# Patient Record
Sex: Female | Born: 1935
Health system: Southern US, Community
[De-identification: ages and names within clinical notes are randomized; demographics above are authoritative.]

## PROBLEM LIST (undated history)

## (undated) DIAGNOSIS — H9190 Unspecified hearing loss, unspecified ear: Secondary | ICD-10-CM

## (undated) DIAGNOSIS — R000 Tachycardia, unspecified: Secondary | ICD-10-CM

## (undated) DIAGNOSIS — C50919 Malignant neoplasm of unspecified site of unspecified female breast: Secondary | ICD-10-CM

## (undated) DIAGNOSIS — E559 Vitamin D deficiency, unspecified: Secondary | ICD-10-CM

## (undated) DIAGNOSIS — Z9221 Personal history of antineoplastic chemotherapy: Secondary | ICD-10-CM

## (undated) DIAGNOSIS — F32A Depression, unspecified: Secondary | ICD-10-CM

## (undated) DIAGNOSIS — E039 Hypothyroidism, unspecified: Secondary | ICD-10-CM

## (undated) DIAGNOSIS — Z923 Personal history of irradiation: Secondary | ICD-10-CM

## (undated) DIAGNOSIS — Z87898 Personal history of other specified conditions: Secondary | ICD-10-CM

## (undated) DIAGNOSIS — F0390 Unspecified dementia without behavioral disturbance: Secondary | ICD-10-CM

## (undated) DIAGNOSIS — C8590 Non-Hodgkin lymphoma, unspecified, unspecified site: Secondary | ICD-10-CM

## (undated) DIAGNOSIS — E785 Hyperlipidemia, unspecified: Secondary | ICD-10-CM

## (undated) DIAGNOSIS — Z171 Estrogen receptor negative status [ER-]: Secondary | ICD-10-CM

## (undated) DIAGNOSIS — K5900 Constipation, unspecified: Secondary | ICD-10-CM

## (undated) DIAGNOSIS — M8580 Other specified disorders of bone density and structure, unspecified site: Secondary | ICD-10-CM

## (undated) DIAGNOSIS — M810 Age-related osteoporosis without current pathological fracture: Secondary | ICD-10-CM

## (undated) DIAGNOSIS — F329 Major depressive disorder, single episode, unspecified: Secondary | ICD-10-CM

## (undated) DIAGNOSIS — Z860100 Personal history of colon polyps, unspecified: Secondary | ICD-10-CM

## (undated) DIAGNOSIS — IMO0001 Reserved for inherently not codable concepts without codable children: Secondary | ICD-10-CM

## (undated) DIAGNOSIS — D649 Anemia, unspecified: Secondary | ICD-10-CM

## (undated) DIAGNOSIS — H269 Unspecified cataract: Secondary | ICD-10-CM

## (undated) DIAGNOSIS — M1711 Unilateral primary osteoarthritis, right knee: Secondary | ICD-10-CM

## (undated) DIAGNOSIS — R351 Nocturia: Secondary | ICD-10-CM

## (undated) DIAGNOSIS — C8591 Non-Hodgkin lymphoma, unspecified, lymph nodes of head, face, and neck: Secondary | ICD-10-CM

## (undated) DIAGNOSIS — D693 Immune thrombocytopenic purpura: Secondary | ICD-10-CM

## (undated) DIAGNOSIS — M2550 Pain in unspecified joint: Secondary | ICD-10-CM

## (undated) DIAGNOSIS — Z8659 Personal history of other mental and behavioral disorders: Secondary | ICD-10-CM

## (undated) DIAGNOSIS — M2540 Effusion, unspecified joint: Secondary | ICD-10-CM

## (undated) DIAGNOSIS — I10 Essential (primary) hypertension: Secondary | ICD-10-CM

## (undated) DIAGNOSIS — G629 Polyneuropathy, unspecified: Secondary | ICD-10-CM

## (undated) DIAGNOSIS — J449 Chronic obstructive pulmonary disease, unspecified: Secondary | ICD-10-CM

## (undated) DIAGNOSIS — F419 Anxiety disorder, unspecified: Secondary | ICD-10-CM

## (undated) DIAGNOSIS — K429 Umbilical hernia without obstruction or gangrene: Secondary | ICD-10-CM

## (undated) DIAGNOSIS — I700 Atherosclerosis of aorta: Secondary | ICD-10-CM

## (undated) DIAGNOSIS — K219 Gastro-esophageal reflux disease without esophagitis: Secondary | ICD-10-CM

## (undated) DIAGNOSIS — E079 Disorder of thyroid, unspecified: Secondary | ICD-10-CM

## (undated) HISTORY — PX: BREAST SURGERY: SHX581

## (undated) HISTORY — DX: Hyperlipidemia, unspecified: E78.5

## (undated) HISTORY — PX: TURBINATE REDUCTION: SHX6157

## (undated) HISTORY — DX: Hypothyroidism, unspecified: E03.9

## (undated) HISTORY — PX: OTHER SURGICAL HISTORY: SHX169

## (undated) HISTORY — PX: ADENOIDECTOMY: SUR15

## (undated) HISTORY — PX: FOOT SURGERY: SHX648

## (undated) HISTORY — PX: TONSILLECTOMY: SUR1361

## (undated) HISTORY — PX: UPPER GASTROINTESTINAL ENDOSCOPY: SHX188

## (undated) HISTORY — DX: Umbilical hernia without obstruction or gangrene: K42.9

## (undated) HISTORY — DX: Malignant neoplasm of unspecified site of unspecified female breast: C50.919

## (undated) HISTORY — DX: Unilateral primary osteoarthritis, right knee: M17.11

## (undated) HISTORY — PX: BUNIONECTOMY: SHX129

## (undated) HISTORY — DX: Immune thrombocytopenic purpura: D69.3

## (undated) HISTORY — DX: Anemia, unspecified: D64.9

## (undated) HISTORY — PX: TRANSURETHRAL RESECTION OF BLADDER TUMOR WITH GYRUS (TURBT-GYRUS): SHX6458

## (undated) HISTORY — DX: Estrogen receptor negative status (ER-): Z17.1

## (undated) HISTORY — PX: FRACTURE SURGERY: SHX138

## (undated) HISTORY — PX: COLONOSCOPY: SHX174

## (undated) HISTORY — DX: Non-Hodgkin lymphoma, unspecified, lymph nodes of head, face, and neck: C85.91

## (undated) HISTORY — DX: Polyneuropathy, unspecified: G62.9

## (undated) HISTORY — DX: Vitamin D deficiency, unspecified: E55.9

## (undated) HISTORY — DX: Unspecified dementia, unspecified severity, without behavioral disturbance, psychotic disturbance, mood disturbance, and anxiety: F03.90

## (undated) HISTORY — DX: Atherosclerosis of aorta: I70.0

## (undated) HISTORY — PX: KNEE SURGERY: SHX244

---

## 1992-05-04 HISTORY — PX: BREAST LUMPECTOMY: SHX2

## 2002-06-27 ENCOUNTER — Other Ambulatory Visit: Admission: RE | Admit: 2002-06-27 | Discharge: 2002-06-27 | Payer: Self-pay | Admitting: Obstetrics and Gynecology

## 2002-10-23 ENCOUNTER — Encounter: Payer: Self-pay | Admitting: Oncology

## 2002-10-23 ENCOUNTER — Encounter: Admission: RE | Admit: 2002-10-23 | Discharge: 2002-10-23 | Payer: Self-pay | Admitting: Oncology

## 2003-02-09 ENCOUNTER — Encounter: Payer: Self-pay | Admitting: Oncology

## 2003-02-09 ENCOUNTER — Encounter: Admission: RE | Admit: 2003-02-09 | Discharge: 2003-02-09 | Payer: Self-pay | Admitting: Oncology

## 2003-03-12 ENCOUNTER — Encounter: Admission: RE | Admit: 2003-03-12 | Discharge: 2003-03-12 | Payer: Self-pay | Admitting: Oncology

## 2003-05-18 ENCOUNTER — Ambulatory Visit (HOSPITAL_COMMUNITY): Admission: RE | Admit: 2003-05-18 | Discharge: 2003-05-18 | Payer: Self-pay | Admitting: Internal Medicine

## 2003-08-16 ENCOUNTER — Encounter: Admission: RE | Admit: 2003-08-16 | Discharge: 2003-08-16 | Payer: Self-pay | Admitting: Oncology

## 2003-10-09 ENCOUNTER — Other Ambulatory Visit: Admission: RE | Admit: 2003-10-09 | Discharge: 2003-10-09 | Payer: Self-pay | Admitting: Obstetrics and Gynecology

## 2004-03-13 ENCOUNTER — Encounter: Admission: RE | Admit: 2004-03-13 | Discharge: 2004-03-13 | Payer: Self-pay | Admitting: Oncology

## 2004-03-25 ENCOUNTER — Ambulatory Visit: Payer: Self-pay | Admitting: Oncology

## 2004-05-02 ENCOUNTER — Encounter: Admission: RE | Admit: 2004-05-02 | Discharge: 2004-05-02 | Payer: Self-pay | Admitting: Family Medicine

## 2004-07-01 ENCOUNTER — Ambulatory Visit: Payer: Self-pay | Admitting: Oncology

## 2004-10-06 ENCOUNTER — Ambulatory Visit: Payer: Self-pay | Admitting: Oncology

## 2004-10-21 ENCOUNTER — Ambulatory Visit (HOSPITAL_COMMUNITY): Admission: RE | Admit: 2004-10-21 | Discharge: 2004-10-21 | Payer: Self-pay | Admitting: Oncology

## 2005-02-03 ENCOUNTER — Encounter: Admission: RE | Admit: 2005-02-03 | Discharge: 2005-02-03 | Payer: Self-pay | Admitting: Family Medicine

## 2005-03-16 ENCOUNTER — Encounter: Admission: RE | Admit: 2005-03-16 | Discharge: 2005-03-16 | Payer: Self-pay | Admitting: Oncology

## 2005-04-14 ENCOUNTER — Ambulatory Visit: Payer: Self-pay | Admitting: Oncology

## 2005-10-08 ENCOUNTER — Ambulatory Visit: Payer: Self-pay | Admitting: Oncology

## 2005-10-12 LAB — CBC WITH DIFFERENTIAL/PLATELET
BASO%: 0.3 % (ref 0.0–2.0)
EOS%: 2.4 % (ref 0.0–7.0)
HCT: 37.7 % (ref 34.8–46.6)
LYMPH%: 26.9 % (ref 14.0–48.0)
MCH: 30.1 pg (ref 26.0–34.0)
MCHC: 33.7 g/dL (ref 32.0–36.0)
MCV: 89.3 fL (ref 81.0–101.0)
MONO%: 3.9 % (ref 0.0–13.0)
NEUT%: 66.5 % (ref 39.6–76.8)
Platelets: 74 10*3/uL — ABNORMAL LOW (ref 145–400)
RBC: 4.22 10*6/uL (ref 3.70–5.32)

## 2005-10-12 LAB — COMPREHENSIVE METABOLIC PANEL
ALT: 14 U/L (ref 0–40)
BUN: 12 mg/dL (ref 6–23)
CO2: 23 mEq/L (ref 19–32)
Calcium: 9.5 mg/dL (ref 8.4–10.5)
Chloride: 106 mEq/L (ref 96–112)
Creatinine, Ser: 0.75 mg/dL (ref 0.40–1.20)
Glucose, Bld: 101 mg/dL — ABNORMAL HIGH (ref 70–99)

## 2005-10-12 LAB — LACTATE DEHYDROGENASE: LDH: 154 U/L (ref 94–250)

## 2005-10-15 ENCOUNTER — Ambulatory Visit (HOSPITAL_COMMUNITY): Admission: RE | Admit: 2005-10-15 | Discharge: 2005-10-15 | Payer: Self-pay | Admitting: Oncology

## 2005-12-10 ENCOUNTER — Ambulatory Visit: Payer: Self-pay | Admitting: Oncology

## 2005-12-14 LAB — CBC WITH DIFFERENTIAL/PLATELET
BASO%: 0.2 % (ref 0.0–2.0)
EOS%: 1.9 % (ref 0.0–7.0)
Eosinophils Absolute: 0.1 10*3/uL (ref 0.0–0.5)
MCV: 89.8 fL (ref 81.0–101.0)
MONO%: 4.5 % (ref 0.0–13.0)
NEUT#: 5 10*3/uL (ref 1.5–6.5)
RBC: 4.24 10*6/uL (ref 3.70–5.32)
RDW: 14 % (ref 11.3–14.5)

## 2005-12-14 LAB — MORPHOLOGY: RBC Comments: NORMAL

## 2006-02-05 ENCOUNTER — Ambulatory Visit: Payer: Self-pay | Admitting: Oncology

## 2006-03-17 ENCOUNTER — Encounter: Admission: RE | Admit: 2006-03-17 | Discharge: 2006-03-17 | Payer: Self-pay | Admitting: Oncology

## 2006-04-05 ENCOUNTER — Encounter: Admission: RE | Admit: 2006-04-05 | Discharge: 2006-04-05 | Payer: Self-pay | Admitting: Family Medicine

## 2006-04-09 ENCOUNTER — Ambulatory Visit: Payer: Self-pay | Admitting: Oncology

## 2006-04-13 ENCOUNTER — Ambulatory Visit (HOSPITAL_COMMUNITY): Admission: RE | Admit: 2006-04-13 | Discharge: 2006-04-13 | Payer: Self-pay | Admitting: Oncology

## 2006-04-13 LAB — COMPREHENSIVE METABOLIC PANEL
AST: 19 U/L (ref 0–37)
Albumin: 3.9 g/dL (ref 3.5–5.2)
BUN: 10 mg/dL (ref 6–23)
Calcium: 9.4 mg/dL (ref 8.4–10.5)
Chloride: 104 mEq/L (ref 96–112)
Glucose, Bld: 100 mg/dL — ABNORMAL HIGH (ref 70–99)
Potassium: 3.7 mEq/L (ref 3.5–5.3)

## 2006-04-13 LAB — CBC WITH DIFFERENTIAL/PLATELET
Basophils Absolute: 0 10*3/uL (ref 0.0–0.1)
EOS%: 1.1 % (ref 0.0–7.0)
HCT: 38.6 % (ref 34.8–46.6)
HGB: 13.1 g/dL (ref 11.6–15.9)
LYMPH%: 13.9 % — ABNORMAL LOW (ref 14.0–48.0)
MCH: 30.3 pg (ref 26.0–34.0)
MCV: 89.2 fL (ref 81.0–101.0)
MONO%: 4.1 % (ref 0.0–13.0)
NEUT%: 80.7 % — ABNORMAL HIGH (ref 39.6–76.8)
Platelets: 139 10*3/uL — ABNORMAL LOW (ref 145–400)

## 2006-04-13 LAB — MORPHOLOGY: PLT EST: ADEQUATE

## 2006-07-15 ENCOUNTER — Ambulatory Visit: Payer: Self-pay | Admitting: Oncology

## 2006-07-20 LAB — CBC WITH DIFFERENTIAL/PLATELET
BASO%: 0.5 % (ref 0.0–2.0)
HCT: 36.4 % (ref 34.8–46.6)
MCHC: 34.5 g/dL (ref 32.0–36.0)
MONO#: 0.4 10*3/uL (ref 0.1–0.9)
NEUT#: 3.7 10*3/uL (ref 1.5–6.5)
NEUT%: 61 % (ref 39.6–76.8)
WBC: 6 10*3/uL (ref 3.9–10.0)
lymph#: 1.7 10*3/uL (ref 0.9–3.3)

## 2006-07-20 LAB — MORPHOLOGY

## 2006-09-08 ENCOUNTER — Encounter: Admission: RE | Admit: 2006-09-08 | Discharge: 2006-09-08 | Payer: Self-pay | Admitting: Oncology

## 2006-09-22 ENCOUNTER — Encounter: Admission: RE | Admit: 2006-09-22 | Discharge: 2006-09-22 | Payer: Self-pay | Admitting: Family Medicine

## 2006-10-04 ENCOUNTER — Encounter: Admission: RE | Admit: 2006-10-04 | Discharge: 2006-10-04 | Payer: Self-pay | Admitting: Family Medicine

## 2006-10-07 ENCOUNTER — Ambulatory Visit: Payer: Self-pay | Admitting: Oncology

## 2006-10-12 LAB — CBC WITH DIFFERENTIAL/PLATELET
Eosinophils Absolute: 0.1 10*3/uL (ref 0.0–0.5)
LYMPH%: 23.1 % (ref 14.0–48.0)
MONO#: 0.4 10*3/uL (ref 0.1–0.9)
NEUT#: 8 10*3/uL — ABNORMAL HIGH (ref 1.5–6.5)
Platelets: 267 10*3/uL (ref 145–400)
RBC: 4.34 10*6/uL (ref 3.70–5.32)
WBC: 11.2 10*3/uL — ABNORMAL HIGH (ref 3.9–10.0)
lymph#: 2.6 10*3/uL (ref 0.9–3.3)

## 2006-10-12 LAB — MORPHOLOGY
PLT EST: ADEQUATE
RBC Comments: NORMAL

## 2006-10-12 LAB — COMPREHENSIVE METABOLIC PANEL
ALT: 23 U/L (ref 0–35)
AST: 15 U/L (ref 0–37)
Albumin: 4.5 g/dL (ref 3.5–5.2)
Alkaline Phosphatase: 50 U/L (ref 39–117)
Glucose, Bld: 98 mg/dL (ref 70–99)
Potassium: 4.1 mEq/L (ref 3.5–5.3)
Sodium: 139 mEq/L (ref 135–145)
Total Bilirubin: 1.3 mg/dL — ABNORMAL HIGH (ref 0.3–1.2)
Total Protein: 7.1 g/dL (ref 6.0–8.3)

## 2006-10-13 ENCOUNTER — Ambulatory Visit (HOSPITAL_COMMUNITY): Admission: RE | Admit: 2006-10-13 | Discharge: 2006-10-13 | Payer: Self-pay | Admitting: Oncology

## 2006-10-25 ENCOUNTER — Encounter: Admission: RE | Admit: 2006-10-25 | Discharge: 2006-10-25 | Payer: Self-pay | Admitting: Family Medicine

## 2006-11-23 ENCOUNTER — Encounter: Admission: RE | Admit: 2006-11-23 | Discharge: 2006-11-23 | Payer: Self-pay | Admitting: Family Medicine

## 2006-12-01 ENCOUNTER — Encounter: Admission: RE | Admit: 2006-12-01 | Discharge: 2006-12-01 | Payer: Self-pay | Admitting: Family Medicine

## 2007-03-02 ENCOUNTER — Encounter: Admission: RE | Admit: 2007-03-02 | Discharge: 2007-03-02 | Payer: Self-pay | Admitting: Interventional Radiology

## 2007-04-07 ENCOUNTER — Ambulatory Visit: Payer: Self-pay | Admitting: Oncology

## 2007-04-11 LAB — COMPREHENSIVE METABOLIC PANEL
ALT: 14 U/L (ref 0–35)
Albumin: 4.2 g/dL (ref 3.5–5.2)
CO2: 27 mEq/L (ref 19–32)
Calcium: 10 mg/dL (ref 8.4–10.5)
Chloride: 105 mEq/L (ref 96–112)
Creatinine, Ser: 0.64 mg/dL (ref 0.40–1.20)
Potassium: 4.3 mEq/L (ref 3.5–5.3)
Total Protein: 6.8 g/dL (ref 6.0–8.3)

## 2007-04-11 LAB — LACTATE DEHYDROGENASE: LDH: 146 U/L (ref 94–250)

## 2007-04-11 LAB — CBC WITH DIFFERENTIAL/PLATELET
BASO%: 0.3 % (ref 0.0–2.0)
HCT: 36.6 % (ref 34.8–46.6)
LYMPH%: 27.9 % (ref 14.0–48.0)
MCH: 30.9 pg (ref 26.0–34.0)
MCHC: 34.7 g/dL (ref 32.0–36.0)
MCV: 88.9 fL (ref 81.0–101.0)
MONO%: 5.1 % (ref 0.0–13.0)
NEUT%: 63.9 % (ref 39.6–76.8)
Platelets: 207 10*3/uL (ref 145–400)
RBC: 4.11 10*6/uL (ref 3.70–5.32)

## 2007-04-11 LAB — MORPHOLOGY: PLT EST: ADEQUATE

## 2007-04-13 ENCOUNTER — Ambulatory Visit (HOSPITAL_COMMUNITY): Admission: RE | Admit: 2007-04-13 | Discharge: 2007-04-13 | Payer: Self-pay | Admitting: Oncology

## 2007-07-15 ENCOUNTER — Ambulatory Visit: Payer: Self-pay | Admitting: Oncology

## 2007-09-09 ENCOUNTER — Encounter: Admission: RE | Admit: 2007-09-09 | Discharge: 2007-09-09 | Payer: Self-pay | Admitting: Oncology

## 2007-10-19 ENCOUNTER — Ambulatory Visit: Payer: Self-pay | Admitting: Oncology

## 2007-10-21 LAB — CBC WITH DIFFERENTIAL/PLATELET
Basophils Absolute: 0 10*3/uL (ref 0.0–0.1)
EOS%: 2.3 % (ref 0.0–7.0)
Eosinophils Absolute: 0.2 10*3/uL (ref 0.0–0.5)
HCT: 36.8 % (ref 34.8–46.6)
HGB: 12.7 g/dL (ref 11.6–15.9)
MCH: 30.9 pg (ref 26.0–34.0)
MCV: 89.9 fL (ref 81.0–101.0)
MONO%: 5.3 % (ref 0.0–13.0)
NEUT#: 4.9 10*3/uL (ref 1.5–6.5)
NEUT%: 64.2 % (ref 39.6–76.8)
RDW: 13 % (ref 11.3–14.5)
lymph#: 2.1 10*3/uL (ref 0.9–3.3)

## 2007-10-21 LAB — COMPREHENSIVE METABOLIC PANEL
ALT: 16 U/L (ref 0–35)
AST: 13 U/L (ref 0–37)
Albumin: 4.1 g/dL (ref 3.5–5.2)
CO2: 24 mEq/L (ref 19–32)
Calcium: 9.9 mg/dL (ref 8.4–10.5)
Chloride: 108 mEq/L (ref 96–112)
Creatinine, Ser: 0.59 mg/dL (ref 0.40–1.20)
Potassium: 4.5 mEq/L (ref 3.5–5.3)

## 2007-10-21 LAB — MORPHOLOGY: PLT EST: ADEQUATE

## 2007-10-21 LAB — LACTATE DEHYDROGENASE: LDH: 144 U/L (ref 94–250)

## 2008-02-22 ENCOUNTER — Ambulatory Visit: Payer: Self-pay | Admitting: Oncology

## 2008-02-24 LAB — COMPREHENSIVE METABOLIC PANEL
AST: 14 U/L (ref 0–37)
Alkaline Phosphatase: 55 U/L (ref 39–117)
Creatinine, Ser: 0.71 mg/dL (ref 0.40–1.20)
Glucose, Bld: 115 mg/dL — ABNORMAL HIGH (ref 70–99)
Potassium: 4.2 mEq/L (ref 3.5–5.3)
Total Bilirubin: 0.8 mg/dL (ref 0.3–1.2)
Total Protein: 6.5 g/dL (ref 6.0–8.3)

## 2008-02-24 LAB — CBC WITH DIFFERENTIAL/PLATELET
Basophils Absolute: 0 10*3/uL (ref 0.0–0.1)
EOS%: 2.8 % (ref 0.0–7.0)
HCT: 36.4 % (ref 34.8–46.6)
HGB: 12.4 g/dL (ref 11.6–15.9)
MCH: 31.1 pg (ref 26.0–34.0)
MCHC: 34.2 g/dL (ref 32.0–36.0)
MCV: 90.9 fL (ref 81.0–101.0)
MONO#: 0.5 10*3/uL (ref 0.1–0.9)
NEUT#: 5.1 10*3/uL (ref 1.5–6.5)
WBC: 8.1 10*3/uL (ref 3.9–10.0)

## 2008-06-22 ENCOUNTER — Ambulatory Visit: Payer: Self-pay | Admitting: Oncology

## 2008-06-26 LAB — COMPREHENSIVE METABOLIC PANEL
ALT: 14 U/L (ref 0–35)
Alkaline Phosphatase: 53 U/L (ref 39–117)
Chloride: 107 mEq/L (ref 96–112)
Creatinine, Ser: 0.64 mg/dL (ref 0.40–1.20)
Glucose, Bld: 104 mg/dL — ABNORMAL HIGH (ref 70–99)
Potassium: 4.5 mEq/L (ref 3.5–5.3)
Total Bilirubin: 0.7 mg/dL (ref 0.3–1.2)

## 2008-06-26 LAB — CBC WITH DIFFERENTIAL/PLATELET
BASO%: 0.4 % (ref 0.0–2.0)
Basophils Absolute: 0 10*3/uL (ref 0.0–0.1)
EOS%: 2.2 % (ref 0.0–7.0)
Eosinophils Absolute: 0.2 10*3/uL (ref 0.0–0.5)
LYMPH%: 26.9 % (ref 14.0–49.7)
MONO#: 0.4 10*3/uL (ref 0.1–0.9)
MONO%: 4.3 % (ref 0.0–14.0)

## 2008-06-26 LAB — LACTATE DEHYDROGENASE: LDH: 153 U/L (ref 94–250)

## 2008-09-10 ENCOUNTER — Encounter: Admission: RE | Admit: 2008-09-10 | Discharge: 2008-09-10 | Payer: Self-pay | Admitting: Oncology

## 2009-01-01 ENCOUNTER — Ambulatory Visit: Payer: Self-pay | Admitting: Oncology

## 2009-01-03 LAB — CBC WITH DIFFERENTIAL/PLATELET
HCT: 37.6 % (ref 34.8–46.6)
HGB: 12.8 g/dL (ref 11.6–15.9)
MCH: 31 pg (ref 25.1–34.0)
MCV: 91.3 fL (ref 79.5–101.0)
MONO#: 0.3 10*3/uL (ref 0.1–0.9)
MONO%: 4.2 % (ref 0.0–14.0)
WBC: 6.6 10*3/uL (ref 3.9–10.3)
lymph#: 2 10*3/uL (ref 0.9–3.3)

## 2009-06-28 ENCOUNTER — Ambulatory Visit: Payer: Self-pay | Admitting: Oncology

## 2009-07-02 LAB — CBC WITH DIFFERENTIAL/PLATELET
EOS%: 2.6 % (ref 0.0–7.0)
Eosinophils Absolute: 0.2 10*3/uL (ref 0.0–0.5)
HCT: 37.9 % (ref 34.8–46.6)
LYMPH%: 29.3 % (ref 14.0–49.7)
MCHC: 33 g/dL (ref 31.5–36.0)
MONO%: 6.5 % (ref 0.0–14.0)
NEUT%: 61.1 % (ref 38.4–76.8)
Platelets: 218 10*3/uL (ref 145–400)
RDW: 13.3 % (ref 11.2–14.5)
WBC: 8.1 10*3/uL (ref 3.9–10.3)
nRBC: 0 % (ref 0–0)

## 2009-07-30 ENCOUNTER — Encounter: Admission: RE | Admit: 2009-07-30 | Discharge: 2009-07-30 | Payer: Self-pay | Admitting: Obstetrics and Gynecology

## 2009-09-12 ENCOUNTER — Encounter: Admission: RE | Admit: 2009-09-12 | Discharge: 2009-09-12 | Payer: Self-pay | Admitting: Oncology

## 2009-09-24 ENCOUNTER — Ambulatory Visit (HOSPITAL_COMMUNITY): Admission: RE | Admit: 2009-09-24 | Discharge: 2009-09-24 | Payer: Self-pay | Admitting: Obstetrics and Gynecology

## 2010-07-01 ENCOUNTER — Other Ambulatory Visit: Payer: Self-pay | Admitting: Oncology

## 2010-07-01 ENCOUNTER — Encounter (HOSPITAL_BASED_OUTPATIENT_CLINIC_OR_DEPARTMENT_OTHER): Payer: Medicare Other | Admitting: Oncology

## 2010-07-01 DIAGNOSIS — C8291 Follicular lymphoma, unspecified, lymph nodes of head, face, and neck: Secondary | ICD-10-CM

## 2010-07-01 DIAGNOSIS — Z853 Personal history of malignant neoplasm of breast: Secondary | ICD-10-CM

## 2010-07-01 DIAGNOSIS — D693 Immune thrombocytopenic purpura: Secondary | ICD-10-CM

## 2010-07-01 LAB — CBC WITH DIFFERENTIAL/PLATELET
Basophils Absolute: 0 10*3/uL (ref 0.0–0.1)
EOS%: 2.5 % (ref 0.0–7.0)
HCT: 36 % (ref 34.8–46.6)
HGB: 12.3 g/dL (ref 11.6–15.9)
LYMPH%: 23.9 % (ref 14.0–49.7)
MCHC: 34.2 g/dL (ref 31.5–36.0)
NEUT#: 5.4 10*3/uL (ref 1.5–6.5)
RBC: 4.07 10*6/uL (ref 3.70–5.45)
lymph#: 1.9 10*3/uL (ref 0.9–3.3)

## 2010-07-01 LAB — COMPREHENSIVE METABOLIC PANEL
Alkaline Phosphatase: 48 U/L (ref 39–117)
Calcium: 9.8 mg/dL (ref 8.4–10.5)
Creatinine, Ser: 0.59 mg/dL (ref 0.40–1.20)
Glucose, Bld: 102 mg/dL — ABNORMAL HIGH (ref 70–99)
Potassium: 3.8 mEq/L (ref 3.5–5.3)
Sodium: 139 mEq/L (ref 135–145)
Total Protein: 6.5 g/dL (ref 6.0–8.3)

## 2010-07-08 ENCOUNTER — Encounter (HOSPITAL_BASED_OUTPATIENT_CLINIC_OR_DEPARTMENT_OTHER): Payer: Medicare Other | Admitting: Oncology

## 2010-07-08 ENCOUNTER — Other Ambulatory Visit: Payer: Self-pay | Admitting: Oncology

## 2010-07-08 DIAGNOSIS — C8291 Follicular lymphoma, unspecified, lymph nodes of head, face, and neck: Secondary | ICD-10-CM

## 2010-07-08 DIAGNOSIS — Z853 Personal history of malignant neoplasm of breast: Secondary | ICD-10-CM

## 2010-07-08 DIAGNOSIS — Z1231 Encounter for screening mammogram for malignant neoplasm of breast: Secondary | ICD-10-CM

## 2010-07-08 DIAGNOSIS — D693 Immune thrombocytopenic purpura: Secondary | ICD-10-CM

## 2010-09-18 ENCOUNTER — Ambulatory Visit
Admission: RE | Admit: 2010-09-18 | Discharge: 2010-09-18 | Disposition: A | Discharge status: Home / Self Care | Payer: Medicare Other | Source: Ambulatory Visit | Attending: Oncology | Admitting: Oncology

## 2010-09-18 DIAGNOSIS — Z1231 Encounter for screening mammogram for malignant neoplasm of breast: Secondary | ICD-10-CM

## 2010-10-28 ENCOUNTER — Ambulatory Visit (HOSPITAL_COMMUNITY): Payer: Medicare Other | Attending: Obstetrics

## 2010-10-28 DIAGNOSIS — M810 Age-related osteoporosis without current pathological fracture: Secondary | ICD-10-CM | POA: Insufficient documentation

## 2011-04-13 ENCOUNTER — Encounter: Payer: Self-pay | Admitting: *Deleted

## 2011-04-13 ENCOUNTER — Emergency Department (HOSPITAL_COMMUNITY): Payer: Medicare Other

## 2011-04-13 ENCOUNTER — Emergency Department (HOSPITAL_COMMUNITY)
Admission: EM | Admit: 2011-04-13 | Discharge: 2011-04-13 | Disposition: A | Discharge status: Home / Self Care | Payer: Medicare Other | Attending: Emergency Medicine | Admitting: Emergency Medicine

## 2011-04-13 ENCOUNTER — Other Ambulatory Visit: Payer: Self-pay

## 2011-04-13 DIAGNOSIS — R0602 Shortness of breath: Secondary | ICD-10-CM | POA: Insufficient documentation

## 2011-04-13 DIAGNOSIS — Z7982 Long term (current) use of aspirin: Secondary | ICD-10-CM | POA: Insufficient documentation

## 2011-04-13 DIAGNOSIS — R079 Chest pain, unspecified: Secondary | ICD-10-CM | POA: Insufficient documentation

## 2011-04-13 DIAGNOSIS — M542 Cervicalgia: Secondary | ICD-10-CM | POA: Insufficient documentation

## 2011-04-13 DIAGNOSIS — Z79899 Other long term (current) drug therapy: Secondary | ICD-10-CM | POA: Insufficient documentation

## 2011-04-13 DIAGNOSIS — K209 Esophagitis, unspecified without bleeding: Secondary | ICD-10-CM | POA: Insufficient documentation

## 2011-04-13 DIAGNOSIS — Z853 Personal history of malignant neoplasm of breast: Secondary | ICD-10-CM | POA: Insufficient documentation

## 2011-04-13 HISTORY — DX: Disorder of thyroid, unspecified: E07.9

## 2011-04-13 HISTORY — DX: Non-Hodgkin lymphoma, unspecified, unspecified site: C85.90

## 2011-04-13 HISTORY — DX: Malignant neoplasm of unspecified site of unspecified female breast: C50.919

## 2011-04-13 LAB — CBC
HCT: 37 % (ref 36.0–46.0)
Hemoglobin: 12.3 g/dL (ref 12.0–15.0)
MCH: 30 pg (ref 26.0–34.0)
MCV: 90.2 fL (ref 78.0–100.0)
RBC: 4.1 MIL/uL (ref 3.87–5.11)
WBC: 7.9 10*3/uL (ref 4.0–10.5)

## 2011-04-13 LAB — COMPREHENSIVE METABOLIC PANEL
ALT: 15 U/L (ref 0–35)
BUN: 18 mg/dL (ref 6–23)
CO2: 25 mEq/L (ref 19–32)
Calcium: 10.4 mg/dL (ref 8.4–10.5)
Creatinine, Ser: 0.59 mg/dL (ref 0.50–1.10)
GFR calc Af Amer: >90 mL/min (ref >90)
GFR calc non Af Amer: 88 mL/min — ABNORMAL LOW (ref >90)
Glucose, Bld: 104 mg/dL — ABNORMAL HIGH (ref 70–99)
Sodium: 139 mEq/L (ref 135–145)

## 2011-04-13 LAB — APTT: aPTT: 25 seconds (ref 24–37)

## 2011-04-13 LAB — DIFFERENTIAL
Eosinophils Relative: 3 % (ref 0–5)
Lymphocytes Relative: 34 % (ref 12–46)
Lymphs Abs: 2.7 10*3/uL (ref 0.7–4.0)
Monocytes Absolute: 0.4 10*3/uL (ref 0.1–1.0)
Monocytes Relative: 6 % (ref 3–12)

## 2011-04-13 LAB — TROPONIN I: Troponin I: <0.3 ng/mL (ref <0.30)

## 2011-04-13 LAB — CK TOTAL AND CKMB (NOT AT ARMC): CK, MB: 3.6 ng/mL (ref 0.3–4.0)

## 2011-04-13 MED ORDER — IOHEXOL 350 MG/ML SOLN
100.0000 mL | Freq: Once | INTRAVENOUS | Status: AC | PRN
  Administered 2011-04-13: 100 mL via INTRAVENOUS

## 2011-04-13 MED ORDER — NITROGLYCERIN 2 % TD OINT
1.0000 [in_us] | TOPICAL_OINTMENT | Freq: Four times a day (QID) | TRANSDERMAL | Status: DC
  Filled 2011-04-13: qty 1

## 2011-04-13 MED ORDER — SODIUM CHLORIDE 0.9 % IV SOLN
20.0000 mL | INTRAVENOUS | Status: DC
  Administered 2011-04-13: 1000 mL via INTRAVENOUS

## 2011-04-13 MED ORDER — SUCRALFATE 1 G PO TABS: 1.0000 g | ORAL_TABLET | Freq: Four times a day (QID) | ORAL | Status: DC

## 2011-04-13 MED ORDER — ASPIRIN 81 MG PO CHEW
324.0000 mg | CHEWABLE_TABLET | Freq: Once | ORAL | Status: AC
  Administered 2011-04-13: 324 mg via ORAL
  Filled 2011-04-13: qty 4

## 2011-04-13 NOTE — ED Provider Notes (Signed)
History     CSN: [id number] Arrival date & time: 04/13/2011  7:59 PM   First MD Initiated Contact with Patient 04/13/11 2035      Chief Complaint  Patient presents with  . Chest Pain   HPI Patient presents to the emergency room with complaints of chest discomfort. Patient states she had surgery on her foot on Friday. She did notice after that she had some type of reaction to the medication she developed some facial swelling. That has resolved but she has noticed she has had a discomfort in her chest and neck. She feels as if there is a constriction  around her neck as if she is wearing a turtle neck. The patient does feel a little bit short of breath. She has not noticed any significant leg swelling. The discomfort does not radiate anywhere. It has been somewhat constant since yesterday. Patient does not have history of heart disease or pulmonary embolism. Past Medical History  Diagnosis Date  . Thyroid disease   . Cancer of breast   . Lymphoma     History reviewed. No pertinent past surgical history.  History reviewed. No pertinent family history.  History  Substance Use Topics  . Smoking status: Former Games developer  . Smokeless tobacco: Not on file  . Alcohol Use: Yes    OB History    Grav Para Term Preterm Abortions TAB SAB Ect Mult Living                  Review of Systems  All other systems reviewed and are negative.    Allergies  Codeine; Doxycycline; and Sulfa antibiotics  Home Medications   Current Outpatient Rx  Name Route Sig Dispense Refill  . ACETAMINOPHEN 500 MG PO TABS Oral Take 500 mg by mouth every 6 (six) hours as needed. For pain     . ASPIRIN 81 MG PO CHEW Oral Chew 81 mg by mouth daily.      Marland Kitchen CALCIUM CARBONATE-VITAMIN D 600-200 MG-UNIT PO TABS Oral Take 1 tablet by mouth daily.      Marland Kitchen VITAMIN D 1000 UNITS PO TABS Oral Take 1,000 Units by mouth daily.      Marland Kitchen CITALOPRAM HYDROBROMIDE 10 MG PO TABS Oral Take 10 mg by mouth daily.      .  CYCLOBENZAPRINE HCL 5 MG PO TABS Oral Take 5 mg by mouth daily as needed. For pain     . ESOMEPRAZOLE MAGNESIUM 40 MG PO CPDR Oral Take 40 mg by mouth daily before breakfast.      . LEVOTHYROXINE SODIUM 75 MCG PO TABS Oral Take 75 mcg by mouth daily.      Marland Kitchen LOVASTATIN 20 MG PO TABS Oral Take 20 mg by mouth at bedtime.      . MELOXICAM 7.5 MG PO TABS Oral Take 7.5 mg by mouth daily. For pain     . METOPROLOL TARTRATE 25 MG PO TABS Oral Take 25 mg by mouth 2 (two) times daily.      Carma Leaven M PLUS PO TABS Oral Take 1 tablet by mouth daily.      Marland Kitchen VITAMIN D (ERGOCALCIFEROL) 50000 UNITS PO CAPS Oral Take 50,000 Units by mouth every 14 (fourteen) days.      Marland Kitchen MEPERIDINE HCL 50 MG PO TABS Oral Take 50 mg by mouth every 4 (four) hours as needed. For pain     . PROMETHAZINE HCL 25 MG PO TABS Oral Take 25 mg by mouth every 6 (six)  hours as needed. For nausea       BP 115/69  Pulse 60  Temp(Src) 97.8 F (36.6 C) (Oral)  Resp 20  SpO2 95%  Physical Exam  Nursing note and vitals reviewed. Constitutional: She appears well-developed and well-nourished. No distress.  HENT:  Head: Normocephalic and atraumatic.  Right Ear: External ear normal.  Left Ear: External ear normal.  Eyes: Conjunctivae are normal. Right eye exhibits no discharge. Left eye exhibits no discharge. No scleral icterus.  Neck: Neck supple. No tracheal deviation present.  Cardiovascular: Normal rate, regular rhythm and intact distal pulses.   Pulmonary/Chest: Effort normal and breath sounds normal. No stridor. No respiratory distress. She has no wheezes. She has no rales.  Abdominal: Soft. Bowel sounds are normal. She exhibits no distension. There is no tenderness. There is no rebound and no guarding.  Musculoskeletal: She exhibits no edema and no tenderness.  Neurological: She is alert. She has normal strength. No sensory deficit. Cranial nerve deficit:  no gross defecits noted. She exhibits normal muscle tone. She displays no  seizure activity. Coordination normal.  Skin: Skin is warm and dry. No rash noted.  Psychiatric: She has a normal mood and affect.    ED Course  Procedures (including critical care time)  Date: 04/13/2011  Rate: 64  Rhythm: normal sinus rhythm  QRS Axis: normal  Intervals: normal  ST/T Wave abnormalities: normal  Conduction Disutrbances:none  Narrative Interpretation:   Old EKG Reviewed: none available   Labs Reviewed  COMPREHENSIVE METABOLIC PANEL - Abnormal; Notable for the following:    Glucose, Bld 104 (*)    GFR calc non Af Amer 88 (*)    All other components within normal limits  CBC  DIFFERENTIAL  TROPONIN I  CK TOTAL AND CKMB  PROTIME-INR  APTT  I-STAT TROPONIN I  I-STAT TROPONIN I  I-STAT TROPONIN I   Ct Angio Chest W/cm &/or Wo Cm  04/13/2011  *RADIOLOGY REPORT*  Clinical Data:  Chest pressure, pain and tightness in the neck for 2 days.  The patient had foot surgery on Friday.  CT ANGIOGRAPHY CHEST WITH CONTRAST  Technique:  Multidetector CT imaging of the chest was performed using the standard protocol during bolus administration of intravenous contrast.  Multiplanar CT image reconstructions including MIPs were obtained to evaluate the vascular anatomy.  Contrast: OMNIPAQUE IOHEXOL 350 MG/ML IV SOLN  Comparison:  04/13/2007  Findings:  Technically adequate study with good opacification of the central and segmental pulmonary arteries.  No focal filling defects demonstrated.  No evidence of significant pulmonary embolus.  Normal caliber thoracic aorta.  Calcification in the coronary arteries.  Prominent heart size.  Small esophageal hiatal hernia with mild wall thickening in the esophagus which might represent inflammatory changes of reflux.  There are scattered bilateral supraclavicular, mediastinal, and retrocrural lymph nodes which are mildly enlarged.  Largest node measures about 12 mm diameter.  These changes have been present previously and may represent  chronic reactive nodes.  Surgical clips in the left axilla.  No pleural effusion.  No pneumothorax.  Dependent atelectasis in the lung bases.  No focal airspace consolidation. No interstitial changes.  No changes in the thoracic spine.  Review of the MIP images confirms the above findings.  IMPRESSION: No evidence of significant pulmonary embolus.  Suggestion of inflammatory changes in the esophagus.  Scattered supraclavicular, mediastinal, and retrocrural lymph nodes are mildly enlarged but appear stable since previous study.  Original Report Authenticated By: Marlon Pel, M.D.  Dg Chest Portable 1 View  04/13/2011  *RADIOLOGY REPORT*  Clinical Data: Chest pain  PORTABLE CHEST - 1 VIEW  Comparison: CT chest 10/13/2006  Findings: Mild cardiomegaly.  Tortuous thoracic aorta.  Pulmonary vascularity within normal limits.  Bilateral peribronchial thickening most prominent at the lung bases.  No consolidation or edema.  No visible pleural effusion or pneumothorax.  Surgical clips project over the right axillary region.  No acute bony abnormality identified.  IMPRESSION:  1.  Cardiomegaly without edema. 2.  Bilateral peribronchial thickening is nonspecific but can be seen in the setting of acute or chronic bronchitis.  Original Report Authenticated By: Britta Mccreedy, M.D.     1. Esophagitis       MDM  Patient has had discomfort for the last couple of days. At this point acute cardiac etiology is unlikely. She had days of symptoms and has had negative cardiac enzymes. Her EKG is reassuring. Her CT scan of the chest does  not show any signs of pulmonary embolus or dissection. she does have suggestion of inflammatory changes in the esophagus. I suspect esophagitis as the etiology of her symptoms. She does take Nexium every other day. I've instructed her to take Nexium daily and will add Carafate to her regimen.       Celene Kras, MD 04/13/11 [phone]

## 2011-04-13 NOTE — ED Notes (Signed)
Patient transported to CT 

## 2011-04-13 NOTE — ED Notes (Signed)
The pt had rt foot surgery on Friday.  Since yesterday she has had some chest discomfort and it feels like she has a stricture around her neck

## 2011-04-13 NOTE — ED Notes (Signed)
AIDET 

## 2011-05-01 ENCOUNTER — Telehealth: Payer: Self-pay | Admitting: Oncology

## 2011-05-01 NOTE — Telephone Encounter (Signed)
Talked to pt, gave her appt for lab on 07/02/11 and MD visit on 07/06/11, pt aware of appt

## 2011-06-10 DIAGNOSIS — M898X9 Other specified disorders of bone, unspecified site: Secondary | ICD-10-CM | POA: Diagnosis not present

## 2011-06-23 DIAGNOSIS — H5200 Hypermetropia, unspecified eye: Secondary | ICD-10-CM | POA: Diagnosis not present

## 2011-06-23 DIAGNOSIS — H2510 Age-related nuclear cataract, unspecified eye: Secondary | ICD-10-CM | POA: Diagnosis not present

## 2011-06-30 ENCOUNTER — Ambulatory Visit (HOSPITAL_BASED_OUTPATIENT_CLINIC_OR_DEPARTMENT_OTHER): Payer: Medicare Other | Admitting: Lab

## 2011-06-30 DIAGNOSIS — Z853 Personal history of malignant neoplasm of breast: Secondary | ICD-10-CM | POA: Diagnosis not present

## 2011-06-30 DIAGNOSIS — D693 Immune thrombocytopenic purpura: Secondary | ICD-10-CM

## 2011-06-30 DIAGNOSIS — C8291 Follicular lymphoma, unspecified, lymph nodes of head, face, and neck: Secondary | ICD-10-CM

## 2011-06-30 LAB — COMPREHENSIVE METABOLIC PANEL
ALT: 17 U/L (ref 0–35)
Alkaline Phosphatase: 55 U/L (ref 39–117)
Creatinine, Ser: 0.64 mg/dL (ref 0.50–1.10)
Glucose, Bld: 103 mg/dL — ABNORMAL HIGH (ref 70–99)
Sodium: 139 mEq/L (ref 135–145)
Total Bilirubin: 1 mg/dL (ref 0.3–1.2)
Total Protein: 6.5 g/dL (ref 6.0–8.3)

## 2011-06-30 LAB — LACTATE DEHYDROGENASE: LDH: 172 U/L (ref 94–250)

## 2011-06-30 LAB — CBC WITH DIFFERENTIAL/PLATELET
BASO%: 0.4 % (ref 0.0–2.0)
EOS%: 2.6 % (ref 0.0–7.0)
MCH: 30.3 pg (ref 25.1–34.0)
MCHC: 33.5 g/dL (ref 31.5–36.0)
MONO#: 0.4 10*3/uL (ref 0.1–0.9)
RBC: 4.23 10*6/uL (ref 3.70–5.45)
WBC: 7.3 10*3/uL (ref 3.9–10.3)
lymph#: 1.6 10*3/uL (ref 0.9–3.3)

## 2011-06-30 LAB — MORPHOLOGY: PLT EST: ADEQUATE

## 2011-07-02 ENCOUNTER — Other Ambulatory Visit: Payer: Medicare Other

## 2011-07-06 ENCOUNTER — Telehealth: Payer: Self-pay | Admitting: Oncology

## 2011-07-06 ENCOUNTER — Encounter: Payer: Self-pay | Admitting: Oncology

## 2011-07-06 ENCOUNTER — Ambulatory Visit (HOSPITAL_BASED_OUTPATIENT_CLINIC_OR_DEPARTMENT_OTHER): Payer: Medicare Other | Admitting: Oncology

## 2011-07-06 VITALS — BP 132/77 | HR 72 | Temp 97.0°F | Ht 60.0 in | Wt 170.2 lb

## 2011-07-06 DIAGNOSIS — Z9221 Personal history of antineoplastic chemotherapy: Secondary | ICD-10-CM

## 2011-07-06 DIAGNOSIS — E039 Hypothyroidism, unspecified: Secondary | ICD-10-CM | POA: Diagnosis not present

## 2011-07-06 DIAGNOSIS — Z1231 Encounter for screening mammogram for malignant neoplasm of breast: Secondary | ICD-10-CM

## 2011-07-06 DIAGNOSIS — C8581 Other specified types of non-Hodgkin lymphoma, lymph nodes of head, face, and neck: Secondary | ICD-10-CM

## 2011-07-06 DIAGNOSIS — Z853 Personal history of malignant neoplasm of breast: Secondary | ICD-10-CM

## 2011-07-06 DIAGNOSIS — K219 Gastro-esophageal reflux disease without esophagitis: Secondary | ICD-10-CM | POA: Diagnosis not present

## 2011-07-06 DIAGNOSIS — C8591 Non-Hodgkin lymphoma, unspecified, lymph nodes of head, face, and neck: Secondary | ICD-10-CM

## 2011-07-06 DIAGNOSIS — C50919 Malignant neoplasm of unspecified site of unspecified female breast: Secondary | ICD-10-CM

## 2011-07-06 DIAGNOSIS — Z8572 Personal history of non-Hodgkin lymphomas: Secondary | ICD-10-CM | POA: Insufficient documentation

## 2011-07-06 DIAGNOSIS — D693 Immune thrombocytopenic purpura: Secondary | ICD-10-CM

## 2011-07-06 HISTORY — DX: Hypothyroidism, unspecified: E03.9

## 2011-07-06 HISTORY — DX: Immune thrombocytopenic purpura: D69.3

## 2011-07-06 HISTORY — DX: Malignant neoplasm of unspecified site of unspecified female breast: C50.919

## 2011-07-06 HISTORY — DX: Non-Hodgkin lymphoma, unspecified, lymph nodes of head, face, and neck: C85.91

## 2011-07-06 NOTE — Progress Notes (Signed)
Hematology and Oncology Follow Up Visit  Darlene [name] [id number] 30-Aug-1934 77 y.o. 07/06/2011 6:29 PM   Principle Diagnosis: Encounter Diagnoses  Name Primary?  . Lymphoma of lymph nodes of head, face, and/or neck   . Breast cancer, stage 1 Yes  . Breast cancer, stage 1, estrogen receptor negative   . Hypothyroid   . Chronic ITP (idiopathic thrombocytopenia)      Interim History:    Followup visit for this 77 year old woman with a remote history of limited stage low grade B-cell non-Hodgkin's lymphoma, presenting with left supraclavicular lymphadenopathy in April 1999.  She never required any systemic treatment.  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  This has also resolved on its own.  Platelet count when she first visited me in June 2006 was 78,000, by March 2008 platelet count was up to 167,000 and remains normal since that time.  She has never developed any additional adenopathy other than the single lymph node palpable in the left supraclavicular fossa.  She did develop a second primary stage I 0.8 cm ER negative cancer of the right breast, treated with lumpectomy, radiation and six cycles of CMF chemotherapy in 1994.  No evidence of a new disease from the breast cancer either.  Most recent mammogram done 09/18/2010 showed stable fibroglandular changes in her breasts.  She has had no interval medical problems, but she is assuming more and more of the care of her husband who has Parkinson's disease and is slowly progressing.  He accompanies her today.  Medications: reviewed  Allergies:  Allergies  Allergen Reactions  . Codeine Other (See Comments)    unknown  . Doxycycline Other (See Comments)    unknown  . Sulfa Antibiotics Itching    Review of Systems: Constitutional:  none  Respiratory: no coughor dyspnea Cardiovascular: no chest pain or palpitations  Gastrointestinal:no change in bowel habit Genito-Urinary:  no vaginal bleeding Musculoskeletal:no bone pain Neurologic:no headache or change in vision Skin:no rash Remaining ROS negative.  Physical Exam: Blood pressure 132/77, pulse 72, temperature 97 F (36.1 C), temperature source Oral, height 5' (1.524 m), weight 170 lb 3.2 oz (77.202 kg). Wt Readings from Last 3 Encounters:  07/06/11 170 lb 3.2 oz (77.202 kg)     General appearance: well nourished caucasian woman HENNT: normal Lymph nodes: left supraclavicular node more prominent on today's exam approximately 3 cm; no other areas of adenopathy in the neck, right supraclavicular, axillary, or inguinal regions. Breasts: no dominant masses Lungs: Clear to auscultation resonant to percussion Heart: Regular rhythm no murmur Abdomen: Soft nontender no mass no organomegaly Extremities: No edema no calf tenderness Vascular: No cyanosis Neurologic: Motor strength 5 over 5 reflexes 1+ symmetric Skin: No rash or ecchymosis  Lab Results: Lab Results  Component Value Date   WBC 7.3 06/30/2011   HGB 12.8 06/30/2011   HCT 38.3 06/30/2011   MCV 90.5 06/30/2011   PLT 193 06/30/2011     Chemistry      Component Value Date/Time   NA 139 06/30/2011 1036   K 4.3 06/30/2011 1036   CL 108 06/30/2011 1036   CO2 20 06/30/2011 1036   BUN 15 06/30/2011 1036   CREATININE 0.64 06/30/2011 1036      Component Value Date/Time   CALCIUM 9.8 06/30/2011 1036   ALKPHOS 55 06/30/2011 1036   AST 14 06/30/2011 1036   ALT 17 06/30/2011 1036   BILITOT 1.0 06/30/2011 1036  Impression and Plan: #1. Low-grade non-Hodgkin's lymphoma. Stable disease on no treatment for many years. Her index lesion the left supraclavicular lymph node feels more prominent to me on today's exam and I remember in the past. No other areas of external adenopathy. She remains asymptomatic. I'm going to see her back again in 6 months and not wait a year. I haven't been getting routine scans on her but if I have any concerns with the lymph  node in her neck is growing I will go ahead and restage her. 2.  Stage I, ER negative, cancer of the right breast treated as outlined above.  No evidence for new disease. 3.  Hypothyroid, on replacement. 4.  GERD. 5.  Hyperlipidemia. 6.  Degenerative arthritis. 7.  Previous moderate thrombocytopenia - resolved spontaneously.   CC:.    Levert Feinstein, MD 3/4/20136:29 PM

## 2011-07-06 NOTE — Telephone Encounter (Signed)
Gv pt appt for sept2013.  called the breast center to schedule mammogram s/w Victorino Dike and she stated that the diag should read other screens. Informed Dr. Reece Agar of info and that I can not schedule appt until it is corrected and he asked that I call the [facility] and have there supervisor call him.  Called the [facility] and left a message on the vm for supervisor to rtn call and at that time I will transfer the call to Dr. Reece Agar

## 2011-07-10 ENCOUNTER — Telehealth: Payer: Self-pay | Admitting: Oncology

## 2011-07-10 NOTE — Telephone Encounter (Signed)
Talked to pt, gave her appt for MD and lab in September 2013 and mammogram in may 2013

## 2011-07-15 DIAGNOSIS — B351 Tinea unguium: Secondary | ICD-10-CM | POA: Diagnosis not present

## 2011-07-15 DIAGNOSIS — M79609 Pain in unspecified limb: Secondary | ICD-10-CM | POA: Diagnosis not present

## 2011-07-29 DIAGNOSIS — J029 Acute pharyngitis, unspecified: Secondary | ICD-10-CM | POA: Diagnosis not present

## 2011-09-02 DIAGNOSIS — R5381 Other malaise: Secondary | ICD-10-CM | POA: Diagnosis not present

## 2011-09-02 DIAGNOSIS — R5383 Other fatigue: Secondary | ICD-10-CM | POA: Diagnosis not present

## 2011-09-10 ENCOUNTER — Other Ambulatory Visit: Payer: Self-pay | Admitting: Interventional Radiology

## 2011-09-10 DIAGNOSIS — I83819 Varicose veins of unspecified lower extremities with pain: Secondary | ICD-10-CM

## 2011-09-16 DIAGNOSIS — B351 Tinea unguium: Secondary | ICD-10-CM | POA: Diagnosis not present

## 2011-09-16 DIAGNOSIS — M79609 Pain in unspecified limb: Secondary | ICD-10-CM | POA: Diagnosis not present

## 2011-09-21 ENCOUNTER — Ambulatory Visit
Admission: RE | Admit: 2011-09-21 | Discharge: 2011-09-21 | Disposition: A | Discharge status: Home / Self Care | Payer: Medicare Other | Source: Ambulatory Visit | Attending: Oncology | Admitting: Oncology

## 2011-09-21 DIAGNOSIS — Z1231 Encounter for screening mammogram for malignant neoplasm of breast: Secondary | ICD-10-CM | POA: Diagnosis not present

## 2011-09-23 ENCOUNTER — Other Ambulatory Visit: Payer: Self-pay | Admitting: Obstetrics

## 2011-09-23 DIAGNOSIS — M810 Age-related osteoporosis without current pathological fracture: Secondary | ICD-10-CM | POA: Diagnosis not present

## 2011-09-23 DIAGNOSIS — M899 Disorder of bone, unspecified: Secondary | ICD-10-CM

## 2011-09-23 DIAGNOSIS — Z124 Encounter for screening for malignant neoplasm of cervix: Secondary | ICD-10-CM | POA: Diagnosis not present

## 2011-09-23 DIAGNOSIS — M949 Disorder of cartilage, unspecified: Secondary | ICD-10-CM

## 2011-09-30 ENCOUNTER — Ambulatory Visit
Admission: RE | Admit: 2011-09-30 | Discharge: 2011-09-30 | Disposition: A | Discharge status: Home / Self Care | Payer: Medicare Other | Source: Ambulatory Visit | Attending: Interventional Radiology | Admitting: Interventional Radiology

## 2011-09-30 VITALS — BP 98/58 | HR 70 | Temp 97.6°F | Resp 16 | Ht 61.75 in | Wt 174.0 lb

## 2011-09-30 DIAGNOSIS — I83893 Varicose veins of bilateral lower extremities with other complications: Secondary | ICD-10-CM | POA: Diagnosis not present

## 2011-09-30 DIAGNOSIS — K219 Gastro-esophageal reflux disease without esophagitis: Secondary | ICD-10-CM | POA: Diagnosis not present

## 2011-09-30 DIAGNOSIS — I83819 Varicose veins of unspecified lower extremities with pain: Secondary | ICD-10-CM

## 2011-09-30 NOTE — Consult Note (Signed)
Reason for Consult:bilateral lower extremity edema, pain, tiredness Referring Physician: Patricia [name] is a 77 y.o. female.  HPI: The patient is known to our service from previous evaluation of symptomatic left lower extremity varicose veins on   12/01/2006.   At that time, she had only mild reflux in the left GSV, and responded well to graduated compression stockings, so she was thus treated conservatively.  She now complains of worsening bilateral lower extremity swelling, pain, burning, and tiredness, worse on the left than right, exacerbated by standing and walking.  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  She has had no interval vein treatment.  No history of DVT, PE, or venous ulceration.  Past Medical History  Diagnosis Date  . Thyroid disease   . Cancer of breast   . Lymphoma   . Breast cancer, stage 1, estrogen receptor negative 07/06/2011  . Lymphoma of lymph nodes of head, face, and/or neck 07/06/2011  . Hypothyroid 07/06/2011  . Chronic ITP (idiopathic thrombocytopenia) 07/06/2011  Arthritis Hypercholesterolemia GE reflux  No past surgical history on file.  There is a history of varicose/spider veins in her mother.}  Social History:  reports that she quit smoking about 30 years ago. Her smoking use included Cigarettes. She smoked .3 packs per day. She has never used smokeless tobacco. She reports that she drinks alcohol. Her drug history not on file.  Allergies:  Allergies  Allergen Reactions  . Codeine Other (See Comments)    unknown  . Doxycycline Other (See Comments)    unknown  . Sulfa Antibiotics Itching  None to latex or contrast.  Medications: reviewed    Review of Systems  Constitutional: Negative.  Negative for fever and chills.  Psychiatric/Behavioral: Positive for depression.   Blood pressure 98/58, pulse 70,  temperature 97.6 F (36.4 C), temperature source Oral, resp. rate 16, height 5' 1.75" (1.568 m), weight 174 lb (78.926 kg), SpO2 98.00%. Physical Exam  Constitutional: She is oriented to person, place, and time. She appears well-developed and well-nourished. No distress.  HENT:  Head: Normocephalic.  Eyes: Conjunctivae are normal. No scleral icterus.  Neck: Normal range of motion.  Respiratory: Effort normal. No stridor. No respiratory distress.  GI: She exhibits no distension.  Musculoskeletal: Normal range of motion.  Neurological: She is alert and oriented to person, place, and time. Coordination normal.  Skin: Skin is warm and dry. She is not diaphoretic.  Psychiatric: She has a normal mood and affect. Her behavior is normal. Judgment and thought content normal.   BILATERALLOWER EXTREMITY VENOUS DOPPLER ULTRASOUND  Technique: Gray-scale sonography with compression as well as color and duplex Doppler ultrasound were performed to evaluate the deep venous system from the level of the common femoral vein through the popliteal and proximal calf veins. Standing gray-scale sonography and duplex Doppler evaluation of the superficial venous system with augmentation was performed.  Findings: The lower extremity deep venous systems demonstrate normal compressibility, phasicity, and augmentation. No evidence of DVT.  On the left, there is a 6 x 15 x 23 mm popliteal cyst. The saphenofemoral junction is competent. The greater saphenous vein above the knee is normal in caliber without reflux. There is long term reflux after augmentation in the greater saphenous vein below the knee. The short saphenous vein is diminutive, without evidence of reflux.  On the right, the saphenofemoral junction is competent. There is no  dilatation, reflux, or valvular incompetence in the greater saphenous vein above the knee. Below the knee, there is valvular incompetence and long term reflux after  augmentation through the length of the greater saphenous vein from the ankle to the knee. The short saphenous vein is diminutive, without evidence of valvular incompetence or reflux.  IMPRESSION  1. Negative for lower extremity DVT. 2. 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 3. Left Baker's cyst.     Assessment/Plan: My impression is that her progression of symptoms is likely related to the documented progression of the venous valvular incompetence and reflux in the bilateral greater saphenous veins.  GIven that her symptoms are now poorly controlled with conservative measures including graduated compression hose, she would be an appropriate candidate for consideration of endovascular laser ablation of the refluxing greater saphenous vein segments.  We spent the majority of the 30-minute consultation with her husband present, discussing the pathophysiology of varicose veins disease, treatment options, the endovascular laser ablation technique, anticipated benefits, time course of symptom resolution, possible risks and side effects, and follow-up.  She seemed to understand and was motivated to proceed with treatment.  Accordingly, we can set this up at her convenience pending carrier approval.  Worth Kober III,DAYNE Mattix Imhof 09/30/2011, 1:04 PM

## 2011-10-08 ENCOUNTER — Telehealth: Payer: Self-pay | Admitting: *Deleted

## 2011-10-08 NOTE — Telephone Encounter (Signed)
Received call from pt stating that she wears compression stockings & having a lot of pain in her legs but worse in the left.  She has seen Dr Oley Balm &he suggested a lazer treatment for medical reasons called angiodynamics.  She would like Dr. Patsy Lager opinion on her having this.  She can be reached at [phone].

## 2011-10-12 ENCOUNTER — Telehealth: Payer: Self-pay

## 2011-10-12 NOTE — Telephone Encounter (Signed)
Called pt to inform her per Dr. Cyndie Chime, he does not see any problem with her having the laser treatment.  Pt verbalizes understanding.

## 2011-10-15 ENCOUNTER — Other Ambulatory Visit: Payer: Self-pay | Admitting: Interventional Radiology

## 2011-10-15 DIAGNOSIS — I83812 Varicose veins of left lower extremities with pain: Secondary | ICD-10-CM

## 2011-10-16 ENCOUNTER — Ambulatory Visit
Admission: RE | Admit: 2011-10-16 | Discharge: 2011-10-16 | Disposition: A | Discharge status: Home / Self Care | Payer: Medicare Other | Source: Ambulatory Visit | Attending: Obstetrics | Admitting: Obstetrics

## 2011-10-16 DIAGNOSIS — M899 Disorder of bone, unspecified: Secondary | ICD-10-CM

## 2011-10-16 DIAGNOSIS — Z780 Asymptomatic menopausal state: Secondary | ICD-10-CM | POA: Diagnosis not present

## 2011-10-16 DIAGNOSIS — M949 Disorder of cartilage, unspecified: Secondary | ICD-10-CM | POA: Diagnosis not present

## 2011-10-21 DIAGNOSIS — E039 Hypothyroidism, unspecified: Secondary | ICD-10-CM | POA: Diagnosis not present

## 2011-10-21 DIAGNOSIS — E785 Hyperlipidemia, unspecified: Secondary | ICD-10-CM | POA: Diagnosis not present

## 2011-10-21 DIAGNOSIS — R7301 Impaired fasting glucose: Secondary | ICD-10-CM | POA: Diagnosis not present

## 2011-10-21 DIAGNOSIS — Z23 Encounter for immunization: Secondary | ICD-10-CM | POA: Diagnosis not present

## 2011-10-21 DIAGNOSIS — M810 Age-related osteoporosis without current pathological fracture: Secondary | ICD-10-CM | POA: Diagnosis not present

## 2011-10-21 DIAGNOSIS — Z0000 Encounter for general adult medical examination without abnormal findings: Secondary | ICD-10-CM | POA: Diagnosis not present

## 2011-10-28 DIAGNOSIS — K219 Gastro-esophageal reflux disease without esophagitis: Secondary | ICD-10-CM | POA: Diagnosis not present

## 2011-10-28 DIAGNOSIS — E785 Hyperlipidemia, unspecified: Secondary | ICD-10-CM | POA: Diagnosis not present

## 2011-10-28 DIAGNOSIS — M810 Age-related osteoporosis without current pathological fracture: Secondary | ICD-10-CM | POA: Diagnosis not present

## 2011-10-28 DIAGNOSIS — E039 Hypothyroidism, unspecified: Secondary | ICD-10-CM | POA: Diagnosis not present

## 2011-10-28 DIAGNOSIS — H6120 Impacted cerumen, unspecified ear: Secondary | ICD-10-CM | POA: Diagnosis not present

## 2011-11-03 ENCOUNTER — Ambulatory Visit
Admission: RE | Admit: 2011-11-03 | Discharge: 2011-11-03 | Disposition: A | Discharge status: Home / Self Care | Payer: Medicare Other | Source: Ambulatory Visit | Attending: Interventional Radiology | Admitting: Interventional Radiology

## 2011-11-03 VITALS — BP 126/70 | HR 70 | Temp 97.7°F | Resp 16

## 2011-11-03 DIAGNOSIS — I83812 Varicose veins of left lower extremities with pain: Secondary | ICD-10-CM

## 2011-11-03 DIAGNOSIS — I83893 Varicose veins of bilateral lower extremities with other complications: Secondary | ICD-10-CM | POA: Diagnosis not present

## 2011-11-03 NOTE — Progress Notes (Signed)
1240  Informed consent obtained for EVLT of GSV of LLE for symptomatic varicose veins.  1245  Verbal & written discharge instructions given.  Patient states that she understands.  1245  Saline lock started IV Left had w/ 22 g x 1" insyte angiocath x 1 attempt w/ NSL & 7" extension.  Flushed w/ 5 ml NSS w/o difficulty.  Site unremarkable.  1250  Pt received pre-procedural Valium.  1400  Procedure started.  1450 Procedure completed.  Patient tolerated well.  1455  Patient wearing thigh high graduated compression garment (20-30 mm Hg) on LLE.  IV Discontinued, catheter intact, site "unremarkable".    1510 Ambulated x 10 mins w/o assistance.  Tolerated well.    1515  Reviewed discharge instructions w/ patient.  Has driver available.  Discharged to home.

## 2011-11-04 ENCOUNTER — Other Ambulatory Visit: Payer: Self-pay | Admitting: Interventional Radiology

## 2011-11-04 DIAGNOSIS — I83819 Varicose veins of unspecified lower extremities with pain: Secondary | ICD-10-CM

## 2011-11-10 DIAGNOSIS — M810 Age-related osteoporosis without current pathological fracture: Secondary | ICD-10-CM | POA: Diagnosis not present

## 2011-11-11 ENCOUNTER — Ambulatory Visit
Admission: RE | Admit: 2011-11-11 | Discharge: 2011-11-11 | Disposition: A | Discharge status: Home / Self Care | Payer: Medicare Other | Source: Ambulatory Visit | Attending: Interventional Radiology | Admitting: Interventional Radiology

## 2011-11-11 DIAGNOSIS — I83893 Varicose veins of bilateral lower extremities with other complications: Secondary | ICD-10-CM | POA: Diagnosis not present

## 2011-11-11 DIAGNOSIS — I83812 Varicose veins of left lower extremities with pain: Secondary | ICD-10-CM

## 2011-11-11 DIAGNOSIS — Z5189 Encounter for other specified aftercare: Secondary | ICD-10-CM | POA: Diagnosis not present

## 2011-11-16 DIAGNOSIS — M79609 Pain in unspecified limb: Secondary | ICD-10-CM | POA: Diagnosis not present

## 2011-11-16 DIAGNOSIS — IMO0002 Reserved for concepts with insufficient information to code with codable children: Secondary | ICD-10-CM | POA: Diagnosis not present

## 2011-11-17 DIAGNOSIS — H903 Sensorineural hearing loss, bilateral: Secondary | ICD-10-CM | POA: Diagnosis not present

## 2011-11-17 DIAGNOSIS — H6120 Impacted cerumen, unspecified ear: Secondary | ICD-10-CM | POA: Diagnosis not present

## 2011-11-17 DIAGNOSIS — H9209 Otalgia, unspecified ear: Secondary | ICD-10-CM | POA: Diagnosis not present

## 2011-11-17 DIAGNOSIS — M79609 Pain in unspecified limb: Secondary | ICD-10-CM | POA: Diagnosis not present

## 2011-11-17 DIAGNOSIS — B351 Tinea unguium: Secondary | ICD-10-CM | POA: Diagnosis not present

## 2011-11-25 ENCOUNTER — Encounter (HOSPITAL_COMMUNITY): Payer: Self-pay

## 2011-11-25 ENCOUNTER — Encounter (HOSPITAL_COMMUNITY)
Admission: RE | Admit: 2011-11-25 | Discharge: 2011-11-25 | Disposition: A | Discharge status: Home / Self Care | Payer: Medicare Other | Source: Ambulatory Visit | Attending: Obstetrics | Admitting: Obstetrics

## 2011-11-25 DIAGNOSIS — M810 Age-related osteoporosis without current pathological fracture: Secondary | ICD-10-CM | POA: Insufficient documentation

## 2011-11-25 MED ORDER — SODIUM CHLORIDE 0.9 % IV SOLN
INTRAVENOUS | Status: AC
  Administered 2011-11-25: 14:00:00 via INTRAVENOUS

## 2011-11-25 MED ORDER — ZOLEDRONIC ACID 5 MG/100ML IV SOLN
5.0000 mg | Freq: Once | INTRAVENOUS | Status: AC
  Administered 2011-11-25: 5 mg via INTRAVENOUS
  Filled 2011-11-25: qty 100

## 2011-12-01 ENCOUNTER — Ambulatory Visit
Admission: RE | Admit: 2011-12-01 | Discharge: 2011-12-01 | Disposition: A | Discharge status: Home / Self Care | Payer: Medicare Other | Source: Ambulatory Visit | Attending: Interventional Radiology | Admitting: Interventional Radiology

## 2011-12-01 ENCOUNTER — Other Ambulatory Visit: Payer: Self-pay | Admitting: Interventional Radiology

## 2011-12-01 DIAGNOSIS — I83812 Varicose veins of left lower extremities with pain: Secondary | ICD-10-CM

## 2011-12-01 DIAGNOSIS — I83893 Varicose veins of bilateral lower extremities with other complications: Secondary | ICD-10-CM | POA: Diagnosis not present

## 2011-12-01 DIAGNOSIS — I83819 Varicose veins of unspecified lower extremities with pain: Secondary | ICD-10-CM

## 2011-12-01 MED ORDER — DIAZEPAM 5 MG PO TABS
5.0000 mg | ORAL_TABLET | Freq: Once | ORAL | Status: AC
  Administered 2011-12-01: 5 mg via ORAL

## 2011-12-01 NOTE — Progress Notes (Signed)
Escorted patient around office for ten minutes after procedure and after patient donned her compression stockings.  She tolerated ambulation well and denied any pain/discomfort.  She stated she felt "a little drunk from the valium," so escorted to husband and friend in lobby for them to help her to car out front.  jkl

## 2011-12-08 ENCOUNTER — Ambulatory Visit
Admission: RE | Admit: 2011-12-08 | Discharge: 2011-12-08 | Disposition: A | Discharge status: Home / Self Care | Payer: Medicare Other | Source: Ambulatory Visit | Attending: Interventional Radiology | Admitting: Interventional Radiology

## 2011-12-08 DIAGNOSIS — I82819 Embolism and thrombosis of superficial veins of unspecified lower extremities: Secondary | ICD-10-CM | POA: Diagnosis not present

## 2011-12-08 DIAGNOSIS — IMO0001 Reserved for inherently not codable concepts without codable children: Secondary | ICD-10-CM | POA: Diagnosis not present

## 2011-12-08 DIAGNOSIS — I83819 Varicose veins of unspecified lower extremities with pain: Secondary | ICD-10-CM

## 2011-12-29 DIAGNOSIS — L821 Other seborrheic keratosis: Secondary | ICD-10-CM | POA: Diagnosis not present

## 2011-12-29 DIAGNOSIS — D692 Other nonthrombocytopenic purpura: Secondary | ICD-10-CM | POA: Diagnosis not present

## 2011-12-30 DIAGNOSIS — IMO0002 Reserved for concepts with insufficient information to code with codable children: Secondary | ICD-10-CM | POA: Diagnosis not present

## 2011-12-30 DIAGNOSIS — M25569 Pain in unspecified knee: Secondary | ICD-10-CM | POA: Diagnosis not present

## 2011-12-30 DIAGNOSIS — M1710 Unilateral primary osteoarthritis, unspecified knee: Secondary | ICD-10-CM | POA: Diagnosis not present

## 2011-12-31 DIAGNOSIS — IMO0002 Reserved for concepts with insufficient information to code with codable children: Secondary | ICD-10-CM | POA: Diagnosis not present

## 2011-12-31 DIAGNOSIS — M25569 Pain in unspecified knee: Secondary | ICD-10-CM | POA: Diagnosis not present

## 2011-12-31 DIAGNOSIS — M1710 Unilateral primary osteoarthritis, unspecified knee: Secondary | ICD-10-CM | POA: Diagnosis not present

## 2012-01-07 ENCOUNTER — Ambulatory Visit
Admission: RE | Admit: 2012-01-07 | Discharge: 2012-01-07 | Disposition: A | Discharge status: Home / Self Care | Payer: Medicare Other | Source: Ambulatory Visit | Attending: Interventional Radiology | Admitting: Interventional Radiology

## 2012-01-07 DIAGNOSIS — I83893 Varicose veins of bilateral lower extremities with other complications: Secondary | ICD-10-CM | POA: Diagnosis not present

## 2012-01-07 DIAGNOSIS — I83819 Varicose veins of unspecified lower extremities with pain: Secondary | ICD-10-CM

## 2012-01-11 ENCOUNTER — Telehealth: Payer: Self-pay | Admitting: Oncology

## 2012-01-11 ENCOUNTER — Other Ambulatory Visit (HOSPITAL_BASED_OUTPATIENT_CLINIC_OR_DEPARTMENT_OTHER): Payer: Medicare Other | Admitting: Lab

## 2012-01-11 ENCOUNTER — Ambulatory Visit (HOSPITAL_BASED_OUTPATIENT_CLINIC_OR_DEPARTMENT_OTHER): Payer: Medicare Other | Admitting: Oncology

## 2012-01-11 VITALS — BP 119/72 | HR 71 | Temp 97.1°F | Resp 20 | Ht 61.75 in | Wt 168.7 lb

## 2012-01-11 DIAGNOSIS — C8581 Other specified types of non-Hodgkin lymphoma, lymph nodes of head, face, and neck: Secondary | ICD-10-CM | POA: Diagnosis not present

## 2012-01-11 DIAGNOSIS — C50919 Malignant neoplasm of unspecified site of unspecified female breast: Secondary | ICD-10-CM

## 2012-01-11 DIAGNOSIS — C8591 Non-Hodgkin lymphoma, unspecified, lymph nodes of head, face, and neck: Secondary | ICD-10-CM

## 2012-01-11 DIAGNOSIS — Z171 Estrogen receptor negative status [ER-]: Secondary | ICD-10-CM

## 2012-01-11 DIAGNOSIS — D693 Immune thrombocytopenic purpura: Secondary | ICD-10-CM

## 2012-01-11 DIAGNOSIS — C8510 Unspecified B-cell lymphoma, unspecified site: Secondary | ICD-10-CM

## 2012-01-11 LAB — CBC WITH DIFFERENTIAL/PLATELET
BASO%: 0.4 % (ref 0.0–2.0)
Eosinophils Absolute: 0.2 10*3/uL (ref 0.0–0.5)
MONO#: 0.7 10*3/uL (ref 0.1–0.9)
NEUT#: 7.8 10*3/uL — ABNORMAL HIGH (ref 1.5–6.5)
RBC: 4.16 10*6/uL (ref 3.70–5.45)
RDW: 13.5 % (ref 11.2–14.5)
WBC: 11 10*3/uL — ABNORMAL HIGH (ref 3.9–10.3)

## 2012-01-11 NOTE — Telephone Encounter (Signed)
Gave pt appt for March 2014 lab and ML °

## 2012-01-11 NOTE — Progress Notes (Signed)
Hematology and Oncology Follow Up Visit  Darlene [name] [id number] 10-Sep-1934 77 y.o. 01/11/2012 6:58 PM   Principle Diagnosis: Encounter Diagnoses  Name Primary?  . Low grade B-cell lymphoma Yes  . Carcinoma of breast, stage 1, estrogen receptor negative   . Chronic ITP (idiopathic thrombocytopenia)      Interim History:   Followup visit for this now 77 year old woman with a remote history of limited stage low grade B-cell non-Hodgkin's lymphoma, presenting with left supraclavicular lymphadenopathy in April 1999. She never required any systemic treatment. 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 This has also resolved on its own. Platelet count when she first visited me in June 2006 was 78,000, by March 2008 platelet count was up to 167,000 and remains normal since that time. She has never developed any additional adenopathy other than the single lymph node palpable in the left supraclavicular fossa. She did develop a second primary stage I 0.8 cm ER negative cancer of the right breast, treated with lumpectomy, radiation and six cycles of CMF chemotherapy in 1994. No evidence of  new disease from the breast cancer. A  Since last visit here in March, she has had a steroid injection in her right knee joint about one week ago for arthritis pain. She had bilateral laser surgery on varicose veins in her legs.  She has not noted any new areas of adenopathy or growth of the single lymph node in the left supraclavicular fossa that we have been following for years now.  Her husband has Parkinson's disease which is slowly getting worse. He is now 75 years old. She is his main caregiver.   Medications: reviewed  Allergies:  Allergies  Allergen Reactions  . Codeine Itching    Makes face itch   . Doxycycline Other (See Comments)    Burned throat and stomach  . Meperidine And Related Other (See Comments)    Makes cheeks red  . Sulfa  Antibiotics Itching    Review of Systems: Constitutional:   No constitutional symptoms  Respiratory:No cough or dyspnea  Cardiovascular:  No chest pain or palpitations  Gastrointestinal:No change in bowel habit  Genito-Urinary: No urinary tract symptoms. No vaginal bleeding.  Musculoskeletal:Arthritis flareup as noted above  Neurologic:No headache or change in vision  Skin:No rash or ecchymosis  Remaining ROS negative.  Physical Exam: Blood pressure 119/72, pulse 71, temperature 97.1 F (36.2 C), temperature source Oral, resp. rate 20, height 5' 1.75" (1.568 m), weight 168 lb 11.2 oz (76.522 kg). Wt Readings from Last 3 Encounters:  01/11/12 168 lb 11.2 oz (76.522 kg)  09/30/11 174 lb (78.926 kg)  07/06/11 170 lb 3.2 oz (77.202 kg)     General appearance: Well-nourished Caucasian woman  HENNT: Pharynx no erythema exudate or mass  Lymph nodes: Persistent but stable approximate 2 cm left supraclavicular lymph node no other areas of adenopathy in the neck, right supraclavicular region, axillae, or inguinal region.  Breasts:Surgical changes right breast no dominant mass in either breast  Lungs:Clear to auscultation resonant to percussion  Heart:Regular rhythm no murmur  Abdomen:Soft nontender no mass no organomegaly  Extremities:No edema no calf tenderness  Vascular:No cyanosis  Neurologic:Motor strength 5 over 5, reflexes 1+ symmetric  Skin:No rash or ecchymosis  Lab Results: Lab Results  Component Value Date   WBC 11.0* 01/11/2012   HGB 12.1 01/11/2012   HCT 36.2 01/11/2012   MCV 87.0 01/11/2012   PLT 245 01/11/2012     Chemistry  Component Value Date/Time   NA 139 06/30/2011 1036   K 4.3 06/30/2011 1036   CL 108 06/30/2011 1036   CO2 20 06/30/2011 1036   BUN 15 06/30/2011 1036   CREATININE 0.64 06/30/2011 1036      Component Value Date/Time   CALCIUM 9.8 06/30/2011 1036   ALKPHOS 55 06/30/2011 1036   AST 14 06/30/2011 1036   ALT 17 06/30/2011 1036   BILITOT 1.0 06/30/2011  1036       Radiological Studies: US Venous Img Lower Unilateral Right  01/08/2012  *RADIOLOGY REPORT*  Clinical data: Symptomatic bilateral leg varicose veins, now 2 months status post endovascular occlusion of the left greater saphenous vein, 5 weeks post     transcatheter laser occlusion of the right greater saphenous vein.  RIGHT LOWER EXTREMITY VENOUS DOPPLER ULTRASOUND  Technique: Gray-scale sonography with compression as well as color and duplex Doppler ultrasound were performed to evaluate   the saphenous vein and the deep venous system from the level of the common femoral vein through the popliteal and proximal calf veins.  Findings: The lower extremity deep venous system demonstrates normal compressibility, phasicity, and augmentation. Posterior tibial vein unremarkable. No evidence of DVT.  The greater saphenous vein is occluded throughout the treatment segment. Associated varicose veins in the thigh and calf region are decompressed.  IMPRESSION 1. Technically successful occlusion of the greater saphenous vein along the treatment length without apparent complication. 2. Normal deep venous system. No DVT.   Original Report Authenticated By: Osa Craver, M.D.    [name] [facility]  01/08/2012  *RADIOLOGY REPORT*  ESTABLISHED PATIENT OFFICE VISIT - LEVEL III ([id number])  This 77 year old female returns for her scheduled     1 month appointment after transcatheter laser occlusion of the right greater saphenous vein due to valvular incompetence and reflux resulting in symptomatic varicose veins.  The patient describes no significant tenderness along the treatment course. She has tolerated the  graduated compression stockings and exercise regimen.  On exam, no discoloration or bruising   along the treatment course. No significant tenderness. No erythema, swelling, or fluctuance. Skin entry site is   completely healed.  Venous Doppler ultrasound demonstrates complete occlusion of the treatment  segment of the GSV. The deep venous system remains normal.  My impression is that she is doing well one  1 month status post transcatheter occlusion of the right greater saphenous vein. I encouraged continued use of the graduated compression stockings during waking  hours. I encouraged continuation of the  exercise regimen. We will see her back at the 6 month followup. The patient knows to call should there be any interval questions or problems.   Original Report Authenticated By: Thora Lance III, M.D.    Bilateral mammography done 09/21/2011 shows no new suspicious areas.  Impression and Plan: #1. Low-grade non-Hodgkin's lymphoma.  Stable disease on no treatment for many years.  Her index lesion, the left supraclavicular lymph node, is stable compared with my exam back in March. I thought that the node was growing but I am not convinced of this at this time.. No other areas of external adenopathy. She remains asymptomatic.  I'm going to see her back again in 6 months and if otherwise stable then go back on an annual followup program.  2. Stage I, ER negative, cancer of the right breast treated as outlined above. No evidence for new disease.  3. Hypothyroid, on replacement.  4. GERD.  5. Hyperlipidemia.  6. Degenerative arthritis. Status post recent steroid injection right knee 7. Previous moderate thrombocytopenia - resolved spontaneously. 8. Status post recent laser surgery both legs for venous disease.    CC:. Dr. Shary Decamp   Levert Feinstein, MD 9/9/20136:58 PM

## 2012-01-19 DIAGNOSIS — Z23 Encounter for immunization: Secondary | ICD-10-CM | POA: Diagnosis not present

## 2012-02-17 DIAGNOSIS — M25569 Pain in unspecified knee: Secondary | ICD-10-CM | POA: Diagnosis not present

## 2012-02-17 DIAGNOSIS — M1710 Unilateral primary osteoarthritis, unspecified knee: Secondary | ICD-10-CM | POA: Diagnosis not present

## 2012-02-17 DIAGNOSIS — IMO0002 Reserved for concepts with insufficient information to code with codable children: Secondary | ICD-10-CM | POA: Diagnosis not present

## 2012-03-01 DIAGNOSIS — B351 Tinea unguium: Secondary | ICD-10-CM | POA: Diagnosis not present

## 2012-03-01 DIAGNOSIS — M79609 Pain in unspecified limb: Secondary | ICD-10-CM | POA: Diagnosis not present

## 2012-03-10 DIAGNOSIS — J019 Acute sinusitis, unspecified: Secondary | ICD-10-CM | POA: Diagnosis not present

## 2012-03-17 DIAGNOSIS — R21 Rash and other nonspecific skin eruption: Secondary | ICD-10-CM | POA: Diagnosis not present

## 2012-03-21 DIAGNOSIS — E559 Vitamin D deficiency, unspecified: Secondary | ICD-10-CM | POA: Diagnosis not present

## 2012-03-21 DIAGNOSIS — M810 Age-related osteoporosis without current pathological fracture: Secondary | ICD-10-CM | POA: Diagnosis not present

## 2012-03-21 DIAGNOSIS — E039 Hypothyroidism, unspecified: Secondary | ICD-10-CM | POA: Diagnosis not present

## 2012-04-21 ENCOUNTER — Other Ambulatory Visit: Payer: Self-pay | Admitting: Interventional Radiology

## 2012-04-21 DIAGNOSIS — I8393 Asymptomatic varicose veins of bilateral lower extremities: Secondary | ICD-10-CM

## 2012-04-25 DIAGNOSIS — E559 Vitamin D deficiency, unspecified: Secondary | ICD-10-CM | POA: Diagnosis not present

## 2012-04-25 DIAGNOSIS — E785 Hyperlipidemia, unspecified: Secondary | ICD-10-CM | POA: Diagnosis not present

## 2012-05-02 DIAGNOSIS — M810 Age-related osteoporosis without current pathological fracture: Secondary | ICD-10-CM | POA: Diagnosis not present

## 2012-05-02 DIAGNOSIS — E785 Hyperlipidemia, unspecified: Secondary | ICD-10-CM | POA: Diagnosis not present

## 2012-05-02 DIAGNOSIS — F411 Generalized anxiety disorder: Secondary | ICD-10-CM | POA: Diagnosis not present

## 2012-05-02 DIAGNOSIS — E039 Hypothyroidism, unspecified: Secondary | ICD-10-CM | POA: Diagnosis not present

## 2012-05-04 HISTORY — PX: CARDIAC CATHETERIZATION: SHX172

## 2012-05-11 DIAGNOSIS — J069 Acute upper respiratory infection, unspecified: Secondary | ICD-10-CM | POA: Diagnosis not present

## 2012-05-11 DIAGNOSIS — E785 Hyperlipidemia, unspecified: Secondary | ICD-10-CM | POA: Diagnosis not present

## 2012-05-24 ENCOUNTER — Ambulatory Visit
Admission: RE | Admit: 2012-05-24 | Discharge: 2012-05-24 | Disposition: A | Discharge status: Home / Self Care | Payer: Medicare Other | Source: Ambulatory Visit | Attending: Interventional Radiology | Admitting: Interventional Radiology

## 2012-05-24 DIAGNOSIS — I82819 Embolism and thrombosis of superficial veins of unspecified lower extremities: Secondary | ICD-10-CM | POA: Diagnosis not present

## 2012-05-24 DIAGNOSIS — I8393 Asymptomatic varicose veins of bilateral lower extremities: Secondary | ICD-10-CM

## 2012-05-24 DIAGNOSIS — I83893 Varicose veins of bilateral lower extremities with other complications: Secondary | ICD-10-CM | POA: Diagnosis not present

## 2012-06-09 DIAGNOSIS — B351 Tinea unguium: Secondary | ICD-10-CM | POA: Diagnosis not present

## 2012-06-09 DIAGNOSIS — M79609 Pain in unspecified limb: Secondary | ICD-10-CM | POA: Diagnosis not present

## 2012-06-27 DIAGNOSIS — H2510 Age-related nuclear cataract, unspecified eye: Secondary | ICD-10-CM | POA: Diagnosis not present

## 2012-06-27 DIAGNOSIS — H52209 Unspecified astigmatism, unspecified eye: Secondary | ICD-10-CM | POA: Diagnosis not present

## 2012-07-01 DIAGNOSIS — S8010XA Contusion of unspecified lower leg, initial encounter: Secondary | ICD-10-CM | POA: Diagnosis not present

## 2012-07-11 ENCOUNTER — Other Ambulatory Visit (HOSPITAL_BASED_OUTPATIENT_CLINIC_OR_DEPARTMENT_OTHER): Payer: Medicare Other | Admitting: Lab

## 2012-07-11 ENCOUNTER — Ambulatory Visit (HOSPITAL_BASED_OUTPATIENT_CLINIC_OR_DEPARTMENT_OTHER): Payer: Medicare Other | Admitting: Nurse Practitioner

## 2012-07-11 ENCOUNTER — Telehealth: Payer: Self-pay | Admitting: Oncology

## 2012-07-11 ENCOUNTER — Other Ambulatory Visit: Payer: Self-pay | Admitting: Nurse Practitioner

## 2012-07-11 VITALS — BP 113/67 | HR 68 | Temp 96.9°F | Resp 18 | Ht 62.0 in | Wt 163.9 lb

## 2012-07-11 DIAGNOSIS — C8589 Other specified types of non-Hodgkin lymphoma, extranodal and solid organ sites: Secondary | ICD-10-CM | POA: Diagnosis not present

## 2012-07-11 DIAGNOSIS — C8581 Other specified types of non-Hodgkin lymphoma, lymph nodes of head, face, and neck: Secondary | ICD-10-CM | POA: Diagnosis not present

## 2012-07-11 DIAGNOSIS — C50911 Malignant neoplasm of unspecified site of right female breast: Secondary | ICD-10-CM

## 2012-07-11 DIAGNOSIS — D693 Immune thrombocytopenic purpura: Secondary | ICD-10-CM

## 2012-07-11 DIAGNOSIS — C50919 Malignant neoplasm of unspecified site of unspecified female breast: Secondary | ICD-10-CM | POA: Diagnosis not present

## 2012-07-11 DIAGNOSIS — Z853 Personal history of malignant neoplasm of breast: Secondary | ICD-10-CM

## 2012-07-11 DIAGNOSIS — C8591 Non-Hodgkin lymphoma, unspecified, lymph nodes of head, face, and neck: Secondary | ICD-10-CM

## 2012-07-11 DIAGNOSIS — Z171 Estrogen receptor negative status [ER-]: Secondary | ICD-10-CM | POA: Diagnosis not present

## 2012-07-11 DIAGNOSIS — C8510 Unspecified B-cell lymphoma, unspecified site: Secondary | ICD-10-CM

## 2012-07-11 LAB — CBC WITH DIFFERENTIAL/PLATELET
BASO%: 0.4 % (ref 0.0–2.0)
Basophils Absolute: 0 10e3/uL (ref 0.0–0.1)
EOS%: 2.9 % (ref 0.0–7.0)
Eosinophils Absolute: 0.2 10e3/uL (ref 0.0–0.5)
HCT: 38.2 % (ref 34.8–46.6)
HGB: 12.8 g/dL (ref 11.6–15.9)
LYMPH%: 21.6 % (ref 14.0–49.7)
MCH: 29.8 pg (ref 25.1–34.0)
MCHC: 33.5 g/dL (ref 31.5–36.0)
MCV: 88.8 fL (ref 79.5–101.0)
MONO#: 0.4 10e3/uL (ref 0.1–0.9)
MONO%: 5.5 % (ref 0.0–14.0)
NEUT#: 5.3 10e3/uL (ref 1.5–6.5)
NEUT%: 69.6 % (ref 38.4–76.8)
Platelets: 201 10e3/uL (ref 145–400)
RBC: 4.3 10e6/uL (ref 3.70–5.45)
RDW: 13.1 % (ref 11.2–14.5)
WBC: 7.6 10e3/uL (ref 3.9–10.3)
lymph#: 1.6 10e3/uL (ref 0.9–3.3)

## 2012-07-11 LAB — COMPREHENSIVE METABOLIC PANEL (CC13)
ALT: 22 U/L (ref 0–55)
AST: 14 U/L (ref 5–34)
Albumin: 3.9 g/dL (ref 3.5–5.0)
Alkaline Phosphatase: 53 U/L (ref 40–150)
BUN: 15.5 mg/dL (ref 7.0–26.0)
CO2: 24 meq/L (ref 22–29)
Calcium: 10.4 mg/dL (ref 8.4–10.4)
Chloride: 106 meq/L (ref 98–107)
Creatinine: 0.7 mg/dL (ref 0.6–1.1)
Glucose: 107 mg/dL — ABNORMAL HIGH (ref 70–99)
Potassium: 3.9 meq/L (ref 3.5–5.1)
Sodium: 139 meq/L (ref 136–145)
Total Bilirubin: 1.43 mg/dL — ABNORMAL HIGH (ref 0.20–1.20)
Total Protein: 6.7 g/dL (ref 6.4–8.3)

## 2012-07-11 LAB — MORPHOLOGY
PLT EST: ADEQUATE
RBC Comments: NORMAL

## 2012-07-11 LAB — LACTATE DEHYDROGENASE (CC13): LDH: 171 U/L (ref 125–245)

## 2012-07-11 LAB — SEDIMENTATION RATE: Sed Rate: 3 mm/hr (ref 0–22)

## 2012-07-11 NOTE — Progress Notes (Signed)
OFFICE PROGRESS NOTE  Interval history:  [name] is a 78 year old woman with a remote history of limited stage low-grade B-cell non-Hodgkin's lymphoma presenting with left supraclavicular lymphadenopathy in April 1999. She has never required systemic treatment. 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  She has a history of stage I 0.8 cm ER negative cancer of the right breast treated with lumpectomy, radiation 6 cycles of CMF chemotherapy in 1994. Most recent mammogram in May 2013 was negative for evidence of malignancy.  She is seen today for scheduled followup. She overall feels well. No fevers or sweats. She has a good appetite. No change in the left supraclavicular adenopathy. No areas of adenopathy. No changes over either breast.   Objective: Blood pressure 113/67, pulse 68, temperature 96.9 F (36.1 C), temperature source Oral, resp. rate 18, height 5\' 2"  (1.575 m), weight 163 lb 14.4 oz (74.345 kg).  Oropharynx is without thrush or ulceration. No palpable cervical, right supraclavicular, axillary or inguinal lymph nodes. Stable one to 2 cm left supraclavicular lymph node. Status post right lumpectomy. No mass palpated in either breast. Lungs are clear. Regular cardiac rhythm. Abdomen is soft and nontender. No organomegaly. Extremities are without edema. Calves are soft and nontender.  Lab Results: Lab Results  Component Value Date   WBC 7.6 07/11/2012   HGB 12.8 07/11/2012   HCT 38.2 07/11/2012   MCV 88.8 07/11/2012   PLT 201 07/11/2012    Chemistry:    Chemistry      Component Value Date/Time   NA 139 07/11/2012 0906   NA 139 06/30/2011 1036   K 3.9 07/11/2012 0906   K 4.3 06/30/2011 1036   CL 106 07/11/2012 0906   CL 108 06/30/2011 1036   CO2 24 07/11/2012 0906   CO2 20 06/30/2011 1036   BUN 15.5 07/11/2012 0906   BUN 15 06/30/2011 1036   CREATININE 0.7 07/11/2012 0906   CREATININE 0.64 06/30/2011 1036      Component Value Date/Time   CALCIUM  10.4 07/11/2012 0906   CALCIUM 9.8 06/30/2011 1036   ALKPHOS 53 07/11/2012 0906   ALKPHOS 55 06/30/2011 1036   AST 14 07/11/2012 0906   AST 14 06/30/2011 1036   ALT 22 07/11/2012 0906   ALT 17 06/30/2011 1036   BILITOT 1.43* 07/11/2012 0906   BILITOT 1.0 06/30/2011 1036       Studies/Results: No results found.  Medications: I have reviewed the patient's current medications.  Assessment/Plan:  1. Low-grade non-Hodgkin's lymphoma with stable adenopathy at the left supraclavicular region. 2. History of stage I, ER negative, cancer of the right breast with treatment as outlined above. Next mammogram due May 2014. 3. History of thrombocytopenia. Platelet count remains in normal range. 4. Hypothyroid on replacement. 5. GERD. 6. Hyperlipidemia. 7. Degenerative arthritis. 8. History of laser surgery both legs for venous disease.  Disposition-[name] appears stable. The left supraclavicular adenopathy is unchanged. She is experiencing no symptoms to suggest progression of the lymphoma. We are referring her for her annual mammogram in May of this year. She will return for a CBC in 6 months and a followup visit in one year. She will contact the office in the interim with any problems.  Lonna Cobb ANP/GNP-BC    CC Dr. Shary Decamp.

## 2012-07-11 NOTE — Telephone Encounter (Signed)
gv and printed pt appt schedule for May 21st @ 11:00am, sept, and March 2015

## 2012-07-15 DIAGNOSIS — IMO0002 Reserved for concepts with insufficient information to code with codable children: Secondary | ICD-10-CM | POA: Diagnosis not present

## 2012-07-15 DIAGNOSIS — M1710 Unilateral primary osteoarthritis, unspecified knee: Secondary | ICD-10-CM | POA: Diagnosis not present

## 2012-08-05 DIAGNOSIS — L578 Other skin changes due to chronic exposure to nonionizing radiation: Secondary | ICD-10-CM | POA: Diagnosis not present

## 2012-08-05 DIAGNOSIS — L570 Actinic keratosis: Secondary | ICD-10-CM | POA: Diagnosis not present

## 2012-08-05 DIAGNOSIS — D1801 Hemangioma of skin and subcutaneous tissue: Secondary | ICD-10-CM | POA: Diagnosis not present

## 2012-08-05 DIAGNOSIS — L819 Disorder of pigmentation, unspecified: Secondary | ICD-10-CM | POA: Diagnosis not present

## 2012-08-05 DIAGNOSIS — L821 Other seborrheic keratosis: Secondary | ICD-10-CM | POA: Diagnosis not present

## 2012-09-01 DIAGNOSIS — M79609 Pain in unspecified limb: Secondary | ICD-10-CM | POA: Diagnosis not present

## 2012-09-01 DIAGNOSIS — B351 Tinea unguium: Secondary | ICD-10-CM | POA: Diagnosis not present

## 2012-09-16 ENCOUNTER — Encounter (HOSPITAL_COMMUNITY): Payer: Self-pay | Admitting: *Deleted

## 2012-09-16 ENCOUNTER — Emergency Department (HOSPITAL_COMMUNITY)
Admission: EM | Admit: 2012-09-16 | Discharge: 2012-09-17 | Disposition: A | Discharge status: Home / Self Care | Payer: Medicare Other | Attending: Emergency Medicine | Admitting: Emergency Medicine

## 2012-09-16 ENCOUNTER — Emergency Department (HOSPITAL_COMMUNITY): Payer: Medicare Other

## 2012-09-16 DIAGNOSIS — E039 Hypothyroidism, unspecified: Secondary | ICD-10-CM | POA: Insufficient documentation

## 2012-09-16 DIAGNOSIS — Z862 Personal history of diseases of the blood and blood-forming organs and certain disorders involving the immune mechanism: Secondary | ICD-10-CM | POA: Insufficient documentation

## 2012-09-16 DIAGNOSIS — L03012 Cellulitis of left finger: Secondary | ICD-10-CM

## 2012-09-16 DIAGNOSIS — Z87891 Personal history of nicotine dependence: Secondary | ICD-10-CM | POA: Insufficient documentation

## 2012-09-16 DIAGNOSIS — E079 Disorder of thyroid, unspecified: Secondary | ICD-10-CM | POA: Diagnosis not present

## 2012-09-16 DIAGNOSIS — Z853 Personal history of malignant neoplasm of breast: Secondary | ICD-10-CM | POA: Diagnosis not present

## 2012-09-16 DIAGNOSIS — L03019 Cellulitis of unspecified finger: Secondary | ICD-10-CM | POA: Insufficient documentation

## 2012-09-16 DIAGNOSIS — Z87898 Personal history of other specified conditions: Secondary | ICD-10-CM | POA: Diagnosis not present

## 2012-09-16 DIAGNOSIS — S6980XA Other specified injuries of unspecified wrist, hand and finger(s), initial encounter: Secondary | ICD-10-CM | POA: Diagnosis not present

## 2012-09-16 DIAGNOSIS — Z79899 Other long term (current) drug therapy: Secondary | ICD-10-CM | POA: Insufficient documentation

## 2012-09-16 DIAGNOSIS — Z7982 Long term (current) use of aspirin: Secondary | ICD-10-CM | POA: Insufficient documentation

## 2012-09-16 DIAGNOSIS — S6990XA Unspecified injury of unspecified wrist, hand and finger(s), initial encounter: Secondary | ICD-10-CM | POA: Diagnosis not present

## 2012-09-16 MED ORDER — CEPHALEXIN 500 MG PO CAPS: 500.0000 mg | ORAL_CAPSULE | Freq: Two times a day (BID) | ORAL | Status: DC

## 2012-09-16 NOTE — ED Provider Notes (Signed)
History    This chart was scribed for non-physician practitioner, Darlene Mussel PA-C working with Darlene Octave, MD by Darlene [name], ED Scribe. This patient was seen in room TR07C/TR07C and the patient's care was started at 2203.   CSN: [id number]  Arrival date & time 09/16/12  2028   First MD Initiated Contact with Patient 09/16/12 2203      Chief Complaint  Patient presents with  . Finger Injury    The history is provided by the patient. No language interpreter was used.   HPI Comments: Darlene [name] is a 78 y.o. female who presents to the Emergency Department complaining of sudden onset, gradually worsening, moderate left index finger pain described as throbbing that radiates up her arm and began last night when she crushed her finger in a wheelchair. She states that she bites her fingernails and when she did this it drew blood from the same finger. She has tried neosporin, soaking it in warm salt water and Tylenol with little relief.  Past Medical History  Diagnosis Date  . Thyroid disease   . Cancer of breast   . Lymphoma   . Breast cancer, stage 1, estrogen receptor negative 07/06/2011  . Lymphoma of lymph nodes of head, face, and/or neck 07/06/2011  . Hypothyroid 07/06/2011  . Chronic ITP (idiopathic thrombocytopenia) 07/06/2011    History reviewed. No pertinent past surgical history.  History reviewed. No pertinent family history.  History  Substance Use Topics  . Smoking status: Former Smoker -- 0.30 packs/day    Types: Cigarettes    Quit date: 09/29/1981  . Smokeless tobacco: Never Used  . Alcohol Use: Yes     Review of Systems  Musculoskeletal: Positive for arthralgias.  All other systems reviewed and are negative.    Allergies  Codeine; Doxycycline; Meperidine and related; and Sulfa antibiotics  Home Medications   Current Outpatient Rx  Name  Route  Sig  Dispense  Refill  . acetaminophen (TYLENOL) 500 MG tablet   Oral   Take 500 mg by mouth  every 6 (six) hours as needed. For pain          . aspirin 81 MG chewable tablet   Oral   Chew 81 mg by mouth daily.           . Calcium Carbonate-Vitamin D (CALCIUM 600+D) 600-200 MG-UNIT TABS   Oral   Take 1 tablet by mouth 2 (two) times daily.          . cholecalciferol (VITAMIN D) 1000 UNITS tablet   Oral   Take 1,000 Units by mouth daily.           . citalopram (CELEXA) 10 MG tablet   Oral   Take 10 mg by mouth daily.           Marland Kitchen esomeprazole (NEXIUM) 40 MG capsule   Oral   Take 40 mg by mouth daily with supper.          . famciclovir (FAMVIR) 250 MG tablet   Oral   Take 500 mg by mouth 2 (two) times daily as needed (for herpes breakout).         Marland Kitchen levothyroxine (SYNTHROID, LEVOTHROID) 75 MCG tablet   Oral   Take 75 mcg by mouth daily.           Marland Kitchen lovastatin (MEVACOR) 20 MG tablet   Oral   Take 20 mg by mouth at bedtime.           Marland Kitchen  meloxicam (MOBIC) 7.5 MG tablet   Oral   Take 7.5 mg by mouth daily.          . metoprolol tartrate (LOPRESSOR) 25 MG tablet   Oral   Take 25 mg by mouth daily.          . Multiple Vitamins-Minerals (MULTIVITAMINS THER. W/MINERALS) TABS   Oral   Take 1 tablet by mouth daily.           . Vitamin D, Ergocalciferol, (DRISDOL) 50000 UNITS CAPS   Oral   Take 50,000 Units by mouth every 14 (fourteen) days.             BP 151/78  Pulse 67  Temp(Src) 97.9 F (36.6 C) (Oral)  Resp 18  SpO2 98%  Physical Exam  Nursing note and vitals reviewed. Constitutional: She is oriented to person, place, and time. She appears well-developed and well-nourished. No distress.  HENT:  Head: Normocephalic and atraumatic.  Eyes: EOM are normal.  Neck: Neck supple. No tracheal deviation present.  Cardiovascular: Normal rate.   Pulmonary/Chest: Effort normal. No respiratory distress.  Musculoskeletal: Normal range of motion.  Neurological: She is alert and oriented to person, place, and time.  Skin: Skin is warm and  dry.  perionychia to the left index finger. Nail bitten down along the nail line. Swelling and redness to the tip of the finger tender to palpation. Full ROM of the finger  Psychiatric: She has a normal mood and affect. Her behavior is normal.    ED Course  Procedures (including critical care time) DIAGNOSTIC STUDIES: Oxygen Saturation is 98% on room air, normal by my interpretation.    COORDINATION OF CARE: 10:54 PM Discussed treatment plan which includes draining the pus pocket under the nail with pt at bedside and pt agreed to plan. Will prescribe an antibiotic. Advised pt to take Tylenol for pain and soak finger in antibacterial soap several times a day.  INCISION AND DRAINAGE Performed by: Raidon Swanner A Siddhartha Hoback PA-C Consent: Verbal consent obtained. Risks and benefits: risks, benefits and alternatives were discussed Type: abscess  Body area: left index finger  Anesthesia: local infiltration  Incision was made with a scalpel.  Local anesthetic: none  Anesthetic total: none  Complexity: complex Blunt dissection to break up loculations  Drainage: purulent and bloody  Drainage amount: moderate  Packing material: none  Patient tolerance: Patient tolerated the procedure well with no immediate complications.     Labs Reviewed - No data to display Dg Hand Complete Left  09/16/2012   *RADIOLOGY REPORT*  Clinical Data: Rule finger injury  LEFT HAND - COMPLETE 3+ VIEW  Comparison: None  Findings: The bones are osteopenic.  There is moderate osteoarthritis involving the basilar joint. There is no fracture or subluxation.  IMPRESSION:  1.  No acute findings.   Original Report Authenticated By: Darlene [name], M.D.     1. Paronychia, left       MDM  PT with paronychia to the finger, suspect from infected cuticle, pt does bite her nails. I&Ded with some purulent drainage. Will start on keflex at home, wound care, soaks, follow up with primary care doctor for recheck. Xray  negative. No evidence of felon or tenosynovitis. Pt is in no distress.  I personally performed the services described in this documentation, which was scribed in my presence. The recorded information has been reviewed and is accurate.        Darlene Mussel, PA-C 09/17/12 0010

## 2012-09-16 NOTE — ED Notes (Signed)
Pt states that she mashed her left pointer finger yesterday with a wheelchair and it started hurting last night. Pt states that she also bite her fingernail same finger and drew blood. Pt has a bruised appears to tip of left finger.

## 2012-09-17 NOTE — ED Provider Notes (Signed)
Medical screening examination/treatment/procedure(s) were performed by non-physician practitioner and as supervising physician I was immediately available for consultation/collaboration.   Glynn Octave, MD 09/17/12 [phone]

## 2012-09-20 DIAGNOSIS — M810 Age-related osteoporosis without current pathological fracture: Secondary | ICD-10-CM | POA: Diagnosis not present

## 2012-09-20 DIAGNOSIS — E785 Hyperlipidemia, unspecified: Secondary | ICD-10-CM | POA: Diagnosis not present

## 2012-09-20 DIAGNOSIS — L03019 Cellulitis of unspecified finger: Secondary | ICD-10-CM | POA: Diagnosis not present

## 2012-09-20 DIAGNOSIS — E039 Hypothyroidism, unspecified: Secondary | ICD-10-CM | POA: Diagnosis not present

## 2012-09-20 DIAGNOSIS — L02519 Cutaneous abscess of unspecified hand: Secondary | ICD-10-CM | POA: Diagnosis not present

## 2012-09-21 ENCOUNTER — Ambulatory Visit: Payer: Medicare Other

## 2012-09-29 ENCOUNTER — Ambulatory Visit
Admission: RE | Admit: 2012-09-29 | Discharge: 2012-09-29 | Disposition: A | Discharge status: Home / Self Care | Payer: Medicare Other | Source: Ambulatory Visit | Attending: Nurse Practitioner | Admitting: Nurse Practitioner

## 2012-09-29 DIAGNOSIS — C50911 Malignant neoplasm of unspecified site of right female breast: Secondary | ICD-10-CM

## 2012-09-29 DIAGNOSIS — Z1231 Encounter for screening mammogram for malignant neoplasm of breast: Secondary | ICD-10-CM | POA: Diagnosis not present

## 2012-09-30 ENCOUNTER — Other Ambulatory Visit: Payer: Self-pay | Admitting: Nurse Practitioner

## 2012-09-30 DIAGNOSIS — R928 Other abnormal and inconclusive findings on diagnostic imaging of breast: Secondary | ICD-10-CM

## 2012-10-03 DIAGNOSIS — H6120 Impacted cerumen, unspecified ear: Secondary | ICD-10-CM | POA: Diagnosis not present

## 2012-10-03 DIAGNOSIS — H903 Sensorineural hearing loss, bilateral: Secondary | ICD-10-CM | POA: Diagnosis not present

## 2012-10-03 DIAGNOSIS — H608X9 Other otitis externa, unspecified ear: Secondary | ICD-10-CM | POA: Diagnosis not present

## 2012-10-04 ENCOUNTER — Other Ambulatory Visit: Payer: Self-pay | Admitting: Nurse Practitioner

## 2012-10-04 DIAGNOSIS — R928 Other abnormal and inconclusive findings on diagnostic imaging of breast: Secondary | ICD-10-CM

## 2012-10-04 DIAGNOSIS — H01009 Unspecified blepharitis unspecified eye, unspecified eyelid: Secondary | ICD-10-CM | POA: Diagnosis not present

## 2012-10-14 ENCOUNTER — Ambulatory Visit
Admission: RE | Admit: 2012-10-14 | Discharge: 2012-10-14 | Disposition: A | Discharge status: Home / Self Care | Payer: Medicare Other | Source: Ambulatory Visit | Attending: Nurse Practitioner | Admitting: Nurse Practitioner

## 2012-10-14 DIAGNOSIS — R928 Other abnormal and inconclusive findings on diagnostic imaging of breast: Secondary | ICD-10-CM | POA: Diagnosis not present

## 2012-10-14 DIAGNOSIS — N6009 Solitary cyst of unspecified breast: Secondary | ICD-10-CM | POA: Diagnosis not present

## 2012-10-18 DIAGNOSIS — Z1331 Encounter for screening for depression: Secondary | ICD-10-CM | POA: Diagnosis not present

## 2012-10-18 DIAGNOSIS — E669 Obesity, unspecified: Secondary | ICD-10-CM | POA: Diagnosis not present

## 2012-10-18 DIAGNOSIS — Z0000 Encounter for general adult medical examination without abnormal findings: Secondary | ICD-10-CM | POA: Diagnosis not present

## 2012-10-18 DIAGNOSIS — E785 Hyperlipidemia, unspecified: Secondary | ICD-10-CM | POA: Diagnosis not present

## 2012-10-19 DIAGNOSIS — M810 Age-related osteoporosis without current pathological fracture: Secondary | ICD-10-CM | POA: Diagnosis not present

## 2012-10-19 DIAGNOSIS — Z124 Encounter for screening for malignant neoplasm of cervix: Secondary | ICD-10-CM | POA: Diagnosis not present

## 2012-10-28 DIAGNOSIS — M810 Age-related osteoporosis without current pathological fracture: Secondary | ICD-10-CM | POA: Diagnosis not present

## 2012-10-31 DIAGNOSIS — E785 Hyperlipidemia, unspecified: Secondary | ICD-10-CM | POA: Diagnosis not present

## 2012-10-31 DIAGNOSIS — F411 Generalized anxiety disorder: Secondary | ICD-10-CM | POA: Diagnosis not present

## 2012-10-31 DIAGNOSIS — M810 Age-related osteoporosis without current pathological fracture: Secondary | ICD-10-CM | POA: Diagnosis not present

## 2012-10-31 DIAGNOSIS — E039 Hypothyroidism, unspecified: Secondary | ICD-10-CM | POA: Diagnosis not present

## 2012-11-02 ENCOUNTER — Other Ambulatory Visit: Payer: Self-pay | Admitting: Obstetrics

## 2012-11-02 DIAGNOSIS — R109 Unspecified abdominal pain: Secondary | ICD-10-CM | POA: Diagnosis not present

## 2012-11-16 DIAGNOSIS — M79609 Pain in unspecified limb: Secondary | ICD-10-CM | POA: Diagnosis not present

## 2012-11-16 DIAGNOSIS — B351 Tinea unguium: Secondary | ICD-10-CM | POA: Diagnosis not present

## 2012-11-19 ENCOUNTER — Ambulatory Visit (INDEPENDENT_AMBULATORY_CARE_PROVIDER_SITE_OTHER): Payer: Medicare Other | Admitting: Family Medicine

## 2012-11-19 VITALS — BP 128/78 | HR 70 | Temp 98.4°F | Resp 16 | Ht 63.0 in | Wt 163.6 lb

## 2012-11-19 DIAGNOSIS — L03011 Cellulitis of right finger: Secondary | ICD-10-CM

## 2012-11-19 DIAGNOSIS — M25549 Pain in joints of unspecified hand: Secondary | ICD-10-CM

## 2012-11-19 DIAGNOSIS — L03019 Cellulitis of unspecified finger: Secondary | ICD-10-CM | POA: Diagnosis not present

## 2012-11-19 MED ORDER — AMOXICILLIN-POT CLAVULANATE 875-125 MG PO TABS: 1.0000 | ORAL_TABLET | Freq: Two times a day (BID) | ORAL | Status: DC

## 2012-11-19 NOTE — Progress Notes (Signed)
78 year old woman who was biting her nails and developed a right index finger paronychia of the last couple days. She's continued to try to express pus without success. She's also tried soaking it in antibacterial soap.  Patient's past medical history is significant for mastectomy with lymph node dissection on the right although she does not have significant edema in the right upper terminate.  Objective: No acute distress Examination of the right index finger reveals a right radial cuticle paronychia. The area was prepped with Betadine and incised and drained. There is minimal erythema and swelling in the distal phalanx adjacent to the pustular accumulation.  Assessment: Uncomplicated paronychia I&D  Plan: Augmentin 875 twice a day x7 days to be taken with food  Signed, Elvina Sidle

## 2012-11-19 NOTE — Patient Instructions (Signed)
Paronychia Paronychia is an inflammatory reaction involving the folds of the skin surrounding the fingernail. This is commonly caused by an infection in the skin around a nail. The most common cause of paronychia is frequent wetting of the hands (as seen with bartenders, food servers, nurses or others who wet their hands). This makes the skin around the fingernail susceptible to infection by bacteria (germs) or fungus. Other predisposing factors are:  Aggressive manicuring.  Nail biting.  Thumb sucking. The most common cause is a staphylococcal (a type of germ) infection, or a fungal (Candida) infection. When caused by a germ, it usually comes on suddenly with redness, swelling, pus and is often painful. It may get under the nail and form an abscess (collection of pus), or form an abscess around the nail. If the nail itself is infected with a fungus, the treatment is usually prolonged and may require oral medicine for up to one year. Your caregiver will determine the length of time treatment is required. The paronychia caused by bacteria (germs) may largely be avoided by not pulling on hangnails or picking at cuticles. When the infection occurs at the tips of the finger it is called felon. When the cause of paronychia is from the herpes simplex virus (HSV) it is called herpetic whitlow. TREATMENT  When an abscess is present treatment is often incision and drainage. This means that the abscess must be cut open so the pus can get out. When this is done, the following home care instructions should be followed. HOME CARE INSTRUCTIONS   It is important to keep the affected fingers very dry. Rubber or plastic gloves over cotton gloves should be used whenever the hand must be placed in water.  Keep wound clean, dry and dressed as suggested by your caregiver between warm soaks or warm compresses.  Soak in warm water for fifteen to twenty minutes three to four times per day for bacterial infections. Fungal  infections are very difficult to treat, so often require treatment for long periods of time.  For bacterial (germ) infections take antibiotics (medicine which kill germs) as directed and finish the prescription, even if the problem appears to be solved before the medicine is gone.  Only take over-the-counter or prescription medicines for pain, discomfort, or fever as directed by your caregiver. SEEK IMMEDIATE MEDICAL CARE IF:  You have redness, swelling, or increasing pain in the wound.  You notice pus coming from the wound.  You have a fever.  You notice a bad smell coming from the wound or dressing. Document Released: 10/14/2000 Document Revised: 07/13/2011 Document Reviewed: 06/15/2008 [employer] Patient Information 2014 [employer].  

## 2012-11-23 DIAGNOSIS — IMO0002 Reserved for concepts with insufficient information to code with codable children: Secondary | ICD-10-CM | POA: Diagnosis not present

## 2012-11-23 DIAGNOSIS — M1710 Unilateral primary osteoarthritis, unspecified knee: Secondary | ICD-10-CM | POA: Diagnosis not present

## 2012-11-25 DIAGNOSIS — M810 Age-related osteoporosis without current pathological fracture: Secondary | ICD-10-CM | POA: Diagnosis not present

## 2012-11-29 ENCOUNTER — Other Ambulatory Visit (HOSPITAL_COMMUNITY): Payer: Self-pay | Admitting: Obstetrics

## 2012-11-29 ENCOUNTER — Other Ambulatory Visit (HOSPITAL_COMMUNITY): Payer: Self-pay | Admitting: Internal Medicine

## 2012-11-29 ENCOUNTER — Ambulatory Visit (HOSPITAL_COMMUNITY)
Admission: RE | Admit: 2012-11-29 | Discharge: 2012-11-29 | Disposition: A | Discharge status: Home / Self Care | Payer: Medicare Other | Source: Ambulatory Visit | Attending: Internal Medicine | Admitting: Internal Medicine

## 2012-11-29 ENCOUNTER — Encounter (HOSPITAL_COMMUNITY): Payer: Self-pay

## 2012-11-29 DIAGNOSIS — M810 Age-related osteoporosis without current pathological fracture: Secondary | ICD-10-CM | POA: Insufficient documentation

## 2012-11-29 MED ORDER — ZOLEDRONIC ACID 5 MG/100ML IV SOLN
5.0000 mg | Freq: Once | INTRAVENOUS | Status: AC
  Administered 2012-11-29: 5 mg via INTRAVENOUS
  Filled 2012-11-29: qty 100

## 2012-11-29 MED ORDER — SODIUM CHLORIDE 0.9 % IV SOLN
Freq: Once | INTRAVENOUS | Status: AC
  Administered 2012-11-29: 20 mL/h via INTRAVENOUS

## 2012-12-02 ENCOUNTER — Inpatient Hospital Stay (HOSPITAL_COMMUNITY)
Admission: EM | Admit: 2012-12-02 | Discharge: 2012-12-06 | DRG: 287 | Disposition: A | Discharge status: Home / Self Care | Payer: Medicare Other | Attending: Cardiovascular Disease | Admitting: Cardiovascular Disease

## 2012-12-02 ENCOUNTER — Emergency Department (HOSPITAL_COMMUNITY): Payer: Medicare Other

## 2012-12-02 ENCOUNTER — Encounter (HOSPITAL_COMMUNITY): Payer: Self-pay | Admitting: Emergency Medicine

## 2012-12-02 DIAGNOSIS — R079 Chest pain, unspecified: Secondary | ICD-10-CM | POA: Diagnosis not present

## 2012-12-02 DIAGNOSIS — C50919 Malignant neoplasm of unspecified site of unspecified female breast: Secondary | ICD-10-CM | POA: Diagnosis present

## 2012-12-02 DIAGNOSIS — I10 Essential (primary) hypertension: Secondary | ICD-10-CM | POA: Diagnosis present

## 2012-12-02 DIAGNOSIS — M129 Arthropathy, unspecified: Secondary | ICD-10-CM | POA: Diagnosis present

## 2012-12-02 DIAGNOSIS — E785 Hyperlipidemia, unspecified: Secondary | ICD-10-CM | POA: Diagnosis not present

## 2012-12-02 DIAGNOSIS — M810 Age-related osteoporosis without current pathological fracture: Secondary | ICD-10-CM

## 2012-12-02 DIAGNOSIS — D693 Immune thrombocytopenic purpura: Secondary | ICD-10-CM | POA: Diagnosis present

## 2012-12-02 DIAGNOSIS — Z171 Estrogen receptor negative status [ER-]: Secondary | ICD-10-CM

## 2012-12-02 DIAGNOSIS — C8581 Other specified types of non-Hodgkin lymphoma, lymph nodes of head, face, and neck: Secondary | ICD-10-CM | POA: Diagnosis not present

## 2012-12-02 DIAGNOSIS — E039 Hypothyroidism, unspecified: Secondary | ICD-10-CM | POA: Diagnosis not present

## 2012-12-02 DIAGNOSIS — R002 Palpitations: Secondary | ICD-10-CM | POA: Diagnosis present

## 2012-12-02 DIAGNOSIS — Z853 Personal history of malignant neoplasm of breast: Secondary | ICD-10-CM

## 2012-12-02 DIAGNOSIS — Z8249 Family history of ischemic heart disease and other diseases of the circulatory system: Secondary | ICD-10-CM | POA: Diagnosis not present

## 2012-12-02 DIAGNOSIS — C8591 Non-Hodgkin lymphoma, unspecified, lymph nodes of head, face, and neck: Secondary | ICD-10-CM

## 2012-12-02 DIAGNOSIS — Z8572 Personal history of non-Hodgkin lymphomas: Secondary | ICD-10-CM | POA: Diagnosis present

## 2012-12-02 DIAGNOSIS — C8589 Other specified types of non-Hodgkin lymphoma, extranodal and solid organ sites: Secondary | ICD-10-CM | POA: Diagnosis present

## 2012-12-02 DIAGNOSIS — Z87891 Personal history of nicotine dependence: Secondary | ICD-10-CM

## 2012-12-02 DIAGNOSIS — K219 Gastro-esophageal reflux disease without esophagitis: Secondary | ICD-10-CM | POA: Diagnosis present

## 2012-12-02 DIAGNOSIS — I200 Unstable angina: Secondary | ICD-10-CM

## 2012-12-02 DIAGNOSIS — R072 Precordial pain: Secondary | ICD-10-CM | POA: Diagnosis not present

## 2012-12-02 LAB — CBC
Platelets: 232 10*3/uL (ref 150–400)
RDW: 13.2 % (ref 11.5–15.5)
WBC: 8.3 10*3/uL (ref 4.0–10.5)

## 2012-12-02 LAB — BASIC METABOLIC PANEL
Chloride: 105 mEq/L (ref 96–112)
GFR calc Af Amer: >90 mL/min (ref >90)
Potassium: 4.2 mEq/L (ref 3.5–5.1)
Sodium: 138 mEq/L (ref 135–145)

## 2012-12-02 LAB — POCT I-STAT TROPONIN I: Troponin i, poc: 0 ng/mL (ref 0.00–0.08)

## 2012-12-02 MED ORDER — IOHEXOL 350 MG/ML SOLN: 100.0000 mL | Freq: Once | INTRAVENOUS | Status: DC | PRN

## 2012-12-02 MED ORDER — IOHEXOL 350 MG/ML SOLN
100.0000 mL | Freq: Once | INTRAVENOUS | Status: AC | PRN
  Administered 2012-12-02: 100 mL via INTRAVENOUS

## 2012-12-02 NOTE — ED Provider Notes (Signed)
CSN: [id number]     Arrival date & time 12/02/12  1749 History     First MD Initiated Contact with Patient 12/02/12 1821     Chief Complaint  Patient presents with  . Chest Pain   (Consider location/radiation/quality/duration/timing/severity/associated sxs/prior Treatment) HPI Comments: Patient presents to the ER for evaluation of severe chest pain. Patient reports that she had a heaviness in the center of her chest that radiated up into her throat when she woke from a nap at 4 PM. She says that the pain improved on its own, but still there is some slight discomfort there. She has never had similar symptoms before.  Patient is a 78 y.o. female presenting with chest pain.  Chest Pain   Past Medical History  Diagnosis Date  . Thyroid disease   . Cancer of breast   . Lymphoma   . Breast cancer, stage 1, estrogen receptor negative 07/06/2011  . Lymphoma of lymph nodes of head, face, and/or neck 07/06/2011  . Hypothyroid 07/06/2011  . Chronic ITP (idiopathic thrombocytopenia) 07/06/2011   Past Surgical History  Procedure Laterality Date  . Breast surgery    . Knee surgery     Family History  Problem Relation Age of Onset  . Hyperlipidemia Father   . Heart disease Father   . Heart disease Sister   . Cancer Brother   . Diabetes Son   . Heart disease Maternal Grandfather    History  Substance Use Topics  . Smoking status: Former Smoker -- 0.30 packs/day    Types: Cigarettes    Quit date: 09/29/1981  . Smokeless tobacco: Never Used  . Alcohol Use: Yes   OB History   Grav Para Term Preterm Abortions TAB SAB Ect Mult Living                 Review of Systems  Cardiovascular: Positive for chest pain.  All other systems reviewed and are negative.    Allergies  Codeine; Doxycycline; Meperidine and related; and Sulfa antibiotics  Home Medications   Current Outpatient Rx  Name  Route  Sig  Dispense  Refill  . acetaminophen (TYLENOL) 500 MG tablet   Oral   Take 500 mg by  mouth every 6 (six) hours as needed. For pain          . amoxicillin-clavulanate (AUGMENTIN) 875-125 MG per tablet   Oral   Take 1 tablet by mouth 2 (two) times daily.   20 tablet   0   . aspirin 81 MG chewable tablet   Oral   Chew 81 mg by mouth daily.           . Calcium Carbonate-Vitamin D (CALCIUM 600+D) 600-200 MG-UNIT TABS   Oral   Take 1 tablet by mouth 2 (two) times daily.          . cholecalciferol (VITAMIN D) 1000 UNITS tablet   Oral   Take 1,000 Units by mouth daily.           . citalopram (CELEXA) 10 MG tablet   Oral   Take 10 mg by mouth daily.           Marland Kitchen esomeprazole (NEXIUM) 40 MG capsule   Oral   Take 40 mg by mouth daily with supper.          . famciclovir (FAMVIR) 250 MG tablet   Oral   Take 500 mg by mouth 2 (two) times daily as needed (for herpes breakout).         Marland Kitchen  levothyroxine (SYNTHROID, LEVOTHROID) 75 MCG tablet   Oral   Take 75 mcg by mouth daily.           Marland Kitchen lovastatin (MEVACOR) 20 MG tablet   Oral   Take 20 mg by mouth at bedtime.           . meloxicam (MOBIC) 7.5 MG tablet   Oral   Take 7.5 mg by mouth daily.          . metoprolol tartrate (LOPRESSOR) 25 MG tablet   Oral   Take 25 mg by mouth daily.          . Multiple Vitamins-Minerals (MULTIVITAMINS THER. W/MINERALS) TABS   Oral   Take 1 tablet by mouth daily.           . Vitamin D, Ergocalciferol, (DRISDOL) 50000 UNITS CAPS   Oral   Take 50,000 Units by mouth every 14 (fourteen) days.            BP 101/58  Pulse 77  Temp(Src) 98 F (36.7 C) (Oral)  Resp 18  SpO2 95% Physical Exam  Constitutional: She is oriented to person, place, and time. She appears well-developed and well-nourished. No distress.  HENT:  Head: Normocephalic and atraumatic.  Right Ear: Hearing normal.  Left Ear: Hearing normal.  Nose: Nose normal.  Mouth/Throat: Oropharynx is clear and moist and mucous membranes are normal.  Eyes: Conjunctivae and EOM are normal. Pupils  are equal, round, and reactive to light.  Neck: Normal range of motion. Neck supple.  Cardiovascular: Regular rhythm, S1 normal and S2 normal.  Exam reveals no gallop and no friction rub.   No murmur heard. Pulmonary/Chest: Effort normal and breath sounds normal. No respiratory distress. She exhibits no tenderness.  Abdominal: Soft. Normal appearance and bowel sounds are normal. There is no hepatosplenomegaly. There is no tenderness. There is no rebound, no guarding, no tenderness at McBurney's point and negative Murphy's sign. No hernia.  Musculoskeletal: Normal range of motion.  Neurological: She is alert and oriented to person, place, and time. She has normal strength. No cranial nerve deficit or sensory deficit. Coordination normal. GCS eye subscore is 4. GCS verbal subscore is 5. GCS motor subscore is 6.  Skin: Skin is warm, dry and intact. No rash noted. No cyanosis.  Psychiatric: She has a normal mood and affect. Her speech is normal and behavior is normal. Thought content normal.    ED Course   Procedures (including critical care time)   Date: 12/02/2012  Rate: 78  Rhythm: normal sinus rhythm  QRS Axis: normal  Intervals: normal  ST/T Wave abnormalities: normal  Conduction Disutrbances: none  Narrative Interpretation: unremarkable      Labs Reviewed  CBC  BASIC METABOLIC PANEL  POCT I-STAT TROPONIN I   Dg Chest 2 View  12/02/2012   *RADIOLOGY REPORT*  Clinical Data: Chest pain  CHEST - 2 VIEW  Comparison: 04/13/2011  Findings: The heart and pulmonary vascularity are within normal limits.  Postsurgical changes are again noted on the left.  The lungs are clear.  No sizable effusion is seen.  No acute bony abnormality is noted.  IMPRESSION: No acute abnormalities seen.   Original Report Authenticated By: Alcide Clever, M.D.   Diagnosis: Chest pain  MDM  He presents to the ER with complaints of substernal chest pain that radiated up into her throat. Patient does not have any  history of coronary artery disease. She did have mild shortness of breath associated with the  symptoms, no nausea or diaphoresis. Symptoms have resolved spontaneously. Initial workup was unremarkable.  The patient does have a cancer history and she has shortness of breath, CT scan was performed. No evidence of PE. She does have multiple lymph nodes that will need to be followed as an outpatient.  Patient will require hospitalization for further management and evaluation of chest pain, cardiac rule out.  Gilda Crease, MD 12/02/12 [phone]

## 2012-12-02 NOTE — ED Notes (Signed)
Pt c/o mid sternal CP starting this afternoon waking from sleep that is now resolved; pt sts some SOB but denies nausea

## 2012-12-02 NOTE — ED Notes (Signed)
Pollina, MD at bedside 

## 2012-12-03 ENCOUNTER — Encounter (HOSPITAL_COMMUNITY): Payer: Self-pay | Admitting: Internal Medicine

## 2012-12-03 DIAGNOSIS — M810 Age-related osteoporosis without current pathological fracture: Secondary | ICD-10-CM | POA: Diagnosis present

## 2012-12-03 DIAGNOSIS — C8581 Other specified types of non-Hodgkin lymphoma, lymph nodes of head, face, and neck: Secondary | ICD-10-CM | POA: Diagnosis not present

## 2012-12-03 DIAGNOSIS — E039 Hypothyroidism, unspecified: Secondary | ICD-10-CM | POA: Diagnosis not present

## 2012-12-03 DIAGNOSIS — E785 Hyperlipidemia, unspecified: Secondary | ICD-10-CM | POA: Diagnosis present

## 2012-12-03 DIAGNOSIS — R079 Chest pain, unspecified: Secondary | ICD-10-CM | POA: Diagnosis not present

## 2012-12-03 DIAGNOSIS — I200 Unstable angina: Secondary | ICD-10-CM | POA: Diagnosis present

## 2012-12-03 DIAGNOSIS — R002 Palpitations: Secondary | ICD-10-CM | POA: Diagnosis present

## 2012-12-03 LAB — CBC
Hemoglobin: 12.1 g/dL (ref 12.0–15.0)
MCH: 30 pg (ref 26.0–34.0)
RBC: 4.04 MIL/uL (ref 3.87–5.11)

## 2012-12-03 LAB — BASIC METABOLIC PANEL
Chloride: 105 mEq/L (ref 96–112)
GFR calc non Af Amer: 88 mL/min — ABNORMAL LOW (ref >90)
Glucose, Bld: 103 mg/dL — ABNORMAL HIGH (ref 70–99)
Potassium: 3.3 mEq/L — ABNORMAL LOW (ref 3.5–5.1)
Sodium: 140 mEq/L (ref 135–145)

## 2012-12-03 LAB — HEPARIN LEVEL (UNFRACTIONATED): Heparin Unfractionated: 1.5 IU/mL — ABNORMAL HIGH (ref 0.30–0.70)

## 2012-12-03 LAB — TROPONIN I: Troponin I: <0.3 ng/mL (ref <0.30)

## 2012-12-03 MED ORDER — CALCIUM CARBONATE-VITAMIN D 600-200 MG-UNIT PO TABS: 1.0000 | ORAL_TABLET | Freq: Two times a day (BID) | ORAL | Status: DC

## 2012-12-03 MED ORDER — ACETAMINOPHEN 325 MG PO TABS
650.0000 mg | ORAL_TABLET | Freq: Four times a day (QID) | ORAL | Status: DC | PRN
  Administered 2012-12-03 – 2012-12-06 (×6): 650 mg via ORAL
  Filled 2012-12-03 (×6): qty 2

## 2012-12-03 MED ORDER — LEVOTHYROXINE SODIUM 75 MCG PO TABS
75.0000 ug | ORAL_TABLET | Freq: Every day | ORAL | Status: DC
  Administered 2012-12-03 – 2012-12-06 (×4): 75 ug via ORAL
  Filled 2012-12-03 (×6): qty 1

## 2012-12-03 MED ORDER — ADULT MULTIVITAMIN W/MINERALS CH
1.0000 | ORAL_TABLET | Freq: Every day | ORAL | Status: DC
  Administered 2012-12-04 – 2012-12-06 (×3): 1 via ORAL
  Filled 2012-12-03 (×4): qty 1

## 2012-12-03 MED ORDER — ONDANSETRON HCL 4 MG/2ML IJ SOLN: 4.0000 mg | Freq: Four times a day (QID) | INTRAMUSCULAR | Status: DC | PRN

## 2012-12-03 MED ORDER — ENOXAPARIN SODIUM 40 MG/0.4ML ~~LOC~~ SOLN
40.0000 mg | SUBCUTANEOUS | Status: DC
  Filled 2012-12-03: qty 0.4

## 2012-12-03 MED ORDER — NITROGLYCERIN 2 % TD OINT
0.5000 [in_us] | TOPICAL_OINTMENT | Freq: Four times a day (QID) | TRANSDERMAL | Status: DC
  Administered 2012-12-03 – 2012-12-05 (×8): 0.5 [in_us] via TOPICAL
  Filled 2012-12-03: qty 30

## 2012-12-03 MED ORDER — HEPARIN (PORCINE) IN NACL 100-0.45 UNIT/ML-% IJ SOLN
1000.0000 [IU]/h | INTRAMUSCULAR | Status: DC
  Administered 2012-12-04 – 2012-12-05 (×2): 1000 [IU]/h via INTRAVENOUS
  Filled 2012-12-03 (×5): qty 250

## 2012-12-03 MED ORDER — PANTOPRAZOLE SODIUM 40 MG PO TBEC
40.0000 mg | DELAYED_RELEASE_TABLET | Freq: Every day | ORAL | Status: DC
  Administered 2012-12-03 – 2012-12-06 (×4): 40 mg via ORAL
  Filled 2012-12-03 (×4): qty 1

## 2012-12-03 MED ORDER — ACETAMINOPHEN 650 MG RE SUPP: 650.0000 mg | Freq: Four times a day (QID) | RECTAL | Status: DC | PRN

## 2012-12-03 MED ORDER — CALCIUM CARBONATE-VITAMIN D 500-200 MG-UNIT PO TABS
1.0000 | ORAL_TABLET | Freq: Two times a day (BID) | ORAL | Status: DC
  Administered 2012-12-03 – 2012-12-06 (×7): 1 via ORAL
  Filled 2012-12-03 (×9): qty 1

## 2012-12-03 MED ORDER — ENOXAPARIN SODIUM 80 MG/0.8ML ~~LOC~~ SOLN
70.0000 mg | Freq: Two times a day (BID) | SUBCUTANEOUS | Status: DC
  Filled 2012-12-03 (×2): qty 0.8

## 2012-12-03 MED ORDER — POLYETHYL GLYCOL-PROPYL GLYCOL 0.4-0.3 % OP SOLN: 2.0000 [drp] | Freq: Two times a day (BID) | OPHTHALMIC | Status: DC | PRN

## 2012-12-03 MED ORDER — VITAMIN D (ERGOCALCIFEROL) 1.25 MG (50000 UNIT) PO CAPS: 50000.0000 [IU] | ORAL_CAPSULE | ORAL | Status: DC

## 2012-12-03 MED ORDER — ASPIRIN 81 MG PO CHEW
81.0000 mg | CHEWABLE_TABLET | Freq: Every day | ORAL | Status: DC
  Administered 2012-12-03 – 2012-12-06 (×4): 81 mg via ORAL
  Filled 2012-12-03 (×4): qty 1

## 2012-12-03 MED ORDER — METOPROLOL TARTRATE 25 MG PO TABS
25.0000 mg | ORAL_TABLET | Freq: Every day | ORAL | Status: DC
  Administered 2012-12-03 – 2012-12-06 (×4): 25 mg via ORAL
  Filled 2012-12-03 (×4): qty 1

## 2012-12-03 MED ORDER — POLYVINYL ALCOHOL 1.4 % OP SOLN
1.0000 [drp] | Freq: Two times a day (BID) | OPHTHALMIC | Status: DC | PRN
  Filled 2012-12-03: qty 15

## 2012-12-03 MED ORDER — CITALOPRAM HYDROBROMIDE 10 MG PO TABS
10.0000 mg | ORAL_TABLET | Freq: Every day | ORAL | Status: DC
  Administered 2012-12-03 – 2012-12-06 (×4): 10 mg via ORAL
  Filled 2012-12-03 (×4): qty 1

## 2012-12-03 MED ORDER — HEPARIN BOLUS VIA INFUSION
2000.0000 [IU] | Freq: Once | INTRAVENOUS | Status: AC
  Administered 2012-12-03: 2000 [IU] via INTRAVENOUS
  Filled 2012-12-03: qty 2000

## 2012-12-03 MED ORDER — SODIUM CHLORIDE 0.9 % IJ SOLN
3.0000 mL | Freq: Two times a day (BID) | INTRAMUSCULAR | Status: DC
  Administered 2012-12-03 – 2012-12-05 (×6): 3 mL via INTRAVENOUS

## 2012-12-03 MED ORDER — HEPARIN (PORCINE) IN NACL 100-0.45 UNIT/ML-% IJ SOLN
800.0000 [IU]/h | INTRAMUSCULAR | Status: DC
  Administered 2012-12-03: 800 [IU]/h via INTRAVENOUS
  Filled 2012-12-03: qty 250

## 2012-12-03 MED ORDER — HEPARIN BOLUS VIA INFUSION
4000.0000 [IU] | Freq: Once | INTRAVENOUS | Status: AC
  Administered 2012-12-03: 4000 [IU] via INTRAVENOUS
  Filled 2012-12-03: qty 4000

## 2012-12-03 MED ORDER — POTASSIUM CHLORIDE CRYS ER 20 MEQ PO TBCR
20.0000 meq | EXTENDED_RELEASE_TABLET | Freq: Once | ORAL | Status: AC
  Administered 2012-12-03: 20 meq via ORAL
  Filled 2012-12-03: qty 1

## 2012-12-03 MED ORDER — SIMVASTATIN 10 MG PO TABS
10.0000 mg | ORAL_TABLET | Freq: Every day | ORAL | Status: DC
  Administered 2012-12-03 – 2012-12-05 (×3): 10 mg via ORAL
  Filled 2012-12-03 (×4): qty 1

## 2012-12-03 MED ORDER — ONDANSETRON HCL 4 MG PO TABS: 4.0000 mg | ORAL_TABLET | Freq: Four times a day (QID) | ORAL | Status: DC | PRN

## 2012-12-03 NOTE — Progress Notes (Signed)
ANTICOAGULATION CONSULT NOTE - Follow Up Consult  Pharmacy Consult for heparin Indication: chest pain/ACS  Allergies  Allergen Reactions  . Codeine Itching           . Doxycycline Swelling and Other (See Comments)    Burns throat also   . Meperidine And Related Itching and Rash    Makes cheeks red also  . Sulfa Antibiotics Itching    Patient Measurements: Height: 5\' 3"  (160 cm) Weight: 157 lb 11.2 oz (71.532 kg) IBW/kg (Calculated) : 52.4 Heparin Dosing Weight: 67.3kg  Vital Signs: Temp: 97.9 F (36.6 C) (08/02 2020) Temp src: Oral (08/02 1405) BP: 117/77 mmHg (08/02 2020) Pulse Rate: 68 (08/02 2020)  Labs:  Recent Labs  12/02/12 1800 12/03/12 0145 12/03/12 0410 12/03/12 0858 12/03/12 1814 12/03/12 2017  HGB 12.6  --  12.1  --   --   --   HCT 36.4  --  36.7  --   --   --   PLT 232  --  205  --   --   --   HEPARINUNFRC  --   --   --   --  1.50* 0.15*  CREATININE 0.56  --  0.56  --   --   --   TROPONINI  --  <0.30  --  <0.30  --   --     Estimated Creatinine Clearance: 56.7 ml/min (by C-G formula based on Cr of 0.56).   Assessment: 78 YOF started on heparin for chest pain. When first check was drawn, it was taken from same hand as the infusion was running in, so it was falsely elevated. A correct recheck was done and resulted low at 0.15. Per RN, no issues with line and no bleeding noted.   Goal of Therapy:  Heparin level 0.3-0.7 units/ml Monitor platelets by anticoagulation protocol: Yes   Plan:  1. Heparin bolus 2000units IV x1 2. Increase heparin gtt to 1000units/hr 3. Heparin level at 0600 4. Daily HL and CBC 5. F/u signs of bleeding and cath plans  Tilden Broz D. Florenda Watt, PharmD Clinical Pharmacist Pager: [phone] 12/03/2012 9:18 PM

## 2012-12-03 NOTE — Consult Note (Signed)
Reason for Consult: Chest pain  Requesting Physician: [facility]  HPI: This is a 78 y.o. female with a past medical history significant for breats cancer and non Hodgkin's Lymphoma. She has no history of CAD or prior cardiac evaluation. She has treated dyslipidemia, and a family history of CAD. She was admitted last night with chest pain that awakened her from sleep. She describes SSCP-"pressure", "like a truck on my chest". EKG and Troponin are negative but she still has some chest discomfort.  PMHx:  Past Medical History  Diagnosis Date  . Thyroid disease   . Cancer of breast   . Lymphoma   . Breast cancer, stage 1, estrogen receptor negative 07/06/2011  . Lymphoma of lymph nodes of head, face, and/or neck 07/06/2011  . Hypothyroid 07/06/2011  . Chronic ITP (idiopathic thrombocytopenia) 07/06/2011   Past Surgical History  Procedure Laterality Date  . Breast surgery    . Knee surgery      FAMHx: Family History  Problem Relation Age of Onset  . Hyperlipidemia Father   . Heart disease Father   . Heart disease Sister   . Cancer Brother   . Diabetes Son   . Heart disease Maternal Grandfather     SOCHx:  reports that she quit smoking about 31 years ago. Her smoking use included Cigarettes. She smoked 0.30 packs per day. She has never used smokeless tobacco. She reports that  drinks alcohol. Her drug history is not on file.  ALLERGIES: Allergies  Allergen Reactions  . Codeine Itching           . Doxycycline Swelling and Other (See Comments)    Burns throat also   . Meperidine And Related Itching and Rash    Makes cheeks red also  . Sulfa Antibiotics Itching    ROS: Pertinent items are noted in HPI. She denies any orthopnea or pnd, no edema. She recently received Rclast injection but has never had chest pain afterwards. She has treated hypothyroidism. She has chronic ITP. She has had no recent fever, chills, cough. She denieas any nausea, diarrhea, or vomiting. She has  never had a stroke or TIA. She has had remote palpitations treated with beta blocker.  HOME MEDICATIONS: Prescriptions prior to admission  Medication Sig Dispense Refill  . acetaminophen (TYLENOL) 500 MG tablet Take 1,000 mg by mouth 3 (three) times daily. For pain      . aspirin 81 MG chewable tablet Chew 81 mg by mouth daily.        . Calcium Carbonate-Vitamin D (CALCIUM 600+D) 600-200 MG-UNIT TABS Take 1 tablet by mouth 2 (two) times daily.       . citalopram (CELEXA) 10 MG tablet Take 10 mg by mouth daily.        Marland Kitchen esomeprazole (NEXIUM) 40 MG capsule Take 40 mg by mouth daily with supper.       . famciclovir (FAMVIR) 250 MG tablet Take 500 mg by mouth 2 (two) times daily as needed (for herpes breakout).      Marland Kitchen levothyroxine (SYNTHROID, LEVOTHROID) 75 MCG tablet Take 75 mcg by mouth daily.        Marland Kitchen lovastatin (MEVACOR) 20 MG tablet Take 20 mg by mouth at bedtime.        . metoprolol tartrate (LOPRESSOR) 25 MG tablet Take 25 mg by mouth daily.       . Multiple Vitamins-Minerals (MULTIVITAMINS THER. W/MINERALS) TABS Take 1 tablet by mouth daily.        Marland Kitchen  Polyethyl Glycol-Propyl Glycol (SYSTANE) 0.4-0.3 % SOLN Apply 2 drops to eye 2 (two) times daily as needed (for dry eyes).      . Vitamin D, Ergocalciferol, (DRISDOL) 50000 UNITS CAPS Take 50,000 Units by mouth every 14 (fourteen) days.        . Zoledronic Acid (RECLAST IV) Inject 1 each into the vein once. Takes once yearly.  Last dose 12/01/12.      Marland Kitchen amoxicillin-clavulanate (AUGMENTIN) 875-125 MG per tablet Take 1 tablet by mouth 2 (two) times daily.  20 tablet  0    HOSPITAL MEDICATIONS: I have reviewed the patient's current medications.  VITALS: Blood pressure 112/71, pulse 80, temperature 97.8 F (36.6 C), temperature source Oral, resp. rate 18, height 5\' 3"  (1.6 m), weight 157 lb 11.2 oz (71.532 kg), SpO2 98.00%.  PHYSICAL EXAM: General appearance: alert, cooperative and no distress Neck: no carotid bruit Lungs: clear to  auscultation bilaterally Heart: regular rate and rhythm Abdomen: soft, non-tender; bowel sounds normal; no masses,  no organomegaly Extremities: extremities normal, atraumatic, no cyanosis or edema Pulses: 2+ and symmetric Skin: Skin color, texture, turgor normal. No rashes or lesions Neurologic: Grossly normal  LABS: Results for orders placed during the hospital encounter of 12/02/12 (from the past 48 hour(s))  CBC     Status: None   Collection Time    12/02/12  6:00 PM      Result Value Range   WBC 8.3  4.0 - 10.5 K/uL   RBC 4.02  3.87 - 5.11 MIL/uL   Hemoglobin 12.6  12.0 - 15.0 g/dL   HCT 16.1  09.6 - 04.5 %   MCV 90.5  78.0 - 100.0 fL   MCH 31.3  26.0 - 34.0 pg   MCHC 34.6  30.0 - 36.0 g/dL   RDW 40.9  81.1 - 91.4 %   Platelets 232  150 - 400 K/uL  BASIC METABOLIC PANEL     Status: Abnormal   Collection Time    12/02/12  6:00 PM      Result Value Range   Sodium 138  135 - 145 mEq/L   Potassium 4.2  3.5 - 5.1 mEq/L   Chloride 105  96 - 112 mEq/L   CO2 22  19 - 32 mEq/L   Glucose, Bld 101 (*) 70 - 99 mg/dL   BUN 16  6 - 23 mg/dL   Creatinine, Ser 7.82  0.50 - 1.10 mg/dL   Calcium 95.6  8.4 - 21.3 mg/dL   GFR calc non Af Amer 88 (*) >90 mL/min   GFR calc Af Amer >90  >90 mL/min   Comment:            The eGFR has been calculated     using the CKD EPI equation.     This calculation has not been     validated in all clinical     situations.     eGFR's persistently     <90 mL/min signify     possible Chronic Kidney Disease.  POCT I-STAT TROPONIN I     Status: None   Collection Time    12/02/12  6:11 PM      Result Value Range   Troponin i, poc 0.00  0.00 - 0.08 ng/mL   Comment 3            Comment: Due to the release kinetics of cTnI,     a negative result within the first hours     of  the onset of symptoms does not rule out     myocardial infarction with certainty.     If myocardial infarction is still suspected,     repeat the test at appropriate intervals.   TROPONIN I     Status: None   Collection Time    12/03/12  1:45 AM      Result Value Range   Troponin I <0.30  <0.30 ng/mL   Comment:            Due to the release kinetics of cTnI,     a negative result within the first hours     of the onset of symptoms does not rule out     myocardial infarction with certainty.     If myocardial infarction is still suspected,     repeat the test at appropriate intervals.  BASIC METABOLIC PANEL     Status: Abnormal   Collection Time    12/03/12  4:10 AM      Result Value Range   Sodium 140  135 - 145 mEq/L   Potassium 3.3 (*) 3.5 - 5.1 mEq/L   Chloride 105  96 - 112 mEq/L   CO2 24  19 - 32 mEq/L   Glucose, Bld 103 (*) 70 - 99 mg/dL   BUN 13  6 - 23 mg/dL   Creatinine, Ser 5.40  0.50 - 1.10 mg/dL   Calcium 9.8  8.4 - 98.1 mg/dL   GFR calc non Af Amer 88 (*) >90 mL/min   GFR calc Af Amer >90  >90 mL/min   Comment:            The eGFR has been calculated     using the CKD EPI equation.     This calculation has not been     validated in all clinical     situations.     eGFR's persistently     <90 mL/min signify     possible Chronic Kidney Disease.  CBC     Status: None   Collection Time    12/03/12  4:10 AM      Result Value Range   WBC 9.3  4.0 - 10.5 K/uL   RBC 4.04  3.87 - 5.11 MIL/uL   Hemoglobin 12.1  12.0 - 15.0 g/dL   HCT 19.1  47.8 - 29.5 %   MCV 90.8  78.0 - 100.0 fL   MCH 30.0  26.0 - 34.0 pg   MCHC 33.0  30.0 - 36.0 g/dL   RDW 62.1  30.8 - 65.7 %   Platelets 205  150 - 400 K/uL    EKG: NSR without acute changes  IMAGING: Dg Chest 2 View  12/02/2012   *RADIOLOGY REPORT*  Clinical Data: Chest pain  CHEST - 2 VIEW  Comparison: 04/13/2011  Findings: The heart and pulmonary vascularity are within normal limits.  Postsurgical changes are again noted on the left.  The lungs are clear.  No sizable effusion is seen.  No acute bony abnormality is noted.  IMPRESSION: No acute abnormalities seen.   Original Report Authenticated By:  Alcide Clever, M.D.   Ct Angio Chest Pe W/cm &/or Wo Cm  12/02/2012   *RADIOLOGY REPORT*  Clinical Data: Chest pain.  History of cancer.  CT ANGIOGRAPHY CHEST  Technique:  Multidetector CT imaging of the chest using the standard protocol during bolus administration of intravenous contrast. Multiplanar reconstructed images including MIPs were obtained and reviewed to evaluate the vascular anatomy.  Contrast:  OMNIPAQUE IOHEXOL 350 MG/ML SOLN  Comparison: 04/13/2011.  Findings:  THORACIC INLET/BODY WALL:  Right axillary dissection.  No adenopathy in the in this region.  Left supraclavicular lymphadenopathy, 10 x 17 mm as compared to 7 x 11 mm previously.  Lower right IJ node has likewise enlarged, 13 x 10 mm as compared to 9 x 14 mm previously.  MEDIASTINUM:  Cardiomegaly.  No pericardial effusion.  Coronary artery atherosclerosis with of the most notable plaque along the proximal LAD.  On single planning imaing, thin peripheral high density around the ascending aorta is likely artifactual or chronic/atherosclerotic given the circumferential nature and similar appearance of the descending aorta.  No pulmonary embolism identified.  Mediastinal adenopathy.  Right retrocrural node stable at 10 mm diameter.  A lower right periesophageal node has minimally enlarged, currently 11 x 8 mm as compared to 9 x 7 mm previously. Periesophageal node in the mid chest stable 17 x 10 mm.  LUNG WINDOWS:  No consolidation.  No effusion.  No suspicious pulmonary nodule.  UPPER ABDOMEN:  No acute findings.  OSSEOUS:  No acute fracture.  No suspicious lytic or blastic lesions.  IMPRESSION: 1. Negative for pulmonary embolism.  2. Mild enlargement of thoracic inlet nodes since 2012. Recommend oncology follow-up and repeat imaging(on the order of 3 months) to exclude early lymphoma recurrence.   Original Report Authenticated By: Tiburcio Pea    IMPRESSION: Principal Problem:   Unstable angina Active Problems:   Lymphoma of lymph  nodes of head, face, and/or neck   Breast cancer, stage 1, estrogen receptor negative   Hypothyroid   Chronic ITP (idiopathic thrombocytopenia)   Osteoporosis, unspecified   Palpitations   Dyslipidemia   RECOMMENDATION: Pt seen by Dr berry and myself. We will take on our service. Cath Monday. Add Nitrate, Heparin.  Time Spent Directly with Patient: 45 minutes  Abelino Derrick [phone] beeper 12/03/2012, 9:33 AM  Agree with note written by [facility]  +CRF and compelling story. Exam benign. Labs OK. EKG w/o acute changes. Enz neg. Plan cor angio Monday.    Runell Gess 12/04/2012 1:52 AM

## 2012-12-03 NOTE — ED Notes (Addendum)
Reddy, MD at bedside. 

## 2012-12-03 NOTE — Progress Notes (Signed)
ANTICOAGULATION CONSULT NOTE - Initial Consult  Pharmacy Consult for Heparin  Indication: chest pain/ACS  Allergies  Allergen Reactions  . Codeine Itching           . Doxycycline Swelling and Other (See Comments)    Burns throat also   . Meperidine And Related Itching and Rash    Makes cheeks red also  . Sulfa Antibiotics Itching   Patient Measurements: Height: 5\' 3"  (160 cm) Weight: 157 lb 11.2 oz (71.532 kg) IBW/kg (Calculated) : 52.4 Heparin Dosing Weight: 67.3  Vital Signs: Temp: 97.8 F (36.6 C) (08/02 0428) Temp src: Oral (08/02 0428) BP: 112/71 mmHg (08/02 0926) Pulse Rate: 80 (08/02 0926)   Recent Labs  12/02/12 1800 12/03/12 0145 12/03/12 0410 12/03/12 0858  HGB 12.6  --  12.1  --   HCT 36.4  --  36.7  --   PLT 232  --  205  --   CREATININE 0.56  --  0.56  --   TROPONINI  --  <0.30  --  <0.30    Estimated Creatinine Clearance: 56.7 ml/min (by C-G formula based on Cr of 0.56).  Medical History: Past Medical History  Diagnosis Date  . Thyroid disease   . Cancer of breast   . Lymphoma   . Breast cancer, stage 1, estrogen receptor negative 07/06/2011  . Lymphoma of lymph nodes of head, face, and/or neck 07/06/2011  . Hypothyroid 07/06/2011  . Chronic ITP (idiopathic thrombocytopenia) 07/06/2011    Medications:  Scheduled:  . aspirin  81 mg Oral Daily  . calcium-vitamin D  1 tablet Oral BID  . citalopram  10 mg Oral Daily  . levothyroxine  75 mcg Oral QAC breakfast  . metoprolol tartrate  25 mg Oral Daily  . multivitamin with minerals  1 tablet Oral Daily  . nitroGLYCERIN  0.5 inch Topical Q6H  . pantoprazole  40 mg Oral Daily  . simvastatin  10 mg Oral q1800  . sodium chloride  3 mL Intravenous Q12H  . [START ON 12/16/2012] Vitamin D (Ergocalciferol)  50,000 Units Oral Q14 Days    Assessment: 78 yo F with complaints of chest pain. PMH breast cancer, low-grade NHL, history of thrombocytopenia. Pharmacy asked to dose Heparin for ACS/NSTEMI/CP.   Hgb 12.1 plt 205 Scr 0.56 CrCl ~57  Goal of Therapy:  Heparin level 0.3-0.7 units/ml Monitor platelets by anticoagulation protocol: Yes   Plan:  -Initiate heparin 4000 unit bolus, followed by 800 unit/hr continuous infusion (12 units/kg/hr) -Check HL in 8 hours -Daily heparin level/CBC  Geraldene Eisel C. Tirso Laws, PharmD Clinical Pharmacist-Resident Pager: [phone] Pharmacy: [phone] 12/03/2012,10:19 AM

## 2012-12-03 NOTE — H&P (Addendum)
Patient's PCP: Thayer Headings, MD  Chief Complaint: Chest pain  History of Present Illness: Darlene [name] is a 78 y.o. Caucasian female with history of breast cancer, low-grade non-Hodgkin's lymphoma, hypothyroidism, arthritis, hyperlipidemia, GERD, history of thrombocytopenia, and osteoporosis who presents with complaints of chest pain.  Patient reports that her symptoms started at around 4 p.m. when she woke up from a nap.  She felt chest heaviness in her center chest with some radiating pain around her neck like something was wrapped around her neck.  10 minutes later, she still had persistent symptoms but improved in intensity.  Her husband asked her to come to the emergency department for further evaluation.  Denies any recent fevers, chills, nausea, vomiting, shortness of breath, abdominal pain, diarrhea, headaches or vision changes.  Hospitalist service was asked to admit the patient for further care and management.  Of note patient reports that she had her zoledronic acid by IV few days ago.  Review of Systems: All systems reviewed with the patient and positive as per history of present illness, otherwise all other systems are negative.  Past Medical History  Diagnosis Date  . Thyroid disease   . Cancer of breast   . Lymphoma   . Breast cancer, stage 1, estrogen receptor negative 07/06/2011  . Lymphoma of lymph nodes of head, face, and/or neck 07/06/2011  . Hypothyroid 07/06/2011  . Chronic ITP (idiopathic thrombocytopenia) 07/06/2011   Past Surgical History  Procedure Laterality Date  . Breast surgery    . Knee surgery     Family History  Problem Relation Age of Onset  . Hyperlipidemia Father   . Heart disease Father   . Heart disease Sister   . Cancer Brother   . Diabetes Son   . Heart disease Maternal Grandfather    History   Social History  . Marital Status: Married    Spouse Name: N/A    Number of Children: N/A  . Years of Education: N/A   Occupational History  .  Not on file.   Social History Main Topics  . Smoking status: Former Smoker -- 0.30 packs/day    Types: Cigarettes    Quit date: 09/29/1981  . Smokeless tobacco: Never Used  . Alcohol Use: Yes  . Drug Use: Not on file  . Sexually Active: Not on file   Other Topics Concern  . Not on file   Social History Narrative  . No narrative on file   Allergies: Codeine; Doxycycline; Meperidine and related; and Sulfa antibiotics  Home Meds: Prior to Admission medications   Medication Sig Start Date End Date Taking? Authorizing Provider  acetaminophen (TYLENOL) 500 MG tablet Take 1,000 mg by mouth 3 (three) times daily. For pain   Yes Historical Provider, MD  aspirin 81 MG chewable tablet Chew 81 mg by mouth daily.     Yes Historical Provider, MD  Calcium Carbonate-Vitamin D (CALCIUM 600+D) 600-200 MG-UNIT TABS Take 1 tablet by mouth 2 (two) times daily.    Yes Historical Provider, MD  citalopram (CELEXA) 10 MG tablet Take 10 mg by mouth daily.     Yes Historical Provider, MD  esomeprazole (NEXIUM) 40 MG capsule Take 40 mg by mouth daily with supper.    Yes Historical Provider, MD  famciclovir (FAMVIR) 250 MG tablet Take 500 mg by mouth 2 (two) times daily as needed (for herpes breakout).   Yes Historical Provider, MD  levothyroxine (SYNTHROID, LEVOTHROID) 75 MCG tablet Take 75 mcg by mouth daily.  Yes Historical Provider, MD  lovastatin (MEVACOR) 20 MG tablet Take 20 mg by mouth at bedtime.     Yes Historical Provider, MD  metoprolol tartrate (LOPRESSOR) 25 MG tablet Take 25 mg by mouth daily.    Yes Historical Provider, MD  Multiple Vitamins-Minerals (MULTIVITAMINS THER. W/MINERALS) TABS Take 1 tablet by mouth daily.     Yes Historical Provider, MD  Polyethyl Glycol-Propyl Glycol (SYSTANE) 0.4-0.3 % SOLN Apply 2 drops to eye 2 (two) times daily as needed (for dry eyes).   Yes Historical Provider, MD  Vitamin D, Ergocalciferol, (DRISDOL) 50000 UNITS CAPS Take 50,000 Units by mouth every 14  (fourteen) days.     Yes Historical Provider, MD  Zoledronic Acid (RECLAST IV) Inject 1 each into the vein once. Takes once yearly.  Last dose 12/01/12.   Yes Historical Provider, MD  amoxicillin-clavulanate (AUGMENTIN) 875-125 MG per tablet Take 1 tablet by mouth 2 (two) times daily. 11/19/12   Elvina Sidle, MD    Physical Exam: Blood pressure 174/80, pulse 62, temperature 97.7 F (36.5 C), temperature source Oral, resp. rate 18, height 5\' 3"  (1.6 m), SpO2 95.00%. General: Awake, Oriented x3, No acute distress. HEENT: EOMI, Moist mucous membranes Neck: Supple, cervical lymphadenopathy. CV: S1 and S2 Lungs: Clear to ascultation bilaterally Abdomen: Soft, Nontender, Nondistended, +bowel sounds. Ext: Good pulses. Trace edema. No clubbing or cyanosis noted. Neuro: Cranial Nerves II-XII grossly intact. Has 5/5 motor strength in upper and lower extremities.  Lab results:  Recent Labs  12/02/12 1800  NA 138  K 4.2  CL 105  CO2 22  GLUCOSE 101*  BUN 16  CREATININE 0.56  CALCIUM 10.2   No results found for this basename: AST, ALT, ALKPHOS, BILITOT, PROT, ALBUMIN,  in the last 72 hours No results found for this basename: LIPASE, AMYLASE,  in the last 72 hours  Recent Labs  12/02/12 1800  WBC 8.3  HGB 12.6  HCT 36.4  MCV 90.5  PLT 232   No results found for this basename: CKTOTAL, CKMB, CKMBINDEX, TROPONINI,  in the last 72 hours No components found with this basename: POCBNP,  No results found for this basename: DDIMER,  in the last 72 hours No results found for this basename: HGBA1C,  in the last 72 hours No results found for this basename: CHOL, HDL, LDLCALC, TRIG, CHOLHDL, LDLDIRECT,  in the last 72 hours No results found for this basename: TSH, T4TOTAL, FREET3, T3FREE, THYROIDAB,  in the last 72 hours No results found for this basename: VITAMINB12, FOLATE, FERRITIN, TIBC, IRON, RETICCTPCT,  in the last 72 hours Imaging results:  Dg Chest 2 View  12/02/2012   *RADIOLOGY  REPORT*  Clinical Data: Chest pain  CHEST - 2 VIEW  Comparison: 04/13/2011  Findings: The heart and pulmonary vascularity are within normal limits.  Postsurgical changes are again noted on the left.  The lungs are clear.  No sizable effusion is seen.  No acute bony abnormality is noted.  IMPRESSION: No acute abnormalities seen.   Original Report Authenticated By: Alcide Clever, M.D.   Ct Angio Chest Pe W/cm &/or Wo Cm  12/02/2012   *RADIOLOGY REPORT*  Clinical Data: Chest pain.  History of cancer.  CT ANGIOGRAPHY CHEST  Technique:  Multidetector CT imaging of the chest using the standard protocol during bolus administration of intravenous contrast. Multiplanar reconstructed images including MIPs were obtained and reviewed to evaluate the vascular anatomy.  Contrast: OMNIPAQUE IOHEXOL 350 MG/ML SOLN  Comparison: 04/13/2011.  Findings:  THORACIC INLET/BODY WALL:  Right axillary dissection.  No adenopathy in the in this region.  Left supraclavicular lymphadenopathy, 10 x 17 mm as compared to 7 x 11 mm previously.  Lower right IJ node has likewise enlarged, 13 x 10 mm as compared to 9 x 14 mm previously.  MEDIASTINUM:  Cardiomegaly.  No pericardial effusion.  Coronary artery atherosclerosis with of the most notable plaque along the proximal LAD.  On single planning imaing, thin peripheral high density around the ascending aorta is likely artifactual or chronic/atherosclerotic given the circumferential nature and similar appearance of the descending aorta.  No pulmonary embolism identified.  Mediastinal adenopathy.  Right retrocrural node stable at 10 mm diameter.  A lower right periesophageal node has minimally enlarged, currently 11 x 8 mm as compared to 9 x 7 mm previously. Periesophageal node in the mid chest stable 17 x 10 mm.  LUNG WINDOWS:  No consolidation.  No effusion.  No suspicious pulmonary nodule.  UPPER ABDOMEN:  No acute findings.  OSSEOUS:  No acute fracture.  No suspicious lytic or blastic  lesions.  IMPRESSION: 1. Negative for pulmonary embolism.  2. Mild enlargement of thoracic inlet nodes since 2012. Recommend oncology follow-up and repeat imaging(on the order of 3 months) to exclude early lymphoma recurrence.   Original Report Authenticated By: Tiburcio Pea   Other results: EKG: Normal sinus rhythm with heart rate 78.  Assessment & Plan by Problem: Chest pain Admit the patient to telemetry.  Cycle cardiac enzymes rule the patient out for acute coronary syndrome.  Continue aspirin and metoprolol.  Patient did not have any reproducible chest pain on palpation, discussed with Dr. Hiram Comber.  Uncertain if patient may be having chest pain related to recent infusion of zoledronic acid.  Due to her age, requested cardiology evaluation in the morning.  CT of the chest with contrast was negative for pulmonary embolism.  History of breast cancer/lymphoma Management per her oncologist, Dr. Cyndie Chime.  Stable.  CT of the chest with contrast showed mild enlargement of thoracic inlet nodes since 2012, to followup with her oncologist after discharge.  Hypothyroidism Continue Synthroid.  Hypertension Continue metoprolol.  Stable.  GERD Continue PPI.  Osteoporosis Recent administration of zoledronic acid.  Continue calcium with vitamin D.  Prophylaxis Lovenox.  CODE STATUS Full code.  Disposition Admit the patient to telemetry as observation.  Time spent on admission, talking to the patient, and coordinating care was: 50 mins.  Lacy Taglieri A, MD 12/03/2012, 1:19 AM

## 2012-12-03 NOTE — Progress Notes (Deleted)
ANTICOAGULATION CONSULT NOTE - Initial Consult  Pharmacy Consult for Lovenox Indication: chest pain/ACS  Allergies  Allergen Reactions  . Codeine Itching           . Doxycycline Swelling and Other (See Comments)    Burns throat also   . Meperidine And Related Itching and Rash    Makes cheeks red also  . Sulfa Antibiotics Itching    Patient Measurements: Height: 5\' 3"  (160 cm) Weight: 157 lb 11.2 oz (71.532 kg) IBW/kg (Calculated) : 52.4  Vital Signs: Temp: 97.8 [name] (36.6 C) (08/02 0428) Temp src: Oral (08/02 0428) BP: 112/71 mmHg (08/02 0926) Pulse Rate: 80 (08/02 0926)  Labs:  Recent Labs  12/02/12 1800 12/03/12 0145 12/03/12 0410  HGB 12.6  --  12.1  HCT 36.4  --  36.7  PLT 232  --  205  CREATININE 0.56  --  0.56  TROPONINI  --  <0.30  --     Estimated Creatinine Clearance: 56.7 ml/min (by C-G formula based on Cr of 0.56).   Medical History: Past Medical History  Diagnosis Date  . Thyroid disease   . Cancer of breast   . Lymphoma   . Breast cancer, stage 1, estrogen receptor negative 07/06/2011  . Lymphoma of lymph nodes of head, face, and/or neck 07/06/2011  . Hypothyroid 07/06/2011  . Chronic ITP (idiopathic thrombocytopenia) 07/06/2011    Medications:  Scheduled:  . aspirin  81 mg Oral Daily  . calcium-vitamin D  1 tablet Oral BID  . citalopram  10 mg Oral Daily  . enoxaparin (LOVENOX) injection  70 mg Subcutaneous Q12H  . levothyroxine  75 mcg Oral QAC breakfast  . metoprolol tartrate  25 mg Oral Daily  . multivitamin with minerals  1 tablet Oral Daily  . nitroGLYCERIN  0.5 inch Topical Q6H  . pantoprazole  40 mg Oral Daily  . simvastatin  10 mg Oral q1800  . sodium chloride  3 mL Intravenous Q12H  . [START ON 12/16/2012] Vitamin D (Ergocalciferol)  50,000 Units Oral Q14 Days    Assessment: 78 yo [name] with complaints of chest pain. PMH breast cancer, low-grade NHL, history of thrombocytopenia. Pharmacy asked to dose Lovenox for ACS/NSTEMI/CP.    Hgb 12.1   plt 205    Scr 0.56     CrCl ~57  Goal of Therapy:  0.6-1.2 units/ml HL Monitor platelets by anticoagulation protocol: Yes   Plan:  -Initiate lovenox 70 mg (1 mg/kg) SQ q 12h -Monitor CBC q 72 hours, SXS bleeding/complications  Naszir Cott C. Gurnie Duris, PharmD Clinical Pharmacist-Resident Pager: [phone] Pharmacy: [phone] 12/03/2012 9:41 AM

## 2012-12-04 DIAGNOSIS — I200 Unstable angina: Secondary | ICD-10-CM | POA: Diagnosis not present

## 2012-12-04 DIAGNOSIS — E785 Hyperlipidemia, unspecified: Secondary | ICD-10-CM

## 2012-12-04 LAB — CBC
HCT: 36.4 % (ref 36.0–46.0)
Hemoglobin: 12.1 g/dL (ref 12.0–15.0)
MCHC: 33.2 g/dL (ref 30.0–36.0)
WBC: 8.2 10*3/uL (ref 4.0–10.5)

## 2012-12-04 LAB — HEPARIN LEVEL (UNFRACTIONATED)
Heparin Unfractionated: 0.52 IU/mL (ref 0.30–0.70)
Heparin Unfractionated: 0.38 IU/mL (ref 0.30–0.70)

## 2012-12-04 NOTE — Progress Notes (Signed)
Subjective:  No CP/SOB  Objective:  Temp:  [97.4 F (36.3 C)-97.9 F (36.6 C)] 97.7 F (36.5 C) (08/03 0445) Pulse Rate:  [67-80] 67 (08/03 0445) Resp:  [16-18] 16 (08/03 0445) BP: (100-117)/(61-77) 112/61 mmHg (08/03 0445) SpO2:  [96 %-97 %] 97 % (08/03 0445) Weight change:   Intake/Output from previous day: 08/02 0701 - 08/03 0700 In: 360 [P.O.:360] Out: -   Intake/Output from this shift: Total I/O In: 240 [P.O.:240] Out: -   Physical Exam: General appearance: alert and no distress Neck: no adenopathy, no carotid bruit, no JVD, supple, symmetrical, trachea midline and thyroid not enlarged, symmetric, no tenderness/mass/nodules Lungs: clear to auscultation bilaterally Heart: regular rate and rhythm, S1, S2 normal, no murmur, click, rub or gallop Extremities: extremities normal, atraumatic, no cyanosis or edema  Lab Results: Results for orders placed during the hospital encounter of 12/02/12 (from the past 48 hour(s))  CBC     Status: None   Collection Time    12/02/12  6:00 PM      Result Value Range   WBC 8.3  4.0 - 10.5 K/uL   RBC 4.02  3.87 - 5.11 MIL/uL   Hemoglobin 12.6  12.0 - 15.0 g/dL   HCT 16.1  09.6 - 04.5 %   MCV 90.5  78.0 - 100.0 fL   MCH 31.3  26.0 - 34.0 pg   MCHC 34.6  30.0 - 36.0 g/dL   RDW 40.9  81.1 - 91.4 %   Platelets 232  150 - 400 K/uL  BASIC METABOLIC PANEL     Status: Abnormal   Collection Time    12/02/12  6:00 PM      Result Value Range   Sodium 138  135 - 145 mEq/L   Potassium 4.2  3.5 - 5.1 mEq/L   Chloride 105  96 - 112 mEq/L   CO2 22  19 - 32 mEq/L   Glucose, Bld 101 (*) 70 - 99 mg/dL   BUN 16  6 - 23 mg/dL   Creatinine, Ser 7.82  0.50 - 1.10 mg/dL   Calcium 95.6  8.4 - 21.3 mg/dL   GFR calc non Af Amer 88 (*) >90 mL/min   GFR calc Af Amer >90  >90 mL/min   Comment:            The eGFR has been calculated     using the CKD EPI equation.     This calculation has not been     validated in all clinical     situations.       eGFR's persistently     <90 mL/min signify     possible Chronic Kidney Disease.  POCT I-STAT TROPONIN I     Status: None   Collection Time    12/02/12  6:11 PM      Result Value Range   Troponin i, poc 0.00  0.00 - 0.08 ng/mL   Comment 3            Comment: Due to the release kinetics of cTnI,     a negative result within the first hours     of the onset of symptoms does not rule out     myocardial infarction with certainty.     If myocardial infarction is still suspected,     repeat the test at appropriate intervals.  TROPONIN I     Status: None   Collection Time    12/03/12  1:45 AM  Result Value Range   Troponin I <0.30  <0.30 ng/mL   Comment:            Due to the release kinetics of cTnI,     a negative result within the first hours     of the onset of symptoms does not rule out     myocardial infarction with certainty.     If myocardial infarction is still suspected,     repeat the test at appropriate intervals.  BASIC METABOLIC PANEL     Status: Abnormal   Collection Time    12/03/12  4:10 AM      Result Value Range   Sodium 140  135 - 145 mEq/L   Potassium 3.3 (*) 3.5 - 5.1 mEq/L   Chloride 105  96 - 112 mEq/L   CO2 24  19 - 32 mEq/L   Glucose, Bld 103 (*) 70 - 99 mg/dL   BUN 13  6 - 23 mg/dL   Creatinine, Ser 4.09  0.50 - 1.10 mg/dL   Calcium 9.8  8.4 - 81.1 mg/dL   GFR calc non Af Amer 88 (*) >90 mL/min   GFR calc Af Amer >90  >90 mL/min   Comment:            The eGFR has been calculated     using the CKD EPI equation.     This calculation has not been     validated in all clinical     situations.     eGFR's persistently     <90 mL/min signify     possible Chronic Kidney Disease.  CBC     Status: None   Collection Time    12/03/12  4:10 AM      Result Value Range   WBC 9.3  4.0 - 10.5 K/uL   RBC 4.04  3.87 - 5.11 MIL/uL   Hemoglobin 12.1  12.0 - 15.0 g/dL   HCT 91.4  78.2 - 95.6 %   MCV 90.8  78.0 - 100.0 fL   MCH 30.0  26.0 - 34.0 pg    MCHC 33.0  30.0 - 36.0 g/dL   RDW 21.3  08.6 - 57.8 %   Platelets 205  150 - 400 K/uL  TROPONIN I     Status: None   Collection Time    12/03/12  8:58 AM      Result Value Range   Troponin I <0.30  <0.30 ng/mL   Comment:            Due to the release kinetics of cTnI,     a negative result within the first hours     of the onset of symptoms does not rule out     myocardial infarction with certainty.     If myocardial infarction is still suspected,     repeat the test at appropriate intervals.  HEPARIN LEVEL (UNFRACTIONATED)     Status: Abnormal   Collection Time    12/03/12  6:14 PM      Result Value Range   Heparin Unfractionated 1.50 (*) 0.30 - 0.70 IU/mL   Comment:            IF HEPARIN RESULTS ARE BELOW     EXPECTED VALUES, AND PATIENT     DOSAGE HAS BEEN CONFIRMED,     SUGGEST FOLLOW UP TESTING     OF ANTITHROMBIN III LEVELS.  HEPARIN LEVEL (UNFRACTIONATED)     Status: Abnormal   Collection  Time    12/03/12  8:17 PM      Result Value Range   Heparin Unfractionated 0.15 (*) 0.30 - 0.70 IU/mL   Comment:            IF HEPARIN RESULTS ARE BELOW     EXPECTED VALUES, AND PATIENT     DOSAGE HAS BEEN CONFIRMED,     SUGGEST FOLLOW UP TESTING     OF ANTITHROMBIN III LEVELS.  CBC     Status: None   Collection Time    12/04/12  5:40 AM      Result Value Range   WBC 8.2  4.0 - 10.5 K/uL   RBC 3.99  3.87 - 5.11 MIL/uL   Hemoglobin 12.1  12.0 - 15.0 g/dL   HCT 16.1  09.6 - 04.5 %   MCV 91.2  78.0 - 100.0 fL   MCH 30.3  26.0 - 34.0 pg   MCHC 33.2  30.0 - 36.0 g/dL   RDW 40.9  81.1 - 91.4 %   Platelets 200  150 - 400 K/uL  HEPARIN LEVEL (UNFRACTIONATED)     Status: None   Collection Time    12/04/12  5:46 AM      Result Value Range   Heparin Unfractionated 0.52  0.30 - 0.70 IU/mL   Comment:            IF HEPARIN RESULTS ARE BELOW     EXPECTED VALUES, AND PATIENT     DOSAGE HAS BEEN CONFIRMED,     SUGGEST FOLLOW UP TESTING     OF ANTITHROMBIN III LEVELS.     Imaging: Imaging results have been reviewed  Assessment/Plan:   1. Principal Problem: 2.   Unstable angina 3. Active Problems: 4.   Breast cancer, stage 1, estrogen receptor negative 5.   Lymphoma of lymph nodes of head, face, and/or neck 6.   Hypothyroid 7.   Chronic ITP (idiopathic thrombocytopenia) 8.   Osteoporosis, unspecified 9.   Palpitations 10.   Dyslipidemia 11.   Time Spent Directly with Patient:  20 minutes  Length of Stay:  LOS: 2 days   No further CP. Exam benign. Labs remarkable for K 3.3. Will re check. For cath tomorrow.  Darlene [name] 12/04/2012, 9:58 AM

## 2012-12-04 NOTE — Progress Notes (Signed)
ANTICOAGULATION CONSULT NOTE - Follow Up Consult  Pharmacy Consult for heparin Indication: chest pain/ACS  Allergies  Allergen Reactions  . Codeine Itching           . Doxycycline Swelling and Other (See Comments)    Burns throat also   . Meperidine And Related Itching and Rash    Makes cheeks red also  . Sulfa Antibiotics Itching    Patient Measurements: Height: 5\' 3"  (160 cm) Weight: 157 lb 11.2 oz (71.532 kg) IBW/kg (Calculated) : 52.4 Heparin Dosing Weight: 67.3kg  Vital Signs: Temp: 97.7 F (36.5 C) (08/03 0445) BP: 112/61 mmHg (08/03 0445) Pulse Rate: 67 (08/03 0445)  Labs:  Recent Labs  12/02/12 1800 12/03/12 0145 12/03/12 0410 12/03/12 0858  12/03/12 2017 12/04/12 0540 12/04/12 0546 12/04/12 1223  HGB 12.6  --  12.1  --   --   --  12.1  --   --   HCT 36.4  --  36.7  --   --   --  36.4  --   --   PLT 232  --  205  --   --   --  200  --   --   HEPARINUNFRC  --   --   --   --   < > 0.15*  --  0.52 0.38  CREATININE 0.56  --  0.56  --   --   --   --   --   --   TROPONINI  --  <0.30  --  <0.30  --   --   --   --   --   < > = values in this interval not displayed.  Estimated Creatinine Clearance: 56.7 ml/min (by C-G formula based on Cr of 0.56).   Assessment: 78 YOF started on heparin for chest pain. Heparin level at goal.  No bleeding noted.   Goal of Therapy:  Heparin level 0.3-0.7 units/ml Monitor platelets by anticoagulation protocol: Yes   Plan:  Continue Heparin at 1000 units/hr. Daily heparin level/cbc  Wendie Simmer, PharmD, BCPS Clinical Pharmacist  Pager: [phone]  12/04/2012 12:58 PM

## 2012-12-04 NOTE — Progress Notes (Signed)
ANTICOAGULATION CONSULT NOTE - Follow Up Consult  Pharmacy Consult for heparin Indication: USAP  Labs:  Recent Labs  12/02/12 1800 12/03/12 0145 12/03/12 0410 12/03/12 0858 12/03/12 1814 12/03/12 2017 12/04/12 0540 12/04/12 0546  HGB 12.6  --  12.1  --   --   --  12.1  --   HCT 36.4  --  36.7  --   --   --  36.4  --   PLT 232  --  205  --   --   --  200  --   HEPARINUNFRC  --   --   --   --  1.50* 0.15*  --  0.52  CREATININE 0.56  --  0.56  --   --   --   --   --   TROPONINI  --  <0.30  --  <0.30  --   --   --   --     Assessment/Plan:  78yo female now therapeutic on heparin after rate increase.  Will continue gtt at current rate and confirm stable with additional level.  Vernard Gambles, PharmD, BCPS  12/04/2012,6:25 AM

## 2012-12-05 ENCOUNTER — Encounter (HOSPITAL_COMMUNITY)
Admission: EM | Disposition: A | Discharge status: Home / Self Care | Payer: Self-pay | Source: Home / Self Care | Attending: Cardiovascular Disease

## 2012-12-05 DIAGNOSIS — I200 Unstable angina: Secondary | ICD-10-CM | POA: Diagnosis not present

## 2012-12-05 HISTORY — PX: LEFT HEART CATHETERIZATION WITH CORONARY ANGIOGRAM: SHX5451

## 2012-12-05 LAB — BASIC METABOLIC PANEL
GFR calc Af Amer: >90 mL/min (ref >90)
GFR calc non Af Amer: >90 mL/min (ref >90)
Potassium: 4 mEq/L (ref 3.5–5.1)
Sodium: 139 mEq/L (ref 135–145)

## 2012-12-05 SURGERY — LEFT HEART CATHETERIZATION WITH CORONARY ANGIOGRAM
Anesthesia: LOCAL

## 2012-12-05 MED ORDER — HEPARIN (PORCINE) IN NACL 2-0.9 UNIT/ML-% IJ SOLN
INTRAMUSCULAR | Status: AC
  Filled 2012-12-05: qty 1000

## 2012-12-05 MED ORDER — FENTANYL CITRATE 0.05 MG/ML IJ SOLN
INTRAMUSCULAR | Status: AC
  Filled 2012-12-05: qty 2

## 2012-12-05 MED ORDER — SODIUM CHLORIDE 0.9 % IV SOLN
INTRAVENOUS | Status: DC
  Administered 2012-12-05: 10:00:00 via INTRAVENOUS

## 2012-12-05 MED ORDER — ONDANSETRON HCL 4 MG/2ML IJ SOLN: 4.0000 mg | Freq: Four times a day (QID) | INTRAMUSCULAR | Status: DC | PRN

## 2012-12-05 MED ORDER — ACETAMINOPHEN 325 MG PO TABS: 650.0000 mg | ORAL_TABLET | ORAL | Status: DC | PRN

## 2012-12-05 MED ORDER — SODIUM CHLORIDE 0.9 % IV SOLN
INTRAVENOUS | Status: DC
  Administered 2012-12-05: 20:00:00 via INTRAVENOUS

## 2012-12-05 MED ORDER — DIAZEPAM 5 MG PO TABS
5.0000 mg | ORAL_TABLET | ORAL | Status: AC
  Administered 2012-12-05: 5 mg via ORAL
  Filled 2012-12-05: qty 1

## 2012-12-05 MED ORDER — MIDAZOLAM HCL 2 MG/2ML IJ SOLN
INTRAMUSCULAR | Status: AC
  Filled 2012-12-05: qty 2

## 2012-12-05 MED ORDER — NITROGLYCERIN 0.2 MG/ML ON CALL CATH LAB
INTRAVENOUS | Status: AC
  Filled 2012-12-05: qty 1

## 2012-12-05 MED ORDER — LIDOCAINE HCL (PF) 1 % IJ SOLN
INTRAMUSCULAR | Status: AC
  Filled 2012-12-05: qty 30

## 2012-12-05 NOTE — CV Procedure (Signed)
Darlene [name] is a 78 y.o. female    [id number]  [id number] LOCATION:  FACILITY: [facility]  PHYSICIAN: Lennette Bihari, MD, [facility] [birth year]   DATE OF PROCEDURE:  12/05/2012    [facility]  CARDIAC CATHETERIZATION     HISTORY: Darlene [name] is a 78 year old female who has a history of breast cancer and non-Hodgkin's lymphoma. She has a history of hyperlipidemia and family history for coronary artery disease. She was admitted  on 12/03/2012 after she was awakened from sleep with severe chest pressure as if a "truck was sitting on my chest." She has subsequently ruled out for myocardial infarction. Due to worrisome symptoms, she presents now for definitive diagnostic cardiac catheterization.   PROCEDURE:  The patient was brought to the second floor [facility] cath lab in the postabsorptive state. She premedicated with Versed 2 mg and fentanyl 25 mcg intravenously. The right groin was prepped and shaved in usual sterile fashion. Xylocaine 1% was used for local anesthesia. A 5 French sheath was inserted into the right femoral artery. Diagnostic catheterizatiion was done with 5 [place] LF4, FR4, and pigtail catheters. Left ventriculography was done with 23 cc Omnipaque contrast. Hemostasis was obtained by direct manual compression. The patient tolerated the procedure well.   HEMODYNAMICS:   Central Aorta: 120/65   Left Ventricle: 120/8/16  ANGIOGRAPHY:  1. Left main: Normal  2. LAD: Normal 3. Left circumflex: Normal and gave rise to one marginal vessel. 4. Right coronary artery: Ostial calcification without luminal obstruction in a dominant right carotid artery  5.  Left ventriculography revealed normal LV contractility without focal segmental wall motion abnormalities. There is no evidence for mitral regurgitation.   IMPRESSION:  Normal LV function Essentially normal coronary arteries with mild ostial RCA calcification without  stenosis.   Lennette Bihari, MD, [facility] 12/05/2012 2:32 PM

## 2012-12-05 NOTE — Progress Notes (Signed)
ANTICOAGULATION CONSULT NOTE - Follow Up Consult  Pharmacy Consult for Heparin Indication: chest pain/ACS  Allergies  Allergen Reactions  . Codeine Itching           . Doxycycline Swelling and Other (See Comments)    Burns throat also   . Meperidine And Related Itching and Rash    Makes cheeks red also  . Sulfa Antibiotics Itching    Patient Measurements: Height: 5\' 3"  (160 cm) Weight: 157 lb 11.2 oz (71.532 kg) IBW/kg (Calculated) : 52.4 Heparin Dosing Weight: 67.3kg  Vital Signs: Temp: 98 F (36.7 C) (08/04 0455) BP: 113/59 mmHg (08/04 0455) Pulse Rate: 82 (08/04 0455)  Labs:  Recent Labs  12/02/12 1800 12/03/12 0145 12/03/12 0410 12/03/12 0858  12/04/12 0540 12/04/12 0546 12/04/12 1223 12/05/12 0500  HGB 12.6  --  12.1  --   --  12.1  --   --   --   HCT 36.4  --  36.7  --   --  36.4  --   --   --   PLT 232  --  205  --   --  200  --   --   --   HEPARINUNFRC  --   --   --   --   < >  --  0.52 0.38 0.43  CREATININE 0.56  --  0.56  --   --   --   --   --  0.50  TROPONINI  --  <0.30  --  <0.30  --   --   --   --   --   < > = values in this interval not displayed.  Estimated Creatinine Clearance: 56.7 ml/min (by C-G formula based on Cr of 0.5).   Medications:  Heparin 1000 units/hr  Assessment: Darlene [name] on heparin for CP/ACS. Heparin level (0.43) remains therapeutic - will continue current rate and follow-up post cath orders. - No CBC this AM - No significant bleeding reported  Goal of Therapy:  Heparin level 0.3-0.7 units/ml Monitor platelets by anticoagulation protocol: Yes   Plan:  1. Continue heparin drip 1000 units/hr (10 ml/hr) 2. Follow-up post cath orders  Cleon Dew [phone] 12/05/2012,10:45 AM

## 2012-12-05 NOTE — Progress Notes (Signed)
Subjective:  No angina, multiple vague complaints of various pains, a little anxious this am.  Objective:  Vital Signs in the last 24 hours: Temp:  [97.5 F (36.4 C)-98.3 F (36.8 C)] 98 F (36.7 C) (08/04 0455) Pulse Rate:  [65-82] 82 (08/04 0455) Resp:  [16-18] 16 (08/04 0455) BP: (113-131)/(59-76) 113/59 mmHg (08/04 0455) SpO2:  [95 %-96 %] 95 % (08/04 0455)  Intake/Output from previous day:  Intake/Output Summary (Last 24 hours) at 12/05/12 0949 Last data filed at 12/05/12 0700  Gross per 24 hour  Intake 460.17 ml  Output      0 ml  Net 460.17 ml    Physical Exam: General appearance: alert, cooperative and no distress Lungs: clear to auscultation bilaterally Heart: regular rate and rhythm  2+ pulses Abd: BS+ No edema   Rate: 82  Rhythm: normal sinus rhythm  Lab Results:  Recent Labs  12/03/12 0410 12/04/12 0540  WBC 9.3 8.2  HGB 12.1 12.1  PLT 205 200    Recent Labs  12/03/12 0410 12/05/12 0500  NA 140 139  K 3.3* 4.0  CL 105 105  CO2 24 24  GLUCOSE 103* 103*  BUN 13 14  CREATININE 0.56 0.50    Recent Labs  12/03/12 0145 12/03/12 0858  TROPONINI <0.30 <0.30    Imaging: Imaging results have been reviewed  Cardiac Studies:  Assessment/Plan:   Principal Problem:   Unstable angina Active Problems:   Lymphoma of lymph nodes of head, face, and/or neck   Breast cancer, stage 1, estrogen receptor negative   Hypothyroid   Chronic ITP (idiopathic thrombocytopenia)   Osteoporosis, unspecified   Palpitations   Dyslipidemia    PLAN: Cath today, various complaints this am but she did have worrisome symptoms on admission.   Corine Shelter PA-C [facility] [phone] 12/05/2012, 9:49 AM   Patient seen and examined. Agree with assessment and plan. Presenting symptoms were worrisome. Discussed cath and possible PCI with pt and family. Plan today. Answered all questions.   Lennette Bihari, MD, [facility] 12/05/2012 11:40 AM

## 2012-12-06 DIAGNOSIS — I200 Unstable angina: Secondary | ICD-10-CM | POA: Diagnosis not present

## 2012-12-06 LAB — CBC
HCT: 34 % — ABNORMAL LOW (ref 36.0–46.0)
Hemoglobin: 11.3 g/dL — ABNORMAL LOW (ref 12.0–15.0)
MCH: 30.1 pg (ref 26.0–34.0)
MCHC: 33.2 g/dL (ref 30.0–36.0)
RBC: 3.75 MIL/uL — ABNORMAL LOW (ref 3.87–5.11)

## 2012-12-06 NOTE — Discharge Summary (Signed)
Physician Discharge Summary  Patient ID: Darlene [name] MRN: [id number] DOB/AGE: 1935/02/28 78 y.o.  Admit date: 12/02/2012 Discharge date: 12/06/2012  Admission Diagnoses:  Unstable angina  Discharge Diagnoses:  Principal Problem:   Unstable angina Active Problems:   Breast cancer, stage 1, estrogen receptor negative   Lymphoma of lymph nodes of head, face, and/or neck   Hypothyroid   Chronic ITP (idiopathic thrombocytopenia)   Osteoporosis, unspecified   Palpitations   Dyslipidemia   Discharged Condition: stable  Hospital Course:  This is a 78 y.o. female with a past medical history significant for breast cancer and non Hodgkin's Lymphoma. She has no history of CAD or prior cardiac evaluation. She has treated dyslipidemia, and a family history of CAD. She was admitted with chest pain that awakened her from sleep. She describes SSCP-"pressure", "like a truck on my chest". EKG and Troponin are negative but she still has some chest discomfort.  She was scheduled for coronary angiogram which revealed normal coronary arteries with the exception of mild ostial RCA calcification with no stenosis and normal LV function.  Potassium was replaced.  The patient was seen by Dr. Allyson Sabal who felt she was stable for DC home.  Follow up was arranged.   Consults: None  Significant Diagnostic Studies:  Left heart cath.  HEMODYNAMICS:  Central Aorta: 120/65  Left Ventricle: 120/8/16  ANGIOGRAPHY:  1. Left main: Normal  2. LAD: Normal  3. Left circumflex: Normal and gave rise to one marginal vessel.  4. Right coronary artery: Ostial calcification without luminal obstruction in a dominant right carotid artery  5. Left ventriculography revealed normal LV contractility without focal segmental wall motion abnormalities. There is no evidence for mitral regurgitation.  IMPRESSION:  Normal LV function  Essentially normal coronary arteries with mild ostial RCA calcification without stenosis.  Lennette Bihari, MD, [facility]  12/05/2012  CBC    Component Value Date/Time   WBC 6.9 12/06/2012 0650   WBC 7.6 07/11/2012 0906   RBC 3.75* 12/06/2012 0650   RBC 4.30 07/11/2012 0906   HGB 11.3* 12/06/2012 0650   HGB 12.8 07/11/2012 0906   HCT 34.0* 12/06/2012 0650   HCT 38.2 07/11/2012 0906   PLT 207 12/06/2012 0650   PLT 201 07/11/2012 0906   MCV 90.7 12/06/2012 0650   MCV 88.8 07/11/2012 0906   MCH 30.1 12/06/2012 0650   MCH 29.8 07/11/2012 0906   MCHC 33.2 12/06/2012 0650   MCHC 33.5 07/11/2012 0906   RDW 13.5 12/06/2012 0650   RDW 13.1 07/11/2012 0906   LYMPHSABS 1.6 07/11/2012 0906   LYMPHSABS 2.7 04/13/2011 2101   MONOABS 0.4 07/11/2012 0906   MONOABS 0.4 04/13/2011 2101   EOSABS 0.2 07/11/2012 0906   EOSABS 0.2 04/13/2011 2101   BASOSABS 0.0 07/11/2012 0906   BASOSABS 0.0 04/13/2011 2101    BMET    Component Value Date/Time   NA 139 12/05/2012 0500   NA 139 07/11/2012 0906   K 4.0 12/05/2012 0500   K 3.9 07/11/2012 0906   CL 105 12/05/2012 0500   CL 106 07/11/2012 0906   CO2 24 12/05/2012 0500   CO2 24 07/11/2012 0906   GLUCOSE 103* 12/05/2012 0500   GLUCOSE 107* 07/11/2012 0906   BUN 14 12/05/2012 0500   BUN 15.5 07/11/2012 0906   CREATININE 0.50 12/05/2012 0500   CREATININE 0.7 07/11/2012 0906   CALCIUM 9.8 12/05/2012 0500   CALCIUM 10.4 07/11/2012 0906   GFRNONAA >90 12/05/2012 0500   GFRAA >90  12/05/2012 0500     Treatments: See above  Discharge Exam: Blood pressure 123/59, pulse 79, temperature 97.8 F (36.6 C), temperature source Oral, resp. rate 18, height 5\' 3"  (1.6 m), weight 164 lb 3.2 oz (74.481 kg), SpO2 98.00%.   Disposition: 01-Home or Self Care  Discharge Orders   Future Appointments Provider Department Dept Phone   12/26/2012 9:00 AM Runell Gess, MD [facility] [place] [phone]   01/11/2013 11:30 AM Mauri [facility] [phone]   07/11/2013 10:30 AM Krista Blue [facility] MED[facility] ON[facility]  [phone]   07/11/2013 11:00 AM Levert Feinstein, MD [facility] [phone]   Future Orders Complete By Expires     Diet - low sodium heart healthy  As directed     Discharge instructions  As directed     Comments:      No lifting more than a half gallon of milk or driving for three days.    Increase activity slowly  As directed         Medication List    STOP taking these medications       amoxicillin-clavulanate 875-125 MG per tablet  Commonly known as:  AUGMENTIN     aspirin 81 MG chewable tablet      TAKE these medications       acetaminophen 500 MG tablet  Commonly known as:  TYLENOL  Take 1,000 mg by mouth 3 (three) times daily. For pain     CALCIUM 600+D 600-200 MG-UNIT Tabs  Generic drug:  Calcium Carbonate-Vitamin D  Take 1 tablet by mouth 2 (two) times daily.     citalopram 10 MG tablet  Commonly known as:  CELEXA  Take 10 mg by mouth daily.     esomeprazole 40 MG capsule  Commonly known as:  NEXIUM  Take 40 mg by mouth daily with supper.     famciclovir 250 MG tablet  Commonly known as:  FAMVIR  Take 500 mg by mouth 2 (two) times daily as needed (for herpes breakout).     levothyroxine 75 MCG tablet  Commonly known as:  SYNTHROID, LEVOTHROID  Take 75 mcg by mouth daily.     lovastatin 20 MG tablet  Commonly known as:  MEVACOR  Take 20 mg by mouth at bedtime.     metoprolol tartrate 25 MG tablet  Commonly known as:  LOPRESSOR  Take 25 mg by mouth daily.     multivitamins ther. w/minerals Tabs tablet  Take 1 tablet by mouth daily.     RECLAST IV  Inject 1 each into the vein once. Takes once yearly.  Last dose 12/01/12.     SYSTANE 0.4-0.3 % Soln  Generic drug:  Polyethyl Glycol-Propyl Glycol  Apply 2 drops to eye 2 (two) times daily as needed (for dry eyes).     Vitamin D (Ergocalciferol) 50000 UNITS Caps capsule  Commonly known as:  DRISDOL  Take 50,000 Units by mouth every 14 (fourteen) days.            Follow-up Information   Follow up with Runell Gess, MD On 12/26/2012. (9:00 am)    Contact information:   [address] [place] [address] [phone]       Signed: Wilburt Finlay 12/06/2012, 11:37 AM

## 2012-12-06 NOTE — Progress Notes (Signed)
Subjective:  No CP/SOB  Objective:  Temp:  [97.8 F (36.6 C)-98 F (36.7 C)] 97.8 F (36.6 C) (08/05 0604) Pulse Rate:  [62-79] 79 (08/05 0604) Resp:  [16-18] 18 (08/05 0604) BP: (107-142)/(49-87) 123/59 mmHg (08/05 0604) SpO2:  [97 %-98 %] 98 % (08/05 0604) Weight:  [164 lb 3.2 oz (74.481 kg)] 164 lb 3.2 oz (74.481 kg) (08/05 0604) Weight change:   Intake/Output from previous day: 08/04 0701 - 08/05 0700 In: 1685.4 [P.O.:360; I.V.:1325.4] Out: -   Intake/Output from this shift:    Physical Exam: General appearance: alert and no distress Neck: no adenopathy, no carotid bruit, no JVD, supple, symmetrical, trachea midline and thyroid not enlarged, symmetric, no tenderness/mass/nodules Lungs: clear to auscultation bilaterally Heart: regular rate and rhythm, S1, S2 normal, no murmur, click, rub or gallop Extremities: Right groin OK  Lab Results: Results for orders placed during the hospital encounter of 12/02/12 (from the past 48 hour(s))  HEPARIN LEVEL (UNFRACTIONATED)     Status: None   Collection Time    12/04/12 12:23 PM      Result Value Range   Heparin Unfractionated 0.38  0.30 - 0.70 IU/mL   Comment:            IF HEPARIN RESULTS ARE BELOW     EXPECTED VALUES, AND PATIENT     DOSAGE HAS BEEN CONFIRMED,     SUGGEST FOLLOW UP TESTING     OF ANTITHROMBIN III LEVELS.  HEPARIN LEVEL (UNFRACTIONATED)     Status: None   Collection Time    12/05/12  5:00 AM      Result Value Range   Heparin Unfractionated 0.43  0.30 - 0.70 IU/mL   Comment:            IF HEPARIN RESULTS ARE BELOW     EXPECTED VALUES, AND PATIENT     DOSAGE HAS BEEN CONFIRMED,     SUGGEST FOLLOW UP TESTING     OF ANTITHROMBIN III LEVELS.  BASIC METABOLIC PANEL     Status: Abnormal   Collection Time    12/05/12  5:00 AM      Result Value Range   Sodium 139  135 - 145 mEq/L   Potassium 4.0  3.5 - 5.1 mEq/L   Chloride 105  96 - 112 mEq/L   CO2 24  19 - 32 mEq/L   Glucose, Bld 103 (*) 70 - 99  mg/dL   BUN 14  6 - 23 mg/dL   Creatinine, Ser 9.14  0.50 - 1.10 mg/dL   Calcium 9.8  8.4 - 78.2 mg/dL   GFR calc non Af Amer >90  >90 mL/min   GFR calc Af Amer >90  >90 mL/min   Comment:            The eGFR has been calculated     using the CKD EPI equation.     This calculation has not been     validated in all clinical     situations.     eGFR's persistently     <90 mL/min signify     possible Chronic Kidney Disease.  POCT ACTIVATED CLOTTING TIME     Status: None   Collection Time    12/05/12  2:22 PM      Result Value Range   Activated Clotting Time 114    CBC     Status: Abnormal   Collection Time    12/06/12  6:50 AM  Result Value Range   WBC 6.9  4.0 - 10.5 K/uL   Comment: WHITE COUNT CONFIRMED ON SMEAR   RBC 3.75 (*) 3.87 - 5.11 MIL/uL   Hemoglobin 11.3 (*) 12.0 - 15.0 g/dL   HCT 16.1 (*) 09.6 - 04.5 %   MCV 90.7  78.0 - 100.0 fL   MCH 30.1  26.0 - 34.0 pg   MCHC 33.2  30.0 - 36.0 g/dL   RDW 40.9  81.1 - 91.4 %   Platelets 207  150 - 400 K/uL    Imaging: Imaging results have been reviewed  Assessment/Plan:   1. Principal Problem: 2.   Unstable angina 3. Active Problems: 4.   Breast cancer, stage 1, estrogen receptor negative 5.   Lymphoma of lymph nodes of head, face, and/or neck 6.   Hypothyroid 7.   Chronic ITP (idiopathic thrombocytopenia) 8.   Osteoporosis, unspecified 9.   Palpitations 10.   Dyslipidemia 11.   Time Spent Directly with Patient:  20 minutes  Length of Stay:  LOS: 4 days   S/P cath revealing essentially nl cors/LV. Non cardiac CP. Exam benign. Labs OK. D/C home on anti reflux Rx. ROV with MLP 1-2 weeks, me in 6-8 weeks.   Darlene [name] 12/06/2012, 8:05 AM

## 2012-12-07 ENCOUNTER — Telehealth: Payer: Self-pay | Admitting: Cardiovascular Disease

## 2012-12-07 NOTE — Telephone Encounter (Signed)
Returned call.  Pt informed she can take the bandage off today.  Pt stated she didn't check before she let b/c they were so busy.  Stated she would have told them if she had thought about it before she left.  Pt advised to carefully remove tape and clean area w/ warm water/mild soap and keep open to air.  Pt advised to call back w/ any questions or concerns.  Pt will keep appt on 8.25.14 w/ Dr. Allyson Sabal for f/u.

## 2012-12-07 NOTE — Telephone Encounter (Signed)
Pt is calling in regards to her bandage on her leg from her procedure that she had on Monday. She wants to know if she can take off the bandage.

## 2012-12-08 ENCOUNTER — Other Ambulatory Visit: Payer: Self-pay | Admitting: Oncology

## 2012-12-08 DIAGNOSIS — M1710 Unilateral primary osteoarthritis, unspecified knee: Secondary | ICD-10-CM | POA: Diagnosis not present

## 2012-12-08 DIAGNOSIS — C50912 Malignant neoplasm of unspecified site of left female breast: Secondary | ICD-10-CM

## 2012-12-09 ENCOUNTER — Other Ambulatory Visit: Payer: Self-pay | Admitting: Oncology

## 2012-12-09 DIAGNOSIS — C50912 Malignant neoplasm of unspecified site of left female breast: Secondary | ICD-10-CM

## 2012-12-12 ENCOUNTER — Telehealth: Payer: Self-pay | Admitting: Oncology

## 2012-12-23 DIAGNOSIS — M1710 Unilateral primary osteoarthritis, unspecified knee: Secondary | ICD-10-CM | POA: Diagnosis not present

## 2012-12-26 ENCOUNTER — Encounter: Payer: Self-pay | Admitting: Cardiovascular Disease

## 2012-12-26 ENCOUNTER — Ambulatory Visit (INDEPENDENT_AMBULATORY_CARE_PROVIDER_SITE_OTHER): Payer: Medicare Other | Admitting: Cardiovascular Disease

## 2012-12-26 VITALS — BP 110/72 | HR 80 | Ht 63.0 in | Wt 160.0 lb

## 2012-12-26 DIAGNOSIS — E785 Hyperlipidemia, unspecified: Secondary | ICD-10-CM | POA: Diagnosis not present

## 2012-12-26 DIAGNOSIS — R079 Chest pain, unspecified: Secondary | ICD-10-CM | POA: Diagnosis not present

## 2012-12-26 NOTE — Patient Instructions (Addendum)
Follow up with Dr Berry as needed.  

## 2012-12-26 NOTE — Assessment & Plan Note (Signed)
The patient recently had a rule out MI admission from 812 12/06/2012. She will Dr. Marlowe Alt. Cardiac catheterization revealed minimal CAD with normal LV function.

## 2012-12-26 NOTE — Assessment & Plan Note (Signed)
On statin therapy followed by her PCP 

## 2012-12-26 NOTE — Progress Notes (Signed)
12/26/2012 Darlene [name]   03-06-1935  [id number]  Primary Physician Thayer Headings, MD Primary Cardiologist: Runell Gess MD Roseanne Reno  HPI:  This is a 78 y.o. female with a past medical history significant for breast cancer and non Hodgkin's Lymphoma. She has no history of CAD or prior cardiac evaluation. She has treated dyslipidemia, and a family history of CAD. She was admitted with chest pain that awakened her from sleep. She describes SSCP-"pressure", "like a truck on my chest". EKG and Troponin are negative but she still has some chest discomfort.she underwent diagnostic coronary arteriography with Dr. Daphene Jaeger done femorally revealing minimal CAD and normal LV function. She was discharged home in stable condition. She has had no recurrent symptoms.    Current Outpatient Prescriptions  Medication Sig Dispense Refill  . acetaminophen (TYLENOL) 500 MG tablet Take 1,000 mg by mouth 3 (three) times daily. For pain      . Calcium Carbonate-Vitamin D (CALCIUM 600+D) 600-200 MG-UNIT TABS Take 1 tablet by mouth 2 (two) times daily.       . citalopram (CELEXA) 10 MG tablet Take 10 mg by mouth daily.        Marland Kitchen esomeprazole (NEXIUM) 40 MG capsule Take 40 mg by mouth daily with supper.       . famciclovir (FAMVIR) 250 MG tablet Take 500 mg by mouth 2 (two) times daily as needed (for herpes breakout).      Marland Kitchen levothyroxine (SYNTHROID, LEVOTHROID) 75 MCG tablet Take 75 mcg by mouth daily.        Marland Kitchen lovastatin (MEVACOR) 20 MG tablet Take 20 mg by mouth at bedtime.        . metoprolol succinate (TOPROL-XL) 25 MG 24 hr tablet Take 25 mg by mouth daily.       . mometasone (ELOCON) 0.1 % cream       . Multiple Vitamins-Minerals (MULTIVITAMINS THER. W/MINERALS) TABS Take 1 tablet by mouth daily.        Bertram Gala Glycol-Propyl Glycol (SYSTANE) 0.4-0.3 % SOLN Apply 2 drops to eye 2 (two) times daily as needed (for dry eyes).      . Vitamin D, Ergocalciferol, (DRISDOL) 50000 UNITS CAPS  Take 50,000 Units by mouth every 14 (fourteen) days.        . Zoledronic Acid (RECLAST IV) Inject 1 each into the vein once. Takes once yearly.  Last dose 12/01/12.       No current facility-administered medications for this visit.    Allergies  Allergen Reactions  . Codeine Itching           . Doxycycline Swelling and Other (See Comments)    Burns throat also   . Meperidine And Related Itching and Rash    Makes cheeks red also  . Sulfa Antibiotics Itching    History   Social History  . Marital Status: Married    Spouse Name: N/A    Number of Children: N/A  . Years of Education: N/A   Occupational History  . Not on file.   Social History Main Topics  . Smoking status: Former Smoker -- 0.30 packs/day    Types: Cigarettes    Quit date: 09/29/1981  . Smokeless tobacco: Never Used  . Alcohol Use: Yes  . Drug Use: Not on file  . Sexual Activity: Not on file   Other Topics Concern  . Not on file   Social History Narrative  . No narrative on file     Review of  Systems: General: negative for chills, fever, night sweats or weight changes.  Cardiovascular: negative for chest pain, dyspnea on exertion, edema, orthopnea, palpitations, paroxysmal nocturnal dyspnea or shortness of breath Dermatological: negative for rash Respiratory: negative for cough or wheezing Urologic: negative for hematuria Abdominal: negative for nausea, vomiting, diarrhea, bright red blood per rectum, melena, or hematemesis Neurologic: negative for visual changes, syncope, or dizziness All other systems reviewed and are otherwise negative except as noted above.    Blood pressure 110/72, pulse 80, height 5\' 3"  (1.6 m), weight 160 lb (72.576 kg).  General appearance: alert and no distress Neck: no adenopathy, no carotid bruit, no JVD, supple, symmetrical, trachea midline and thyroid not enlarged, symmetric, no tenderness/mass/nodules Lungs: clear to auscultation bilaterally Heart: regular rate  and rhythm, S1, S2 normal, no murmur, click, rub or gallop Extremities: extremities normal, atraumatic, no cyanosis or edema and right femoral arterial puncture site was well-healed  EKG not performed to  ASSESSMENT AND PLAN:   Chest pain The patient recently had a rule out MI admission from 812 12/06/2012. She will Dr. Marlowe Alt. Cardiac catheterization revealed minimal CAD with normal LV function.  Dyslipidemia On statin therapy followed by her PCP      Runell Gess MD [facility], [facility] 12/26/2012 9:46 AM

## 2012-12-27 DIAGNOSIS — S335XXA Sprain of ligaments of lumbar spine, initial encounter: Secondary | ICD-10-CM | POA: Diagnosis not present

## 2012-12-27 DIAGNOSIS — M25519 Pain in unspecified shoulder: Secondary | ICD-10-CM | POA: Diagnosis not present

## 2012-12-27 DIAGNOSIS — M47817 Spondylosis without myelopathy or radiculopathy, lumbosacral region: Secondary | ICD-10-CM | POA: Diagnosis not present

## 2013-01-03 DIAGNOSIS — M25569 Pain in unspecified knee: Secondary | ICD-10-CM | POA: Diagnosis not present

## 2013-01-03 DIAGNOSIS — M1710 Unilateral primary osteoarthritis, unspecified knee: Secondary | ICD-10-CM | POA: Diagnosis not present

## 2013-01-03 DIAGNOSIS — S43429A Sprain of unspecified rotator cuff capsule, initial encounter: Secondary | ICD-10-CM | POA: Diagnosis not present

## 2013-01-09 DIAGNOSIS — M1710 Unilateral primary osteoarthritis, unspecified knee: Secondary | ICD-10-CM | POA: Diagnosis not present

## 2013-01-11 ENCOUNTER — Other Ambulatory Visit (HOSPITAL_BASED_OUTPATIENT_CLINIC_OR_DEPARTMENT_OTHER): Payer: Medicare Other | Admitting: Lab

## 2013-01-11 DIAGNOSIS — C50911 Malignant neoplasm of unspecified site of right female breast: Secondary | ICD-10-CM

## 2013-01-11 DIAGNOSIS — C8581 Other specified types of non-Hodgkin lymphoma, lymph nodes of head, face, and neck: Secondary | ICD-10-CM

## 2013-01-11 DIAGNOSIS — C8591 Non-Hodgkin lymphoma, unspecified, lymph nodes of head, face, and neck: Secondary | ICD-10-CM

## 2013-01-11 DIAGNOSIS — D693 Immune thrombocytopenic purpura: Secondary | ICD-10-CM

## 2013-01-11 LAB — CBC WITH DIFFERENTIAL/PLATELET
EOS%: 1.4 % (ref 0.0–7.0)
MCH: 30.9 pg (ref 25.1–34.0)
MCV: 91 fL (ref 79.5–101.0)
MONO%: 4.7 % (ref 0.0–14.0)
RBC: 3.94 10*6/uL (ref 3.70–5.45)
RDW: 13.4 % (ref 11.2–14.5)

## 2013-01-19 DIAGNOSIS — M1710 Unilateral primary osteoarthritis, unspecified knee: Secondary | ICD-10-CM | POA: Diagnosis not present

## 2013-01-31 DIAGNOSIS — M1710 Unilateral primary osteoarthritis, unspecified knee: Secondary | ICD-10-CM | POA: Diagnosis not present

## 2013-02-02 ENCOUNTER — Telehealth: Payer: Self-pay | Admitting: *Deleted

## 2013-02-02 NOTE — Telephone Encounter (Signed)
Received authorization to obtain personal health Information from [facility] & request for pre-operative clearance.  Dr. Cyndie Chime gave clearance from hemotology & this was faxed to [phone].

## 2013-02-03 DIAGNOSIS — L259 Unspecified contact dermatitis, unspecified cause: Secondary | ICD-10-CM | POA: Diagnosis not present

## 2013-02-03 DIAGNOSIS — L723 Sebaceous cyst: Secondary | ICD-10-CM | POA: Diagnosis not present

## 2013-02-03 DIAGNOSIS — L821 Other seborrheic keratosis: Secondary | ICD-10-CM | POA: Diagnosis not present

## 2013-02-03 DIAGNOSIS — Z85828 Personal history of other malignant neoplasm of skin: Secondary | ICD-10-CM | POA: Diagnosis not present

## 2013-02-08 DIAGNOSIS — Z01818 Encounter for other preprocedural examination: Secondary | ICD-10-CM | POA: Diagnosis not present

## 2013-02-08 DIAGNOSIS — E785 Hyperlipidemia, unspecified: Secondary | ICD-10-CM | POA: Diagnosis not present

## 2013-02-08 DIAGNOSIS — Z23 Encounter for immunization: Secondary | ICD-10-CM | POA: Diagnosis not present

## 2013-02-10 ENCOUNTER — Encounter: Payer: Self-pay | Admitting: Podiatrist

## 2013-02-10 ENCOUNTER — Ambulatory Visit (INDEPENDENT_AMBULATORY_CARE_PROVIDER_SITE_OTHER): Payer: 59 | Admitting: Podiatrist

## 2013-02-10 VITALS — BP 112/58 | HR 71 | Resp 16 | Ht 63.0 in | Wt 157.0 lb

## 2013-02-10 DIAGNOSIS — M79609 Pain in unspecified limb: Secondary | ICD-10-CM

## 2013-02-10 DIAGNOSIS — B351 Tinea unguium: Secondary | ICD-10-CM

## 2013-02-10 NOTE — Progress Notes (Signed)
HPI: Patient presents today for follow up of painful mycotic nails.  Patient relates pain with ambulation and in shoes. Past medical history, meds, and allergies reviewed.   Objective:  Neurovascular status unchanged with palpable pedal pulses and neurological sensation intact.  Patients nails are elongated, thickened, discolored, dystrophic with ingrown deformity present.   Assessment: painful mycotic nails 1-5 bilateral  Plan: Discussed treatment options and alternatives. Debrided nails without complication.  Return appointment recommended at routine intervals of 3 months.   Marlowe Aschoff, DPM

## 2013-02-10 NOTE — Patient Instructions (Signed)
Call if any questions arise.

## 2013-03-20 DIAGNOSIS — D3190 Benign neoplasm of unspecified part of unspecified eye: Secondary | ICD-10-CM | POA: Diagnosis not present

## 2013-03-20 DIAGNOSIS — E559 Vitamin D deficiency, unspecified: Secondary | ICD-10-CM | POA: Diagnosis not present

## 2013-03-20 DIAGNOSIS — E039 Hypothyroidism, unspecified: Secondary | ICD-10-CM | POA: Diagnosis not present

## 2013-04-10 ENCOUNTER — Other Ambulatory Visit (HOSPITAL_COMMUNITY): Payer: Medicare Other

## 2013-04-10 ENCOUNTER — Ambulatory Visit
Admission: RE | Admit: 2013-04-10 | Discharge: 2013-04-10 | Disposition: A | Discharge status: Home / Self Care | Payer: Medicare Other | Source: Ambulatory Visit | Attending: Oncology | Admitting: Oncology

## 2013-04-10 DIAGNOSIS — N63 Unspecified lump in unspecified breast: Secondary | ICD-10-CM | POA: Diagnosis not present

## 2013-04-10 DIAGNOSIS — C50912 Malignant neoplasm of unspecified site of left female breast: Secondary | ICD-10-CM

## 2013-04-17 ENCOUNTER — Other Ambulatory Visit: Payer: Medicare Other

## 2013-04-17 ENCOUNTER — Inpatient Hospital Stay (HOSPITAL_COMMUNITY): Admission: RE | Admit: 2013-04-17 | Payer: Medicare Other | Source: Ambulatory Visit | Admitting: Orthopedic Surgery

## 2013-04-17 ENCOUNTER — Encounter (HOSPITAL_COMMUNITY): Admission: RE | Payer: Self-pay | Source: Ambulatory Visit

## 2013-04-17 SURGERY — ARTHROPLASTY, KNEE, TOTAL
Anesthesia: General | Laterality: Right

## 2013-04-19 ENCOUNTER — Other Ambulatory Visit: Payer: Medicare Other

## 2013-04-22 ENCOUNTER — Other Ambulatory Visit: Payer: Self-pay | Admitting: Oncology

## 2013-04-22 DIAGNOSIS — C50912 Malignant neoplasm of unspecified site of left female breast: Secondary | ICD-10-CM

## 2013-04-24 ENCOUNTER — Telehealth: Payer: Self-pay | Admitting: Oncology

## 2013-04-25 DIAGNOSIS — E785 Hyperlipidemia, unspecified: Secondary | ICD-10-CM | POA: Diagnosis not present

## 2013-05-02 DIAGNOSIS — E039 Hypothyroidism, unspecified: Secondary | ICD-10-CM | POA: Diagnosis not present

## 2013-05-02 DIAGNOSIS — E785 Hyperlipidemia, unspecified: Secondary | ICD-10-CM | POA: Diagnosis not present

## 2013-05-02 DIAGNOSIS — M810 Age-related osteoporosis without current pathological fracture: Secondary | ICD-10-CM | POA: Diagnosis not present

## 2013-05-02 DIAGNOSIS — F411 Generalized anxiety disorder: Secondary | ICD-10-CM | POA: Diagnosis not present

## 2013-06-05 ENCOUNTER — Encounter (HOSPITAL_COMMUNITY): Payer: Self-pay | Admitting: Pharmacy Technician

## 2013-06-07 ENCOUNTER — Encounter: Payer: Self-pay | Admitting: Physician Assistant

## 2013-06-07 ENCOUNTER — Other Ambulatory Visit: Payer: Self-pay | Admitting: Physician Assistant

## 2013-06-07 DIAGNOSIS — M1710 Unilateral primary osteoarthritis, unspecified knee: Secondary | ICD-10-CM | POA: Diagnosis not present

## 2013-06-07 DIAGNOSIS — C50919 Malignant neoplasm of unspecified site of unspecified female breast: Secondary | ICD-10-CM

## 2013-06-07 DIAGNOSIS — E079 Disorder of thyroid, unspecified: Secondary | ICD-10-CM | POA: Insufficient documentation

## 2013-06-07 DIAGNOSIS — C8590 Non-Hodgkin lymphoma, unspecified, unspecified site: Secondary | ICD-10-CM

## 2013-06-07 DIAGNOSIS — M1711 Unilateral primary osteoarthritis, right knee: Secondary | ICD-10-CM

## 2013-06-07 DIAGNOSIS — E785 Hyperlipidemia, unspecified: Secondary | ICD-10-CM | POA: Insufficient documentation

## 2013-06-07 NOTE — H&P (Signed)
TOTAL KNEE ADMISSION H&P  Patient is being admitted for right total knee arthroplasty.  Subjective:  Chief Complaint:right knee pain.  HPI: Darlene [name], 79 y.o. female, has a history of pain and functional disability in the right knee due to arthritis and has failed non-surgical conservative treatments for greater than 12 weeks to includeNSAID's and/or analgesics, corticosteriod injections, viscosupplementation injections, flexibility and strengthening excercises, supervised PT with diminished ADL's post treatment, use of assistive devices and activity modification.  Onset of symptoms was gradual, starting 10 years ago with gradually worsening course since that time. The patient noted prior procedures on the knee to include  arthroscopy and menisectomy on the right knee(s).  Patient currently rates pain in the right knee(s) at 10 out of 10 with activity. Patient has night pain, worsening of pain with activity and weight bearing, pain that interferes with activities of daily living, crepitus and joint swelling.  Patient has evidence of subchondral sclerosis, joint subluxation and joint space narrowing by imaging studies.There is no active infection.  Patient Active Problem List   Diagnosis Date Noted  . Right knee DJD   . Hyperlipidemia   . Lymphoma   . Cancer of breast   . Thyroid disease   . Chest pain 12/26/2012  . Unstable angina 12/03/2012  . Osteoporosis, unspecified 12/03/2012  . Palpitations 12/03/2012  . Dyslipidemia 12/03/2012  . Breast cancer, stage 1, estrogen receptor negative 07/06/2011  . Lymphoma of lymph nodes of head, face, and/or neck 07/06/2011  . Hypothyroid 07/06/2011  . Chronic ITP (idiopathic thrombocytopenia) 07/06/2011   Past Medical History  Diagnosis Date  . Thyroid disease   . Cancer of breast   . Lymphoma   . Breast cancer, stage 1, estrogen receptor negative 07/06/2011  . Lymphoma of lymph nodes of head, face, and/or neck 07/06/2011  . Hypothyroid  07/06/2011  . Chronic ITP (idiopathic thrombocytopenia) 07/06/2011  . Hyperlipidemia   . Right knee DJD     Past Surgical History  Procedure Laterality Date  . Breast surgery    . Knee surgery       (Not in a hospital admission) Allergies  Allergen Reactions  . Codeine Itching           . Doxycycline Swelling and Other (See Comments)    Burns throat also   . Meperidine And Related Itching and Rash    Makes cheeks red also  . Sulfa Antibiotics Itching    Current Outpatient Prescriptions on File Prior to Visit  Medication Sig Dispense Refill  . aspirin EC 81 MG tablet Take 81 mg by mouth daily.      . Calcium Carbonate-Vitamin D (CALCIUM 600+D) 600-200 MG-UNIT TABS Take 1 tablet by mouth 2 (two) times daily.       . citalopram (CELEXA) 10 MG tablet Take 20 mg by mouth daily.       . Cyanocobalamin (VITAMIN B-12 PO) Take 1,000 mcg by mouth daily.      Marland Kitchen docusate sodium (COLACE) 100 MG capsule Take 100 mg by mouth 2 (two) times daily.      Marland Kitchen esomeprazole (NEXIUM) 40 MG capsule Take 40 mg by mouth daily with supper.       . famciclovir (FAMVIR) 125 MG tablet Take 250 mg by mouth 2 (two) times daily as needed. For 5 days as needed for outbreak      . levothyroxine (SYNTHROID, LEVOTHROID) 75 MCG tablet Take 75 mcg by mouth daily.        Marland Kitchen  lovastatin (MEVACOR) 20 MG tablet Take 20 mg by mouth at bedtime.        . metoprolol succinate (TOPROL-XL) 25 MG 24 hr tablet Take 25 mg by mouth daily.       . mometasone (ELOCON) 0.1 % cream Apply 1 application topically daily as needed (for itching).       . Multiple Vitamins-Minerals (MULTIVITAMINS THER. W/MINERALS) TABS Take 1 tablet by mouth daily.        Vladimir Faster Glycol-Propyl Glycol (SYSTANE) 0.4-0.3 % SOLN Apply 2 drops to eye 2 (two) times daily as needed (for dry eyes).      . traMADol (ULTRAM) 50 MG tablet Take 50 mg by mouth every 4 (four) hours as needed for moderate pain.       . Vitamin D, Ergocalciferol, (DRISDOL) 50000 UNITS CAPS  Take 50,000 Units by mouth every 14 (fourteen) days.         No current facility-administered medications on file prior to visit.   History  Substance Use Topics  . Smoking status: Former Smoker -- 0.30 packs/day    Types: Cigarettes    Quit date: 09/29/1981  . Smokeless tobacco: Never Used  . Alcohol Use: 0.5 oz/week    1 drink(s) per week     Comment: glass for supper/lunch    Family History  Problem Relation Age of Onset  . Hyperlipidemia Father   . Heart disease Father   . Heart disease Sister   . Cancer Brother   . Diabetes Son   . Heart disease Maternal Grandfather      Review of Systems  Constitutional: Negative.   HENT: Negative.   Eyes: Negative.   Cardiovascular: Negative.   Gastrointestinal: Negative.   Genitourinary: Negative.   Musculoskeletal: Positive for back pain and joint pain.  Skin: Negative.   Neurological: Negative.   Endo/Heme/Allergies: Negative.   Psychiatric/Behavioral: Negative.     Objective:  Physical Exam  Constitutional: She is oriented to person, place, and time. She appears well-developed and well-nourished.  HENT:  Head: Normocephalic and atraumatic.  Eyes: Conjunctivae and EOM are normal. Pupils are equal, round, and reactive to light.  Neck: Normal range of motion. Neck supple.  Cardiovascular: Normal rate, regular rhythm and normal heart sounds.   Respiratory: Effort normal and breath sounds normal.  GI: Soft. Bowel sounds are normal.  Genitourinary:  Not pertinent to current symptomatology therefore not examined.  Musculoskeletal:  Examination of her right knee reveals pain medially and laterally, 1+ crepitation 1+ synovitis moderate varus deformity, range of motion -5 to 125 degrees with diffuse pain, knee is stable with normal patella tracking. Exam of her left knee reveals mild pain full range of motion knee is stable with normal patella tracking. Vascular exam: pulses 2+ and symmetric.  Neurological: She is alert and  oriented to person, place, and time.  Skin: Skin is warm and dry.  Psychiatric: She has a normal mood and affect. Her behavior is normal.    Vital signs in last 24 hours: Last recorded: 02/04 1300   BP: 117/68 Pulse: 80  Temp: 97.7 F (36.5 C)    Height: 5\' 1"  (1.549 m) SpO2: 96  Weight: 66.225 kg (146 lb)     Labs:   Estimated body mass index is 27.6 kg/(m^2) as calculated from the following:   Height as of this encounter: 5\' 1"  (1.549 m).   Weight as of this encounter: 66.225 kg (146 lb).   Imaging Review Plain radiographs demonstrate severe degenerative joint  disease of the right knee(s). The overall alignment issignificant varus. The bone quality appears to be good for age and reported activity level.  Assessment/Plan:  End stage arthritis, right knee   The patient history, physical examination, clinical judgment of the provider and imaging studies are consistent with end stage degenerative joint disease of the right knee(s) and total knee arthroplasty is deemed medically necessary. The treatment options including medical management, injection therapy arthroscopy and arthroplasty were discussed at length. The risks and benefits of total knee arthroplasty were presented and reviewed. The risks due to aseptic loosening, infection, stiffness, patella tracking problems, thromboembolic complications and other imponderables were discussed. The patient acknowledged the explanation, agreed to proceed with the plan and consent was signed. Patient is being admitted for inpatient treatment for surgery, pain control, PT, OT, prophylactic antibiotics, VTE prophylaxis, progressive ambulation and ADL's and discharge planning. The patient is planning to be discharged home with home health services  Elyn Krogh A. Kaleen Mask Physician Assistant [facility] [phone]  06/07/2013, 2:51 PM

## 2013-06-09 ENCOUNTER — Encounter (HOSPITAL_COMMUNITY): Payer: Self-pay

## 2013-06-09 ENCOUNTER — Encounter (HOSPITAL_COMMUNITY)
Admission: RE | Admit: 2013-06-09 | Discharge: 2013-06-09 | Disposition: A | Discharge status: Home / Self Care | Payer: Medicare Other | Source: Ambulatory Visit | Attending: Orthopedic Surgery | Admitting: Orthopedic Surgery

## 2013-06-09 DIAGNOSIS — Z01812 Encounter for preprocedural laboratory examination: Secondary | ICD-10-CM | POA: Insufficient documentation

## 2013-06-09 HISTORY — DX: Constipation, unspecified: K59.00

## 2013-06-09 HISTORY — DX: Effusion, unspecified joint: M25.40

## 2013-06-09 HISTORY — DX: Personal history of other specified conditions: Z87.898

## 2013-06-09 HISTORY — DX: Age-related osteoporosis without current pathological fracture: M81.0

## 2013-06-09 HISTORY — DX: Gastro-esophageal reflux disease without esophagitis: K21.9

## 2013-06-09 HISTORY — DX: Other specified disorders of bone density and structure, unspecified site: M85.80

## 2013-06-09 HISTORY — DX: Pain in unspecified joint: M25.50

## 2013-06-09 HISTORY — DX: Nocturia: R35.1

## 2013-06-09 HISTORY — DX: Personal history of colonic polyps: Z86.010

## 2013-06-09 HISTORY — DX: Personal history of colon polyps, unspecified: Z86.0100

## 2013-06-09 HISTORY — DX: Personal history of other mental and behavioral disorders: Z86.59

## 2013-06-09 HISTORY — DX: Chronic obstructive pulmonary disease, unspecified: J44.9

## 2013-06-09 LAB — COMPREHENSIVE METABOLIC PANEL
ALT: 16 U/L (ref 0–35)
AST: 13 U/L (ref 0–37)
Albumin: 3.7 g/dL (ref 3.5–5.2)
Alkaline Phosphatase: 59 U/L (ref 39–117)
BILIRUBIN TOTAL: 0.5 mg/dL (ref 0.3–1.2)
BUN: 9 mg/dL (ref 6–23)
CHLORIDE: 101 meq/L (ref 96–112)
CO2: 21 mEq/L (ref 19–32)
CREATININE: 0.48 mg/dL — AB (ref 0.50–1.10)
Calcium: 9.7 mg/dL (ref 8.4–10.5)
GFR calc non Af Amer: >90 mL/min (ref >90)
GLUCOSE: 119 mg/dL — AB (ref 70–99)
Potassium: 3.9 mEq/L (ref 3.7–5.3)
Sodium: 138 mEq/L (ref 137–147)
Total Protein: 7.1 g/dL (ref 6.0–8.3)

## 2013-06-09 LAB — TYPE AND SCREEN
ABO/RH(D): A POS
Antibody Screen: NEGATIVE

## 2013-06-09 LAB — APTT: aPTT: 26 seconds (ref 24–37)

## 2013-06-09 LAB — URINALYSIS, ROUTINE W REFLEX MICROSCOPIC
BILIRUBIN URINE: NEGATIVE
GLUCOSE, UA: NEGATIVE mg/dL
Ketones, ur: NEGATIVE mg/dL
Leukocytes, UA: NEGATIVE
NITRITE: NEGATIVE
PH: 6.5 (ref 5.0–8.0)
Protein, ur: NEGATIVE mg/dL
SPECIFIC GRAVITY, URINE: 1.006 (ref 1.005–1.030)
Urobilinogen, UA: 0.2 mg/dL (ref 0.0–1.0)

## 2013-06-09 LAB — CBC WITH DIFFERENTIAL/PLATELET
Basophils Absolute: 0 10*3/uL (ref 0.0–0.1)
Basophils Relative: 0 % (ref 0–1)
EOS ABS: 0.1 10*3/uL (ref 0.0–0.7)
Eosinophils Relative: 1 % (ref 0–5)
HEMATOCRIT: 38.4 % (ref 36.0–46.0)
HEMOGLOBIN: 12.7 g/dL (ref 12.0–15.0)
LYMPHS ABS: 1.5 10*3/uL (ref 0.7–4.0)
Lymphocytes Relative: 20 % (ref 12–46)
MCH: 28.9 pg (ref 26.0–34.0)
MCHC: 33.1 g/dL (ref 30.0–36.0)
MCV: 87.3 fL (ref 78.0–100.0)
MONO ABS: 0.6 10*3/uL (ref 0.1–1.0)
MONOS PCT: 7 % (ref 3–12)
NEUTROS PCT: 71 % (ref 43–77)
Neutro Abs: 5.4 10*3/uL (ref 1.7–7.7)
Platelets: 208 10*3/uL (ref 150–400)
RBC: 4.4 MIL/uL (ref 3.87–5.11)
RDW: 13.5 % (ref 11.5–15.5)
WBC: 7.6 10*3/uL (ref 4.0–10.5)

## 2013-06-09 LAB — URINE MICROSCOPIC-ADD ON

## 2013-06-09 LAB — SURGICAL PCR SCREEN
MRSA, PCR: NEGATIVE
STAPHYLOCOCCUS AUREUS: NEGATIVE

## 2013-06-09 LAB — PROTIME-INR
INR: 0.92 (ref 0.00–1.49)
PROTHROMBIN TIME: 12.2 s (ref 11.6–15.2)

## 2013-06-09 LAB — ABO/RH: ABO/RH(D): A POS

## 2013-06-09 MED ORDER — POVIDONE-IODINE 7.5 % EX SOLN: Freq: Once | CUTANEOUS | Status: DC

## 2013-06-09 MED ORDER — CHLORHEXIDINE GLUCONATE CLOTH 2 % EX PADS: 6.0000 | MEDICATED_PAD | Freq: Once | CUTANEOUS | Status: DC

## 2013-06-09 MED ORDER — CHLORHEXIDINE GLUCONATE 4 % EX LIQD: 60.0000 mL | Freq: Once | CUTANEOUS | Status: DC

## 2013-06-09 NOTE — Pre-Procedure Instructions (Signed)
Darlene [name]  06/09/2013   Your procedure is scheduled on:  Mon, Feb 16 @ 9:00 AM  Report to [facility] Stay Entrance A  at 7:00 AM.  Call this number if you have problems the morning of surgery: [phone]   Remember:   Do not eat food or drink liquids after midnight.   Take these medicines the morning of surgery with A SIP OF WATER: Celexa(Citalopram),Synthroid(Levothyroxine),Metoprolol(Toprol),and Tramadol(Ultram)               Stop taking your Aspirin. No Goody's,BC's,Aleve,Ibuprofen,Fish Oil,or any Herbal Medications   Do not wear jewelry, make-up or nail polish.  Do not wear lotions, powders, or perfumes. You may wear deodorant.  Do not shave 48 hours prior to surgery.   Do not bring valuables to the hospital.  [facility] is not responsible                  for any belongings or valuables.               Contacts, dentures or bridgework may not be worn into surgery.  Leave suitcase in the car. After surgery it may be brought to your room.  For patients admitted to the hospital, discharge time is determined by your                treatment team.                Special Instructions:  [facility] - Preparing for Surgery  Before surgery, you can play an important role.  Because skin is not sterile, your skin needs to be as free of germs as possible.  You can reduce the number of germs on you skin by washing with CHG (chlorahexidine gluconate) soap before surgery.  CHG is an antiseptic cleaner which kills germs and bonds with the skin to continue killing germs even after washing.  Please DO NOT use if you have an allergy to CHG or antibacterial soaps.  If your skin becomes reddened/irritated stop using the CHG and inform your nurse when you arrive at Short Stay.  Do not shave (including legs and underarms) for at least 48 hours prior to the first CHG shower.  You may shave your face.  Please follow these instructions carefully:   1.  Shower with CHG Soap the night  before surgery and the                                morning of Surgery.  2.  If you choose to wash your hair, wash your hair first as usual with your       normal shampoo.  3.  After you shampoo, rinse your hair and body thoroughly to remove the                      Shampoo.  4.  Use CHG as you would any other liquid soap.  You can apply chg directly       to the skin and wash gently with scrungie or a clean washcloth.  5.  Apply the CHG Soap to your body ONLY FROM THE NECK DOWN.        Do not use on open wounds or open sores.  Avoid contact with your eyes,       ears, mouth and genitals (private parts).  Wash genitals (private parts)  with your normal soap.  6.  Wash thoroughly, paying special attention to the area where your surgery        will be performed.  7.  Thoroughly rinse your body with warm water from the neck down.  8.  DO NOT shower/wash with your normal soap after using and rinsing off       the CHG Soap.  9.  Pat yourself dry with a clean towel.            10.  Wear clean pajamas.            11.  Place clean sheets on your bed the night of your first shower and do not        sleep with pets.  Day of Surgery  Do not apply any lotions/deoderants the morning of surgery.  Please wear clean clothes to the hospital/surgery center.     Please read over the following fact sheets that you were given: Pain Booklet, Coughing and Deep Breathing, Blood Transfusion Information, MRSA Information and Surgical Site Infection Prevention

## 2013-06-09 NOTE — Progress Notes (Addendum)
Saw Dr. Gwenlyn Found in Aug 2014 d/t having heaviness in chest so heart cath was ordered  Echo/Stress test done 20+yrs ago   EKG and CXR in epic from 12-02-12  Heart cath in epic from 2014  [place] is Dr. Thressa Sheller

## 2013-06-10 LAB — URINE CULTURE: Colony Count: 4000

## 2013-06-13 DIAGNOSIS — M79609 Pain in unspecified limb: Secondary | ICD-10-CM | POA: Diagnosis not present

## 2013-06-16 ENCOUNTER — Ambulatory Visit: Payer: Medicare Other | Admitting: Podiatrist

## 2013-06-16 DIAGNOSIS — H5200 Hypermetropia, unspecified eye: Secondary | ICD-10-CM | POA: Diagnosis not present

## 2013-06-16 DIAGNOSIS — H2510 Age-related nuclear cataract, unspecified eye: Secondary | ICD-10-CM | POA: Diagnosis not present

## 2013-06-18 MED ORDER — CEFAZOLIN SODIUM-DEXTROSE 2-3 GM-% IV SOLR
2.0000 g | INTRAVENOUS | Status: AC
  Administered 2013-06-19: 2 g via INTRAVENOUS

## 2013-06-18 MED ORDER — LACTATED RINGERS IV SOLN
INTRAVENOUS | Status: DC
  Administered 2013-06-19: 08:00:00 via INTRAVENOUS

## 2013-06-19 ENCOUNTER — Inpatient Hospital Stay (HOSPITAL_COMMUNITY)
Admission: RE | Admit: 2013-06-19 | Discharge: 2013-06-20 | DRG: 470 | Disposition: A | Discharge status: Home Health | Payer: Medicare Other | Source: Ambulatory Visit | Attending: Orthopedic Surgery | Admitting: Orthopedic Surgery

## 2013-06-19 ENCOUNTER — Encounter (HOSPITAL_COMMUNITY): Payer: Medicare Other | Admitting: Certified Registered Nurse Anesthetist

## 2013-06-19 ENCOUNTER — Inpatient Hospital Stay (HOSPITAL_COMMUNITY): Payer: Medicare Other | Admitting: Certified Registered Nurse Anesthetist

## 2013-06-19 ENCOUNTER — Encounter (HOSPITAL_COMMUNITY)
Admission: RE | Disposition: A | Discharge status: Home Health | Payer: Self-pay | Source: Ambulatory Visit | Attending: Orthopedic Surgery

## 2013-06-19 ENCOUNTER — Encounter (HOSPITAL_COMMUNITY): Payer: Self-pay | Admitting: Certified Registered Nurse Anesthetist

## 2013-06-19 DIAGNOSIS — Z833 Family history of diabetes mellitus: Secondary | ICD-10-CM | POA: Diagnosis not present

## 2013-06-19 DIAGNOSIS — K219 Gastro-esophageal reflux disease without esophagitis: Secondary | ICD-10-CM | POA: Diagnosis present

## 2013-06-19 DIAGNOSIS — Z7901 Long term (current) use of anticoagulants: Secondary | ICD-10-CM | POA: Diagnosis not present

## 2013-06-19 DIAGNOSIS — J449 Chronic obstructive pulmonary disease, unspecified: Secondary | ICD-10-CM | POA: Diagnosis present

## 2013-06-19 DIAGNOSIS — J4489 Other specified chronic obstructive pulmonary disease: Secondary | ICD-10-CM | POA: Diagnosis present

## 2013-06-19 DIAGNOSIS — E039 Hypothyroidism, unspecified: Secondary | ICD-10-CM | POA: Diagnosis present

## 2013-06-19 DIAGNOSIS — M810 Age-related osteoporosis without current pathological fracture: Secondary | ICD-10-CM | POA: Diagnosis present

## 2013-06-19 DIAGNOSIS — G8918 Other acute postprocedural pain: Secondary | ICD-10-CM | POA: Diagnosis not present

## 2013-06-19 DIAGNOSIS — Z882 Allergy status to sulfonamides status: Secondary | ICD-10-CM

## 2013-06-19 DIAGNOSIS — Z860100 Personal history of colon polyps, unspecified: Secondary | ICD-10-CM

## 2013-06-19 DIAGNOSIS — I200 Unstable angina: Secondary | ICD-10-CM | POA: Diagnosis present

## 2013-06-19 DIAGNOSIS — M179 Osteoarthritis of knee, unspecified: Secondary | ICD-10-CM | POA: Diagnosis present

## 2013-06-19 DIAGNOSIS — C8581 Other specified types of non-Hodgkin lymphoma, lymph nodes of head, face, and neck: Secondary | ICD-10-CM | POA: Diagnosis present

## 2013-06-19 DIAGNOSIS — E785 Hyperlipidemia, unspecified: Secondary | ICD-10-CM | POA: Diagnosis present

## 2013-06-19 DIAGNOSIS — Z853 Personal history of malignant neoplasm of breast: Secondary | ICD-10-CM | POA: Diagnosis not present

## 2013-06-19 DIAGNOSIS — D693 Immune thrombocytopenic purpura: Secondary | ICD-10-CM | POA: Diagnosis present

## 2013-06-19 DIAGNOSIS — Z87891 Personal history of nicotine dependence: Secondary | ICD-10-CM | POA: Diagnosis not present

## 2013-06-19 DIAGNOSIS — Z888 Allergy status to other drugs, medicaments and biological substances status: Secondary | ICD-10-CM | POA: Diagnosis not present

## 2013-06-19 DIAGNOSIS — M1990 Unspecified osteoarthritis, unspecified site: Secondary | ICD-10-CM | POA: Diagnosis not present

## 2013-06-19 DIAGNOSIS — IMO0002 Reserved for concepts with insufficient information to code with codable children: Secondary | ICD-10-CM | POA: Diagnosis not present

## 2013-06-19 DIAGNOSIS — M1710 Unilateral primary osteoarthritis, unspecified knee: Principal | ICD-10-CM | POA: Diagnosis present

## 2013-06-19 DIAGNOSIS — M25569 Pain in unspecified knee: Secondary | ICD-10-CM | POA: Diagnosis not present

## 2013-06-19 DIAGNOSIS — Z79899 Other long term (current) drug therapy: Secondary | ICD-10-CM

## 2013-06-19 DIAGNOSIS — Z881 Allergy status to other antibiotic agents status: Secondary | ICD-10-CM

## 2013-06-19 DIAGNOSIS — Z8249 Family history of ischemic heart disease and other diseases of the circulatory system: Secondary | ICD-10-CM

## 2013-06-19 DIAGNOSIS — M1711 Unilateral primary osteoarthritis, right knee: Secondary | ICD-10-CM

## 2013-06-19 DIAGNOSIS — Z8572 Personal history of non-Hodgkin lymphomas: Secondary | ICD-10-CM | POA: Diagnosis present

## 2013-06-19 HISTORY — PX: TOTAL KNEE ARTHROPLASTY: SHX125

## 2013-06-19 SURGERY — ARTHROPLASTY, KNEE, TOTAL
Anesthesia: Regional | Site: Knee | Laterality: Right

## 2013-06-19 MED ORDER — 0.9 % SODIUM CHLORIDE (POUR BTL) OPTIME
TOPICAL | Status: DC | PRN
  Administered 2013-06-19: 1000 mL

## 2013-06-19 MED ORDER — ALUM & MAG HYDROXIDE-SIMETH 200-200-20 MG/5ML PO SUSP
30.0000 mL | ORAL | Status: DC | PRN
Start: 2013-06-19 — End: 2013-06-20

## 2013-06-19 MED ORDER — METOPROLOL SUCCINATE ER 25 MG PO TB24
25.0000 mg | ORAL_TABLET | Freq: Every day | ORAL | Status: DC
  Administered 2013-06-20: 25 mg via ORAL
  Filled 2013-06-19: qty 1

## 2013-06-19 MED ORDER — ONDANSETRON HCL 4 MG PO TABS: 4.0000 mg | ORAL_TABLET | Freq: Four times a day (QID) | ORAL | Status: DC | PRN

## 2013-06-19 MED ORDER — CALCIUM CARBONATE-VITAMIN D 600-200 MG-UNIT PO TABS: 1.0000 | ORAL_TABLET | Freq: Two times a day (BID) | ORAL | Status: DC

## 2013-06-19 MED ORDER — PROMETHAZINE HCL 25 MG/ML IJ SOLN: 6.2500 mg | INTRAMUSCULAR | Status: DC | PRN

## 2013-06-19 MED ORDER — ACETAMINOPHEN 650 MG RE SUPP: 650.0000 mg | Freq: Four times a day (QID) | RECTAL | Status: DC | PRN

## 2013-06-19 MED ORDER — VITAMIN B-12 1000 MCG PO TABS
1000.0000 ug | ORAL_TABLET | Freq: Every day | ORAL | Status: DC
  Administered 2013-06-20: 1000 ug via ORAL
  Filled 2013-06-19: qty 1

## 2013-06-19 MED ORDER — SIMVASTATIN 20 MG PO TABS
20.0000 mg | ORAL_TABLET | Freq: Every day | ORAL | Status: DC
  Administered 2013-06-19: 20 mg via ORAL
  Filled 2013-06-19 (×2): qty 1

## 2013-06-19 MED ORDER — FAMCICLOVIR 500 MG PO TABS
250.0000 mg | ORAL_TABLET | Freq: Two times a day (BID) | ORAL | Status: DC | PRN
  Filled 2013-06-19: qty 0.5

## 2013-06-19 MED ORDER — OXYCODONE HCL 5 MG PO TABS
ORAL_TABLET | ORAL | Status: AC
  Filled 2013-06-19: qty 1

## 2013-06-19 MED ORDER — MENTHOL 3 MG MT LOZG: 1.0000 | LOZENGE | OROMUCOSAL | Status: DC | PRN

## 2013-06-19 MED ORDER — ONDANSETRON HCL 4 MG/2ML IJ SOLN
INTRAMUSCULAR | Status: DC | PRN
  Administered 2013-06-19: 4 mg via INTRAVENOUS

## 2013-06-19 MED ORDER — DEXAMETHASONE SODIUM PHOSPHATE 10 MG/ML IJ SOLN
10.0000 mg | Freq: Three times a day (TID) | INTRAMUSCULAR | Status: AC
Start: 2013-06-19 — End: 2013-06-20
  Filled 2013-06-19 (×3): qty 1

## 2013-06-19 MED ORDER — SODIUM CHLORIDE 0.9 % IR SOLN
Status: DC | PRN
  Administered 2013-06-19 (×3): 1000 mL

## 2013-06-19 MED ORDER — CEFAZOLIN SODIUM-DEXTROSE 2-3 GM-% IV SOLR
2.0000 g | Freq: Four times a day (QID) | INTRAVENOUS | Status: AC
  Administered 2013-06-19 (×2): 2 g via INTRAVENOUS
  Filled 2013-06-19 (×2): qty 50

## 2013-06-19 MED ORDER — BUPIVACAINE-EPINEPHRINE PF 0.25-1:200000 % IJ SOLN
INTRAMUSCULAR | Status: AC
  Filled 2013-06-19: qty 30

## 2013-06-19 MED ORDER — ROCURONIUM BROMIDE 50 MG/5ML IV SOLN
INTRAVENOUS | Status: AC
  Filled 2013-06-19: qty 1

## 2013-06-19 MED ORDER — ONDANSETRON HCL 4 MG/2ML IJ SOLN: 4.0000 mg | Freq: Four times a day (QID) | INTRAMUSCULAR | Status: DC | PRN

## 2013-06-19 MED ORDER — LEVOTHYROXINE SODIUM 75 MCG PO TABS
75.0000 ug | ORAL_TABLET | Freq: Every day | ORAL | Status: DC
  Administered 2013-06-20: 75 ug via ORAL
  Filled 2013-06-19 (×2): qty 1

## 2013-06-19 MED ORDER — FENTANYL CITRATE 0.05 MG/ML IJ SOLN
INTRAMUSCULAR | Status: AC
  Filled 2013-06-19: qty 5

## 2013-06-19 MED ORDER — THERA M PLUS PO TABS: 1.0000 | ORAL_TABLET | Freq: Every day | ORAL | Status: DC

## 2013-06-19 MED ORDER — DEXAMETHASONE 6 MG PO TABS
10.0000 mg | ORAL_TABLET | Freq: Three times a day (TID) | ORAL | Status: AC
  Administered 2013-06-19 – 2013-06-20 (×3): 10 mg via ORAL
  Filled 2013-06-19 (×3): qty 1

## 2013-06-19 MED ORDER — BISACODYL 5 MG PO TBEC
10.0000 mg | DELAYED_RELEASE_TABLET | Freq: Every day | ORAL | Status: DC
  Administered 2013-06-19: 10 mg via ORAL
  Filled 2013-06-19: qty 2

## 2013-06-19 MED ORDER — VITAMIN D (ERGOCALCIFEROL) 1.25 MG (50000 UNIT) PO CAPS
50000.0000 [IU] | ORAL_CAPSULE | ORAL | Status: DC
Start: 2013-06-20 — End: 2013-06-20
  Administered 2013-06-20: 50000 [IU] via ORAL
  Filled 2013-06-19: qty 1

## 2013-06-19 MED ORDER — CALCIUM CARBONATE-VITAMIN D 500-200 MG-UNIT PO TABS
1.0000 | ORAL_TABLET | Freq: Two times a day (BID) | ORAL | Status: DC
  Administered 2013-06-20: 1 via ORAL
  Filled 2013-06-19 (×2): qty 1

## 2013-06-19 MED ORDER — RIVAROXABAN 10 MG PO TABS
10.0000 mg | ORAL_TABLET | Freq: Every day | ORAL | Status: DC
  Administered 2013-06-20: 10 mg via ORAL
  Filled 2013-06-19 (×2): qty 1

## 2013-06-19 MED ORDER — PANTOPRAZOLE SODIUM 40 MG PO TBEC
80.0000 mg | DELAYED_RELEASE_TABLET | Freq: Every day | ORAL | Status: DC
Start: 2013-06-19 — End: 2013-06-20
  Administered 2013-06-19: 80 mg via ORAL
  Filled 2013-06-19: qty 2

## 2013-06-19 MED ORDER — CITALOPRAM HYDROBROMIDE 20 MG PO TABS
20.0000 mg | ORAL_TABLET | Freq: Every day | ORAL | Status: DC
  Administered 2013-06-20: 20 mg via ORAL
  Filled 2013-06-19: qty 1

## 2013-06-19 MED ORDER — HYDROMORPHONE HCL PF 1 MG/ML IJ SOLN
INTRAMUSCULAR | Status: AC
  Filled 2013-06-19: qty 1

## 2013-06-19 MED ORDER — METOCLOPRAMIDE HCL 10 MG PO TABS: 5.0000 mg | ORAL_TABLET | Freq: Three times a day (TID) | ORAL | Status: DC | PRN

## 2013-06-19 MED ORDER — PROPOFOL 10 MG/ML IV BOLUS
INTRAVENOUS | Status: DC | PRN
  Administered 2013-06-19: 150 mg via INTRAVENOUS

## 2013-06-19 MED ORDER — HYDROMORPHONE HCL PF 1 MG/ML IJ SOLN
0.2500 mg | INTRAMUSCULAR | Status: DC | PRN
  Administered 2013-06-19: 0.5 mg via INTRAVENOUS

## 2013-06-19 MED ORDER — BUPIVACAINE-EPINEPHRINE PF 0.5-1:200000 % IJ SOLN
INTRAMUSCULAR | Status: DC | PRN
  Administered 2013-06-19: 125 mg via PERINEURAL

## 2013-06-19 MED ORDER — POTASSIUM CHLORIDE IN NACL 20-0.9 MEQ/L-% IV SOLN
INTRAVENOUS | Status: DC
  Administered 2013-06-19 – 2013-06-20 (×2): via INTRAVENOUS
  Filled 2013-06-19 (×4): qty 1000

## 2013-06-19 MED ORDER — FENTANYL CITRATE 0.05 MG/ML IJ SOLN
INTRAMUSCULAR | Status: DC | PRN
  Administered 2013-06-19: 50 ug via INTRAVENOUS
  Administered 2013-06-19: 25 ug via INTRAVENOUS
  Administered 2013-06-19: 50 ug via INTRAVENOUS
  Administered 2013-06-19: 25 ug via INTRAVENOUS
  Administered 2013-06-19 (×2): 50 ug via INTRAVENOUS

## 2013-06-19 MED ORDER — ONDANSETRON HCL 4 MG/2ML IJ SOLN
INTRAMUSCULAR | Status: AC
  Filled 2013-06-19: qty 2

## 2013-06-19 MED ORDER — METOCLOPRAMIDE HCL 5 MG/ML IJ SOLN: 5.0000 mg | Freq: Three times a day (TID) | INTRAMUSCULAR | Status: DC | PRN

## 2013-06-19 MED ORDER — FAMCICLOVIR 125 MG PO TABS: 250.0000 mg | ORAL_TABLET | Freq: Two times a day (BID) | ORAL | Status: DC | PRN

## 2013-06-19 MED ORDER — POLYETHYL GLYCOL-PROPYL GLYCOL 0.4-0.3 % OP SOLN: 2.0000 [drp] | Freq: Two times a day (BID) | OPHTHALMIC | Status: DC | PRN

## 2013-06-19 MED ORDER — KETOROLAC TROMETHAMINE 30 MG/ML IJ SOLN
INTRAMUSCULAR | Status: AC
  Administered 2013-06-19: 15 mg
  Filled 2013-06-19: qty 1

## 2013-06-19 MED ORDER — DIPHENHYDRAMINE HCL 12.5 MG/5ML PO ELIX: 12.5000 mg | ORAL_SOLUTION | ORAL | Status: DC | PRN

## 2013-06-19 MED ORDER — MIDAZOLAM HCL 2 MG/2ML IJ SOLN
INTRAMUSCULAR | Status: AC
  Filled 2013-06-19: qty 2

## 2013-06-19 MED ORDER — LACTATED RINGERS IV SOLN
INTRAVENOUS | Status: DC | PRN
  Administered 2013-06-19 (×2): via INTRAVENOUS

## 2013-06-19 MED ORDER — OXYCODONE HCL 5 MG PO TABS
5.0000 mg | ORAL_TABLET | ORAL | Status: DC | PRN
  Administered 2013-06-19 – 2013-06-20 (×4): 5 mg via ORAL
  Filled 2013-06-19 (×2): qty 1
  Filled 2013-06-19: qty 2

## 2013-06-19 MED ORDER — DEXAMETHASONE SODIUM PHOSPHATE 10 MG/ML IJ SOLN
INTRAMUSCULAR | Status: DC | PRN
  Administered 2013-06-19: 6 mg

## 2013-06-19 MED ORDER — POLYVINYL ALCOHOL 1.4 % OP SOLN
2.0000 [drp] | Freq: Two times a day (BID) | OPHTHALMIC | Status: DC | PRN
  Filled 2013-06-19: qty 15

## 2013-06-19 MED ORDER — PROPOFOL 10 MG/ML IV BOLUS
INTRAVENOUS | Status: AC
  Filled 2013-06-19: qty 20

## 2013-06-19 MED ORDER — ADULT MULTIVITAMIN W/MINERALS CH
1.0000 | ORAL_TABLET | Freq: Every day | ORAL | Status: DC
  Administered 2013-06-20: 1 via ORAL
  Filled 2013-06-19: qty 1

## 2013-06-19 MED ORDER — HYDROMORPHONE HCL PF 1 MG/ML IJ SOLN: 0.5000 mg | INTRAMUSCULAR | Status: DC | PRN

## 2013-06-19 MED ORDER — ACETAMINOPHEN 325 MG PO TABS: 650.0000 mg | ORAL_TABLET | Freq: Four times a day (QID) | ORAL | Status: DC | PRN

## 2013-06-19 MED ORDER — EPHEDRINE SULFATE 50 MG/ML IJ SOLN
INTRAMUSCULAR | Status: AC
  Filled 2013-06-19: qty 2

## 2013-06-19 MED ORDER — DOCUSATE SODIUM 100 MG PO CAPS: 100.0000 mg | ORAL_CAPSULE | Freq: Two times a day (BID) | ORAL | Status: DC

## 2013-06-19 MED ORDER — MIDAZOLAM HCL 5 MG/5ML IJ SOLN
INTRAMUSCULAR | Status: DC | PRN
  Administered 2013-06-19 (×2): 1 mg via INTRAVENOUS

## 2013-06-19 MED ORDER — KETOROLAC TROMETHAMINE 15 MG/ML IJ SOLN
15.0000 mg | Freq: Four times a day (QID) | INTRAMUSCULAR | Status: AC
Start: 2013-06-19 — End: 2013-06-20
  Administered 2013-06-19 – 2013-06-20 (×4): 15 mg via INTRAVENOUS
  Filled 2013-06-19 (×8): qty 1

## 2013-06-19 MED ORDER — PHENOL 1.4 % MT LIQD: 1.0000 | OROMUCOSAL | Status: DC | PRN

## 2013-06-19 MED ORDER — LIDOCAINE HCL (CARDIAC) 20 MG/ML IV SOLN
INTRAVENOUS | Status: AC
  Filled 2013-06-19: qty 5

## 2013-06-19 MED ORDER — BUPIVACAINE-EPINEPHRINE 0.25% -1:200000 IJ SOLN
INTRAMUSCULAR | Status: DC | PRN
  Administered 2013-06-19: 30 mL

## 2013-06-19 MED ORDER — DOCUSATE SODIUM 100 MG PO CAPS
100.0000 mg | ORAL_CAPSULE | Freq: Two times a day (BID) | ORAL | Status: DC
  Administered 2013-06-19 – 2013-06-20 (×3): 100 mg via ORAL
  Filled 2013-06-19 (×3): qty 1

## 2013-06-19 SURGICAL SUPPLY — 72 items
BANDAGE ESMARK 6X9 LF (GAUZE/BANDAGES/DRESSINGS) ×1 IMPLANT
BLADE SAGITTAL 25.0X1.19X90 (BLADE) ×2 IMPLANT
BLADE SAW SGTL 11.0X1.19X90.0M (BLADE) IMPLANT
BLADE SAW SGTL 13.0X1.19X90.0M (BLADE) ×2 IMPLANT
BLADE SURG 10 STRL SS (BLADE) ×4 IMPLANT
BNDG CMPR 9X6 STRL LF SNTH (GAUZE/BANDAGES/DRESSINGS) ×1
BNDG CMPR MED 15X6 ELC VLCR LF (GAUZE/BANDAGES/DRESSINGS) ×1
BNDG ELASTIC 6X15 VLCR STRL LF (GAUZE/BANDAGES/DRESSINGS) ×2 IMPLANT
BNDG ESMARK 6X9 LF (GAUZE/BANDAGES/DRESSINGS) ×2
BOWL SMART MIX CTS (DISPOSABLE) ×2 IMPLANT
CAPT RP KNEE ×2 IMPLANT
CEMENT HV SMART SET (Cement) ×4 IMPLANT
COVER SURGICAL LIGHT HANDLE (MISCELLANEOUS) ×2 IMPLANT
CUFF TOURNIQUET SINGLE 34IN LL (TOURNIQUET CUFF) ×2 IMPLANT
CUFF TOURNIQUET SINGLE 44IN (TOURNIQUET CUFF) IMPLANT
DRAPE EXTREMITY T 121X128X90 (DRAPE) ×2 IMPLANT
DRAPE INCISE IOBAN 66X45 STRL (DRAPES) IMPLANT
DRAPE PROXIMA HALF (DRAPES) ×2 IMPLANT
DRAPE U-SHAPE 47X51 STRL (DRAPES) ×2 IMPLANT
DRSG ADAPTIC 3X8 NADH LF (GAUZE/BANDAGES/DRESSINGS) ×2 IMPLANT
DURAPREP 26ML APPLICATOR (WOUND CARE) ×4 IMPLANT
ELECT CAUTERY BLADE 6.4 (BLADE) ×2 IMPLANT
ELECT REM PT RETURN 9FT ADLT (ELECTROSURGICAL) ×2
ELECTRODE REM PT RTRN 9FT ADLT (ELECTROSURGICAL) ×1 IMPLANT
EVACUATOR 1/8 PVC DRAIN (DRAIN) ×2 IMPLANT
FACESHIELD LNG OPTICON STERILE (SAFETY) ×2 IMPLANT
GLOVE BIO SURGEON STRL SZ7 (GLOVE) ×2 IMPLANT
GLOVE BIOGEL PI IND STRL 6.5 (GLOVE) ×2 IMPLANT
GLOVE BIOGEL PI IND STRL 7.0 (GLOVE) ×3 IMPLANT
GLOVE BIOGEL PI IND STRL 7.5 (GLOVE) ×1 IMPLANT
GLOVE BIOGEL PI INDICATOR 6.5 (GLOVE) ×2
GLOVE BIOGEL PI INDICATOR 7.0 (GLOVE) ×3
GLOVE BIOGEL PI INDICATOR 7.5 (GLOVE) ×1
GLOVE SS BIOGEL STRL SZ 7.5 (GLOVE) ×2 IMPLANT
GLOVE SUPERSENSE BIOGEL SZ 7.5 (GLOVE) ×2
GLOVE SURG SS PI 6.5 STRL IVOR (GLOVE) ×8 IMPLANT
GLOVE SURG SS PI 7.0 STRL IVOR (GLOVE) ×2 IMPLANT
GOWN STRL REUS W/ TWL LRG LVL3 (GOWN DISPOSABLE) ×2 IMPLANT
GOWN STRL REUS W/ TWL XL LVL3 (GOWN DISPOSABLE) ×2 IMPLANT
GOWN STRL REUS W/TWL LRG LVL3 (GOWN DISPOSABLE) ×4
GOWN STRL REUS W/TWL XL LVL3 (GOWN DISPOSABLE) ×4
HANDPIECE INTERPULSE COAX TIP (DISPOSABLE) ×2
HOOD PEEL AWAY FACE SHEILD DIS (HOOD) ×4 IMPLANT
IMMOBILIZER KNEE 22 (SOFTGOODS) ×2 IMPLANT
IMMOBILIZER KNEE 22 UNIV (SOFTGOODS) IMPLANT
KIT BASIN OR (CUSTOM PROCEDURE TRAY) ×2 IMPLANT
KIT ROOM TURNOVER OR (KITS) ×2 IMPLANT
MANIFOLD NEPTUNE II (INSTRUMENTS) ×2 IMPLANT
NS IRRIG 1000ML POUR BTL (IV SOLUTION) ×2 IMPLANT
PACK TOTAL JOINT (CUSTOM PROCEDURE TRAY) ×2 IMPLANT
PAD ABD 8X10 STRL (GAUZE/BANDAGES/DRESSINGS) ×4 IMPLANT
PAD ARMBOARD 7.5X6 YLW CONV (MISCELLANEOUS) ×4 IMPLANT
PADDING CAST COTTON 6X4 STRL (CAST SUPPLIES) ×2 IMPLANT
RUBBERBAND STERILE (MISCELLANEOUS) ×2 IMPLANT
SET HNDPC FAN SPRY TIP SCT (DISPOSABLE) ×1 IMPLANT
SPONGE GAUZE 4X4 12PLY (GAUZE/BANDAGES/DRESSINGS) ×4 IMPLANT
STRIP CLOSURE SKIN 1/2X4 (GAUZE/BANDAGES/DRESSINGS) ×2 IMPLANT
SUCTION FRAZIER TIP 10 FR DISP (SUCTIONS) ×2 IMPLANT
SUT ETHIBOND NAB CT1 #1 30IN (SUTURE) ×2 IMPLANT
SUT MNCRL AB 3-0 PS2 18 (SUTURE) ×2 IMPLANT
SUT VIC AB 0 CT1 27 (SUTURE) ×4
SUT VIC AB 0 CT1 27XBRD ANBCTR (SUTURE) ×2 IMPLANT
SUT VIC AB 2-0 CT1 27 (SUTURE) ×4
SUT VIC AB 2-0 CT1 TAPERPNT 27 (SUTURE) ×2 IMPLANT
SYR 30ML SLIP (SYRINGE) ×2 IMPLANT
TAPE HY-TAPE 1X5Y PINK NS LF (GAUZE/BANDAGES/DRESSINGS) ×2 IMPLANT
TOWEL OR 17X24 6PK STRL BLUE (TOWEL DISPOSABLE) ×2 IMPLANT
TOWEL OR 17X26 10 PK STRL BLUE (TOWEL DISPOSABLE) ×2 IMPLANT
TRAY FOLEY CATH 16FR SILVER (SET/KITS/TRAYS/PACK) ×2 IMPLANT
TUBE CONNECTING 12X1/4 (SUCTIONS) ×2 IMPLANT
WATER STERILE IRR 1000ML POUR (IV SOLUTION) ×2 IMPLANT
YANKAUER SUCT BULB TIP NO VENT (SUCTIONS) ×2 IMPLANT

## 2013-06-19 NOTE — Anesthesia Preprocedure Evaluation (Signed)
Anesthesia Evaluation  Patient identified by MRN, date of birth, ID band Patient awake    Reviewed: Allergy & Precautions, H&P , NPO status , Patient's Chart, lab work & pertinent test results  Airway Mallampati: II TM Distance: >3 FB Neck ROM: Full    Dental  (+) Teeth Intact, Dental Advisory Given   Pulmonary COPD COPD inhaler, former smoker,    Pulmonary exam normal       Cardiovascular hypertension, Pt. on home beta blockers     Neuro/Psych negative neurological ROS  negative psych ROS   GI/Hepatic Neg liver ROS, GERD-  Medicated,  Endo/Other  Hypothyroidism   Renal/GU negative Renal ROS     Musculoskeletal   Abdominal   Peds  Hematology   Anesthesia Other Findings   Reproductive/Obstetrics                           Anesthesia Physical Anesthesia Plan  ASA: III  Anesthesia Plan: General   Post-op Pain Management:    Induction: Intravenous  Airway Management Planned: LMA  Additional Equipment:   Intra-op Plan:   Post-operative Plan: Extubation in OR  Informed Consent: I have reviewed the patients History and Physical, chart, labs and discussed the procedure including the risks, benefits and alternatives for the proposed anesthesia with the patient or authorized representative who has indicated his/her understanding and acceptance.   Dental advisory given  Plan Discussed with: CRNA, Anesthesiologist and Surgeon  Anesthesia Plan Comments:         Anesthesia Quick Evaluation

## 2013-06-19 NOTE — Progress Notes (Signed)
Orthopedic Tech Progress Note Patient Details:  VONCEIL UPSHUR Apr 27, 1935 [id number]  CPM Right Knee CPM Right Knee: On Right Knee Flexion (Degrees): 60 Right Knee Extension (Degrees): 0 Additional Comments: Trapeze bar and foot roll   Cammer, Theodoro Parma 06/19/2013, 11:38 AM

## 2013-06-19 NOTE — Interval H&P Note (Signed)
History and Physical Interval Note:  06/19/2013 8:47 AM  Darlene [name]  has presented today for surgery, with the diagnosis of DEGENERATIVE JOINT DISEASE RIGHT KNEE  The various methods of treatment have been discussed with the patient and family. After consideration of risks, benefits and other options for treatment, the patient has consented to  Procedure(s): TOTAL KNEE ARTHROPLASTY (Right) as a surgical intervention .  The patient's history has been reviewed, patient examined, no change in status, stable for surgery.  I have reviewed the patient's chart and labs.  Questions were answered to the patient's satisfaction.     Elsie Saas A

## 2013-06-19 NOTE — Transfer of Care (Signed)
Immediate Anesthesia Transfer of Care Note  Patient: Darlene [name]  Procedure(s) Performed: Procedure(s): TOTAL KNEE ARTHROPLASTY (Right)  Patient Location: PACU  Anesthesia Type:General and Regional  Level of Consciousness: awake, alert  and oriented  Airway & Oxygen Therapy: Patient Spontanous Breathing  Post-op Assessment: Report given to PACU RN  Post vital signs: Reviewed and stable  Complications: No apparent anesthesia complications

## 2013-06-19 NOTE — Preoperative (Signed)
Beta Blockers   Reason not to administer Beta Blockers:Not Applicable 

## 2013-06-19 NOTE — H&P (View-Only) (Signed)
TOTAL KNEE ADMISSION H&P  Patient is being admitted for right total knee arthroplasty.  Subjective:  Chief Complaint:right knee pain.  HPI: Darlene [name], 79 y.o. female, has a history of pain and functional disability in the right knee due to arthritis and has failed non-surgical conservative treatments for greater than 12 weeks to includeNSAID's and/or analgesics, corticosteriod injections, viscosupplementation injections, flexibility and strengthening excercises, supervised PT with diminished ADL's post treatment, use of assistive devices and activity modification.  Onset of symptoms was gradual, starting 10 years ago with gradually worsening course since that time. The patient noted prior procedures on the knee to include  arthroscopy and menisectomy on the right knee(s).  Patient currently rates pain in the right knee(s) at 10 out of 10 with activity. Patient has night pain, worsening of pain with activity and weight bearing, pain that interferes with activities of daily living, crepitus and joint swelling.  Patient has evidence of subchondral sclerosis, joint subluxation and joint space narrowing by imaging studies.There is no active infection.  Patient Active Problem List   Diagnosis Date Noted  . Right knee DJD   . Hyperlipidemia   . Lymphoma   . Cancer of breast   . Thyroid disease   . Chest pain 12/26/2012  . Unstable angina 12/03/2012  . Osteoporosis, unspecified 12/03/2012  . Palpitations 12/03/2012  . Dyslipidemia 12/03/2012  . Breast cancer, stage 1, estrogen receptor negative 07/06/2011  . Lymphoma of lymph nodes of head, face, and/or neck 07/06/2011  . Hypothyroid 07/06/2011  . Chronic ITP (idiopathic thrombocytopenia) 07/06/2011   Past Medical History  Diagnosis Date  . Thyroid disease   . Cancer of breast   . Lymphoma   . Breast cancer, stage 1, estrogen receptor negative 07/06/2011  . Lymphoma of lymph nodes of head, face, and/or neck 07/06/2011  . Hypothyroid  07/06/2011  . Chronic ITP (idiopathic thrombocytopenia) 07/06/2011  . Hyperlipidemia   . Right knee DJD     Past Surgical History  Procedure Laterality Date  . Breast surgery    . Knee surgery       (Not in a hospital admission) Allergies  Allergen Reactions  . Codeine Itching           . Doxycycline Swelling and Other (See Comments)    Burns throat also   . Meperidine And Related Itching and Rash    Makes cheeks red also  . Sulfa Antibiotics Itching    Current Outpatient Prescriptions on File Prior to Visit  Medication Sig Dispense Refill  . aspirin EC 81 MG tablet Take 81 mg by mouth daily.      . Calcium Carbonate-Vitamin D (CALCIUM 600+D) 600-200 MG-UNIT TABS Take 1 tablet by mouth 2 (two) times daily.       . citalopram (CELEXA) 10 MG tablet Take 20 mg by mouth daily.       . Cyanocobalamin (VITAMIN B-12 PO) Take 1,000 mcg by mouth daily.      Marland Kitchen docusate sodium (COLACE) 100 MG capsule Take 100 mg by mouth 2 (two) times daily.      Marland Kitchen esomeprazole (NEXIUM) 40 MG capsule Take 40 mg by mouth daily with supper.       . famciclovir (FAMVIR) 125 MG tablet Take 250 mg by mouth 2 (two) times daily as needed. For 5 days as needed for outbreak      . levothyroxine (SYNTHROID, LEVOTHROID) 75 MCG tablet Take 75 mcg by mouth daily.        Marland Kitchen  lovastatin (MEVACOR) 20 MG tablet Take 20 mg by mouth at bedtime.        . metoprolol succinate (TOPROL-XL) 25 MG 24 hr tablet Take 25 mg by mouth daily.       . mometasone (ELOCON) 0.1 % cream Apply 1 application topically daily as needed (for itching).       . Multiple Vitamins-Minerals (MULTIVITAMINS THER. W/MINERALS) TABS Take 1 tablet by mouth daily.        Vladimir Faster Glycol-Propyl Glycol (SYSTANE) 0.4-0.3 % SOLN Apply 2 drops to eye 2 (two) times daily as needed (for dry eyes).      . traMADol (ULTRAM) 50 MG tablet Take 50 mg by mouth every 4 (four) hours as needed for moderate pain.       . Vitamin D, Ergocalciferol, (DRISDOL) 50000 UNITS CAPS  Take 50,000 Units by mouth every 14 (fourteen) days.         No current facility-administered medications on file prior to visit.   History  Substance Use Topics  . Smoking status: Former Smoker -- 0.30 packs/day    Types: Cigarettes    Quit date: 09/29/1981  . Smokeless tobacco: Never Used  . Alcohol Use: 0.5 oz/week    1 drink(s) per week     Comment: glass for supper/lunch    Family History  Problem Relation Age of Onset  . Hyperlipidemia Father   . Heart disease Father   . Heart disease Sister   . Cancer Brother   . Diabetes Son   . Heart disease Maternal Grandfather      Review of Systems  Constitutional: Negative.   HENT: Negative.   Eyes: Negative.   Cardiovascular: Negative.   Gastrointestinal: Negative.   Genitourinary: Negative.   Musculoskeletal: Positive for back pain and joint pain.  Skin: Negative.   Neurological: Negative.   Endo/Heme/Allergies: Negative.   Psychiatric/Behavioral: Negative.     Objective:  Physical Exam  Constitutional: She is oriented to person, place, and time. She appears well-developed and well-nourished.  HENT:  Head: Normocephalic and atraumatic.  Eyes: Conjunctivae and EOM are normal. Pupils are equal, round, and reactive to light.  Neck: Normal range of motion. Neck supple.  Cardiovascular: Normal rate, regular rhythm and normal heart sounds.   Respiratory: Effort normal and breath sounds normal.  GI: Soft. Bowel sounds are normal.  Genitourinary:  Not pertinent to current symptomatology therefore not examined.  Musculoskeletal:  Examination of her right knee reveals pain medially and laterally, 1+ crepitation 1+ synovitis moderate varus deformity, range of motion -5 to 125 degrees with diffuse pain, knee is stable with normal patella tracking. Exam of her left knee reveals mild pain full range of motion knee is stable with normal patella tracking. Vascular exam: pulses 2+ and symmetric.  Neurological: She is alert and  oriented to person, place, and time.  Skin: Skin is warm and dry.  Psychiatric: She has a normal mood and affect. Her behavior is normal.    Vital signs in last 24 hours: Last recorded: 02/04 1300   BP: 117/68 Pulse: 80  Temp: 97.7 F (36.5 C)    Height: 5\' 1"  (1.549 m) SpO2: 96  Weight: 66.225 kg (146 lb)     Labs:   Estimated body mass index is 27.6 kg/(m^2) as calculated from the following:   Height as of this encounter: 5\' 1"  (1.549 m).   Weight as of this encounter: 66.225 kg (146 lb).   Imaging Review Plain radiographs demonstrate severe degenerative joint  disease of the right knee(s). The overall alignment issignificant varus. The bone quality appears to be good for age and reported activity level.  Assessment/Plan:  End stage arthritis, right knee   The patient history, physical examination, clinical judgment of the provider and imaging studies are consistent with end stage degenerative joint disease of the right knee(s) and total knee arthroplasty is deemed medically necessary. The treatment options including medical management, injection therapy arthroscopy and arthroplasty were discussed at length. The risks and benefits of total knee arthroplasty were presented and reviewed. The risks due to aseptic loosening, infection, stiffness, patella tracking problems, thromboembolic complications and other imponderables were discussed. The patient acknowledged the explanation, agreed to proceed with the plan and consent was signed. Patient is being admitted for inpatient treatment for surgery, pain control, PT, OT, prophylactic antibiotics, VTE prophylaxis, progressive ambulation and ADL's and discharge planning. The patient is planning to be discharged home with home health services  Tawanna Funk A. Kaleen Mask Physician Assistant [facility] [phone]  06/07/2013, 2:51 PM

## 2013-06-19 NOTE — Anesthesia Postprocedure Evaluation (Signed)
Anesthesia Post Note  Patient: Darlene [name]  Procedure(s) Performed: Procedure(s) (LRB): TOTAL KNEE ARTHROPLASTY (Right)  Anesthesia type: general  Patient location: PACU  Post pain: Pain level controlled  Post assessment: Patient's Cardiovascular Status Stable  Last Vitals:  Filed Vitals:   06/19/13 1215  BP: 133/71  Pulse: 85  Temp:   Resp: 16    Post vital signs: Reviewed and stable  Level of consciousness: sedated  Complications: No apparent anesthesia complications

## 2013-06-19 NOTE — Op Note (Signed)
MRN:     [id number] DOB/AGE:    77-Aug-1936 / 77 y.o.       OPERATIVE REPORT    DATE OF PROCEDURE:  06/19/2013       PREOPERATIVE DIAGNOSIS:   DEGENERATIVE JOINT DISEASE RIGHT KNEE      Estimated body mass index is 28.71 kg/(m^2) as calculated from the following:   Height as of 06/09/13: 5\' 2"  (1.575 m).   Weight as of 02/10/13: 71.215 kg (157 lb).                                                        POSTOPERATIVE DIAGNOSIS:   DEGENERATIVE JOINT DISEASE RIGHT KNEE                                                                      PROCEDURE:  Procedure(s): TOTAL KNEE ARTHROPLASTY Using Depuy Sigma RP implants #3 Femur, #4 revision Tibia, 102mm sigma RP bearing, 32 Patella     SURGEON: Lemmie Steinhaus A    ASSISTANT:  Kirstin Shepperson PA-C   (Present and scrubbed throughout the case, critical for assistance with exposure, retraction, instrumentation, and closure.)         ANESTHESIA: GET with Femoral Nerve Block  DRAINS: foley, 2 medium hemovac in knee   TOURNIQUET TIME: [id number]   COMPLICATIONS:  None     SPECIMENS: None   INDICATIONS FOR PROCEDURE: The patient has  DEGENERATIVE JOINT DISEASE RIGHT KNEE, varus deformities, XR shows bone on bone arthritis. Patient has failed all conservative measures including anti-inflammatory medicines, narcotics, attempts at  exercise and weight loss, cortisone injections and viscosupplementation.  Risks and benefits of surgery have been discussed, questions answered.   DESCRIPTION OF PROCEDURE: The patient identified by armband, received  right femoral nerve block and IV antibiotics, in the holding area at [facility]. Patient taken to the operating room, appropriate anesthetic  monitors were attached General endotracheal anesthesia induced with  the patient in supine position, Foley catheter was inserted. Tourniquet  applied high to the operative thigh. Lateral post and foot positioner  applied to the table, the lower extremity was then  prepped and draped  in usual sterile fashion from the ankle to the tourniquet. Time-out procedure was performed. The limb was wrapped with an Esmarch bandage and the tourniquet inflated to 365 mmHg. We began the operation by making the anterior midline incision starting at handbreadth above the patella going over the patella 1 cm medial to and  4 cm distal to the tibial tubercle. Small bleeders in the skin and the  subcutaneous tissue identified and cauterized. Transverse retinaculum was incised and reflected medially and a medial parapatellar arthrotomy was accomplished. the patella was everted and theprepatellar fat pad resected. The superficial medial collateral  ligament was then elevated from anterior to posterior along the proximal  flare of the tibia and anterior half of the menisci resected. The knee was hyperflexed exposing bone on bone arthritis. Peripheral and notch osteophytes as well as the cruciate ligaments were then resected. We continued to  work our way around posteriorly  along the proximal tibia, and externally  rotated the tibia subluxing it out from underneath the femur. A McHale  retractor was placed through the notch and a lateral Hohmann retractor  placed, and we then drilled through the proximal tibia in line with the  axis of the tibia followed by an intramedullary guide rod and 2-degree  posterior slope cutting guide. The tibial cutting guide was pinned into place  allowing resection of 4 mm of bone medially and about 6 mm of bone  laterally because of her varus deformity. Satisfied with the tibial resection, we then  entered the distal femur 2 mm anterior to the PCL origin with the  intramedullary guide rod and applied the distal femoral cutting guide  set at 36mm, with 5 degrees of valgus. This was pinned along the  epicondylar axis. At this point, the distal femoral cut was accomplished without difficulty. We then sized for a #3 femoral component and pinned the guide in  3 degrees of external rotation.The chamfer cutting guide was pinned into place. The anterior, posterior, and chamfer cuts were accomplished without difficulty followed by  the Sigma RP box cutting guide and the box cut. We also removed posterior osteophytes from the posterior femoral condyles. At this  time, the knee was brought into full extension. We checked our  extension and flexion gaps and found them symmetric at 77mm.  The patella thickness measured at 21 mm. We set the cutting guide at 12 and removed the posterior 9.5-10 mm  of the patella sized for 32 button and drilled the lollipop. The knee  was then once again hyperflexed exposing the proximal tibia. We sized for a #4 revision tibial base plate due to her severe osteopenia, applied the smokestack and the conical reamer followed by the the Delta fin keel punch. We then hammered into place the Sigma RP trial femoral component, inserted a 10-mm trial bearing, trial patellar button, and took the knee through range of motion from 0-130 degrees. No thumb pressure was required for patellar  tracking. At this point, all trial components were removed, a double batch of DePuy HV cement  was mixed and applied to all bony metallic mating surfaces except for the posterior condyles of the femur itself. In order, we  hammered into place the tibial tray and removed excess cement, the femoral component and removed excess cement, a 10-mm Sigma RP bearing  was inserted, and the knee brought to full extension with compression.  The patellar button was clamped into place, and excess cement  removed. While the cement cured the wound was irrigated out with normal saline solution pulse lavage, and medium Hemovac drains were placed.. Ligament stability and patellar tracking were checked and found to be excellent. The tourniquet was then released and hemostasis was obtained with cautery. The parapatellar arthrotomy was closed with  #1 ethibond suture. The subcutaneous  tissue with 0 and 2-0 undyed  Vicryl suture, and 4-0 Monocryl.. A dressing of Xeroform,  4 x 4, dressing sponges, Webril, and Ace wrap applied. Needle and sponge count were correct times 2.The patient awakened, extubated, and taken to recovery room without difficulty. Vascular status was normal, pulses 2+ and symmetric.   Darlene [name] A 06/19/2013, 10:28 AM

## 2013-06-19 NOTE — Anesthesia Procedure Notes (Signed)
Anesthesia Regional Block:  Adductor canal block  Pre-Anesthetic Checklist: ,, timeout performed, Correct Patient, Correct Site, Correct Laterality, Correct Procedure, Correct Position, site marked, Risks and benefits discussed,  Surgical consent,  Pre-op evaluation,  At surgeon's request and post-op pain management  Laterality: Right  Prep: chloraprep       Needles:  Injection technique: Single-shot  Needle Type: Echogenic Stimulator Needle     Needle Length: 5cm 5 cm Needle Gauge: 22 and 22 G    Additional Needles:  Procedures: ultrasound guided (picture in chart) and nerve stimulator Adductor canal block Narrative:  Start time: 06/19/2013 8:11 AM End time: 06/19/2013 8:21 AM Injection made incrementally with aspirations every 5 mL.  Performed by: Personally  Anesthesiologist: Earnest Bailey, MD  Additional Notes: Functioning IV was confirmed and monitors were applied.  A 61mm 22ga Arrow echogenic stimulator needle was used. Sterile prep and drape,hand hygiene and sterile gloves were used. Ultrasound guidance: relevant anatomy identified, needle position confirmed, local anesthetic spread visualized around nerve(s)., vascular puncture avoided.  Image printed for medical record. Negative aspiration and negative test dose prior to incremental administration of local anesthetic. The patient tolerated the procedure well.

## 2013-06-19 NOTE — Evaluation (Signed)
Physical Therapy Evaluation Patient Details Name: Darlene [name] MRN: [id number] DOB: 12-23-1934 Today's Date: 06/19/2013 Time: 3329-5188 PT Time Calculation (min): 20 min  PT Assessment / Plan / Recommendation History of Present Illness  Pt admitted for R TKA.  Clinical Impression  Pt is s/p TKA resulting in the deficits listed below (see PT Problem List). Pt tolerated OOB mobiltiy well for POD #0. Pt will benefit from skilled PT to increase their independence and safety with mobility to allow discharge to the venue listed below. Pt with good home set up and 24/7 assist. Anticipate pt to progress well.     PT Assessment  Patient needs continued PT services    Follow Up Recommendations  No PT follow up;Supervision/Assistance - 24 hour    Does the patient have the potential to tolerate intense rehabilitation      Barriers to Discharge        Equipment Recommendations       Recommendations for Other Services     Frequency 7X/week    Precautions / Restrictions Precautions Precautions: Knee;Fall Precaution Booklet Issued: Yes (comment) Precaution Comments: pt drowsy but able to follow directions for ther ex Required Braces or Orthoses: Knee Immobilizer - Right Knee Immobilizer - Right: Discontinue once straight leg raise with < 10 degree lag Restrictions Weight Bearing Restrictions: No   Pertinent Vitals/Pain 6/10 R knee pain, pain meds administered      Mobility  Bed Mobility Overal bed mobility: Needs Assistance Bed Mobility: Supine to Sit Supine to sit: Min assist General bed mobility comments: assist for R LE Management, directional v/c's Transfers Overall transfer level: Needs assistance Transfers: Sit to/from Stand;Stand Pivot Transfers Sit to Stand: Min assist Stand pivot transfers: Min assist General transfer comment: max directional v/c's for walker sequencing Ambulation/Gait Ambulation/Gait assistance: Min assist Ambulation Distance (Feet): 5  Feet Assistive device: Rolling walker (2 wheeled) Gait Pattern/deviations: Step-to pattern;Decreased step length - right;Decreased stance time - right;Decreased stride length;Antalgic Gait velocity: slow General Gait Details: max directional v/c's for sequencing    Exercises Total Joint Exercises Ankle Circles/Pumps: AROM;Both;10 reps Quad Sets: AROM;Right;10 reps Goniometric ROM: 60 deg AAROM to R knee   PT Diagnosis: Difficulty walking;Acute pain  PT Problem List: Decreased strength;Decreased range of motion;Decreased activity tolerance;Decreased balance;Decreased mobility PT Treatment Interventions: DME instruction;Gait training;Stair training;Functional mobility training;Therapeutic activities;Therapeutic exercise;Balance training     PT Goals(Current goals can be found in the care plan section) Acute Rehab PT Goals Patient Stated Goal: home PT Goal Formulation: With patient Time For Goal Achievement: 06/26/13 Potential to Achieve Goals: Good  Visit Information  Last PT Received On: 06/19/13 Assistance Needed: +1 History of Present Illness: Pt admitted for R TKA.       Prior [place] expects to be discharged to:: Private residence Living Arrangements: Spouse/significant other Available Help at Discharge: Available 24 hours/day;Personal care attendant Type of Home: House Home Access:  (1 STE) Home Layout: One level Home Equipment: Walker - 2 wheels Additional Comments: [facility] will be providing 24/7 assist for 2 weeks Prior Function Level of Independence: Independent Communication Communication: No difficulties Dominant Hand: Right    Cognition  Cognition Arousal/Alertness: Awake/alert Behavior During Therapy: WFL for tasks assessed/performed Overall Cognitive Status: Within Functional Limits for tasks assessed (mild delayed processing, most likely due to pain medicine)    Extremity/Trunk Assessment Upper Extremity  Assessment Upper Extremity Assessment: Overall WFL for tasks assessed Lower Extremity Assessment Lower Extremity Assessment: RLE deficits/detail RLE Deficits / Details: able  to complete R quad set Cervical / Trunk Assessment Cervical / Trunk Assessment: Normal   Balance    End of Session PT - End of Session Equipment Utilized During Treatment: Gait belt;Right knee immobilizer Activity Tolerance: Patient tolerated treatment well Patient left: in chair;with call bell/phone within reach (pastore present) Nurse Communication: Mobility status CPM Right Knee CPM Right Knee: Off  GP     Kingsley Callander 06/19/2013, 4:05 PM  Kittie Plater, PT, DPT Pager #: [phone] Office #: [phone]

## 2013-06-19 NOTE — Progress Notes (Signed)
Utilization review completed.  

## 2013-06-20 DIAGNOSIS — M1990 Unspecified osteoarthritis, unspecified site: Secondary | ICD-10-CM | POA: Diagnosis not present

## 2013-06-20 DIAGNOSIS — M25569 Pain in unspecified knee: Secondary | ICD-10-CM | POA: Diagnosis not present

## 2013-06-20 LAB — CBC
HCT: 28.4 % — ABNORMAL LOW (ref 36.0–46.0)
Hemoglobin: 9.4 g/dL — ABNORMAL LOW (ref 12.0–15.0)
MCH: 29 pg (ref 26.0–34.0)
MCHC: 33.1 g/dL (ref 30.0–36.0)
MCV: 87.7 fL (ref 78.0–100.0)
Platelets: 179 10*3/uL (ref 150–400)
RBC: 3.24 MIL/uL — ABNORMAL LOW (ref 3.87–5.11)
RDW: 14 % (ref 11.5–15.5)
WBC: 10.5 10*3/uL (ref 4.0–10.5)

## 2013-06-20 LAB — BASIC METABOLIC PANEL
BUN: 17 mg/dL (ref 6–23)
CO2: 22 mEq/L (ref 19–32)
CREATININE: 0.55 mg/dL (ref 0.50–1.10)
Calcium: 8.8 mg/dL (ref 8.4–10.5)
Chloride: 104 mEq/L (ref 96–112)
GFR, EST NON AFRICAN AMERICAN: 89 mL/min — AB (ref >90)
Glucose, Bld: 170 mg/dL — ABNORMAL HIGH (ref 70–99)
POTASSIUM: 4.4 meq/L (ref 3.7–5.3)
Sodium: 137 mEq/L (ref 137–147)

## 2013-06-20 MED ORDER — OXYCODONE HCL 5 MG PO TABS: ORAL_TABLET | ORAL | Status: DC

## 2013-06-20 MED ORDER — BISACODYL 5 MG PO TBEC: DELAYED_RELEASE_TABLET | ORAL | Status: DC

## 2013-06-20 MED ORDER — RIVAROXABAN 10 MG PO TABS: 10.0000 mg | ORAL_TABLET | Freq: Every day | ORAL | Status: DC

## 2013-06-20 MED ORDER — ACETAMINOPHEN 325 MG PO TABS: 650.0000 mg | ORAL_TABLET | Freq: Four times a day (QID) | ORAL | Status: DC | PRN

## 2013-06-20 NOTE — Evaluation (Signed)
Occupational Therapy Evaluation and Discharge Patient Details Name: Darlene [name] MRN: [id number] DOB: Mar 04, 1935 Today's Date: 06/20/2013 Time: 6063-0160 OT Time Calculation (min): 24 min  OT Assessment / Plan / Recommendation History of present illness 79 y.o. female admitted to [facility] on 06/19/13 for elective R TKA.    Clinical Impression   This 79 yo female admitted and underwent above presents to acute OT with all acute OT education completed and I am recommending follow up [place]. Acute OT will sign of due to pt D/'C'ing today.   OT Assessment  Patient needs continued OT Services    Follow Up Recommendations  Home health OT       Equipment Recommendations  3 in 1 bedside comode          Precautions / Restrictions Precautions Precautions: Knee;Fall Knee Immobilizer - Right: Other (comment) (PA got pt up without KI. ) Restrictions RLE Weight Bearing: Weight bearing as tolerated   Pertinent Vitals/Pain 3/10 LLE; RN made aware    ADL  Eating/Feeding: Independent Where Assessed - Eating/Feeding: Chair Grooming: Min guard Where Assessed - Grooming: Unsupported standing Upper Body Bathing: Set up Where Assessed - Upper Body Bathing: Unsupported sitting Lower Body Bathing: Moderate assistance Where Assessed - Lower Body Bathing: Unsupported sit to stand Upper Body Dressing: Set up Where Assessed - Upper Body Dressing: Unsupported sitting Lower Body Dressing: Moderate assistance Where Assessed - Lower Body Dressing: Unsupported sit to stand Toilet Transfer: Min Psychiatric nurse Method: Sit to Loss adjuster, chartered: Comfort height toilet;Grab bars Toileting - Water quality scientist and Hygiene: Min guard Where Assessed - Best boy and Hygiene: Sit to stand from 3-in-1 or toilet Tub/Shower Transfer: Minimal assistance Tub/Shower Transfer Method: Therapist, art:  (3n1 in tub (2 legs in and 2 legs out)) Equipment  Used: Gait belt;Rolling walker Transfers/Ambulation Related to ADLs: min guard A with RW ADL Comments: caregivers will A with LBADLs until pt can do them for herself    OT Diagnosis: Generalized weakness;Acute pain  OT Problem List: Decreased strength;Decreased range of motion;Pain;Impaired balance (sitting and/or standing);Decreased knowledge of use of DME or AE  Acute Rehab OT Goals Patient Stated Goal: Home today  Visit Information  Last OT Received On: 06/20/13 Assistance Needed: +1 History of Present Illness: 79 y.o. female admitted to [facility] on 06/19/13 for elective R TKA.        Prior [place] expects to be discharged to:: Private residence Living Arrangements: Spouse/significant other Available Help at Discharge: Available 24 hours/day;Personal care attendant Type of Home: House Home Access: Stairs to enter Technical brewer of Steps: 1 Entrance Stairs-Rails: None Home Layout: One level Home Equipment: Environmental consultant - 2 wheels Additional Comments: [facility] will be providing 24/7 assist for 2 weeks Prior Function Level of Independence: Independent Communication Communication: No difficulties Dominant Hand: Right         Vision/Perception Vision - History Patient Visual Report: No change from baseline   Cognition  Cognition Arousal/Alertness: Awake/alert Behavior During Therapy: WFL for tasks assessed/performed Overall Cognitive Status:  (having trouble following my instructions--possibly to due to pain meds)    Extremity/Trunk Assessment Upper Extremity Assessment Upper Extremity Assessment: Overall WFL for tasks assessed     Mobility Bed Mobility General bed mobility comments: Pt up on toilet upon my arrival Transfers Overall transfer level: Needs assistance Equipment used: Rolling walker (2 wheeled) Transfers: Sit to/from Stand Sit to Stand: Min guard Stand pivot transfers: Min guard General  transfer comment:  min guard assist for safety when going to sit on elevated toilet seat.  Verbal cues for safe technique and safe hand placement.         Balance Balance Overall balance assessment: Needs assistance Standing balance support: Bilateral upper extremity supported Standing balance-Leahy Scale: Fair   End of Session OT - End of Session Equipment Utilized During Treatment: Gait belt;Rolling walker Activity Tolerance: Patient tolerated treatment well Patient left: in chair;with call bell/phone within reach Nurse Communication: Patient requests pain meds    Almon Register [phone] 06/20/2013, 11:52 AM

## 2013-06-20 NOTE — Care Management Note (Signed)
CARE MANAGEMENT NOTE 06/20/2013  Patient:  MELBA, ARAKI   Account Number:  [id number]  Date Initiated:  06/20/2013  Documentation initiated by:  Ricki Miller  Subjective/Objective Assessment:   79 yr old female s/p right total knee arthroplasty.     Action/Plan:   Case manager spoke with patient concerning home health and DME needs. Patient preoperatively setup with [facility], who are also servicing her husband. no changes.  Patient will go home via EMS.   Anticipated DC Date:  06/20/2013   Anticipated DC Plan:  [place]  In-house referral  Clinical Social Worker      DC Forensic scientist  CM consult      [facility] Choice  HOME HEALTH  DURABLE MEDICAL EQUIPMENT   Choice offered to / List presented to:  C-1 Patient   DME arranged  3-N-1  CPM      DME agency  [employer]     [place] arranged  HH-2 PT      [place].   Status of service:  Completed, signed off Medicare Important Message given?   (If response is "NO", the following Medicare IM given date fields will be blank) Date Medicare IM given:   Date Additional Medicare IM given:    Discharge Disposition:  [place]

## 2013-06-20 NOTE — Progress Notes (Signed)
Physical Therapy Treatment Patient Details Name: KRYSTINA STRIETER MRN: [id number] DOB: 08-Apr-1935 Today's Date: 06/20/2013 Time: 6761-9509 PT Time Calculation (min): 24 min  PT Assessment / Plan / Recommendation  History of Present Illness 79 y.o. female admitted to [facility] on 06/19/13 for elective R TKA.    PT Comments   Pt is POD #1 and is moving well with min guard assist and RW.  She is due to d/c home this AM with [facility] providing 24/7 care for two weeks at discharge.  She is appropriate for HHPT f/u at discharge.    Follow Up Recommendations  Home health PT;Supervision for mobility/OOB     Does the patient have the potential to tolerate intense rehabilitation    NA  Barriers to Discharge   None- pt is being transported home via ambulance.        Equipment Recommendations  None recommended by PT    Recommendations for Other Services   NA  Frequency 7X/week   Progress towards PT Goals Progress towards PT goals: Progressing toward goals  Plan Current plan remains appropriate    Precautions / Restrictions Precautions Precautions: Knee;Fall Knee Immobilizer - Right: Other (comment) (PA got pt up without KI. ) Restrictions Weight Bearing Restrictions: Yes RLE Weight Bearing: Weight bearing as tolerated   Pertinent Vitals/Pain See vitals flow sheet.    Mobility  Transfers Transfers: Sit to/from Stand Sit to Stand: Min guard General transfer comment: min guard assist for safety when going to sit on elevated toilet seat.  Verbal cues for safe technique and safe hand placement.  Ambulation/Gait Ambulation/Gait assistance: Min guard Ambulation Distance (Feet): 120 Feet Assistive device: Rolling walker (2 wheeled) Gait Pattern/deviations: Step-to pattern;Decreased weight shift to right;Antalgic Gait velocity: decreased Gait velocity interpretation: Below normal speed for age/gender General Gait Details: Verbal cues for leg sequencing and safe use of RW as well as  upright posture. Stairs: Yes Stairs assistance: Min guard Stair Management: Step to pattern;Forwards;Two rails Number of Stairs: 5 General stair comments: Verbal cues for safest LE sequence.  Pt has a ramp to enter her home.       PT Goals (current goals can now be found in the care plan section) Acute Rehab PT Goals Patient Stated Goal: home  Visit Information  Last PT Received On: 06/20/13 Assistance Needed: +1 History of Present Illness: 79 y.o. female admitted to [facility] on 06/19/13 for elective R TKA.     Subjective Data  Subjective: Pt reports that she has [facility]" (home sitter company) at home primarily for the care of her husband, but she is now going to hire round the clock care for her as well.   Patient Stated Goal: home   Cognition  Cognition Arousal/Alertness: Awake/alert Behavior During Therapy: WFL for tasks assessed/performed Overall Cognitive Status: Within Functional Limits for tasks assessed    Balance  Balance Overall balance assessment: Needs assistance Standing balance support: Bilateral upper extremity supported Standing balance-Leahy Scale: Fair  End of Session PT - End of Session Activity Tolerance: Patient limited by fatigue;Patient limited by pain Patient left: Other (comment) (with OT seated on toilet in bathroom) CPM Right Knee CPM Right Knee: Off     Oris Staffieri B. [place], [place], DPT [phone]   06/20/2013, 10:31 AM

## 2013-06-20 NOTE — Progress Notes (Signed)
Patient discharged to home. Discharge instructions and rx given and explained and patient stated understanding. IV was removed and patient left unit in a stable condition via EMS.

## 2013-06-20 NOTE — Discharge Instructions (Signed)
Home Health physical therapy to be provided by [facility] [phone]

## 2013-06-20 NOTE — Discharge Summary (Signed)
Patient ID: Darlene [name] MRN: [id number] DOB/AGE: 1935/03/06 79 y.o.  Admit date: 06/19/2013 Discharge date: 06/20/2013  Admission Diagnoses:  Principal Problem:   Right knee DJD Active Problems:   Breast cancer, stage 1, estrogen receptor negative   Lymphoma of lymph nodes of head, face, and/or neck   Hypothyroid   Chronic ITP (idiopathic thrombocytopenia)   Unstable angina   Osteoporosis, unspecified   Dyslipidemia   DJD (degenerative joint disease) of knee   Discharge Diagnoses:  Same  Past Medical History  Diagnosis Date  . Thyroid disease   . Chronic ITP (idiopathic thrombocytopenia) 07/06/2011  . Hyperlipidemia     takes Lovastatin daily  . Right knee DJD   . Cancer of breast   . Lymphoma   . Breast cancer, stage 1, estrogen receptor negative 07/06/2011  . Lymphoma of lymph nodes of head, face, and/or neck 07/06/2011  . Lymphoma     B cell   . Hypothyroid 07/06/2011    takes SYnthroid daily  . Constipation     takes Colace daily  . GERD (gastroesophageal reflux disease)     takes Nexium daily  . History of palpitations     takes Metoprolol daily  . Joint pain   . Joint swelling   . COPD (chronic obstructive pulmonary disease)     mild  . History of vertigo   . Osteoporosis     hx of and take Reclast  . Osteopenia   . History of colon polyps   . Nocturia   . History of depression     received shock treatments     Surgeries: Procedure(s): TOTAL KNEE ARTHROPLASTY on 06/19/2013   Consultants:    Discharged Condition: Improved  Hospital Course: Darlene [name] is an 79 y.o. female who was admitted 06/19/2013 for operative treatment ofRight knee DJD. Patient has severe unremitting pain that affects sleep, daily activities, and work/hobbies. After pre-op clearance the patient was taken to the operating room on 06/19/2013 and underwent  Procedure(s): TOTAL KNEE ARTHROPLASTY.    Patient was given perioperative antibiotics: Anti-infectives   Start      Dose/Rate Route Frequency Ordered Stop   06/19/13 1500  ceFAZolin (ANCEF) IVPB 2 g/50 mL premix     2 g 100 mL/hr over 30 Minutes Intravenous Every 6 hours 06/19/13 1314 06/19/13 2226   06/19/13 1338  famciclovir (FAMVIR) tablet 250 mg     250 mg Oral 2 times daily PRN 06/19/13 1340     06/19/13 1314  famciclovir (FAMVIR) tablet 250 mg  Status:  Discontinued     250 mg Oral 2 times daily PRN 06/19/13 1314 06/19/13 1338   06/19/13 0600  ceFAZolin (ANCEF) IVPB 2 g/50 mL premix     2 g 100 mL/hr over 30 Minutes Intravenous On call to O.R. 06/18/13 1437 06/19/13 0900       Patient was given sequential compression devices, early ambulation, and chemoprophylaxis to prevent DVT.  Patient benefited maximally from hospital stay and there were no complications.  Upon discharge there was 2 inches of snow and ice outside.  The patient only had a rear wheel drive car for discharge.  For the safety of the patient, it is medically necessary for her to be transported home in a non emergent ambulance.  This was arranged by social work.  Discharge medications were filled prior to discharge.  Recent vital signs: Patient Vitals for the past 24 hrs:  BP Temp Pulse Resp SpO2 Height Weight  06/20/13 0800 - - -  18 97 % - -  06/20/13 0539 128/54 mmHg 98.3 F (36.8 C) 67 16 97 % - -  06/20/13 0037 112/52 mmHg 97.6 F (36.4 C) 76 16 100 % - -  06/19/13 2100 101/48 mmHg 97.9 F (36.6 C) 74 16 98 % - -  06/19/13 1300 - - - - - 5\' 2"  (1.575 m) 66.724 kg (147 lb 1.6 oz)  06/19/13 1230 133/66 mmHg 98.2 F (36.8 C) 85 11 95 % - -  06/19/13 1215 133/71 mmHg - 85 16 93 % - -  06/19/13 1200 121/64 mmHg - 86 15 93 % - -  06/19/13 1145 143/78 mmHg - 87 16 92 % - -  06/19/13 1130 147/72 mmHg - 87 16 91 % - -  06/19/13 1115 156/79 mmHg - 94 17 95 % - -  06/19/13 1105 176/94 mmHg 97.7 F (36.5 C) 96 13 97 % - -     Recent laboratory studies:  Recent Labs  06/20/13 0335  WBC 10.5  HGB 9.4*  HCT 28.4*  PLT 179   NA 137  K 4.4  CL 104  CO2 22  BUN 17  CREATININE 0.55  GLUCOSE 170*  CALCIUM 8.8     Discharge Medications:     Medication List    STOP taking these medications       aspirin EC 81 MG tablet     traMADol 50 MG tablet  Commonly known as:  ULTRAM      TAKE these medications       acetaminophen 325 MG tablet  Commonly known as:  TYLENOL  Take 2 tablets (650 mg total) by mouth every 6 (six) hours as needed for mild pain (or Fever >/= 101).     bisacodyl 5 MG EC tablet  Commonly known as:  DULCOLAX  Take 2 tablets every night with dinner until bowel movement.  LAXITIVE.  Restart if two days since last bowel movement     CALCIUM 600+D 600-200 MG-UNIT Tabs  Generic drug:  Calcium Carbonate-Vitamin D  Take 1 tablet by mouth 2 (two) times daily.     citalopram 10 MG tablet  Commonly known as:  CELEXA  Take 20 mg by mouth daily.     docusate sodium 100 MG capsule  Commonly known as:  COLACE  Take 100 mg by mouth 2 (two) times daily.     esomeprazole 40 MG capsule  Commonly known as:  NEXIUM  Take 40 mg by mouth daily with supper.     famciclovir 125 MG tablet  Commonly known as:  FAMVIR  Take 250 mg by mouth 2 (two) times daily as needed. For 5 days as needed for outbreak     levothyroxine 75 MCG tablet  Commonly known as:  SYNTHROID, LEVOTHROID  Take 75 mcg by mouth daily.     lovastatin 20 MG tablet  Commonly known as:  MEVACOR  Take 20 mg by mouth at bedtime.     metoprolol succinate 25 MG 24 hr tablet  Commonly known as:  TOPROL-XL  Take 25 mg by mouth daily.     mometasone 0.1 % cream  Commonly known as:  ELOCON  Apply 1 application topically daily as needed (for itching).     multivitamins ther. w/minerals Tabs tablet  Take 1 tablet by mouth daily.     oxyCODONE 5 MG immediate release tablet  Commonly known as:  Oxy IR/ROXICODONE  1-2 tablets every 4-6 hrs as needed for pain  rivaroxaban 10 MG Tabs tablet  Commonly known as:  XARELTO   Take 1 tablet (10 mg total) by mouth daily with breakfast.     SYSTANE 0.4-0.3 % Soln  Generic drug:  Polyethyl Glycol-Propyl Glycol  Apply 2 drops to eye 2 (two) times daily as needed (for dry eyes).     VITAMIN B-12 PO  Take 1,000 mcg by mouth daily.     Vitamin D (Ergocalciferol) 50000 UNITS Caps capsule  Commonly known as:  DRISDOL  Take 50,000 Units by mouth every 14 (fourteen) days.        Diagnostic Studies: No results found.  Disposition: 01-Home or Self Care      Discharge Orders   Future Appointments Provider Department Dept Phone   07/11/2013 10:30 AM [facility] [phone]   07/11/2013 11:00 AM Annia Belt, MD [facility] [phone]   10/02/2013 11:15 AM [facility]  IMAGING [phone]   Please wear two piece clothing and wear no powder or deodorant. Please arrive 15 minutes early prior to your appointment time.   Future Orders Complete By Expires   Call MD / Call 911  As directed    Comments:     If you experience chest pain or shortness of breath, CALL 911 and be transported to the hospital emergency room.  If you develope a fever above 101 F, pus (white drainage) or increased drainage or redness at the wound, or calf pain, call your surgeon's office.   Change dressing  As directed    Comments:     Change the dressing daily with sterile 4 x 4 inch gauze dressing and apply TED hose.  You may clean the incision with alcohol prior to redressing.   Constipation Prevention  As directed    Comments:     Drink plenty of fluids.  Prune juice may be helpful.  You may use a stool softener, such as Colace (over the counter) 100 mg twice a day.  Use MiraLax (over the counter) for constipation as needed.   CPM  As directed    Comments:     Continuous passive motion machine (CPM):      Use the CPM from 0 to 90 for 6 hours per day.       You may break  it up into 2 or 3 sessions per day.      Use CPM for 2 weeks or until you are told to stop.   Diet - low sodium heart healthy  As directed    Discharge instructions  As directed    Comments:     Total Knee Replacement Care After Refer to this sheet in the next few weeks. These discharge instructions provide you with general information on caring for yourself after you leave the hospital. Your caregiver may also give you specific instructions. Your treatment has been planned according to the most current medical practices available, but unavoidable complications sometimes occur. If you have any problems or questions after discharge, please call your caregiver. Regaining a near full range of motion of your knee within the first 3 to 6 weeks after surgery is critical. [place] may resume a normal diet and activities as directed.  Perform exercises as directed.  Place gray foam block, curve side up under heel at all times except when in CPM or when walking.  DO NOT modify, tear, cut, or  change in any way the gray foam block. You will receive physical therapy daily  Take showers instead of baths until informed otherwise.  You may shower on Sunday.  Please wash whole leg including wound with soap and water  Change bandages (dressings)daily It is OK to take over-the-counter tylenol in addition to the oxycodone for pain, discomfort, or fever. Oxycodone is VERY constipating.  Please take stool softener twice a day and laxatives daily until bowels are regular Eat a well-balanced diet.  Avoid lifting or driving until you are instructed otherwise.  Make an appointment to see your caregiver for stitches (suture) or staple removal as directed.  If you have been sent home with a continuous passive motion machine (CPM machine), 0-90 degrees 6 hrs a day   2 hrs a shift SEEK MEDICAL CARE IF: You have swelling of your calf or leg.  You develop shortness of breath or chest pain.  You have  redness, swelling, or increasing pain in the wound.  There is pus or any unusual drainage coming from the surgical site.  You notice a bad smell coming from the surgical site or dressing.  The surgical site breaks open after sutures or staples have been removed.  There is persistent bleeding from the suture or staple line.  You are getting worse or are not improving.  You have any other questions or concerns.  SEEK IMMEDIATE MEDICAL CARE IF:  You have a fever.  You develop a rash.  You have difficulty breathing.  You develop any reaction or side effects to medicines given.  Your knee motion is decreasing rather than improving.  MAKE SURE YOU:  Understand these instructions.  Will watch your condition.  Will get help right away if you are not doing well or get worse.   Do not put a pillow under the knee. Place it under the heel.  As directed    Comments:     Place yellow foam block, yellow side up under heel at all times except when in CPM or when walking.  DO NOT modify, tear, cut, or change in any way the yellow foam block.   Increase activity slowly as tolerated  As directed    TED hose  As directed    Comments:     Use stockings (TED hose) for 2 weeks on both leg(s).  You may remove them at night for sleeping.      Follow-up Information   Follow up with Lorn Junes, MD On 07/04/2013. (appt time 11 am , For wound re-check)    Specialty:  Orthopedic Surgery   Contact information:   [address] [place] [address] [phone]        Signed: Linda Hedges 06/20/2013, 9:43 AM

## 2013-06-21 DIAGNOSIS — IMO0001 Reserved for inherently not codable concepts without codable children: Secondary | ICD-10-CM | POA: Diagnosis not present

## 2013-06-21 DIAGNOSIS — Z471 Aftercare following joint replacement surgery: Secondary | ICD-10-CM | POA: Diagnosis not present

## 2013-06-21 DIAGNOSIS — Z7901 Long term (current) use of anticoagulants: Secondary | ICD-10-CM | POA: Diagnosis not present

## 2013-06-21 DIAGNOSIS — Z96659 Presence of unspecified artificial knee joint: Secondary | ICD-10-CM | POA: Diagnosis not present

## 2013-06-22 DIAGNOSIS — Z7901 Long term (current) use of anticoagulants: Secondary | ICD-10-CM | POA: Diagnosis not present

## 2013-06-22 DIAGNOSIS — Z471 Aftercare following joint replacement surgery: Secondary | ICD-10-CM | POA: Diagnosis not present

## 2013-06-22 DIAGNOSIS — IMO0001 Reserved for inherently not codable concepts without codable children: Secondary | ICD-10-CM | POA: Diagnosis not present

## 2013-06-22 DIAGNOSIS — Z96659 Presence of unspecified artificial knee joint: Secondary | ICD-10-CM | POA: Diagnosis not present

## 2013-06-23 ENCOUNTER — Encounter (HOSPITAL_COMMUNITY): Payer: Self-pay | Admitting: Orthopedic Surgery

## 2013-06-23 DIAGNOSIS — Z96659 Presence of unspecified artificial knee joint: Secondary | ICD-10-CM | POA: Diagnosis not present

## 2013-06-23 DIAGNOSIS — Z7901 Long term (current) use of anticoagulants: Secondary | ICD-10-CM | POA: Diagnosis not present

## 2013-06-23 DIAGNOSIS — IMO0001 Reserved for inherently not codable concepts without codable children: Secondary | ICD-10-CM | POA: Diagnosis not present

## 2013-06-23 DIAGNOSIS — Z471 Aftercare following joint replacement surgery: Secondary | ICD-10-CM | POA: Diagnosis not present

## 2013-06-26 DIAGNOSIS — Z471 Aftercare following joint replacement surgery: Secondary | ICD-10-CM | POA: Diagnosis not present

## 2013-06-26 DIAGNOSIS — IMO0001 Reserved for inherently not codable concepts without codable children: Secondary | ICD-10-CM | POA: Diagnosis not present

## 2013-06-26 DIAGNOSIS — Z96659 Presence of unspecified artificial knee joint: Secondary | ICD-10-CM | POA: Diagnosis not present

## 2013-06-26 DIAGNOSIS — Z7901 Long term (current) use of anticoagulants: Secondary | ICD-10-CM | POA: Diagnosis not present

## 2013-06-27 DIAGNOSIS — Z96659 Presence of unspecified artificial knee joint: Secondary | ICD-10-CM | POA: Diagnosis not present

## 2013-06-27 DIAGNOSIS — Z7901 Long term (current) use of anticoagulants: Secondary | ICD-10-CM | POA: Diagnosis not present

## 2013-06-27 DIAGNOSIS — IMO0001 Reserved for inherently not codable concepts without codable children: Secondary | ICD-10-CM | POA: Diagnosis not present

## 2013-06-27 DIAGNOSIS — Z471 Aftercare following joint replacement surgery: Secondary | ICD-10-CM | POA: Diagnosis not present

## 2013-06-28 DIAGNOSIS — Z7901 Long term (current) use of anticoagulants: Secondary | ICD-10-CM | POA: Diagnosis not present

## 2013-06-28 DIAGNOSIS — Z471 Aftercare following joint replacement surgery: Secondary | ICD-10-CM | POA: Diagnosis not present

## 2013-06-28 DIAGNOSIS — IMO0001 Reserved for inherently not codable concepts without codable children: Secondary | ICD-10-CM | POA: Diagnosis not present

## 2013-06-28 DIAGNOSIS — Z96659 Presence of unspecified artificial knee joint: Secondary | ICD-10-CM | POA: Diagnosis not present

## 2013-06-29 DIAGNOSIS — IMO0001 Reserved for inherently not codable concepts without codable children: Secondary | ICD-10-CM | POA: Diagnosis not present

## 2013-06-29 DIAGNOSIS — Z96659 Presence of unspecified artificial knee joint: Secondary | ICD-10-CM | POA: Diagnosis not present

## 2013-06-29 DIAGNOSIS — Z7901 Long term (current) use of anticoagulants: Secondary | ICD-10-CM | POA: Diagnosis not present

## 2013-06-29 DIAGNOSIS — Z471 Aftercare following joint replacement surgery: Secondary | ICD-10-CM | POA: Diagnosis not present

## 2013-07-01 ENCOUNTER — Encounter: Payer: Self-pay | Admitting: Oncology

## 2013-07-03 DIAGNOSIS — Z471 Aftercare following joint replacement surgery: Secondary | ICD-10-CM | POA: Diagnosis not present

## 2013-07-03 DIAGNOSIS — IMO0001 Reserved for inherently not codable concepts without codable children: Secondary | ICD-10-CM | POA: Diagnosis not present

## 2013-07-03 DIAGNOSIS — Z96659 Presence of unspecified artificial knee joint: Secondary | ICD-10-CM | POA: Diagnosis not present

## 2013-07-03 DIAGNOSIS — Z7901 Long term (current) use of anticoagulants: Secondary | ICD-10-CM | POA: Diagnosis not present

## 2013-07-04 DIAGNOSIS — Z96659 Presence of unspecified artificial knee joint: Secondary | ICD-10-CM | POA: Diagnosis not present

## 2013-07-04 DIAGNOSIS — Z471 Aftercare following joint replacement surgery: Secondary | ICD-10-CM | POA: Diagnosis not present

## 2013-07-05 DIAGNOSIS — Z471 Aftercare following joint replacement surgery: Secondary | ICD-10-CM | POA: Diagnosis not present

## 2013-07-05 DIAGNOSIS — Z7901 Long term (current) use of anticoagulants: Secondary | ICD-10-CM | POA: Diagnosis not present

## 2013-07-05 DIAGNOSIS — IMO0001 Reserved for inherently not codable concepts without codable children: Secondary | ICD-10-CM | POA: Diagnosis not present

## 2013-07-05 DIAGNOSIS — Z96659 Presence of unspecified artificial knee joint: Secondary | ICD-10-CM | POA: Diagnosis not present

## 2013-07-06 DIAGNOSIS — F430 Acute stress reaction: Secondary | ICD-10-CM | POA: Diagnosis not present

## 2013-07-06 DIAGNOSIS — F341 Dysthymic disorder: Secondary | ICD-10-CM | POA: Diagnosis not present

## 2013-07-07 DIAGNOSIS — Z7901 Long term (current) use of anticoagulants: Secondary | ICD-10-CM | POA: Diagnosis not present

## 2013-07-07 DIAGNOSIS — Z471 Aftercare following joint replacement surgery: Secondary | ICD-10-CM | POA: Diagnosis not present

## 2013-07-07 DIAGNOSIS — Z96659 Presence of unspecified artificial knee joint: Secondary | ICD-10-CM | POA: Diagnosis not present

## 2013-07-07 DIAGNOSIS — IMO0001 Reserved for inherently not codable concepts without codable children: Secondary | ICD-10-CM | POA: Diagnosis not present

## 2013-07-11 ENCOUNTER — Ambulatory Visit (HOSPITAL_BASED_OUTPATIENT_CLINIC_OR_DEPARTMENT_OTHER): Payer: Medicare Other | Admitting: Oncology

## 2013-07-11 ENCOUNTER — Other Ambulatory Visit: Payer: Self-pay | Admitting: Oncology

## 2013-07-11 ENCOUNTER — Telehealth: Payer: Self-pay | Admitting: Oncology

## 2013-07-11 ENCOUNTER — Other Ambulatory Visit (HOSPITAL_BASED_OUTPATIENT_CLINIC_OR_DEPARTMENT_OTHER): Payer: Medicare Other

## 2013-07-11 VITALS — BP 134/58 | HR 73 | Temp 97.9°F | Resp 18 | Ht 62.0 in | Wt 146.2 lb

## 2013-07-11 DIAGNOSIS — Z171 Estrogen receptor negative status [ER-]: Principal | ICD-10-CM

## 2013-07-11 DIAGNOSIS — Z8501 Personal history of malignant neoplasm of esophagus: Secondary | ICD-10-CM | POA: Diagnosis not present

## 2013-07-11 DIAGNOSIS — Z87898 Personal history of other specified conditions: Secondary | ICD-10-CM

## 2013-07-11 DIAGNOSIS — F329 Major depressive disorder, single episode, unspecified: Secondary | ICD-10-CM

## 2013-07-11 DIAGNOSIS — C8591 Non-Hodgkin lymphoma, unspecified, lymph nodes of head, face, and neck: Secondary | ICD-10-CM

## 2013-07-11 DIAGNOSIS — D693 Immune thrombocytopenic purpura: Secondary | ICD-10-CM

## 2013-07-11 DIAGNOSIS — C50919 Malignant neoplasm of unspecified site of unspecified female breast: Secondary | ICD-10-CM

## 2013-07-11 DIAGNOSIS — M6281 Muscle weakness (generalized): Secondary | ICD-10-CM | POA: Diagnosis not present

## 2013-07-11 DIAGNOSIS — F3289 Other specified depressive episodes: Secondary | ICD-10-CM

## 2013-07-11 DIAGNOSIS — M25669 Stiffness of unspecified knee, not elsewhere classified: Secondary | ICD-10-CM | POA: Diagnosis not present

## 2013-07-11 DIAGNOSIS — C50911 Malignant neoplasm of unspecified site of right female breast: Secondary | ICD-10-CM

## 2013-07-11 DIAGNOSIS — R262 Difficulty in walking, not elsewhere classified: Secondary | ICD-10-CM | POA: Diagnosis not present

## 2013-07-11 DIAGNOSIS — M810 Age-related osteoporosis without current pathological fracture: Secondary | ICD-10-CM

## 2013-07-11 LAB — COMPREHENSIVE METABOLIC PANEL (CC13)
ALBUMIN: 3.5 g/dL (ref 3.5–5.0)
ALK PHOS: 84 U/L (ref 40–150)
ALT: 13 U/L (ref 0–55)
AST: 12 U/L (ref 5–34)
Anion Gap: 9 mEq/L (ref 3–11)
BUN: 8.7 mg/dL (ref 7.0–26.0)
CO2: 25 meq/L (ref 22–29)
Calcium: 10.2 mg/dL (ref 8.4–10.4)
Chloride: 103 mEq/L (ref 98–109)
Creatinine: 0.7 mg/dL (ref 0.6–1.1)
GLUCOSE: 134 mg/dL (ref 70–140)
POTASSIUM: 4.2 meq/L (ref 3.5–5.1)
SODIUM: 137 meq/L (ref 136–145)
TOTAL PROTEIN: 6.7 g/dL (ref 6.4–8.3)
Total Bilirubin: 0.62 mg/dL (ref 0.20–1.20)

## 2013-07-11 LAB — CBC WITH DIFFERENTIAL/PLATELET
BASO%: 0.8 % (ref 0.0–2.0)
Basophils Absolute: 0.1 10*3/uL (ref 0.0–0.1)
EOS%: 2 % (ref 0.0–7.0)
Eosinophils Absolute: 0.1 10*3/uL (ref 0.0–0.5)
HCT: 31.1 % — ABNORMAL LOW (ref 34.8–46.6)
HEMOGLOBIN: 10.1 g/dL — AB (ref 11.6–15.9)
LYMPH#: 1.2 10*3/uL (ref 0.9–3.3)
LYMPH%: 17.9 % (ref 14.0–49.7)
MCH: 28.5 pg (ref 25.1–34.0)
MCHC: 32.4 g/dL (ref 31.5–36.0)
MCV: 87.8 fL (ref 79.5–101.0)
MONO#: 0.4 10*3/uL (ref 0.1–0.9)
MONO%: 5.9 % (ref 0.0–14.0)
NEUT#: 5.1 10*3/uL (ref 1.5–6.5)
NEUT%: 73.4 % (ref 38.4–76.8)
Platelets: 315 10*3/uL (ref 145–400)
RBC: 3.55 10*6/uL — ABNORMAL LOW (ref 3.70–5.45)
RDW: 14.3 % (ref 11.2–14.5)
WBC: 6.9 10*3/uL (ref 3.9–10.3)

## 2013-07-11 LAB — MORPHOLOGY: PLT EST: ADEQUATE

## 2013-07-11 LAB — LACTATE DEHYDROGENASE (CC13): LDH: 174 U/L (ref 125–245)

## 2013-07-11 NOTE — Telephone Encounter (Signed)
gv pt appt schedule for march 2016 and mammo/us for 10/04/13. per 3/10 pof transfer to [facility].

## 2013-07-11 NOTE — Progress Notes (Signed)
Hematology and Oncology Follow Up Visit  Darlene [name] [id number] Jan 19, 1935 79 y.o. 07/11/2013 1:32 PM   Principle Diagnosis: Encounter Diagnoses  Name Primary?  . Breast cancer, stage 1, estrogen receptor negative Yes  . Lymphoma of lymph nodes of head, face, and/or neck   . Chronic ITP (idiopathic thrombocytopenia)   . Osteoporosis, unspecified      Interim History:   Followup visit for this 79 year old woman with a remote history of limited stage low grade B-cell non-Hodgkin's lymphoma, presenting with left supraclavicular lymphadenopathy in April 1999. She never required any systemic treatment. 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 This has also resolved on its own. Platelet count when she first visited me in June 2006 was 78,000, by March 2008 platelet count was up to 167,000 and remains normal since that time. She has never developed any additional adenopathy other than the single lymph node palpable in the left supraclavicular fossa.  She did develop a second primary stage I,  0.8 cm,  ER negative cancer of the right breast, treated with lumpectomy, radiation and six cycles of CMF chemotherapy in 1994. No evidence of a new disease from the breast cancer either. Most recent mammogram done 09/29/2012  showed stable fibroglandular changes in her breasts. She was called back for an   ultrasound of the left breast on 10/14/2012 to reevaluate a cluster of cysts which appear benign but a 6 month interval followup was recommended.  She just had a right total knee replacement by Dr. Elsie Saas 3 weeks ago.  She denies any headache, no bone pain except related to her recent surgery, no change in bowel habit, no vaginal bleeding.  She has developed significant depression and is being medicated and counseled. Her husband's Parkinson's disease has gotten much worse. He is now homebound. He is 60 years old. Her daughter who is in her  70s was in a motor vehicle accident and had to have extensive surgery on one of her knees. All of this is putting her under a significant amount of stress.    Medications: reviewed  Allergies:  Allergies  Allergen Reactions  . Oxycodone Other (See Comments)    Made her depressed  . Codeine Itching           . Doxycycline Swelling and Other (See Comments)    Burns throat also   . Meperidine And Related Itching and Rash    Makes cheeks red also  . Sulfa Antibiotics Itching    Review of Systems: Hematology:  No swollen glands. No bleeding or bruising ENT ROS: No sore throat Breast ROS: No new breast lumps Respiratory ROS: No cough or dyspnea  Cardiovascular ROS: No chest pain or palpitations   Gastrointestinal ROS:  No change in bowel habit  Genito-Urinary ROS: No urinary tract symptoms. No vaginal bleeding Musculoskeletal ROS: See above Neurological ROS: No headache or change in vision Dermatological ROS: Rash or ecchymosis Remaining ROS negative:   Physical Exam: Blood pressure 134/58, pulse 73, temperature 97.9 F (36.6 C), temperature source Oral, resp. rate 18, height 5\' 2"  (1.575 m), weight 146 lb 3.2 oz (66.316 kg), SpO2 98.00%. Wt Readings from Last 3 Encounters:  07/11/13 146 lb 3.2 oz (66.316 kg)  06/19/13 147 lb 1.6 oz (66.724 kg)  06/19/13 147 lb 1.6 oz (66.724 kg)     General appearance: Petite Caucasian woman HENNT: Pharynx no erythema, exudate, mass, or ulcer. No thyromegaly or thyroid nodules Lymph nodes: No  cervical, supraclavicular, or axillary lymphadenopathy. I was unable to palpate the left supraclavicular lymph node on my exam today. Breasts: Surgical changes right breast with additional scar over the right parasternal area no dominant mass in either breast. Lungs: Clear to auscultation, resonant to percussion throughout Heart: Regular rhythm, no murmur, no gallop, no rub, no click, no edema Abdomen: Soft, nontender, normal bowel sounds, no mass,  no organomegaly Extremities: No edema, no calf tenderness Musculoskeletal: no joint deformities GU:  Vascular: Carotid pulses 2+, no bruits,  Neurologic: Alert, oriented, PERRLA,   cranial nerves grossly normal, motor strength 5 over 5, reflexes 1+ symmetric, upper body coordination normal, gait normal, Skin: No rash or ecchymosis  Lab Results: CBC W/Diff    Component Value Date/Time   WBC 6.9 07/11/2013 0956   WBC 10.5 06/20/2013 0335   RBC 3.55* 07/11/2013 0956   RBC 3.24* 06/20/2013 0335   HGB 10.1* 07/11/2013 0956   HGB 9.4* 06/20/2013 0335   HCT 31.1* 07/11/2013 0956   HCT 28.4* 06/20/2013 0335   PLT 315 07/11/2013 0956   PLT 179 06/20/2013 0335   MCV 87.8 07/11/2013 0956   MCV 87.7 06/20/2013 0335   MCH 28.5 07/11/2013 0956   MCH 29.0 06/20/2013 0335   MCHC 32.4 07/11/2013 0956   MCHC 33.1 06/20/2013 0335   RDW 14.3 07/11/2013 0956   RDW 14.0 06/20/2013 0335   LYMPHSABS 1.2 07/11/2013 0956   LYMPHSABS 1.5 06/09/2013 1156   MONOABS 0.4 07/11/2013 0956   MONOABS 0.6 06/09/2013 1156   EOSABS 0.1 07/11/2013 0956   EOSABS 0.1 06/09/2013 1156   BASOSABS 0.1 07/11/2013 0956   BASOSABS 0.0 06/09/2013 1156     Chemistry      Component Value Date/Time   NA 137 07/11/2013 0956   NA 137 06/20/2013 0335   K 4.2 07/11/2013 0956   K 4.4 06/20/2013 0335   CL 104 06/20/2013 0335   CL 106 07/11/2012 0906   CO2 25 07/11/2013 0956   CO2 22 06/20/2013 0335   BUN 8.7 07/11/2013 0956   BUN 17 06/20/2013 0335   CREATININE 0.7 07/11/2013 0956   CREATININE 0.55 06/20/2013 0335      Component Value Date/Time   CALCIUM 10.2 07/11/2013 0956   CALCIUM 8.8 06/20/2013 0335   ALKPHOS 84 07/11/2013 0956   ALKPHOS 59 06/09/2013 1156   AST 12 07/11/2013 0956   AST 13 06/09/2013 1156   ALT 13 07/11/2013 0956   ALT 16 06/09/2013 1156   BILITOT 0.62 07/11/2013 0956   BILITOT 0.5 06/09/2013 1156       Radiological Studies: See discussion above   Impression:   #1. Low-grade non-Hodgkin's lymphoma.  Stable disease on no treatment  for many years.  Her index lesion the left supraclavicular lymph node less prominent to me on today's exam, and, in fact, I can no longer palpate it.  No other areas of external adenopathy. She remains asymptomatic.    2. Stage I, ER negative, cancer of the right breast treated as outlined above. No evidence for new disease.   3.. Hypothyroid, on replacement.   4. GERD.   5. Hyperlipidemia.   6. Degenerative arthritis.  Now status post right total knee replacement February 2015  7. Previous moderate thrombocytopenia - resolved spontaneously.  #8. Reactive depression on treatment  I will transition her care to Dr. Humphrey Rolls.    CC: Patient Care Team: Thressa Sheller, MD as PCP - General (Internal Medicine)   Annia Belt, MD 3/10/20151:32 PM  and she was on and in an and he and his

## 2013-07-14 DIAGNOSIS — F341 Dysthymic disorder: Secondary | ICD-10-CM | POA: Diagnosis not present

## 2013-07-19 DIAGNOSIS — R262 Difficulty in walking, not elsewhere classified: Secondary | ICD-10-CM | POA: Diagnosis not present

## 2013-07-19 DIAGNOSIS — M6281 Muscle weakness (generalized): Secondary | ICD-10-CM | POA: Diagnosis not present

## 2013-07-19 DIAGNOSIS — M25669 Stiffness of unspecified knee, not elsewhere classified: Secondary | ICD-10-CM | POA: Diagnosis not present

## 2013-07-21 DIAGNOSIS — R262 Difficulty in walking, not elsewhere classified: Secondary | ICD-10-CM | POA: Diagnosis not present

## 2013-07-21 DIAGNOSIS — M6281 Muscle weakness (generalized): Secondary | ICD-10-CM | POA: Diagnosis not present

## 2013-07-21 DIAGNOSIS — M25669 Stiffness of unspecified knee, not elsewhere classified: Secondary | ICD-10-CM | POA: Diagnosis not present

## 2013-07-25 DIAGNOSIS — Z96659 Presence of unspecified artificial knee joint: Secondary | ICD-10-CM | POA: Diagnosis not present

## 2013-07-25 DIAGNOSIS — Z471 Aftercare following joint replacement surgery: Secondary | ICD-10-CM | POA: Diagnosis not present

## 2013-07-26 DIAGNOSIS — M25669 Stiffness of unspecified knee, not elsewhere classified: Secondary | ICD-10-CM | POA: Diagnosis not present

## 2013-07-26 DIAGNOSIS — M6281 Muscle weakness (generalized): Secondary | ICD-10-CM | POA: Diagnosis not present

## 2013-07-26 DIAGNOSIS — R262 Difficulty in walking, not elsewhere classified: Secondary | ICD-10-CM | POA: Diagnosis not present

## 2013-07-28 DIAGNOSIS — M6281 Muscle weakness (generalized): Secondary | ICD-10-CM | POA: Diagnosis not present

## 2013-07-28 DIAGNOSIS — R262 Difficulty in walking, not elsewhere classified: Secondary | ICD-10-CM | POA: Diagnosis not present

## 2013-07-28 DIAGNOSIS — M25669 Stiffness of unspecified knee, not elsewhere classified: Secondary | ICD-10-CM | POA: Diagnosis not present

## 2013-08-08 DIAGNOSIS — M6281 Muscle weakness (generalized): Secondary | ICD-10-CM | POA: Diagnosis not present

## 2013-08-08 DIAGNOSIS — R262 Difficulty in walking, not elsewhere classified: Secondary | ICD-10-CM | POA: Diagnosis not present

## 2013-08-08 DIAGNOSIS — M25669 Stiffness of unspecified knee, not elsewhere classified: Secondary | ICD-10-CM | POA: Diagnosis not present

## 2013-08-10 ENCOUNTER — Telehealth: Payer: Self-pay | Admitting: Hematology and Oncology

## 2013-08-10 NOTE — Telephone Encounter (Signed)
, °

## 2013-08-11 DIAGNOSIS — R262 Difficulty in walking, not elsewhere classified: Secondary | ICD-10-CM | POA: Diagnosis not present

## 2013-08-11 DIAGNOSIS — M25669 Stiffness of unspecified knee, not elsewhere classified: Secondary | ICD-10-CM | POA: Diagnosis not present

## 2013-08-11 DIAGNOSIS — M6281 Muscle weakness (generalized): Secondary | ICD-10-CM | POA: Diagnosis not present

## 2013-08-14 DIAGNOSIS — M6281 Muscle weakness (generalized): Secondary | ICD-10-CM | POA: Diagnosis not present

## 2013-08-14 DIAGNOSIS — M25669 Stiffness of unspecified knee, not elsewhere classified: Secondary | ICD-10-CM | POA: Diagnosis not present

## 2013-08-14 DIAGNOSIS — R262 Difficulty in walking, not elsewhere classified: Secondary | ICD-10-CM | POA: Diagnosis not present

## 2013-08-17 DIAGNOSIS — M6281 Muscle weakness (generalized): Secondary | ICD-10-CM | POA: Diagnosis not present

## 2013-08-17 DIAGNOSIS — S61209A Unspecified open wound of unspecified finger without damage to nail, initial encounter: Secondary | ICD-10-CM | POA: Diagnosis not present

## 2013-08-17 DIAGNOSIS — M25669 Stiffness of unspecified knee, not elsewhere classified: Secondary | ICD-10-CM | POA: Diagnosis not present

## 2013-08-17 DIAGNOSIS — R262 Difficulty in walking, not elsewhere classified: Secondary | ICD-10-CM | POA: Diagnosis not present

## 2013-08-17 DIAGNOSIS — F329 Major depressive disorder, single episode, unspecified: Secondary | ICD-10-CM | POA: Diagnosis not present

## 2013-08-17 DIAGNOSIS — F3289 Other specified depressive episodes: Secondary | ICD-10-CM | POA: Diagnosis not present

## 2013-08-21 DIAGNOSIS — M25669 Stiffness of unspecified knee, not elsewhere classified: Secondary | ICD-10-CM | POA: Diagnosis not present

## 2013-08-21 DIAGNOSIS — R262 Difficulty in walking, not elsewhere classified: Secondary | ICD-10-CM | POA: Diagnosis not present

## 2013-08-21 DIAGNOSIS — M6281 Muscle weakness (generalized): Secondary | ICD-10-CM | POA: Diagnosis not present

## 2013-08-23 DIAGNOSIS — M25669 Stiffness of unspecified knee, not elsewhere classified: Secondary | ICD-10-CM | POA: Diagnosis not present

## 2013-08-23 DIAGNOSIS — M6281 Muscle weakness (generalized): Secondary | ICD-10-CM | POA: Diagnosis not present

## 2013-08-23 DIAGNOSIS — R262 Difficulty in walking, not elsewhere classified: Secondary | ICD-10-CM | POA: Diagnosis not present

## 2013-08-28 DIAGNOSIS — M6281 Muscle weakness (generalized): Secondary | ICD-10-CM | POA: Diagnosis not present

## 2013-08-28 DIAGNOSIS — Z471 Aftercare following joint replacement surgery: Secondary | ICD-10-CM | POA: Diagnosis not present

## 2013-08-28 DIAGNOSIS — M25669 Stiffness of unspecified knee, not elsewhere classified: Secondary | ICD-10-CM | POA: Diagnosis not present

## 2013-08-28 DIAGNOSIS — Z96659 Presence of unspecified artificial knee joint: Secondary | ICD-10-CM | POA: Diagnosis not present

## 2013-08-28 DIAGNOSIS — R262 Difficulty in walking, not elsewhere classified: Secondary | ICD-10-CM | POA: Diagnosis not present

## 2013-08-30 DIAGNOSIS — Z471 Aftercare following joint replacement surgery: Secondary | ICD-10-CM | POA: Diagnosis not present

## 2013-08-30 DIAGNOSIS — M6281 Muscle weakness (generalized): Secondary | ICD-10-CM | POA: Diagnosis not present

## 2013-08-30 DIAGNOSIS — R262 Difficulty in walking, not elsewhere classified: Secondary | ICD-10-CM | POA: Diagnosis not present

## 2013-08-30 DIAGNOSIS — Z96659 Presence of unspecified artificial knee joint: Secondary | ICD-10-CM | POA: Diagnosis not present

## 2013-08-30 DIAGNOSIS — M25669 Stiffness of unspecified knee, not elsewhere classified: Secondary | ICD-10-CM | POA: Diagnosis not present

## 2013-08-31 ENCOUNTER — Ambulatory Visit (INDEPENDENT_AMBULATORY_CARE_PROVIDER_SITE_OTHER): Payer: Medicare Other | Admitting: Podiatrist

## 2013-08-31 VITALS — Resp 16 | Ht 62.0 in | Wt 145.0 lb

## 2013-08-31 DIAGNOSIS — M79609 Pain in unspecified limb: Secondary | ICD-10-CM | POA: Diagnosis not present

## 2013-08-31 DIAGNOSIS — B351 Tinea unguium: Secondary | ICD-10-CM | POA: Diagnosis not present

## 2013-09-05 NOTE — Progress Notes (Signed)
HPI: Patient presents today for follow up of painful mycotic nails. Patient relates pain with ambulation and in shoes. Past medical history, meds, and allergies reviewed.  Objective: Neurovascular status unchanged with palpable pedal pulses and neurological sensation intact. Patients nails are elongated, thickened, discolored, dystrophic with ingrown deformity present.  Assessment: painful mycotic nails 1-5 bilateral  Plan: Discussed treatment options and alternatives. Debrided nails without complication. Return appointment recommended at routine intervals of 3 months.   

## 2013-09-06 DIAGNOSIS — M25669 Stiffness of unspecified knee, not elsewhere classified: Secondary | ICD-10-CM | POA: Diagnosis not present

## 2013-09-06 DIAGNOSIS — M6281 Muscle weakness (generalized): Secondary | ICD-10-CM | POA: Diagnosis not present

## 2013-09-06 DIAGNOSIS — R262 Difficulty in walking, not elsewhere classified: Secondary | ICD-10-CM | POA: Diagnosis not present

## 2013-09-08 DIAGNOSIS — M25669 Stiffness of unspecified knee, not elsewhere classified: Secondary | ICD-10-CM | POA: Diagnosis not present

## 2013-09-08 DIAGNOSIS — R262 Difficulty in walking, not elsewhere classified: Secondary | ICD-10-CM | POA: Diagnosis not present

## 2013-09-08 DIAGNOSIS — M6281 Muscle weakness (generalized): Secondary | ICD-10-CM | POA: Diagnosis not present

## 2013-10-03 DIAGNOSIS — H6120 Impacted cerumen, unspecified ear: Secondary | ICD-10-CM | POA: Diagnosis not present

## 2013-10-04 ENCOUNTER — Encounter (INDEPENDENT_AMBULATORY_CARE_PROVIDER_SITE_OTHER): Payer: Self-pay

## 2013-10-04 ENCOUNTER — Ambulatory Visit
Admission: RE | Admit: 2013-10-04 | Discharge: 2013-10-04 | Disposition: A | Discharge status: Home / Self Care | Payer: Medicare Other | Source: Ambulatory Visit | Attending: Oncology | Admitting: Oncology

## 2013-10-04 DIAGNOSIS — Z171 Estrogen receptor negative status [ER-]: Principal | ICD-10-CM

## 2013-10-04 DIAGNOSIS — N63 Unspecified lump in unspecified breast: Secondary | ICD-10-CM | POA: Diagnosis not present

## 2013-10-04 DIAGNOSIS — R928 Other abnormal and inconclusive findings on diagnostic imaging of breast: Secondary | ICD-10-CM | POA: Diagnosis not present

## 2013-10-04 DIAGNOSIS — C50919 Malignant neoplasm of unspecified site of unspecified female breast: Secondary | ICD-10-CM

## 2013-10-19 DIAGNOSIS — L905 Scar conditions and fibrosis of skin: Secondary | ICD-10-CM | POA: Diagnosis not present

## 2013-10-19 DIAGNOSIS — L570 Actinic keratosis: Secondary | ICD-10-CM | POA: Diagnosis not present

## 2013-10-19 DIAGNOSIS — L821 Other seborrheic keratosis: Secondary | ICD-10-CM | POA: Diagnosis not present

## 2013-10-19 DIAGNOSIS — Z85828 Personal history of other malignant neoplasm of skin: Secondary | ICD-10-CM | POA: Diagnosis not present

## 2013-10-19 DIAGNOSIS — L723 Sebaceous cyst: Secondary | ICD-10-CM | POA: Diagnosis not present

## 2013-10-25 DIAGNOSIS — M810 Age-related osteoporosis without current pathological fracture: Secondary | ICD-10-CM | POA: Diagnosis not present

## 2013-10-25 DIAGNOSIS — Z124 Encounter for screening for malignant neoplasm of cervix: Secondary | ICD-10-CM | POA: Diagnosis not present

## 2013-10-26 ENCOUNTER — Other Ambulatory Visit: Payer: Self-pay | Admitting: Obstetrics

## 2013-10-26 ENCOUNTER — Encounter: Payer: Self-pay | Admitting: Internal Medicine

## 2013-10-26 DIAGNOSIS — M810 Age-related osteoporosis without current pathological fracture: Secondary | ICD-10-CM

## 2013-10-31 DIAGNOSIS — E785 Hyperlipidemia, unspecified: Secondary | ICD-10-CM | POA: Diagnosis not present

## 2013-10-31 DIAGNOSIS — E039 Hypothyroidism, unspecified: Secondary | ICD-10-CM | POA: Diagnosis not present

## 2013-10-31 DIAGNOSIS — Z1331 Encounter for screening for depression: Secondary | ICD-10-CM | POA: Diagnosis not present

## 2013-10-31 DIAGNOSIS — M810 Age-related osteoporosis without current pathological fracture: Secondary | ICD-10-CM | POA: Diagnosis not present

## 2013-10-31 DIAGNOSIS — Z0000 Encounter for general adult medical examination without abnormal findings: Secondary | ICD-10-CM | POA: Diagnosis not present

## 2013-10-31 DIAGNOSIS — E663 Overweight: Secondary | ICD-10-CM | POA: Diagnosis not present

## 2013-11-06 DIAGNOSIS — M810 Age-related osteoporosis without current pathological fracture: Secondary | ICD-10-CM | POA: Diagnosis not present

## 2013-11-06 DIAGNOSIS — K219 Gastro-esophageal reflux disease without esophagitis: Secondary | ICD-10-CM | POA: Diagnosis not present

## 2013-11-06 DIAGNOSIS — E785 Hyperlipidemia, unspecified: Secondary | ICD-10-CM | POA: Diagnosis not present

## 2013-11-06 DIAGNOSIS — E039 Hypothyroidism, unspecified: Secondary | ICD-10-CM | POA: Diagnosis not present

## 2013-11-07 ENCOUNTER — Other Ambulatory Visit: Payer: Medicare Other

## 2013-11-22 DIAGNOSIS — M810 Age-related osteoporosis without current pathological fracture: Secondary | ICD-10-CM | POA: Diagnosis not present

## 2013-11-23 ENCOUNTER — Other Ambulatory Visit: Payer: Self-pay | Admitting: Obstetrics

## 2013-12-18 ENCOUNTER — Encounter (HOSPITAL_COMMUNITY): Payer: Self-pay

## 2013-12-18 ENCOUNTER — Ambulatory Visit (HOSPITAL_COMMUNITY)
Admission: RE | Admit: 2013-12-18 | Discharge: 2013-12-18 | Disposition: A | Discharge status: Home / Self Care | Payer: Medicare Other | Source: Ambulatory Visit | Attending: Obstetrics | Admitting: Obstetrics

## 2013-12-18 DIAGNOSIS — M810 Age-related osteoporosis without current pathological fracture: Secondary | ICD-10-CM | POA: Insufficient documentation

## 2013-12-18 MED ORDER — ZOLEDRONIC ACID 5 MG/100ML IV SOLN
5.0000 mg | Freq: Once | INTRAVENOUS | Status: AC
  Administered 2013-12-18: 5 mg via INTRAVENOUS
  Filled 2013-12-18: qty 100

## 2013-12-18 MED ORDER — SODIUM CHLORIDE 0.9 % IV SOLN
INTRAVENOUS | Status: DC
  Administered 2013-12-18: 250 mL via INTRAVENOUS

## 2013-12-18 NOTE — Discharge Instructions (Signed)
Zoledronic Acid injection (Paget's Disease, Osteoporosis) °What is this medicine? °ZOLEDRONIC ACID (ZOE le dron ik AS id) lowers the amount of calcium loss from bone. It is used to treat Paget's disease and osteoporosis in women. °This medicine may be used for other purposes; ask your health care provider or pharmacist if you have questions. °COMMON BRAND NAME(S): Reclast, Zometa °What should I tell my health care provider before I take this medicine? °They need to know if you have any of these conditions: °-aspirin-sensitive asthma °-cancer, especially if you are receiving medicines used to treat cancer °-dental disease or wear dentures °-infection °-kidney disease °-low levels of calcium in the blood °-past surgery on the parathyroid gland or intestines °-receiving corticosteroids like dexamethasone or prednisone °-an unusual or allergic reaction to zoledronic acid, other medicines, foods, dyes, or preservatives °-pregnant or trying to get pregnant °-breast-feeding °How should I use this medicine? °This medicine is for infusion into a vein. It is given by a health care professional in a hospital or clinic setting. °Talk to your pediatrician regarding the use of this medicine in children. This medicine is not approved for use in children. °Overdosage: If you think you have taken too much of this medicine contact a poison control center or emergency room at once. °NOTE: This medicine is only for you. Do not share this medicine with others. °What if I miss a dose? °It is important not to miss your dose. Call your doctor or health care professional if you are unable to keep an appointment. °What may interact with this medicine? °-certain antibiotics given by injection °-NSAIDs, medicines for pain and inflammation, like ibuprofen or naproxen °-some diuretics like bumetanide, furosemide °-teriparatide °This list may not describe all possible interactions. Give your health care provider a list of all the medicines,  herbs, non-prescription drugs, or dietary supplements you use. Also tell them if you smoke, drink alcohol, or use illegal drugs. Some items may interact with your medicine. °What should I watch for while using this medicine? °Visit your doctor or health care professional for regular checkups. It may be some time before you see the benefit from this medicine. Do not stop taking your medicine unless your doctor tells you to. Your doctor may order blood tests or other tests to see how you are doing. °Women should inform their doctor if they wish to become pregnant or think they might be pregnant. There is a potential for serious side effects to an unborn child. Talk to your health care professional or pharmacist for more information. °You should make sure that you get enough calcium and vitamin D while you are taking this medicine. Discuss the foods you eat and the vitamins you take with your health care professional. °Some people who take this medicine have severe bone, joint, and/or muscle pain. This medicine may also increase your risk for jaw problems or a broken thigh bone. Tell your doctor right away if you have severe pain in your jaw, bones, joints, or muscles. Tell your doctor if you have any pain that does not go away or that gets worse. °Tell your dentist and dental surgeon that you are taking this medicine. You should not have major dental surgery while on this medicine. See your dentist to have a dental exam and fix any dental problems before starting this medicine. Take good care of your teeth while on this medicine. Make sure you see your dentist for regular follow-up appointments. °What side effects may I notice from receiving this medicine? °  Side effects that you should report to your doctor or health care professional as soon as possible: -allergic reactions like skin rash, itching or hives, swelling of the face, lips, or tongue -anxiety, confusion, or depression -breathing problems -changes in  vision -eye pain -feeling faint or lightheaded, falls -jaw pain, especially after dental work -mouth sores -muscle cramps, stiffness, or weakness -trouble passing urine or change in the amount of urine Side effects that usually do not require medical attention (report to your doctor or health care professional if they continue or are bothersome): -bone, joint, or muscle pain -constipation -diarrhea -fever -hair loss -irritation at site where injected -loss of appetite -nausea, vomiting -stomach upset -trouble sleeping -trouble swallowing -weak or tired This list may not describe all possible side effects. Call your doctor for medical advice about side effects. You may report side effects to FDA at 1-800-FDA-1088. Where should I keep my medicine? This drug is given in a hospital or clinic and will not be stored at home. NOTE: This sheet is a summary. It may not cover all possible information. If you have questions about this medicine, talk to your doctor, pharmacist, or health care provider.  2015, [employer]vier/Gold Standard. (2012-10-03 10:03:48)  Drink fluids/water as tolerated over next 72hrs Tylenol or Ibuprofen OTC as directed Continue calcium and Vit D as directed by your MDZoledronic Acid injection (Paget's Disease, Osteoporosis) What is this medicine? ZOLEDRONIC ACID (ZOE le dron ik AS id) lowers the amount of calcium loss from bone. It is used to treat Paget's disease and osteoporosis in women. This medicine may be used for other purposes; ask your health care provider or pharmacist if you have questions. COMMON BRAND NAME(S): Reclast, Zometa What should I tell my health care provider before I take this medicine? They need to know if you have any of these conditions: -aspirin-sensitive asthma -cancer, especially if you are receiving medicines used to treat cancer -dental disease or wear dentures -infection -kidney disease -low levels of calcium in the blood -past surgery  on the parathyroid gland or intestines -receiving corticosteroids like dexamethasone or prednisone -an unusual or allergic reaction to zoledronic acid, other medicines, foods, dyes, or preservatives -pregnant or trying to get pregnant -breast-feeding How should I use this medicine? This medicine is for infusion into a vein. It is given by a health care professional in a hospital or clinic setting. Talk to your pediatrician regarding the use of this medicine in children. This medicine is not approved for use in children. Overdosage: If you think you have taken too much of this medicine contact a poison control center or emergency room at once. NOTE: This medicine is only for you. Do not share this medicine with others. What if I miss a dose? It is important not to miss your dose. Call your doctor or health care professional if you are unable to keep an appointment. What may interact with this medicine? -certain antibiotics given by injection -NSAIDs, medicines for pain and inflammation, like ibuprofen or naproxen -some diuretics like bumetanide, furosemide -teriparatide This list may not describe all possible interactions. Give your health care provider a list of all the medicines, herbs, non-prescription drugs, or dietary supplements you use. Also tell them if you smoke, drink alcohol, or use illegal drugs. Some items may interact with your medicine. What should I watch for while using this medicine? Visit your doctor or health care professional for regular checkups. It may be some time before you see the benefit from this medicine. Do  not stop taking your medicine unless your doctor tells you to. Your doctor may order blood tests or other tests to see how you are doing. Women should inform their doctor if they wish to become pregnant or think they might be pregnant. There is a potential for serious side effects to an unborn child. Talk to your health care professional or pharmacist for more  information. You should make sure that you get enough calcium and vitamin D while you are taking this medicine. Discuss the foods you eat and the vitamins you take with your health care professional. Some people who take this medicine have severe bone, joint, and/or muscle pain. This medicine may also increase your risk for jaw problems or a broken thigh bone. Tell your doctor right away if you have severe pain in your jaw, bones, joints, or muscles. Tell your doctor if you have any pain that does not go away or that gets worse. Tell your dentist and dental surgeon that you are taking this medicine. You should not have major dental surgery while on this medicine. See your dentist to have a dental exam and fix any dental problems before starting this medicine. Take good care of your teeth while on this medicine. Make sure you see your dentist for regular follow-up appointments. What side effects may I notice from receiving this medicine? Side effects that you should report to your doctor or health care professional as soon as possible: -allergic reactions like skin rash, itching or hives, swelling of the face, lips, or tongue -anxiety, confusion, or depression -breathing problems -changes in vision -eye pain -feeling faint or lightheaded, falls -jaw pain, especially after dental work -mouth sores -muscle cramps, stiffness, or weakness -trouble passing urine or change in the amount of urine Side effects that usually do not require medical attention (report to your doctor or health care professional if they continue or are bothersome): -bone, joint, or muscle pain -constipation -diarrhea -fever -hair loss -irritation at site where injected -loss of appetite -nausea, vomiting -stomach upset -trouble sleeping -trouble swallowing -weak or tired This list may not describe all possible side effects. Call your doctor for medical advice about side effects. You may report side effects to FDA at  1-800-FDA-1088. Where should I keep my medicine? This drug is given in a hospital or clinic and will not be stored at home. NOTE: This sheet is a summary. It may not cover all possible information. If you have questions about this medicine, talk to your doctor, pharmacist, or health care provider.  2015, [employer]/Gold Standard. (2012-10-03 10:03:48)

## 2013-12-19 DIAGNOSIS — M25669 Stiffness of unspecified knee, not elsewhere classified: Secondary | ICD-10-CM | POA: Diagnosis not present

## 2013-12-26 ENCOUNTER — Ambulatory Visit (AMBULATORY_SURGERY_CENTER): Payer: Medicare Other | Admitting: *Deleted

## 2013-12-26 VITALS — Ht 61.0 in | Wt 167.0 lb

## 2013-12-26 DIAGNOSIS — Z1211 Encounter for screening for malignant neoplasm of colon: Secondary | ICD-10-CM

## 2013-12-26 MED ORDER — NA SULFATE-K SULFATE-MG SULF 17.5-3.13-1.6 GM/177ML PO SOLN: 1.0000 | Freq: Once | ORAL | Status: DC

## 2013-12-26 NOTE — Progress Notes (Signed)
No egg or soy allergy. No anesthesia problems.  No diet meds.  No home O2.  Total Knee replacement.

## 2013-12-28 ENCOUNTER — Ambulatory Visit (INDEPENDENT_AMBULATORY_CARE_PROVIDER_SITE_OTHER): Payer: Medicare Other | Admitting: Podiatrist

## 2013-12-28 DIAGNOSIS — B351 Tinea unguium: Secondary | ICD-10-CM

## 2013-12-28 DIAGNOSIS — M79609 Pain in unspecified limb: Secondary | ICD-10-CM | POA: Diagnosis not present

## 2013-12-28 DIAGNOSIS — M79676 Pain in unspecified toe(s): Principal | ICD-10-CM

## 2013-12-28 NOTE — Progress Notes (Signed)
HPI: Patient presents today for follow up of painful mycotic nails. Patient relates pain with ambulation and in shoes. Past medical history, meds, and allergies reviewed.  Objective: Neurovascular status unchanged with palpable pedal pulses and neurological sensation intact. Patients nails are elongated, thickened, discolored, dystrophic with ingrown deformity present.  Assessment: painful mycotic nails 1-5 bilateral  Plan: Discussed treatment options and alternatives. Debrided nails without complication. Return appointment recommended at routine intervals of 3 months.   

## 2014-01-09 ENCOUNTER — Encounter: Payer: Self-pay | Admitting: Internal Medicine

## 2014-01-09 ENCOUNTER — Ambulatory Visit (AMBULATORY_SURGERY_CENTER): Payer: Medicare Other | Admitting: Internal Medicine

## 2014-01-09 VITALS — BP 160/78 | HR 69 | Temp 98.2°F | Resp 18 | Ht 61.0 in | Wt 167.0 lb

## 2014-01-09 DIAGNOSIS — Z1211 Encounter for screening for malignant neoplasm of colon: Secondary | ICD-10-CM | POA: Diagnosis not present

## 2014-01-09 DIAGNOSIS — E669 Obesity, unspecified: Secondary | ICD-10-CM | POA: Diagnosis not present

## 2014-01-09 DIAGNOSIS — E039 Hypothyroidism, unspecified: Secondary | ICD-10-CM | POA: Diagnosis not present

## 2014-01-09 DIAGNOSIS — J449 Chronic obstructive pulmonary disease, unspecified: Secondary | ICD-10-CM | POA: Diagnosis not present

## 2014-01-09 DIAGNOSIS — Z86010 Personal history of colonic polyps: Secondary | ICD-10-CM | POA: Diagnosis not present

## 2014-01-09 MED ORDER — SODIUM CHLORIDE 0.9 % IV SOLN: 500.0000 mL | INTRAVENOUS | Status: DC

## 2014-01-09 NOTE — Op Note (Signed)
[place]  [address] [place], [address]   COLONOSCOPY PROCEDURE REPORT  PATIENT: Darlene [name], Darlene [name]  MR#: [id number] BIRTHDATE: March 19, 1935 , 49  yrs. old GENDER: Female ENDOSCOPIST: Gatha Mayer, MD, [facility] PROCEDURE DATE:  01/09/2014 PROCEDURE:   Colonoscopy, screening First Screening Colonoscopy - Avg.  risk and is 50 yrs.  old or older - No.  Prior Negative Screening - Now for repeat screening. 10 or more years since last screening  History of Adenoma - Now for follow-up colonoscopy & has been > or = to 3 yrs.  N/A  Polyps Removed Today? No.  Recommend repeat exam, <10 yrs? No. ASA CLASS:   Class III INDICATIONS:average risk screening and Last colonoscopy performed 10 years ago. MEDICATIONS: propofol (Diprivan) 200mg  IV, MAC sedation, administered by CRNA, and These medications were titrated to patient response per physician's verbal order  DESCRIPTION OF PROCEDURE:   After the risks benefits and alternatives of the procedure were thoroughly explained, informed consent was obtained.  A digital rectal exam revealed no abnormalities of the rectum.   The LB [id number]  endoscope was introduced through the anus and advanced to the cecum, which was identified by both the appendix and ileocecal valve. No adverse events experienced.   The quality of the prep was excellent using Suprep  The instrument was then slowly withdrawn as the colon was fully examined.      COLON FINDINGS: A normal appearing cecum, ileocecal valve, and appendiceal orifice were identified.  The ascending, hepatic flexure, transverse, splenic flexure, descending, sigmoid colon and rectum appeared unremarkable.  No polyps or cancers were seen. Retroflexed views revealed no abnormalities. The time to cecum=5 minutes 49 seconds.  Withdrawal time=6 minutes 0 seconds.  The scope was withdrawn and the procedure completed. COMPLICATIONS: There were no complications.  ENDOSCOPIC  IMPRESSION: Normal colonoscopy - excellent prep  RECOMMENDATIONS: Follow-up as needed - does not need further routine CRCA screening at her age   eSigned:  Gatha Mayer, MD, [facility] 01/09/2014 10:21 AM   cc: Thressa Sheller, MD and The Patient

## 2014-01-09 NOTE — Progress Notes (Signed)
Pt took a long time dressing and she then went to the rest room was the hold up on being discharged.  No problems noted. maw

## 2014-01-09 NOTE — Progress Notes (Signed)
No problems noted in the recovery room. maw 

## 2014-01-09 NOTE — Patient Instructions (Signed)
YOU HAD AN ENDOSCOPIC PROCEDURE TODAY AT THE [facility]: Refer to the procedure report that was given to you for any specific questions about what was found during the examination.  If the procedure report does not answer your questions, please call your gastroenterologist to clarify.  If you requested that your care partner not be given the details of your procedure findings, then the procedure report has been included in a sealed envelope for you to review at your convenience later.  YOU SHOULD EXPECT: Some feelings of bloating in the abdomen. Passage of more gas than usual.  Walking can help get rid of the air that was put into your GI tract during the procedure and reduce the bloating. If you had a lower endoscopy (such as a colonoscopy or flexible sigmoidoscopy) you may notice spotting of blood in your stool or on the toilet paper. If you underwent a bowel prep for your procedure, then you may not have a normal bowel movement for a few days.  DIET: Your first meal following the procedure should be a light meal and then it is ok to progress to your normal diet.  A half-sandwich or bowl of soup is an example of a good first meal.  Heavy or fried foods are harder to digest and may make you feel nauseous or bloated.  Likewise meals heavy in dairy and vegetables can cause extra gas to form and this can also increase the bloating.  Drink plenty of fluids but you should avoid alcoholic beverages for 24 hours.  ACTIVITY: Your care partner should take you home directly after the procedure.  You should plan to take it easy, moving slowly for the rest of the day.  You can resume normal activity the day after the procedure however you should NOT DRIVE or use heavy machinery for 24 hours (because of the sedation medicines used during the test).    SYMPTOMS TO REPORT IMMEDIATELY: A gastroenterologist can be reached at any hour.  During normal business hours, 8:30 AM to 5:00 PM Monday through Friday,  call [phone].  After hours and on weekends, please call the GI answering service at [phone] who will take a message and have the physician on call contact you.   Following lower endoscopy (colonoscopy or flexible sigmoidoscopy):  Excessive amounts of blood in the stool  Significant tenderness or worsening of abdominal pains  Swelling of the abdomen that is new, acute  Fever of 100F or higher  FOLLOW UP: If any biopsies were taken you will be contacted by phone or by letter within the next 1-3 weeks.  Call your gastroenterologist if you have not heard about the biopsies in 3 weeks.  Our staff will call the home number listed on your records the next business day following your procedure to check on you and address any questions or concerns that you may have at that time regarding the information given to you following your procedure. This is a courtesy call and so if there is no answer at the home number and we have not heard from you through the emergency physician on call, we will assume that you have returned to your regular daily activities without incident.  SIGNATURES/CONFIDENTIALITY: You and/or your care partner have signed paperwork which will be entered into your electronic medical record.  These signatures attest to the fact that that the information above on your After Visit Summary has been reviewed and is understood.  Full responsibility of the confidentiality of this   discharge information lies with you and/or your care-partner.     You may resume your current medications today. Please call if any questions or concerns.

## 2014-01-09 NOTE — Progress Notes (Signed)
Procedure endfs, to recovery, report given and VSS. 

## 2014-01-10 ENCOUNTER — Telehealth: Payer: Self-pay | Admitting: *Deleted

## 2014-01-10 NOTE — Telephone Encounter (Signed)
No identifier, left message, follow-up  

## 2014-01-23 DIAGNOSIS — H2510 Age-related nuclear cataract, unspecified eye: Secondary | ICD-10-CM | POA: Diagnosis not present

## 2014-02-16 DIAGNOSIS — Z23 Encounter for immunization: Secondary | ICD-10-CM | POA: Diagnosis not present

## 2014-04-12 ENCOUNTER — Encounter (HOSPITAL_COMMUNITY): Payer: Self-pay | Admitting: Cardiovascular Disease

## 2014-04-19 ENCOUNTER — Ambulatory Visit (INDEPENDENT_AMBULATORY_CARE_PROVIDER_SITE_OTHER): Payer: Medicare Other | Admitting: Podiatrist

## 2014-04-19 ENCOUNTER — Ambulatory Visit: Payer: Medicare Other | Admitting: Podiatrist

## 2014-04-19 ENCOUNTER — Encounter: Payer: Self-pay | Admitting: Podiatrist

## 2014-04-19 DIAGNOSIS — B351 Tinea unguium: Secondary | ICD-10-CM | POA: Diagnosis not present

## 2014-04-19 DIAGNOSIS — M79676 Pain in unspecified toe(s): Secondary | ICD-10-CM

## 2014-04-19 NOTE — Progress Notes (Signed)
HPI: Patient presents today for follow up of painful mycotic nails. Patient relates pain with ambulation and in shoes. Past medical history, meds, and allergies reviewed.  Objective: Neurovascular status unchanged with palpable pedal pulses and neurological sensation intact. Patients nails are elongated, thickened, discolored, dystrophic with ingrown deformity present.  Assessment: painful mycotic nails 1-5 bilateral  Plan: Discussed treatment options and alternatives. Debrided nails without complication. Return appointment recommended at routine intervals of 3 months.   

## 2014-04-20 ENCOUNTER — Ambulatory Visit: Payer: Medicare Other | Admitting: Podiatrist

## 2014-05-01 DIAGNOSIS — M810 Age-related osteoporosis without current pathological fracture: Secondary | ICD-10-CM | POA: Diagnosis not present

## 2014-05-01 DIAGNOSIS — E039 Hypothyroidism, unspecified: Secondary | ICD-10-CM | POA: Diagnosis not present

## 2014-05-01 DIAGNOSIS — E785 Hyperlipidemia, unspecified: Secondary | ICD-10-CM | POA: Diagnosis not present

## 2014-05-08 DIAGNOSIS — K219 Gastro-esophageal reflux disease without esophagitis: Secondary | ICD-10-CM | POA: Diagnosis not present

## 2014-05-08 DIAGNOSIS — E785 Hyperlipidemia, unspecified: Secondary | ICD-10-CM | POA: Diagnosis not present

## 2014-05-08 DIAGNOSIS — E039 Hypothyroidism, unspecified: Secondary | ICD-10-CM | POA: Diagnosis not present

## 2014-05-08 DIAGNOSIS — M810 Age-related osteoporosis without current pathological fracture: Secondary | ICD-10-CM | POA: Diagnosis not present

## 2014-06-25 DIAGNOSIS — Z96651 Presence of right artificial knee joint: Secondary | ICD-10-CM | POA: Diagnosis not present

## 2014-06-25 DIAGNOSIS — M2011 Hallux valgus (acquired), right foot: Secondary | ICD-10-CM | POA: Diagnosis not present

## 2014-06-26 DIAGNOSIS — H6122 Impacted cerumen, left ear: Secondary | ICD-10-CM | POA: Diagnosis not present

## 2014-06-26 DIAGNOSIS — H903 Sensorineural hearing loss, bilateral: Secondary | ICD-10-CM | POA: Diagnosis not present

## 2014-07-12 ENCOUNTER — Other Ambulatory Visit (HOSPITAL_BASED_OUTPATIENT_CLINIC_OR_DEPARTMENT_OTHER): Payer: Medicare Other

## 2014-07-12 ENCOUNTER — Other Ambulatory Visit: Payer: Self-pay | Admitting: Hematology and Oncology

## 2014-07-12 ENCOUNTER — Telehealth: Payer: Self-pay | Admitting: Hematology and Oncology

## 2014-07-12 ENCOUNTER — Encounter: Payer: Self-pay | Admitting: Hematology and Oncology

## 2014-07-12 ENCOUNTER — Ambulatory Visit (HOSPITAL_BASED_OUTPATIENT_CLINIC_OR_DEPARTMENT_OTHER): Payer: Medicare Other | Admitting: Hematology and Oncology

## 2014-07-12 VITALS — BP 115/57 | HR 63 | Temp 97.5°F | Resp 18 | Ht 61.0 in | Wt 169.7 lb

## 2014-07-12 DIAGNOSIS — C8591 Non-Hodgkin lymphoma, unspecified, lymph nodes of head, face, and neck: Secondary | ICD-10-CM

## 2014-07-12 DIAGNOSIS — C50919 Malignant neoplasm of unspecified site of unspecified female breast: Secondary | ICD-10-CM

## 2014-07-12 DIAGNOSIS — Z8572 Personal history of non-Hodgkin lymphomas: Secondary | ICD-10-CM

## 2014-07-12 DIAGNOSIS — Z853 Personal history of malignant neoplasm of breast: Secondary | ICD-10-CM

## 2014-07-12 DIAGNOSIS — C8581 Other specified types of non-Hodgkin lymphoma, lymph nodes of head, face, and neck: Secondary | ICD-10-CM | POA: Diagnosis not present

## 2014-07-12 LAB — CBC & DIFF AND RETIC
BASO%: 0.4 % (ref 0.0–2.0)
BASOS ABS: 0 10*3/uL (ref 0.0–0.1)
EOS%: 2.5 % (ref 0.0–7.0)
Eosinophils Absolute: 0.2 10*3/uL (ref 0.0–0.5)
HCT: 35.8 % (ref 34.8–46.6)
HGB: 11.7 g/dL (ref 11.6–15.9)
Immature Retic Fract: 2.3 % (ref 1.60–10.00)
LYMPH%: 24.6 % (ref 14.0–49.7)
MCH: 29.7 pg (ref 25.1–34.0)
MCHC: 32.7 g/dL (ref 31.5–36.0)
MCV: 90.9 fL (ref 79.5–101.0)
MONO#: 0.5 10*3/uL (ref 0.1–0.9)
MONO%: 6.2 % (ref 0.0–14.0)
NEUT#: 5.1 10*3/uL (ref 1.5–6.5)
NEUT%: 66.3 % (ref 38.4–76.8)
PLATELETS: 187 10*3/uL (ref 145–400)
RBC: 3.94 10*6/uL (ref 3.70–5.45)
RDW: 13 % (ref 11.2–14.5)
Retic %: 1.25 % (ref 0.70–2.10)
Retic Ct Abs: 49.25 10*3/uL (ref 33.70–90.70)
WBC: 7.8 10*3/uL (ref 3.9–10.3)
lymph#: 1.9 10*3/uL (ref 0.9–3.3)

## 2014-07-12 LAB — COMPREHENSIVE METABOLIC PANEL (CC13)
ALBUMIN: 3.8 g/dL (ref 3.5–5.0)
ALK PHOS: 49 U/L (ref 40–150)
ALT: 15 U/L (ref 0–55)
ANION GAP: 7 meq/L (ref 3–11)
AST: 11 U/L (ref 5–34)
BUN: 13.6 mg/dL (ref 7.0–26.0)
CALCIUM: 9.6 mg/dL (ref 8.4–10.4)
CHLORIDE: 104 meq/L (ref 98–109)
CO2: 23 meq/L (ref 22–29)
Creatinine: 0.7 mg/dL (ref 0.6–1.1)
EGFR: 82 mL/min/{1.73_m2} — AB (ref >90)
GLUCOSE: 128 mg/dL (ref 70–140)
Potassium: 4 mEq/L (ref 3.5–5.1)
Sodium: 134 mEq/L — ABNORMAL LOW (ref 136–145)
Total Bilirubin: 0.66 mg/dL (ref 0.20–1.20)
Total Protein: 6.4 g/dL (ref 6.4–8.3)

## 2014-07-12 LAB — LACTATE DEHYDROGENASE (CC13): LDH: 156 U/L (ref 125–245)

## 2014-07-12 NOTE — Telephone Encounter (Signed)
Gave avs & Calendar for March 2017 & June for [place].

## 2014-07-12 NOTE — Assessment & Plan Note (Signed)
Clinically, she have no signs of lymphoma recurrence. I will continue history, physical examination blood work and yearly examination.

## 2014-07-12 NOTE — Progress Notes (Signed)
[name] FOLLOW-UP progress notes  Patient Care Team: Thressa Sheller, MD as PCP - General (Internal Medicine)  CHIEF COMPLAINTS/PURPOSE OF VISIT:  History of lymphoma and breast cancer  HISTORY OF PRESENTING ILLNESS:  Darlene [name] 80 y.o. female was transferred to my care after her prior physician has left.  I reviewed the patient's records extensive and collaborated the history with the patient. 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 She never required any systemic treatment. 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 This has also resolved on its own. She has never developed any additional adenopathy other than the single lymph node palpable in the left supraclavicular fossa.  She did develop a second primary stage I,  0.8 cm,  ER negative cancer of the right breast, treated with lumpectomy, radiation and six cycles of CMF chemotherapy in 1994. No evidence of a new disease from the breast cancer either. Most recent mammogram done in June 2015 showed stable fibroglandular changes in her breasts. She was called back for an ultrasound of the left breast which also came back benign. The patient is sad that her husband passed away a year ago.  She denies new lymphadenopathy. No recent infection. She denies any recent abnormal breast examination, palpable mass, abnormal breast appearance or nipple changes   MEDICAL HISTORY:  Past Medical History  Diagnosis Date  . Chronic ITP (idiopathic thrombocytopenia) 07/06/2011  . Hyperlipidemia     takes Lovastatin daily  . Right knee DJD   . Cancer of breast   . Lymphoma   . Breast cancer, stage 1, estrogen receptor negative 07/06/2011  . Lymphoma of lymph nodes of head, face, and/or neck 07/06/2011  .  Lymphoma     B cell   . Constipation     takes Colace daily  . GERD (gastroesophageal reflux disease)     takes Nexium daily  . History of palpitations     takes Metoprolol daily  . Joint pain   . Joint swelling   . COPD (chronic obstructive pulmonary disease)     mild  . History of vertigo   . Osteopenia   . History of colon polyps   . Nocturia   . History of depression     received shock treatments   . Thyroid disease   . Hypothyroid 07/06/2011    takes SYnthroid daily  . Osteoporosis     hx of and take Reclast  . Umbilical hernia     SURGICAL HISTORY: Past Surgical History  Procedure Laterality Date  . Knee surgery    . Adenoidectomy      as a child  . Tonsillectomy      as a child  . Breast surgery Right   . Right  breast surgery      d/t breast cancer  . Bunionectomy Right   . Foot surgery Right   . Turbinate reduction    . Cardiac catheterization  2014  . Colonoscopy    . Total knee arthroplasty Right 06/19/2013    Procedure: TOTAL KNEE ARTHROPLASTY;  Surgeon: Lorn Junes, MD;  Location: [facility];  Service: Orthopedics;  Laterality: Right;  . Upper gastrointestinal endoscopy    . Left heart catheterization with coronary angiogram N/A 12/05/2012    Procedure: LEFT HEART CATHETERIZATION WITH CORONARY ANGIOGRAM;  Surgeon: [name] Sine, MD;  Location: [facility] CATH LAB;  Service: Cardiovascular;  Laterality: N/A;    SOCIAL HISTORY: History   Social History  . Marital Status: Married    Spouse Name: N/A  . Number of Children: N/A  . Years of Education: N/A   Occupational History  . Not on file.   Social History Main Topics  . Smoking status: Former Smoker -- 0.30 packs/day    Types: Cigarettes  . Smokeless tobacco: Never Used     Comment: quit smoking 1985  . Alcohol Use: 0.0 oz/week     Comment: occasionally wine  . Drug Use: No  . Sexual Activity: Yes    Birth Control/ Protection: Post-menopausal   Other Topics Concern  . Not on file   Social  History Narrative    FAMILY HISTORY: Family History  Problem Relation Age of Onset  . Hyperlipidemia Father   . Heart disease Father   . Heart disease Sister   . Cancer Brother   . Diabetes Son   . Heart disease Maternal Grandfather   . Colon cancer Neg Hx     ALLERGIES:  is allergic to oxycodone; codeine; doxycycline; meperidine and related; and sulfa antibiotics.  MEDICATIONS:  Current Outpatient Prescriptions  Medication Sig Dispense Refill  . acetaminophen (TYLENOL) 500 MG tablet Take 1,000 mg by mouth 2 (two) times daily.    Marland Kitchen aspirin 81 MG tablet Take 81 mg by mouth daily.    . Calcium Carbonate-Vitamin D (CALCIUM 600+D) 600-200 MG-UNIT TABS Take 1 tablet by mouth 2 (two) times daily.     . cholecalciferol (VITAMIN D) 1000 UNITS tablet Take 1,000 Units by mouth daily.    . citalopram (CELEXA) 10 MG tablet Take 40 mg by mouth daily.     . Cyanocobalamin (VITAMIN B-12 PO) Take 1,000 mcg by mouth daily.    Marland Kitchen esomeprazole (NEXIUM) 40 MG capsule Take 40 mg by mouth 2 (two) times daily before a meal.     . famciclovir (FAMVIR) 125 MG tablet Take 250 mg by mouth 2 (two) times daily as needed. For 5 days as needed for outbreak    . levothyroxine (SYNTHROID, LEVOTHROID) 75 MCG tablet Take 75 mcg by mouth daily.      Marland Kitchen lovastatin (MEVACOR) 20 MG tablet Take 20 mg by mouth at bedtime.      . metoprolol succinate (TOPROL-XL) 25 MG 24 hr tablet Take 25 mg by mouth daily.     . mometasone (ELOCON) 0.1 % cream Apply 1 application topically daily as needed (for itching).     . Multiple Vitamins-Minerals (MULTIVITAMINS THER. W/MINERALS) TABS Take 1 tablet by mouth daily.      Vladimir Faster Glycol-Propyl Glycol (SYSTANE) 0.4-0.3 % SOLN Apply 2 drops to eye 2 (two) times daily as needed (for dry eyes).    . Vitamin D, Ergocalciferol, (DRISDOL) 50000 UNITS CAPS Take 50,000 Units by mouth every 14 (fourteen) days.       No current facility-administered medications for this visit.    REVIEW OF  SYSTEMS:   Constitutional: Denies fevers, chills or abnormal night sweats Eyes: Denies blurriness of vision, double vision or watery eyes Ears, nose, mouth, throat, and face: Denies mucositis or sore throat Respiratory: Denies cough, dyspnea or wheezes Cardiovascular: Denies palpitation, chest discomfort or lower extremity swelling Gastrointestinal:  Denies nausea, heartburn or change in bowel habits Skin: Denies abnormal skin rashes Lymphatics: Denies new lymphadenopathy or easy bruising Neurological:Denies numbness, tingling or new weaknesses Behavioral/Psych: Mood  is stable, no new changes  All other systems were reviewed with the patient and are negative.  PHYSICAL EXAMINATION: ECOG PERFORMANCE STATUS: 0 - Asymptomatic  Filed Vitals:   07/12/14 1326  BP: 115/57  Pulse: 63  Temp: 97.5 F (36.4 C)  Resp: 18   Filed Weights   07/12/14 1326  Weight: 169 lb 11.2 oz (76.975 kg)    GENERAL:alert, no distress and comfortable SKIN: skin color, texture, turgor are normal, no rashes or significant lesions EYES: normal, conjunctiva are pink and non-injected, sclera clear OROPHARYNX:no exudate, normal lips, buccal mucosa, and tongue  NECK: supple, thyroid normal size, non-tender, without nodularity LYMPH:  no palpable lymphadenopathy in the cervical, axillary or inguinal. Well-healed surgical scar in the left supraclavicular region LUNGS: clear to auscultation and percussion with normal breathing effort HEART: regular rate & rhythm and no murmurs without lower extremity edema ABDOMEN:abdomen soft, non-tender and normal bowel sounds Musculoskeletal:no cyanosis of digits and no clubbing  PSYCH: alert & oriented x 3 with fluent speech NEURO: no focal motor/sensory deficits Bilateral breast examination were performed. Well-healed lumpectomy scar on the right breast. LABORATORY DATA:  I have reviewed the data as listed Lab Results  Component Value Date   WBC 7.8 07/12/2014   HGB 11.7  07/12/2014   HCT 35.8 07/12/2014   MCV 90.9 07/12/2014   PLT 187 07/12/2014    Recent Labs  07/12/14 1248  NA 134*  K 4.0  CO2 23  GLUCOSE 128  BUN 13.6  CREATININE 0.7  CALCIUM 9.6  PROT 6.4  ALBUMIN 3.8  AST 11  ALT 15  ALKPHOS 49  BILITOT 0.66    ASSESSMENT & PLAN:  History of breast cancer in female Clinically, she have no signs of recurrence. Her last mammogram showed a new cyst in the left breast and is being monitored. I reorder diagnostic mammogram and ultrasound.   History of non-Hodgkin's lymphoma Clinically, she have no signs of lymphoma recurrence. I will continue history, physical examination blood work and yearly examination.    Orders Placed This Encounter  Procedures  . MM Digital Diagnostic Bilat    EPIC ORDER PF: 10/04/2013 BCG  NO NEEDS  CR/AMBER  MEDICARE  NO IMPLANTS/HX BR CA/ RE-EVAL BREAST MASS LT      Standing Status: Future     Number of Occurrences:      Standing Expiration Date: 08/16/2015    Order Specific Question:  Reason for exam:    Answer:  hx breast ca right and left breast cyst    Order Specific Question:  Preferred imaging location?    Answer:  [facility]  . US BREAST LTD UNI LEFT INC AXILLA    CO-SIGN REQ 07/12/14 PF: 10/04/2013 BCG  NO NEEDS  CR/AMBER  MEDICARE  NO IMPLANTS/HX BR CA/ RE-EVAL BREAST MASS LT       Standing Status: Future     Number of Occurrences:      Standing Expiration Date: 09/11/2015    Order Specific Question:  Reason for Exam (SYMPTOM  OR DIAGNOSIS REQUIRED)    Answer:  hx breast ca right and left breast cyst    Order Specific Question:  Preferred imaging location?    Answer:  [facility]  . CBC with Differential/Platelet    Standing Status: Future     Number of Occurrences:      Standing Expiration Date: 08/16/2015    All questions were answered. The patient knows to call the clinic with any problems, questions or  concerns. I spent 15 minutes counseling the patient face to face. The  total time spent in the appointment was 20 minutes and more than 50% was on counseling.     [facility], Japheth Diekman, MD 07/12/2014 2:04 PM

## 2014-07-12 NOTE — Assessment & Plan Note (Signed)
Clinically, she have no signs of recurrence. Her last mammogram showed a new cyst in the left breast and is being monitored. I reorder diagnostic mammogram and ultrasound.

## 2014-07-13 LAB — SEDIMENTATION RATE: SED RATE: 4 mm/h (ref 0–30)

## 2014-07-16 DIAGNOSIS — M2021 Hallux rigidus, right foot: Secondary | ICD-10-CM | POA: Diagnosis not present

## 2014-07-16 DIAGNOSIS — M2041 Other hammer toe(s) (acquired), right foot: Secondary | ICD-10-CM | POA: Diagnosis not present

## 2014-07-26 ENCOUNTER — Ambulatory Visit (INDEPENDENT_AMBULATORY_CARE_PROVIDER_SITE_OTHER): Payer: Medicare Other | Admitting: Podiatrist

## 2014-07-26 ENCOUNTER — Encounter: Payer: Self-pay | Admitting: Podiatrist

## 2014-07-26 DIAGNOSIS — M79676 Pain in unspecified toe(s): Secondary | ICD-10-CM | POA: Diagnosis not present

## 2014-07-26 DIAGNOSIS — B351 Tinea unguium: Secondary | ICD-10-CM

## 2014-08-03 DIAGNOSIS — E785 Hyperlipidemia, unspecified: Secondary | ICD-10-CM | POA: Diagnosis not present

## 2014-08-03 DIAGNOSIS — E039 Hypothyroidism, unspecified: Secondary | ICD-10-CM | POA: Diagnosis not present

## 2014-08-03 DIAGNOSIS — M810 Age-related osteoporosis without current pathological fracture: Secondary | ICD-10-CM | POA: Diagnosis not present

## 2014-08-04 NOTE — Progress Notes (Signed)
HPI: Patient presents today for follow up of painful mycotic nails. Patient relates pain with ambulation and in shoes. Past medical history, meds, and allergies reviewed.  Objective: Neurovascular status unchanged with palpable pedal pulses and neurological sensation intact. Patients nails are elongated, thickened, discolored, dystrophic with ingrown deformity present.  Assessment: painful mycotic nails 1-5 bilateral  Plan: Discussed treatment options and alternatives. Debrided nails without complication. Return appointment recommended at routine intervals of 3 months.

## 2014-08-07 DIAGNOSIS — J019 Acute sinusitis, unspecified: Secondary | ICD-10-CM | POA: Diagnosis not present

## 2014-08-07 DIAGNOSIS — R05 Cough: Secondary | ICD-10-CM | POA: Diagnosis not present

## 2014-08-09 DIAGNOSIS — M2021 Hallux rigidus, right foot: Secondary | ICD-10-CM | POA: Diagnosis not present

## 2014-08-09 DIAGNOSIS — M6701 Short Achilles tendon (acquired), right ankle: Secondary | ICD-10-CM | POA: Diagnosis not present

## 2014-08-09 DIAGNOSIS — M2041 Other hammer toe(s) (acquired), right foot: Secondary | ICD-10-CM | POA: Diagnosis not present

## 2014-08-10 DIAGNOSIS — E785 Hyperlipidemia, unspecified: Secondary | ICD-10-CM | POA: Diagnosis not present

## 2014-08-10 DIAGNOSIS — M810 Age-related osteoporosis without current pathological fracture: Secondary | ICD-10-CM | POA: Diagnosis not present

## 2014-08-10 DIAGNOSIS — E039 Hypothyroidism, unspecified: Secondary | ICD-10-CM | POA: Diagnosis not present

## 2014-08-10 DIAGNOSIS — K219 Gastro-esophageal reflux disease without esophagitis: Secondary | ICD-10-CM | POA: Diagnosis not present

## 2014-08-16 DIAGNOSIS — H903 Sensorineural hearing loss, bilateral: Secondary | ICD-10-CM | POA: Diagnosis not present

## 2014-08-16 DIAGNOSIS — H9313 Tinnitus, bilateral: Secondary | ICD-10-CM | POA: Diagnosis not present

## 2014-08-28 DIAGNOSIS — M19019 Primary osteoarthritis, unspecified shoulder: Secondary | ICD-10-CM | POA: Diagnosis not present

## 2014-10-08 ENCOUNTER — Ambulatory Visit
Admission: RE | Admit: 2014-10-08 | Discharge: 2014-10-08 | Disposition: A | Discharge status: Home / Self Care | Payer: Medicare Other | Source: Ambulatory Visit | Attending: Hematology and Oncology | Admitting: Hematology and Oncology

## 2014-10-08 DIAGNOSIS — Z853 Personal history of malignant neoplasm of breast: Secondary | ICD-10-CM

## 2014-10-08 DIAGNOSIS — N6002 Solitary cyst of left breast: Secondary | ICD-10-CM | POA: Diagnosis not present

## 2014-10-09 DIAGNOSIS — H903 Sensorineural hearing loss, bilateral: Secondary | ICD-10-CM | POA: Diagnosis not present

## 2014-10-16 DIAGNOSIS — H9203 Otalgia, bilateral: Secondary | ICD-10-CM | POA: Diagnosis not present

## 2014-10-16 DIAGNOSIS — H903 Sensorineural hearing loss, bilateral: Secondary | ICD-10-CM | POA: Diagnosis not present

## 2014-11-06 DIAGNOSIS — D696 Thrombocytopenia, unspecified: Secondary | ICD-10-CM | POA: Diagnosis not present

## 2014-11-06 DIAGNOSIS — Z0000 Encounter for general adult medical examination without abnormal findings: Secondary | ICD-10-CM | POA: Diagnosis not present

## 2014-11-06 DIAGNOSIS — F3341 Major depressive disorder, recurrent, in partial remission: Secondary | ICD-10-CM | POA: Diagnosis not present

## 2014-11-06 DIAGNOSIS — Z1389 Encounter for screening for other disorder: Secondary | ICD-10-CM | POA: Diagnosis not present

## 2014-11-06 DIAGNOSIS — E039 Hypothyroidism, unspecified: Secondary | ICD-10-CM | POA: Diagnosis not present

## 2014-11-06 DIAGNOSIS — M810 Age-related osteoporosis without current pathological fracture: Secondary | ICD-10-CM | POA: Diagnosis not present

## 2014-11-06 DIAGNOSIS — E785 Hyperlipidemia, unspecified: Secondary | ICD-10-CM | POA: Diagnosis not present

## 2014-11-06 DIAGNOSIS — Z23 Encounter for immunization: Secondary | ICD-10-CM | POA: Diagnosis not present

## 2014-11-08 ENCOUNTER — Encounter: Payer: Self-pay | Admitting: Podiatry

## 2014-11-08 ENCOUNTER — Ambulatory Visit (INDEPENDENT_AMBULATORY_CARE_PROVIDER_SITE_OTHER): Payer: Medicare Other | Admitting: Podiatry

## 2014-11-08 DIAGNOSIS — B351 Tinea unguium: Secondary | ICD-10-CM | POA: Diagnosis not present

## 2014-11-08 DIAGNOSIS — M79676 Pain in unspecified toe(s): Secondary | ICD-10-CM | POA: Diagnosis not present

## 2014-11-08 DIAGNOSIS — M79674 Pain in right toe(s): Secondary | ICD-10-CM

## 2014-11-08 DIAGNOSIS — M79675 Pain in left toe(s): Secondary | ICD-10-CM

## 2014-11-08 NOTE — Progress Notes (Signed)
Patient ID: Darlene [name], female   DOB: 1934-05-18, 80 y.o.   MRN: [id number] Complaint:  Visit Type: Patient returns to my office for continued preventative foot care services. Complaint: Patient states" my nails have grown long and thick and become painful to walk and wear shoes".  He presents for preventative foot care services. No changes to ROS  Podiatric Exam: Vascular: dorsalis pedis and posterior tibial pulses are palpable bilateral. Capillary return is immediate. Temperature gradient is WNL. Skin turgor WNL  Sensorium: Normal Semmes Weinstein monofilament test. Normal tactile sensation bilaterally. Nail Exam: Pt has thick disfigured discolored nails with subungual debris noted bilateral entire nail hallux through fifth toenails Ulcer Exam: There is no evidence of ulcer or pre-ulcerative changes or infection. Orthopedic Exam: Muscle tone and strength are WNL. No limitations in general ROM. No crepitus or effusions noted. Foot type and digits show no abnormalities. Bony prominences are unremarkable. Skin: No Porokeratosis. No infection or ulcers  Diagnosis:  Tinea unguium, Pain in right toe, pain in left toes  Treatment & Plan Procedures and Treatment: Consent by patient was obtained for treatment procedures. The patient understood the discussion of treatment and procedures well. All questions were answered thoroughly reviewed. Debridement of mycotic and hypertrophic toenails, 1 through 5 bilateral and clearing of subungual debris. No ulceration, no infection noted.  Return Visit-Office Procedure: Patient instructed to return to the office for a follow up visit 3 months for continued evaluation and treatment.

## 2014-11-12 ENCOUNTER — Encounter (HOSPITAL_COMMUNITY): Payer: Self-pay | Admitting: Emergency Medicine

## 2014-11-12 ENCOUNTER — Emergency Department (HOSPITAL_COMMUNITY)
Admission: EM | Admit: 2014-11-12 | Discharge: 2014-11-13 | Disposition: A | Discharge status: Home / Self Care | Payer: Medicare Other | Attending: Emergency Medicine | Admitting: Emergency Medicine

## 2014-11-12 DIAGNOSIS — Z9861 Coronary angioplasty status: Secondary | ICD-10-CM | POA: Diagnosis not present

## 2014-11-12 DIAGNOSIS — Z853 Personal history of malignant neoplasm of breast: Secondary | ICD-10-CM | POA: Diagnosis not present

## 2014-11-12 DIAGNOSIS — Z7982 Long term (current) use of aspirin: Secondary | ICD-10-CM | POA: Insufficient documentation

## 2014-11-12 DIAGNOSIS — Z79899 Other long term (current) drug therapy: Secondary | ICD-10-CM | POA: Insufficient documentation

## 2014-11-12 DIAGNOSIS — Z8659 Personal history of other mental and behavioral disorders: Secondary | ICD-10-CM | POA: Diagnosis not present

## 2014-11-12 DIAGNOSIS — Y93K1 Activity, walking an animal: Secondary | ICD-10-CM | POA: Diagnosis not present

## 2014-11-12 DIAGNOSIS — E039 Hypothyroidism, unspecified: Secondary | ICD-10-CM | POA: Diagnosis not present

## 2014-11-12 DIAGNOSIS — K219 Gastro-esophageal reflux disease without esophagitis: Secondary | ICD-10-CM | POA: Insufficient documentation

## 2014-11-12 DIAGNOSIS — Y998 Other external cause status: Secondary | ICD-10-CM | POA: Diagnosis not present

## 2014-11-12 DIAGNOSIS — Z87891 Personal history of nicotine dependence: Secondary | ICD-10-CM | POA: Diagnosis not present

## 2014-11-12 DIAGNOSIS — Z86010 Personal history of colonic polyps: Secondary | ICD-10-CM | POA: Diagnosis not present

## 2014-11-12 DIAGNOSIS — W01198A Fall on same level from slipping, tripping and stumbling with subsequent striking against other object, initial encounter: Secondary | ICD-10-CM | POA: Insufficient documentation

## 2014-11-12 DIAGNOSIS — E785 Hyperlipidemia, unspecified: Secondary | ICD-10-CM | POA: Insufficient documentation

## 2014-11-12 DIAGNOSIS — Z862 Personal history of diseases of the blood and blood-forming organs and certain disorders involving the immune mechanism: Secondary | ICD-10-CM | POA: Diagnosis not present

## 2014-11-12 DIAGNOSIS — S0121XA Laceration without foreign body of nose, initial encounter: Secondary | ICD-10-CM | POA: Diagnosis not present

## 2014-11-12 DIAGNOSIS — Z8572 Personal history of non-Hodgkin lymphomas: Secondary | ICD-10-CM | POA: Insufficient documentation

## 2014-11-12 DIAGNOSIS — M810 Age-related osteoporosis without current pathological fracture: Secondary | ICD-10-CM | POA: Insufficient documentation

## 2014-11-12 DIAGNOSIS — Y92007 Garden or yard of unspecified non-institutional (private) residence as the place of occurrence of the external cause: Secondary | ICD-10-CM | POA: Diagnosis not present

## 2014-11-12 DIAGNOSIS — J449 Chronic obstructive pulmonary disease, unspecified: Secondary | ICD-10-CM | POA: Insufficient documentation

## 2014-11-12 DIAGNOSIS — W19XXXA Unspecified fall, initial encounter: Secondary | ICD-10-CM

## 2014-11-12 DIAGNOSIS — Z23 Encounter for immunization: Secondary | ICD-10-CM | POA: Insufficient documentation

## 2014-11-12 DIAGNOSIS — S0993XA Unspecified injury of face, initial encounter: Secondary | ICD-10-CM | POA: Diagnosis present

## 2014-11-12 DIAGNOSIS — Y92009 Unspecified place in unspecified non-institutional (private) residence as the place of occurrence of the external cause: Secondary | ICD-10-CM

## 2014-11-12 MED ORDER — TETANUS-DIPHTH-ACELL PERTUSSIS 5-2.5-18.5 LF-MCG/0.5 IM SUSP
0.5000 mL | Freq: Once | INTRAMUSCULAR | Status: AC
  Administered 2014-11-12: 0.5 mL via INTRAMUSCULAR
  Filled 2014-11-12: qty 0.5

## 2014-11-12 NOTE — ED Provider Notes (Signed)
CSN: [id number]     Arrival date & time 11/12/14  2042 History   First MD Initiated Contact with Patient 11/12/14 2325     Chief Complaint  Patient presents with  . Facial Injury     (Consider location/radiation/quality/duration/timing/severity/associated sxs/prior Treatment) HPI  This is a 80 year old female who was walking her dog in the yard earlier this evening. She tripped over a tree stump and fell striking her nose. She has a small laceration to the right side of her nose. There was profuse bleeding but this has been controlled with pressure. There is minimal pain associated with it. She also struck her right knee but this is minimally painful and she is able to ambulate without difficulty; she only mentions this because she has a right knee replacement. She denies neck or other injury.  Past Medical History  Diagnosis Date  . Chronic ITP (idiopathic thrombocytopenia) 07/06/2011  . Hyperlipidemia     takes Lovastatin daily  . Right knee DJD   . Cancer of breast   . Lymphoma   . Breast cancer, stage 1, estrogen receptor negative 07/06/2011  . Lymphoma of lymph nodes of head, face, and/or neck 07/06/2011  . Lymphoma     B cell   . Constipation     takes Colace daily  . GERD (gastroesophageal reflux disease)     takes Nexium daily  . History of palpitations     takes Metoprolol daily  . Joint pain   . Joint swelling   . COPD (chronic obstructive pulmonary disease)     mild  . History of vertigo   . Osteopenia   . History of colon polyps   . Nocturia   . History of depression     received shock treatments   . Thyroid disease   . Hypothyroid 07/06/2011    takes SYnthroid daily  . Osteoporosis     hx of and take Reclast  . Umbilical hernia    Past Surgical History  Procedure Laterality Date  . Knee surgery    . Adenoidectomy      as a child  . Tonsillectomy      as a child  . Breast surgery Right   . Right  breast surgery      d/t breast cancer  . Bunionectomy Right    . Foot surgery Right   . Turbinate reduction    . Cardiac catheterization  2014  . Colonoscopy    . Total knee arthroplasty Right 06/19/2013    Procedure: TOTAL KNEE ARTHROPLASTY;  Surgeon: Lorn Junes, MD;  Location: [place];  Service: Orthopedics;  Laterality: Right;  . Upper gastrointestinal endoscopy    . Left heart catheterization with coronary angiogram N/A 12/05/2012    Procedure: LEFT HEART CATHETERIZATION WITH CORONARY ANGIOGRAM;  Surgeon: [name] Sine, MD;  Location: [facility] CATH LAB;  Service: Cardiovascular;  Laterality: N/A;   Family History  Problem Relation Age of Onset  . Hyperlipidemia Father   . Heart disease Father   . Heart disease Sister   . Cancer Brother   . Diabetes Son   . Heart disease Maternal Grandfather   . Colon cancer Neg Hx    History  Substance Use Topics  . Smoking status: Former Smoker -- 0.30 packs/day    Types: Cigarettes  . Smokeless tobacco: Never Used     Comment: quit smoking 1985  . Alcohol Use: 0.0 oz/week     Comment: occasionally wine   OB History  No data available     Review of Systems  All other systems reviewed and are negative.   Allergies  Oxycodone; Codeine; Doxycycline; Meperidine and related; and Sulfa antibiotics  Home Medications   Prior to Admission medications   Medication Sig Start Date End Date Taking? Authorizing Provider  acetaminophen (TYLENOL) 500 MG tablet Take 1,000 mg by mouth 2 (two) times daily.    Historical Provider, MD  aspirin 81 MG tablet Take 81 mg by mouth daily.    Historical Provider, MD  Calcium Carbonate-Vitamin D (CALCIUM 600+D) 600-200 MG-UNIT TABS Take 1 tablet by mouth 2 (two) times daily.     Historical Provider, MD  cholecalciferol (VITAMIN D) 1000 UNITS tablet Take 1,000 Units by mouth daily.    Historical Provider, MD  citalopram (CELEXA) 10 MG tablet Take 40 mg by mouth daily.     Historical Provider, MD  Cyanocobalamin (VITAMIN B-12 PO) Take 1,000 mcg by mouth daily.     Historical Provider, MD  esomeprazole (NEXIUM) 40 MG capsule Take 40 mg by mouth 2 (two) times daily before a meal.     Historical Provider, MD  famciclovir (FAMVIR) 125 MG tablet Take 250 mg by mouth 2 (two) times daily as needed. For 5 days as needed for outbreak    Historical Provider, MD  levothyroxine (SYNTHROID, LEVOTHROID) 75 MCG tablet Take 75 mcg by mouth daily.      Historical Provider, MD  lovastatin (MEVACOR) 20 MG tablet Take 20 mg by mouth at bedtime.      Historical Provider, MD  metoprolol succinate (TOPROL-XL) 25 MG 24 hr tablet Take 25 mg by mouth daily.  11/22/12   Historical Provider, MD  mometasone (ELOCON) 0.1 % cream Apply 1 application topically daily as needed (for itching).  10/07/12   Historical Provider, MD  Multiple Vitamins-Minerals (MULTIVITAMINS THER. W/MINERALS) TABS Take 1 tablet by mouth daily.      Historical Provider, MD  Polyethyl Glycol-Propyl Glycol (SYSTANE) 0.4-0.3 % SOLN Apply 2 drops to eye 2 (two) times daily as needed (for dry eyes).    Historical Provider, MD  Vitamin D, Ergocalciferol, (DRISDOL) 50000 UNITS CAPS Take 50,000 Units by mouth every 14 (fourteen) days.      Historical Provider, MD   BP 131/58 mmHg  Pulse 67  Temp(Src) 98 F (36.7 C) (Oral)  Resp 16  SpO2 97%   Physical Exam  General: Well-developed, well-nourished female in no acute distress; appearance consistent with age of record HENT: normocephalic; small, superficial laceration to right side of nose, no bony deformity or crepitus; no hemotympanum Eyes: pupils equal, round and reactive to light; extraocular muscles intact Neck: supple Heart: regular rate and rhythm; Lungs: clear to auscultation bilaterally Abdomen: soft; nondistended; nontender; bowel sounds present Extremities: Arthritic changes; pulses normal Neurologic: Awake, alert and oriented; motor function intact in all extremities and symmetric; no facial droop Skin: Warm and dry Psychiatric: Normal mood and  affect     ED Course  Procedures (including critical care time)   The patient's wound consists of primarily a small flap of devitalized tissue approximately 2 x 2 millimeters. The wound is superficial and does not appear amenable to primary closure. She was advised to provide local wound care and bandage it to prevent her eyeglasses from irritating it.  MDM     Shanon Rosser, MD 11/12/14 [phone]

## 2014-11-12 NOTE — ED Notes (Signed)
Patient able to ambulate from triage without assistance.

## 2014-11-12 NOTE — ED Notes (Signed)
Pt states she was walking in her yard and tripped over a root in the yard  Pt states she fell forward and hit her face on the ground  Pt has a small laceration to the right side of her nose  Pt states she also hit her right knee and has hx of knee replacement a year ago

## 2014-11-14 ENCOUNTER — Other Ambulatory Visit (HOSPITAL_COMMUNITY): Payer: Self-pay | Admitting: Interventional Radiology

## 2014-11-14 DIAGNOSIS — M79604 Pain in right leg: Secondary | ICD-10-CM

## 2014-11-14 DIAGNOSIS — I8390 Asymptomatic varicose veins of unspecified lower extremity: Secondary | ICD-10-CM

## 2014-11-14 DIAGNOSIS — R200 Anesthesia of skin: Secondary | ICD-10-CM

## 2014-11-16 DIAGNOSIS — J159 Unspecified bacterial pneumonia: Secondary | ICD-10-CM | POA: Diagnosis not present

## 2014-11-16 DIAGNOSIS — J449 Chronic obstructive pulmonary disease, unspecified: Secondary | ICD-10-CM | POA: Diagnosis not present

## 2014-11-16 DIAGNOSIS — F3341 Major depressive disorder, recurrent, in partial remission: Secondary | ICD-10-CM | POA: Diagnosis not present

## 2014-11-16 DIAGNOSIS — E785 Hyperlipidemia, unspecified: Secondary | ICD-10-CM | POA: Diagnosis not present

## 2014-11-16 DIAGNOSIS — C8591 Non-Hodgkin lymphoma, unspecified, lymph nodes of head, face, and neck: Secondary | ICD-10-CM | POA: Diagnosis not present

## 2014-11-16 DIAGNOSIS — D696 Thrombocytopenia, unspecified: Secondary | ICD-10-CM | POA: Diagnosis not present

## 2014-11-29 DIAGNOSIS — M79673 Pain in unspecified foot: Secondary | ICD-10-CM | POA: Diagnosis not present

## 2014-12-13 DIAGNOSIS — G629 Polyneuropathy, unspecified: Secondary | ICD-10-CM | POA: Diagnosis not present

## 2014-12-13 DIAGNOSIS — Z79899 Other long term (current) drug therapy: Secondary | ICD-10-CM | POA: Diagnosis not present

## 2014-12-19 ENCOUNTER — Ambulatory Visit
Admission: RE | Admit: 2014-12-19 | Discharge: 2014-12-19 | Disposition: A | Discharge status: Home / Self Care | Payer: Medicare Other | Source: Ambulatory Visit | Attending: Interventional Radiology | Admitting: Interventional Radiology

## 2014-12-19 DIAGNOSIS — R200 Anesthesia of skin: Secondary | ICD-10-CM

## 2014-12-19 DIAGNOSIS — I83813 Varicose veins of bilateral lower extremities with pain: Secondary | ICD-10-CM | POA: Diagnosis not present

## 2014-12-19 DIAGNOSIS — M79671 Pain in right foot: Secondary | ICD-10-CM | POA: Diagnosis not present

## 2014-12-19 DIAGNOSIS — M79604 Pain in right leg: Secondary | ICD-10-CM

## 2014-12-19 DIAGNOSIS — I8390 Asymptomatic varicose veins of unspecified lower extremity: Secondary | ICD-10-CM

## 2014-12-19 DIAGNOSIS — M79672 Pain in left foot: Secondary | ICD-10-CM | POA: Diagnosis not present

## 2014-12-19 NOTE — Consult Note (Signed)
Chief Complaint: Patient was seen in consultation today for bilateral foot pain Referring Physician(s): Kasia Trego  History of Present Illness: Darlene [name] is a 80 y.o. female known to our service from evaluation of bilateral lower extremity varicose veins in 2013, found to be secondary to superficial venous valvular incompetence and reflux, treated with bilateral greater saphenous vein endovascular ablation with durable response. In the interval, she had knee replacement surgery in February 2015. She now complains of worsening fairly severe bilateral foot pain. She has significant tenderness such that its uncomfortable to wear the graduated compression hose. No new visible varicose veins.  Past Medical History  Diagnosis Date  . Chronic ITP (idiopathic thrombocytopenia) 07/06/2011  . Hyperlipidemia     takes Lovastatin daily  . Right knee DJD   . Cancer of breast   . Lymphoma   . Breast cancer, stage 1, estrogen receptor negative 07/06/2011  . Lymphoma of lymph nodes of head, face, and/or neck 07/06/2011  . Lymphoma     B cell   . Constipation     takes Colace daily  . GERD (gastroesophageal reflux disease)     takes Nexium daily  . History of palpitations     takes Metoprolol daily  . Joint pain   . Joint swelling   . COPD (chronic obstructive pulmonary disease)     mild  . History of vertigo   . Osteopenia   . History of colon polyps   . Nocturia   . History of depression     received shock treatments   . Thyroid disease   . Hypothyroid 07/06/2011    takes SYnthroid daily  . Osteoporosis     hx of and take Reclast  . Umbilical hernia     Past Surgical History  Procedure Laterality Date  . Knee surgery    . Adenoidectomy      as a child  . Tonsillectomy      as a child  . Breast surgery Right   . Right  breast surgery      d/t breast cancer  . Bunionectomy Right   . Foot surgery Right   . Turbinate reduction    . Cardiac catheterization  2014  .  Colonoscopy    . Total knee arthroplasty Right 06/19/2013    Procedure: TOTAL KNEE ARTHROPLASTY;  Surgeon: Lorn Junes, MD;  Location: [place];  Service: Orthopedics;  Laterality: Right;  . Upper gastrointestinal endoscopy    . Left heart catheterization with coronary angiogram N/A 12/05/2012    Procedure: LEFT HEART CATHETERIZATION WITH CORONARY ANGIOGRAM;  Surgeon: [name] Sine, MD;  Location: [facility] CATH LAB;  Service: Cardiovascular;  Laterality: N/A;    Allergies: Oxycodone; Codeine; Doxycycline; Meperidine and related; and Sulfa antibiotics  Medications: Prior to Admission medications   Medication Sig Start Date End Date Taking? Authorizing Provider  acetaminophen (TYLENOL) 500 MG tablet Take 1,000 mg by mouth 2 (two) times daily.    Historical Provider, MD  aspirin 81 MG tablet Take 81 mg by mouth daily.    Historical Provider, MD  Calcium Carbonate-Vitamin D (CALCIUM 600+D) 600-200 MG-UNIT TABS Take 1 tablet by mouth 2 (two) times daily.     Historical Provider, MD  cholecalciferol (VITAMIN D) 1000 UNITS tablet Take 1,000 Units by mouth daily.    Historical Provider, MD  citalopram (CELEXA) 10 MG tablet Take 40 mg by mouth daily.     Historical Provider, MD  Cyanocobalamin (VITAMIN B-12 PO) Take  1,000 mcg by mouth daily.    Historical Provider, MD  esomeprazole (NEXIUM) 40 MG capsule Take 40 mg by mouth 2 (two) times daily before a meal.     Historical Provider, MD  famciclovir (FAMVIR) 125 MG tablet Take 250 mg by mouth 2 (two) times daily as needed. For 5 days as needed for outbreak    Historical Provider, MD  levothyroxine (SYNTHROID, LEVOTHROID) 75 MCG tablet Take 75 mcg by mouth daily.      Historical Provider, MD  lovastatin (MEVACOR) 20 MG tablet Take 20 mg by mouth at bedtime.      Historical Provider, MD  metoprolol succinate (TOPROL-XL) 25 MG 24 hr tablet Take 25 mg by mouth daily.  11/22/12   Historical Provider, MD  mometasone (ELOCON) 0.1 % cream Apply 1 application  topically daily as needed (for itching).  10/07/12   Historical Provider, MD  Multiple Vitamins-Minerals (MULTIVITAMINS THER. W/MINERALS) TABS Take 1 tablet by mouth daily.      Historical Provider, MD  Polyethyl Glycol-Propyl Glycol (SYSTANE) 0.4-0.3 % SOLN Apply 2 drops to eye 2 (two) times daily as needed (for dry eyes).    Historical Provider, MD  Vitamin D, Ergocalciferol, (DRISDOL) 50000 UNITS CAPS Take 50,000 Units by mouth every 14 (fourteen) days.      Historical Provider, MD     Family History  Problem Relation Age of Onset  . Hyperlipidemia Father   . Heart disease Father   . Heart disease Sister   . Cancer Brother   . Diabetes Son   . Heart disease Maternal Grandfather   . Colon cancer Neg Hx     Social History   Social History  . Marital Status: Married    Spouse Name: N/A  . Number of Children: N/A  . Years of Education: N/A   Social History Main Topics  . Smoking status: Former Smoker -- 0.30 packs/day    Types: Cigarettes  . Smokeless tobacco: Never Used     Comment: quit smoking 1985  . Alcohol Use: 0.0 oz/week     Comment: occasionally wine  . Drug Use: No  . Sexual Activity: Yes    Birth Control/ Protection: Post-menopausal   Other Topics Concern  . Not on file   Social History Narrative    ECOG Status: 1 - Symptomatic but completely ambulatory   Review of Systems  Review of Systems: A 12 point ROS discussed and pertinent positives are indicated in the HPI above.  All other systems are negative. Vital Signs: There were no vitals taken for this visit.  Physical Exam  Constitutional: She is oriented to person, place, and time. She appears well-developed and well-nourished. No distress.  HENT:  Head: Normocephalic and atraumatic.  Eyes: EOM are normal. No scleral icterus.  Pulmonary/Chest: Effort normal. No respiratory distress.  Neurological: She is alert and oriented to person, place, and time.  Skin: She is not diaphoretic.  Psychiatric:  She has a normal mood and affect. Her behavior is normal. Judgment and thought content normal.  Nursing note and vitals reviewed.   Mallampati Score:     Imaging: Venous ultrasound demonstrates no lower extremity DVT. Durable closure of bilateral greater saphenous veins. No residual or recurrent superficial venous reflux.   Labs:  CBC:  Recent Labs  07/12/14 1247  WBC 7.8  HGB 11.7  HCT 35.8  PLT 187    COAGS: No results for input(s): INR, APTT in the last 8760 hours.  BMP:  Recent Labs  07/12/14 1248  NA 134*  K 4.0  CO2 23  GLUCOSE 128  BUN 13.6  CALCIUM 9.6  CREATININE 0.7    LIVER FUNCTION TESTS:  Recent Labs  07/12/14 1248  BILITOT 0.66  AST 11  ALT 15  ALKPHOS 49  PROT 6.4  ALBUMIN 3.8    TUMOR MARKERS: No results for input(s): AFPTM, CEA, CA199, CHROMGRNA in the last 8760 hours.  Assessment and Plan:  My impression is that she has had a durable response to endovascular ablation of bilateral greater saphenous veins for her previously symptomatic varicose veins. There is no evidence of residual or recurrent superficial venous incompetence or reflux. The deep venous systems remain normal in both lower extremities. I suspect her bilateral foot pain may be  neuropathy. There is no suggestion of any venous etiology of her symptoms. I recommend she not use the compression hose until tenderness has resolved.  Thank you for this interesting consult.  I greatly enjoyed meeting Darlene [name] and look forward to participating in their care.  A copy of this report was sent to the requesting provider on this date.  Signed: Caliya Narine III, DAYNE Sayla Golonka 12/19/2014, 3:51 PM   I spent a total of    15 Minutes in face to face in clinical consultation, greater than 50% of which was counseling/coordinating care for foot pain.

## 2014-12-21 DIAGNOSIS — M810 Age-related osteoporosis without current pathological fracture: Secondary | ICD-10-CM | POA: Diagnosis not present

## 2014-12-21 DIAGNOSIS — Z01419 Encounter for gynecological examination (general) (routine) without abnormal findings: Secondary | ICD-10-CM | POA: Diagnosis not present

## 2014-12-21 DIAGNOSIS — Z124 Encounter for screening for malignant neoplasm of cervix: Secondary | ICD-10-CM | POA: Diagnosis not present

## 2014-12-21 DIAGNOSIS — K429 Umbilical hernia without obstruction or gangrene: Secondary | ICD-10-CM | POA: Diagnosis not present

## 2014-12-25 DIAGNOSIS — F17211 Nicotine dependence, cigarettes, in remission: Secondary | ICD-10-CM | POA: Diagnosis not present

## 2014-12-25 DIAGNOSIS — G629 Polyneuropathy, unspecified: Secondary | ICD-10-CM | POA: Diagnosis not present

## 2014-12-27 ENCOUNTER — Ambulatory Visit
Admission: RE | Admit: 2014-12-27 | Discharge: 2014-12-27 | Disposition: A | Discharge status: Home / Self Care | Payer: Medicare Other | Source: Ambulatory Visit | Attending: Neurology | Admitting: Neurology

## 2014-12-27 ENCOUNTER — Encounter: Payer: Self-pay | Admitting: Neurology

## 2014-12-27 ENCOUNTER — Telehealth: Payer: Self-pay | Admitting: Neurology

## 2014-12-27 ENCOUNTER — Ambulatory Visit (INDEPENDENT_AMBULATORY_CARE_PROVIDER_SITE_OTHER): Payer: Medicare Other | Admitting: Neurology

## 2014-12-27 VITALS — BP 118/68 | HR 74 | Ht 61.0 in | Wt 168.0 lb

## 2014-12-27 DIAGNOSIS — M79671 Pain in right foot: Secondary | ICD-10-CM

## 2014-12-27 NOTE — Progress Notes (Signed)
PATIENT: Darlene [name] DOB: 21-Mar-1935  Chief Complaint  Patient presents with  . Peripheral Neuropathy    She is having burning pain, tingling and swelling in her bilateral feet.  Right side is worse than left.  Her symptoms have been worsening over the last month.     HISTORICAL  Darlene [name] is a pleasant 80 years old right-handed female, alone at today's clinical visit, seen in refer by Dr. Thressa Sheller for evaluation of right foot pain, swelling  I reviewed summarized most recent office visit December 13 2014, She has history of hypothyroidism, on supplement, major depression, COPD, history of tobacco use, trigeminal neuralgia 1975,  chronic thrombocytopenia, history of right breast cancer in 08/31/92, status post right lobectomy, followed by radiation chemotherapy, low-grade B-cell lymphoma, under close supervision, occasionally prednisone treatment. Her husband who suffered many years Parkinson's disease, just passed away in 09-01-14   Laboratory evaluation, vitamin B12 1 1 1  1, negative RPR, ANA, rheumatoid factor, yes are  She tripped and fell in June 2016, complains of mild neck pain pain since the fall, she also complains of worsening right lateral 3 fingers paresthesia, worsening gait difficulty, urinary incontinence  She began to notice bilateral feet paresthesia since 08-31-2013, starting from bilateral plantar surface, now involving whole feet, right worse than left, she also had a history of right knee replacement February 2015, right bunionectomy in the past,   She always has mild right foot swelling, getting worse over the past 1 week, tenderness at right lateral foot, she has been taking gabapentin 300 mg twice a day without help, amitriptyline 25 mg every night, with worsening depression, she was started on Lyrica 50 mg 3 times a day, which seems to be helpful.  Doppler study of bilateral lower extremity December 19 2014, no evidence of DVT   REVIEW OF SYSTEMS: Full  14 system review of systems performed and notable only for numbness, anxiety, not enough sleep, joint pain, swelling, achy muscles, hearing loss  ALLERGIES: Allergies  Allergen Reactions  . Oxycodone Other (See Comments)    Made her depressed  . Codeine Itching           . Doxycycline Swelling and Other (See Comments)    Burns throat also   . Meperidine And Related Itching and Rash    Makes cheeks red also  . Sulfa Antibiotics Itching    HOME MEDICATIONS: Current Outpatient Prescriptions  Medication Sig Dispense Refill  . acetaminophen (TYLENOL) 500 MG tablet Take 1,000 mg by mouth 2 (two) times daily.    Marland Kitchen aspirin 81 MG tablet Take 81 mg by mouth daily.    . Calcium Carbonate-Vitamin D (CALCIUM 600+D) 600-200 MG-UNIT TABS Take 1 tablet by mouth 2 (two) times daily.     . cholecalciferol (VITAMIN D) 1000 UNITS tablet Take 1,000 Units by mouth daily.    . citalopram (CELEXA) 10 MG tablet Take 40 mg by mouth daily.     . Cyanocobalamin (VITAMIN B-12 PO) Take 1,000 mcg by mouth daily.    Marland Kitchen esomeprazole (NEXIUM) 40 MG capsule Take 40 mg by mouth 2 (two) times daily before a meal.     . famciclovir (FAMVIR) 125 MG tablet Take 250 mg by mouth 2 (two) times daily as needed. For 5 days as needed for outbreak    . levothyroxine (SYNTHROID, LEVOTHROID) 75 MCG tablet Take 75 mcg by mouth daily.      Marland Kitchen lovastatin (MEVACOR) 20 MG tablet Take 20 mg  by mouth at bedtime.      Marland Kitchen LYRICA 50 MG capsule She is titrating up to three times daily.    . metoprolol succinate (TOPROL-XL) 25 MG 24 hr tablet Take 25 mg by mouth daily.     . mometasone (ELOCON) 0.1 % cream Apply 1 application topically daily as needed (for itching).     . Multiple Vitamins-Minerals (MULTIVITAMINS THER. W/MINERALS) TABS Take 1 tablet by mouth daily.      Vladimir Faster Glycol-Propyl Glycol (SYSTANE) 0.4-0.3 % SOLN Apply 2 drops to eye 2 (two) times daily as needed (for dry eyes).    . Vitamin D, Ergocalciferol, (DRISDOL) 50000  UNITS CAPS Take 50,000 Units by mouth every 14 (fourteen) days.       No current facility-administered medications for this visit.    PAST MEDICAL HISTORY: Past Medical History  Diagnosis Date  . Chronic ITP (idiopathic thrombocytopenia) 07/06/2011  . Hyperlipidemia     takes Lovastatin daily  . Right knee DJD   . Cancer of breast   . Lymphoma   . Breast cancer, stage 1, estrogen receptor negative 07/06/2011  . Lymphoma of lymph nodes of head, face, and/or neck 07/06/2011  . Lymphoma     B cell   . Constipation     takes Colace daily  . GERD (gastroesophageal reflux disease)     takes Nexium daily  . History of palpitations     takes Metoprolol daily  . Joint pain   . Joint swelling   . COPD (chronic obstructive pulmonary disease)     mild  . History of vertigo   . Osteopenia   . History of colon polyps   . Nocturia   . History of depression     received shock treatments   . Thyroid disease   . Hypothyroid 07/06/2011    takes SYnthroid daily  . Osteoporosis     hx of and take Reclast  . Umbilical hernia   . Peripheral neuropathy     PAST SURGICAL HISTORY: Past Surgical History  Procedure Laterality Date  . Knee surgery    . Adenoidectomy      as a child  . Tonsillectomy      as a child  . Breast surgery Right   . Right  breast surgery      d/t breast cancer  . Bunionectomy Right   . Foot surgery Right   . Turbinate reduction    . Cardiac catheterization  2014  . Colonoscopy    . Total knee arthroplasty Right 06/19/2013    Procedure: TOTAL KNEE ARTHROPLASTY;  Surgeon: Lorn Junes, MD;  Location: [place];  Service: Orthopedics;  Laterality: Right;  . Upper gastrointestinal endoscopy    . Left heart catheterization with coronary angiogram N/A 12/05/2012    Procedure: LEFT HEART CATHETERIZATION WITH CORONARY ANGIOGRAM;  Surgeon: [name] Sine, MD;  Location: [facility] CATH LAB;  Service: Cardiovascular;  Laterality: N/A;    FAMILY HISTORY: Family History  Problem  Relation Age of Onset  . Hyperlipidemia Father   . Heart disease Father   . Heart disease Sister   . Cancer Brother   . Diabetes Son   . Heart disease Maternal Grandfather   . Colon cancer Neg Hx     SOCIAL HISTORY:  Social History   Social History  . Marital Status: Married    Spouse Name: N/A  . Number of Children: 2  . Years of Education: HS   Occupational History  .  Retired    Social History Main Topics  . Smoking status: Former Smoker -- 0.30 packs/day    Types: Cigarettes  . Smokeless tobacco: Never Used     Comment: quit smoking 1985  . Alcohol Use: No  . Drug Use: No  . Sexual Activity: Yes    Birth Control/ Protection: Post-menopausal   Other Topics Concern  . Not on file   Social History Narrative   Lives at home alone.   Right-handed.   4 cups of caffeine daily.     PHYSICAL EXAM   Filed Vitals:   12/27/14 1446  BP: 118/68  Pulse: 74  Height: 5\' 1"  (1.549 m)  Weight: 168 lb (76.204 kg)    Not recorded      Body mass index is 31.76 kg/(m^2).  PHYSICAL EXAMNIATION:  Gen: NAD, conversant, well nourised, obese, well groomed                     Cardiovascular: Regular rate rhythm, no peripheral edema, warm, nontender. Eyes: Conjunctivae clear without exudates or hemorrhage Neck: Supple, no carotid bruise. Pulmonary: Clear to auscultation bilaterally  Musculoskeletal: Right foot erythematous pitting edema, tenderness at right lateral foot, no significant right ankle tenderness upon deep palpitation, well-healed right bunionectomy scar, no skin breakdown noticed.   NEUROLOGICAL EXAM:  MENTAL STATUS: Speech:    Speech is normal; fluent and spontaneous with normal comprehension.  Cognition:     Orientation to time, place and person     Normal recent and remote memory     Normal Attention span and concentration     Normal Language, naming, repeating,spontaneous speech     Fund of knowledge   CRANIAL NERVES: CN II: Visual fields are full  to confrontation. Fundoscopic exam is normal with sharp discs and no vascular changes. Pupils are round equal and briskly reactive to light. CN III, IV, VI: extraocular movement are normal. No ptosis. CN V: Facial sensation is intact to pinprick in all 3 divisions bilaterally. Corneal responses are intact.  CN VII: Face is symmetric with normal eye closure and smile. CN VIII: Hearing is normal to rubbing fingers CN IX, X: Palate elevates symmetrically. Phonation is normal. CN XI: Head turning and shoulder shrug are intact CN XII: Tongue is midline with normal movements and no atrophy.  MOTOR: There is no pronator drift of out-stretched arms. Muscle bulk and tone are normal. Muscle strength is normal.  REFLEXES: Reflexes are  3 and  symmetric at the biceps, triceps, knees, and ankles. Plantar responses are extensor bilaterally  SENSORY: Length dependent decreased to light touch, pinprick, vibratory sensation to ankle level  COORDINATION: Rapid alternating movements and fine finger movements are intact. There is no dysmetria on finger-to-nose and heel-knee-shin.    GAIT/STANCE: She needed help to get up from seated position, wide-based, cautious, also limited by her right foot pain, antalgic   DIAGNOSTIC DATA (LABS, IMAGING, TESTING) - I reviewed patient records, labs, notes, testing and imaging myself where available.   ASSESSMENT AND PLAN  Darlene [name] is a 80 y.o. female with past medical history of right breast cancer, status post lobectomy, followed by chemoradiation therapy in 1994, presented with worsening gait difficulty, neck pain, radiating pain to right lateral 3 fingers. Hyperreflexia on examinations  Cervical spondylitic myelopathy  MRI of cervical spine Right foot pain, swelling, tenderness upon deep palpitation  X-ray of right foot, I will call her report  No evidence of DVT based on Doppler study in  December 19 2014    Marcial Pacas, M.D. Ph.D.  [facility] [address], [place], [place] [address] Ph: [phone] Fax: [phone]  CC: Thressa Sheller, MD

## 2014-12-27 NOTE — Telephone Encounter (Signed)
I have called her daughter at two listed numbers, was not able to reach her,   I also called patient home, there was no evidence of right foot fracture.  No definite fracture is seen.  Mild cortical irregularity the medial base of the 3rd proximal phalanx, possibly the sequela of prior trauma.  Degenerative changes, as above.  Mild diffuse soft tissue swelling.

## 2015-01-08 DIAGNOSIS — G629 Polyneuropathy, unspecified: Secondary | ICD-10-CM | POA: Diagnosis not present

## 2015-01-08 DIAGNOSIS — R600 Localized edema: Secondary | ICD-10-CM | POA: Diagnosis not present

## 2015-01-10 DIAGNOSIS — Z23 Encounter for immunization: Secondary | ICD-10-CM | POA: Diagnosis not present

## 2015-01-10 DIAGNOSIS — G629 Polyneuropathy, unspecified: Secondary | ICD-10-CM | POA: Diagnosis not present

## 2015-01-10 DIAGNOSIS — M79671 Pain in right foot: Secondary | ICD-10-CM | POA: Diagnosis not present

## 2015-01-10 DIAGNOSIS — R609 Edema, unspecified: Secondary | ICD-10-CM | POA: Diagnosis not present

## 2015-01-11 ENCOUNTER — Ambulatory Visit
Admission: RE | Admit: 2015-01-11 | Discharge: 2015-01-11 | Disposition: A | Discharge status: Home / Self Care | Payer: Medicare Other | Source: Ambulatory Visit | Attending: Neurology | Admitting: Neurology

## 2015-01-11 DIAGNOSIS — R269 Unspecified abnormalities of gait and mobility: Secondary | ICD-10-CM | POA: Diagnosis not present

## 2015-01-11 DIAGNOSIS — M79671 Pain in right foot: Secondary | ICD-10-CM

## 2015-01-14 ENCOUNTER — Telehealth: Payer: Self-pay | Admitting: Neurology

## 2015-01-14 NOTE — Telephone Encounter (Signed)
Please call patient, MRI of the cervical spine showed multilevel degenerative disc disease, most severe at C4-5, C5-6, there is evidence of mild to moderate foraminal stenosis, no evidence of spinal cord compression  This is an abnormal MRI of the cervical spine showing multilevel degenerative changes as detailed above. The most significant findings are: 1. At C4-C5 there is loss of disc height associated with edema within the endplates. There is moderate bilateral foraminal narrowing. Although there is no definite nerve root compression there is some encroachment upon both of the exiting C5 nerve roots. 2. At C5-C6 there or degenerative changes causing moderate right foraminal narrowing. Although there is no nerve root compression there is some encroachment upon the exiting C6 nerve root. 3. There are milder degenerative changes at C3-C3, C3-C4 and C6-C7 that did not lead to any nerve root impingement. 4. The spinal cord appears normal.

## 2015-01-14 NOTE — Telephone Encounter (Signed)
Spoke to patient - aware of results and will keep follow up appt for further discussion.

## 2015-01-15 NOTE — Telephone Encounter (Signed)
error 

## 2015-01-17 DIAGNOSIS — G629 Polyneuropathy, unspecified: Secondary | ICD-10-CM | POA: Diagnosis not present

## 2015-01-17 DIAGNOSIS — S80219A Abrasion, unspecified knee, initial encounter: Secondary | ICD-10-CM | POA: Diagnosis not present

## 2015-01-17 DIAGNOSIS — M79671 Pain in right foot: Secondary | ICD-10-CM | POA: Diagnosis not present

## 2015-01-17 DIAGNOSIS — R609 Edema, unspecified: Secondary | ICD-10-CM | POA: Diagnosis not present

## 2015-01-28 ENCOUNTER — Encounter: Payer: Self-pay | Admitting: Neurology

## 2015-01-28 ENCOUNTER — Ambulatory Visit (INDEPENDENT_AMBULATORY_CARE_PROVIDER_SITE_OTHER): Payer: Medicare Other | Admitting: Neurology

## 2015-01-28 VITALS — BP 134/78 | HR 72 | Ht 61.0 in | Wt 169.0 lb

## 2015-01-28 DIAGNOSIS — M79671 Pain in right foot: Secondary | ICD-10-CM | POA: Diagnosis not present

## 2015-01-28 DIAGNOSIS — R269 Unspecified abnormalities of gait and mobility: Secondary | ICD-10-CM

## 2015-01-28 NOTE — Progress Notes (Signed)
Chief Complaint  Patient presents with  . Cervical spondylitic myelopathy    She is here to discuss the results of her MRI.  Marland Kitchen Peripheral Neuropathy    Feels the burning sensation is better in her feet.  She is having problems with right-sided heel pain.      PATIENT: Darlene [name] DOB: 20-Jun-1934  Chief Complaint  Patient presents with  . Cervical spondylitic myelopathy    She is here to discuss the results of her MRI.  Marland Kitchen Peripheral Neuropathy    Feels the burning sensation is better in her feet.  She is having problems with right-sided heel pain.     HISTORICAL  Darlene [name] is a pleasant 80 years old right-handed female, alone at today's clinical visit, seen in refer by Dr. Thressa Sheller for evaluation of right foot pain, swelling  I reviewed summarized most recent office visit December 13 2014, She has history of hypothyroidism, on supplement, major depression, COPD, history of tobacco use, trigeminal neuralgia 1975,  chronic thrombocytopenia, history of right breast cancer in 1994, status post right lobectomy, followed by radiation chemotherapy, low-grade B-cell lymphoma, under close supervision, occasionally prednisone treatment. Her husband who suffered many years Parkinson's disease, just passed away in 2014-07-30   Laboratory evaluation, vitamin B12 1 1 1  1, negative RPR, ANA, rheumatoid factor, yes are  She tripped and fell in June 2016, complains of mild neck pain pain since the fall, she also complains of worsening right lateral 3 fingers paresthesia, worsening gait difficulty, urinary incontinence  She began to notice bilateral feet paresthesia since 2015, starting from bilateral plantar surface, now involving whole feet, right worse than left, she also had a history of right knee replacement February 2015, right bunionectomy in the past,   She always has mild right foot swelling, getting worse over the past 1 week, tenderness at right lateral foot, she has been  taking gabapentin 300 mg twice a day without help, amitriptyline 25 mg every night, with worsening depression, she was started on Lyrica 50 mg 3 times a day, which seems to be helpful.  Doppler study of bilateral lower extremity December 19 2014, no evidence of DVT   UPDATE Sep 26th 2016: We have reviewed MRI of the cervical spine:  multilevel degenerative changes, C4-C5 there is loss of disc height associated with edema within the endplates. There is moderate bilateral foraminal narrowing. Although there is no definite nerve root compression there is some encroachment upon both of the exiting C5 nerve roots.  C5-C6 there or degenerative changes causing moderate right foraminal narrowing. Although there is no nerve root compression there is some encroachment upon the exiting C6 nerve root. There are milder degenerative changes at C3-C3, C3-C4 and C6-C7 that did not lead to any nerve root impingement.  The spinal cord appears normal.  She continue complains of left heel pain, right foot pain, swelling has much improved, she only has mild neck pain, gait difficulty contributed to her left foot pain, multiple joints pain, no bowel and bladder incontinence   REVIEW OF SYSTEMS: Full 14 system review of systems performed and notable only for  as above  ALLERGIES: Allergies  Allergen Reactions  . Oxycodone Other (See Comments)    Made her depressed  . Codeine Itching           . Doxycycline Swelling and Other (See Comments)    Burns throat also   . Meperidine And Related Itching and Rash    Makes cheeks  red also  . Sulfa Antibiotics Itching    HOME MEDICATIONS: Current Outpatient Prescriptions  Medication Sig Dispense Refill  . acetaminophen (TYLENOL) 500 MG tablet Take 1,000 mg by mouth 2 (two) times daily.    Marland Kitchen amoxicillin (AMOXIL) 500 MG capsule Take 500 mg by mouth. Take 4 capsules prior to dental procedures.    Marland Kitchen aspirin 81 MG tablet Take 81 mg by mouth daily.    . Calcium  Carbonate-Vitamin D (CALCIUM 600+D) 600-200 MG-UNIT TABS Take 1 tablet by mouth 2 (two) times daily.     . cholecalciferol (VITAMIN D) 1000 UNITS tablet Take 1,000 Units by mouth daily.    . citalopram (CELEXA) 40 MG tablet Take 40 mg by mouth daily.    . Cyanocobalamin (VITAMIN B-12 PO) Take 1,000 mcg by mouth daily.    Marland Kitchen esomeprazole (NEXIUM) 40 MG capsule Take 40 mg by mouth 2 (two) times daily before a meal.     . famciclovir (FAMVIR) 125 MG tablet Take 250 mg by mouth 2 (two) times daily as needed. For 5 days as needed for outbreak    . levothyroxine (SYNTHROID, LEVOTHROID) 75 MCG tablet Take 75 mcg by mouth daily.      Marland Kitchen lovastatin (MEVACOR) 20 MG tablet Take 20 mg by mouth at bedtime.      Marland Kitchen LYRICA 50 MG capsule She is titrating up to three times daily.    . metoprolol succinate (TOPROL-XL) 25 MG 24 hr tablet Take 25 mg by mouth daily.     . mometasone (ELOCON) 0.1 % cream Apply 1 application topically daily as needed (for itching).     . Multiple Vitamins-Minerals (MULTIVITAMINS THER. W/MINERALS) TABS Take 1 tablet by mouth daily.      Vladimir Faster Glycol-Propyl Glycol (SYSTANE) 0.4-0.3 % SOLN Apply 2 drops to eye 2 (two) times daily as needed (for dry eyes).    . Vitamin D, Ergocalciferol, (DRISDOL) 50000 UNITS CAPS Take 50,000 Units by mouth every 14 (fourteen) days.       No current facility-administered medications for this visit.    PAST MEDICAL HISTORY: Past Medical History  Diagnosis Date  . Chronic ITP (idiopathic thrombocytopenia) 07/06/2011  . Hyperlipidemia     takes Lovastatin daily  . Right knee DJD   . Cancer of breast   . Lymphoma   . Breast cancer, stage 1, estrogen receptor negative 07/06/2011  . Lymphoma of lymph nodes of head, face, and/or neck 07/06/2011  . Lymphoma     B cell   . Constipation     takes Colace daily  . GERD (gastroesophageal reflux disease)     takes Nexium daily  . History of palpitations     takes Metoprolol daily  . Joint pain   . Joint  swelling   . COPD (chronic obstructive pulmonary disease)     mild  . History of vertigo   . Osteopenia   . History of colon polyps   . Nocturia   . History of depression     received shock treatments   . Thyroid disease   . Hypothyroid 07/06/2011    takes SYnthroid daily  . Osteoporosis     hx of and take Reclast  . Umbilical hernia   . Peripheral neuropathy     PAST SURGICAL HISTORY: Past Surgical History  Procedure Laterality Date  . Knee surgery    . Adenoidectomy      as a child  . Tonsillectomy      as a  child  . Breast surgery Right   . Right  breast surgery      d/t breast cancer  . Bunionectomy Right   . Foot surgery Right   . Turbinate reduction    . Cardiac catheterization  2014  . Colonoscopy    . Total knee arthroplasty Right 06/19/2013    Procedure: TOTAL KNEE ARTHROPLASTY;  Surgeon: Lorn Junes, MD;  Location: [place];  Service: Orthopedics;  Laterality: Right;  . Upper gastrointestinal endoscopy    . Left heart catheterization with coronary angiogram N/A 12/05/2012    Procedure: LEFT HEART CATHETERIZATION WITH CORONARY ANGIOGRAM;  Surgeon: [name] Sine, MD;  Location: [facility] CATH LAB;  Service: Cardiovascular;  Laterality: N/A;    FAMILY HISTORY: Family History  Problem Relation Age of Onset  . Hyperlipidemia Father   . Heart disease Father   . Heart disease Sister   . Cancer Brother   . Diabetes Son   . Heart disease Maternal Grandfather   . Colon cancer Neg Hx     SOCIAL HISTORY:  Social History   Social History  . Marital Status: Married    Spouse Name: N/A  . Number of Children: 2  . Years of Education: HS   Occupational History  . Retired    Social History Main Topics  . Smoking status: Former Smoker -- 0.30 packs/day    Types: Cigarettes  . Smokeless tobacco: Never Used     Comment: quit smoking 1985  . Alcohol Use: No  . Drug Use: No  . Sexual Activity: Yes    Birth Control/ Protection: Post-menopausal   Other Topics  Concern  . Not on file   Social History Narrative   Lives at home alone.   Right-handed.   4 cups of caffeine daily.     PHYSICAL EXAM   Filed Vitals:   01/28/15 1609  BP: 134/78  Pulse: 72  Height: 5\' 1"  (1.549 m)  Weight: 169 lb (76.658 kg)    Not recorded      Body mass index is 31.95 kg/(m^2).  PHYSICAL EXAMNIATION:  Gen: NAD, conversant, well nourised, obese, well groomed                     Cardiovascular: Regular rate rhythm, no peripheral edema, warm, nontender. Eyes: Conjunctivae clear without exudates or hemorrhage Neck: Supple, no carotid bruise. Pulmonary: Clear to auscultation bilaterally  Musculoskeletal: Right foot erythematous pitting edema, tenderness at right lateral foot, no significant right ankle tenderness upon deep palpitation, well-healed right bunionectomy scar, no skin breakdown noticed.   NEUROLOGICAL EXAM:  MENTAL STATUS: Speech:    Speech is normal; fluent and spontaneous with normal comprehension.  Cognition:     Orientation to time, place and person     Normal recent and remote memory     Normal Attention span and concentration     Normal Language, naming, repeating,spontaneous speech     Fund of knowledge   CRANIAL NERVES: CN II: Visual fields are full to confrontation. Fundoscopic exam is normal with sharp discs and no vascular changes. Pupils are round equal and briskly reactive to light. CN III, IV, VI: extraocular movement are normal. No ptosis. CN V: Facial sensation is intact to pinprick in all 3 divisions bilaterally. Corneal responses are intact.  CN VII: Face is symmetric with normal eye closure and smile. CN VIII: Hearing is normal to rubbing fingers CN IX, X: Palate elevates symmetrically. Phonation is normal. CN XI: Head turning  and shoulder shrug are intact CN XII: Tongue is midline with normal movements and no atrophy.  MOTOR: There is no pronator drift of out-stretched arms. Muscle bulk and tone are normal. Muscle  strength is normal.  REFLEXES: Reflexes are  3 and  symmetric at the biceps, triceps, knees, and ankles. Plantar responses are extensor bilaterally  SENSORY: Length dependent decreased to light touch, pinprick, vibratory sensation to ankle level  COORDINATION: Rapid alternating movements and fine finger movements are intact. There is no dysmetria on finger-to-nose and heel-knee-shin.    GAIT/STANCE: She needed push up to get up from seated position, cautious, also limited by her left heel pain,  DIAGNOSTIC DATA (LABS, IMAGING, TESTING) - I reviewed patient records, labs, notes, testing and imaging myself where available.   ASSESSMENT AND PLAN  Darlene [name] is a 80 y.o. female with past medical history of right breast cancer, status post lobectomy, followed by chemoradiation therapy in 1994, presented with worsening gait difficulty, neck pain, radiating pain to right lateral 3 fingers. Hyperreflexia on examinations  Cervical degenerative disc disease  Gait difficulty   Multifactorial, due to bilateral foot pain, joints pain, deconditioning   Continue moderate exercise, when necessary NSAIDs, return to clinic for new issues    Marcial Pacas, M.D. Ph.D.  [facility] [address], [place], [place] [address] Ph: [phone] Fax: [phone]  CC: Thressa Sheller, MD

## 2015-01-29 DIAGNOSIS — H2513 Age-related nuclear cataract, bilateral: Secondary | ICD-10-CM | POA: Diagnosis not present

## 2015-01-29 DIAGNOSIS — H52203 Unspecified astigmatism, bilateral: Secondary | ICD-10-CM | POA: Diagnosis not present

## 2015-02-07 ENCOUNTER — Ambulatory Visit (INDEPENDENT_AMBULATORY_CARE_PROVIDER_SITE_OTHER): Payer: Medicare Other | Admitting: Podiatry

## 2015-02-07 ENCOUNTER — Encounter: Payer: Self-pay | Admitting: Podiatry

## 2015-02-07 DIAGNOSIS — B351 Tinea unguium: Secondary | ICD-10-CM

## 2015-02-07 DIAGNOSIS — M79675 Pain in left toe(s): Secondary | ICD-10-CM

## 2015-02-07 DIAGNOSIS — M79674 Pain in right toe(s): Secondary | ICD-10-CM

## 2015-02-07 DIAGNOSIS — M79676 Pain in unspecified toe(s): Secondary | ICD-10-CM | POA: Diagnosis not present

## 2015-02-07 NOTE — Progress Notes (Signed)
Patient ID: Darlene [name], female   DOB: 08/08/1934, 80 y.o.   MRN: [id number] Complaint:  Visit Type: Patient returns to my office for continued preventative foot care services. Complaint: Patient states" my nails have grown long and thick and become painful to walk and wear shoes".  He presents for preventative foot care services. No changes to ROS  Podiatric Exam: Vascular: dorsalis pedis and posterior tibial pulses are palpable bilateral. Capillary return is immediate. Temperature gradient is WNL. Skin turgor WNL  Sensorium: Normal Semmes Weinstein monofilament test. Normal tactile sensation bilaterally. Nail Exam: Pt has thick disfigured discolored nails with subungual debris noted bilateral entire nail hallux through fifth toenails Ulcer Exam: There is no evidence of ulcer or pre-ulcerative changes or infection. Orthopedic Exam: Muscle tone and strength are WNL. No limitations in general ROM. No crepitus or effusions noted. Foot type and digits show no abnormalities. Bony prominences are unremarkable. Skin: No Porokeratosis. No infection or ulcers  Diagnosis:  Tinea unguium, Pain in right toe, pain in left toes  Treatment & Plan Procedures and Treatment: Consent by patient was obtained for treatment procedures. The patient understood the discussion of treatment and procedures well. All questions were answered thoroughly reviewed. Debridement of mycotic and hypertrophic toenails, 1 through 5 bilateral and clearing of subungual debris. No ulceration, no infection noted.  Return Visit-Office Procedure: Patient instructed to return to the office for a follow up visit 3 months for continued evaluation and treatment. 

## 2015-02-14 ENCOUNTER — Ambulatory Visit: Payer: Medicare Other | Admitting: Podiatry

## 2015-03-14 ENCOUNTER — Ambulatory Visit: Payer: Self-pay | Admitting: General Surgery

## 2015-03-14 DIAGNOSIS — K429 Umbilical hernia without obstruction or gangrene: Secondary | ICD-10-CM | POA: Diagnosis not present

## 2015-03-14 NOTE — H&P (Signed)
History of Present Illness Ralene Ok MD; 03/14/2015 11:41 AM) The patient is a 80 year old female who presents with an umbilical hernia. Patient is a 80 year old female who is referred by Larina Earthly for evaluation of an umbilical hernia. Patient states that she feels that she's had this hernia since she was a child. She states that it does become bothersome and she lifts. She has not had any signs or symptoms of incarceration or strangulation.   Other Problems Elbert Ewings, CMA; 03/14/2015 11:05 AM) Anxiety Disorder Arthritis Breast Cancer Cancer Chronic Obstructive Lung Disease Gastroesophageal Reflux Disease High blood pressure Thyroid Disease  Past Surgical History Elbert Ewings, [place]; 03/14/2015 11:05 AM) Breast Mass; Local Excision Right. Foot Surgery Right. Knee Surgery Right. Tonsillectomy  Diagnostic Studies History Elbert Ewings, CMA; 03/14/2015 11:05 AM) Colonoscopy within last year Pap Smear 1-5 years ago  Allergies Elbert Ewings, CMA; 03/14/2015 11:07 AM) OxyCODONE HCl *ANALGESICS - OPIOID* Codeine Sulfate *ANALGESICS - OPIOID* Doxycycline Hyclate *Tetracycline** Meperidine HCl *ANALGESICS - OPIOID* Itching, Rash. Sulfa 10 *OPHTHALMIC AGENTS*  Medication History Elbert Ewings, CMA; 03/14/2015 11:08 AM) Lyrica (50MG  Capsule, Oral) Active. Amitriptyline HCl (25MG  Tablet, Oral) Active. Citalopram Hydrobromide (40MG  Tablet, Oral) Active. Esomeprazole Magnesium (40MG  Capsule DR, Oral) Active. Famciclovir (125MG  Tablet, Oral) Active. L-Methylfolate-B6-B12 (3-35-2MG  Tablet, Oral) Active. Levothyroxine Sodium (75MCG Tablet, Oral) Active. Lovastatin (20MG  Tablet, Oral) Active. Metoprolol Succinate ER (25MG  Tablet ER 24HR, Oral) Active. Mometasone Furoate (0.1% Cream, External) Active. Vitamin D (Ergocalciferol) (50000UNIT Capsule, Oral) Active. Medications Reconciled  Social History Elbert Ewings, [place]; 03/14/2015 11:05  AM) Alcohol use Occasional alcohol use. Caffeine use Coffee. Illicit drug use Recently quit drug use. Tobacco use Former smoker.  Family History Elbert Ewings, [place]; 03/14/2015 11:05 AM) Arthritis Daughter, Father, Mother. Heart Disease Father. Hypertension Daughter, Son.  Pregnancy / Birth History Elbert Ewings, CMA; 03/14/2015 11:05 AM) Age at menarche 81 years. Age of menopause 8-60 Gravida 2 Maternal age 32-25 Para 2 Regular periods    Review of Systems Elbert Ewings CMA; 03/14/2015 11:05 AM) General Present- Fatigue. Not Present- Appetite Loss, Chills, Fever, Night Sweats, Weight Gain and Weight Loss. Skin Not Present- Change in Wart/Mole, Dryness, Hives, Jaundice, New Lesions, Non-Healing Wounds, Rash and Ulcer. HEENT Present- Hearing Loss and Wears glasses/contact lenses. Not Present- Earache, Hoarseness, Nose Bleed, Oral Ulcers, Ringing in the Ears, Seasonal Allergies, Sinus Pain, Sore Throat, Visual Disturbances and Yellow Eyes. Respiratory Not Present- Bloody sputum, Chronic Cough, Difficulty Breathing, Snoring and Wheezing. Breast Not Present- Breast Mass, Breast Pain, Nipple Discharge and Skin Changes. Cardiovascular Not Present- Chest Pain, Difficulty Breathing Lying Down, Leg Cramps, Palpitations, Rapid Heart Rate, Shortness of Breath and Swelling of Extremities. Gastrointestinal Not Present- Abdominal Pain, Bloating, Bloody Stool, Change in Bowel Habits, Chronic diarrhea, Constipation, Difficulty Swallowing, Excessive gas, Gets full quickly at meals, Hemorrhoids, Indigestion, Nausea, Rectal Pain and Vomiting. Female Genitourinary Present- Frequency and Nocturia. Not Present- Painful Urination, Pelvic Pain and Urgency. Musculoskeletal Not Present- Back Pain, Joint Pain, Joint Stiffness, Muscle Pain, Muscle Weakness and Swelling of Extremities. Neurological Not Present- Decreased Memory, Fainting, Headaches, Numbness, Seizures, Tingling, Tremor, Trouble walking  and Weakness. Psychiatric Not Present- Anxiety, Bipolar, Change in Sleep Pattern, Depression, Fearful and Frequent crying. Endocrine Not Present- Cold Intolerance, Excessive Hunger, Hair Changes, Heat Intolerance, Hot flashes and New Diabetes. Hematology Not Present- Easy Bruising, Excessive bleeding, Gland problems, HIV and Persistent Infections.  Vitals Elbert Ewings CMA; 03/14/2015 11:08 AM) 03/14/2015 11:08 AM Weight: 156.2 lb Height: 61in Body Surface Area: 1.7 m Body Mass Index:  29.51 kg/m  Temp.: 97.72F(Temporal)  Pulse: 80 (Regular)  BP: 126/76 (Sitting, Left Arm, Standard)       Physical Exam Ralene Ok MD; 03/14/2015 11:41 AM) General Mental Status-Alert. General Appearance-Consistent with stated age. Hydration-Well hydrated. Voice-Normal.  Head and Neck Head-normocephalic, atraumatic with no lesions or palpable masses. Trachea-midline. Thyroid Gland Characteristics - normal size and consistency.  Chest and Lung Exam Chest and lung exam reveals -quiet, even and easy respiratory effort with no use of accessory muscles and on auscultation, normal breath sounds, no adventitious sounds and normal vocal resonance. Inspection Chest Wall - Normal. Back - normal.  Cardiovascular Cardiovascular examination reveals -normal heart sounds, regular rate and rhythm with no murmurs and normal pedal pulses bilaterally.  Abdomen Inspection Skin - Scar - no surgical scars. Hernias - Umbilical hernia - Reducible(Approximately 2 cm umbilical hernia). Palpation/Percussion Normal exam - Soft, Non Tender, No Rebound tenderness, No Rigidity (guarding) and No hepatosplenomegaly. Auscultation Normal exam - Bowel sounds normal.    Assessment & Plan Ralene Ok MD; [id number] AM) UMBILICAL HERNIA WITHOUT OBSTRUCTION AND WITHOUT GANGRENE (K42.9) Impression: 80 year old female with a reducible to cement umbilical hernia.  1. The patient  will like to proceed to the operating room for laparoscopic umbilical hernia repair.  2. I discussed with the patient the signs and symptoms of incarceration and strangulation and the need to proceed to the ER should they occur.  3. I discussed with the patient the risks and benefits of the procedure to include but not limited to: Infection, bleeding, damage to surrounding structures, possible need for further surgery, possible nerve pain, and possible recurrence. The patient was understanding and wishes to proceed.

## 2015-03-19 DIAGNOSIS — G629 Polyneuropathy, unspecified: Secondary | ICD-10-CM | POA: Diagnosis not present

## 2015-04-16 ENCOUNTER — Encounter (HOSPITAL_COMMUNITY): Payer: Self-pay | Admitting: *Deleted

## 2015-04-17 ENCOUNTER — Encounter (HOSPITAL_COMMUNITY): Payer: Self-pay | Admitting: *Deleted

## 2015-04-17 ENCOUNTER — Ambulatory Visit (HOSPITAL_COMMUNITY): Payer: Medicare Other | Admitting: Anesthesiology

## 2015-04-17 ENCOUNTER — Ambulatory Visit (HOSPITAL_COMMUNITY)
Admission: RE | Admit: 2015-04-17 | Discharge: 2015-04-17 | Disposition: A | Discharge status: Home / Self Care | Payer: Medicare Other | Source: Ambulatory Visit | Attending: General Surgery | Admitting: General Surgery

## 2015-04-17 ENCOUNTER — Encounter (HOSPITAL_COMMUNITY)
Admission: RE | Disposition: A | Discharge status: Home / Self Care | Payer: Self-pay | Source: Ambulatory Visit | Attending: General Surgery

## 2015-04-17 DIAGNOSIS — K429 Umbilical hernia without obstruction or gangrene: Secondary | ICD-10-CM | POA: Insufficient documentation

## 2015-04-17 DIAGNOSIS — J449 Chronic obstructive pulmonary disease, unspecified: Secondary | ICD-10-CM | POA: Diagnosis not present

## 2015-04-17 DIAGNOSIS — Z853 Personal history of malignant neoplasm of breast: Secondary | ICD-10-CM | POA: Insufficient documentation

## 2015-04-17 DIAGNOSIS — M1990 Unspecified osteoarthritis, unspecified site: Secondary | ICD-10-CM | POA: Insufficient documentation

## 2015-04-17 DIAGNOSIS — K219 Gastro-esophageal reflux disease without esophagitis: Secondary | ICD-10-CM | POA: Diagnosis not present

## 2015-04-17 HISTORY — DX: Unspecified cataract: H26.9

## 2015-04-17 HISTORY — DX: Polyneuropathy, unspecified: G62.9

## 2015-04-17 HISTORY — DX: Major depressive disorder, single episode, unspecified: F32.9

## 2015-04-17 HISTORY — DX: Reserved for inherently not codable concepts without codable children: IMO0001

## 2015-04-17 HISTORY — DX: Depression, unspecified: F32.A

## 2015-04-17 HISTORY — PX: INSERTION OF MESH: SHX5868

## 2015-04-17 HISTORY — PX: UMBILICAL HERNIA REPAIR: SHX196

## 2015-04-17 HISTORY — DX: Unspecified hearing loss, unspecified ear: H91.90

## 2015-04-17 HISTORY — DX: Anxiety disorder, unspecified: F41.9

## 2015-04-17 LAB — BASIC METABOLIC PANEL
ANION GAP: 8 (ref 5–15)
BUN: 16 mg/dL (ref 6–20)
CO2: 25 mmol/L (ref 22–32)
Calcium: 10.4 mg/dL — ABNORMAL HIGH (ref 8.9–10.3)
Chloride: 106 mmol/L (ref 101–111)
Creatinine, Ser: 0.62 mg/dL (ref 0.44–1.00)
GFR calc Af Amer: >60 mL/min (ref >60)
Glucose, Bld: 97 mg/dL (ref 65–99)
POTASSIUM: 3.8 mmol/L (ref 3.5–5.1)
SODIUM: 139 mmol/L (ref 135–145)

## 2015-04-17 LAB — CBC
HEMATOCRIT: 36.8 % (ref 36.0–46.0)
Hemoglobin: 11.9 g/dL — ABNORMAL LOW (ref 12.0–15.0)
MCH: 28.9 pg (ref 26.0–34.0)
MCHC: 32.3 g/dL (ref 30.0–36.0)
MCV: 89.3 fL (ref 78.0–100.0)
Platelets: 97 10*3/uL — ABNORMAL LOW (ref 150–400)
RBC: 4.12 MIL/uL (ref 3.87–5.11)
RDW: 13.8 % (ref 11.5–15.5)
WBC: 4.4 10*3/uL (ref 4.0–10.5)

## 2015-04-17 SURGERY — REPAIR, HERNIA, UMBILICAL, LAPAROSCOPIC
Anesthesia: General

## 2015-04-17 MED ORDER — NEOSTIGMINE METHYLSULFATE 10 MG/10ML IV SOLN
INTRAVENOUS | Status: DC | PRN
  Administered 2015-04-17: 3 mg via INTRAVENOUS

## 2015-04-17 MED ORDER — NEOSTIGMINE METHYLSULFATE 10 MG/10ML IV SOLN
INTRAVENOUS | Status: AC
  Filled 2015-04-17: qty 1

## 2015-04-17 MED ORDER — MIDAZOLAM HCL 2 MG/2ML IJ SOLN
INTRAMUSCULAR | Status: AC
  Filled 2015-04-17: qty 2

## 2015-04-17 MED ORDER — HYDROCODONE-ACETAMINOPHEN 5-325 MG PO TABS
1.0000 | ORAL_TABLET | Freq: Four times a day (QID) | ORAL | Status: DC | PRN
Start: 2015-04-17 — End: 2015-05-08

## 2015-04-17 MED ORDER — GLYCOPYRROLATE 0.2 MG/ML IJ SOLN
INTRAMUSCULAR | Status: AC
  Filled 2015-04-17: qty 2

## 2015-04-17 MED ORDER — CHLORHEXIDINE GLUCONATE 4 % EX LIQD: 1.0000 "application " | Freq: Once | CUTANEOUS | Status: DC

## 2015-04-17 MED ORDER — LIDOCAINE HCL (CARDIAC) 20 MG/ML IV SOLN
INTRAVENOUS | Status: DC | PRN
  Administered 2015-04-17: 50 mg via INTRAVENOUS

## 2015-04-17 MED ORDER — MIDAZOLAM HCL 5 MG/5ML IJ SOLN
INTRAMUSCULAR | Status: DC | PRN
  Administered 2015-04-17: 1 mg via INTRAVENOUS

## 2015-04-17 MED ORDER — PROPOFOL 10 MG/ML IV BOLUS
INTRAVENOUS | Status: DC | PRN
  Administered 2015-04-17: 140 mg via INTRAVENOUS

## 2015-04-17 MED ORDER — PROMETHAZINE HCL 25 MG/ML IJ SOLN: 6.2500 mg | INTRAMUSCULAR | Status: DC | PRN

## 2015-04-17 MED ORDER — BUPIVACAINE HCL (PF) 0.25 % IJ SOLN
INTRAMUSCULAR | Status: AC
  Filled 2015-04-17: qty 10

## 2015-04-17 MED ORDER — ROCURONIUM BROMIDE 100 MG/10ML IV SOLN
INTRAVENOUS | Status: DC | PRN
  Administered 2015-04-17: 30 mg via INTRAVENOUS

## 2015-04-17 MED ORDER — HYDROMORPHONE HCL 1 MG/ML IJ SOLN
0.2500 mg | INTRAMUSCULAR | Status: DC | PRN
  Administered 2015-04-17 (×2): 0.25 mg via INTRAVENOUS
  Administered 2015-04-17: 0.5 mg via INTRAVENOUS

## 2015-04-17 MED ORDER — PROPOFOL 10 MG/ML IV BOLUS
INTRAVENOUS | Status: AC
  Filled 2015-04-17: qty 20

## 2015-04-17 MED ORDER — HYDROCODONE-ACETAMINOPHEN 5-325 MG PO TABS
ORAL_TABLET | ORAL | Status: AC
  Administered 2015-04-17: 1
  Filled 2015-04-17: qty 1

## 2015-04-17 MED ORDER — BUPIVACAINE HCL 0.25 % IJ SOLN
INTRAMUSCULAR | Status: DC | PRN
  Administered 2015-04-17: 10 mL

## 2015-04-17 MED ORDER — BUPIVACAINE HCL (PF) 0.25 % IJ SOLN
INTRAMUSCULAR | Status: AC
  Filled 2015-04-17: qty 30

## 2015-04-17 MED ORDER — ONDANSETRON HCL 4 MG/2ML IJ SOLN
INTRAMUSCULAR | Status: DC | PRN
  Administered 2015-04-17: 4 mg via INTRAVENOUS

## 2015-04-17 MED ORDER — GLYCOPYRROLATE 0.2 MG/ML IJ SOLN
INTRAMUSCULAR | Status: DC | PRN
  Administered 2015-04-17: 0.4 mg via INTRAVENOUS

## 2015-04-17 MED ORDER — ROCURONIUM BROMIDE 50 MG/5ML IV SOLN
INTRAVENOUS | Status: AC
  Filled 2015-04-17: qty 1

## 2015-04-17 MED ORDER — ONDANSETRON HCL 4 MG/2ML IJ SOLN
INTRAMUSCULAR | Status: AC
  Filled 2015-04-17: qty 2

## 2015-04-17 MED ORDER — SODIUM CHLORIDE 0.9 % IR SOLN
Status: DC | PRN
  Administered 2015-04-17: 1000 mL

## 2015-04-17 MED ORDER — HYDROCODONE-ACETAMINOPHEN 5-325 MG PO TABS
ORAL_TABLET | ORAL | Status: AC
  Filled 2015-04-17: qty 1

## 2015-04-17 MED ORDER — CEFAZOLIN SODIUM-DEXTROSE 2-3 GM-% IV SOLR
2.0000 g | INTRAVENOUS | Status: AC
  Administered 2015-04-17: 2 g via INTRAVENOUS
  Filled 2015-04-17: qty 50

## 2015-04-17 MED ORDER — FENTANYL CITRATE (PF) 250 MCG/5ML IJ SOLN
INTRAMUSCULAR | Status: AC
  Filled 2015-04-17: qty 5

## 2015-04-17 MED ORDER — FENTANYL CITRATE (PF) 100 MCG/2ML IJ SOLN
INTRAMUSCULAR | Status: DC | PRN
  Administered 2015-04-17: 100 ug via INTRAVENOUS

## 2015-04-17 MED ORDER — HYDROMORPHONE HCL 1 MG/ML IJ SOLN
INTRAMUSCULAR | Status: AC
  Administered 2015-04-17: 0.5 mg via INTRAVENOUS
  Filled 2015-04-17: qty 1

## 2015-04-17 MED ORDER — HYDROCODONE-ACETAMINOPHEN 5-325 MG PO TABS
1.0000 | ORAL_TABLET | Freq: Once | ORAL | Status: AC
Start: 2015-04-17 — End: 2015-04-17
  Administered 2015-04-17: 1 via ORAL

## 2015-04-17 MED ORDER — LACTATED RINGERS IV SOLN
INTRAVENOUS | Status: DC
  Administered 2015-04-17 (×2): via INTRAVENOUS

## 2015-04-17 SURGICAL SUPPLY — 46 items
APL SKNCLS STERI-STRIP NONHPOA (GAUZE/BANDAGES/DRESSINGS) ×1
APPLIER CLIP LOGIC TI 5 (MISCELLANEOUS) ×2 IMPLANT
APPLIER CLIP ROT 10 11.4 M/L (STAPLE)
APR CLP MED LRG 11.4X10 (STAPLE)
APR CLP MED LRG 33X5 (MISCELLANEOUS) ×1
BENZOIN TINCTURE PRP APPL 2/3 (GAUZE/BANDAGES/DRESSINGS) ×2 IMPLANT
CANISTER SUCTION 2500CC (MISCELLANEOUS) ×2 IMPLANT
CHLORAPREP W/TINT 26ML (MISCELLANEOUS) ×2 IMPLANT
CLIP APPLIE ROT 10 11.4 M/L (STAPLE) IMPLANT
COVER SURGICAL LIGHT HANDLE (MISCELLANEOUS) ×2 IMPLANT
DEVICE SECURE STRAP 25 ABSORB (INSTRUMENTS) ×2 IMPLANT
DEVICE TROCAR PUNCTURE CLOSURE (ENDOMECHANICALS) ×2 IMPLANT
ELECT REM PT RETURN 9FT ADLT (ELECTROSURGICAL) ×2
ELECTRODE REM PT RTRN 9FT ADLT (ELECTROSURGICAL) ×1 IMPLANT
GAUZE SPONGE 2X2 8PLY STRL LF (GAUZE/BANDAGES/DRESSINGS) ×1 IMPLANT
GLOVE BIO SURGEON STRL SZ7.5 (GLOVE) ×2 IMPLANT
GLOVE BIOGEL PI IND STRL 8 (GLOVE) ×1 IMPLANT
GLOVE BIOGEL PI INDICATOR 8 (GLOVE) ×1
GLOVE SURG SS PI 7.5 STRL IVOR (GLOVE) ×2 IMPLANT
GOWN STRL REUS W/ TWL LRG LVL3 (GOWN DISPOSABLE) ×2 IMPLANT
GOWN STRL REUS W/ TWL XL LVL3 (GOWN DISPOSABLE) ×1 IMPLANT
GOWN STRL REUS W/TWL LRG LVL3 (GOWN DISPOSABLE) ×4
GOWN STRL REUS W/TWL XL LVL3 (GOWN DISPOSABLE) ×2
KIT BASIN OR (CUSTOM PROCEDURE TRAY) ×2 IMPLANT
KIT ROOM TURNOVER OR (KITS) ×2 IMPLANT
MARKER SKIN DUAL TIP RULER LAB (MISCELLANEOUS) ×2 IMPLANT
MESH VENTRALIGHT ST 4.5IN (Mesh General) ×2 IMPLANT
NEEDLE INSUFFLATION 14GA 120MM (NEEDLE) ×2 IMPLANT
NEEDLE SPNL 22GX3.5 QUINCKE BK (NEEDLE) ×2 IMPLANT
NS IRRIG 1000ML POUR BTL (IV SOLUTION) ×2 IMPLANT
PAD ARMBOARD 7.5X6 YLW CONV (MISCELLANEOUS) ×3 IMPLANT
SCISSORS LAP 5X35 DISP (ENDOMECHANICALS) ×2 IMPLANT
SET IRRIG TUBING LAPAROSCOPIC (IRRIGATION / IRRIGATOR) IMPLANT
SLEEVE ENDOPATH XCEL 5M (ENDOMECHANICALS) ×2 IMPLANT
SPONGE GAUZE 2X2 STER 10/PKG (GAUZE/BANDAGES/DRESSINGS) ×1
STRIP CLOSURE SKIN 1/2X4 (GAUZE/BANDAGES/DRESSINGS) ×2 IMPLANT
SUT CHROMIC 2 0 SH (SUTURE) ×2 IMPLANT
SUT MNCRL AB 4-0 PS2 18 (SUTURE) ×2 IMPLANT
SUT PROLENE 2 0 KS (SUTURE) IMPLANT
TAPE CLOTH SOFT 2X10 (GAUZE/BANDAGES/DRESSINGS) ×2 IMPLANT
TOWEL OR 17X24 6PK STRL BLUE (TOWEL DISPOSABLE) ×4 IMPLANT
TRAY LAPAROSCOPIC MC (CUSTOM PROCEDURE TRAY) ×2 IMPLANT
TROCAR XCEL BLUNT TIP 100MML (ENDOMECHANICALS) IMPLANT
TROCAR XCEL NON-BLD 11X100MML (ENDOMECHANICALS) IMPLANT
TROCAR XCEL NON-BLD 5MMX100MML (ENDOMECHANICALS) ×2 IMPLANT
TUBING INSUFFLATION (TUBING) ×2 IMPLANT

## 2015-04-17 NOTE — H&P (Signed)
History of Present Illness Ralene Ok MD; 03/14/2015 11:41 AM) The patient is a 80 year old female who presents with an umbilical hernia. Patient is a 80 year old female who is referred by Larina Earthly for evaluation of an umbilical hernia. Patient states that she feels that she's had this hernia since she was a child. She states that it does become bothersome and she lifts. She has not had any signs or symptoms of incarceration or strangulation.   Other Problems Elbert Ewings, CMA; 03/14/2015 11:05 AM) Anxiety Disorder Arthritis Breast Cancer Cancer Chronic Obstructive Lung Disease Gastroesophageal Reflux Disease High blood pressure Thyroid Disease  Past Surgical History Elbert Ewings, [place]; 03/14/2015 11:05 AM) Breast Mass; Local Excision Right. Foot Surgery Right. Knee Surgery Right. Tonsillectomy  Diagnostic Studies History Elbert Ewings, CMA; 03/14/2015 11:05 AM) Colonoscopy within last year Pap Smear 1-5 years ago  Allergies Elbert Ewings, CMA; 03/14/2015 11:07 AM) OxyCODONE HCl *ANALGESICS - OPIOID* Codeine Sulfate *ANALGESICS - OPIOID* Doxycycline Hyclate *Tetracycline** Meperidine HCl *ANALGESICS - OPIOID* Itching, Rash. Sulfa 10 *OPHTHALMIC AGENTS*  Medication History Elbert Ewings, CMA; 03/14/2015 11:08 AM) Lyrica (50MG  Capsule, Oral) Active. Amitriptyline HCl (25MG  Tablet, Oral) Active. Citalopram Hydrobromide (40MG  Tablet, Oral) Active. Esomeprazole Magnesium (40MG  Capsule DR, Oral) Active. Famciclovir (125MG  Tablet, Oral) Active. L-Methylfolate-B6-B12 (3-35-2MG  Tablet, Oral) Active. Levothyroxine Sodium (75MCG Tablet, Oral) Active. Lovastatin (20MG  Tablet, Oral) Active. Metoprolol Succinate ER (25MG  Tablet ER 24HR, Oral) Active. Mometasone Furoate (0.1% Cream, External) Active. Vitamin D (Ergocalciferol) (50000UNIT Capsule, Oral) Active. Medications Reconciled  Social History Elbert Ewings, [place]; 03/14/2015 11:05  AM) Alcohol use Occasional alcohol use. Caffeine use Coffee. Illicit drug use Recently quit drug use. Tobacco use Former smoker.  Family History Elbert Ewings, [place]; 03/14/2015 11:05 AM) Arthritis Daughter, Father, Mother. Heart Disease Father. Hypertension Daughter, Son.  Pregnancy / Birth History Elbert Ewings, CMA; 03/14/2015 11:05 AM) Age at menarche 20 years. Age of menopause 67-60 Gravida 2 Maternal age 65-25 Para 2 Regular periods    Review of Systems Elbert Ewings CMA; 03/14/2015 11:05 AM) General Present- Fatigue. Not Present- Appetite Loss, Chills, Fever, Night Sweats, Weight Gain and Weight Loss. Skin Not Present- Change in Wart/Mole, Dryness, Hives, Jaundice, New Lesions, Non-Healing Wounds, Rash and Ulcer. HEENT Present- Hearing Loss and Wears glasses/contact lenses. Not Present- Earache, Hoarseness, Nose Bleed, Oral Ulcers, Ringing in the Ears, Seasonal Allergies, Sinus Pain, Sore Throat, Visual Disturbances and Yellow Eyes. Respiratory Not Present- Bloody sputum, Chronic Cough, Difficulty Breathing, Snoring and Wheezing. Breast Not Present- Breast Mass, Breast Pain, Nipple Discharge and Skin Changes. Cardiovascular Not Present- Chest Pain, Difficulty Breathing Lying Down, Leg Cramps, Palpitations, Rapid Heart Rate, Shortness of Breath and Swelling of Extremities. Gastrointestinal Not Present- Abdominal Pain, Bloating, Bloody Stool, Change in Bowel Habits, Chronic diarrhea, Constipation, Difficulty Swallowing, Excessive gas, Gets full quickly at meals, Hemorrhoids, Indigestion, Nausea, Rectal Pain and Vomiting. Female Genitourinary Present- Frequency and Nocturia. Not Present- Painful Urination, Pelvic Pain and Urgency. Musculoskeletal Not Present- Back Pain, Joint Pain, Joint Stiffness, Muscle Pain, Muscle Weakness and Swelling of Extremities. Neurological Not Present- Decreased Memory, Fainting, Headaches, Numbness, Seizures, Tingling, Tremor, Trouble walking  and Weakness. Psychiatric Not Present- Anxiety, Bipolar, Change in Sleep Pattern, Depression, Fearful and Frequent crying. Endocrine Not Present- Cold Intolerance, Excessive Hunger, Hair Changes, Heat Intolerance, Hot flashes and New Diabetes. Hematology Not Present- Easy Bruising, Excessive bleeding, Gland problems, HIV and Persistent Infections.  Vitals Elbert Ewings CMA; 03/14/2015 11:08 AM) 03/14/2015 11:08 AM Weight: 156.2 lb Height: 61in Body Surface Area: 1.7 m Body Mass Index:  29.51 kg/m  Temp.: 97.66F(Temporal)  Pulse: 80 (Regular)  BP: 126/76 (Sitting, Left Arm, Standard)       Physical Exam Ralene Ok MD; 03/14/2015 11:41 AM) General Mental Status-Alert. General Appearance-Consistent with stated age. Hydration-Well hydrated. Voice-Normal.  Head and Neck Head-normocephalic, atraumatic with no lesions or palpable masses. Trachea-midline. Thyroid Gland Characteristics - normal size and consistency.  Chest and Lung Exam Chest and lung exam reveals -quiet, even and easy respiratory effort with no use of accessory muscles and on auscultation, normal breath sounds, no adventitious sounds and normal vocal resonance. Inspection Chest Wall - Normal. Back - normal.  Cardiovascular Cardiovascular examination reveals -normal heart sounds, regular rate and rhythm with no murmurs and normal pedal pulses bilaterally.  Abdomen Inspection Skin - Scar - no surgical scars. Hernias - Umbilical hernia - Reducible(Approximately 2 cm umbilical hernia). Palpation/Percussion Normal exam - Soft, Non Tender, No Rebound tenderness, No Rigidity (guarding) and No hepatosplenomegaly. Auscultation Normal exam - Bowel sounds normal.    Assessment & Plan Ralene Ok MD; [id number] AM) UMBILICAL HERNIA WITHOUT OBSTRUCTION AND WITHOUT GANGRENE (K42.9) Impression: 80 year old female with a reducible to cement umbilical hernia.  1. The patient  will like to proceed to the operating room for laparoscopic umbilical hernia repair.  2. I discussed with the patient the risks and benefits of the procedure to include but not limited to: Infection, bleeding, damage to surrounding structures, possible need for further surgery, possible nerve pain, and possible recurrence. The patient was understanding and wishes to proceed.

## 2015-04-17 NOTE — Discharge Instructions (Signed)
[facility] _______[facility] ° °UMBILICAL HERNIA REPAIR: POST OP INSTRUCTIONS ° °Always review your discharge instruction sheet given to you by the facility where your surgery was performed. °IF YOU HAVE DISABILITY OR FAMILY LEAVE FORMS, YOU MUST BRING THEM TO THE OFFICE FOR PROCESSING.   °DO NOT GIVE THEM TO YOUR DOCTOR. ° °1. A  prescription for pain medication may be given to you upon discharge.  Take your pain medication as prescribed, if needed.  If narcotic pain medicine is not needed, then you may take acetaminophen (Tylenol) or ibuprofen (Advil) as needed. °2. Take your usually prescribed medications unless otherwise directed. °3. If you need a refill on your pain medication, please contact your pharmacy.  They will contact our office to request authorization. Prescriptions will not be filled after 5 pm or on week-ends. °4. You should follow a light diet the first 24 hours after arrival home, such as soup and crackers, etc.  Be sure to include lots of fluids daily.  Resume your normal diet the day after surgery. °5. Most patients will experience some swelling and bruising around the umbilicus or in the groin and scrotum.  Ice packs and reclining will help.  Swelling and bruising can take several days to resolve.  °6. It is common to experience some constipation if taking pain medication after surgery.  Increasing fluid intake and taking a stool softener (such as Colace) will usually help or prevent this problem from occurring.  A mild laxative (Milk of Magnesia or Miralax) should be taken according to package directions if there are no bowel movements after 48 hours. °7. Unless discharge instructions indicate otherwise, you may remove your bandages 24-48 hours after surgery, and you may shower at that time.  You may have steri-strips (small skin tapes) in place directly over the incision.  These strips should be left on the skin for 7-10 days.  If your surgeon used skin glue on the incision, you  may shower in 24 hours.  The glue will flake off over the next 2-3 weeks.  Any sutures or staples will be removed at the office during your follow-up visit. °8. ACTIVITIES:  You may resume regular (light) daily activities beginning the next day--such as daily self-care, walking, climbing stairs--gradually increasing activities as tolerated.  You may have sexual intercourse when it is comfortable.  Refrain from any heavy lifting or straining until approved by your doctor. °a. You may drive when you are no longer taking prescription pain medication, you can comfortably wear a seatbelt, and you can safely maneuver your car and apply brakes. °b. RETURN TO WORK:  __________________________________________________________ °9. You should see your doctor in the office for a follow-up appointment approximately 2-3 weeks after your surgery.  Make sure that you call for this appointment within a day or two after you arrive home to insure a convenient appointment time. °10. OTHER INSTRUCTIONS:  __________________________________________________________________________________________________________________________________________________________________________________________  °WHEN TO CALL YOUR DOCTOR: °1. Fever over 101.0 °2. Inability to urinate °3. Nausea and/or vomiting °4. Extreme swelling or bruising °5. Continued bleeding from incision. °6. Increased pain, redness, or drainage from the incision ° °The clinic staff is available to answer your questions during regular business hours.  Please don’t hesitate to call and ask to speak to one of the nurses for clinical concerns.  If you have a medical emergency, go to the nearest emergency room or call 911.  A surgeon from [facility] is always on call at the hospital ° ° °[address], [address], [place], [place]  [address] ? ° [address] [address], [place], [place]   [address] °[phone] ? [phone] ? FAX [phone] °Web site:  [email or web address] ° °

## 2015-04-17 NOTE — Op Note (Signed)
04/17/2015  10:33 AM  PATIENT:  Darlene [name]  80 y.o. female  PRE-OPERATIVE DIAGNOSIS:  UMBILICAL HERNIA  POST-OPERATIVE DIAGNOSIS:  [id number] Umblical hernia  PROCEDURE:  Procedure(s): LAPAROSCOPIC UMBILICAL HERNIA REPAIR WITH MESH (N/A) INSERTION OF MESH (N/A)  SURGEON:  Surgeon(s) and Role:    * Ralene Ok, MD - Primary  PHYSICIAN ASSISTANT:   ASSISTANTS: none   ANESTHESIA:   local and general  EBL:   <5cc  BLOOD ADMINISTERED:none  DRAINS: none   LOCAL MEDICATIONS USED:  BUPIVICAINE   SPECIMEN:  none  DISPOSITION OF SPECIMEN:  N/A  COUNTS:  YES  TOURNIQUET:  * No tourniquets in log *  DICTATION: .Dragon Dictation  Details of the procedure:   After the patient was consented patient was taken back to the operating room patient was then placed in supine position bilateral SCDs in place.  The patient was prepped and draped in the usual sterile fashion. After antibiotics were confirmed a timeout was called and all facts were verified. The Veress needle technique was used to insuflate the abdomen at Palmer's point. The abdomen was insufflated to 14 mm mercury. Subsequently a 5 mm trocar was placed a camera inserted there was no injury to any intra-abdominal organs.    There was seen to be an unincarcerated  99991111 umbilical hernia.  A second camera port was in placed into the left lower quadrant.   The falciform ligament was taken down from the abdominal wall with cautery.  I proceeded to excise the hernia sac.  Once the hernia was cleared away, a Bard Ventralight 11.4cm  mesh was inserted into the abdomen.  The mesh was secured circumferentially with am Securestrap tacker in a double crown fashion.    The omentum was brought over the area of the mesh. The pneumoperitoneum was evacuated  & all trocars  were removed. The skin was reapproximated with 4-0  Monocryl sutures in a subcuticular fashion. The skin was dressed with Steri-Strips tape and gauze.  The patient  was taken to the recovery room in stable condition.   PLAN OF CARE: Discharge to home after PACU  PATIENT DISPOSITION:  PACU - hemodynamically stable.   Delay start of Pharmacological VTE agent (>24hrs) due to surgical blood loss or risk of bleeding: not applicable

## 2015-04-17 NOTE — Progress Notes (Signed)
Patient unable to void after multiple attempts, IV fluids and PO given. Bladder scan on two occasions 160 ml and 256ml. Updated Dr Rosendo Gros who wants patient to have in/out cath with instructions to call office in AM or go to ER if unable to void after 8 hours. Patient catheterized with 400 ml of clear, yellow urine. Patient tolerated well and verbalized understanding of further instructions regarding voiding.

## 2015-04-17 NOTE — Anesthesia Preprocedure Evaluation (Signed)
Anesthesia Evaluation  Patient identified by MRN, date of birth, ID band Patient awake    Reviewed: Allergy & Precautions, H&P , NPO status , Patient's Chart, lab work & pertinent test results  Airway Mallampati: II  TM Distance: >3 FB Neck ROM: Full    Dental  (+) Teeth Intact, Dental Advisory Given   Pulmonary COPD,  COPD inhaler, former smoker,    Pulmonary exam normal        Cardiovascular hypertension, Pt. on home beta blockers + angina Normal cardiovascular exam     Neuro/Psych negative neurological ROS  negative psych ROS   GI/Hepatic Neg liver ROS, GERD  Medicated,  Endo/Other  Hypothyroidism   Renal/GU negative Renal ROS     Musculoskeletal   Abdominal   Peds  Hematology   Anesthesia Other Findings   Reproductive/Obstetrics                             Anesthesia Physical  Anesthesia Plan  ASA: III  Anesthesia Plan: General   Post-op Pain Management:    Induction: Intravenous  Airway Management Planned: Oral ETT  Additional Equipment:   Intra-op Plan:   Post-operative Plan: Extubation in OR  Informed Consent: I have reviewed the patients History and Physical, chart, labs and discussed the procedure including the risks, benefits and alternatives for the proposed anesthesia with the patient or authorized representative who has indicated his/her understanding and acceptance.   Dental advisory given  Plan Discussed with: CRNA, Anesthesiologist and Surgeon  Anesthesia Plan Comments:         Anesthesia Quick Evaluation

## 2015-04-17 NOTE — Anesthesia Postprocedure Evaluation (Signed)
Anesthesia Post Note  Patient: Darlene [name]  Procedure(s) Performed: Procedure(s) (LRB): LAPAROSCOPIC UMBILICAL HERNIA REPAIR WITH MESH (N/A) INSERTION OF MESH (N/A)  Patient location during evaluation: PACU Anesthesia Type: General Level of consciousness: awake and alert Pain management: pain level controlled Vital Signs Assessment: post-procedure vital signs reviewed and stable Respiratory status: spontaneous breathing, nonlabored ventilation, respiratory function stable and patient connected to nasal cannula oxygen Cardiovascular status: blood pressure returned to baseline and stable Postop Assessment: no signs of nausea or vomiting Anesthetic complications: no    Last Vitals:  Filed Vitals:   04/17/15 1104 04/17/15 1115  BP:  126/56  Pulse: 57 58  Temp:    Resp: 14 12    Last Pain:  Filed Vitals:   04/17/15 1142  PainSc: 2                  Dietrich Ke,W. EDMOND

## 2015-04-17 NOTE — Anesthesia Procedure Notes (Signed)
Procedure Name: Intubation Date/Time: 04/17/2015 9:59 AM Performed by: Melina Copa, Lesette Frary R Pre-anesthesia Checklist: Patient identified, Emergency Drugs available, Suction available, Patient being monitored and Timeout performed Patient Re-evaluated:Patient Re-evaluated prior to inductionOxygen Delivery Method: Circle system utilized Preoxygenation: Pre-oxygenation with 100% oxygen Intubation Type: IV induction and Cricoid Pressure applied Ventilation: Mask ventilation without difficulty Laryngoscope Size: Mac and 3 Grade View: Grade I Tube type: Oral Tube size: 7.5 mm Number of attempts: 1 Airway Equipment and Method: Stylet Placement Confirmation: ETT inserted through vocal cords under direct vision and positive ETCO2 Secured at: 21 cm Tube secured with: Tape Dental Injury: Teeth and Oropharynx as per pre-operative assessment

## 2015-04-17 NOTE — Transfer of Care (Signed)
Immediate Anesthesia Transfer of Care Note  Patient: Darlene [name]  Procedure(s) Performed: Procedure(s): LAPAROSCOPIC UMBILICAL HERNIA REPAIR WITH MESH (N/A) INSERTION OF MESH (N/A)  Patient Location: PACU  Anesthesia Type:General  Level of Consciousness: awake, oriented and patient cooperative  Airway & Oxygen Therapy: Patient Spontanous Breathing and Patient connected to nasal cannula oxygen  Post-op Assessment: Report given to RN, Post -op Vital signs reviewed and stable and Patient moving all extremities  Post vital signs: Reviewed and stable  Last Vitals:  Filed Vitals:   04/17/15 0743  BP: 119/63  Pulse: 62  Temp: 36.6 C  Resp: 18    Complications: No apparent anesthesia complications

## 2015-04-18 ENCOUNTER — Encounter (HOSPITAL_COMMUNITY): Payer: Self-pay | Admitting: General Surgery

## 2015-04-25 ENCOUNTER — Ambulatory Visit (INDEPENDENT_AMBULATORY_CARE_PROVIDER_SITE_OTHER): Payer: Medicare Other | Admitting: Physician Assistant

## 2015-04-25 VITALS — BP 110/58 | HR 68 | Temp 97.8°F | Resp 20 | Ht 61.0 in | Wt 146.8 lb

## 2015-04-25 DIAGNOSIS — W540XXA Bitten by dog, initial encounter: Secondary | ICD-10-CM | POA: Diagnosis not present

## 2015-04-25 DIAGNOSIS — M79642 Pain in left hand: Secondary | ICD-10-CM

## 2015-04-25 NOTE — Patient Instructions (Addendum)
You need to keep this wound clean with soap and water twice per day. If you experience any redness, swelling, increased pain, pus--you need to come directly back here, and let us know.    Animal Bite Animal bites can range from mild to serious. An animal bite can result in a scratch on the skin, a deep open cut, a puncture of the skin, a crush injury, or tearing away of the skin or a body part. A small bite from a house pet will usually not cause serious problems. However, some animal bites can become infected or injure a bone or other tissue.  Bites from certain animals can be more dangerous because of the risk of spreading rabies, which is a serious viral infection. This risk is higher with bites from stray animals or wild animals, such as raccoons, foxes, skunks, and bats. Dogs are responsible for most animal bites. Children are bitten more often than adults. SYMPTOMS  Common symptoms of an animal bite include:   Pain.   Bleeding.   Swelling.   Bruising.  DIAGNOSIS  This condition may be diagnosed based on a physical exam and medical history. Your health care provider will examine the wound and ask for details about the animal and how the bite happened. You may also have tests, such as:   Blood tests to check for infection or to determine if surgery is needed.  X-rays to check for damage to bones or joints.  Culture test. This uses a sample of fluid from the wound to check for infection. TREATMENT  Treatment varies depending on the location and type of animal bite and your medical history. Treatment may include:   Wound care. This often includes cleaning the wound, flushing the wound with saline solution, and applying a bandage (dressing). Sometimes, the wound is left open to heal because of the high risk of infection. However, in some cases, the wound may be closed with stitches (sutures), staples, skin glue, or adhesive strips.   Antibiotic medicine.   Tetanus shot.    Rabies treatment if the animal could have rabies.  In some cases, bites that have become infected may require IV antibiotics and surgical treatment in the hospital.  [place]  Follow instructions from your health care provider about how to take care of your wound. Make sure you:  Wash your hands with soap and water before you change your dressing. If soap and water are not available, use hand sanitizer.  Change your dressing as told by your health care provider.  Leave sutures, skin glue, or adhesive strips in place. These skin closures may need to be in place for 2 weeks or longer. If adhesive strip edges start to loosen and curl up, you may trim the loose edges. Do not remove adhesive strips completely unless your health care provider tells you to do that.  Check your wound every day for signs of infection. Watch for:   Increasing redness, swelling, or pain.   Fluid, blood, or pus.  General Instructions  Take or apply over-the-counter and prescription medicines only as told by your health care provider.   If you were prescribed an antibiotic, take or apply it as told by your health care provider. Do not stop using the antibiotic even if your condition improves.   Keep the injured area raised (elevated) above the level of your heart while you are sitting or lying down, if this is possible.   If directed, apply ice to the  injured area.   Put ice in a plastic bag.   Place a towel between your skin and the bag.   Leave the ice on for 20 minutes, 2-3 times per day.   Keep all follow-up visits as told by your health care provider. This is important.  SEEK MEDICAL CARE IF:  You have increasing redness, swelling, or pain at the site of your wound.   You have a general feeling of sickness (malaise).   You feel nauseous or you vomit.   You have pain that does not get better.  SEEK IMMEDIATE MEDICAL CARE IF:  You have a red  streak extending away from your wound.   You have fluid, blood, or pus coming from your wound.   You have a fever or chills.   You have trouble moving your injured area.   You have numbness or tingling extending beyond the wound.   This information is not intended to replace advice given to you by your health care provider. Make sure you discuss any questions you have with your health care provider.   Document Released: 01/06/2011 Document Revised: 01/09/2015 Document Reviewed: 09/05/2014 Elsevier Interactive Patient Education [employer].

## 2015-04-25 NOTE — Progress Notes (Signed)
[facility] [address] [place] [address] [phone]  Date:  04/25/2015   Name:  Darlene [name]   DOB:  04/24/1935   MRN:  [id number]  PCP:  Thressa Sheller, MD    History of Present Illness:  Darlene [name] is a 80 y.o. female patient who presents to [facility] for cc of left hand pain.  This morning, patient was trying to clean her dog's tail, when the dog nipped at her--puncturing her left hand.  She washed it with antibiotic soap, and contacted her pcp.  They stated that she should probably have it checked.  She has no complaints about the injury.  There is minimal pain.  No redness, swelling, numbness or tingling to the area.  There is no drainage as well.   Left dominant hand. Her dog has had rabies shot.  Tdap last received 5 months ago.    Patient Active Problem List   Diagnosis Date Noted  . DJD (degenerative joint disease) of knee 06/19/2013  . Right knee DJD   . Hyperlipidemia   . Thyroid disease   . Chest pain 12/26/2012  . Unstable angina ([place]) 12/03/2012  . Osteoporosis, unspecified 12/03/2012  . Palpitations 12/03/2012  . Dyslipidemia 12/03/2012  . History of breast cancer in female 07/06/2011  . History of non-Hodgkin's lymphoma 07/06/2011  . Hypothyroid 07/06/2011  . Chronic ITP (idiopathic thrombocytopenia) (HCC) 07/06/2011    Past Medical History  Diagnosis Date  . Chronic ITP (idiopathic thrombocytopenia) (HCC) 07/06/2011  . Hyperlipidemia     takes Lovastatin daily  . Right knee DJD   . Cancer of breast ([place])   . Lymphoma ([place])   . Breast cancer, stage 1, estrogen receptor negative ([place]) 07/06/2011  . Lymphoma of lymph nodes of head, face, and/or neck ([place]) 07/06/2011  . Lymphoma ([place])     B cell   . Constipation     takes Colace daily  . GERD (gastroesophageal reflux disease)     takes Nexium daily  . History of palpitations     takes Metoprolol daily  . Joint pain   . Joint swelling   . COPD (chronic obstructive  pulmonary disease) (HCC)     mild  . History of vertigo   . Osteopenia   . History of colon polyps   . Nocturia   . History of depression     received shock treatments   . Thyroid disease   . Hypothyroid 07/06/2011    takes SYnthroid daily  . Osteoporosis     hx of and take Reclast  . Umbilical hernia   . Peripheral neuropathy ([place])   . Shortness of breath dyspnea     with exertion  . Anxiety   . Depression     "nervous break down age 79"  . Neuropathy ([place])   . Cataract   . HOH (hard of hearing)     has hearing aids    Past Surgical History  Procedure Laterality Date  . Knee surgery    . Adenoidectomy      as a child  . Tonsillectomy      as a child  . Breast surgery Right   . Right  breast surgery      d/t breast cancer  . Bunionectomy Right   . Foot surgery Right   . Turbinate reduction    . Cardiac catheterization  2014  . Colonoscopy    . Total knee arthroplasty Right 06/19/2013  Procedure: TOTAL KNEE ARTHROPLASTY;  Surgeon: Lorn Junes, MD;  Location: [place];  Service: Orthopedics;  Laterality: Right;  . Upper gastrointestinal endoscopy    . Left heart catheterization with coronary angiogram N/A 12/05/2012    Procedure: LEFT HEART CATHETERIZATION WITH CORONARY ANGIOGRAM;  Surgeon: [name] Sine, MD;  Location: [facility] CATH LAB;  Service: Cardiovascular;  Laterality: N/A;  . Umbilical hernia repair N/A 04/17/2015    Procedure: LAPAROSCOPIC UMBILICAL HERNIA REPAIR WITH MESH;  Surgeon: Ralene Ok, MD;  Location: [place];  Service: General;  Laterality: N/A;  . Insertion of mesh N/A 04/17/2015    Procedure: INSERTION OF MESH;  Surgeon: Ralene Ok, MD;  Location: [place];  Service: General;  Laterality: N/A;    Social History  Substance Use Topics  . Smoking status: Former Smoker -- 0.30 packs/day    Types: Cigarettes  . Smokeless tobacco: Never Used     Comment: quit smoking 1985  . Alcohol Use: No    Family History  Problem Relation Age of Onset  .  Hyperlipidemia Father   . Heart disease Father   . Heart disease Sister   . Cancer Brother   . Diabetes Son   . Heart disease Maternal Grandfather   . Colon cancer Neg Hx     Allergies  Allergen Reactions  . Oxycodone Other (See Comments)    Made her depressed  . Codeine Itching           . Doxycycline Swelling and Other (See Comments)    Burns throat also   . Meperidine And Related Itching and Rash    Makes cheeks red also  . Sulfa Antibiotics Itching    Medication list has been reviewed and updated.  Current Outpatient Prescriptions on File Prior to Visit  Medication Sig Dispense Refill  . acetaminophen (TYLENOL) 500 MG tablet Take 1,000 mg by mouth 2 (two) times daily.    Marland Kitchen amoxicillin (AMOXIL) 500 MG capsule Take 500 mg by mouth. Take 4 capsules prior to dental procedures.    Marland Kitchen aspirin 81 MG tablet Take 81 mg by mouth daily.    . cholecalciferol (VITAMIN D) 1000 UNITS tablet Take 1,000 Units by mouth daily.    . citalopram (CELEXA) 40 MG tablet Take 40 mg by mouth daily.    . Cyanocobalamin (VITAMIN B-12 PO) Take 1,000 mcg by mouth daily.    Marland Kitchen esomeprazole (NEXIUM) 40 MG capsule Take 40 mg by mouth 2 (two) times daily before a meal.     . famciclovir (FAMVIR) 125 MG tablet Take 250 mg by mouth 2 (two) times daily as needed. For 5 days as needed for outbreak    . HYDROcodone-acetaminophen (NORCO/VICODIN) 5-325 MG tablet Take 1 tablet by mouth every 6 (six) hours as needed for moderate pain. 30 tablet 0  . L-Methylfolate-Algae-B12-B6 (METANX) 3-90.314-2-35 MG CAPS Take 1 tablet by mouth 2 (two) times daily.    Marland Kitchen levothyroxine (SYNTHROID, LEVOTHROID) 75 MCG tablet Take 75 mcg by mouth daily.      Marland Kitchen lovastatin (MEVACOR) 20 MG tablet Take 20 mg by mouth at bedtime.      Marland Kitchen LYRICA 50 MG capsule Take 50 mg by mouth 2 (two) times daily.     . metoprolol succinate (TOPROL-XL) 25 MG 24 hr tablet Take 25 mg by mouth daily.     . Multiple Vitamins-Minerals (MULTIVITAMINS THER.  W/MINERALS) TABS Take 1 tablet by mouth daily.      Vladimir Faster Glycol-Propyl Glycol (SYSTANE) 0.4-0.3 %  SOLN Apply 2 drops to eye 2 (two) times daily as needed (for dry eyes).    . Vitamin D, Ergocalciferol, (DRISDOL) 50000 UNITS CAPS Take 50,000 Units by mouth every 14 (fourteen) days.       No current facility-administered medications on file prior to visit.    ROS ROS otherwise unremarkable unless listed above.   Physical Examination: BP 110/58 mmHg  Pulse 68  Temp(Src) 97.8 F (36.6 C) (Oral)  Resp 20  Ht 5\' 1"  (1.549 m)  Wt 146 lb 12.8 oz (66.588 kg)  BMI 27.75 kg/m2  SpO2 97% Ideal Body Weight: Weight in (lb) to have BMI = 25: 132  Physical Exam  Constitutional: She is oriented to person, place, and time. She appears well-developed and well-nourished. No distress.  HENT:  Head: Normocephalic and atraumatic.  Right Ear: External ear normal.  Left Ear: External ear normal.  Eyes: Conjunctivae and EOM are normal. Pupils are equal, round, and reactive to light.  Cardiovascular: Normal rate.   Pulmonary/Chest: Effort normal. No respiratory distress.  Neurological: She is alert and oriented to person, place, and time.  Skin: Skin is warm and dry. She is not diaphoretic.  Tiny puncture like wound at the left 5th MTP.  No surrounding erythema or swelling.  No tenderness.  sensation intact.  No ROM.  Psychiatric: She has a normal mood and affect. Her behavior is normal.     Assessment and Plan: YOSHIE THIBAUT is a 80 y.o. female who is here today for chief complaint of dog bite.  Dog bite This does not appear infected.  Reluctant to use prophylactic antibiotic Augmentin, with risk of side effects.   We discussed alarming symptoms to warrant immediate return.  Advised to wash with soap and water.  Keep wound clean and away from dog saliva.  tdap utd  Ivar Drape, PA-C [facility] 04/25/2015 7:11 PM

## 2015-05-08 ENCOUNTER — Encounter: Payer: Self-pay | Admitting: Podiatry

## 2015-05-08 ENCOUNTER — Ambulatory Visit (INDEPENDENT_AMBULATORY_CARE_PROVIDER_SITE_OTHER): Payer: Medicare Other | Admitting: Podiatry

## 2015-05-08 DIAGNOSIS — M79676 Pain in unspecified toe(s): Secondary | ICD-10-CM | POA: Diagnosis not present

## 2015-05-08 DIAGNOSIS — B351 Tinea unguium: Secondary | ICD-10-CM | POA: Diagnosis not present

## 2015-05-08 NOTE — Progress Notes (Signed)
Patient ID: Darlene [name], female   DOB: 04-17-1935, 81 y.o.   MRN: [id number] Complaint:  Visit Type: Patient returns to my office for continued preventative foot care services. Complaint: Patient states" my nails have grown long and thick and become painful to walk and wear shoes".  He presents for preventative foot care services. No changes to ROS  Podiatric Exam: Vascular: dorsalis pedis and posterior tibial pulses are palpable bilateral. Capillary return is immediate. Temperature gradient is WNL. Skin turgor WNL  Sensorium: Normal Semmes Weinstein monofilament test. Normal tactile sensation bilaterally. Nail Exam: Pt has thick disfigured discolored nails with subungual debris noted bilateral entire nail hallux through fifth toenails Ulcer Exam: There is no evidence of ulcer or pre-ulcerative changes or infection. Orthopedic Exam: Muscle tone and strength are WNL. No limitations in general ROM. No crepitus or effusions noted. Foot type and digits show no abnormalities. Bony prominences are unremarkable. Skin: No Porokeratosis. No infection or ulcers  Diagnosis:  Tinea unguium, Pain in right toe, pain in left toes  Treatment & Plan Procedures and Treatment: Consent by patient was obtained for treatment procedures. The patient understood the discussion of treatment and procedures well. All questions were answered thoroughly reviewed. Debridement of mycotic and hypertrophic toenails, 1 through 5 bilateral and clearing of subungual debris. No ulceration, no infection noted.  Return Visit-Office Procedure: Patient instructed to return to the office for a follow up visit 3 months for continued evaluation and treatment.  Gardiner Barefoot DPM

## 2015-05-08 NOTE — Addendum Note (Signed)
Addended by: Ezzard Flax, Jazzmine Kleiman L on: 05/08/2015 03:14 PM   Modules accepted: Orders, Medications

## 2015-05-17 DIAGNOSIS — J449 Chronic obstructive pulmonary disease, unspecified: Secondary | ICD-10-CM | POA: Diagnosis not present

## 2015-05-17 DIAGNOSIS — D696 Thrombocytopenia, unspecified: Secondary | ICD-10-CM | POA: Diagnosis not present

## 2015-05-17 DIAGNOSIS — E785 Hyperlipidemia, unspecified: Secondary | ICD-10-CM | POA: Diagnosis not present

## 2015-05-17 DIAGNOSIS — F3341 Major depressive disorder, recurrent, in partial remission: Secondary | ICD-10-CM | POA: Diagnosis not present

## 2015-05-17 DIAGNOSIS — M810 Age-related osteoporosis without current pathological fracture: Secondary | ICD-10-CM | POA: Diagnosis not present

## 2015-05-24 DIAGNOSIS — D696 Thrombocytopenia, unspecified: Secondary | ICD-10-CM | POA: Diagnosis not present

## 2015-05-24 DIAGNOSIS — F17211 Nicotine dependence, cigarettes, in remission: Secondary | ICD-10-CM | POA: Diagnosis not present

## 2015-05-24 DIAGNOSIS — F339 Major depressive disorder, recurrent, unspecified: Secondary | ICD-10-CM | POA: Diagnosis not present

## 2015-05-24 DIAGNOSIS — C8591 Non-Hodgkin lymphoma, unspecified, lymph nodes of head, face, and neck: Secondary | ICD-10-CM | POA: Diagnosis not present

## 2015-05-24 DIAGNOSIS — J449 Chronic obstructive pulmonary disease, unspecified: Secondary | ICD-10-CM | POA: Diagnosis not present

## 2015-06-11 DIAGNOSIS — Z96651 Presence of right artificial knee joint: Secondary | ICD-10-CM | POA: Diagnosis not present

## 2015-07-09 ENCOUNTER — Telehealth: Payer: Self-pay | Admitting: Hematology and Oncology

## 2015-07-09 ENCOUNTER — Ambulatory Visit (HOSPITAL_BASED_OUTPATIENT_CLINIC_OR_DEPARTMENT_OTHER): Payer: Medicare Other | Admitting: Hematology and Oncology

## 2015-07-09 ENCOUNTER — Other Ambulatory Visit: Payer: Self-pay | Admitting: Hematology and Oncology

## 2015-07-09 ENCOUNTER — Other Ambulatory Visit (HOSPITAL_BASED_OUTPATIENT_CLINIC_OR_DEPARTMENT_OTHER): Payer: Medicare Other

## 2015-07-09 VITALS — BP 111/68 | HR 63 | Temp 98.0°F | Resp 17 | Wt 137.6 lb

## 2015-07-09 DIAGNOSIS — Z8572 Personal history of non-Hodgkin lymphomas: Secondary | ICD-10-CM | POA: Diagnosis not present

## 2015-07-09 DIAGNOSIS — Z853 Personal history of malignant neoplasm of breast: Secondary | ICD-10-CM

## 2015-07-09 DIAGNOSIS — D693 Immune thrombocytopenic purpura: Secondary | ICD-10-CM | POA: Diagnosis not present

## 2015-07-09 LAB — CBC WITH DIFFERENTIAL/PLATELET
BASO%: 0.4 % (ref 0.0–2.0)
Basophils Absolute: 0 10*3/uL (ref 0.0–0.1)
EOS ABS: 0.1 10*3/uL (ref 0.0–0.5)
EOS%: 1.8 % (ref 0.0–7.0)
HEMATOCRIT: 35.1 % (ref 34.8–46.6)
HEMOGLOBIN: 11.5 g/dL — AB (ref 11.6–15.9)
LYMPH#: 1.5 10*3/uL (ref 0.9–3.3)
LYMPH%: 22.5 % (ref 14.0–49.7)
MCH: 29.3 pg (ref 25.1–34.0)
MCHC: 32.8 g/dL (ref 31.5–36.0)
MCV: 89.3 fL (ref 79.5–101.0)
MONO#: 0.4 10*3/uL (ref 0.1–0.9)
MONO%: 5.9 % (ref 0.0–14.0)
NEUT%: 69.4 % (ref 38.4–76.8)
NEUTROS ABS: 4.7 10*3/uL (ref 1.5–6.5)
NRBC: 0 % (ref 0–0)
Platelets: 101 10*3/uL — ABNORMAL LOW (ref 145–400)
RBC: 3.93 10*6/uL (ref 3.70–5.45)
RDW: 14.4 % (ref 11.2–14.5)
WBC: 6.8 10*3/uL (ref 3.9–10.3)

## 2015-07-09 NOTE — Telephone Encounter (Signed)
Gave and printed appt sched and avs fo rpt for March 2018

## 2015-07-11 ENCOUNTER — Encounter: Payer: Self-pay | Admitting: Hematology and Oncology

## 2015-07-11 NOTE — Assessment & Plan Note (Signed)
Clinically, she have no signs of recurrence. Her last mammogram showed a new cyst in the left breast and is being monitored. She will continue screening mammogram

## 2015-07-11 NOTE — Progress Notes (Signed)
[place] OFFICE PROGRESS NOTE  Patient Care Team: Thressa Sheller, MD as PCP - General (Internal Medicine)  SUMMARY OF ONCOLOGIC HISTORY:  Darlene [name] 81 y.o. female was transferred to my care after her prior physician has left.  I reviewed the patient's records extensive and collaborated the history with the patient. 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 She never required any systemic treatment. 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 This has also resolved on its own. She has never developed any additional adenopathy other than the single lymph node palpable in the left supraclavicular fossa.  She did develop a second primary stage I,  0.8 cm,  ER negative cancer of the right breast, treated with lumpectomy, radiation and six cycles of CMF chemotherapy in 1994. No evidence of a new disease from the breast cancer either. Most recent mammogram done in June 2015 showed stable fibroglandular changes in her breasts. She was called back for an ultrasound of the left breast which also came back benign. INTERVAL HISTORY: Please see below for problem oriented charting. The patient is coping well since the death of her husband.  She denies new lymphadenopathy. No recent infection. She denies any recent abnormal breast examination, palpable mass, abnormal breast appearance or nipple changes The patient denies any recent signs or symptoms of bleeding such as spontaneous epistaxis, hematuria or hematochezia. She is enrolled in weight loss program and had successfully lost some weight intentionally  REVIEW OF SYSTEMS:   Constitutional: Denies fevers, chills or abnormal weight loss Eyes: Denies blurriness of vision Ears, nose, mouth, throat, and face:  Denies mucositis or sore throat Respiratory: Denies cough, dyspnea or wheezes Cardiovascular: Denies palpitation, chest discomfort or lower extremity swelling Gastrointestinal:  Denies nausea, heartburn or change in bowel habits Skin: Denies abnormal skin rashes Lymphatics: Denies new lymphadenopathy or easy bruising Neurological:Denies numbness, tingling or new weaknesses Behavioral/Psych: Mood is stable, no new changes  All other systems were reviewed with the patient and are negative.  I have reviewed the past medical history, past surgical history, social history and family history with the patient and they are unchanged from previous note.  ALLERGIES:  is allergic to oxycodone; codeine; doxycycline; meperidine and related; and sulfa antibiotics.  MEDICATIONS:  Current Outpatient Prescriptions  Medication Sig Dispense Refill  . acetaminophen (TYLENOL) 500 MG tablet Take 1,000 mg by mouth 2 (two) times daily.    Marland Kitchen aspirin 81 MG tablet Take 81 mg by mouth daily.    . cholecalciferol (VITAMIN D) 1000 UNITS tablet Take 1,000 Units by mouth daily.    . citalopram (CELEXA) 40 MG tablet Take 40 mg by mouth daily.    . Cyanocobalamin (VITAMIN B-12 PO) Take 1,000 mcg by mouth daily.    Marland Kitchen esomeprazole (NEXIUM) 40 MG capsule Take 40 mg by mouth 2 (two) times daily before a meal.     . famciclovir (FAMVIR) 125 MG tablet Take 250 mg by mouth 2 (two) times daily as needed. For 5 days as needed for outbreak    . L-Methylfolate-Algae-B12-B6 (METANX) 3-90.314-2-35 MG CAPS Take 1 tablet by mouth 2 (two) times daily.    Marland Kitchen levothyroxine (SYNTHROID, LEVOTHROID) 75 MCG tablet Take 75 mcg by mouth daily.      Marland Kitchen lovastatin (MEVACOR) 20 MG tablet Take 20 mg by mouth  at bedtime.      Marland Kitchen LYRICA 50 MG capsule Take 50 mg by mouth 2 (two) times daily.     . metoprolol succinate (TOPROL-XL) 25 MG 24 hr tablet Take 25 mg by mouth daily.     . Multiple Vitamins-Minerals (MULTIVITAMINS THER. W/MINERALS) TABS Take 1  tablet by mouth daily.      Vladimir Faster Glycol-Propyl Glycol (SYSTANE) 0.4-0.3 % SOLN Apply 2 drops to eye 2 (two) times daily as needed (for dry eyes).    . Vitamin D, Ergocalciferol, (DRISDOL) 50000 UNITS CAPS Take 50,000 Units by mouth every 14 (fourteen) days.      Marland Kitchen amoxicillin (AMOXIL) 500 MG capsule Take 500 mg by mouth. Reported on 07/09/2015     No current facility-administered medications for this visit.    PHYSICAL EXAMINATION: ECOG PERFORMANCE STATUS: 1 - Symptomatic but completely ambulatory  Filed Vitals:   07/09/15 1140  BP: 111/68  Pulse: 63  Temp: 98 F (36.7 C)  Resp: 17   Filed Weights   07/09/15 1140  Weight: 137 lb 9.6 oz (62.415 kg)    GENERAL:alert, no distress and comfortable SKIN: skin color, texture, turgor are normal, no rashes or significant lesions EYES: normal, Conjunctiva are pink and non-injected, sclera clear OROPHARYNX:no exudate, no erythema and lips, buccal mucosa, and tongue normal  NECK: supple, thyroid normal size, non-tender, without nodularity LYMPH:  no palpable lymphadenopathy in the cervical, axillary or inguinal LUNGS: clear to auscultation and percussion with normal breathing effort HEART: regular rate & rhythm and no murmurs and no lower extremity edema ABDOMEN:abdomen soft, non-tender and normal bowel sounds Musculoskeletal:no cyanosis of digits and no clubbing  NEURO: alert & oriented x 3 with fluent speech, no focal motor/sensory deficits Negative bilateral breast examination LABORATORY DATA:  I have reviewed the data as listed    Component Value Date/Time   NA 139 04/17/2015 0757   NA 134* 07/12/2014 1248   K 3.8 04/17/2015 0757   K 4.0 07/12/2014 1248   CL 106 04/17/2015 0757   CL 106 07/11/2012 0906   CO2 25 04/17/2015 0757   CO2 23 07/12/2014 1248   GLUCOSE 97 04/17/2015 0757   GLUCOSE 128 07/12/2014 1248   GLUCOSE 107* 07/11/2012 0906   BUN 16 04/17/2015 0757   BUN 13.6 07/12/2014 1248   CREATININE 0.62  04/17/2015 0757   CREATININE 0.7 07/12/2014 1248   CALCIUM 10.4* 04/17/2015 0757   CALCIUM 9.6 07/12/2014 1248   PROT 6.4 07/12/2014 1248   PROT 7.1 06/09/2013 1156   ALBUMIN 3.8 07/12/2014 1248   ALBUMIN 3.7 06/09/2013 1156   AST 11 07/12/2014 1248   AST 13 06/09/2013 1156   ALT 15 07/12/2014 1248   ALT 16 06/09/2013 1156   ALKPHOS 49 07/12/2014 1248   ALKPHOS 59 06/09/2013 1156   BILITOT 0.66 07/12/2014 1248   BILITOT 0.5 06/09/2013 1156   GFRNONAA >60 04/17/2015 0757   GFRAA >60 04/17/2015 0757    No results found for: SPEP, UPEP  Lab Results  Component Value Date   WBC 6.8 07/09/2015   NEUTROABS 4.7 07/09/2015   HGB 11.5* 07/09/2015   HCT 35.1 07/09/2015   MCV 89.3 07/09/2015   PLT 101* 07/09/2015      Chemistry      Component Value Date/Time   NA 139 04/17/2015 0757   NA 134* 07/12/2014 1248   K 3.8 04/17/2015 0757   K 4.0 07/12/2014 1248   CL 106 04/17/2015 0757   CL 106 07/11/2012  0906   CO2 25 04/17/2015 0757   CO2 23 07/12/2014 1248   BUN 16 04/17/2015 0757   BUN 13.6 07/12/2014 1248   CREATININE 0.62 04/17/2015 0757   CREATININE 0.7 07/12/2014 1248      Component Value Date/Time   CALCIUM 10.4* 04/17/2015 0757   CALCIUM 9.6 07/12/2014 1248   ALKPHOS 49 07/12/2014 1248   ALKPHOS 59 06/09/2013 1156   AST 11 07/12/2014 1248   AST 13 06/09/2013 1156   ALT 15 07/12/2014 1248   ALT 16 06/09/2013 1156   BILITOT 0.66 07/12/2014 1248   BILITOT 0.5 06/09/2013 1156       ASSESSMENT & PLAN:  History of breast cancer in female Clinically, she have no signs of recurrence. Her last mammogram showed a new cyst in the left breast and is being monitored. She will continue screening mammogram    History of non-Hodgkin's lymphoma Clinically, she have no signs of lymphoma recurrence. I will continue history, physical examination blood work and yearly examination.    Chronic ITP (idiopathic thrombocytopenia) (HCC) The cause is likely ITP. It is mild  and there is little change compared from previous platelet count. The patient denies recent history of bleeding such as epistaxis, hematuria or hematochezia. She is asymptomatic from the thrombocytopenia. I will observe for now   Orders Placed This Encounter  Procedures  . CBC with Differential/Platelet    Standing Status: Future     Number of Occurrences:      Standing Expiration Date: 08/12/2016   All questions were answered. The patient knows to call the clinic with any problems, questions or concerns. No barriers to learning was detected. I spent 15 minutes counseling the patient face to face. The total time spent in the appointment was 20 minutes and more than 50% was on counseling and review of test results     [facility], [place], MD 07/11/2015 7:07 AM

## 2015-07-11 NOTE — Assessment & Plan Note (Signed)
Clinically, she have no signs of lymphoma recurrence. I will continue history, physical examination blood work and yearly examination. 

## 2015-07-11 NOTE — Assessment & Plan Note (Signed)
The cause is likely ITP. It is mild and there is little change compared from previous platelet count. The patient denies recent history of bleeding such as epistaxis, hematuria or hematochezia. She is asymptomatic from the thrombocytopenia. I will observe for now 

## 2015-07-24 ENCOUNTER — Other Ambulatory Visit: Payer: Self-pay

## 2015-07-24 DIAGNOSIS — Z1231 Encounter for screening mammogram for malignant neoplasm of breast: Secondary | ICD-10-CM

## 2015-07-31 ENCOUNTER — Ambulatory Visit (INDEPENDENT_AMBULATORY_CARE_PROVIDER_SITE_OTHER): Payer: Medicare Other | Admitting: Podiatry

## 2015-07-31 ENCOUNTER — Encounter: Payer: Self-pay | Admitting: Podiatry

## 2015-07-31 DIAGNOSIS — B351 Tinea unguium: Secondary | ICD-10-CM

## 2015-07-31 DIAGNOSIS — M79676 Pain in unspecified toe(s): Secondary | ICD-10-CM | POA: Diagnosis not present

## 2015-07-31 NOTE — Progress Notes (Signed)
Patient ID: Darlene [name], female   DOB: 1934/08/06, 81 y.o.   MRN: [id number] Complaint:  Visit Type: Patient returns to my office for continued preventative foot care services. Complaint: Patient states" my nails have grown long and thick and become painful to walk and wear shoes".  He presents for preventative foot care services. No changes to ROS  Podiatric Exam: Vascular: dorsalis pedis and posterior tibial pulses are palpable bilateral. Capillary return is immediate. Temperature gradient is WNL. Skin turgor WNL  Sensorium: Normal Semmes Weinstein monofilament test. Normal tactile sensation bilaterally. Nail Exam: Pt has thick disfigured discolored nails with subungual debris noted bilateral entire nail hallux through fifth toenails Ulcer Exam: There is no evidence of ulcer or pre-ulcerative changes or infection. Orthopedic Exam: Muscle tone and strength are WNL. No limitations in general ROM. No crepitus or effusions noted. Foot type and digits show no abnormalities. Bony prominences are unremarkable. Skin: No Porokeratosis. No infection or ulcers  Diagnosis:  Tinea unguium, Pain in right toe, pain in left toes  Treatment & Plan Procedures and Treatment: Consent by patient was obtained for treatment procedures. The patient understood the discussion of treatment and procedures well. All questions were answered thoroughly reviewed. Debridement of mycotic and hypertrophic toenails, 1 through 5 bilateral and clearing of subungual debris. No ulceration, no infection noted.  Return Visit-Office Procedure: Patient instructed to return to the office for a follow up visit 3 months for continued evaluation and treatment.  Gardiner Barefoot DPM

## 2015-08-08 DIAGNOSIS — R209 Unspecified disturbances of skin sensation: Secondary | ICD-10-CM | POA: Diagnosis not present

## 2015-08-08 DIAGNOSIS — Z85828 Personal history of other malignant neoplasm of skin: Secondary | ICD-10-CM | POA: Diagnosis not present

## 2015-08-08 DIAGNOSIS — L814 Other melanin hyperpigmentation: Secondary | ICD-10-CM | POA: Diagnosis not present

## 2015-08-08 DIAGNOSIS — D1801 Hemangioma of skin and subcutaneous tissue: Secondary | ICD-10-CM | POA: Diagnosis not present

## 2015-08-13 DIAGNOSIS — H2513 Age-related nuclear cataract, bilateral: Secondary | ICD-10-CM | POA: Diagnosis not present

## 2015-08-13 DIAGNOSIS — H52203 Unspecified astigmatism, bilateral: Secondary | ICD-10-CM | POA: Diagnosis not present

## 2015-10-10 ENCOUNTER — Ambulatory Visit
Admission: RE | Admit: 2015-10-10 | Discharge: 2015-10-10 | Disposition: A | Discharge status: Home / Self Care | Payer: Medicare Other | Source: Ambulatory Visit

## 2015-10-10 DIAGNOSIS — Z1231 Encounter for screening mammogram for malignant neoplasm of breast: Secondary | ICD-10-CM

## 2015-10-21 ENCOUNTER — Other Ambulatory Visit (HOSPITAL_COMMUNITY): Payer: Self-pay | Admitting: Internal Medicine

## 2015-10-21 DIAGNOSIS — R590 Localized enlarged lymph nodes: Secondary | ICD-10-CM

## 2015-10-21 DIAGNOSIS — R109 Unspecified abdominal pain: Secondary | ICD-10-CM

## 2015-10-21 DIAGNOSIS — J449 Chronic obstructive pulmonary disease, unspecified: Secondary | ICD-10-CM | POA: Diagnosis not present

## 2015-10-22 DIAGNOSIS — C8595 Non-Hodgkin lymphoma, unspecified, lymph nodes of inguinal region and lower limb: Secondary | ICD-10-CM | POA: Diagnosis not present

## 2015-10-22 DIAGNOSIS — I700 Atherosclerosis of aorta: Secondary | ICD-10-CM | POA: Diagnosis not present

## 2015-10-22 DIAGNOSIS — R1010 Upper abdominal pain, unspecified: Secondary | ICD-10-CM | POA: Diagnosis not present

## 2015-10-25 DIAGNOSIS — H903 Sensorineural hearing loss, bilateral: Secondary | ICD-10-CM | POA: Diagnosis not present

## 2015-10-30 ENCOUNTER — Ambulatory Visit (INDEPENDENT_AMBULATORY_CARE_PROVIDER_SITE_OTHER): Payer: Medicare Other | Admitting: Podiatry

## 2015-10-30 DIAGNOSIS — M79676 Pain in unspecified toe(s): Secondary | ICD-10-CM | POA: Diagnosis not present

## 2015-10-30 DIAGNOSIS — B351 Tinea unguium: Secondary | ICD-10-CM | POA: Diagnosis not present

## 2015-10-30 NOTE — Progress Notes (Signed)
Patient ID: Darlene [name], female   DOB: 1934-11-07, 81 y.o.   MRN: [id number] Complaint:  Visit Type: Patient returns to my office for continued preventative foot care services. Complaint: Patient states" my nails have grown long and thick and become painful to walk and wear shoes".  He presents for preventative foot care services. No changes to ROS  Podiatric Exam: Vascular: dorsalis pedis and posterior tibial pulses are palpable bilateral. Capillary return is immediate. Temperature gradient is WNL. Skin turgor WNL  Sensorium: Normal Semmes Weinstein monofilament test. Normal tactile sensation bilaterally. Nail Exam: Pt has thick disfigured discolored nails with subungual debris noted bilateral entire nail hallux through fifth toenails Ulcer Exam: There is no evidence of ulcer or pre-ulcerative changes or infection. Orthopedic Exam: Muscle tone and strength are WNL. No limitations in general ROM. No crepitus or effusions noted. Foot type and digits show no abnormalities. Bony prominences are unremarkable. Skin: No Porokeratosis. No infection or ulcers  Diagnosis:  Tinea unguium, Pain in right toe, pain in left toes  Treatment & Plan Procedures and Treatment: Consent by patient was obtained for treatment procedures. The patient understood the discussion of treatment and procedures well. All questions were answered thoroughly reviewed. Debridement of mycotic and hypertrophic toenails, 1 through 5 bilateral and clearing of subungual debris. No ulceration, no infection noted.  Return Visit-Office Procedure: Patient instructed to return to the office for a follow up visit 3 months for continued evaluation and treatment.  Gardiner Barefoot DPM

## 2015-11-08 ENCOUNTER — Other Ambulatory Visit: Payer: Self-pay | Admitting: Internal Medicine

## 2015-11-08 DIAGNOSIS — R590 Localized enlarged lymph nodes: Secondary | ICD-10-CM

## 2015-11-14 ENCOUNTER — Telehealth: Payer: Self-pay | Admitting: Hematology and Oncology

## 2015-11-14 ENCOUNTER — Ambulatory Visit
Admission: RE | Admit: 2015-11-14 | Discharge: 2015-11-14 | Disposition: A | Discharge status: Home / Self Care | Payer: Medicare Other | Source: Ambulatory Visit | Attending: Internal Medicine | Admitting: Internal Medicine

## 2015-11-14 ENCOUNTER — Telehealth: Payer: Self-pay | Admitting: *Deleted

## 2015-11-14 DIAGNOSIS — E079 Disorder of thyroid, unspecified: Secondary | ICD-10-CM

## 2015-11-14 DIAGNOSIS — E038 Other specified hypothyroidism: Secondary | ICD-10-CM | POA: Insufficient documentation

## 2015-11-14 DIAGNOSIS — Z8572 Personal history of non-Hodgkin lymphomas: Secondary | ICD-10-CM

## 2015-11-14 DIAGNOSIS — Z853 Personal history of malignant neoplasm of breast: Secondary | ICD-10-CM

## 2015-11-14 DIAGNOSIS — M810 Age-related osteoporosis without current pathological fracture: Secondary | ICD-10-CM

## 2015-11-14 DIAGNOSIS — K6389 Other specified diseases of intestine: Secondary | ICD-10-CM | POA: Diagnosis not present

## 2015-11-14 DIAGNOSIS — E785 Hyperlipidemia, unspecified: Secondary | ICD-10-CM

## 2015-11-14 DIAGNOSIS — R590 Localized enlarged lymph nodes: Secondary | ICD-10-CM

## 2015-11-14 MED ORDER — IOPAMIDOL (ISOVUE-300) INJECTION 61%
100.0000 mL | Freq: Once | INTRAVENOUS | Status: AC | PRN
  Administered 2015-11-14: 100 mL via INTRAVENOUS

## 2015-11-14 NOTE — Telephone Encounter (Signed)
Notified pt Dr. Alvy Bimler sent request to scheduler to see her on Monday 7/17.   Come at 11:15 am for lab and need to fast for labs since we will draw Lipid panel and other labs for [facility].  Pt verbalized understanding.

## 2015-11-14 NOTE — Telephone Encounter (Signed)
Call from [place], Helayne Seminole, at [facility]  She wants to alert Dr. Alvy Bimler to CT scan results.   Dr. Alvy Bimler reviewed results and she will see pt on Monday 7/17 at noon with lab to review Scan.   Pt is scheduled for labs at [facility] on 7/20.  S/w Helayne Seminole and informed her we can draw labs here so pt doesn't have to get stuck twice.  Ordered all labs that are ordered at [facility] along w/ labs needed by Dr. Alvy Bimler.  Will fax lab results to Helayne Seminole at fax [phone].

## 2015-11-14 NOTE — Telephone Encounter (Signed)
cld & left pt a message of appt on 7/17@11 :30-

## 2015-11-18 ENCOUNTER — Other Ambulatory Visit (HOSPITAL_BASED_OUTPATIENT_CLINIC_OR_DEPARTMENT_OTHER): Payer: Medicare Other

## 2015-11-18 ENCOUNTER — Ambulatory Visit (HOSPITAL_BASED_OUTPATIENT_CLINIC_OR_DEPARTMENT_OTHER): Payer: Medicare Other | Admitting: Hematology and Oncology

## 2015-11-18 VITALS — BP 126/60 | HR 57 | Temp 97.4°F | Resp 18 | Ht 61.0 in | Wt 126.2 lb

## 2015-11-18 DIAGNOSIS — K5909 Other constipation: Secondary | ICD-10-CM

## 2015-11-18 DIAGNOSIS — D693 Immune thrombocytopenic purpura: Secondary | ICD-10-CM

## 2015-11-18 DIAGNOSIS — Z8572 Personal history of non-Hodgkin lymphomas: Secondary | ICD-10-CM

## 2015-11-18 DIAGNOSIS — E038 Other specified hypothyroidism: Secondary | ICD-10-CM | POA: Diagnosis not present

## 2015-11-18 DIAGNOSIS — Z853 Personal history of malignant neoplasm of breast: Secondary | ICD-10-CM

## 2015-11-18 DIAGNOSIS — K5900 Constipation, unspecified: Secondary | ICD-10-CM | POA: Diagnosis not present

## 2015-11-18 DIAGNOSIS — E079 Disorder of thyroid, unspecified: Secondary | ICD-10-CM

## 2015-11-18 DIAGNOSIS — M810 Age-related osteoporosis without current pathological fracture: Secondary | ICD-10-CM

## 2015-11-18 DIAGNOSIS — E785 Hyperlipidemia, unspecified: Secondary | ICD-10-CM

## 2015-11-18 DIAGNOSIS — R634 Abnormal weight loss: Secondary | ICD-10-CM | POA: Diagnosis not present

## 2015-11-18 LAB — URINALYSIS, MICROSCOPIC - CHCC
BILIRUBIN (URINE): NEGATIVE
Glucose: NEGATIVE mg/dL
KETONES: NEGATIVE mg/dL
Leukocyte Esterase: NEGATIVE
Nitrite: NEGATIVE
Protein: NEGATIVE mg/dL
SPECIFIC GRAVITY, URINE: 1.005 (ref 1.003–1.035)
Urobilinogen, UR: 0.2 mg/dL (ref 0.2–1)
pH: 8 (ref 4.6–8.0)

## 2015-11-18 LAB — CBC WITH DIFFERENTIAL/PLATELET
BASO%: 0.8 % (ref 0.0–2.0)
Basophils Absolute: 0 10*3/uL (ref 0.0–0.1)
EOS%: 2 % (ref 0.0–7.0)
Eosinophils Absolute: 0.1 10*3/uL (ref 0.0–0.5)
HEMATOCRIT: 36.9 % (ref 34.8–46.6)
HGB: 12.1 g/dL (ref 11.6–15.9)
LYMPH#: 1.3 10*3/uL (ref 0.9–3.3)
LYMPH%: 26.3 % (ref 14.0–49.7)
MCH: 30.2 pg (ref 25.1–34.0)
MCHC: 32.8 g/dL (ref 31.5–36.0)
MCV: 92 fL (ref 79.5–101.0)
MONO#: 0.3 10*3/uL (ref 0.1–0.9)
MONO%: 6.2 % (ref 0.0–14.0)
NEUT%: 64.7 % (ref 38.4–76.8)
NEUTROS ABS: 3.2 10*3/uL (ref 1.5–6.5)
PLATELETS: 130 10*3/uL — AB (ref 145–400)
RBC: 4.01 10*6/uL (ref 3.70–5.45)
RDW: 13.4 % (ref 11.2–14.5)
WBC: 5 10*3/uL (ref 3.9–10.3)

## 2015-11-18 LAB — COMPREHENSIVE METABOLIC PANEL
ALT: 18 U/L (ref 0–55)
ANION GAP: 9 meq/L (ref 3–11)
AST: 15 U/L (ref 5–34)
Albumin: 4.2 g/dL (ref 3.5–5.0)
Alkaline Phosphatase: 48 U/L (ref 40–150)
BILIRUBIN TOTAL: 0.99 mg/dL (ref 0.20–1.20)
BUN: 16.5 mg/dL (ref 7.0–26.0)
CHLORIDE: 106 meq/L (ref 98–109)
CO2: 25 meq/L (ref 22–29)
CREATININE: 0.7 mg/dL (ref 0.6–1.1)
Calcium: 10.3 mg/dL (ref 8.4–10.4)
EGFR: 81 mL/min/{1.73_m2} — ABNORMAL LOW (ref >90)
Glucose: 93 mg/dl (ref 70–140)
Potassium: 4.1 mEq/L (ref 3.5–5.1)
Sodium: 139 mEq/L (ref 136–145)
TOTAL PROTEIN: 7 g/dL (ref 6.4–8.3)

## 2015-11-18 LAB — TSH: TSH: 1.332 m[IU]/L (ref 0.308–3.960)

## 2015-11-18 LAB — LACTATE DEHYDROGENASE: LDH: 202 U/L (ref 125–245)

## 2015-11-18 LAB — MAGNESIUM: MAGNESIUM: 2.4 mg/dL (ref 1.5–2.5)

## 2015-11-18 NOTE — Progress Notes (Signed)
[place] OFFICE PROGRESS NOTE  Patient Care Team: Thressa Sheller, MD as PCP - General (Internal Medicine)  SUMMARY OF ONCOLOGIC HISTORY:  Darlene [name] was transferred to my care after her prior physician has left.  I reviewed the patient's records extensive and collaborated the history with the patient. 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 She never required any systemic treatment. 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 This has also resolved on its own. She has never developed any additional adenopathy other than the single lymph node palpable in the left supraclavicular fossa.  She did develop a second primary stage I,  0.8 cm,  ER negative cancer of the right breast, treated with lumpectomy, radiation and six cycles of CMF chemotherapy in 1994. No evidence of a new disease from the breast cancer either. Most recent mammogram done in June 2015 showed stable fibroglandular changes in her breasts. She was called back for an ultrasound of the left breast which also came back benign. INTERVAL HISTORY: Please see below for problem oriented charting. She denies new lymphadenopathy. No recent infection. She denies any recent abnormal breast examination, palpable mass, abnormal breast appearance or nipple changes The patient denies any recent signs or symptoms of bleeding such as spontaneous epistaxis, hematuria or hematochezia. She is enrolled in weight loss program and had successfully lost some weight intentionally She had recent lower abdominal discomfort that comes and goes.  Since her weight loss program, she has developed chronic constipation She denies nausea or vomiting She thought she felt a lump on the right lower quadrant She had CT  imaging done through her primary care physician's office and urgent request is made to be seen  REVIEW OF SYSTEMS:   Constitutional: Denies fevers, chills  Eyes: Denies blurriness of vision Ears, nose, mouth, throat, and face: Denies mucositis or sore throat Respiratory: Denies cough, dyspnea or wheezes Cardiovascular: Denies palpitation, chest discomfort or lower extremity swelling Skin: Denies abnormal skin rashes Lymphatics: Denies new lymphadenopathy or easy bruising Neurological:Denies numbness, tingling or new weaknesses Behavioral/Psych: Mood is stable, no new changes  All other systems were reviewed with the patient and are negative.  I have reviewed the past medical history, past surgical history, social history and family history with the patient and they are unchanged from previous note.  ALLERGIES:  is allergic to oxycodone; codeine; doxycycline; meperidine and related; and sulfa antibiotics.  MEDICATIONS:  Current Outpatient Prescriptions  Medication Sig Dispense Refill  . acetaminophen (TYLENOL) 500 MG tablet Take 1,000 mg by mouth 2 (two) times daily.    Marland Kitchen aspirin 81 MG tablet Take 81 mg by mouth daily.    . cholecalciferol (VITAMIN D) 1000 UNITS tablet Take 1,000 Units by mouth daily.    . citalopram (CELEXA) 40 MG tablet Take 40 mg by mouth daily.    . Cyanocobalamin (VITAMIN B-12 PO) Take 1,000 mcg by mouth daily.    Marland Kitchen esomeprazole (NEXIUM) 40 MG capsule Take 40 mg by mouth 2 (two) times daily before a meal.     . famciclovir (FAMVIR) 125 MG tablet Take 250 mg by mouth 2 (two) times daily as needed. For 5 days as needed for outbreak    . L-Methylfolate-Algae-B12-B6 (METANX) 3-90.314-2-35 MG CAPS Take 1 tablet by mouth 2 (two) times daily.    Marland Kitchen  levothyroxine (SYNTHROID, LEVOTHROID) 75 MCG tablet Take 75 mcg by mouth daily.      Marland Kitchen lovastatin (MEVACOR) 20 MG tablet Take 20 mg by mouth at bedtime.      Marland Kitchen LYRICA 50 MG capsule Take 50 mg by mouth 2 (two) times daily.     .  metoprolol succinate (TOPROL-XL) 25 MG 24 hr tablet Take 25 mg by mouth daily.     . Multiple Vitamins-Minerals (MULTIVITAMINS THER. W/MINERALS) TABS Take 1 tablet by mouth daily.      Vladimir Faster Glycol-Propyl Glycol (SYSTANE) 0.4-0.3 % SOLN Apply 2 drops to eye 2 (two) times daily as needed (for dry eyes).    . Vitamin D, Ergocalciferol, (DRISDOL) 50000 UNITS CAPS Take 50,000 Units by mouth every 14 (fourteen) days.      Marland Kitchen amoxicillin (AMOXIL) 500 MG capsule Take 500 mg by mouth. Reported on 11/18/2015     No current facility-administered medications for this visit.    PHYSICAL EXAMINATION: ECOG PERFORMANCE STATUS: 1 - Symptomatic but completely ambulatory  Filed Vitals:   11/18/15 1151  BP: 126/60  Pulse: 57  Temp: 97.4 F (36.3 C)  Resp: 18   Filed Weights   11/18/15 1151  Weight: 126 lb 3.2 oz (57.244 kg)    GENERAL:alert, no distress and comfortable SKIN: skin color, texture, turgor are normal, no rashes or significant lesions EYES: normal, Conjunctiva are pink and non-injected, sclera clear OROPHARYNX:no exudate, no erythema and lips, buccal mucosa, and tongue normal  NECK: supple, thyroid normal size, non-tender, without nodularity LYMPH:  no palpable lymphadenopathy in the cervical, axillary or inguinal LUNGS: clear to auscultation and percussion with normal breathing effort HEART: regular rate & rhythm and no murmurs and no lower extremity edema ABDOMEN:abdomen soft, non-tender and normal bowel sounds. Small hernia palpated right lower quadrant when she coughs. Ventral hernia is present Musculoskeletal:no cyanosis of digits and no clubbing  NEURO: alert & oriented x 3 with fluent speech, no focal motor/sensory deficits  LABORATORY DATA:  I have reviewed the data as listed    Component Value Date/Time   NA 139 11/18/2015 1117   NA 139 04/17/2015 0757   K 4.1 11/18/2015 1117   K 3.8 04/17/2015 0757   CL 106 04/17/2015 0757   CL 106 07/11/2012 0906   CO2 25  11/18/2015 1117   CO2 25 04/17/2015 0757   GLUCOSE 93 11/18/2015 1117   GLUCOSE 97 04/17/2015 0757   GLUCOSE 107* 07/11/2012 0906   BUN 16.5 11/18/2015 1117   BUN 16 04/17/2015 0757   CREATININE 0.7 11/18/2015 1117   CREATININE 0.62 04/17/2015 0757   CALCIUM 10.3 11/18/2015 1117   CALCIUM 10.4* 04/17/2015 0757   PROT 7.0 11/18/2015 1117   PROT 7.1 06/09/2013 1156   ALBUMIN 4.2 11/18/2015 1117   ALBUMIN 3.7 06/09/2013 1156   AST 15 11/18/2015 1117   AST 13 06/09/2013 1156   ALT 18 11/18/2015 1117   ALT 16 06/09/2013 1156   ALKPHOS 48 11/18/2015 1117   ALKPHOS 59 06/09/2013 1156   BILITOT 0.99 11/18/2015 1117   BILITOT 0.5 06/09/2013 1156   GFRNONAA >60 04/17/2015 0757   GFRAA >60 04/17/2015 0757    No results found for: SPEP, UPEP  Lab Results  Component Value Date   WBC 5.0 11/18/2015   NEUTROABS 3.2 11/18/2015   HGB 12.1 11/18/2015   HCT 36.9 11/18/2015   MCV 92.0 11/18/2015   PLT 130* 11/18/2015      Chemistry  Component Value Date/Time   NA 139 11/18/2015 1117   NA 139 04/17/2015 0757   K 4.1 11/18/2015 1117   K 3.8 04/17/2015 0757   CL 106 04/17/2015 0757   CL 106 07/11/2012 0906   CO2 25 11/18/2015 1117   CO2 25 04/17/2015 0757   BUN 16.5 11/18/2015 1117   BUN 16 04/17/2015 0757   CREATININE 0.7 11/18/2015 1117   CREATININE 0.62 04/17/2015 0757      Component Value Date/Time   CALCIUM 10.3 11/18/2015 1117   CALCIUM 10.4* 04/17/2015 0757   ALKPHOS 48 11/18/2015 1117   ALKPHOS 59 06/09/2013 1156   AST 15 11/18/2015 1117   AST 13 06/09/2013 1156   ALT 18 11/18/2015 1117   ALT 16 06/09/2013 1156   BILITOT 0.99 11/18/2015 1117   BILITOT 0.5 06/09/2013 1156       RADIOGRAPHIC STUDIES:I reviewed the recent CT scan with her I have personally reviewed the radiological images as listed and agreed with the findings in the report.    ASSESSMENT & PLAN:   History of breast cancer in female Clinically, she have no signs of recurrence. She will  continue screening mammogram      History of non-Hodgkin's lymphoma Clinically, she have no signs of lymphoma recurrence.  I reviewed the CT scan with the patient I will continue history, physical examination blood work and yearly examination.      Chronic ITP (idiopathic thrombocytopenia) (HCC) The cause is likely ITP. It is mild and there is little change compared from previous platelet count. The patient denies recent history of bleeding such as epistaxis, hematuria or hematochezia. She is asymptomatic from the thrombocytopenia. I will observe for now  Weight loss She has profound weight loss due to aggressive dietary restriction and special diet. The patient appears malnourished and I recommend she stop her dietary restriction  Chronic constipation She has profound constipation with moderate stool burden on recent CT. I recommend aggressive laxative therapy Her abdominal discomfort is likely due to constipation.  In addition, she also has a small hernia on palpation but that could just be observed     All questions were answered. The patient knows to call the clinic with any problems, questions or concerns. No barriers to learning was detected. I spent 20 minutes counseling the patient face to face. The total time spent in the appointment was 25 minutes and more than 50% was on counseling and review of test results     [facility], [place], MD 11/18/2015 3:41 PM   .

## 2015-11-19 ENCOUNTER — Telehealth: Payer: Self-pay | Admitting: *Deleted

## 2015-11-19 DIAGNOSIS — K5909 Other constipation: Secondary | ICD-10-CM | POA: Insufficient documentation

## 2015-11-19 DIAGNOSIS — R634 Abnormal weight loss: Secondary | ICD-10-CM | POA: Insufficient documentation

## 2015-11-19 LAB — VITAMIN D 25 HYDROXY (VIT D DEFICIENCY, FRACTURES): Vitamin D, 25-Hydroxy: 67 ng/mL (ref 30.0–100.0)

## 2015-11-19 LAB — LIPID PANEL
CHOL/HDL RATIO: 2 ratio (ref 0.0–4.4)
Cholesterol, Total: 173 mg/dL (ref 100–199)
HDL: 86 mg/dL (ref >39)
LDL CALC: 79 mg/dL (ref 0–99)
Triglycerides: 42 mg/dL (ref 0–149)
VLDL Cholesterol Cal: 8 mg/dL (ref 5–40)

## 2015-11-19 NOTE — Telephone Encounter (Signed)
-----   Message from Heath Lark, MD sent at 11/19/2015  7:44 AM EDT ----- Regarding: test results to PCP PLs make sure PCP has test results ----- Message -----    From: [facility]    Sent: 11/18/2015  11:41 AM      To: Heath Lark, MD

## 2015-11-19 NOTE — Assessment & Plan Note (Signed)
She has profound constipation with moderate stool burden on recent CT. I recommend aggressive laxative therapy Her abdominal discomfort is likely due to constipation.  In addition, she also has a small hernia on palpation but that could just be observed

## 2015-11-19 NOTE — Telephone Encounter (Signed)
Labs faxed to [facility]

## 2015-11-19 NOTE — Assessment & Plan Note (Signed)
Clinically, she have no signs of recurrence. She will continue screening mammogram   

## 2015-11-19 NOTE — Assessment & Plan Note (Signed)
She has profound weight loss due to aggressive dietary restriction and special diet. The patient appears malnourished and I recommend she stop her dietary restriction

## 2015-11-19 NOTE — Assessment & Plan Note (Signed)
The cause is likely ITP. It is mild and there is little change compared from previous platelet count. The patient denies recent history of bleeding such as epistaxis, hematuria or hematochezia. She is asymptomatic from the thrombocytopenia. I will observe for now

## 2015-11-19 NOTE — Assessment & Plan Note (Signed)
Clinically, she have no signs of lymphoma recurrence.  I reviewed the CT scan with the patient I will continue history, physical examination blood work and yearly examination.

## 2015-11-21 DIAGNOSIS — Z23 Encounter for immunization: Secondary | ICD-10-CM | POA: Diagnosis not present

## 2015-11-21 DIAGNOSIS — Z0000 Encounter for general adult medical examination without abnormal findings: Secondary | ICD-10-CM | POA: Diagnosis not present

## 2015-11-21 DIAGNOSIS — E785 Hyperlipidemia, unspecified: Secondary | ICD-10-CM | POA: Diagnosis not present

## 2015-11-21 DIAGNOSIS — Z1389 Encounter for screening for other disorder: Secondary | ICD-10-CM | POA: Diagnosis not present

## 2015-11-21 DIAGNOSIS — M810 Age-related osteoporosis without current pathological fracture: Secondary | ICD-10-CM | POA: Diagnosis not present

## 2015-11-22 ENCOUNTER — Telehealth: Payer: Self-pay | Admitting: Internal Medicine

## 2015-11-22 DIAGNOSIS — I700 Atherosclerosis of aorta: Secondary | ICD-10-CM | POA: Diagnosis not present

## 2015-11-22 DIAGNOSIS — F339 Major depressive disorder, recurrent, unspecified: Secondary | ICD-10-CM | POA: Diagnosis not present

## 2015-11-22 DIAGNOSIS — D696 Thrombocytopenia, unspecified: Secondary | ICD-10-CM | POA: Diagnosis not present

## 2015-11-22 DIAGNOSIS — E785 Hyperlipidemia, unspecified: Secondary | ICD-10-CM | POA: Diagnosis not present

## 2015-11-22 DIAGNOSIS — J449 Chronic obstructive pulmonary disease, unspecified: Secondary | ICD-10-CM | POA: Diagnosis not present

## 2015-11-22 NOTE — Telephone Encounter (Signed)
CT scan shows small bowel inflammation and patient with a history of lymphoma.  She will come in and see Ellouise Newer, PA on 11/26/15 2:30 Patient aware

## 2015-11-26 ENCOUNTER — Encounter: Payer: Self-pay | Admitting: Physician Assistant

## 2015-11-26 ENCOUNTER — Ambulatory Visit (INDEPENDENT_AMBULATORY_CARE_PROVIDER_SITE_OTHER): Payer: Medicare Other | Admitting: Physician Assistant

## 2015-11-26 VITALS — BP 118/68 | HR 66 | Ht 61.0 in | Wt 130.8 lb

## 2015-11-26 DIAGNOSIS — K5900 Constipation, unspecified: Secondary | ICD-10-CM | POA: Diagnosis not present

## 2015-11-26 DIAGNOSIS — K219 Gastro-esophageal reflux disease without esophagitis: Secondary | ICD-10-CM | POA: Diagnosis not present

## 2015-11-26 DIAGNOSIS — R935 Abnormal findings on diagnostic imaging of other abdominal regions, including retroperitoneum: Secondary | ICD-10-CM

## 2015-11-26 NOTE — Progress Notes (Signed)
Chief Complaint: Constipation, Abnormal CT Abdomen  HPI: Mrs. Pomroy is a 81 y/o Caucasian female who was referred to me by Thressa Sheller, MD for a complaint of constipation and abnormal CT of Abdomen . She typically follows with Dr. Carlean Purl. CT of the abdomen and pelvis completed on 11/14/15 for a finding of bilateral inguinal adenopathy, right greater than left for a month, history of prior right mastectomy for breast carcinoma and history of lymphoma diagnosed in 1999 showed no evidence of inguinal adenopathy. Small lymph nodes were present none of which were pathologically enlarged. There were a few segments of small bowel which appeared to have thickened mucosa within the mid lower abdomen worrisome for lymphomatous involvement. No bowel obstruction was seen. Degenerative change of the pubic symphysis, S1 joints, as well as degenerative disc disease at L4-5 and L5-S1 where anteriolisthesis are present. According to patient's notes, she recently saw Dr. Alvy Bimler with oncology on 11/19/15, who recommended aggressive laxative therapy due to constipation and moderate stool burden on recent CT and who reviewed the patient's recent CT with her and was apparently underwhelmed according to his notes.  Independent review of patient's recent colonoscopy report and images from 01/09/14 show a normal colonoscopy with excellent prep. It was not recommended that she have any further routine screenings.  Today, the patient tells me that for the past few months she has been on a "med fast" diet and has lost a remarkable amount of weight, she has been doing this to be supportive of her daughter who also started the same diet. She explains that over this time she has been constipated noting that she may not have a bowel movement for 4 days. This is very abnormal for her as she typically had one bowel movement per day. She recently went to see her oncologist who was very concerned about her extreme weight loss as well  as her constipation. She explains that for the past week or so since seeing her oncologist she has been using 3 times a day Colace and Senokot once daily which has resulted in bowel movements on an almost daily basis, but they still require "a lot of straining". Associated symptoms do include some generalized abdominal cramping and bloating when constipated, relieved after bowel movements. The patient denies any blood in her stool.  The patient is also very concerned today regarding the finding of "small bowel thickening" on recent CT. She was told by her primary care physician that they could "not rule out cancer". She has seen her oncologist since then with notes as above, she does not recall discussing the CT at time of that office visit. She is very worried that this could be "my lymphoma again".  Patient's reflux is well controlled on Nexium once daily.  Patient denies fever, chills, melena, fatigue, anorexia, nausea, vomiting, heartburn or reflux.    Past Medical History:  Diagnosis Date  . Anxiety   . Breast cancer, stage 1, estrogen receptor negative ([place]) 07/06/2011  . Cancer of breast ([place])   . Cataract   . Chronic ITP (idiopathic thrombocytopenia) (HCC) 07/06/2011  . Constipation    takes Colace daily  . COPD (chronic obstructive pulmonary disease) (HCC)    mild  . Depression    "nervous break down age 53"  . GERD (gastroesophageal reflux disease)    takes Nexium daily  . History of colon polyps   . History of depression    received shock treatments   . History of palpitations  takes Metoprolol daily  . History of vertigo   . HOH (hard of hearing)    has hearing aids  . Hyperlipidemia    takes Lovastatin daily  . Hypothyroid 07/06/2011   takes SYnthroid daily  . Joint pain   . Joint swelling   . Lymphoma ([place])   . Lymphoma ([place])    B cell   . Lymphoma of lymph nodes of head, face, and/or neck ([place]) 07/06/2011  . Neuropathy ([place])   . Nocturia   . Osteopenia   .  Osteoporosis    hx of and take Reclast  . Peripheral neuropathy ([place])   . Right knee DJD   . Shortness of breath dyspnea    with exertion  . Thyroid disease   . Umbilical hernia     Past Surgical History:  Procedure Laterality Date  . ADENOIDECTOMY     as a child  . BREAST SURGERY Right   . BUNIONECTOMY Right   . CARDIAC CATHETERIZATION  2014  . COLONOSCOPY    . FOOT SURGERY Right   . INSERTION OF MESH N/A 04/17/2015   Procedure: INSERTION OF MESH;  Surgeon: Ralene Ok, MD;  Location: [place];  Service: General;  Laterality: N/A;  . KNEE SURGERY    . LEFT HEART CATHETERIZATION WITH CORONARY ANGIOGRAM N/A 12/05/2012   Procedure: LEFT HEART CATHETERIZATION WITH CORONARY ANGIOGRAM;  Surgeon: [name] Sine, MD;  Location: [facility] CATH LAB;  Service: Cardiovascular;  Laterality: N/A;  . right  breast surgery     d/t breast cancer  . TONSILLECTOMY     as a child  . TOTAL KNEE ARTHROPLASTY Right 06/19/2013   Procedure: TOTAL KNEE ARTHROPLASTY;  Surgeon: Lorn Junes, MD;  Location: [place];  Service: Orthopedics;  Laterality: Right;  . TURBINATE REDUCTION    . UMBILICAL HERNIA REPAIR N/A 04/17/2015   Procedure: LAPAROSCOPIC UMBILICAL HERNIA REPAIR WITH MESH;  Surgeon: Ralene Ok, MD;  Location: [place];  Service: General;  Laterality: N/A;  . UPPER GASTROINTESTINAL ENDOSCOPY      Current Outpatient Prescriptions  Medication Sig Dispense Refill  . acetaminophen (TYLENOL) 500 MG tablet Take 1,000 mg by mouth 2 (two) times daily.    Marland Kitchen amoxicillin (AMOXIL) 500 MG capsule Take 500 mg by mouth. Reported on 11/18/2015    . aspirin 81 MG tablet Take 81 mg by mouth daily.    . cholecalciferol (VITAMIN D) 1000 UNITS tablet Take 1,000 Units by mouth daily.    . citalopram (CELEXA) 40 MG tablet Take 40 mg by mouth daily.    . Cyanocobalamin (VITAMIN B-12 PO) Take 1,000 mcg by mouth daily.    Marland Kitchen esomeprazole (NEXIUM) 40 MG capsule Take 40 mg by mouth 2 (two) times daily before a meal.     .  famciclovir (FAMVIR) 125 MG tablet Take 250 mg by mouth 2 (two) times daily as needed. For 5 days as needed for outbreak    . L-Methylfolate-Algae-B12-B6 (METANX) 3-90.314-2-35 MG CAPS Take 1 tablet by mouth 2 (two) times daily.    Marland Kitchen levothyroxine (SYNTHROID, LEVOTHROID) 75 MCG tablet Take 75 mcg by mouth daily.      Marland Kitchen lovastatin (MEVACOR) 20 MG tablet Take 20 mg by mouth at bedtime.      Marland Kitchen LYRICA 50 MG capsule Take 50 mg by mouth 2 (two) times daily.     . metoprolol succinate (TOPROL-XL) 25 MG 24 hr tablet Take 25 mg by mouth daily.     . Multiple Vitamins-Minerals (MULTIVITAMINS  THER. W/MINERALS) TABS Take 1 tablet by mouth daily.      Vladimir Faster Glycol-Propyl Glycol (SYSTANE) 0.4-0.3 % SOLN Apply 2 drops to eye 2 (two) times daily as needed (for dry eyes).    . Vitamin D, Ergocalciferol, (DRISDOL) 50000 UNITS CAPS Take 50,000 Units by mouth every 14 (fourteen) days.       No current facility-administered medications for this visit.     Allergies as of 11/26/2015 - Review Complete 11/18/2015  Allergen Reaction Noted  . Oxycodone Other (See Comments) 07/11/2013  . Codeine Itching 04/13/2011  . Doxycycline Swelling and Other (See Comments) 04/13/2011  . Meperidine and related Itching and Rash 11/25/2011  . Sulfa antibiotics Itching 04/13/2011    Family History  Problem Relation Age of Onset  . Hyperlipidemia Father   . Heart disease Father   . Heart disease Sister   . Cancer Brother   . Diabetes Son   . Heart disease Maternal Grandfather   . Colon cancer Neg Hx     Social History   Social History  . Marital status: Widowed    Spouse name: N/A  . Number of children: 2  . Years of education: HS   Occupational History  . Retired    Social History Main Topics  . Smoking status: Former Smoker    Packs/day: 0.30    Types: Cigarettes  . Smokeless tobacco: Never Used     Comment: quit smoking 1985  . Alcohol use No  . Drug use: No  . Sexual activity: Yes    Birth  control/ protection: Post-menopausal   Other Topics Concern  . Not on file   Social History Narrative   Lives at home alone.   Right-handed.   4 cups of caffeine daily.    Review of Systems:    Constitutional: Positive for weight loss No fever, chills, weakness or fatigue HEENT: Eyes: No change in vision               Ears, Nose, Throat:  No change in hearing Skin: No rash or itching Cardiovascular: No chest pain, chest pressure or palpitations      Respiratory: No SOB or cough Gastrointestinal: See HPI and otherwise negative Genitourinary: No dysuria or change in urinary frequency Neurological: No headache or dizziness Musculoskeletal: No new muscle or back pain Hematologic: No bleeding Psychiatric: No history of depression or anxiety   Physical Exam:  Vital signs: :BP 118/68 (BP Location: Left Arm, Patient Position: Sitting)   Pulse 66   Ht 5\' 1"  (1.549 m)   Wt 130 lb 12.8 oz (59.3 kg)   SpO2 98%   BMI 24.71 kg/m  General:   Thin appearing Pleasant Caucasian female appears to be in NAD, Well developed, Well nourished, alert and cooperative Head:  Normocephalic and atraumatic. Eyes:   PEERL, EOMI. No icterus. Conjunctiva pink. Ears:  Normal auditory acuity. Neck:  Supple Throat: Oral cavity and pharynx without inflammation, swelling or lesion.  Lungs: Respirations even and unlabored. Lungs clear to auscultation bilaterally.   No wheezes, crackles, or rhonchi.  Heart: Normal S1, S2. No MRG. Regular rate and rhythm. No peripheral edema, cyanosis or pallor.  Abdomen:  Soft, nondistended, nontender. No rebound or guarding. Normal bowel sounds. No appreciable masses or hepatomegaly. Rectal:  Not performed.  Msk:  Symmetrical without gross deformities. Peripheral pulses intact.  Extremities:  Without edema, no deformity or joint abnormality. Neurologic:  Alert and  oriented x4;  grossly normal neurologically. Skin:   Dry  and intact without significant lesions or  rashes. Psychiatric: Oriented to person, place and time. Demonstrates good judgement and reason without abnormal affect or behaviors.  RELEVANT LABS AND IMAGING: CBC    Component Value Date/Time   WBC 5.0 11/18/2015 1119   WBC 4.4 04/17/2015 0757   RBC 4.01 11/18/2015 1119   RBC 4.12 04/17/2015 0757   HGB 12.1 11/18/2015 1119   HCT 36.9 11/18/2015 1119   PLT 130 (L) 11/18/2015 1119   MCV 92.0 11/18/2015 1119   MCH 30.2 11/18/2015 1119   MCH 28.9 04/17/2015 0757   MCHC 32.8 11/18/2015 1119   MCHC 32.3 04/17/2015 0757   RDW 13.4 11/18/2015 1119   LYMPHSABS 1.3 11/18/2015 1119   MONOABS 0.3 11/18/2015 1119   EOSABS 0.1 11/18/2015 1119   BASOSABS 0.0 11/18/2015 1119    CMP     Component Value Date/Time   NA 139 11/18/2015 1117   K 4.1 11/18/2015 1117   CL 106 04/17/2015 0757   CL 106 07/11/2012 0906   CO2 25 11/18/2015 1117   GLUCOSE 93 11/18/2015 1117   GLUCOSE 107 (H) 07/11/2012 0906   BUN 16.5 11/18/2015 1117   CREATININE 0.7 11/18/2015 1117   CALCIUM 10.3 11/18/2015 1117   PROT 7.0 11/18/2015 1117   ALBUMIN 4.2 11/18/2015 1117   AST 15 11/18/2015 1117   ALT 18 11/18/2015 1117   ALKPHOS 48 11/18/2015 1117   BILITOT 0.99 11/18/2015 1117   GFRNONAA >60 04/17/2015 0757   GFRAA >60 04/17/2015 0757    Assessment: 1. Constipation: Patient reports constipation ever since changing her diet, some results with Senokot once daily and Colace 3 times daily; likely effect of recent diet plan 2. Abnormal CT of the abdomen and pelvis: See history of present illness for further details, thickened small bowel loops, did discuss this with Dr. Ardis Hughs as well as Dr. Carlean Purl at time of patient's appointment, there are no signs of Crohn's, though cannot rule out lymphoma, it appears patient's oncologist was underwhelmed, we will ask that they follow with her regarding their concerns 3. GERD: Patient reports doing well on once daily Nexium.  Plan: 1. Continue Nexium 40 mg once daily,  30-60 minutes before eating for reflux 2. Recommend the patient increase fiber in her diet to at least 25-35 g per day. This may be done with the use of fiber supplements such a Citrucel, Metamucil or Benefiber and through her diet. The patient was given a handout regarding this. Recommended she drink at least 6-8 8 ounce glasses of water per day and exercise on a daily basis. 3. If above measures do not help, as well as going back to her regular diet, would recommend that she use MiraLAX. Did discuss that she can use this up to 4 times a day if necessary, titrated to 1 formed bowel movement per day. Recommend she discontinue Senokot at this time. She can use Colace if above measures are not helping. 4. Sent interoffice message to Dr. Alvy Bimler requesting that he or his office contact the patient regarding her recent abnormal CT and their thoughts about possible lymphoma. We will await their response before pursuing any further imaging or testing. Did discuss this with the patient. She is expecting a phone call. 5. Patient should return to our office as needed in the future.   Ellouise Newer, PA-C [facility] 11/26/2015, 2:20 PM  Cc: Thressa Sheller, MD

## 2015-11-26 NOTE — Patient Instructions (Signed)
We are giving you a high fiber diet. We have also given you samples of Miralax.

## 2015-11-27 ENCOUNTER — Telehealth: Payer: Self-pay | Admitting: *Deleted

## 2015-11-27 ENCOUNTER — Telehealth: Payer: Self-pay | Admitting: Hematology and Oncology

## 2015-11-27 NOTE — Telephone Encounter (Signed)
The GI service requested that I can talk to the patient to review the abnormal small bowel thickening noted on CT scan. I do not believe the patient has small bowel lymphoma. I do not know what is the cause of the abnormal thickening described in the recent CT scan. It is very unusual to have small bowel lymphoma as a cause of her symptom without associated lymphadenopathy I have attempted to call the patient's multiple times over the last 2 days and unable to leave phone messages on the phone. I have also attempted to call her daughter and left several messages and unable to get hold of her. I will get my nursing staff to call her again in a few days

## 2015-11-27 NOTE — Telephone Encounter (Signed)
Informed pt Dr. Alvy Bimler does not think her CT Scan showed evidence of Lymphoma. Dr. Alvy Bimler does not think the colon "thickening" is Lymphoma.   Pt verbalized understanding and relief.

## 2015-12-14 NOTE — Progress Notes (Signed)
Agree w/ Ms. Lemmon's management 

## 2015-12-28 ENCOUNTER — Encounter (HOSPITAL_COMMUNITY): Payer: Self-pay | Admitting: Emergency Medicine

## 2015-12-28 ENCOUNTER — Emergency Department (HOSPITAL_COMMUNITY): Payer: Medicare Other

## 2015-12-28 ENCOUNTER — Emergency Department (HOSPITAL_COMMUNITY)
Admission: EM | Admit: 2015-12-28 | Discharge: 2015-12-28 | Disposition: A | Discharge status: Home / Self Care | Payer: Medicare Other | Attending: Emergency Medicine | Admitting: Emergency Medicine

## 2015-12-28 DIAGNOSIS — Z791 Long term (current) use of non-steroidal anti-inflammatories (NSAID): Secondary | ICD-10-CM | POA: Insufficient documentation

## 2015-12-28 DIAGNOSIS — Y999 Unspecified external cause status: Secondary | ICD-10-CM | POA: Insufficient documentation

## 2015-12-28 DIAGNOSIS — Z87891 Personal history of nicotine dependence: Secondary | ICD-10-CM | POA: Diagnosis not present

## 2015-12-28 DIAGNOSIS — Z8572 Personal history of non-Hodgkin lymphomas: Secondary | ICD-10-CM | POA: Insufficient documentation

## 2015-12-28 DIAGNOSIS — E039 Hypothyroidism, unspecified: Secondary | ICD-10-CM | POA: Diagnosis not present

## 2015-12-28 DIAGNOSIS — Y929 Unspecified place or not applicable: Secondary | ICD-10-CM | POA: Diagnosis not present

## 2015-12-28 DIAGNOSIS — S52591A Other fractures of lower end of right radius, initial encounter for closed fracture: Secondary | ICD-10-CM | POA: Diagnosis not present

## 2015-12-28 DIAGNOSIS — W1789XA Other fall from one level to another, initial encounter: Secondary | ICD-10-CM | POA: Diagnosis not present

## 2015-12-28 DIAGNOSIS — S62101A Fracture of unspecified carpal bone, right wrist, initial encounter for closed fracture: Secondary | ICD-10-CM | POA: Diagnosis not present

## 2015-12-28 DIAGNOSIS — S6991XA Unspecified injury of right wrist, hand and finger(s), initial encounter: Secondary | ICD-10-CM | POA: Diagnosis present

## 2015-12-28 DIAGNOSIS — S52501A Unspecified fracture of the lower end of right radius, initial encounter for closed fracture: Secondary | ICD-10-CM | POA: Diagnosis not present

## 2015-12-28 DIAGNOSIS — Y9389 Activity, other specified: Secondary | ICD-10-CM | POA: Diagnosis not present

## 2015-12-28 DIAGNOSIS — Z7982 Long term (current) use of aspirin: Secondary | ICD-10-CM | POA: Insufficient documentation

## 2015-12-28 DIAGNOSIS — Z853 Personal history of malignant neoplasm of breast: Secondary | ICD-10-CM | POA: Diagnosis not present

## 2015-12-28 DIAGNOSIS — J449 Chronic obstructive pulmonary disease, unspecified: Secondary | ICD-10-CM | POA: Diagnosis not present

## 2015-12-28 NOTE — ED Triage Notes (Signed)
Pt fell while getting down off of her shower chair while wiping her mirror down. Pt fell on her R side. Pt has swelling and deformity to R wrist. Pt c/o pain up into her R arm. A&Ox4 and ambulatory. Denies head injury. No other complaints.

## 2015-12-28 NOTE — ED Notes (Signed)
MD at bedside. 

## 2015-12-28 NOTE — ED Provider Notes (Signed)
[facility] DEPT Provider Note   CSN: [id number] Arrival date & time: 12/28/15  1310     History   Chief Complaint Chief Complaint  Patient presents with  . Fall  . Wrist Injury    HPI Darlene [name] is a 81 y.o. female.  The history is provided by the patient.  patient presents after a fall. She fell off a stool and onto her left wrist. No LOC. Some pain in her right elbow and swelling and pain in her right wrist. No shoulder pain. No loss conscious. She is not on anticoagulation except for an aspirin. No headache. No confusion. Skin is intact.  Past Medical History:  Diagnosis Date  . Anxiety   . Breast cancer, stage 1, estrogen receptor negative ([place]) 07/06/2011  . Cancer of breast ([place])   . Cataract   . Chronic ITP (idiopathic thrombocytopenia) (HCC) 07/06/2011  . Constipation    takes Colace daily  . COPD (chronic obstructive pulmonary disease) (HCC)    mild  . Depression    "nervous break down age 57"  . GERD (gastroesophageal reflux disease)    takes Nexium daily  . History of colon polyps   . History of depression    received shock treatments   . History of palpitations    takes Metoprolol daily  . History of vertigo   . HOH (hard of hearing)    has hearing aids  . Hyperlipidemia    takes Lovastatin daily  . Hypothyroid 07/06/2011   takes SYnthroid daily  . Joint pain   . Joint swelling   . Lymphoma ([place])   . Lymphoma ([place])    B cell   . Lymphoma of lymph nodes of head, face, and/or neck ([place]) 07/06/2011  . Neuropathy ([place])   . Nocturia   . Osteopenia   . Osteoporosis    hx of and take Reclast  . Peripheral neuropathy ([place])   . Right knee DJD   . Shortness of breath dyspnea    with exertion  . Thyroid disease   . Umbilical hernia     Patient Active Problem List   Diagnosis Date Noted  . Weight loss 11/19/2015  . Chronic constipation 11/19/2015  . Other specified hypothyroidism 11/14/2015  . DJD (degenerative joint disease) of knee  06/19/2013  . Right knee DJD   . Hyperlipidemia   . Thyroid disease   . Chest pain 12/26/2012  . Unstable angina ([place]) 12/03/2012  . Osteoporosis 12/03/2012  . Palpitations 12/03/2012  . Dyslipidemia 12/03/2012  . History of breast cancer in female 07/06/2011  . History of non-Hodgkin's lymphoma 07/06/2011  . Hypothyroid 07/06/2011  . Chronic ITP (idiopathic thrombocytopenia) (HCC) 07/06/2011    Past Surgical History:  Procedure Laterality Date  . ADENOIDECTOMY     as a child  . BREAST SURGERY Right   . BUNIONECTOMY Right   . CARDIAC CATHETERIZATION  2014  . COLONOSCOPY    . FOOT SURGERY Right   . INSERTION OF MESH N/A 04/17/2015   Procedure: INSERTION OF MESH;  Surgeon: Ralene Ok, MD;  Location: [facility];  Service: General;  Laterality: N/A;  . KNEE SURGERY    . LEFT HEART CATHETERIZATION WITH CORONARY ANGIOGRAM N/A 12/05/2012   Procedure: LEFT HEART CATHETERIZATION WITH CORONARY ANGIOGRAM;  Surgeon: [name] Sine, MD;  Location: [facility] CATH LAB;  Service: Cardiovascular;  Laterality: N/A;  . right  breast surgery     d/t breast cancer  . TONSILLECTOMY     as  a child  . TOTAL KNEE ARTHROPLASTY Right 06/19/2013   Procedure: TOTAL KNEE ARTHROPLASTY;  Surgeon: Lorn Junes, MD;  Location: [facility];  Service: Orthopedics;  Laterality: Right;  . TURBINATE REDUCTION    . UMBILICAL HERNIA REPAIR N/A 04/17/2015   Procedure: LAPAROSCOPIC UMBILICAL HERNIA REPAIR WITH MESH;  Surgeon: Ralene Ok, MD;  Location: [facility];  Service: General;  Laterality: N/A;  . UPPER GASTROINTESTINAL ENDOSCOPY      OB History    No data available       Home Medications    Prior to Admission medications   Medication Sig Start Date End Date Taking? Authorizing Provider  acetaminophen (TYLENOL) 500 MG tablet Take 1,000 mg by mouth 2 (two) times daily.    Historical Provider, MD  amoxicillin (AMOXIL) 500 MG capsule Take 500 mg by mouth. Reported on 11/18/2015    Historical Provider, MD  aspirin 81  MG tablet Take 81 mg by mouth daily.    Historical Provider, MD  cholecalciferol (VITAMIN D) 1000 UNITS tablet Take 1,000 Units by mouth daily.    Historical Provider, MD  cholecalciferol (VITAMIN D) 1000 units tablet Take 1,000 Units by mouth daily.    Historical Provider, MD  citalopram (CELEXA) 40 MG tablet Take 40 mg by mouth daily.    Historical Provider, MD  Cyanocobalamin (VITAMIN B-12 PO) Take 1,000 mcg by mouth daily.    Historical Provider, MD  esomeprazole (NEXIUM) 40 MG capsule Take 40 mg by mouth 2 (two) times daily before a meal.     Historical Provider, MD  famciclovir (FAMVIR) 125 MG tablet Take 250 mg by mouth 2 (two) times daily as needed. For 5 days as needed for outbreak    Historical Provider, MD  L-Methylfolate-Algae-B12-B6 Glade Stanford) 3-90.314-2-35 MG CAPS Take 1 tablet by mouth 2 (two) times daily. 03/20/15   Historical Provider, MD  levothyroxine (SYNTHROID, LEVOTHROID) 75 MCG tablet Take 75 mcg by mouth daily.      Historical Provider, MD  lovastatin (MEVACOR) 20 MG tablet Take 20 mg by mouth at bedtime.      Historical Provider, MD  LYRICA 50 MG capsule Take 50 mg by mouth 2 (two) times daily.  12/25/14   Historical Provider, MD  metoprolol succinate (TOPROL-XL) 25 MG 24 hr tablet Take 25 mg by mouth daily.  11/22/12   Historical Provider, MD  Multiple Vitamins-Minerals (MULTIVITAMINS THER. W/MINERALS) TABS Take 1 tablet by mouth daily.      Historical Provider, MD  Polyethyl Glycol-Propyl Glycol (SYSTANE) 0.4-0.3 % SOLN Apply 2 drops to eye 2 (two) times daily as needed (for dry eyes).    Historical Provider, MD  Vitamin D, Ergocalciferol, (DRISDOL) 50000 UNITS CAPS Take 50,000 Units by mouth every 14 (fourteen) days.      Historical Provider, MD    Family History Family History  Problem Relation Age of Onset  . Hyperlipidemia Father   . Heart disease Father   . Heart disease Sister   . Cancer Brother   . Diabetes Son   . Heart disease Maternal Grandfather   . Colon  cancer Neg Hx     Social History Social History  Substance Use Topics  . Smoking status: Former Smoker    Packs/day: 0.30    Types: Cigarettes  . Smokeless tobacco: Never Used     Comment: quit smoking 1985  . Alcohol use No     Allergies   Oxycodone; Codeine; Doxycycline; Meperidine and related; and Sulfa antibiotics   Review of  Systems Review of Systems  Constitutional: Negative for appetite change.  Respiratory: Negative for shortness of breath.   Cardiovascular: Negative for chest pain.  Gastrointestinal: Negative for abdominal pain.  Genitourinary: Negative for flank pain.  Musculoskeletal:       Right wrist pain and swelling  Neurological: Negative for weakness and numbness.     Physical Exam Updated Vital Signs BP 134/68 (BP Location: Left Arm)   Pulse 64   Temp 97.9 F (36.6 C) (Oral)   Resp 16   SpO2 97%   Physical Exam  Constitutional: She appears well-developed.  HENT:  Head: Atraumatic.  Eyes: EOM are normal.  Neck: Neck supple.  Cardiovascular: Normal rate.   Pulmonary/Chest: Effort normal.  Abdominal: There is no tenderness.  Musculoskeletal:  Mild pain at right elbow. Good range of motion. Slight pain with range of motion. No pain over right shoulder. Pain and deformity to right wrist particularly on the radial aspect. Skin intact. Good capillary refill. Radial pulse intact.  Neurological: She is alert.  Skin: Skin is warm. Capillary refill takes less than 2 seconds.     ED Treatments / Results  Labs (all labs ordered are listed, but only abnormal results are displayed) Labs Reviewed - No data to display  EKG  EKG Interpretation None       Radiology Dg Elbow Complete Right  Result Date: 12/28/2015 CLINICAL DATA:  Fall onto right arm. Pt fell while getting out of her shower chair while wiping her mirror down. Pt fell on her R side. Known right wrist fracture due to fall today. H/o osteoporosis and osteopenia are EXAM: RIGHT ELBOW -  COMPLETE 3+ VIEW COMPARISON:  None. FINDINGS: No evidence of fracture of the ulna or humerus. The radial head is normal. No joint effusion. IMPRESSION: No fracture or dislocation. Electronically Signed   By: Suzy Bouchard M.D.   On: 12/28/2015 14:39   Dg Wrist Complete Right  Result Date: 12/28/2015 CLINICAL DATA:  Fall. EXAM: RIGHT WRIST - COMPLETE 3+ VIEW COMPARISON:  None FINDINGS: Acute impacted fracture deformity involving the distal radius is identified. There is mild dorsal angulation of the distal fracture fragments. Advanced degenerative changes are noted at the basilar joint. IMPRESSION: 1. Acute impacted fracture involves the distal radius. Electronically Signed   By: Kerby Moors M.D.   On: 12/28/2015 13:49    Procedures Procedures (including critical care time)  Medications Ordered in ED Medications - No data to display   Initial Impression / Assessment and Plan / ED Course  I have reviewed the triage vital signs and the nursing notes.  Pertinent labs & imaging results that were available during my care of the patient were reviewed by me and considered in my medical decision making (see chart for details).  Clinical Course    Patient with fall. Distal radius fracture. Skin closed. Will mobilize and have follow-up with hand surgery.  Final Clinical Impressions(s) / ED Diagnoses   Final diagnoses:  Distal radius fracture, right, closed, initial encounter    New Prescriptions New Prescriptions   No medications on file     Davonna Belling, MD 12/28/15 1600

## 2015-12-28 NOTE — ED Notes (Signed)
RADIOLOGY ASSISTING WITH ATTEMPTS TO REMOVE RING FROM RING FINGER

## 2015-12-28 NOTE — ED Notes (Signed)
MD at bedside. EDP PICKERING 

## 2015-12-28 NOTE — ED Notes (Signed)
Dillon Bjork RN assisting with attempt to remove ring from ring finger. EDp Pickering aware of jewelry.

## 2015-12-30 DIAGNOSIS — S52571A Other intraarticular fracture of lower end of right radius, initial encounter for closed fracture: Secondary | ICD-10-CM | POA: Diagnosis not present

## 2015-12-30 DIAGNOSIS — S52501D Unspecified fracture of the lower end of right radius, subsequent encounter for closed fracture with routine healing: Secondary | ICD-10-CM | POA: Insufficient documentation

## 2015-12-31 ENCOUNTER — Other Ambulatory Visit: Payer: Self-pay

## 2016-01-03 DIAGNOSIS — S52571A Other intraarticular fracture of lower end of right radius, initial encounter for closed fracture: Secondary | ICD-10-CM | POA: Diagnosis not present

## 2016-01-03 DIAGNOSIS — G8918 Other acute postprocedural pain: Secondary | ICD-10-CM | POA: Diagnosis not present

## 2016-01-03 DIAGNOSIS — Y999 Unspecified external cause status: Secondary | ICD-10-CM | POA: Diagnosis not present

## 2016-01-03 DIAGNOSIS — G5601 Carpal tunnel syndrome, right upper limb: Secondary | ICD-10-CM | POA: Diagnosis not present

## 2016-01-07 DIAGNOSIS — M25531 Pain in right wrist: Secondary | ICD-10-CM | POA: Diagnosis not present

## 2016-01-07 DIAGNOSIS — M25631 Stiffness of right wrist, not elsewhere classified: Secondary | ICD-10-CM | POA: Diagnosis not present

## 2016-01-07 DIAGNOSIS — S52571A Other intraarticular fracture of lower end of right radius, initial encounter for closed fracture: Secondary | ICD-10-CM | POA: Diagnosis not present

## 2016-01-12 DIAGNOSIS — M25531 Pain in right wrist: Secondary | ICD-10-CM | POA: Insufficient documentation

## 2016-01-12 DIAGNOSIS — M25631 Stiffness of right wrist, not elsewhere classified: Secondary | ICD-10-CM | POA: Insufficient documentation

## 2016-01-27 ENCOUNTER — Ambulatory Visit: Payer: Medicare Other | Admitting: Internal Medicine

## 2016-01-28 DIAGNOSIS — S52571D Other intraarticular fracture of lower end of right radius, subsequent encounter for closed fracture with routine healing: Secondary | ICD-10-CM | POA: Diagnosis not present

## 2016-02-13 ENCOUNTER — Encounter: Payer: Self-pay | Admitting: Podiatry

## 2016-02-13 ENCOUNTER — Ambulatory Visit (INDEPENDENT_AMBULATORY_CARE_PROVIDER_SITE_OTHER): Payer: Medicare Other | Admitting: Podiatry

## 2016-02-13 VITALS — Ht 61.0 in | Wt 130.0 lb

## 2016-02-13 DIAGNOSIS — M79676 Pain in unspecified toe(s): Secondary | ICD-10-CM

## 2016-02-13 DIAGNOSIS — B351 Tinea unguium: Secondary | ICD-10-CM | POA: Diagnosis not present

## 2016-02-13 NOTE — Progress Notes (Signed)
Patient ID: Darlene [name], female   DOB: Jun 04, 1934, 81 y.o.   MRN: [id number] Complaint:  Visit Type: Patient returns to my office for continued preventative foot care services. Complaint: Patient states" my nails have grown long and thick and become painful to walk and wear shoes".  He presents for preventative foot care services. No changes to ROS  Podiatric Exam: Vascular: dorsalis pedis and posterior tibial pulses are palpable bilateral. Capillary return is immediate. Temperature gradient is WNL. Skin turgor WNL  Sensorium: Normal Semmes Weinstein monofilament test. Normal tactile sensation bilaterally. Nail Exam: Pt has thick disfigured discolored nails with subungual debris noted bilateral entire nail hallux through fifth toenails Ulcer Exam: There is no evidence of ulcer or pre-ulcerative changes or infection. Orthopedic Exam: Muscle tone and strength are WNL. No limitations in general ROM. No crepitus or effusions noted. Foot type and digits show no abnormalities. Bony prominences are unremarkable. Skin: No Porokeratosis. No infection or ulcers  Diagnosis:  Tinea unguium, Pain in right toe, pain in left toes  Treatment & Plan Procedures and Treatment: Consent by patient was obtained for treatment procedures. The patient understood the discussion of treatment and procedures well. All questions were answered thoroughly reviewed. Debridement of mycotic and hypertrophic toenails, 1 through 5 bilateral and clearing of subungual debris. No ulceration, no infection noted.  Return Visit-Office Procedure: Patient instructed to return to the office for a follow up visit 3 months for continued evaluation and treatment.  Gardiner Barefoot DPM

## 2016-02-18 DIAGNOSIS — S52571D Other intraarticular fracture of lower end of right radius, subsequent encounter for closed fracture with routine healing: Secondary | ICD-10-CM | POA: Diagnosis not present

## 2016-03-02 DIAGNOSIS — J069 Acute upper respiratory infection, unspecified: Secondary | ICD-10-CM | POA: Diagnosis not present

## 2016-03-02 DIAGNOSIS — R05 Cough: Secondary | ICD-10-CM | POA: Diagnosis not present

## 2016-03-09 DIAGNOSIS — J40 Bronchitis, not specified as acute or chronic: Secondary | ICD-10-CM | POA: Diagnosis not present

## 2016-03-17 DIAGNOSIS — S52571A Other intraarticular fracture of lower end of right radius, initial encounter for closed fracture: Secondary | ICD-10-CM | POA: Diagnosis not present

## 2016-03-17 DIAGNOSIS — S52571D Other intraarticular fracture of lower end of right radius, subsequent encounter for closed fracture with routine healing: Secondary | ICD-10-CM | POA: Diagnosis not present

## 2016-04-09 DIAGNOSIS — S52571D Other intraarticular fracture of lower end of right radius, subsequent encounter for closed fracture with routine healing: Secondary | ICD-10-CM | POA: Diagnosis not present

## 2016-04-09 DIAGNOSIS — S52571A Other intraarticular fracture of lower end of right radius, initial encounter for closed fracture: Secondary | ICD-10-CM | POA: Diagnosis not present

## 2016-04-19 ENCOUNTER — Encounter (HOSPITAL_COMMUNITY): Payer: Self-pay

## 2016-04-19 ENCOUNTER — Emergency Department (HOSPITAL_COMMUNITY): Payer: Medicare Other

## 2016-04-19 ENCOUNTER — Emergency Department (HOSPITAL_COMMUNITY)
Admission: EM | Admit: 2016-04-19 | Discharge: 2016-04-19 | Disposition: A | Discharge status: Home / Self Care | Payer: Medicare Other | Attending: Emergency Medicine | Admitting: Emergency Medicine

## 2016-04-19 DIAGNOSIS — J449 Chronic obstructive pulmonary disease, unspecified: Secondary | ICD-10-CM | POA: Insufficient documentation

## 2016-04-19 DIAGNOSIS — Z79899 Other long term (current) drug therapy: Secondary | ICD-10-CM | POA: Insufficient documentation

## 2016-04-19 DIAGNOSIS — Y92481 Parking lot as the place of occurrence of the external cause: Secondary | ICD-10-CM | POA: Diagnosis not present

## 2016-04-19 DIAGNOSIS — Z7982 Long term (current) use of aspirin: Secondary | ICD-10-CM | POA: Insufficient documentation

## 2016-04-19 DIAGNOSIS — W010XXA Fall on same level from slipping, tripping and stumbling without subsequent striking against object, initial encounter: Secondary | ICD-10-CM | POA: Insufficient documentation

## 2016-04-19 DIAGNOSIS — E039 Hypothyroidism, unspecified: Secondary | ICD-10-CM | POA: Insufficient documentation

## 2016-04-19 DIAGNOSIS — W19XXXA Unspecified fall, initial encounter: Secondary | ICD-10-CM

## 2016-04-19 DIAGNOSIS — S8992XA Unspecified injury of left lower leg, initial encounter: Secondary | ICD-10-CM | POA: Diagnosis present

## 2016-04-19 DIAGNOSIS — Z853 Personal history of malignant neoplasm of breast: Secondary | ICD-10-CM | POA: Diagnosis not present

## 2016-04-19 DIAGNOSIS — S82045A Nondisplaced comminuted fracture of left patella, initial encounter for closed fracture: Secondary | ICD-10-CM | POA: Insufficient documentation

## 2016-04-19 DIAGNOSIS — Z96651 Presence of right artificial knee joint: Secondary | ICD-10-CM | POA: Insufficient documentation

## 2016-04-19 DIAGNOSIS — Y939 Activity, unspecified: Secondary | ICD-10-CM | POA: Diagnosis not present

## 2016-04-19 DIAGNOSIS — Z87891 Personal history of nicotine dependence: Secondary | ICD-10-CM | POA: Diagnosis not present

## 2016-04-19 DIAGNOSIS — S6991XA Unspecified injury of right wrist, hand and finger(s), initial encounter: Secondary | ICD-10-CM | POA: Diagnosis not present

## 2016-04-19 DIAGNOSIS — Y999 Unspecified external cause status: Secondary | ICD-10-CM | POA: Diagnosis not present

## 2016-04-19 DIAGNOSIS — M79641 Pain in right hand: Secondary | ICD-10-CM | POA: Diagnosis not present

## 2016-04-19 DIAGNOSIS — Z8572 Personal history of non-Hodgkin lymphomas: Secondary | ICD-10-CM

## 2016-04-19 DIAGNOSIS — S60511A Abrasion of right hand, initial encounter: Secondary | ICD-10-CM | POA: Insufficient documentation

## 2016-04-19 DIAGNOSIS — T148XXA Other injury of unspecified body region, initial encounter: Secondary | ICD-10-CM

## 2016-04-19 DIAGNOSIS — M25562 Pain in left knee: Secondary | ICD-10-CM | POA: Diagnosis not present

## 2016-04-19 MED ORDER — ACETAMINOPHEN 325 MG PO TABS
650.0000 mg | ORAL_TABLET | Freq: Once | ORAL | Status: AC
  Administered 2016-04-19: 650 mg via ORAL
  Filled 2016-04-19: qty 2

## 2016-04-19 MED ORDER — ACETAMINOPHEN 500 MG PO TABS: 500.0000 mg | ORAL_TABLET | Freq: Four times a day (QID) | ORAL | 0 refills | Status: AC | PRN

## 2016-04-19 NOTE — ED Triage Notes (Signed)
PT HAD A TRIP AND FALL IN A PARKING LOT. PT C/O LEFT KNEE PAIN AND SWELLING. PT ALSO HAS AN ABRASION TO THE RIGHT HAND. PT DOESN'T REMEMBER HITTING HER HEAD, BUT STS HER HEAD "FEELS FUNNY". DENIES LOC.

## 2016-04-19 NOTE — ED Provider Notes (Signed)
[name] Provider [facility]   CSN: [id number] Arrival date & time: 04/19/16  1533     History   Chief Complaint Chief Complaint  Patient presents with  . Fall    HPI Darlene [name] is a 81 y.o. female.  The history is provided by the patient and a relative. No language interpreter was used.  Fall     Darlene [name] is a 81 y.o. female who presents to the Emergency Department complaining of fall.  Hx is provided by the patient and her daughter. She presents for evaluation of injuries long mechanical fall. She was walking a parking lot and tripped over a curb and fell forward, landing on a flexed left knee and with her right arm outstretched. She reports feeling drowsy but denies any head injury or loss of consciousness. She has pain and bleeding to the right hand as well as pain to her left knee. No hip pain, chest pain, headache, abdominal pain. She takes a baby aspirin daily. She denies any additional injuries.  Past Medical History:  Diagnosis Date  . Anxiety   . Breast cancer, stage 1, estrogen receptor negative ([place]) 07/06/2011  . Cancer of breast ([place])   . Cataract   . Chronic ITP (idiopathic thrombocytopenia) (HCC) 07/06/2011  . Constipation    takes Colace daily  . COPD (chronic obstructive pulmonary disease) (HCC)    mild  . Depression    "nervous break down age 51"  . GERD (gastroesophageal reflux disease)    takes Nexium daily  . History of colon polyps   . History of depression    received shock treatments   . History of palpitations    takes Metoprolol daily  . History of vertigo   . HOH (hard of hearing)    has hearing aids  . Hyperlipidemia    takes Lovastatin daily  . Hypothyroid 07/06/2011   takes SYnthroid daily  . Joint pain   . Joint swelling   . Lymphoma ([place])   . Lymphoma ([place])    B cell   . Lymphoma of lymph nodes of head, face, and/or neck ([place]) 07/06/2011  . Neuropathy ([place])   . Nocturia   . Osteopenia   . Osteoporosis    hx of  and take Reclast  . Peripheral neuropathy ([place])   . Right knee DJD   . Shortness of breath dyspnea    with exertion  . Thyroid disease   . Umbilical hernia     Patient Active Problem List   Diagnosis Date Noted  . Weight loss 11/19/2015  . Chronic constipation 11/19/2015  . Other specified hypothyroidism 11/14/2015  . DJD (degenerative joint disease) of knee 06/19/2013  . Right knee DJD   . Hyperlipidemia   . Thyroid disease   . Chest pain 12/26/2012  . Unstable angina ([place]) 12/03/2012  . Osteoporosis 12/03/2012  . Palpitations 12/03/2012  . Dyslipidemia 12/03/2012  . History of breast cancer in female 07/06/2011  . History of non-Hodgkin's lymphoma 07/06/2011  . Hypothyroid 07/06/2011  . Chronic ITP (idiopathic thrombocytopenia) (HCC) 07/06/2011    Past Surgical History:  Procedure Laterality Date  . ADENOIDECTOMY     as a child  . BREAST SURGERY Right   . BUNIONECTOMY Right   . CARDIAC CATHETERIZATION  2014  . COLONOSCOPY    . FOOT SURGERY Right   . INSERTION OF MESH N/A 04/17/2015   Procedure: INSERTION OF MESH;  Surgeon: Ralene Ok, MD;  Location: [facility];  Service:  General;  Laterality: N/A;  . KNEE SURGERY    . LEFT HEART CATHETERIZATION WITH CORONARY ANGIOGRAM N/A 12/05/2012   Procedure: LEFT HEART CATHETERIZATION WITH CORONARY ANGIOGRAM;  Surgeon: [name] Sine, MD;  Location: [facility] CATH LAB;  Service: Cardiovascular;  Laterality: N/A;  . right  breast surgery     d/t breast cancer  . TONSILLECTOMY     as a child  . TOTAL KNEE ARTHROPLASTY Right 06/19/2013   Procedure: TOTAL KNEE ARTHROPLASTY;  Surgeon: Lorn Junes, MD;  Location: [facility];  Service: Orthopedics;  Laterality: Right;  . TURBINATE REDUCTION    . UMBILICAL HERNIA REPAIR N/A 04/17/2015   Procedure: LAPAROSCOPIC UMBILICAL HERNIA REPAIR WITH MESH;  Surgeon: Ralene Ok, MD;  Location: [facility];  Service: General;  Laterality: N/A;  . UPPER GASTROINTESTINAL ENDOSCOPY      OB History    No data  available       Home Medications    Prior to Admission medications   Medication Sig Start Date End Date Taking? Authorizing Provider  aspirin 81 MG tablet Take 81 mg by mouth daily.   Yes Historical Provider, MD  cholecalciferol (VITAMIN D) 1000 UNITS tablet Take 1,000 Units by mouth daily.   Yes Historical Provider, MD  citalopram (CELEXA) 40 MG tablet Take 40 mg by mouth daily.   Yes Historical Provider, MD  Cyanocobalamin (VITAMIN B-12 PO) Take 1,000 mcg by mouth daily.   Yes Historical Provider, MD  esomeprazole (NEXIUM) 40 MG capsule Take 40 mg by mouth 2 (two) times daily before a meal.    Yes Historical Provider, MD  famciclovir (FAMVIR) 125 MG tablet Take 250 mg by mouth 2 (two) times daily as needed. For 5 days as needed for outbreak   Yes Historical Provider, MD  L-Methylfolate-Algae-B12-B6 Glade Stanford) 3-90.314-2-35 MG CAPS Take 1 tablet by mouth 2 (two) times daily. 03/20/15  Yes Historical Provider, MD  levothyroxine (SYNTHROID, LEVOTHROID) 75 MCG tablet Take 75 mcg by mouth daily.     Yes Historical Provider, MD  lovastatin (MEVACOR) 20 MG tablet Take 20 mg by mouth at bedtime.     Yes Historical Provider, MD  LYRICA 50 MG capsule Take 50 mg by mouth 2 (two) times daily.  12/25/14  Yes Historical Provider, MD  metoprolol succinate (TOPROL-XL) 25 MG 24 hr tablet Take 25 mg by mouth daily.  11/22/12  Yes Historical Provider, MD  Multiple Vitamins-Minerals (MULTIVITAMINS THER. W/MINERALS) TABS Take 1 tablet by mouth daily.     Yes Historical Provider, MD  Polyethyl Glycol-Propyl Glycol (SYSTANE) 0.4-0.3 % SOLN Apply 2 drops to eye 2 (two) times daily as needed (for dry eyes).   Yes Historical Provider, MD  Vitamin D, Ergocalciferol, (DRISDOL) 50000 UNITS CAPS Take 50,000 Units by mouth every 14 (fourteen) days.     Yes Historical Provider, MD  acetaminophen (TYLENOL) 500 MG tablet Take 1 tablet (500 mg total) by mouth every 6 (six) hours as needed for mild pain. 04/19/16   Darlene Reichert,  MD    Family History Family History  Problem Relation Age of Onset  . Hyperlipidemia Father   . Heart disease Father   . Heart disease Sister   . Cancer Brother   . Diabetes Son   . Heart disease Maternal Grandfather   . Colon cancer Neg Hx     Social History Social History  Substance Use Topics  . Smoking status: Former Smoker    Packs/day: 0.30    Types: Cigarettes  . Smokeless  tobacco: Never Used     Comment: quit smoking 1985  . Alcohol use No     Allergies   Oxycodone; Codeine; Doxycycline; Meperidine and related; Sulfa antibiotics; and Sulfacetamide sodium   Review of Systems Review of Systems  All other systems reviewed and are negative.    Physical Exam Updated Vital Signs BP 153/81 (BP Location: Left Arm)   Pulse 64   Temp 97.4 F (36.3 C) (Oral)   Resp 16   Ht 5\' 1"  (1.549 m)   Wt 132 lb (59.9 kg)   SpO2 98%   BMI 24.94 kg/m   Physical Exam  Constitutional: She is oriented to person, place, and time. She appears well-developed and well-nourished.  HENT:  Head: Normocephalic and atraumatic.  Cardiovascular: Normal rate and regular rhythm.   No murmur heard. Pulmonary/Chest: Effort normal and breath sounds normal. No respiratory distress.  Abdominal: Soft. There is no tenderness. There is no rebound and no guarding.  Musculoskeletal:  2+ radial pulses bilaterally. 2+ DP pulses bilaterally. Chronic deformity to the right wrist with no focal tenderness. There is abrasion over the right thenar eminence that is hemostatic with minimal local tenderness. Flexion and extension is intact in the digits of the right hand. Left knee with significant swelling, tenderness and fusion with small abrasion. Flexion and extension are intact in the knee. No hip tenderness to palpation.  Neurological: She is alert and oriented to person, place, and time.  Skin: Skin is warm and dry.  Psychiatric: She has a normal mood and affect. Her behavior is normal.  Nursing  note and vitals reviewed.    ED Treatments / Results  Labs (all labs ordered are listed, but only abnormal results are displayed) Labs Reviewed - No data to display  EKG  EKG Interpretation None       Radiology Dg Knee Complete 4 Views Left  Result Date: 04/19/2016 CLINICAL DATA:  Tripped over curb with knee pain, initial encounter EXAM: LEFT KNEE - COMPLETE 4+ VIEW COMPARISON:  None. FINDINGS: There is a stellate fracture of the patella without significant distraction of the fracture fragments. A large joint effusion is noted. Mild degenerative changes of the medial joint space are seen. Mild patellofemoral degenerative changes are noted as well. IMPRESSION: Mild degenerative change. Stellate fracture of the patella without significant distraction. Large joint effusion is noted. Electronically Signed   By: Inez Catalina M.D.   On: 04/19/2016 16:46   Dg Hand Complete Right  Result Date: 04/19/2016 CLINICAL DATA:  Trip and fall over curb with hand pain, initial encounter EXAM: RIGHT HAND - COMPLETE 3+ VIEW COMPARISON:  None. FINDINGS: Postsurgical changes are noted in the distal radius. Changes of healed fracture node in the midshaft of the fifth meta carpal. Mild degenerative changes are seen. More severe degenerative changes are noted at the first [facility] joint with some remodeling of the joint. No other focal abnormality is seen IMPRESSION: Postsurgical changes. Degenerative changes without acute abnormality. Electronically Signed   By: Inez Catalina M.D.   On: 04/19/2016 16:48    Procedures Procedures (including critical care time)  Medications Ordered in ED Medications  acetaminophen (TYLENOL) tablet 650 mg (650 mg Oral Given 04/19/16 1722)     Initial Impression / Assessment and Plan / ED Course  I have reviewed the triage vital signs and the nursing notes.  Pertinent labs & imaging results that were available during my care of the patient were reviewed by me and considered in  my medical  decision making (see chart for details).  Clinical Course     Patient here for evaluation of injuries find a mechanical fall. She is well perfused on examination with no evidence of head injury. Hand with abrasion with no evidence of tendon or bony injury. She does have a knee effusion and a patellar fracture. Will place a knee immobilizer with Tylenol for pain control. Discussed orthopedics follow-up and home care.  No evidence of hip fracture on exam.    Final Clinical Impressions(s) / ED Diagnoses   Final diagnoses:  Closed nondisplaced comminuted fracture of left patella, initial encounter  Fall, initial encounter  Abrasion    New Prescriptions Discharge Medication List as of 04/19/2016  6:03 PM       Darlene Reichert, MD 04/19/16 [phone]

## 2016-04-19 NOTE — Discharge Instructions (Signed)
You can take tylenol, available over the counter, as needed for pain.

## 2016-04-20 DIAGNOSIS — S82002A Unspecified fracture of left patella, initial encounter for closed fracture: Secondary | ICD-10-CM | POA: Diagnosis not present

## 2016-04-30 DIAGNOSIS — S82002D Unspecified fracture of left patella, subsequent encounter for closed fracture with routine healing: Secondary | ICD-10-CM | POA: Diagnosis not present

## 2016-05-14 ENCOUNTER — Ambulatory Visit (INDEPENDENT_AMBULATORY_CARE_PROVIDER_SITE_OTHER): Payer: Medicare Other | Admitting: Podiatry

## 2016-05-14 ENCOUNTER — Encounter: Payer: Self-pay | Admitting: Podiatry

## 2016-05-14 VITALS — Ht 61.0 in | Wt 130.0 lb

## 2016-05-14 DIAGNOSIS — B351 Tinea unguium: Secondary | ICD-10-CM | POA: Diagnosis not present

## 2016-05-14 DIAGNOSIS — M79676 Pain in unspecified toe(s): Secondary | ICD-10-CM | POA: Diagnosis not present

## 2016-05-14 DIAGNOSIS — S82002D Unspecified fracture of left patella, subsequent encounter for closed fracture with routine healing: Secondary | ICD-10-CM | POA: Diagnosis not present

## 2016-05-14 NOTE — Progress Notes (Signed)
Patient ID: Darlene [name], female   DOB: January 30, 1935, 82 y.o.   MRN: [id number] Complaint:  Visit Type: Patient returns to my office for continued preventative foot care services. Complaint: Patient states" my nails have grown long and thick and become painful to walk and wear shoes".  He presents for preventative foot care services. No changes to ROS  Podiatric Exam: Vascular: dorsalis pedis and posterior tibial pulses are palpable bilateral. Capillary return is immediate. Temperature gradient is WNL. Skin turgor WNL  Sensorium: Normal Semmes Weinstein monofilament test. Normal tactile sensation bilaterally. Nail Exam: Pt has thick disfigured discolored nails with subungual debris noted bilateral entire nail hallux through fifth toenails Ulcer Exam: There is no evidence of ulcer or pre-ulcerative changes or infection. Orthopedic Exam: Muscle tone and strength are WNL. No limitations in general ROM. No crepitus or effusions noted. Foot type and digits show no abnormalities. Bony prominences are unremarkable. Skin: No Porokeratosis. No infection or ulcers  Diagnosis:  Tinea unguium, Pain in right toe, pain in left toes  Treatment & Plan Procedures and Treatment: Consent by patient was obtained for treatment procedures. The patient understood the discussion of treatment and procedures well. All questions were answered thoroughly reviewed. Debridement of mycotic and hypertrophic toenails, 1 through 5 bilateral and clearing of subungual debris. No ulceration, no infection noted.  Return Visit-Office Procedure: Patient instructed to return to the office for a follow up visit 3 months for continued evaluation and treatment.  Gardiner Barefoot DPM

## 2016-05-18 DIAGNOSIS — E785 Hyperlipidemia, unspecified: Secondary | ICD-10-CM | POA: Diagnosis not present

## 2016-05-18 DIAGNOSIS — E039 Hypothyroidism, unspecified: Secondary | ICD-10-CM | POA: Diagnosis not present

## 2016-05-18 DIAGNOSIS — E559 Vitamin D deficiency, unspecified: Secondary | ICD-10-CM | POA: Diagnosis not present

## 2016-05-18 DIAGNOSIS — M810 Age-related osteoporosis without current pathological fracture: Secondary | ICD-10-CM | POA: Diagnosis not present

## 2016-05-25 DIAGNOSIS — F339 Major depressive disorder, recurrent, unspecified: Secondary | ICD-10-CM | POA: Diagnosis not present

## 2016-05-25 DIAGNOSIS — Z23 Encounter for immunization: Secondary | ICD-10-CM | POA: Diagnosis not present

## 2016-05-25 DIAGNOSIS — J449 Chronic obstructive pulmonary disease, unspecified: Secondary | ICD-10-CM | POA: Diagnosis not present

## 2016-05-25 DIAGNOSIS — D696 Thrombocytopenia, unspecified: Secondary | ICD-10-CM | POA: Diagnosis not present

## 2016-05-25 DIAGNOSIS — I700 Atherosclerosis of aorta: Secondary | ICD-10-CM | POA: Diagnosis not present

## 2016-06-04 DIAGNOSIS — S82002D Unspecified fracture of left patella, subsequent encounter for closed fracture with routine healing: Secondary | ICD-10-CM | POA: Diagnosis not present

## 2016-06-18 DIAGNOSIS — S82002D Unspecified fracture of left patella, subsequent encounter for closed fracture with routine healing: Secondary | ICD-10-CM | POA: Diagnosis not present

## 2016-06-22 DIAGNOSIS — M25662 Stiffness of left knee, not elsewhere classified: Secondary | ICD-10-CM | POA: Diagnosis not present

## 2016-06-22 DIAGNOSIS — S82092D Other fracture of left patella, subsequent encounter for closed fracture with routine healing: Secondary | ICD-10-CM | POA: Diagnosis not present

## 2016-06-22 DIAGNOSIS — M25562 Pain in left knee: Secondary | ICD-10-CM | POA: Diagnosis not present

## 2016-06-22 DIAGNOSIS — R531 Weakness: Secondary | ICD-10-CM | POA: Diagnosis not present

## 2016-06-25 DIAGNOSIS — S82092D Other fracture of left patella, subsequent encounter for closed fracture with routine healing: Secondary | ICD-10-CM | POA: Diagnosis not present

## 2016-06-25 DIAGNOSIS — R531 Weakness: Secondary | ICD-10-CM | POA: Diagnosis not present

## 2016-06-25 DIAGNOSIS — M25662 Stiffness of left knee, not elsewhere classified: Secondary | ICD-10-CM | POA: Diagnosis not present

## 2016-06-25 DIAGNOSIS — M25562 Pain in left knee: Secondary | ICD-10-CM | POA: Diagnosis not present

## 2016-06-29 DIAGNOSIS — M25562 Pain in left knee: Secondary | ICD-10-CM | POA: Diagnosis not present

## 2016-06-29 DIAGNOSIS — R531 Weakness: Secondary | ICD-10-CM | POA: Diagnosis not present

## 2016-06-29 DIAGNOSIS — M25662 Stiffness of left knee, not elsewhere classified: Secondary | ICD-10-CM | POA: Diagnosis not present

## 2016-06-29 DIAGNOSIS — S82092D Other fracture of left patella, subsequent encounter for closed fracture with routine healing: Secondary | ICD-10-CM | POA: Diagnosis not present

## 2016-06-30 DIAGNOSIS — M25662 Stiffness of left knee, not elsewhere classified: Secondary | ICD-10-CM | POA: Diagnosis not present

## 2016-06-30 DIAGNOSIS — R531 Weakness: Secondary | ICD-10-CM | POA: Diagnosis not present

## 2016-06-30 DIAGNOSIS — M25562 Pain in left knee: Secondary | ICD-10-CM | POA: Diagnosis not present

## 2016-06-30 DIAGNOSIS — S82092D Other fracture of left patella, subsequent encounter for closed fracture with routine healing: Secondary | ICD-10-CM | POA: Diagnosis not present

## 2016-07-02 DIAGNOSIS — S82092D Other fracture of left patella, subsequent encounter for closed fracture with routine healing: Secondary | ICD-10-CM | POA: Diagnosis not present

## 2016-07-06 DIAGNOSIS — M25562 Pain in left knee: Secondary | ICD-10-CM | POA: Diagnosis not present

## 2016-07-06 DIAGNOSIS — M25662 Stiffness of left knee, not elsewhere classified: Secondary | ICD-10-CM | POA: Diagnosis not present

## 2016-07-06 DIAGNOSIS — S82092D Other fracture of left patella, subsequent encounter for closed fracture with routine healing: Secondary | ICD-10-CM | POA: Diagnosis not present

## 2016-07-06 DIAGNOSIS — R531 Weakness: Secondary | ICD-10-CM | POA: Diagnosis not present

## 2016-07-07 ENCOUNTER — Other Ambulatory Visit: Payer: Self-pay | Admitting: Hematology and Oncology

## 2016-07-07 ENCOUNTER — Telehealth: Payer: Self-pay | Admitting: Hematology and Oncology

## 2016-07-07 ENCOUNTER — Encounter: Payer: Self-pay | Admitting: Hematology and Oncology

## 2016-07-07 ENCOUNTER — Other Ambulatory Visit (HOSPITAL_BASED_OUTPATIENT_CLINIC_OR_DEPARTMENT_OTHER): Payer: Medicare Other

## 2016-07-07 ENCOUNTER — Ambulatory Visit (HOSPITAL_BASED_OUTPATIENT_CLINIC_OR_DEPARTMENT_OTHER): Payer: Medicare Other | Admitting: Hematology and Oncology

## 2016-07-07 DIAGNOSIS — Z8572 Personal history of non-Hodgkin lymphomas: Secondary | ICD-10-CM

## 2016-07-07 DIAGNOSIS — D693 Immune thrombocytopenic purpura: Secondary | ICD-10-CM

## 2016-07-07 DIAGNOSIS — K5909 Other constipation: Secondary | ICD-10-CM

## 2016-07-07 DIAGNOSIS — Z853 Personal history of malignant neoplasm of breast: Secondary | ICD-10-CM

## 2016-07-07 LAB — COMPREHENSIVE METABOLIC PANEL
ALT: 17 U/L (ref 0–55)
AST: 14 U/L (ref 5–34)
Albumin: 3.9 g/dL (ref 3.5–5.0)
Alkaline Phosphatase: 58 U/L (ref 40–150)
Anion Gap: 8 mEq/L (ref 3–11)
BUN: 19.9 mg/dL (ref 7.0–26.0)
CHLORIDE: 103 meq/L (ref 98–109)
CO2: 24 mEq/L (ref 22–29)
CREATININE: 0.7 mg/dL (ref 0.6–1.1)
Calcium: 10.2 mg/dL (ref 8.4–10.4)
EGFR: 82 mL/min/{1.73_m2} — ABNORMAL LOW (ref >90)
GLUCOSE: 85 mg/dL (ref 70–140)
POTASSIUM: 4.8 meq/L (ref 3.5–5.1)
SODIUM: 135 meq/L — AB (ref 136–145)
Total Bilirubin: 0.61 mg/dL (ref 0.20–1.20)
Total Protein: 6.6 g/dL (ref 6.4–8.3)

## 2016-07-07 LAB — CBC WITH DIFFERENTIAL/PLATELET
BASO%: 1 % (ref 0.0–2.0)
BASOS ABS: 0.1 10*3/uL (ref 0.0–0.1)
EOS ABS: 0.1 10*3/uL (ref 0.0–0.5)
EOS%: 1.9 % (ref 0.0–7.0)
HCT: 35.6 % (ref 34.8–46.6)
HGB: 11.6 g/dL (ref 11.6–15.9)
LYMPH%: 22.6 % (ref 14.0–49.7)
MCH: 28.7 pg (ref 25.1–34.0)
MCHC: 32.7 g/dL (ref 31.5–36.0)
MCV: 87.7 fL (ref 79.5–101.0)
MONO#: 0.4 10*3/uL (ref 0.1–0.9)
MONO%: 7.1 % (ref 0.0–14.0)
NEUT%: 67.4 % (ref 38.4–76.8)
NEUTROS ABS: 4.3 10*3/uL (ref 1.5–6.5)
PLATELETS: 122 10*3/uL — AB (ref 145–400)
RBC: 4.06 10*6/uL (ref 3.70–5.45)
RDW: 13.5 % (ref 11.2–14.5)
WBC: 6.3 10*3/uL (ref 3.9–10.3)
lymph#: 1.4 10*3/uL (ref 0.9–3.3)

## 2016-07-07 LAB — LACTATE DEHYDROGENASE: LDH: 175 U/L (ref 125–245)

## 2016-07-07 NOTE — Assessment & Plan Note (Signed)
I recommend conservative management and laxatives as needed

## 2016-07-07 NOTE — Progress Notes (Signed)
[name] OFFICE PROGRESS NOTE  Patient Care Team: Thressa Sheller, MD as PCP - General (Internal Medicine)  SUMMARY OF ONCOLOGIC HISTORY:  Darlene [name] was transferred to my care after her prior physician has left.  I reviewed the patient's records extensive and collaborated the history with the patient. 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 She never required any systemic treatment. 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 This has also resolved on its own. She has never developed any additional adenopathy other than the single lymph node palpable in the left supraclavicular fossa.  She did develop a second primary stage I,  0.8 cm,  ER negative cancer of the right breast, treated with lumpectomy, radiation and six cycles of CMF chemotherapy in 1994. No evidence of a new disease from the breast cancer either. Most recent mammogram done in June 2015 showed stable fibroglandular changes in her breasts. She was called back for an ultrasound of the left breast which also came back benign. She had a CT scan of the abdomen done because of significant intentional weight loss and abdominal discomfort.  Imaging studies show no evidence of lymphoma.  Moderate stool burden was noted  INTERVAL HISTORY: Please see below for problem oriented charting. She feels well. No new lymphadenopathy. She continues to have chronic constipation. She is taking regular meals now and has started to gain some weight. She had a balance issue with recurrent falls.  She uses a walker on a regular basis. She denies any recent abnormal breast examination, palpable mass, abnormal breast appearance or nipple changes The patient denies any recent signs or symptoms of bleeding such  as spontaneous epistaxis, hematuria or hematochezia. She denies recent infection  REVIEW OF SYSTEMS:   Constitutional: Denies fevers, chills or abnormal weight loss Eyes: Denies blurriness of vision Ears, nose, mouth, throat, and face: Denies mucositis or sore throat Respiratory: Denies cough, dyspnea or wheezes Cardiovascular: Denies palpitation, chest discomfort or lower extremity swelling Skin: Denies abnormal skin rashes Lymphatics: Denies new lymphadenopathy or easy bruising Neurological:Denies numbness, tingling or new weaknesses Behavioral/Psych: Mood is stable, no new changes  All other systems were reviewed with the patient and are negative.  I have reviewed the past medical history, past surgical history, social history and family history with the patient and they are unchanged from previous note.  ALLERGIES:  is allergic to oxycodone; codeine; doxycycline; meperidine and related; sulfa antibiotics; and sulfacetamide sodium.  MEDICATIONS:  Current Outpatient Prescriptions  Medication Sig Dispense Refill  . acetaminophen (TYLENOL) 500 MG tablet Take 1 tablet (500 mg total) by mouth every 6 (six) hours as needed for mild pain. 30 tablet 0  . aspirin 81 MG tablet Take 81 mg by mouth daily.    . cholecalciferol (VITAMIN D) 1000 UNITS tablet Take 1,000 Units by mouth daily.    . citalopram (CELEXA) 40 MG tablet Take 40 mg by mouth daily.    . Cyanocobalamin (VITAMIN B-12 PO) Take 1,000 mcg by mouth daily.    Marland Kitchen esomeprazole (NEXIUM) 40 MG capsule Take 40 mg by mouth 2 (two) times daily before a meal.     . famciclovir (FAMVIR) 125 MG tablet Take 250 mg by mouth 2 (two) times daily as needed. For 5 days as needed for outbreak    . levothyroxine (SYNTHROID,  LEVOTHROID) 75 MCG tablet Take 75 mcg by mouth daily.      Marland Kitchen lovastatin (MEVACOR) 20 MG tablet Take 20 mg by mouth at bedtime.      Marland Kitchen LYRICA 50 MG capsule Take 50 mg by mouth 2 (two) times daily.     . metoprolol succinate  (TOPROL-XL) 25 MG 24 hr tablet Take 25 mg by mouth daily.     . Multiple Vitamins-Minerals (MULTIVITAMINS THER. W/MINERALS) TABS Take 1 tablet by mouth daily.      Vladimir Faster Glycol-Propyl Glycol (SYSTANE) 0.4-0.3 % SOLN Apply 2 drops to eye 2 (two) times daily as needed (for dry eyes).    . Vitamin D, Ergocalciferol, (DRISDOL) 50000 UNITS CAPS Take 50,000 Units by mouth every 14 (fourteen) days.      Marland Kitchen L-Methylfolate-Algae-B12-B6 (METANX) 3-90.314-2-35 MG CAPS Take 1 tablet by mouth 2 (two) times daily.     No current facility-administered medications for this visit.     PHYSICAL EXAMINATION: ECOG PERFORMANCE STATUS: 2 - Symptomatic, <50% confined to bed  Vitals:   07/07/16 1053  BP: 130/67  Pulse: 66  Resp: 17  Temp: 98 F (36.7 C)   Filed Weights   07/07/16 1053  Weight: 155 lb 12.8 oz (70.7 kg)    GENERAL:alert, no distress and comfortable SKIN: skin color, texture, turgor are normal, no rashes or significant lesions EYES: normal, Conjunctiva are pink and non-injected, sclera clear OROPHARYNX:no exudate, no erythema and lips, buccal mucosa, and tongue normal  NECK: supple, thyroid normal size, non-tender, without nodularity LYMPH:  no palpable lymphadenopathy in the cervical, axillary or inguinal LUNGS: clear to auscultation and percussion with normal breathing effort HEART: regular rate & rhythm and no murmurs and no lower extremity edema ABDOMEN:abdomen soft, non-tender and normal bowel sounds Musculoskeletal:no cyanosis of digits and no clubbing  NEURO: alert & oriented x 3 with fluent speech, no focal motor/sensory deficits Bilateral breast examination were performed.  Well-healed surgical scar on the right breast without any other abnormalities.  LABORATORY DATA:  I have reviewed the data as listed    Component Value Date/Time   NA 139 11/18/2015 1117   K 4.1 11/18/2015 1117   CL 106 04/17/2015 0757   CL 106 07/11/2012 0906   CO2 25 11/18/2015 1117   GLUCOSE  93 11/18/2015 1117   GLUCOSE 107 (H) 07/11/2012 0906   BUN 16.5 11/18/2015 1117   CREATININE 0.7 11/18/2015 1117   CALCIUM 10.3 11/18/2015 1117   PROT 7.0 11/18/2015 1117   ALBUMIN 4.2 11/18/2015 1117   AST 15 11/18/2015 1117   ALT 18 11/18/2015 1117   ALKPHOS 48 11/18/2015 1117   BILITOT 0.99 11/18/2015 1117   GFRNONAA >60 04/17/2015 0757   GFRAA >60 04/17/2015 0757    No results found for: SPEP, UPEP  Lab Results  Component Value Date   WBC 6.3 07/07/2016   NEUTROABS 4.3 07/07/2016   HGB 11.6 07/07/2016   HCT 35.6 07/07/2016   MCV 87.7 07/07/2016   PLT 122 (L) 07/07/2016      Chemistry      Component Value Date/Time   NA 139 11/18/2015 1117   K 4.1 11/18/2015 1117   CL 106 04/17/2015 0757   CL 106 07/11/2012 0906   CO2 25 11/18/2015 1117   BUN 16.5 11/18/2015 1117   CREATININE 0.7 11/18/2015 1117      Component Value Date/Time   CALCIUM 10.3 11/18/2015 1117   ALKPHOS 48 11/18/2015 1117   AST 15 11/18/2015 1117  ALT 18 11/18/2015 1117   BILITOT 0.99 11/18/2015 1117       ASSESSMENT & PLAN:  History of breast cancer in female Clinically, she have no signs of recurrence. She will continue screening mammogram    History of non-Hodgkin's lymphoma Clinically, she have no signs of lymphoma recurrence.  I will continue history, physical examination blood work and yearly examination.      Chronic ITP (idiopathic thrombocytopenia) (HCC) The cause is likely ITP. It is mild and there is little change compared from previous platelet count. The patient denies recent history of bleeding such as epistaxis, hematuria or hematochezia. She is asymptomatic from the thrombocytopenia. I will observe for now  Chronic constipation I recommend conservative management and laxatives as needed   No orders of the defined types were placed in this encounter.  All questions were answered. The patient knows to call the clinic with any problems, questions or concerns. No  barriers to learning was detected. I spent 15 minutes counseling the patient face to face. The total time spent in the appointment was 20 minutes and more than 50% was on counseling and review of test results     Heath Lark, MD 07/07/2016 11:19 AM

## 2016-07-07 NOTE — Assessment & Plan Note (Signed)
Clinically, she have no signs of lymphoma recurrence. I will continue history, physical examination blood work and yearly examination. 

## 2016-07-07 NOTE — Assessment & Plan Note (Signed)
Clinically, she have no signs of recurrence. She will continue screening mammogram   

## 2016-07-07 NOTE — Telephone Encounter (Signed)
Gave patient AVS and calender for next year per 07/07/2016 los

## 2016-07-07 NOTE — Assessment & Plan Note (Signed)
The cause is likely ITP. It is mild and there is little change compared from previous platelet count. The patient denies recent history of bleeding such as epistaxis, hematuria or hematochezia. She is asymptomatic from the thrombocytopenia. I will observe for now

## 2016-07-09 DIAGNOSIS — M25662 Stiffness of left knee, not elsewhere classified: Secondary | ICD-10-CM | POA: Diagnosis not present

## 2016-07-09 DIAGNOSIS — R531 Weakness: Secondary | ICD-10-CM | POA: Diagnosis not present

## 2016-07-09 DIAGNOSIS — S82092D Other fracture of left patella, subsequent encounter for closed fracture with routine healing: Secondary | ICD-10-CM | POA: Diagnosis not present

## 2016-07-09 DIAGNOSIS — M25562 Pain in left knee: Secondary | ICD-10-CM | POA: Diagnosis not present

## 2016-07-10 DIAGNOSIS — M25562 Pain in left knee: Secondary | ICD-10-CM | POA: Diagnosis not present

## 2016-07-10 DIAGNOSIS — S82092D Other fracture of left patella, subsequent encounter for closed fracture with routine healing: Secondary | ICD-10-CM | POA: Diagnosis not present

## 2016-07-10 DIAGNOSIS — R531 Weakness: Secondary | ICD-10-CM | POA: Diagnosis not present

## 2016-07-10 DIAGNOSIS — M25662 Stiffness of left knee, not elsewhere classified: Secondary | ICD-10-CM | POA: Diagnosis not present

## 2016-07-17 DIAGNOSIS — M25562 Pain in left knee: Secondary | ICD-10-CM | POA: Diagnosis not present

## 2016-07-17 DIAGNOSIS — M25662 Stiffness of left knee, not elsewhere classified: Secondary | ICD-10-CM | POA: Diagnosis not present

## 2016-07-17 DIAGNOSIS — R531 Weakness: Secondary | ICD-10-CM | POA: Diagnosis not present

## 2016-07-17 DIAGNOSIS — S82092D Other fracture of left patella, subsequent encounter for closed fracture with routine healing: Secondary | ICD-10-CM | POA: Diagnosis not present

## 2016-07-20 DIAGNOSIS — S82092D Other fracture of left patella, subsequent encounter for closed fracture with routine healing: Secondary | ICD-10-CM | POA: Diagnosis not present

## 2016-07-20 DIAGNOSIS — M25662 Stiffness of left knee, not elsewhere classified: Secondary | ICD-10-CM | POA: Diagnosis not present

## 2016-07-20 DIAGNOSIS — R531 Weakness: Secondary | ICD-10-CM | POA: Diagnosis not present

## 2016-07-20 DIAGNOSIS — M25562 Pain in left knee: Secondary | ICD-10-CM | POA: Diagnosis not present

## 2016-07-23 DIAGNOSIS — S82092D Other fracture of left patella, subsequent encounter for closed fracture with routine healing: Secondary | ICD-10-CM | POA: Diagnosis not present

## 2016-07-23 DIAGNOSIS — R531 Weakness: Secondary | ICD-10-CM | POA: Diagnosis not present

## 2016-07-23 DIAGNOSIS — M25562 Pain in left knee: Secondary | ICD-10-CM | POA: Diagnosis not present

## 2016-07-23 DIAGNOSIS — M25662 Stiffness of left knee, not elsewhere classified: Secondary | ICD-10-CM | POA: Diagnosis not present

## 2016-07-27 DIAGNOSIS — R531 Weakness: Secondary | ICD-10-CM | POA: Diagnosis not present

## 2016-07-27 DIAGNOSIS — M25562 Pain in left knee: Secondary | ICD-10-CM | POA: Diagnosis not present

## 2016-07-27 DIAGNOSIS — M25662 Stiffness of left knee, not elsewhere classified: Secondary | ICD-10-CM | POA: Diagnosis not present

## 2016-07-27 DIAGNOSIS — S82092D Other fracture of left patella, subsequent encounter for closed fracture with routine healing: Secondary | ICD-10-CM | POA: Diagnosis not present

## 2016-07-28 DIAGNOSIS — M25562 Pain in left knee: Secondary | ICD-10-CM | POA: Diagnosis not present

## 2016-07-28 DIAGNOSIS — S82092D Other fracture of left patella, subsequent encounter for closed fracture with routine healing: Secondary | ICD-10-CM | POA: Diagnosis not present

## 2016-07-28 DIAGNOSIS — R531 Weakness: Secondary | ICD-10-CM | POA: Diagnosis not present

## 2016-07-28 DIAGNOSIS — M25662 Stiffness of left knee, not elsewhere classified: Secondary | ICD-10-CM | POA: Diagnosis not present

## 2016-07-30 DIAGNOSIS — S82092D Other fracture of left patella, subsequent encounter for closed fracture with routine healing: Secondary | ICD-10-CM | POA: Diagnosis not present

## 2016-08-06 ENCOUNTER — Ambulatory Visit: Payer: Medicare Other | Admitting: Podiatry

## 2016-08-06 DIAGNOSIS — W57XXXA Bitten or stung by nonvenomous insect and other nonvenomous arthropods, initial encounter: Secondary | ICD-10-CM | POA: Diagnosis not present

## 2016-08-06 DIAGNOSIS — S80861A Insect bite (nonvenomous), right lower leg, initial encounter: Secondary | ICD-10-CM | POA: Diagnosis not present

## 2016-08-07 ENCOUNTER — Ambulatory Visit: Payer: Medicare Other | Admitting: Podiatry

## 2016-08-07 ENCOUNTER — Encounter: Payer: Self-pay | Admitting: Podiatry

## 2016-08-07 ENCOUNTER — Ambulatory Visit (INDEPENDENT_AMBULATORY_CARE_PROVIDER_SITE_OTHER): Payer: Medicare Other | Admitting: Podiatry

## 2016-08-07 DIAGNOSIS — M79676 Pain in unspecified toe(s): Secondary | ICD-10-CM

## 2016-08-07 DIAGNOSIS — B351 Tinea unguium: Secondary | ICD-10-CM | POA: Diagnosis not present

## 2016-08-07 NOTE — Progress Notes (Signed)
Patient ID: Darlene [name], female   DOB: 03/12/1935, 82 y.o.   MRN: [id number] Complaint:  Visit Type: Patient returns to my office for continued preventative foot care services. Complaint: Patient states" my nails have grown long and thick and become painful to walk and wear shoes".  He presents for preventative foot care services. No changes to ROS  Podiatric Exam: Vascular: dorsalis pedis and posterior tibial pulses are palpable bilateral. Capillary return is immediate. Temperature gradient is WNL. Skin turgor WNL  Sensorium: Normal Semmes Weinstein monofilament test. Normal tactile sensation bilaterally. Nail Exam: Pt has thick disfigured discolored nails with subungual debris noted bilateral entire nail hallux through fifth toenails Ulcer Exam: There is no evidence of ulcer or pre-ulcerative changes or infection. Orthopedic Exam: Muscle tone and strength are WNL. No limitations in general ROM. No crepitus or effusions noted. Foot type and digits show no abnormalities. Bony prominences are unremarkable. Skin: No Porokeratosis. No infection or ulcers  Diagnosis:  Tinea unguium, Pain in right toe, pain in left toes  Treatment & Plan Procedures and Treatment: Consent by patient was obtained for treatment procedures. The patient understood the discussion of treatment and procedures well. All questions were answered thoroughly reviewed. Debridement of mycotic and hypertrophic toenails, 1 through 5 bilateral and clearing of subungual debris. No ulceration, no infection noted.  Return Visit-Office Procedure: Patient instructed to return to the office for a follow up visit 3 months for continued evaluation and treatment.

## 2016-08-14 DIAGNOSIS — H52203 Unspecified astigmatism, bilateral: Secondary | ICD-10-CM | POA: Diagnosis not present

## 2016-08-14 DIAGNOSIS — H2513 Age-related nuclear cataract, bilateral: Secondary | ICD-10-CM | POA: Diagnosis not present

## 2016-09-07 DIAGNOSIS — J329 Chronic sinusitis, unspecified: Secondary | ICD-10-CM | POA: Diagnosis not present

## 2016-09-22 ENCOUNTER — Other Ambulatory Visit: Payer: Self-pay | Admitting: Hematology and Oncology

## 2016-09-22 DIAGNOSIS — Z1231 Encounter for screening mammogram for malignant neoplasm of breast: Secondary | ICD-10-CM

## 2016-09-30 DIAGNOSIS — R2689 Other abnormalities of gait and mobility: Secondary | ICD-10-CM | POA: Diagnosis not present

## 2016-09-30 DIAGNOSIS — R2681 Unsteadiness on feet: Secondary | ICD-10-CM | POA: Diagnosis not present

## 2016-09-30 DIAGNOSIS — Z9181 History of falling: Secondary | ICD-10-CM | POA: Diagnosis not present

## 2016-10-07 DIAGNOSIS — R296 Repeated falls: Secondary | ICD-10-CM | POA: Diagnosis not present

## 2016-10-07 DIAGNOSIS — Z7982 Long term (current) use of aspirin: Secondary | ICD-10-CM | POA: Diagnosis not present

## 2016-10-07 DIAGNOSIS — R269 Unspecified abnormalities of gait and mobility: Secondary | ICD-10-CM | POA: Diagnosis not present

## 2016-10-07 DIAGNOSIS — Z9181 History of falling: Secondary | ICD-10-CM | POA: Diagnosis not present

## 2016-10-09 DIAGNOSIS — Z9181 History of falling: Secondary | ICD-10-CM | POA: Diagnosis not present

## 2016-10-09 DIAGNOSIS — R269 Unspecified abnormalities of gait and mobility: Secondary | ICD-10-CM | POA: Diagnosis not present

## 2016-10-09 DIAGNOSIS — R296 Repeated falls: Secondary | ICD-10-CM | POA: Diagnosis not present

## 2016-10-09 DIAGNOSIS — Z7982 Long term (current) use of aspirin: Secondary | ICD-10-CM | POA: Diagnosis not present

## 2016-10-12 ENCOUNTER — Ambulatory Visit
Admission: RE | Admit: 2016-10-12 | Discharge: 2016-10-12 | Disposition: A | Discharge status: Home / Self Care | Payer: Medicare Other | Source: Ambulatory Visit | Attending: Hematology and Oncology | Admitting: Hematology and Oncology

## 2016-10-12 DIAGNOSIS — Z1231 Encounter for screening mammogram for malignant neoplasm of breast: Secondary | ICD-10-CM

## 2016-10-12 DIAGNOSIS — R269 Unspecified abnormalities of gait and mobility: Secondary | ICD-10-CM | POA: Diagnosis not present

## 2016-10-12 DIAGNOSIS — Z7982 Long term (current) use of aspirin: Secondary | ICD-10-CM | POA: Diagnosis not present

## 2016-10-12 DIAGNOSIS — Z9181 History of falling: Secondary | ICD-10-CM | POA: Diagnosis not present

## 2016-10-12 DIAGNOSIS — R296 Repeated falls: Secondary | ICD-10-CM | POA: Diagnosis not present

## 2016-10-12 HISTORY — DX: Personal history of antineoplastic chemotherapy: Z92.21

## 2016-10-12 HISTORY — DX: Personal history of irradiation: Z92.3

## 2016-10-14 DIAGNOSIS — Z9181 History of falling: Secondary | ICD-10-CM | POA: Diagnosis not present

## 2016-10-14 DIAGNOSIS — R269 Unspecified abnormalities of gait and mobility: Secondary | ICD-10-CM | POA: Diagnosis not present

## 2016-10-14 DIAGNOSIS — R296 Repeated falls: Secondary | ICD-10-CM | POA: Diagnosis not present

## 2016-10-14 DIAGNOSIS — Z7982 Long term (current) use of aspirin: Secondary | ICD-10-CM | POA: Diagnosis not present

## 2016-10-16 DIAGNOSIS — Z9181 History of falling: Secondary | ICD-10-CM | POA: Diagnosis not present

## 2016-10-16 DIAGNOSIS — R269 Unspecified abnormalities of gait and mobility: Secondary | ICD-10-CM | POA: Diagnosis not present

## 2016-10-16 DIAGNOSIS — Z7982 Long term (current) use of aspirin: Secondary | ICD-10-CM | POA: Diagnosis not present

## 2016-10-16 DIAGNOSIS — R296 Repeated falls: Secondary | ICD-10-CM | POA: Diagnosis not present

## 2016-10-16 DIAGNOSIS — W540XXA Bitten by dog, initial encounter: Secondary | ICD-10-CM | POA: Diagnosis not present

## 2016-10-16 DIAGNOSIS — S60572A Other superficial bite of hand of left hand, initial encounter: Secondary | ICD-10-CM | POA: Diagnosis not present

## 2016-10-19 DIAGNOSIS — R296 Repeated falls: Secondary | ICD-10-CM | POA: Diagnosis not present

## 2016-10-19 DIAGNOSIS — R269 Unspecified abnormalities of gait and mobility: Secondary | ICD-10-CM | POA: Diagnosis not present

## 2016-10-19 DIAGNOSIS — Z9181 History of falling: Secondary | ICD-10-CM | POA: Diagnosis not present

## 2016-10-19 DIAGNOSIS — Z7982 Long term (current) use of aspirin: Secondary | ICD-10-CM | POA: Diagnosis not present

## 2016-10-21 DIAGNOSIS — R296 Repeated falls: Secondary | ICD-10-CM | POA: Diagnosis not present

## 2016-10-21 DIAGNOSIS — Z7982 Long term (current) use of aspirin: Secondary | ICD-10-CM | POA: Diagnosis not present

## 2016-10-21 DIAGNOSIS — R269 Unspecified abnormalities of gait and mobility: Secondary | ICD-10-CM | POA: Diagnosis not present

## 2016-10-21 DIAGNOSIS — Z9181 History of falling: Secondary | ICD-10-CM | POA: Diagnosis not present

## 2016-10-23 DIAGNOSIS — R269 Unspecified abnormalities of gait and mobility: Secondary | ICD-10-CM | POA: Diagnosis not present

## 2016-10-23 DIAGNOSIS — Z9181 History of falling: Secondary | ICD-10-CM | POA: Diagnosis not present

## 2016-10-23 DIAGNOSIS — Z7982 Long term (current) use of aspirin: Secondary | ICD-10-CM | POA: Diagnosis not present

## 2016-10-23 DIAGNOSIS — R296 Repeated falls: Secondary | ICD-10-CM | POA: Diagnosis not present

## 2016-10-26 DIAGNOSIS — Z9181 History of falling: Secondary | ICD-10-CM | POA: Diagnosis not present

## 2016-10-26 DIAGNOSIS — Z7982 Long term (current) use of aspirin: Secondary | ICD-10-CM | POA: Diagnosis not present

## 2016-10-26 DIAGNOSIS — R296 Repeated falls: Secondary | ICD-10-CM | POA: Diagnosis not present

## 2016-10-26 DIAGNOSIS — R269 Unspecified abnormalities of gait and mobility: Secondary | ICD-10-CM | POA: Diagnosis not present

## 2016-10-28 DIAGNOSIS — Z9181 History of falling: Secondary | ICD-10-CM | POA: Diagnosis not present

## 2016-10-28 DIAGNOSIS — Z7982 Long term (current) use of aspirin: Secondary | ICD-10-CM | POA: Diagnosis not present

## 2016-10-28 DIAGNOSIS — R296 Repeated falls: Secondary | ICD-10-CM | POA: Diagnosis not present

## 2016-10-28 DIAGNOSIS — R269 Unspecified abnormalities of gait and mobility: Secondary | ICD-10-CM | POA: Diagnosis not present

## 2016-10-30 DIAGNOSIS — Z9181 History of falling: Secondary | ICD-10-CM | POA: Diagnosis not present

## 2016-10-30 DIAGNOSIS — R269 Unspecified abnormalities of gait and mobility: Secondary | ICD-10-CM | POA: Diagnosis not present

## 2016-10-30 DIAGNOSIS — Z7982 Long term (current) use of aspirin: Secondary | ICD-10-CM | POA: Diagnosis not present

## 2016-10-30 DIAGNOSIS — R296 Repeated falls: Secondary | ICD-10-CM | POA: Diagnosis not present

## 2016-11-02 DIAGNOSIS — L570 Actinic keratosis: Secondary | ICD-10-CM | POA: Diagnosis not present

## 2016-11-02 DIAGNOSIS — L720 Epidermal cyst: Secondary | ICD-10-CM | POA: Diagnosis not present

## 2016-11-02 DIAGNOSIS — Z124 Encounter for screening for malignant neoplasm of cervix: Secondary | ICD-10-CM | POA: Diagnosis not present

## 2016-11-02 DIAGNOSIS — L821 Other seborrheic keratosis: Secondary | ICD-10-CM | POA: Diagnosis not present

## 2016-11-02 DIAGNOSIS — Z85828 Personal history of other malignant neoplasm of skin: Secondary | ICD-10-CM | POA: Diagnosis not present

## 2016-11-03 DIAGNOSIS — Z9181 History of falling: Secondary | ICD-10-CM | POA: Diagnosis not present

## 2016-11-03 DIAGNOSIS — R269 Unspecified abnormalities of gait and mobility: Secondary | ICD-10-CM | POA: Diagnosis not present

## 2016-11-03 DIAGNOSIS — Z7982 Long term (current) use of aspirin: Secondary | ICD-10-CM | POA: Diagnosis not present

## 2016-11-03 DIAGNOSIS — R296 Repeated falls: Secondary | ICD-10-CM | POA: Diagnosis not present

## 2016-11-06 DIAGNOSIS — Z9181 History of falling: Secondary | ICD-10-CM | POA: Diagnosis not present

## 2016-11-06 DIAGNOSIS — Z7982 Long term (current) use of aspirin: Secondary | ICD-10-CM | POA: Diagnosis not present

## 2016-11-06 DIAGNOSIS — R296 Repeated falls: Secondary | ICD-10-CM | POA: Diagnosis not present

## 2016-11-06 DIAGNOSIS — R269 Unspecified abnormalities of gait and mobility: Secondary | ICD-10-CM | POA: Diagnosis not present

## 2016-11-09 DIAGNOSIS — Z7982 Long term (current) use of aspirin: Secondary | ICD-10-CM | POA: Diagnosis not present

## 2016-11-09 DIAGNOSIS — R296 Repeated falls: Secondary | ICD-10-CM | POA: Diagnosis not present

## 2016-11-09 DIAGNOSIS — Z9181 History of falling: Secondary | ICD-10-CM | POA: Diagnosis not present

## 2016-11-09 DIAGNOSIS — R269 Unspecified abnormalities of gait and mobility: Secondary | ICD-10-CM | POA: Diagnosis not present

## 2016-11-10 ENCOUNTER — Encounter: Payer: Self-pay | Admitting: Podiatry

## 2016-11-10 ENCOUNTER — Ambulatory Visit (INDEPENDENT_AMBULATORY_CARE_PROVIDER_SITE_OTHER): Payer: Medicare Other | Admitting: Podiatry

## 2016-11-10 ENCOUNTER — Ambulatory Visit: Payer: Medicare Other | Admitting: Podiatry

## 2016-11-10 DIAGNOSIS — B351 Tinea unguium: Secondary | ICD-10-CM | POA: Diagnosis not present

## 2016-11-10 DIAGNOSIS — M79676 Pain in unspecified toe(s): Secondary | ICD-10-CM

## 2016-11-10 NOTE — Progress Notes (Signed)
Patient ID: Darlene [name], female   DOB: [birth year], 82 y.o.   MRN: [id number] Complaint:  Visit Type: Patient returns to my office for continued preventative foot care services. Complaint: Patient states" my nails have grown long and thick and become painful to walk and wear shoes".  He presents for preventative foot care services. No changes to ROS  Podiatric Exam: Vascular: dorsalis pedis and posterior tibial pulses are palpable bilateral. Capillary return is immediate. Temperature gradient is WNL. Skin turgor WNL  Sensorium: Normal Semmes Weinstein monofilament test. Normal tactile sensation bilaterally. Nail Exam: Pt has thick disfigured discolored nails with subungual debris noted bilateral entire nail hallux through fifth toenails Ulcer Exam: There is no evidence of ulcer or pre-ulcerative changes or infection. Orthopedic Exam: Muscle tone and strength are WNL. No limitations in general ROM. No crepitus or effusions noted. Foot type and digits show no abnormalities. Bony prominences are unremarkable. Skin: No Porokeratosis. No infection or ulcers  Diagnosis:  Tinea unguium, Pain in right toe, pain in left toes  Treatment & Plan Procedures and Treatment: Consent by patient was obtained for treatment procedures. The patient understood the discussion of treatment and procedures well. All questions were answered thoroughly reviewed. Debridement of mycotic and hypertrophic toenails, 1 through 5 bilateral and clearing of subungual debris. No ulceration, no infection noted.  Return Visit-Office Procedure: Patient instructed to return to the office for a follow up visit 10 weeks  for continued evaluation and treatment.

## 2016-11-12 DIAGNOSIS — S01302A Unspecified open wound of left ear, initial encounter: Secondary | ICD-10-CM | POA: Diagnosis not present

## 2016-11-12 DIAGNOSIS — Z87891 Personal history of nicotine dependence: Secondary | ICD-10-CM | POA: Diagnosis not present

## 2016-11-12 DIAGNOSIS — H6122 Impacted cerumen, left ear: Secondary | ICD-10-CM | POA: Diagnosis not present

## 2016-11-12 DIAGNOSIS — H903 Sensorineural hearing loss, bilateral: Secondary | ICD-10-CM | POA: Diagnosis not present

## 2016-11-12 DIAGNOSIS — H6123 Impacted cerumen, bilateral: Secondary | ICD-10-CM | POA: Insufficient documentation

## 2016-11-12 DIAGNOSIS — Z974 Presence of external hearing-aid: Secondary | ICD-10-CM | POA: Diagnosis not present

## 2016-11-13 DIAGNOSIS — Z7982 Long term (current) use of aspirin: Secondary | ICD-10-CM | POA: Diagnosis not present

## 2016-11-13 DIAGNOSIS — R296 Repeated falls: Secondary | ICD-10-CM | POA: Diagnosis not present

## 2016-11-13 DIAGNOSIS — R269 Unspecified abnormalities of gait and mobility: Secondary | ICD-10-CM | POA: Diagnosis not present

## 2016-11-13 DIAGNOSIS — Z9181 History of falling: Secondary | ICD-10-CM | POA: Diagnosis not present

## 2016-11-16 DIAGNOSIS — Z7982 Long term (current) use of aspirin: Secondary | ICD-10-CM | POA: Diagnosis not present

## 2016-11-16 DIAGNOSIS — R269 Unspecified abnormalities of gait and mobility: Secondary | ICD-10-CM | POA: Diagnosis not present

## 2016-11-16 DIAGNOSIS — R296 Repeated falls: Secondary | ICD-10-CM | POA: Diagnosis not present

## 2016-11-16 DIAGNOSIS — Z9181 History of falling: Secondary | ICD-10-CM | POA: Diagnosis not present

## 2016-11-17 DIAGNOSIS — E039 Hypothyroidism, unspecified: Secondary | ICD-10-CM | POA: Diagnosis not present

## 2016-11-17 DIAGNOSIS — E559 Vitamin D deficiency, unspecified: Secondary | ICD-10-CM | POA: Diagnosis not present

## 2016-11-17 DIAGNOSIS — Z0000 Encounter for general adult medical examination without abnormal findings: Secondary | ICD-10-CM | POA: Diagnosis not present

## 2016-11-17 DIAGNOSIS — E785 Hyperlipidemia, unspecified: Secondary | ICD-10-CM | POA: Diagnosis not present

## 2016-11-18 DIAGNOSIS — R269 Unspecified abnormalities of gait and mobility: Secondary | ICD-10-CM | POA: Diagnosis not present

## 2016-11-18 DIAGNOSIS — Z7982 Long term (current) use of aspirin: Secondary | ICD-10-CM | POA: Diagnosis not present

## 2016-11-18 DIAGNOSIS — Z9181 History of falling: Secondary | ICD-10-CM | POA: Diagnosis not present

## 2016-11-18 DIAGNOSIS — R296 Repeated falls: Secondary | ICD-10-CM | POA: Diagnosis not present

## 2016-11-23 DIAGNOSIS — K219 Gastro-esophageal reflux disease without esophagitis: Secondary | ICD-10-CM | POA: Diagnosis not present

## 2016-11-23 DIAGNOSIS — I10 Essential (primary) hypertension: Secondary | ICD-10-CM | POA: Diagnosis not present

## 2016-11-23 DIAGNOSIS — E7800 Pure hypercholesterolemia, unspecified: Secondary | ICD-10-CM | POA: Diagnosis not present

## 2016-11-23 DIAGNOSIS — F329 Major depressive disorder, single episode, unspecified: Secondary | ICD-10-CM | POA: Diagnosis not present

## 2016-11-24 DIAGNOSIS — R296 Repeated falls: Secondary | ICD-10-CM | POA: Diagnosis not present

## 2016-11-24 DIAGNOSIS — Z7982 Long term (current) use of aspirin: Secondary | ICD-10-CM | POA: Diagnosis not present

## 2016-11-24 DIAGNOSIS — Z9181 History of falling: Secondary | ICD-10-CM | POA: Diagnosis not present

## 2016-11-24 DIAGNOSIS — R269 Unspecified abnormalities of gait and mobility: Secondary | ICD-10-CM | POA: Diagnosis not present

## 2016-11-27 DIAGNOSIS — R296 Repeated falls: Secondary | ICD-10-CM | POA: Diagnosis not present

## 2016-11-27 DIAGNOSIS — R269 Unspecified abnormalities of gait and mobility: Secondary | ICD-10-CM | POA: Diagnosis not present

## 2016-11-27 DIAGNOSIS — Z9181 History of falling: Secondary | ICD-10-CM | POA: Diagnosis not present

## 2016-11-27 DIAGNOSIS — Z7982 Long term (current) use of aspirin: Secondary | ICD-10-CM | POA: Diagnosis not present

## 2016-12-01 DIAGNOSIS — Z7982 Long term (current) use of aspirin: Secondary | ICD-10-CM | POA: Diagnosis not present

## 2016-12-01 DIAGNOSIS — R296 Repeated falls: Secondary | ICD-10-CM | POA: Diagnosis not present

## 2016-12-01 DIAGNOSIS — Z9181 History of falling: Secondary | ICD-10-CM | POA: Diagnosis not present

## 2016-12-01 DIAGNOSIS — R269 Unspecified abnormalities of gait and mobility: Secondary | ICD-10-CM | POA: Diagnosis not present

## 2016-12-14 DIAGNOSIS — L299 Pruritus, unspecified: Secondary | ICD-10-CM | POA: Insufficient documentation

## 2016-12-14 DIAGNOSIS — H903 Sensorineural hearing loss, bilateral: Secondary | ICD-10-CM | POA: Diagnosis not present

## 2017-01-07 DIAGNOSIS — W57XXXA Bitten or stung by nonvenomous insect and other nonvenomous arthropods, initial encounter: Secondary | ICD-10-CM | POA: Diagnosis not present

## 2017-01-07 DIAGNOSIS — S80869A Insect bite (nonvenomous), unspecified lower leg, initial encounter: Secondary | ICD-10-CM | POA: Diagnosis not present

## 2017-01-12 DIAGNOSIS — M810 Age-related osteoporosis without current pathological fracture: Secondary | ICD-10-CM | POA: Diagnosis not present

## 2017-01-29 ENCOUNTER — Ambulatory Visit (INDEPENDENT_AMBULATORY_CARE_PROVIDER_SITE_OTHER): Payer: Medicare Other | Admitting: Podiatry

## 2017-01-29 ENCOUNTER — Encounter: Payer: Self-pay | Admitting: Podiatry

## 2017-01-29 DIAGNOSIS — B351 Tinea unguium: Secondary | ICD-10-CM

## 2017-01-29 DIAGNOSIS — M79676 Pain in unspecified toe(s): Secondary | ICD-10-CM | POA: Diagnosis not present

## 2017-01-29 NOTE — Progress Notes (Signed)
Patient ID: Darlene [name], female   DOB: [birth year], 82 y.o.   MRN: [id number] Complaint:  Visit Type: Patient returns to my office for continued preventative foot care services. Complaint: Patient states" my nails have grown long and thick and become painful to walk and wear shoes".  He presents for preventative foot care services. No changes to ROS  Podiatric Exam: Vascular: dorsalis pedis and posterior tibial pulses are palpable bilateral. Capillary return is immediate. Temperature gradient is WNL. Skin turgor WNL  Sensorium: Normal Semmes Weinstein monofilament test. Normal tactile sensation bilaterally. Nail Exam: Pt has thick disfigured discolored nails with subungual debris noted bilateral entire nail hallux through fifth toenails Ulcer Exam: There is no evidence of ulcer or pre-ulcerative changes or infection. Orthopedic Exam: Muscle tone and strength are WNL. No limitations in general ROM. No crepitus or effusions noted. Foot type and digits show no abnormalities. Bony prominences are unremarkable. Asymptomatic adducto varus fifth digits. Skin: No Porokeratosis. No infection or ulcers.  Diagnosis:  Tinea unguium, Pain in right toe, pain in left toes  Treatment & Plan Procedures and Treatment: Consent by patient was obtained for treatment procedures. The patient understood the discussion of treatment and procedures well. All questions were answered thoroughly reviewed. Debridement of mycotic and hypertrophic toenails, 1 through 5 bilateral and clearing of subungual debris. No ulceration, no infection noted.  Return Visit-Office Procedure: Patient instructed to return to the office for a follow up visit 10 weeks  for continued evaluation and treatment.

## 2017-02-18 DIAGNOSIS — E785 Hyperlipidemia, unspecified: Secondary | ICD-10-CM | POA: Diagnosis not present

## 2017-02-18 DIAGNOSIS — Z780 Asymptomatic menopausal state: Secondary | ICD-10-CM | POA: Diagnosis not present

## 2017-02-18 DIAGNOSIS — M810 Age-related osteoporosis without current pathological fracture: Secondary | ICD-10-CM | POA: Diagnosis not present

## 2017-02-18 DIAGNOSIS — R2681 Unsteadiness on feet: Secondary | ICD-10-CM | POA: Diagnosis not present

## 2017-02-18 DIAGNOSIS — E039 Hypothyroidism, unspecified: Secondary | ICD-10-CM | POA: Diagnosis not present

## 2017-02-18 DIAGNOSIS — Z23 Encounter for immunization: Secondary | ICD-10-CM | POA: Diagnosis not present

## 2017-02-18 DIAGNOSIS — E559 Vitamin D deficiency, unspecified: Secondary | ICD-10-CM | POA: Diagnosis not present

## 2017-02-18 DIAGNOSIS — Z0000 Encounter for general adult medical examination without abnormal findings: Secondary | ICD-10-CM | POA: Diagnosis not present

## 2017-02-23 DIAGNOSIS — I10 Essential (primary) hypertension: Secondary | ICD-10-CM | POA: Diagnosis not present

## 2017-02-23 DIAGNOSIS — M810 Age-related osteoporosis without current pathological fracture: Secondary | ICD-10-CM | POA: Diagnosis not present

## 2017-02-23 DIAGNOSIS — J449 Chronic obstructive pulmonary disease, unspecified: Secondary | ICD-10-CM | POA: Diagnosis not present

## 2017-02-23 DIAGNOSIS — R2681 Unsteadiness on feet: Secondary | ICD-10-CM | POA: Diagnosis not present

## 2017-02-23 DIAGNOSIS — Z9181 History of falling: Secondary | ICD-10-CM | POA: Diagnosis not present

## 2017-02-23 DIAGNOSIS — F329 Major depressive disorder, single episode, unspecified: Secondary | ICD-10-CM | POA: Diagnosis not present

## 2017-02-24 DIAGNOSIS — R2681 Unsteadiness on feet: Secondary | ICD-10-CM | POA: Diagnosis not present

## 2017-02-24 DIAGNOSIS — M810 Age-related osteoporosis without current pathological fracture: Secondary | ICD-10-CM | POA: Diagnosis not present

## 2017-02-24 DIAGNOSIS — Z9181 History of falling: Secondary | ICD-10-CM | POA: Diagnosis not present

## 2017-02-24 DIAGNOSIS — I10 Essential (primary) hypertension: Secondary | ICD-10-CM | POA: Diagnosis not present

## 2017-02-24 DIAGNOSIS — F329 Major depressive disorder, single episode, unspecified: Secondary | ICD-10-CM | POA: Diagnosis not present

## 2017-02-24 DIAGNOSIS — J449 Chronic obstructive pulmonary disease, unspecified: Secondary | ICD-10-CM | POA: Diagnosis not present

## 2017-02-26 DIAGNOSIS — I10 Essential (primary) hypertension: Secondary | ICD-10-CM | POA: Diagnosis not present

## 2017-02-26 DIAGNOSIS — R2681 Unsteadiness on feet: Secondary | ICD-10-CM | POA: Diagnosis not present

## 2017-02-26 DIAGNOSIS — Z9181 History of falling: Secondary | ICD-10-CM | POA: Diagnosis not present

## 2017-02-26 DIAGNOSIS — F329 Major depressive disorder, single episode, unspecified: Secondary | ICD-10-CM | POA: Diagnosis not present

## 2017-02-26 DIAGNOSIS — J449 Chronic obstructive pulmonary disease, unspecified: Secondary | ICD-10-CM | POA: Diagnosis not present

## 2017-02-26 DIAGNOSIS — M810 Age-related osteoporosis without current pathological fracture: Secondary | ICD-10-CM | POA: Diagnosis not present

## 2017-03-01 DIAGNOSIS — J449 Chronic obstructive pulmonary disease, unspecified: Secondary | ICD-10-CM | POA: Diagnosis not present

## 2017-03-01 DIAGNOSIS — I10 Essential (primary) hypertension: Secondary | ICD-10-CM | POA: Diagnosis not present

## 2017-03-01 DIAGNOSIS — R2681 Unsteadiness on feet: Secondary | ICD-10-CM | POA: Diagnosis not present

## 2017-03-01 DIAGNOSIS — F329 Major depressive disorder, single episode, unspecified: Secondary | ICD-10-CM | POA: Diagnosis not present

## 2017-03-01 DIAGNOSIS — Z9181 History of falling: Secondary | ICD-10-CM | POA: Diagnosis not present

## 2017-03-01 DIAGNOSIS — M810 Age-related osteoporosis without current pathological fracture: Secondary | ICD-10-CM | POA: Diagnosis not present

## 2017-03-03 DIAGNOSIS — M810 Age-related osteoporosis without current pathological fracture: Secondary | ICD-10-CM | POA: Diagnosis not present

## 2017-03-03 DIAGNOSIS — R2681 Unsteadiness on feet: Secondary | ICD-10-CM | POA: Diagnosis not present

## 2017-03-03 DIAGNOSIS — Z9181 History of falling: Secondary | ICD-10-CM | POA: Diagnosis not present

## 2017-03-03 DIAGNOSIS — F329 Major depressive disorder, single episode, unspecified: Secondary | ICD-10-CM | POA: Diagnosis not present

## 2017-03-03 DIAGNOSIS — I10 Essential (primary) hypertension: Secondary | ICD-10-CM | POA: Diagnosis not present

## 2017-03-03 DIAGNOSIS — J449 Chronic obstructive pulmonary disease, unspecified: Secondary | ICD-10-CM | POA: Diagnosis not present

## 2017-03-06 DIAGNOSIS — J449 Chronic obstructive pulmonary disease, unspecified: Secondary | ICD-10-CM | POA: Diagnosis not present

## 2017-03-06 DIAGNOSIS — I10 Essential (primary) hypertension: Secondary | ICD-10-CM | POA: Diagnosis not present

## 2017-03-06 DIAGNOSIS — M810 Age-related osteoporosis without current pathological fracture: Secondary | ICD-10-CM | POA: Diagnosis not present

## 2017-03-06 DIAGNOSIS — R2681 Unsteadiness on feet: Secondary | ICD-10-CM | POA: Diagnosis not present

## 2017-03-06 DIAGNOSIS — Z9181 History of falling: Secondary | ICD-10-CM | POA: Diagnosis not present

## 2017-03-06 DIAGNOSIS — F329 Major depressive disorder, single episode, unspecified: Secondary | ICD-10-CM | POA: Diagnosis not present

## 2017-03-08 DIAGNOSIS — F329 Major depressive disorder, single episode, unspecified: Secondary | ICD-10-CM | POA: Diagnosis not present

## 2017-03-08 DIAGNOSIS — Z9181 History of falling: Secondary | ICD-10-CM | POA: Diagnosis not present

## 2017-03-08 DIAGNOSIS — M810 Age-related osteoporosis without current pathological fracture: Secondary | ICD-10-CM | POA: Diagnosis not present

## 2017-03-08 DIAGNOSIS — J449 Chronic obstructive pulmonary disease, unspecified: Secondary | ICD-10-CM | POA: Diagnosis not present

## 2017-03-08 DIAGNOSIS — R2681 Unsteadiness on feet: Secondary | ICD-10-CM | POA: Diagnosis not present

## 2017-03-08 DIAGNOSIS — I10 Essential (primary) hypertension: Secondary | ICD-10-CM | POA: Diagnosis not present

## 2017-03-10 DIAGNOSIS — I10 Essential (primary) hypertension: Secondary | ICD-10-CM | POA: Diagnosis not present

## 2017-03-10 DIAGNOSIS — M810 Age-related osteoporosis without current pathological fracture: Secondary | ICD-10-CM | POA: Diagnosis not present

## 2017-03-10 DIAGNOSIS — R2681 Unsteadiness on feet: Secondary | ICD-10-CM | POA: Diagnosis not present

## 2017-03-10 DIAGNOSIS — J449 Chronic obstructive pulmonary disease, unspecified: Secondary | ICD-10-CM | POA: Diagnosis not present

## 2017-03-10 DIAGNOSIS — F329 Major depressive disorder, single episode, unspecified: Secondary | ICD-10-CM | POA: Diagnosis not present

## 2017-03-10 DIAGNOSIS — Z9181 History of falling: Secondary | ICD-10-CM | POA: Diagnosis not present

## 2017-03-12 DIAGNOSIS — F329 Major depressive disorder, single episode, unspecified: Secondary | ICD-10-CM | POA: Diagnosis not present

## 2017-03-12 DIAGNOSIS — M810 Age-related osteoporosis without current pathological fracture: Secondary | ICD-10-CM | POA: Diagnosis not present

## 2017-03-12 DIAGNOSIS — R2681 Unsteadiness on feet: Secondary | ICD-10-CM | POA: Diagnosis not present

## 2017-03-12 DIAGNOSIS — J449 Chronic obstructive pulmonary disease, unspecified: Secondary | ICD-10-CM | POA: Diagnosis not present

## 2017-03-12 DIAGNOSIS — I10 Essential (primary) hypertension: Secondary | ICD-10-CM | POA: Diagnosis not present

## 2017-03-12 DIAGNOSIS — Z9181 History of falling: Secondary | ICD-10-CM | POA: Diagnosis not present

## 2017-03-15 DIAGNOSIS — R2681 Unsteadiness on feet: Secondary | ICD-10-CM | POA: Diagnosis not present

## 2017-03-15 DIAGNOSIS — M810 Age-related osteoporosis without current pathological fracture: Secondary | ICD-10-CM | POA: Diagnosis not present

## 2017-03-15 DIAGNOSIS — I10 Essential (primary) hypertension: Secondary | ICD-10-CM | POA: Diagnosis not present

## 2017-03-15 DIAGNOSIS — F329 Major depressive disorder, single episode, unspecified: Secondary | ICD-10-CM | POA: Diagnosis not present

## 2017-03-15 DIAGNOSIS — Z9181 History of falling: Secondary | ICD-10-CM | POA: Diagnosis not present

## 2017-03-15 DIAGNOSIS — J449 Chronic obstructive pulmonary disease, unspecified: Secondary | ICD-10-CM | POA: Diagnosis not present

## 2017-03-17 DIAGNOSIS — F329 Major depressive disorder, single episode, unspecified: Secondary | ICD-10-CM | POA: Diagnosis not present

## 2017-03-17 DIAGNOSIS — M810 Age-related osteoporosis without current pathological fracture: Secondary | ICD-10-CM | POA: Diagnosis not present

## 2017-03-17 DIAGNOSIS — J449 Chronic obstructive pulmonary disease, unspecified: Secondary | ICD-10-CM | POA: Diagnosis not present

## 2017-03-17 DIAGNOSIS — R2681 Unsteadiness on feet: Secondary | ICD-10-CM | POA: Diagnosis not present

## 2017-03-17 DIAGNOSIS — I10 Essential (primary) hypertension: Secondary | ICD-10-CM | POA: Diagnosis not present

## 2017-03-17 DIAGNOSIS — Z9181 History of falling: Secondary | ICD-10-CM | POA: Diagnosis not present

## 2017-03-18 DIAGNOSIS — J449 Chronic obstructive pulmonary disease, unspecified: Secondary | ICD-10-CM | POA: Diagnosis not present

## 2017-03-18 DIAGNOSIS — F329 Major depressive disorder, single episode, unspecified: Secondary | ICD-10-CM | POA: Diagnosis not present

## 2017-03-18 DIAGNOSIS — M810 Age-related osteoporosis without current pathological fracture: Secondary | ICD-10-CM | POA: Diagnosis not present

## 2017-03-18 DIAGNOSIS — I10 Essential (primary) hypertension: Secondary | ICD-10-CM | POA: Diagnosis not present

## 2017-03-18 DIAGNOSIS — R2681 Unsteadiness on feet: Secondary | ICD-10-CM | POA: Diagnosis not present

## 2017-03-18 DIAGNOSIS — Z9181 History of falling: Secondary | ICD-10-CM | POA: Diagnosis not present

## 2017-03-22 DIAGNOSIS — J449 Chronic obstructive pulmonary disease, unspecified: Secondary | ICD-10-CM | POA: Diagnosis not present

## 2017-03-22 DIAGNOSIS — Z9181 History of falling: Secondary | ICD-10-CM | POA: Diagnosis not present

## 2017-03-22 DIAGNOSIS — F329 Major depressive disorder, single episode, unspecified: Secondary | ICD-10-CM | POA: Diagnosis not present

## 2017-03-22 DIAGNOSIS — I10 Essential (primary) hypertension: Secondary | ICD-10-CM | POA: Diagnosis not present

## 2017-03-22 DIAGNOSIS — R2681 Unsteadiness on feet: Secondary | ICD-10-CM | POA: Diagnosis not present

## 2017-03-22 DIAGNOSIS — M810 Age-related osteoporosis without current pathological fracture: Secondary | ICD-10-CM | POA: Diagnosis not present

## 2017-03-24 DIAGNOSIS — I10 Essential (primary) hypertension: Secondary | ICD-10-CM | POA: Diagnosis not present

## 2017-03-24 DIAGNOSIS — J449 Chronic obstructive pulmonary disease, unspecified: Secondary | ICD-10-CM | POA: Diagnosis not present

## 2017-03-24 DIAGNOSIS — Z9181 History of falling: Secondary | ICD-10-CM | POA: Diagnosis not present

## 2017-03-24 DIAGNOSIS — F329 Major depressive disorder, single episode, unspecified: Secondary | ICD-10-CM | POA: Diagnosis not present

## 2017-03-24 DIAGNOSIS — M810 Age-related osteoporosis without current pathological fracture: Secondary | ICD-10-CM | POA: Diagnosis not present

## 2017-03-24 DIAGNOSIS — R2681 Unsteadiness on feet: Secondary | ICD-10-CM | POA: Diagnosis not present

## 2017-03-29 DIAGNOSIS — Z9181 History of falling: Secondary | ICD-10-CM | POA: Diagnosis not present

## 2017-03-29 DIAGNOSIS — R2681 Unsteadiness on feet: Secondary | ICD-10-CM | POA: Diagnosis not present

## 2017-03-29 DIAGNOSIS — F329 Major depressive disorder, single episode, unspecified: Secondary | ICD-10-CM | POA: Diagnosis not present

## 2017-03-29 DIAGNOSIS — J449 Chronic obstructive pulmonary disease, unspecified: Secondary | ICD-10-CM | POA: Diagnosis not present

## 2017-03-29 DIAGNOSIS — I10 Essential (primary) hypertension: Secondary | ICD-10-CM | POA: Diagnosis not present

## 2017-03-29 DIAGNOSIS — M810 Age-related osteoporosis without current pathological fracture: Secondary | ICD-10-CM | POA: Diagnosis not present

## 2017-04-01 DIAGNOSIS — M810 Age-related osteoporosis without current pathological fracture: Secondary | ICD-10-CM | POA: Diagnosis not present

## 2017-04-01 DIAGNOSIS — J449 Chronic obstructive pulmonary disease, unspecified: Secondary | ICD-10-CM | POA: Diagnosis not present

## 2017-04-01 DIAGNOSIS — I10 Essential (primary) hypertension: Secondary | ICD-10-CM | POA: Diagnosis not present

## 2017-04-01 DIAGNOSIS — F329 Major depressive disorder, single episode, unspecified: Secondary | ICD-10-CM | POA: Diagnosis not present

## 2017-04-01 DIAGNOSIS — R2681 Unsteadiness on feet: Secondary | ICD-10-CM | POA: Diagnosis not present

## 2017-04-01 DIAGNOSIS — Z9181 History of falling: Secondary | ICD-10-CM | POA: Diagnosis not present

## 2017-04-05 DIAGNOSIS — F329 Major depressive disorder, single episode, unspecified: Secondary | ICD-10-CM | POA: Diagnosis not present

## 2017-04-05 DIAGNOSIS — Z9181 History of falling: Secondary | ICD-10-CM | POA: Diagnosis not present

## 2017-04-05 DIAGNOSIS — M810 Age-related osteoporosis without current pathological fracture: Secondary | ICD-10-CM | POA: Diagnosis not present

## 2017-04-05 DIAGNOSIS — J449 Chronic obstructive pulmonary disease, unspecified: Secondary | ICD-10-CM | POA: Diagnosis not present

## 2017-04-05 DIAGNOSIS — R2681 Unsteadiness on feet: Secondary | ICD-10-CM | POA: Diagnosis not present

## 2017-04-05 DIAGNOSIS — I10 Essential (primary) hypertension: Secondary | ICD-10-CM | POA: Diagnosis not present

## 2017-04-06 DIAGNOSIS — Z96651 Presence of right artificial knee joint: Secondary | ICD-10-CM | POA: Diagnosis not present

## 2017-04-07 DIAGNOSIS — Z9181 History of falling: Secondary | ICD-10-CM | POA: Diagnosis not present

## 2017-04-07 DIAGNOSIS — I10 Essential (primary) hypertension: Secondary | ICD-10-CM | POA: Diagnosis not present

## 2017-04-07 DIAGNOSIS — J449 Chronic obstructive pulmonary disease, unspecified: Secondary | ICD-10-CM | POA: Diagnosis not present

## 2017-04-07 DIAGNOSIS — R2681 Unsteadiness on feet: Secondary | ICD-10-CM | POA: Diagnosis not present

## 2017-04-07 DIAGNOSIS — F329 Major depressive disorder, single episode, unspecified: Secondary | ICD-10-CM | POA: Diagnosis not present

## 2017-04-07 DIAGNOSIS — M810 Age-related osteoporosis without current pathological fracture: Secondary | ICD-10-CM | POA: Diagnosis not present

## 2017-04-08 ENCOUNTER — Encounter: Payer: Self-pay | Admitting: Neurology

## 2017-04-16 ENCOUNTER — Ambulatory Visit: Payer: Medicare Other | Admitting: Podiatry

## 2017-04-20 ENCOUNTER — Other Ambulatory Visit (HOSPITAL_COMMUNITY): Payer: Self-pay | Admitting: Interventional Radiology

## 2017-04-20 DIAGNOSIS — I83813 Varicose veins of bilateral lower extremities with pain: Secondary | ICD-10-CM

## 2017-05-12 DIAGNOSIS — M25561 Pain in right knee: Secondary | ICD-10-CM | POA: Diagnosis not present

## 2017-05-12 DIAGNOSIS — R278 Other lack of coordination: Secondary | ICD-10-CM | POA: Diagnosis not present

## 2017-05-12 DIAGNOSIS — M6281 Muscle weakness (generalized): Secondary | ICD-10-CM | POA: Diagnosis not present

## 2017-05-12 DIAGNOSIS — R262 Difficulty in walking, not elsewhere classified: Secondary | ICD-10-CM | POA: Diagnosis not present

## 2017-05-13 DIAGNOSIS — M6281 Muscle weakness (generalized): Secondary | ICD-10-CM | POA: Diagnosis not present

## 2017-05-13 DIAGNOSIS — R262 Difficulty in walking, not elsewhere classified: Secondary | ICD-10-CM | POA: Diagnosis not present

## 2017-05-13 DIAGNOSIS — M25561 Pain in right knee: Secondary | ICD-10-CM | POA: Diagnosis not present

## 2017-05-13 DIAGNOSIS — R278 Other lack of coordination: Secondary | ICD-10-CM | POA: Diagnosis not present

## 2017-05-14 ENCOUNTER — Ambulatory Visit (INDEPENDENT_AMBULATORY_CARE_PROVIDER_SITE_OTHER): Payer: Medicare Other | Admitting: Podiatry

## 2017-05-14 ENCOUNTER — Encounter: Payer: Self-pay | Admitting: Podiatry

## 2017-05-14 DIAGNOSIS — M79676 Pain in unspecified toe(s): Secondary | ICD-10-CM

## 2017-05-14 DIAGNOSIS — B351 Tinea unguium: Secondary | ICD-10-CM | POA: Diagnosis not present

## 2017-05-14 NOTE — Progress Notes (Signed)
Patient ID: Darlene [name], female   DOB: 22-Mar-1935, 83 y.o.   MRN: [id number] Complaint:  Visit Type: Patient returns to my office for continued preventative foot care services. Complaint: Patient states" my nails have grown long and thick and become painful to walk and wear shoes".  He presents for preventative foot care services. No changes to ROS  Podiatric Exam: Vascular: dorsalis pedis and posterior tibial pulses are palpable bilateral. Capillary return is immediate. Temperature gradient is WNL. Skin turgor WNL  Sensorium: Normal Semmes Weinstein monofilament test. Normal tactile sensation bilaterally. Nail Exam: Pt has thick disfigured discolored nails with subungual debris noted bilateral entire nail hallux through fifth toenails Ulcer Exam: There is no evidence of ulcer or pre-ulcerative changes or infection. Orthopedic Exam: Muscle tone and strength are WNL. No limitations in general ROM. No crepitus or effusions noted. Foot type and digits show no abnormalities. Bony prominences are unremarkable. Asymptomatic adducto varus fifth digits. Skin: No Porokeratosis. No infection or ulcers.  Diagnosis:  Tinea unguium, Pain in right toe, pain in left toes  Treatment & Plan Procedures and Treatment: Consent by patient was obtained for treatment procedures. The patient understood the discussion of treatment and procedures well. All questions were answered thoroughly reviewed. Debridement of mycotic and hypertrophic toenails, 1 through 5 bilateral and clearing of subungual debris. No ulceration, no infection noted.  Return Visit-Office Procedure: Patient instructed to return to the office for a follow up visit 10 weeks  for continued evaluation and treatment.

## 2017-05-18 DIAGNOSIS — M6281 Muscle weakness (generalized): Secondary | ICD-10-CM | POA: Diagnosis not present

## 2017-05-18 DIAGNOSIS — R262 Difficulty in walking, not elsewhere classified: Secondary | ICD-10-CM | POA: Diagnosis not present

## 2017-05-18 DIAGNOSIS — M25561 Pain in right knee: Secondary | ICD-10-CM | POA: Diagnosis not present

## 2017-05-18 DIAGNOSIS — R278 Other lack of coordination: Secondary | ICD-10-CM | POA: Diagnosis not present

## 2017-05-20 ENCOUNTER — Ambulatory Visit
Admission: RE | Admit: 2017-05-20 | Discharge: 2017-05-20 | Disposition: A | Discharge status: Home / Self Care | Payer: Medicare Other | Source: Ambulatory Visit | Attending: Interventional Radiology | Admitting: Interventional Radiology

## 2017-05-20 DIAGNOSIS — I83813 Varicose veins of bilateral lower extremities with pain: Secondary | ICD-10-CM

## 2017-05-20 DIAGNOSIS — M79661 Pain in right lower leg: Secondary | ICD-10-CM | POA: Diagnosis not present

## 2017-05-20 DIAGNOSIS — M79662 Pain in left lower leg: Secondary | ICD-10-CM | POA: Diagnosis not present

## 2017-05-20 NOTE — Progress Notes (Signed)
Patient ID: MYRISSA CHIPLEY, female   DOB: 19-Apr-1935, 83 y.o.   MRN: [id number]       Chief Complaint: Patient was seen in consultation today for  Chief Complaint  Patient presents with  . Advice Only    E & M of Varicose Veins     at the request of Tailer Volkert  Referring Physician(s): Justus Droke  History of Present Illness: NAIMA VELDHUIZEN is a 83 y.o. female well-known to her service from previous treatment of bilateral lower extremity venous valvular incompetence and reflux, with endovenous laser occlusion of great saphenous veins bilaterally in July 2013. She did well post procedure. Follow-up ultrasound through 12/19/2014 have demonstrated durable closure of the treated segments. She presents with new lower extremity heaviness and pain. No visible varicose veins. No venous ulcerations.  Past Medical History:  Diagnosis Date  . Anxiety   . Breast cancer, stage 1, estrogen receptor negative ([place]) 07/06/2011  . Cancer of breast ([place])   . Cataract   . Chronic ITP (idiopathic thrombocytopenia) (HCC) 07/06/2011  . Constipation    takes Colace daily  . COPD (chronic obstructive pulmonary disease) ([facility])    mild  . Depression    "nervous break down age 31"  . GERD (gastroesophageal reflux disease)    takes Nexium daily  . History of colon polyps   . History of depression    received shock treatments   . History of palpitations    takes Metoprolol daily  . History of vertigo   . HOH (hard of hearing)    has hearing aids  . Hyperlipidemia    takes Lovastatin daily  . Hypothyroid 07/06/2011   takes SYnthroid daily  . Joint pain   . Joint swelling   . Lymphoma ([place])   . Lymphoma ([place])    B cell   . Lymphoma of lymph nodes of head, face, and/or neck ([place]) 07/06/2011  . Neuropathy   . Nocturia   . Osteopenia   . Osteoporosis    hx of and take Reclast  . Peripheral neuropathy   . Personal history of chemotherapy   . Personal history of radiation therapy   . Right knee  DJD   . Shortness of breath dyspnea    with exertion  . Thyroid disease   . Umbilical hernia     Past Surgical History:  Procedure Laterality Date  . ADENOIDECTOMY     as a child  . BREAST LUMPECTOMY Right 1994  . BREAST SURGERY Right   . BUNIONECTOMY Right   . CARDIAC CATHETERIZATION  2014  . COLONOSCOPY    . FOOT SURGERY Right   . INSERTION OF MESH N/A 04/17/2015   Procedure: INSERTION OF MESH;  Surgeon: Ralene Ok, MD;  Location: [facility];  Service: General;  Laterality: N/A;  . KNEE SURGERY    . LEFT HEART CATHETERIZATION WITH CORONARY ANGIOGRAM N/A 12/05/2012   Procedure: LEFT HEART CATHETERIZATION WITH CORONARY ANGIOGRAM;  Surgeon: [name] Sine, MD;  Location: [facility] CATH LAB;  Service: Cardiovascular;  Laterality: N/A;  . right  breast surgery     d/t breast cancer  . TONSILLECTOMY     as a child  . TOTAL KNEE ARTHROPLASTY Right 06/19/2013   Procedure: TOTAL KNEE ARTHROPLASTY;  Surgeon: Lorn Junes, MD;  Location: [facility];  Service: Orthopedics;  Laterality: Right;  . TURBINATE REDUCTION    . UMBILICAL HERNIA REPAIR N/A 04/17/2015   Procedure: LAPAROSCOPIC UMBILICAL HERNIA REPAIR WITH MESH;  Surgeon: Ralene Ok,  MD;  Location: [facility];  Service: General;  Laterality: N/A;  . UPPER GASTROINTESTINAL ENDOSCOPY      Allergies: Oxycodone; Codeine; Doxycycline; Meperidine and related; Sulfa antibiotics; and Sulfacetamide sodium  Medications: Prior to Admission medications   Medication Sig Start Date End Date Taking? Authorizing Provider  acetaminophen (TYLENOL) 500 MG tablet Take 1 tablet (500 mg total) by mouth every 6 (six) hours as needed for mild pain. 04/19/16   Quintella Reichert, MD  aspirin 81 MG tablet Take 81 mg by mouth daily.    [provider]  cholecalciferol (VITAMIN D) 1000 UNITS tablet Take 1,000 Units by mouth daily.    [provider]  citalopram (CELEXA) 40 MG tablet Take 40 mg by mouth daily.    [provider]    Cyanocobalamin (VITAMIN B-12 PO) Take 1,000 mcg by mouth daily.    [provider]  esomeprazole (NEXIUM) 40 MG capsule Take 40 mg by mouth 2 (two) times daily before a meal.     [provider]  famciclovir (FAMVIR) 125 MG tablet Take 250 mg by mouth 2 (two) times daily as needed. For 5 days as needed for outbreak    [provider]  L-Methylfolate-Algae-B12-B6 Glade Stanford) 3-90.314-2-35 MG CAPS Take 1 tablet by mouth 2 (two) times daily. 03/20/15   [provider]  levothyroxine (SYNTHROID, LEVOTHROID) 75 MCG tablet Take 75 mcg by mouth daily.      [provider]  lovastatin (MEVACOR) 20 MG tablet Take 20 mg by mouth at bedtime.      [provider]  LYRICA 50 MG capsule Take 50 mg by mouth 2 (two) times daily.  12/25/14   [provider]  metoprolol succinate (TOPROL-XL) 25 MG 24 hr tablet Take 25 mg by mouth daily.  11/22/12   [provider]  Multiple Vitamins-Minerals (MULTIVITAMINS THER. W/MINERALS) TABS Take 1 tablet by mouth daily.      [provider]  Polyethyl Glycol-Propyl Glycol (SYSTANE) 0.4-0.3 % SOLN Apply 2 drops to eye 2 (two) times daily as needed (for dry eyes).    [provider]  Vitamin D, Ergocalciferol, (DRISDOL) 50000 UNITS CAPS Take 50,000 Units by mouth every 14 (fourteen) days.      [provider]     Family History  Problem Relation Age of Onset  . Hyperlipidemia Father   . Heart disease Father   . Heart disease Sister   . Cancer Brother   . Diabetes Son   . Heart disease Maternal Grandfather   . Colon cancer Neg Hx   . Breast cancer Neg Hx     Social History   Socioeconomic History  . Marital status: Widowed    Spouse name: Not on file  . Number of children: 2  . Years of education: HS  . Highest education level: Not on file  Social Needs  . Financial resource strain: Not on file  . Food insecurity - worry: Not on file  . Food insecurity - inability: Not  on file  . Transportation needs - medical: Not on file  . Transportation needs - non-medical: Not on file  Occupational History  . Occupation: Retired  Tobacco Use  . Smoking status: Former Smoker    Packs/day: 0.30    Types: Cigarettes  . Smokeless tobacco: Never Used  . Tobacco comment: quit smoking 1985  Substance and Sexual Activity  . Alcohol use: No    Alcohol/week: 0.0 oz  . Drug use: No  . Sexual  activity: Yes    Birth control/protection: Post-menopausal  Other Topics Concern  . Not on file  Social History Narrative   Lives at home alone.   Right-handed.   4 cups of caffeine daily.    ECOG Status: 1 - Symptomatic but completely ambulatory  Review of Systems: A 12 point ROS discussed and pertinent positives are indicated in the HPI above.  All other systems are negative.  Review of Systems  Vital Signs: BP 130/77   Pulse 70   Temp 98.1 F (36.7 C) (Oral)   Resp 14   Ht 5\' 2"  (1.575 m)   Wt 168 lb (76.2 kg)   SpO2 94%   BMI 30.73 kg/m   Physical Exam Constitutional: Oriented to person, place, and time. Well-developed and well-nourished. No distress.  HENT:  Head: Normocephalic and atraumatic.  Eyes: Conjunctivae and EOM are normal. Right eye exhibits no discharge. Left eye exhibits no discharge. No scleral icterus.  Neck: No JVD present.  Pulmonary/Chest: Effort normal. No stridor. No respiratory distress.  Abdomen: Obese, soft, non distended Neurological:  alert and oriented to person, place, and time.  Skin: Skin is warm and dry.  not diaphoretic.  Psychiatric:   normal mood and affect.   behavior is normal. Judgment and thought content normal.   Mallampati Score:     Imaging: BILATERAL LOWER EXTREMITY VENOUS DUPLEX ULTRASOUND  TECHNIQUE: Gray-scale sonography with graded compression, as well as color Doppler and duplex ultrasound, were performed to evaluate the deep and superficial veins of both lower extremities. Spectral Doppler was  utilized to evaluate flow at rest and with distal augmentation maneuvers. A complete superficial venous insufficiency exam was performed in the upright standing position. I personally performed the technical portion of the exam.  COMPARISON: 12/19/2014  FINDINGS: Deep Venous System:  Evaluation of the deep venous system including the common femoral, femoral, profunda femoral, popliteal and calf veins (where visible) demonstrate no evidence of deep venous thrombosis. The vessels are compressible and demonstrate normal respiratory phasicity and response to augmentation. No evidence of the deep venous reflux.  Incidental note made of a 4.3 cm left posterior popliteal cyst.  Superficial Venous System:  RIGHT  SFJ: Patent  The right GSV occluded inferiorly.  The short saphenous vein is diminutive  LEFT  SFJ: Patent  The left short great saphenous vein is occluded inferiorly.  The short saphenous vein is diminutive less than 1 mm.  Other: None  IMPRESSION: 1. Normal bilateral lower extremity deep venous systems; no DVT. 2. Durable closure of bilateral great saphenous veins. 3. No evidence of residual or recurrent lower extremity saphenous venous valvular incompetence or reflux. 4. Left Baker's cyst.   Electronically Signed By: Lucrezia Europe M.D. On: 05/20/2017 15:48     Labs:  CBC: Recent Labs    07/07/16 1035  WBC 6.3  HGB 11.6  HCT 35.6  PLT 122*    COAGS: No results for input(s): INR, APTT in the last 8760 hours.  BMP: Recent Labs    07/07/16 1035  NA 135*  K 4.8  CO2 24  GLUCOSE 85  BUN 19.9  CALCIUM 10.2  CREATININE 0.7    LIVER FUNCTION TESTS: Recent Labs    07/07/16 1035  BILITOT 0.61  AST 14  ALT 17  ALKPHOS 58  PROT 6.6  ALBUMIN 3.9    TUMOR MARKERS: No results for input(s): AFPTM, CEA, CA199, CHROMGRNA in the last 8760 hours.  Assessment and Plan:  My impression is that the patient's  lower extremity symptoms are unlikely  to be related to venous disease. Ultrasound shows durable closure of the treated saphenous vein segments. There is no evidence of saphenous venous valvular incompetence or reflux. No varicose veins are identified. The deep venous systems are normal. The findings were reviewed with the patient and she was reassured. She will follow-up with her PCP for further evaluation. I'm happy to see her back on a when necessary basis.   Thank you for this interesting consult.  I greatly enjoyed seeing LYNNE TAKEMOTO and look forward to participating in their care.  A copy of this report was sent to the requesting provider on this date.  Electronically Signed: Rickard Rhymes 05/20/2017, 3:49 PM   I spent a total of    25 Minutes in face to face in clinical consultation, greater than 50% of which was counseling/coordinating care for bilateral lower extremity pain.

## 2017-05-21 DIAGNOSIS — R278 Other lack of coordination: Secondary | ICD-10-CM | POA: Diagnosis not present

## 2017-05-21 DIAGNOSIS — M25561 Pain in right knee: Secondary | ICD-10-CM | POA: Diagnosis not present

## 2017-05-21 DIAGNOSIS — R262 Difficulty in walking, not elsewhere classified: Secondary | ICD-10-CM | POA: Diagnosis not present

## 2017-05-21 DIAGNOSIS — M6281 Muscle weakness (generalized): Secondary | ICD-10-CM | POA: Diagnosis not present

## 2017-05-24 DIAGNOSIS — R278 Other lack of coordination: Secondary | ICD-10-CM | POA: Diagnosis not present

## 2017-05-24 DIAGNOSIS — M6281 Muscle weakness (generalized): Secondary | ICD-10-CM | POA: Diagnosis not present

## 2017-05-24 DIAGNOSIS — M25561 Pain in right knee: Secondary | ICD-10-CM | POA: Diagnosis not present

## 2017-05-24 DIAGNOSIS — R262 Difficulty in walking, not elsewhere classified: Secondary | ICD-10-CM | POA: Diagnosis not present

## 2017-05-25 DIAGNOSIS — E7800 Pure hypercholesterolemia, unspecified: Secondary | ICD-10-CM | POA: Diagnosis not present

## 2017-05-28 DIAGNOSIS — R262 Difficulty in walking, not elsewhere classified: Secondary | ICD-10-CM | POA: Diagnosis not present

## 2017-05-28 DIAGNOSIS — M25562 Pain in left knee: Secondary | ICD-10-CM | POA: Diagnosis not present

## 2017-05-28 DIAGNOSIS — R278 Other lack of coordination: Secondary | ICD-10-CM | POA: Diagnosis not present

## 2017-05-28 DIAGNOSIS — M6281 Muscle weakness (generalized): Secondary | ICD-10-CM | POA: Diagnosis not present

## 2017-05-28 DIAGNOSIS — M25561 Pain in right knee: Secondary | ICD-10-CM | POA: Diagnosis not present

## 2017-05-31 DIAGNOSIS — R262 Difficulty in walking, not elsewhere classified: Secondary | ICD-10-CM | POA: Diagnosis not present

## 2017-05-31 DIAGNOSIS — M25562 Pain in left knee: Secondary | ICD-10-CM | POA: Diagnosis not present

## 2017-05-31 DIAGNOSIS — M25561 Pain in right knee: Secondary | ICD-10-CM | POA: Diagnosis not present

## 2017-05-31 DIAGNOSIS — M6281 Muscle weakness (generalized): Secondary | ICD-10-CM | POA: Diagnosis not present

## 2017-05-31 DIAGNOSIS — R278 Other lack of coordination: Secondary | ICD-10-CM | POA: Diagnosis not present

## 2017-06-01 DIAGNOSIS — F329 Major depressive disorder, single episode, unspecified: Secondary | ICD-10-CM | POA: Diagnosis not present

## 2017-06-01 DIAGNOSIS — E7800 Pure hypercholesterolemia, unspecified: Secondary | ICD-10-CM | POA: Diagnosis not present

## 2017-06-01 DIAGNOSIS — I10 Essential (primary) hypertension: Secondary | ICD-10-CM | POA: Diagnosis not present

## 2017-06-01 DIAGNOSIS — E039 Hypothyroidism, unspecified: Secondary | ICD-10-CM | POA: Diagnosis not present

## 2017-06-01 DIAGNOSIS — K219 Gastro-esophageal reflux disease without esophagitis: Secondary | ICD-10-CM | POA: Diagnosis not present

## 2017-06-04 DIAGNOSIS — R278 Other lack of coordination: Secondary | ICD-10-CM | POA: Diagnosis not present

## 2017-06-04 DIAGNOSIS — M6281 Muscle weakness (generalized): Secondary | ICD-10-CM | POA: Diagnosis not present

## 2017-06-04 DIAGNOSIS — R262 Difficulty in walking, not elsewhere classified: Secondary | ICD-10-CM | POA: Diagnosis not present

## 2017-06-04 DIAGNOSIS — M25561 Pain in right knee: Secondary | ICD-10-CM | POA: Diagnosis not present

## 2017-06-07 DIAGNOSIS — G629 Polyneuropathy, unspecified: Secondary | ICD-10-CM | POA: Diagnosis not present

## 2017-06-07 DIAGNOSIS — F339 Major depressive disorder, recurrent, unspecified: Secondary | ICD-10-CM | POA: Diagnosis not present

## 2017-06-08 DIAGNOSIS — M7502 Adhesive capsulitis of left shoulder: Secondary | ICD-10-CM | POA: Diagnosis not present

## 2017-06-08 DIAGNOSIS — R531 Weakness: Secondary | ICD-10-CM | POA: Diagnosis not present

## 2017-06-08 DIAGNOSIS — M6281 Muscle weakness (generalized): Secondary | ICD-10-CM | POA: Diagnosis not present

## 2017-06-08 DIAGNOSIS — R262 Difficulty in walking, not elsewhere classified: Secondary | ICD-10-CM | POA: Diagnosis not present

## 2017-06-10 DIAGNOSIS — R278 Other lack of coordination: Secondary | ICD-10-CM | POA: Diagnosis not present

## 2017-06-10 DIAGNOSIS — M6281 Muscle weakness (generalized): Secondary | ICD-10-CM | POA: Diagnosis not present

## 2017-06-10 DIAGNOSIS — R262 Difficulty in walking, not elsewhere classified: Secondary | ICD-10-CM | POA: Diagnosis not present

## 2017-06-10 DIAGNOSIS — M25561 Pain in right knee: Secondary | ICD-10-CM | POA: Diagnosis not present

## 2017-06-14 DIAGNOSIS — F418 Other specified anxiety disorders: Secondary | ICD-10-CM | POA: Diagnosis not present

## 2017-06-15 DIAGNOSIS — M25561 Pain in right knee: Secondary | ICD-10-CM | POA: Diagnosis not present

## 2017-06-15 DIAGNOSIS — M6281 Muscle weakness (generalized): Secondary | ICD-10-CM | POA: Diagnosis not present

## 2017-06-15 DIAGNOSIS — R262 Difficulty in walking, not elsewhere classified: Secondary | ICD-10-CM | POA: Diagnosis not present

## 2017-06-15 DIAGNOSIS — M25562 Pain in left knee: Secondary | ICD-10-CM | POA: Diagnosis not present

## 2017-06-17 DIAGNOSIS — M25562 Pain in left knee: Secondary | ICD-10-CM | POA: Diagnosis not present

## 2017-06-17 DIAGNOSIS — M6281 Muscle weakness (generalized): Secondary | ICD-10-CM | POA: Diagnosis not present

## 2017-06-17 DIAGNOSIS — M25561 Pain in right knee: Secondary | ICD-10-CM | POA: Diagnosis not present

## 2017-06-17 DIAGNOSIS — R262 Difficulty in walking, not elsewhere classified: Secondary | ICD-10-CM | POA: Diagnosis not present

## 2017-06-22 DIAGNOSIS — M25562 Pain in left knee: Secondary | ICD-10-CM | POA: Diagnosis not present

## 2017-06-22 DIAGNOSIS — M6281 Muscle weakness (generalized): Secondary | ICD-10-CM | POA: Diagnosis not present

## 2017-06-22 DIAGNOSIS — M25561 Pain in right knee: Secondary | ICD-10-CM | POA: Diagnosis not present

## 2017-06-22 DIAGNOSIS — R262 Difficulty in walking, not elsewhere classified: Secondary | ICD-10-CM | POA: Diagnosis not present

## 2017-06-24 DIAGNOSIS — R278 Other lack of coordination: Secondary | ICD-10-CM | POA: Diagnosis not present

## 2017-06-24 DIAGNOSIS — R262 Difficulty in walking, not elsewhere classified: Secondary | ICD-10-CM | POA: Diagnosis not present

## 2017-06-24 DIAGNOSIS — M25561 Pain in right knee: Secondary | ICD-10-CM | POA: Diagnosis not present

## 2017-06-24 DIAGNOSIS — M6281 Muscle weakness (generalized): Secondary | ICD-10-CM | POA: Diagnosis not present

## 2017-06-29 DIAGNOSIS — R278 Other lack of coordination: Secondary | ICD-10-CM | POA: Diagnosis not present

## 2017-06-29 DIAGNOSIS — R262 Difficulty in walking, not elsewhere classified: Secondary | ICD-10-CM | POA: Diagnosis not present

## 2017-06-29 DIAGNOSIS — M6281 Muscle weakness (generalized): Secondary | ICD-10-CM | POA: Diagnosis not present

## 2017-06-29 DIAGNOSIS — M25561 Pain in right knee: Secondary | ICD-10-CM | POA: Diagnosis not present

## 2017-07-01 DIAGNOSIS — M6281 Muscle weakness (generalized): Secondary | ICD-10-CM | POA: Diagnosis not present

## 2017-07-01 DIAGNOSIS — R262 Difficulty in walking, not elsewhere classified: Secondary | ICD-10-CM | POA: Diagnosis not present

## 2017-07-01 DIAGNOSIS — M25561 Pain in right knee: Secondary | ICD-10-CM | POA: Diagnosis not present

## 2017-07-01 DIAGNOSIS — M25562 Pain in left knee: Secondary | ICD-10-CM | POA: Diagnosis not present

## 2017-07-05 DIAGNOSIS — R262 Difficulty in walking, not elsewhere classified: Secondary | ICD-10-CM | POA: Diagnosis not present

## 2017-07-05 DIAGNOSIS — R278 Other lack of coordination: Secondary | ICD-10-CM | POA: Diagnosis not present

## 2017-07-05 DIAGNOSIS — M6281 Muscle weakness (generalized): Secondary | ICD-10-CM | POA: Diagnosis not present

## 2017-07-05 DIAGNOSIS — M25561 Pain in right knee: Secondary | ICD-10-CM | POA: Diagnosis not present

## 2017-07-07 DIAGNOSIS — R278 Other lack of coordination: Secondary | ICD-10-CM | POA: Diagnosis not present

## 2017-07-07 DIAGNOSIS — M25561 Pain in right knee: Secondary | ICD-10-CM | POA: Diagnosis not present

## 2017-07-07 DIAGNOSIS — M6281 Muscle weakness (generalized): Secondary | ICD-10-CM | POA: Diagnosis not present

## 2017-07-07 DIAGNOSIS — R262 Difficulty in walking, not elsewhere classified: Secondary | ICD-10-CM | POA: Diagnosis not present

## 2017-07-08 ENCOUNTER — Other Ambulatory Visit: Payer: Self-pay | Admitting: Hematology and Oncology

## 2017-07-08 ENCOUNTER — Inpatient Hospital Stay (HOSPITAL_BASED_OUTPATIENT_CLINIC_OR_DEPARTMENT_OTHER): Payer: Medicare Other | Admitting: Hematology and Oncology

## 2017-07-08 ENCOUNTER — Telehealth: Payer: Self-pay | Admitting: Hematology and Oncology

## 2017-07-08 ENCOUNTER — Inpatient Hospital Stay: Payer: Medicare Other | Attending: Hematology and Oncology

## 2017-07-08 ENCOUNTER — Encounter: Payer: Self-pay | Admitting: Hematology and Oncology

## 2017-07-08 VITALS — BP 128/72 | HR 70 | Temp 97.5°F | Resp 18 | Ht 62.0 in | Wt 175.1 lb

## 2017-07-08 DIAGNOSIS — Z7982 Long term (current) use of aspirin: Secondary | ICD-10-CM

## 2017-07-08 DIAGNOSIS — Z79899 Other long term (current) drug therapy: Secondary | ICD-10-CM | POA: Insufficient documentation

## 2017-07-08 DIAGNOSIS — Z853 Personal history of malignant neoplasm of breast: Secondary | ICD-10-CM

## 2017-07-08 DIAGNOSIS — Z8572 Personal history of non-Hodgkin lymphomas: Secondary | ICD-10-CM | POA: Diagnosis not present

## 2017-07-08 DIAGNOSIS — D693 Immune thrombocytopenic purpura: Secondary | ICD-10-CM

## 2017-07-08 DIAGNOSIS — D638 Anemia in other chronic diseases classified elsewhere: Secondary | ICD-10-CM | POA: Diagnosis not present

## 2017-07-08 LAB — CBC WITH DIFFERENTIAL/PLATELET
BASOS ABS: 0.1 10*3/uL (ref 0.0–0.1)
Basophils Relative: 1 %
EOS ABS: 0.2 10*3/uL (ref 0.0–0.5)
Eosinophils Relative: 2 %
HCT: 36.1 % (ref 34.8–46.6)
HEMOGLOBIN: 11.7 g/dL (ref 11.6–15.9)
LYMPHS ABS: 1.7 10*3/uL (ref 0.9–3.3)
Lymphocytes Relative: 25 %
MCH: 28.4 pg (ref 25.1–34.0)
MCHC: 32.4 g/dL (ref 31.5–36.0)
MCV: 87.6 fL (ref 79.5–101.0)
Monocytes Absolute: 0.4 10*3/uL (ref 0.1–0.9)
Monocytes Relative: 6 %
Neutro Abs: 4.3 10*3/uL (ref 1.5–6.5)
Neutrophils Relative %: 66 %
PLATELETS: 60 10*3/uL — AB (ref 145–400)
RBC: 4.12 MIL/uL (ref 3.70–5.45)
RDW: 13.7 % (ref 11.2–14.5)
WBC: 6.5 10*3/uL (ref 3.9–10.3)

## 2017-07-08 LAB — COMPREHENSIVE METABOLIC PANEL
ALBUMIN: 3.9 g/dL (ref 3.5–5.0)
ALK PHOS: 60 U/L (ref 40–150)
ALT: 19 U/L (ref 0–55)
AST: 14 U/L (ref 5–34)
Anion gap: 7 (ref 3–11)
BUN: 15 mg/dL (ref 7–26)
CALCIUM: 10.4 mg/dL (ref 8.4–10.4)
CHLORIDE: 104 mmol/L (ref 98–109)
CO2: 26 mmol/L (ref 22–29)
CREATININE: 0.79 mg/dL (ref 0.60–1.10)
GFR calc non Af Amer: >60 mL/min (ref >60)
GLUCOSE: 98 mg/dL (ref 70–140)
Potassium: 4.7 mmol/L (ref 3.5–5.1)
SODIUM: 137 mmol/L (ref 136–145)
Total Bilirubin: 0.7 mg/dL (ref 0.2–1.2)
Total Protein: 6.9 g/dL (ref 6.4–8.3)

## 2017-07-08 NOTE — Assessment & Plan Note (Signed)
I could not palpate any new lymphadenopathy but the patient has gained almost 20 pounds of weight since the last time I saw her and due to her obesity, it could have obscured signs of lymphoma/lymph node swelling I will order CT imaging for further evaluation

## 2017-07-08 NOTE — Progress Notes (Signed)
[name] OFFICE PROGRESS NOTE  Patient Care Team: Thressa Sheller, MD as PCP - General (Internal Medicine)  ASSESSMENT & PLAN:  Chronic ITP (idiopathic thrombocytopenia) (HCC) The patient have acute on chronic ITP I am concerned about possible recurrence of cancer such as lymphoma that could be associated with recurrent ITP I recommend CT scan of the chest, abdomen and pelvis to exclude lymphoma I plan to see her back in 10 days to review test results There is no contraindication to remain on antiplatelet agents or anticoagulants as long as the platelet is greater than 50,000.    History of non-Hodgkin's lymphoma I could not palpate any new lymphadenopathy but the patient has gained almost 20 pounds of weight since the last time I saw her and due to her obesity, it could have obscured signs of lymphoma/lymph node swelling I will order CT imaging for further evaluation  History of breast cancer in female Clinically, she have no signs of recurrence. She will continue screening mammogram     Orders Placed This Encounter  Procedures  . CT ABDOMEN PELVIS W CONTRAST    Standing Status:   Future    Standing Expiration Date:   07/09/2018    Order Specific Question:   If indicated for the ordered procedure, I authorize the administration of contrast media per Radiology protocol    Answer:   Yes    Order Specific Question:   Preferred imaging location?    Answer:   [facility]    Order Specific Question:   Radiology Contrast Protocol - do NOT remove file path    Answer:   \\[email or web address]  . CT CHEST W CONTRAST    Standing Status:   Future    Standing Expiration Date:   07/09/2018    Order Specific Question:   If indicated for the ordered procedure, I authorize the administration of contrast media per Radiology protocol    Answer:   Yes    Order Specific Question:   Preferred imaging location?    Answer:   [facility]   Order Specific Question:   Radiology Contrast Protocol - do NOT remove file path    Answer:   \\[email or web address]    INTERVAL HISTORY: Please see below for problem oriented charting. She returns for further follow-up She continues to have balance issue with recurrent falls but denies recent injuries She has appointment to see neurologist She has gained almost 20 pounds of weight since the last time I saw her She bruises easily The patient denies any recent signs or symptoms of bleeding such as spontaneous epistaxis, hematuria or hematochezia. She denies new lymphadenopathy She denies recent infection She denies any recent abnormal breast examination, palpable mass, abnormal breast appearance or nipple changes   SUMMARY OF ONCOLOGIC HISTORY:  Darlene [name] was transferred to my care after her prior physician has left.  I reviewed the patient's records extensive and collaborated the history with the patient. 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 She never required any systemic treatment. 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 This has also resolved on its own. She has never developed any additional adenopathy other than the single lymph node palpable in the left supraclavicular fossa.  She did develop a second primary stage I,  0.8 cm,  ER negative  cancer of the right breast, treated with lumpectomy, radiation and six cycles of CMF chemotherapy in 1994. No evidence of a new disease from the breast cancer either. Most recent mammogram done in June 2015 showed stable fibroglandular changes in her breasts. She was called back for an ultrasound of the left breast which also came back benign. She had a CT scan of the abdomen done on 11/14/15  because of significant intentional weight loss and abdominal discomfort.  Imaging studies show no evidence of lymphoma.  Moderate stool burden was noted  REVIEW OF SYSTEMS:   Constitutional: Denies fevers, chills or abnormal weight loss Eyes: Denies blurriness of vision Ears, nose, mouth, throat, and face: Denies mucositis or sore throat Respiratory: Denies cough, dyspnea or wheezes Cardiovascular: Denies palpitation, chest discomfort or lower extremity swelling Gastrointestinal:  Denies nausea, heartburn or change in bowel habits Skin: Denies abnormal skin rashes Lymphatics: Denies new lymphadenopathy  Neurological:Denies numbness, tingling or new weaknesses Behavioral/Psych: Mood is stable, no new changes  All other systems were reviewed with the patient and are negative.  I have reviewed the past medical history, past surgical history, social history and family history with the patient and they are unchanged from previous note.  ALLERGIES:  is allergic to oxycodone; codeine; doxycycline; meperidine and related; sulfa antibiotics; and sulfacetamide sodium.  MEDICATIONS:  Current Outpatient Medications  Medication Sig Dispense Refill  . magnesium oxide (MAG-OX) 400 MG tablet Take 400 mg by mouth daily.    Marland Kitchen acetaminophen (TYLENOL) 500 MG tablet Take 1 tablet (500 mg total) by mouth every 6 (six) hours as needed for mild pain. 30 tablet 0  . aspirin 81 MG tablet Take 81 mg by mouth daily.    Marland Kitchen buPROPion (WELLBUTRIN XL) 150 MG 24 hr tablet Take 150 mg by mouth daily.    . cholecalciferol (VITAMIN D) 1000 UNITS tablet Take 1,000 Units by mouth daily.    . citalopram (CELEXA) 40 MG tablet Take 40 mg by mouth daily.    . Cyanocobalamin (VITAMIN B-12 PO) Take 1,000 mcg by mouth daily.    Marland Kitchen esomeprazole (NEXIUM) 40 MG capsule Take 40 mg by mouth 2 (two) times daily before a meal.     . famciclovir (FAMVIR) 125 MG tablet Take 250 mg by mouth 2 (two) times daily as needed. For 5 days as needed  for outbreak    . L-Methylfolate-Algae-B12-B6 (METANX) 3-90.314-2-35 MG CAPS Take 1 tablet by mouth 2 (two) times daily.    Marland Kitchen levothyroxine (SYNTHROID, LEVOTHROID) 75 MCG tablet Take 75 mcg by mouth daily.      Marland Kitchen lovastatin (MEVACOR) 20 MG tablet Take 20 mg by mouth at bedtime.      Marland Kitchen LYRICA 50 MG capsule Take 50 mg by mouth 2 (two) times daily.     . metoprolol succinate (TOPROL-XL) 25 MG 24 hr tablet Take 25 mg by mouth daily.     . Multiple Vitamins-Minerals (MULTIVITAMINS THER. W/MINERALS) TABS Take 1 tablet by mouth daily.      Vladimir Faster Glycol-Propyl Glycol (SYSTANE) 0.4-0.3 % SOLN Apply 2 drops to eye 2 (two) times daily as needed (for dry eyes).    . Vitamin D, Ergocalciferol, (DRISDOL) 50000 UNITS CAPS Take 50,000 Units by mouth every 14 (fourteen) days.       No current facility-administered medications for this visit.     PHYSICAL EXAMINATION: ECOG PERFORMANCE STATUS: 2 - Symptomatic, <50% confined to bed  Vitals:   07/08/17 1137  BP: 128/72  Pulse: 70  Resp: 18  Temp: (!) 97.5 F (36.4 C)  SpO2: 97%   Filed Weights   07/08/17 1137  Weight: 175 lb 1.6 oz (79.4 kg)    GENERAL:alert, no distress and comfortable.  She appears frail.  She have difficulties getting to the examination table due to her balance issue SKIN: skin color, texture, turgor are normal, no rashes or significant lesions.  Noticed skin bruising  eYES: normal, Conjunctiva are pink and non-injected, sclera clear OROPHARYNX:no exudate, no erythema and lips, buccal mucosa, and tongue normal  NECK: supple, thyroid normal size, non-tender, without nodularity LYMPH:  no palpable lymphadenopathy in the cervical, axillary or inguinal LUNGS: clear to auscultation and percussion with normal breathing effort HEART: regular rate & rhythm and no murmurs and no lower extremity edema ABDOMEN:abdomen soft, non-tender and normal bowel sounds Musculoskeletal:no cyanosis of digits and no clubbing  NEURO: alert &  oriented x 3 with fluent speech, no focal motor/sensory deficits.  Her balance is not tested Bilateral breast examination were performed.  Well-healed lumpectomy scar on the right breast  LABORATORY DATA:  I have reviewed the data as listed    Component Value Date/Time   NA 137 07/08/2017 1053   NA 135 (L) 07/07/2016 1035   K 4.7 07/08/2017 1053   K 4.8 07/07/2016 1035   CL 104 07/08/2017 1053   CL 106 07/11/2012 0906   CO2 26 07/08/2017 1053   CO2 24 07/07/2016 1035   GLUCOSE 98 07/08/2017 1053   GLUCOSE 85 07/07/2016 1035   GLUCOSE 107 (H) 07/11/2012 0906   BUN 15 07/08/2017 1053   BUN 19.9 07/07/2016 1035   CREATININE 0.79 07/08/2017 1053   CREATININE 0.7 07/07/2016 1035   CALCIUM 10.4 07/08/2017 1053   CALCIUM 10.2 07/07/2016 1035   PROT 6.9 07/08/2017 1053   PROT 6.6 07/07/2016 1035   ALBUMIN 3.9 07/08/2017 1053   ALBUMIN 3.9 07/07/2016 1035   AST 14 07/08/2017 1053   AST 14 07/07/2016 1035   ALT 19 07/08/2017 1053   ALT 17 07/07/2016 1035   ALKPHOS 60 07/08/2017 1053   ALKPHOS 58 07/07/2016 1035   BILITOT 0.7 07/08/2017 1053   BILITOT 0.61 07/07/2016 1035   GFRNONAA >60 07/08/2017 1053   GFRAA >60 07/08/2017 1053    No results found for: SPEP, UPEP  Lab Results  Component Value Date   WBC 6.5 07/08/2017   NEUTROABS 4.3 07/08/2017   HGB 11.7 07/08/2017   HCT 36.1 07/08/2017   MCV 87.6 07/08/2017   PLT 60 (L) 07/08/2017      Chemistry      Component Value Date/Time   NA 137 07/08/2017 1053   NA 135 (L) 07/07/2016 1035   K 4.7 07/08/2017 1053   K 4.8 07/07/2016 1035   CL 104 07/08/2017 1053   CL 106 07/11/2012 0906   CO2 26 07/08/2017 1053   CO2 24 07/07/2016 1035   BUN 15 07/08/2017 1053   BUN 19.9 07/07/2016 1035   CREATININE 0.79 07/08/2017 1053   CREATININE 0.7 07/07/2016 1035      Component Value Date/Time   CALCIUM 10.4 07/08/2017 1053   CALCIUM 10.2 07/07/2016 1035   ALKPHOS 60 07/08/2017 1053   ALKPHOS 58 07/07/2016 1035   AST 14  07/08/2017 1053   AST 14 07/07/2016 1035   ALT 19 07/08/2017 1053   ALT 17 07/07/2016 1035   BILITOT 0.7 07/08/2017 1053   BILITOT 0.61 07/07/2016 1035      All questions were answered. The  patient knows to call the clinic with any problems, questions or concerns. No barriers to learning was detected.  I spent 25 minutes counseling the patient face to face. The total time spent in the appointment was 30 minutes and more than 50% was on counseling and review of test results  Heath Lark, MD 07/08/2017 4:10 PM

## 2017-07-08 NOTE — Telephone Encounter (Signed)
Gave patient AVS and calendar of upcoming march appointments.

## 2017-07-08 NOTE — Assessment & Plan Note (Addendum)
The patient have acute on chronic ITP I am concerned about possible recurrence of cancer such as lymphoma that could be associated with recurrent ITP I recommend CT scan of the chest, abdomen and pelvis to exclude lymphoma I plan to see her back in 10 days to review test results There is no contraindication to remain on antiplatelet agents or anticoagulants as long as the platelet is greater than 50,000.

## 2017-07-08 NOTE — Assessment & Plan Note (Signed)
Clinically, she have no signs of recurrence. She will continue screening mammogram

## 2017-07-12 ENCOUNTER — Ambulatory Visit: Payer: Medicare Other | Admitting: Hematology and Oncology

## 2017-07-12 ENCOUNTER — Other Ambulatory Visit: Payer: Medicare Other

## 2017-07-13 ENCOUNTER — Other Ambulatory Visit: Payer: Self-pay | Admitting: Hematology and Oncology

## 2017-07-13 ENCOUNTER — Ambulatory Visit (HOSPITAL_COMMUNITY)
Admission: RE | Admit: 2017-07-13 | Discharge: 2017-07-13 | Disposition: A | Discharge status: Home / Self Care | Payer: Medicare Other | Source: Ambulatory Visit | Attending: Hematology and Oncology | Admitting: Hematology and Oncology

## 2017-07-13 DIAGNOSIS — D693 Immune thrombocytopenic purpura: Secondary | ICD-10-CM | POA: Diagnosis not present

## 2017-07-13 DIAGNOSIS — C8590 Non-Hodgkin lymphoma, unspecified, unspecified site: Secondary | ICD-10-CM | POA: Diagnosis not present

## 2017-07-13 DIAGNOSIS — R590 Localized enlarged lymph nodes: Secondary | ICD-10-CM | POA: Diagnosis not present

## 2017-07-13 DIAGNOSIS — Z8572 Personal history of non-Hodgkin lymphomas: Secondary | ICD-10-CM | POA: Insufficient documentation

## 2017-07-13 DIAGNOSIS — Z853 Personal history of malignant neoplasm of breast: Secondary | ICD-10-CM | POA: Diagnosis not present

## 2017-07-13 MED ORDER — IOPAMIDOL (ISOVUE-300) INJECTION 61%
INTRAVENOUS | Status: AC
  Filled 2017-07-13: qty 100

## 2017-07-13 MED ORDER — SODIUM CHLORIDE 0.9 % IJ SOLN
INTRAMUSCULAR | Status: AC
  Filled 2017-07-13: qty 50

## 2017-07-13 MED ORDER — IOPAMIDOL (ISOVUE-300) INJECTION 61%
100.0000 mL | Freq: Once | INTRAVENOUS | Status: AC | PRN
  Administered 2017-07-13: 100 mL via INTRAVENOUS

## 2017-07-15 DIAGNOSIS — M810 Age-related osteoporosis without current pathological fracture: Secondary | ICD-10-CM | POA: Diagnosis not present

## 2017-07-15 DIAGNOSIS — D696 Thrombocytopenia, unspecified: Secondary | ICD-10-CM | POA: Diagnosis not present

## 2017-07-15 DIAGNOSIS — E039 Hypothyroidism, unspecified: Secondary | ICD-10-CM | POA: Diagnosis not present

## 2017-07-15 DIAGNOSIS — E785 Hyperlipidemia, unspecified: Secondary | ICD-10-CM | POA: Diagnosis not present

## 2017-07-19 ENCOUNTER — Ambulatory Visit (INDEPENDENT_AMBULATORY_CARE_PROVIDER_SITE_OTHER): Payer: Medicare Other | Admitting: Neurology

## 2017-07-19 ENCOUNTER — Encounter: Payer: Self-pay | Admitting: Neurology

## 2017-07-19 ENCOUNTER — Other Ambulatory Visit (INDEPENDENT_AMBULATORY_CARE_PROVIDER_SITE_OTHER): Payer: Medicare Other

## 2017-07-19 ENCOUNTER — Other Ambulatory Visit: Payer: Medicare Other

## 2017-07-19 ENCOUNTER — Ambulatory Visit: Payer: Medicare Other | Admitting: Hematology and Oncology

## 2017-07-19 VITALS — BP 120/62 | HR 76 | Resp 16 | Ht 61.0 in | Wt 173.4 lb

## 2017-07-19 DIAGNOSIS — I951 Orthostatic hypotension: Secondary | ICD-10-CM

## 2017-07-19 DIAGNOSIS — R292 Abnormal reflex: Secondary | ICD-10-CM

## 2017-07-19 DIAGNOSIS — M48061 Spinal stenosis, lumbar region without neurogenic claudication: Secondary | ICD-10-CM

## 2017-07-19 DIAGNOSIS — G609 Hereditary and idiopathic neuropathy, unspecified: Secondary | ICD-10-CM

## 2017-07-19 LAB — TSH: TSH: 2.48 u[IU]/mL (ref 0.35–4.50)

## 2017-07-19 NOTE — Patient Instructions (Addendum)
Start using a 4 wheeled rollator   Check labs  MRI lumbar spine without contrast  Stay well-hydrated, use compression stockings  Return to clinic 4 months

## 2017-07-19 NOTE — Progress Notes (Signed)
[facility] Clinic Note - Initial Visit   Date: 07/19/17  [name] MRN: [id number] DOB: Feb 23, 1935   Dear Dr. Noemi Chapel:  Thank you for your kind referral of Darlene [name] for consultation of gait imbalance. Although her history is well known to you, please allow [name] to reiterate it for the purpose of our medical record. The patient was accompanied to the clinic by self.    History of Present Illness: Darlene [name] is a 83 y.o. left-handed Caucasian female with depression, GERD, hypothyroidism, and history of breast cancer s/p chemotherapy and radiation (1994), chronic ITP, and lymphoma (1999) presenting for evaluation of imbalance.    Starting ~ 2017, she gradually began having problems with balance.  She feels unsteady when walking and began using a cane 2 years ago.  She has fallen four times over the past year, usually because of tripping over objects. Her legs do not give out with these fells, she tends to always loose her balance.  She fractured her left patella with one of her falls.  She was diagnosed with neuropathy in 2015 and continues to have numbness and tingling of the feet. She takes Lyrica 50mg  twice daily which helps her pain.  She does not have any weakness of the legs.  She endorses mild lightheadedness.  No chest pain or shortness of breath.    Out-side paper records, electronic medical record, and images have been reviewed where available and summarized as:  Lab Results  Component Value Date   TSH 1.332 11/18/2015   MRI cervical spine wo contrast 01/13/2015: This is an abnormal MRI of the cervical spine showing multilevel degenerative changes as detailed above. The most significant findings are: 1.  At C4-C5 there is loss of disc height associated with edema within the endplates. There is moderate bilateral foraminal narrowing. Although there is no definite nerve root compression there is some encroachment upon both of the  exiting C5 nerve roots. 2.  At C5-C6 there or degenerative changes causing moderate right foraminal narrowing. Although there is no nerve root compression there is some encroachment upon the exiting C6 nerve root. 3.  There are milder degenerative changes at C3-C3, C3-C4 and C6-C7 that did not lead to any nerve root impingement. 4.   The spinal cord appears normal.   Past Medical History:  Diagnosis Date  . Anxiety   . Breast cancer, stage 1, estrogen receptor negative ([place]) 07/06/2011  . Cancer of breast ([place])   . Cataract   . Chronic ITP (idiopathic thrombocytopenia) ([facility]) 07/06/2011  . Constipation    takes Colace daily  . COPD (chronic obstructive pulmonary disease) ([facility])    mild  . Depression    "nervous break down age 78"  . GERD (gastroesophageal reflux disease)    takes Nexium daily  . History of colon polyps   . History of depression    received shock treatments   . History of palpitations    takes Metoprolol daily  . History of vertigo   . HOH (hard of hearing)    has hearing aids  . Hyperlipidemia    takes Lovastatin daily  . Hypothyroid 07/06/2011   takes SYnthroid daily  . Joint pain   . Joint swelling   . Lymphoma ([place])   . Lymphoma ([place])    B cell   . Lymphoma of lymph nodes of head, face, and/or neck ([place]) 07/06/2011  . Neuropathy   . Nocturia   . Osteopenia   .  Osteoporosis    hx of and take Reclast  . Peripheral neuropathy   . Personal history of chemotherapy   . Personal history of radiation therapy   . Right knee DJD   . Shortness of breath dyspnea    with exertion  . Thyroid disease   . Umbilical hernia     Past Surgical History:  Procedure Laterality Date  . ADENOIDECTOMY     as a child  . BREAST LUMPECTOMY Right 1994  . BREAST SURGERY Right   . BUNIONECTOMY Right   . CARDIAC CATHETERIZATION  2014  . COLONOSCOPY    . FOOT SURGERY Right   . INSERTION OF MESH N/A 04/17/2015   Procedure: INSERTION OF MESH;  Surgeon: Ralene Ok, MD;   Location: [facility];  Service: General;  Laterality: N/A;  . KNEE SURGERY    . LEFT HEART CATHETERIZATION WITH CORONARY ANGIOGRAM N/A 12/05/2012   Procedure: LEFT HEART CATHETERIZATION WITH CORONARY ANGIOGRAM;  Surgeon: [name] Sine, MD;  Location: [facility] CATH LAB;  Service: Cardiovascular;  Laterality: N/A;  . right  breast surgery     d/t breast cancer  . TONSILLECTOMY     as a child  . TOTAL KNEE ARTHROPLASTY Right 06/19/2013   Procedure: TOTAL KNEE ARTHROPLASTY;  Surgeon: Lorn Junes, MD;  Location: [facility];  Service: Orthopedics;  Laterality: Right;  . TURBINATE REDUCTION    . UMBILICAL HERNIA REPAIR N/A 04/17/2015   Procedure: LAPAROSCOPIC UMBILICAL HERNIA REPAIR WITH MESH;  Surgeon: Ralene Ok, MD;  Location: [facility];  Service: General;  Laterality: N/A;  . UPPER GASTROINTESTINAL ENDOSCOPY       Medications:  Outpatient Encounter Medications as of 07/19/2017  Medication Sig Note  . acetaminophen (TYLENOL) 500 MG tablet Take 1 tablet (500 mg total) by mouth every 6 (six) hours as needed for mild pain.   Marland Kitchen aspirin 81 MG tablet Take 81 mg by mouth daily.   Marland Kitchen buPROPion (WELLBUTRIN XL) 150 MG 24 hr tablet Take 150 mg by mouth daily.   . calcium carbonate (OSCAL) 1500 (600 Ca) MG TABS tablet Take 600 mg of elemental calcium by mouth 2 (two) times daily with a meal.   . cholecalciferol (VITAMIN D) 1000 UNITS tablet Take 1,000 Units by mouth daily.   . citalopram (CELEXA) 40 MG tablet Take 40 mg by mouth daily.   . Cyanocobalamin (VITAMIN B-12 PO) Take 1,000 mcg by mouth daily.   Marland Kitchen esomeprazole (NEXIUM) 40 MG capsule Take 40 mg by mouth 2 (two) times daily before a meal.    . famciclovir (FAMVIR) 125 MG tablet Take 250 mg by mouth 2 (two) times daily as needed. For 5 days as needed for outbreak   . L-Methylfolate-Algae-B12-B6 (METANX) 3-90.314-2-35 MG CAPS Take 1 tablet by mouth 2 (two) times daily.   Marland Kitchen levothyroxine (SYNTHROID, LEVOTHROID) 75 MCG tablet Take 75 mcg by mouth daily.     Marland Kitchen  lovastatin (MEVACOR) 20 MG tablet Take 20 mg by mouth at bedtime.     Marland Kitchen LYRICA 50 MG capsule Take 50 mg by mouth 2 (two) times daily.    . magnesium oxide (MAG-OX) 400 MG tablet Take 400 mg by mouth daily.   . metoprolol succinate (TOPROL-XL) 25 MG 24 hr tablet Take 25 mg by mouth daily.    . Multiple Vitamins-Minerals (MULTIVITAMINS THER. W/MINERALS) TABS Take 1 tablet by mouth daily.     Vladimir Faster Glycol-Propyl Glycol (SYSTANE) 0.4-0.3 % SOLN Apply 2 drops to eye 2 (two) times  daily as needed (for dry eyes).   . Vitamin D, Ergocalciferol, (DRISDOL) 50000 UNITS CAPS Take 50,000 Units by mouth every 14 (fourteen) days.   04/19/2016: Pt thinks she is due for another pill this upcoming week    No facility-administered encounter medications on file as of 07/19/2017.      Allergies:  Allergies  Allergen Reactions  . Oxycodone Other (See Comments)    Made her depressed  . Codeine Itching           . Doxycycline Swelling and Other (See Comments)    Burns throat also   . Meperidine And Related Itching and Rash    Makes cheeks red also  . Sulfa Antibiotics Itching  . Sulfacetamide Sodium Itching    Family History: Family History  Problem Relation Age of Onset  . Hyperlipidemia Father   . Heart disease Father   . Heart disease Sister   . Cancer Brother   . Diabetes Son   . Heart disease Maternal Grandfather   . Colon cancer Neg Hx   . Breast cancer Neg Hx     Social History: Social History   Tobacco Use  . Smoking status: Former Smoker    Packs/day: 0.30    Types: Cigarettes  . Smokeless tobacco: Never Used  . Tobacco comment: quit smoking 1985  Substance Use Topics  . Alcohol use: No    Alcohol/week: 0.0 oz  . Drug use: No   Social History   Social History Narrative   Lives at home alone.   Right-handed.   4 cups of caffeine daily.    Review of Systems:  CONSTITUTIONAL: No fevers, chills, night sweats, or weight loss.  EYES: No visual changes or eye  pain ENT: No hearing changes.  No history of nose bleeds.   RESPIRATORY: No cough, wheezing and shortness of breath.   CARDIOVASCULAR: Negative for chest pain, and palpitations.   GI: Negative for abdominal discomfort, blood in stools or black stools.  No recent change in bowel habits.   GU:  No history of incontinence.   MUSCLOSKELETAL: +history of joint pain or swelling.  No myalgias.   SKIN: Negative for lesions, rash, and itching.   HEMATOLOGY/ONCOLOGY: Negative for prolonged bleeding, bruising easily, and swollen nodes.  No history of cancer.   ENDOCRINE: Negative for cold or heat intolerance, polydipsia or goiter.   PSYCH:  No depression or anxiety symptoms.   NEURO: As Above.   Vital Signs:  BP 120/62 (BP Location: Left Arm, Cuff Size: Normal)   Pulse 76   Resp 16   Ht 5\' 1"  (1.549 m)   Wt 173 lb 6.4 oz (78.7 kg)   SpO2 96%   BMI 32.76 kg/m  Orthostatic VS for the past 24 hrs (Last 3 readings):  BP- Lying Pulse- Lying BP- Sitting Pulse- Sitting BP- Standing at 0 minutes Pulse- Standing at 0 minutes  07/19/17 1315 140/72 65 130/66 67 110/60 70   General Medical Exam:   General:  Well appearing, comfortable.   Eyes/ENT: see cranial nerve examination.   Neck: No masses appreciated.  Full range of motion without tenderness.   Respiratory:  Clear to auscultation, good air entry bilaterally.   Cardiac:  Regular rate and rhythm, no murmur.   Extremities:  No deformities, edema, or skin discoloration.  Skin:  No rashes or lesions.  Neurological Exam: MENTAL STATUS including orientation to time, place, person, recent and remote memory, attention span and concentration, language, and fund of knowledge is  normal.  Speech is not dysarthric.  CRANIAL NERVES: II:  No visual field defects.  Unremarkable fundi.   III-IV-VI: Pupils equal round and reactive to light.  Normal conjugate, extra-ocular eye movements in all directions of gaze.  No nystagmus.     V:  Normal facial sensation    VII:  Normal facial symmetry and movements.  No pathologic facial reflexes.  VIII:  Normal hearing and vestibular function.   IX-X:  Normal palatal movement.   XI:  Normal shoulder shrug and head rotation.   XII:  Normal tongue strength and range of motion, no deviation or fasciculation.  MOTOR:  No atrophy, fasciculations or abnormal movements.  No pronator drift.  Tone is normal.    Right Upper Extremity:    Left Upper Extremity:    Deltoid  5/5   Deltoid  5/5   Biceps  5/5   Biceps  5/5   Triceps  5/5   Triceps  5/5   Wrist extensors  5/5   Wrist extensors  5/5   Wrist flexors  5/5   Wrist flexors  5/5   Finger extensors  5/5   Finger extensors  5/5   Finger flexors  5/5   Finger flexors  5/5   Dorsal interossei  5/5   Dorsal interossei  5/5   Abductor pollicis  5/5   Abductor pollicis  5/5   Tone (Ashworth scale)  0  Tone (Ashworth scale)  0   Right Lower Extremity:    Left Lower Extremity:    Hip flexors  5/5   Hip flexors  5/5   Hip extensors  5/5   Hip extensors  5/5   Knee flexors  5/5   Knee flexors  5/5   Knee extensors  5/5   Knee extensors  5/5   Dorsiflexors  5/5   Dorsiflexors  5/5   Plantarflexors  5/5   Plantarflexors  5/5   Toe extensors  5/5   Toe extensors  5/5   Toe flexors  5/5   Toe flexors  5/5   Tone (Ashworth scale)  0  Tone (Ashworth scale)  0   MSRs:  Right                                                                 Left brachioradialis 2+  brachioradialis 2+  biceps 2+  biceps 2+  triceps 2+  triceps 2+  patellar 2+  patellar 3+  ankle jerk 0  ankle jerk 0  Hoffman no  Hoffman no  plantar response down  plantar response down   SENSORY:  Reduced temperature and vibration distal to ankles bilaterally.  Sensation is intact in the arms.  Romberg sign is present.  COORDINATION/GAIT: Normal finger-to- nose-finger.  Intact rapid alternating movements bilaterally.  Gait appears ataxic and somewhat spastic, assisted with  cane.   IMPRESSION/PLAN: 1.  Idiopathic peripheral neuropathy. She has history of breast cancer and was on chemotherapy, but this was > 20 years ago and symptoms of neuropathy started over the past 2-3 years, so unlikely to be related.  There is no family history of neuropathy.  No personal history of diabetes or alcohol use.   - Check vitamin B12, vitamin B1, TSH, copper, folate 2.  Orthostasis  -  orthostatic vital signs show 30pt drop in blood pressure with positional change  - recommend that she stay well-hydrated and use compression stockings 3.  Lumbar canal stenosis is suspected as she has asymmetrical brisk reflex on the left patella and slight spastic quality to gait  - MRI lumbar spine without contrast 4.  Multifactorial gait instability due to #1, #2, #3  - Physical therapy completed for gait training  - Recommend using a 4 wheeled rollator, as a cane is not providing adequate support  - Fall precautions discussed  Return to clinic in 4 months.   Thank you for allowing me to participate in patient's care.  If I can answer any additional questions, I would be pleased to do so.    Sincerely,    Donika K. Posey Pronto, DO

## 2017-07-20 ENCOUNTER — Inpatient Hospital Stay (HOSPITAL_BASED_OUTPATIENT_CLINIC_OR_DEPARTMENT_OTHER): Payer: Medicare Other | Admitting: Hematology and Oncology

## 2017-07-20 ENCOUNTER — Telehealth: Payer: Self-pay | Admitting: Hematology and Oncology

## 2017-07-20 ENCOUNTER — Telehealth: Payer: Self-pay | Admitting: Neurology

## 2017-07-20 ENCOUNTER — Inpatient Hospital Stay: Payer: Medicare Other

## 2017-07-20 ENCOUNTER — Encounter: Payer: Self-pay | Admitting: Hematology and Oncology

## 2017-07-20 DIAGNOSIS — D693 Immune thrombocytopenic purpura: Secondary | ICD-10-CM

## 2017-07-20 DIAGNOSIS — Z7982 Long term (current) use of aspirin: Secondary | ICD-10-CM

## 2017-07-20 DIAGNOSIS — Z79899 Other long term (current) drug therapy: Secondary | ICD-10-CM | POA: Diagnosis not present

## 2017-07-20 DIAGNOSIS — Z8572 Personal history of non-Hodgkin lymphomas: Secondary | ICD-10-CM | POA: Diagnosis not present

## 2017-07-20 DIAGNOSIS — D638 Anemia in other chronic diseases classified elsewhere: Secondary | ICD-10-CM | POA: Diagnosis not present

## 2017-07-20 DIAGNOSIS — Z853 Personal history of malignant neoplasm of breast: Secondary | ICD-10-CM | POA: Diagnosis not present

## 2017-07-20 LAB — CBC WITH DIFFERENTIAL/PLATELET
Basophils Absolute: 0 10*3/uL (ref 0.0–0.1)
Basophils Relative: 1 %
Eosinophils Absolute: 0.2 10*3/uL (ref 0.0–0.5)
Eosinophils Relative: 3 %
HEMATOCRIT: 34.1 % — AB (ref 34.8–46.6)
Hemoglobin: 11 g/dL — ABNORMAL LOW (ref 11.6–15.9)
LYMPHS PCT: 24 %
Lymphs Abs: 1.3 10*3/uL (ref 0.9–3.3)
MCH: 28.3 pg (ref 25.1–34.0)
MCHC: 32.3 g/dL (ref 31.5–36.0)
MCV: 87.7 fL (ref 79.5–101.0)
MONO ABS: 0.4 10*3/uL (ref 0.1–0.9)
MONOS PCT: 7 %
NEUTROS ABS: 3.6 10*3/uL (ref 1.5–6.5)
Neutrophils Relative %: 65 %
Platelets: 69 10*3/uL — ABNORMAL LOW (ref 145–400)
RBC: 3.89 MIL/uL (ref 3.70–5.45)
RDW: 13.6 % (ref 11.2–14.5)
WBC: 5.4 10*3/uL (ref 3.9–10.3)

## 2017-07-20 LAB — FOLATE

## 2017-07-20 NOTE — Telephone Encounter (Signed)
Gave patient AVs and calendar of upcoming march appointments.

## 2017-07-20 NOTE — Assessment & Plan Note (Signed)
CT scan show no evidence of cancer recurrence.  She will continue screening mammogram

## 2017-07-20 NOTE — Assessment & Plan Note (Signed)
CT imaging shows stable lymphadenopathy but no change I would not recommend biopsy and it is not likely due to recurrence of lymphoma

## 2017-07-20 NOTE — Assessment & Plan Note (Signed)
She has recent acute on chronic ITP, could be exacerbated by infection although she could not remember feeling unwell recently In any case, her platelet count has improved I recommend close follow-up next week for repeat blood work As long as her platelet count is greater than 50,000, she does not need to be treated for ITP I educated the patient and her daughter signs and symptoms to watch out for bleeding

## 2017-07-20 NOTE — Telephone Encounter (Signed)
Notes refaxed.

## 2017-07-20 NOTE — Progress Notes (Signed)
[facility] OFFICE PROGRESS NOTE  Patient Care Team: Thressa Sheller, MD as PCP - General (Internal Medicine)  ASSESSMENT & PLAN:  History of breast cancer in female CT scan show no evidence of cancer recurrence.  She will continue screening mammogram  History of non-Hodgkin's lymphoma CT imaging shows stable lymphadenopathy but no change I would not recommend biopsy and it is not likely due to recurrence of lymphoma  Chronic ITP (idiopathic thrombocytopenia) (HCC) She has recent acute on chronic ITP, could be exacerbated by infection although she could not remember feeling unwell recently In any case, her platelet count has improved I recommend close follow-up next week for repeat blood work As long as her platelet count is greater than 50,000, she does not need to be treated for ITP I educated the patient and her daughter signs and symptoms to watch out for bleeding   No orders of the defined types were placed in this encounter.   INTERVAL HISTORY: Please see below for problem oriented charting. She returns with her daughter for further follow-up She feels well The patient denies any recent signs or symptoms of bleeding such as spontaneous epistaxis, hematuria or hematochezia.   SUMMARY OF ONCOLOGIC HISTORY:  Darlene [name] was transferred to my care after her prior physician has left.  I reviewed the patient's records extensive and collaborated the history with the patient. 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 She never required any systemic treatment. 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 This has also resolved on its own. She has never developed any additional adenopathy other than the single lymph node palpable in the left  supraclavicular fossa.  She did develop a second primary stage I,  0.8 cm,  ER negative cancer of the right breast, treated with lumpectomy, radiation and six cycles of CMF chemotherapy in 1994. No evidence of a new disease from the breast cancer either. Most recent mammogram done in June 2015 showed stable fibroglandular changes in her breasts. She was called back for an ultrasound of the left breast which also came back benign. She had a CT scan of the abdomen done on 11/14/15 because of significant intentional weight loss and abdominal discomfort.  Imaging studies show no evidence of lymphoma.  Moderate stool burden was noted. Repeat CT scan in March 2019 show stable lymphadenopathy with no signs or symptoms of cancer recurrence  REVIEW OF SYSTEMS:   Constitutional: Denies fevers, chills or abnormal weight loss Eyes: Denies blurriness of vision Ears, nose, mouth, throat, and face: Denies mucositis or sore throat Respiratory: Denies cough, dyspnea or wheezes Cardiovascular: Denies palpitation, chest discomfort or lower extremity swelling Gastrointestinal:  Denies nausea, heartburn or change in bowel habits Skin: Denies abnormal skin rashes Lymphatics: Denies new lymphadenopathy or easy bruising Neurological:Denies numbness, tingling or new weaknesses Behavioral/Psych: Mood is stable, no new changes  All other systems were reviewed with the patient and are negative.  I have reviewed the past medical history, past surgical history, social history and family history with the patient and they are unchanged from previous note.  ALLERGIES:  is allergic to oxycodone; codeine; doxycycline; meperidine and related; sulfa antibiotics; and sulfacetamide sodium.  MEDICATIONS:  Current Outpatient Medications  Medication Sig Dispense Refill  . acetaminophen (TYLENOL) 500 MG tablet Take 1 tablet (500 mg total) by  mouth every 6 (six) hours as needed for mild pain. 30 tablet 0  . aspirin 81 MG tablet Take  81 mg by mouth daily.    Marland Kitchen buPROPion (WELLBUTRIN XL) 150 MG 24 hr tablet Take 150 mg by mouth daily.    . calcium carbonate (OSCAL) 1500 (600 Ca) MG TABS tablet Take 600 mg of elemental calcium by mouth 2 (two) times daily with a meal.    . cholecalciferol (VITAMIN D) 1000 UNITS tablet Take 1,000 Units by mouth daily.    . citalopram (CELEXA) 40 MG tablet Take 40 mg by mouth daily.    . Cyanocobalamin (VITAMIN B-12 PO) Take 1,000 mcg by mouth daily.    Marland Kitchen esomeprazole (NEXIUM) 40 MG capsule Take 40 mg by mouth 2 (two) times daily before a meal.     . famciclovir (FAMVIR) 125 MG tablet Take 250 mg by mouth 2 (two) times daily as needed. For 5 days as needed for outbreak    . L-Methylfolate-Algae-B12-B6 (METANX) 3-90.314-2-35 MG CAPS Take 1 tablet by mouth 2 (two) times daily.    Marland Kitchen levothyroxine (SYNTHROID, LEVOTHROID) 75 MCG tablet Take 75 mcg by mouth daily.      Marland Kitchen lovastatin (MEVACOR) 20 MG tablet Take 20 mg by mouth at bedtime.      Marland Kitchen LYRICA 50 MG capsule Take 50 mg by mouth 2 (two) times daily.     . magnesium oxide (MAG-OX) 400 MG tablet Take 400 mg by mouth daily.    . metoprolol succinate (TOPROL-XL) 25 MG 24 hr tablet Take 25 mg by mouth daily.     . Multiple Vitamins-Minerals (MULTIVITAMINS THER. W/MINERALS) TABS Take 1 tablet by mouth daily.      Vladimir Faster Glycol-Propyl Glycol (SYSTANE) 0.4-0.3 % SOLN Apply 2 drops to eye 2 (two) times daily as needed (for dry eyes).    . Vitamin D, Ergocalciferol, (DRISDOL) 50000 UNITS CAPS Take 50,000 Units by mouth every 14 (fourteen) days.       No current facility-administered medications for this visit.     PHYSICAL EXAMINATION: ECOG PERFORMANCE STATUS: 0 - Asymptomatic  Vitals:   07/20/17 1150  BP: 130/70  Pulse: 68  Resp: 18  Temp: 97.7 F (36.5 C)  SpO2: 97%   Filed Weights   07/20/17 1150  Weight: 172 lb 12.8 oz (78.4 kg)    GENERAL:alert, no distress and comfortable NEURO: alert & oriented x 3 with fluent speech, no focal  motor/sensory deficits  LABORATORY DATA:  I have reviewed the data as listed    Component Value Date/Time   NA 137 07/08/2017 1053   NA 135 (L) 07/07/2016 1035   K 4.7 07/08/2017 1053   K 4.8 07/07/2016 1035   CL 104 07/08/2017 1053   CL 106 07/11/2012 0906   CO2 26 07/08/2017 1053   CO2 24 07/07/2016 1035   GLUCOSE 98 07/08/2017 1053   GLUCOSE 85 07/07/2016 1035   GLUCOSE 107 (H) 07/11/2012 0906   BUN 15 07/08/2017 1053   BUN 19.9 07/07/2016 1035   CREATININE 0.79 07/08/2017 1053   CREATININE 0.7 07/07/2016 1035   CALCIUM 10.4 07/08/2017 1053   CALCIUM 10.2 07/07/2016 1035   PROT 6.9 07/08/2017 1053   PROT 6.6 07/07/2016 1035   ALBUMIN 3.9 07/08/2017 1053   ALBUMIN 3.9 07/07/2016 1035   AST 14 07/08/2017 1053   AST 14 07/07/2016 1035   ALT 19 07/08/2017 1053   ALT 17 07/07/2016 1035   ALKPHOS 60 07/08/2017 1053  ALKPHOS 58 07/07/2016 1035   BILITOT 0.7 07/08/2017 1053   BILITOT 0.61 07/07/2016 1035   GFRNONAA >60 07/08/2017 1053   GFRAA >60 07/08/2017 1053    No results found for: SPEP, UPEP  Lab Results  Component Value Date   WBC 5.4 07/20/2017   NEUTROABS 3.6 07/20/2017   HGB 11.0 (L) 07/20/2017   HCT 34.1 (L) 07/20/2017   MCV 87.7 07/20/2017   PLT 69 (L) 07/20/2017      Chemistry      Component Value Date/Time   NA 137 07/08/2017 1053   NA 135 (L) 07/07/2016 1035   K 4.7 07/08/2017 1053   K 4.8 07/07/2016 1035   CL 104 07/08/2017 1053   CL 106 07/11/2012 0906   CO2 26 07/08/2017 1053   CO2 24 07/07/2016 1035   BUN 15 07/08/2017 1053   BUN 19.9 07/07/2016 1035   CREATININE 0.79 07/08/2017 1053   CREATININE 0.7 07/07/2016 1035      Component Value Date/Time   CALCIUM 10.4 07/08/2017 1053   CALCIUM 10.2 07/07/2016 1035   ALKPHOS 60 07/08/2017 1053   ALKPHOS 58 07/07/2016 1035   AST 14 07/08/2017 1053   AST 14 07/07/2016 1035   ALT 19 07/08/2017 1053   ALT 17 07/07/2016 1035   BILITOT 0.7 07/08/2017 1053   BILITOT 0.61 07/07/2016 1035        RADIOGRAPHIC STUDIES: I have personally reviewed the radiological images as listed and agreed with the findings in the report. Ct Chest W Contrast  Result Date: 07/13/2017 CLINICAL DATA:  Followup non-Hodgkin lymphoma in right breast carcinoma. Chronic idiopathic thrombocytopenia. EXAM: CT CHEST, ABDOMEN, AND PELVIS WITH CONTRAST TECHNIQUE: Multidetector CT imaging of the chest, abdomen and pelvis was performed following the standard protocol during bolus administration of intravenous contrast. CONTRAST:  116mL ISOVUE-300 IOPAMIDOL (ISOVUE-300) INJECTION 61% COMPARISON:  Chest CTA on 12/02/2012 and AP CT on 11/14/2015 FINDINGS: CT CHEST FINDINGS Cardiovascular: No acute findings. Aortic and coronary artery atherosclerosis. Mediastinum/Lymph Nodes: Mild lymphadenopathy in left lower neck and supraclavicular region shows no significant change. Index lymph node in left supraclavicular region measures 11 mm on image 7/2. No pathologically enlarged mediastinal or hilar lymph nodes identified. Right axillary surgical clips again seen, without evidence of axillary lymphadenopathy. No masses or pathologically enlarged lymph nodes identified. Lungs/Pleura: No pulmonary infiltrate or mass identified. No effusion present. Musculoskeletal:  No suspicious bone lesions identified. CT ABDOMEN AND PELVIS FINDINGS Hepatobiliary: No masses identified. Gallbladder is unremarkable. Pancreas:  No mass or inflammatory changes. Spleen:  Within normal limits in size and appearance. Adrenals/Urinary tract: No masses or hydronephrosis. Unremarkable unopacified urinary bladder. Stomach/Bowel: No evidence of obstruction, inflammatory process, or abnormal fluid collections. Normal appendix visualized. Vascular/Lymphatic: Mild lymphadenopathy in bilateral retrocrural regions and left paraaortic region shows no significant change. Index lymph node in the left paraaortic region measures 1.7 cm on image 62/2, without change. No new or  increased sites of lymphadenopathy identified. No evidence of pelvic lymphadenopathy. Aortic atherosclerosis. No abdominal aortic aneurysm. Reproductive:  No mass or other significant abnormality identified. Other:  None. Musculoskeletal:  No suspicious bone lesions identified. IMPRESSION: Stable mild left lower neck, retrocrural, and left paraaortic lymphadenopathy. No new or progressive disease identified. Electronically Signed   By: Earle Gell M.D.   On: 07/13/2017 13:29   Ct Abdomen Pelvis W Contrast  Result Date: 07/13/2017 CLINICAL DATA:  Followup non-Hodgkin lymphoma in right breast carcinoma. Chronic idiopathic thrombocytopenia. EXAM: CT CHEST, ABDOMEN, AND PELVIS WITH CONTRAST TECHNIQUE:  Multidetector CT imaging of the chest, abdomen and pelvis was performed following the standard protocol during bolus administration of intravenous contrast. CONTRAST:  177mL ISOVUE-300 IOPAMIDOL (ISOVUE-300) INJECTION 61% COMPARISON:  Chest CTA on 12/02/2012 and AP CT on 11/14/2015 FINDINGS: CT CHEST FINDINGS Cardiovascular: No acute findings. Aortic and coronary artery atherosclerosis. Mediastinum/Lymph Nodes: Mild lymphadenopathy in left lower neck and supraclavicular region shows no significant change. Index lymph node in left supraclavicular region measures 11 mm on image 7/2. No pathologically enlarged mediastinal or hilar lymph nodes identified. Right axillary surgical clips again seen, without evidence of axillary lymphadenopathy. No masses or pathologically enlarged lymph nodes identified. Lungs/Pleura: No pulmonary infiltrate or mass identified. No effusion present. Musculoskeletal:  No suspicious bone lesions identified. CT ABDOMEN AND PELVIS FINDINGS Hepatobiliary: No masses identified. Gallbladder is unremarkable. Pancreas:  No mass or inflammatory changes. Spleen:  Within normal limits in size and appearance. Adrenals/Urinary tract: No masses or hydronephrosis. Unremarkable unopacified urinary bladder.  Stomach/Bowel: No evidence of obstruction, inflammatory process, or abnormal fluid collections. Normal appendix visualized. Vascular/Lymphatic: Mild lymphadenopathy in bilateral retrocrural regions and left paraaortic region shows no significant change. Index lymph node in the left paraaortic region measures 1.7 cm on image 62/2, without change. No new or increased sites of lymphadenopathy identified. No evidence of pelvic lymphadenopathy. Aortic atherosclerosis. No abdominal aortic aneurysm. Reproductive:  No mass or other significant abnormality identified. Other:  None. Musculoskeletal:  No suspicious bone lesions identified. IMPRESSION: Stable mild left lower neck, retrocrural, and left paraaortic lymphadenopathy. No new or progressive disease identified. Electronically Signed   By: Earle Gell M.D.   On: 07/13/2017 13:29    All questions were answered. The patient knows to call the clinic with any problems, questions or concerns. No barriers to learning was detected.  I spent 15 minutes counseling the patient face to face. The total time spent in the appointment was 20 minutes and more than 50% was on counseling and review of test results  Heath Lark, MD 07/20/2017 2:09 PM

## 2017-07-20 NOTE — Telephone Encounter (Signed)
Need notes re faxed on this patient/ Fax [phone]. She said they were unable to read them. Thanks

## 2017-07-23 DIAGNOSIS — H903 Sensorineural hearing loss, bilateral: Secondary | ICD-10-CM | POA: Diagnosis not present

## 2017-07-23 LAB — COPPER, SERUM: COPPER: 128 ug/dL (ref 70–175)

## 2017-07-23 LAB — VITAMIN B1: Vitamin B1 (Thiamine): 37 nmol/L — ABNORMAL HIGH (ref 8–30)

## 2017-07-26 ENCOUNTER — Telehealth: Payer: Self-pay | Admitting: *Deleted

## 2017-07-26 NOTE — Telephone Encounter (Signed)
Patient notified

## 2017-07-26 NOTE — Telephone Encounter (Signed)
-----   Message from Alda Berthold, DO sent at 07/25/2017  3:34 PM EDT ----- Please notify patient lab are within normal limits.  Thank you.

## 2017-07-27 ENCOUNTER — Telehealth: Payer: Self-pay | Admitting: Hematology and Oncology

## 2017-07-27 ENCOUNTER — Encounter: Payer: Self-pay | Admitting: Hematology and Oncology

## 2017-07-27 ENCOUNTER — Inpatient Hospital Stay: Payer: Medicare Other

## 2017-07-27 ENCOUNTER — Inpatient Hospital Stay (HOSPITAL_BASED_OUTPATIENT_CLINIC_OR_DEPARTMENT_OTHER): Payer: Medicare Other | Admitting: Hematology and Oncology

## 2017-07-27 DIAGNOSIS — D693 Immune thrombocytopenic purpura: Secondary | ICD-10-CM

## 2017-07-27 DIAGNOSIS — Z853 Personal history of malignant neoplasm of breast: Secondary | ICD-10-CM

## 2017-07-27 DIAGNOSIS — Z79899 Other long term (current) drug therapy: Secondary | ICD-10-CM | POA: Diagnosis not present

## 2017-07-27 DIAGNOSIS — Z8572 Personal history of non-Hodgkin lymphomas: Secondary | ICD-10-CM

## 2017-07-27 DIAGNOSIS — Z7982 Long term (current) use of aspirin: Secondary | ICD-10-CM

## 2017-07-27 DIAGNOSIS — D638 Anemia in other chronic diseases classified elsewhere: Secondary | ICD-10-CM | POA: Diagnosis not present

## 2017-07-27 LAB — CBC WITH DIFFERENTIAL/PLATELET
BASOS ABS: 0.1 10*3/uL (ref 0.0–0.1)
Basophils Relative: 1 %
Eosinophils Absolute: 0.1 10*3/uL (ref 0.0–0.5)
Eosinophils Relative: 2 %
HEMATOCRIT: 35.5 % (ref 34.8–46.6)
HEMOGLOBIN: 11.5 g/dL — AB (ref 11.6–15.9)
LYMPHS PCT: 20 %
Lymphs Abs: 1.3 10*3/uL (ref 0.9–3.3)
MCH: 27.6 pg (ref 25.1–34.0)
MCHC: 32.4 g/dL (ref 31.5–36.0)
MCV: 85 fL (ref 79.5–101.0)
MONOS PCT: 8 %
Monocytes Absolute: 0.5 10*3/uL (ref 0.1–0.9)
NEUTROS ABS: 4.7 10*3/uL (ref 1.5–6.5)
NEUTROS PCT: 69 %
Platelets: 68 10*3/uL — ABNORMAL LOW (ref 145–400)
RBC: 4.17 MIL/uL (ref 3.70–5.45)
RDW: 13.4 % (ref 11.2–14.5)
WBC: 6.7 10*3/uL (ref 3.9–10.3)

## 2017-07-27 NOTE — Assessment & Plan Note (Signed)
She has mild anemia chronic disease, overall improving She is not symptomatic Observe only.

## 2017-07-27 NOTE — Assessment & Plan Note (Signed)
Her platelet count is stable She is not symptomatic We discussed the risk and benefit of prednisone therapy For now, I plan to see her back in 2 weeks for further assessment She will continue aspirin therapy as long as her platelet count is above 50,000

## 2017-07-27 NOTE — Progress Notes (Signed)
[place] OFFICE PROGRESS NOTE  Patient Care Team: Thressa Sheller, MD as PCP - General (Internal Medicine)  ASSESSMENT & PLAN:  Chronic ITP (idiopathic thrombocytopenia) (HCC) Her platelet count is stable She is not symptomatic We discussed the risk and benefit of prednisone therapy For now, I plan to see her back in 2 weeks for further assessment She will continue aspirin therapy as long as her platelet count is above 50,000  Anemia, chronic disease She has mild anemia chronic disease, overall improving She is not symptomatic Observe only.   No orders of the defined types were placed in this encounter.   INTERVAL HISTORY: Please see below for problem oriented charting. She returns for further follow-up She denies recent infection The patient denies any recent signs or symptoms of bleeding such as spontaneous epistaxis, hematuria or hematochezia. Her appetite is stable.  SUMMARY OF ONCOLOGIC HISTORY:  Darlene [name] was transferred to my care after her prior physician has left.  I reviewed the patient's records extensive and collaborated the history with the patient. 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 She never required any systemic treatment. 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 This has also resolved on its own. She has never developed any additional adenopathy other than the single lymph node palpable in the left supraclavicular fossa.  She did develop a second primary stage I,  0.8 cm,  ER negative cancer of the right breast, treated with lumpectomy, radiation and six cycles of CMF chemotherapy in 1994. No evidence of a new disease from the breast cancer either. Most recent mammogram done in June 2015 showed stable  fibroglandular changes in her breasts. She was called back for an ultrasound of the left breast which also came back benign. She had a CT scan of the abdomen done on 11/14/15 because of significant intentional weight loss and abdominal discomfort.  Imaging studies show no evidence of lymphoma.  Moderate stool burden was noted. Repeat CT scan in March 2019 show stable lymphadenopathy with no signs or symptoms of cancer recurrence  REVIEW OF SYSTEMS:   Constitutional: Denies fevers, chills or abnormal weight loss Eyes: Denies blurriness of vision Ears, nose, mouth, throat, and face: Denies mucositis or sore throat Respiratory: Denies cough, dyspnea or wheezes Cardiovascular: Denies palpitation, chest discomfort or lower extremity swelling Gastrointestinal:  Denies nausea, heartburn or change in bowel habits Skin: Denies abnormal skin rashes Lymphatics: Denies new lymphadenopathy or easy bruising Neurological:Denies numbness, tingling or new weaknesses Behavioral/Psych: Mood is stable, no new changes  All other systems were reviewed with the patient and are negative.  I have reviewed the past medical history, past surgical history, social history and family history with the patient and they are unchanged from previous note.  ALLERGIES:  is allergic to oxycodone; codeine; doxycycline; meperidine and related; sulfa antibiotics; and sulfacetamide sodium.  MEDICATIONS:  Current Outpatient Medications  Medication Sig Dispense Refill  . acetaminophen (TYLENOL) 500 MG tablet Take 1 tablet (500 mg total) by mouth every 6 (six) hours as needed for mild pain. 30 tablet 0  . aspirin 81 MG tablet Take 81 mg by mouth daily.    Marland Kitchen buPROPion (WELLBUTRIN XL) 150 MG 24 hr tablet Take 150 mg by mouth daily.    . calcium carbonate (OSCAL) 1500 (600 Ca) MG TABS tablet  Take 600 mg of elemental calcium by mouth 2 (two) times daily with a meal.    . cholecalciferol (VITAMIN D) 1000 UNITS tablet Take 1,000 Units by  mouth daily.    . citalopram (CELEXA) 40 MG tablet Take 40 mg by mouth daily.    . Cyanocobalamin (VITAMIN B-12 PO) Take 1,000 mcg by mouth daily.    Marland Kitchen esomeprazole (NEXIUM) 40 MG capsule Take 40 mg by mouth 2 (two) times daily before a meal.     . famciclovir (FAMVIR) 125 MG tablet Take 250 mg by mouth 2 (two) times daily as needed. For 5 days as needed for outbreak    . L-Methylfolate-Algae-B12-B6 (METANX) 3-90.314-2-35 MG CAPS Take 1 tablet by mouth 2 (two) times daily.    Marland Kitchen levothyroxine (SYNTHROID, LEVOTHROID) 75 MCG tablet Take 75 mcg by mouth daily.      Marland Kitchen lovastatin (MEVACOR) 20 MG tablet Take 20 mg by mouth at bedtime.      Marland Kitchen LYRICA 50 MG capsule Take 50 mg by mouth 2 (two) times daily.     . magnesium oxide (MAG-OX) 400 MG tablet Take 400 mg by mouth daily.    . metoprolol succinate (TOPROL-XL) 25 MG 24 hr tablet Take 25 mg by mouth daily.     . Multiple Vitamins-Minerals (MULTIVITAMINS THER. W/MINERALS) TABS Take 1 tablet by mouth daily.      Vladimir Faster Glycol-Propyl Glycol (SYSTANE) 0.4-0.3 % SOLN Apply 2 drops to eye 2 (two) times daily as needed (for dry eyes).    . Vitamin D, Ergocalciferol, (DRISDOL) 50000 UNITS CAPS Take 50,000 Units by mouth every 14 (fourteen) days.       No current facility-administered medications for this visit.     PHYSICAL EXAMINATION: ECOG PERFORMANCE STATUS: 0 - Asymptomatic  Vitals:   07/27/17 1301  BP: 122/67  Pulse: 67  Resp: 18  Temp: (!) 97.5 F (36.4 C)  SpO2: 98%   Filed Weights   07/27/17 1301  Weight: 171 lb 14.4 oz (78 kg)    GENERAL:alert, no distress and comfortable NEURO: alert & oriented x 3 with fluent speech, no focal motor/sensory deficits  LABORATORY DATA:  I have reviewed the data as listed    Component Value Date/Time   NA 137 07/08/2017 1053   NA 135 (L) 07/07/2016 1035   K 4.7 07/08/2017 1053   K 4.8 07/07/2016 1035   CL 104 07/08/2017 1053   CL 106 07/11/2012 0906   CO2 26 07/08/2017 1053   CO2 24  07/07/2016 1035   GLUCOSE 98 07/08/2017 1053   GLUCOSE 85 07/07/2016 1035   GLUCOSE 107 (H) 07/11/2012 0906   BUN 15 07/08/2017 1053   BUN 19.9 07/07/2016 1035   CREATININE 0.79 07/08/2017 1053   CREATININE 0.7 07/07/2016 1035   CALCIUM 10.4 07/08/2017 1053   CALCIUM 10.2 07/07/2016 1035   PROT 6.9 07/08/2017 1053   PROT 6.6 07/07/2016 1035   ALBUMIN 3.9 07/08/2017 1053   ALBUMIN 3.9 07/07/2016 1035   AST 14 07/08/2017 1053   AST 14 07/07/2016 1035   ALT 19 07/08/2017 1053   ALT 17 07/07/2016 1035   ALKPHOS 60 07/08/2017 1053   ALKPHOS 58 07/07/2016 1035   BILITOT 0.7 07/08/2017 1053   BILITOT 0.61 07/07/2016 1035   GFRNONAA >60 07/08/2017 1053   GFRAA >60 07/08/2017 1053    No results found for: SPEP, UPEP  Lab Results  Component Value Date   WBC 6.7 07/27/2017   NEUTROABS 4.7 07/27/2017  HGB 11.5 (L) 07/27/2017   HCT 35.5 07/27/2017   MCV 85.0 07/27/2017   PLT 68 (L) 07/27/2017      Chemistry      Component Value Date/Time   NA 137 07/08/2017 1053   NA 135 (L) 07/07/2016 1035   K 4.7 07/08/2017 1053   K 4.8 07/07/2016 1035   CL 104 07/08/2017 1053   CL 106 07/11/2012 0906   CO2 26 07/08/2017 1053   CO2 24 07/07/2016 1035   BUN 15 07/08/2017 1053   BUN 19.9 07/07/2016 1035   CREATININE 0.79 07/08/2017 1053   CREATININE 0.7 07/07/2016 1035      Component Value Date/Time   CALCIUM 10.4 07/08/2017 1053   CALCIUM 10.2 07/07/2016 1035   ALKPHOS 60 07/08/2017 1053   ALKPHOS 58 07/07/2016 1035   AST 14 07/08/2017 1053   AST 14 07/07/2016 1035   ALT 19 07/08/2017 1053   ALT 17 07/07/2016 1035   BILITOT 0.7 07/08/2017 1053   BILITOT 0.61 07/07/2016 1035       RADIOGRAPHIC STUDIES: I have personally reviewed the radiological images as listed and agreed with the findings in the report. Ct Chest W Contrast  Result Date: 07/13/2017 CLINICAL DATA:  Followup non-Hodgkin lymphoma in right breast carcinoma. Chronic idiopathic thrombocytopenia. EXAM: CT CHEST,  ABDOMEN, AND PELVIS WITH CONTRAST TECHNIQUE: Multidetector CT imaging of the chest, abdomen and pelvis was performed following the standard protocol during bolus administration of intravenous contrast. CONTRAST:  11mL ISOVUE-300 IOPAMIDOL (ISOVUE-300) INJECTION 61% COMPARISON:  Chest CTA on 12/02/2012 and AP CT on 11/14/2015 FINDINGS: CT CHEST FINDINGS Cardiovascular: No acute findings. Aortic and coronary artery atherosclerosis. Mediastinum/Lymph Nodes: Mild lymphadenopathy in left lower neck and supraclavicular region shows no significant change. Index lymph node in left supraclavicular region measures 11 mm on image 7/2. No pathologically enlarged mediastinal or hilar lymph nodes identified. Right axillary surgical clips again seen, without evidence of axillary lymphadenopathy. No masses or pathologically enlarged lymph nodes identified. Lungs/Pleura: No pulmonary infiltrate or mass identified. No effusion present. Musculoskeletal:  No suspicious bone lesions identified. CT ABDOMEN AND PELVIS FINDINGS Hepatobiliary: No masses identified. Gallbladder is unremarkable. Pancreas:  No mass or inflammatory changes. Spleen:  Within normal limits in size and appearance. Adrenals/Urinary tract: No masses or hydronephrosis. Unremarkable unopacified urinary bladder. Stomach/Bowel: No evidence of obstruction, inflammatory process, or abnormal fluid collections. Normal appendix visualized. Vascular/Lymphatic: Mild lymphadenopathy in bilateral retrocrural regions and left paraaortic region shows no significant change. Index lymph node in the left paraaortic region measures 1.7 cm on image 62/2, without change. No new or increased sites of lymphadenopathy identified. No evidence of pelvic lymphadenopathy. Aortic atherosclerosis. No abdominal aortic aneurysm. Reproductive:  No mass or other significant abnormality identified. Other:  None. Musculoskeletal:  No suspicious bone lesions identified. IMPRESSION: Stable mild left  lower neck, retrocrural, and left paraaortic lymphadenopathy. No new or progressive disease identified. Electronically Signed   By: Earle Gell M.D.   On: 07/13/2017 13:29   Ct Abdomen Pelvis W Contrast  Result Date: 07/13/2017 CLINICAL DATA:  Followup non-Hodgkin lymphoma in right breast carcinoma. Chronic idiopathic thrombocytopenia. EXAM: CT CHEST, ABDOMEN, AND PELVIS WITH CONTRAST TECHNIQUE: Multidetector CT imaging of the chest, abdomen and pelvis was performed following the standard protocol during bolus administration of intravenous contrast. CONTRAST:  161mL ISOVUE-300 IOPAMIDOL (ISOVUE-300) INJECTION 61% COMPARISON:  Chest CTA on 12/02/2012 and AP CT on 11/14/2015 FINDINGS: CT CHEST FINDINGS Cardiovascular: No acute findings. Aortic and coronary artery atherosclerosis. Mediastinum/Lymph Nodes: Mild lymphadenopathy  in left lower neck and supraclavicular region shows no significant change. Index lymph node in left supraclavicular region measures 11 mm on image 7/2. No pathologically enlarged mediastinal or hilar lymph nodes identified. Right axillary surgical clips again seen, without evidence of axillary lymphadenopathy. No masses or pathologically enlarged lymph nodes identified. Lungs/Pleura: No pulmonary infiltrate or mass identified. No effusion present. Musculoskeletal:  No suspicious bone lesions identified. CT ABDOMEN AND PELVIS FINDINGS Hepatobiliary: No masses identified. Gallbladder is unremarkable. Pancreas:  No mass or inflammatory changes. Spleen:  Within normal limits in size and appearance. Adrenals/Urinary tract: No masses or hydronephrosis. Unremarkable unopacified urinary bladder. Stomach/Bowel: No evidence of obstruction, inflammatory process, or abnormal fluid collections. Normal appendix visualized. Vascular/Lymphatic: Mild lymphadenopathy in bilateral retrocrural regions and left paraaortic region shows no significant change. Index lymph node in the left paraaortic region measures  1.7 cm on image 62/2, without change. No new or increased sites of lymphadenopathy identified. No evidence of pelvic lymphadenopathy. Aortic atherosclerosis. No abdominal aortic aneurysm. Reproductive:  No mass or other significant abnormality identified. Other:  None. Musculoskeletal:  No suspicious bone lesions identified. IMPRESSION: Stable mild left lower neck, retrocrural, and left paraaortic lymphadenopathy. No new or progressive disease identified. Electronically Signed   By: Earle Gell M.D.   On: 07/13/2017 13:29    All questions were answered. The patient knows to call the clinic with any problems, questions or concerns. No barriers to learning was detected.  I spent 10 minutes counseling the patient face to face. The total time spent in the appointment was 15 minutes and more than 50% was on counseling and review of test results  Heath Lark, MD 07/27/2017 4:29 PM

## 2017-07-27 NOTE — Telephone Encounter (Signed)
Gave patient AVS and calendar of upcoming April appointments.  °

## 2017-07-29 DIAGNOSIS — H903 Sensorineural hearing loss, bilateral: Secondary | ICD-10-CM | POA: Diagnosis not present

## 2017-08-01 ENCOUNTER — Ambulatory Visit
Admission: RE | Admit: 2017-08-01 | Discharge: 2017-08-01 | Disposition: A | Discharge status: Home / Self Care | Payer: Medicare Other | Source: Ambulatory Visit | Attending: Neurology | Admitting: Neurology

## 2017-08-01 DIAGNOSIS — I951 Orthostatic hypotension: Secondary | ICD-10-CM

## 2017-08-01 DIAGNOSIS — M48061 Spinal stenosis, lumbar region without neurogenic claudication: Secondary | ICD-10-CM

## 2017-08-01 DIAGNOSIS — R292 Abnormal reflex: Secondary | ICD-10-CM

## 2017-08-01 DIAGNOSIS — G609 Hereditary and idiopathic neuropathy, unspecified: Secondary | ICD-10-CM

## 2017-08-04 ENCOUNTER — Telehealth: Payer: Self-pay | Admitting: *Deleted

## 2017-08-04 ENCOUNTER — Telehealth: Payer: Self-pay | Admitting: Neurology

## 2017-08-04 NOTE — Telephone Encounter (Signed)
-----   Message from Alda Berthold, DO sent at 08/03/2017  4:14 PM EDT ----- Please inform patient that her MRI lumbar spine shows advanced age related changes in her low back, which is causing some of her gait instability. Continue physical therapy, and if no improvement we can discuss if she would like to see a spine specialist at her next visit.

## 2017-08-04 NOTE — Telephone Encounter (Signed)
Left message giving patient results and instructions.   

## 2017-08-04 NOTE — Telephone Encounter (Signed)
Pt left a VM message saying she missed a call from [facility] and wondered if it was her MRI results

## 2017-08-05 NOTE — Telephone Encounter (Signed)
Attempted to contact patient.  Left another message giving her the results and instructions.

## 2017-08-09 ENCOUNTER — Ambulatory Visit: Payer: Medicare Other | Admitting: Hematology and Oncology

## 2017-08-09 ENCOUNTER — Other Ambulatory Visit: Payer: Medicare Other

## 2017-08-10 ENCOUNTER — Telehealth: Payer: Self-pay | Admitting: Hematology and Oncology

## 2017-08-10 ENCOUNTER — Encounter: Payer: Self-pay | Admitting: Hematology and Oncology

## 2017-08-10 ENCOUNTER — Inpatient Hospital Stay: Payer: Medicare Other | Attending: Hematology and Oncology

## 2017-08-10 ENCOUNTER — Inpatient Hospital Stay (HOSPITAL_BASED_OUTPATIENT_CLINIC_OR_DEPARTMENT_OTHER): Payer: Medicare Other | Admitting: Hematology and Oncology

## 2017-08-10 DIAGNOSIS — D693 Immune thrombocytopenic purpura: Secondary | ICD-10-CM

## 2017-08-10 DIAGNOSIS — G8929 Other chronic pain: Secondary | ICD-10-CM | POA: Diagnosis not present

## 2017-08-10 DIAGNOSIS — Z853 Personal history of malignant neoplasm of breast: Secondary | ICD-10-CM | POA: Diagnosis not present

## 2017-08-10 DIAGNOSIS — Z8572 Personal history of non-Hodgkin lymphomas: Secondary | ICD-10-CM | POA: Diagnosis not present

## 2017-08-10 DIAGNOSIS — Z7982 Long term (current) use of aspirin: Secondary | ICD-10-CM | POA: Diagnosis not present

## 2017-08-10 DIAGNOSIS — Z79899 Other long term (current) drug therapy: Secondary | ICD-10-CM | POA: Insufficient documentation

## 2017-08-10 LAB — CBC WITH DIFFERENTIAL/PLATELET
Basophils Absolute: 0.1 10*3/uL (ref 0.0–0.1)
Basophils Relative: 1 %
EOS PCT: 3 %
Eosinophils Absolute: 0.2 10*3/uL (ref 0.0–0.5)
HCT: 36.3 % (ref 34.8–46.6)
Hemoglobin: 11.8 g/dL (ref 11.6–15.9)
LYMPHS ABS: 1.5 10*3/uL (ref 0.9–3.3)
LYMPHS PCT: 21 %
MCH: 28.2 pg (ref 25.1–34.0)
MCHC: 32.5 g/dL (ref 31.5–36.0)
MCV: 86.6 fL (ref 79.5–101.0)
MONO ABS: 0.4 10*3/uL (ref 0.1–0.9)
MONOS PCT: 6 %
Neutro Abs: 5.1 10*3/uL (ref 1.5–6.5)
Neutrophils Relative %: 69 %
PLATELETS: 78 10*3/uL — AB (ref 145–400)
RBC: 4.19 MIL/uL (ref 3.70–5.45)
RDW: 13.5 % (ref 11.2–14.5)
WBC: 7.2 10*3/uL (ref 3.9–10.3)

## 2017-08-10 NOTE — Assessment & Plan Note (Signed)
Her platelet count is stable and slowly improving She is not symptomatic I plan to recheck again in 3 months The patient is educated to watch out for signs and symptoms of bleeding or excessive bruising She will continue aspirin therapy as long as her platelet count is above 50,000

## 2017-08-10 NOTE — Progress Notes (Signed)
[place] OFFICE PROGRESS NOTE  Patient Care Team: Thressa Sheller, MD as PCP - General (Internal Medicine)  ASSESSMENT & PLAN:  History of breast cancer in female CT scan showed no evidence of cancer recurrence.  She will continue screening mammogram, due in June 2019  History of non-Hodgkin's lymphoma Recent CT imaging showed stable lymphadenopathy but no change I would not recommend biopsy and it is not likely due to recurrence of lymphoma  Chronic ITP (idiopathic thrombocytopenia) (HCC) Her platelet count is stable and slowly improving She is not symptomatic I plan to recheck again in 3 months The patient is educated to watch out for signs and symptoms of bleeding or excessive bruising She will continue aspirin therapy as long as her platelet count is above 50,000   No orders of the defined types were placed in this encounter.   INTERVAL HISTORY: Please see below for problem oriented charting. She returns with her daughter for further follow-up She has minor bruising The patient denies any recent signs or symptoms of bleeding such as spontaneous epistaxis, hematuria or hematochezia. She has no new skin lesions except for some seborrheic keratosis Denies recent signs or symptoms of infection such as fever, chills or cough No progression of lymphadenopathy  SUMMARY OF ONCOLOGIC HISTORY:  Darlene [name] was transferred to my care after her prior physician has left.  I reviewed the patient's records extensive and collaborated the history with the patient. 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 She never required any systemic treatment. 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 This has also resolved on its own. She has never developed  any additional adenopathy other than the single lymph node palpable in the left supraclavicular fossa.  She did develop a second primary stage I,  0.8 cm,  ER negative cancer of the right breast, treated with lumpectomy, radiation and six cycles of CMF chemotherapy in 1994. No evidence of a new disease from the breast cancer either. Most recent mammogram done in June 2015 showed stable fibroglandular changes in her breasts. She was called back for an ultrasound of the left breast which also came back benign. She had a CT scan of the abdomen done on 11/14/15 because of significant intentional weight loss and abdominal discomfort.  Imaging studies show no evidence of lymphoma.  Moderate stool burden was noted. Repeat CT scan in March 2019 show stable lymphadenopathy with no signs or symptoms of cancer recurrence  REVIEW OF SYSTEMS:   Constitutional: Denies fevers, chills or abnormal weight loss Eyes: Denies blurriness of vision Ears, nose, mouth, throat, and face: Denies mucositis or sore throat Respiratory: Denies cough, dyspnea or wheezes Cardiovascular: Denies palpitation, chest discomfort or lower extremity swelling Gastrointestinal:  Denies nausea, heartburn or change in bowel habits Skin: Denies abnormal skin rashes Lymphatics: Denies new lymphadenopathy or easy bruising Neurological:Denies numbness, tingling or new weaknesses Behavioral/Psych: Mood is stable, no new changes  All other systems were reviewed with the patient and are negative.  I have reviewed the past medical history, past surgical history, social history and family history with the patient and they are unchanged from previous note.  ALLERGIES:  is allergic to oxycodone; codeine; doxycycline; meperidine and related; sulfa antibiotics; and sulfacetamide sodium.  MEDICATIONS:  Current Outpatient Medications  Medication Sig Dispense Refill  . acetaminophen (  TYLENOL) 500 MG tablet Take 1 tablet (500 mg total) by mouth every 6  (six) hours as needed for mild pain. 30 tablet 0  . aspirin 81 MG tablet Take 81 mg by mouth daily.    Marland Kitchen buPROPion (WELLBUTRIN XL) 150 MG 24 hr tablet Take 150 mg by mouth daily.    . calcium carbonate (OSCAL) 1500 (600 Ca) MG TABS tablet Take 600 mg of elemental calcium by mouth 2 (two) times daily with a meal.    . cholecalciferol (VITAMIN D) 1000 UNITS tablet Take 1,000 Units by mouth daily.    . citalopram (CELEXA) 40 MG tablet Take 40 mg by mouth daily.    . Cyanocobalamin (VITAMIN B-12 PO) Take 1,000 mcg by mouth daily.    Marland Kitchen esomeprazole (NEXIUM) 40 MG capsule Take 40 mg by mouth 2 (two) times daily before a meal.     . famciclovir (FAMVIR) 125 MG tablet Take 250 mg by mouth 2 (two) times daily as needed. For 5 days as needed for outbreak    . L-Methylfolate-Algae-B12-B6 (METANX) 3-90.314-2-35 MG CAPS Take 1 tablet by mouth 2 (two) times daily.    Marland Kitchen levothyroxine (SYNTHROID, LEVOTHROID) 75 MCG tablet Take 75 mcg by mouth daily.      Marland Kitchen lovastatin (MEVACOR) 20 MG tablet Take 20 mg by mouth at bedtime.      Marland Kitchen LYRICA 50 MG capsule Take 50 mg by mouth 2 (two) times daily.     . magnesium oxide (MAG-OX) 400 MG tablet Take 400 mg by mouth daily.    . metoprolol succinate (TOPROL-XL) 25 MG 24 hr tablet Take 25 mg by mouth daily.     . Multiple Vitamins-Minerals (MULTIVITAMINS THER. W/MINERALS) TABS Take 1 tablet by mouth daily.      Vladimir Faster Glycol-Propyl Glycol (SYSTANE) 0.4-0.3 % SOLN Apply 2 drops to eye 2 (two) times daily as needed (for dry eyes).    . Vitamin D, Ergocalciferol, (DRISDOL) 50000 UNITS CAPS Take 50,000 Units by mouth every 14 (fourteen) days.       No current facility-administered medications for this visit.     PHYSICAL EXAMINATION: ECOG PERFORMANCE STATUS: 0 - Asymptomatic  Vitals:   08/10/17 1424  BP: 127/69  Pulse: 62  Resp: 18  Temp: 97.8 F (36.6 C)  SpO2: 97%   Filed Weights   08/10/17 1424  Weight: 173 lb 9.6 oz (78.7 kg)    GENERAL:alert, no  distress and comfortable SKIN: Noted minor skin bruising.  Noted some seborrheic keratosis which look benign NEURO: alert & oriented x 3 with fluent speech, no focal motor/sensory deficits  LABORATORY DATA:  I have reviewed the data as listed    Component Value Date/Time   NA 137 07/08/2017 1053   NA 135 (L) 07/07/2016 1035   K 4.7 07/08/2017 1053   K 4.8 07/07/2016 1035   CL 104 07/08/2017 1053   CL 106 07/11/2012 0906   CO2 26 07/08/2017 1053   CO2 24 07/07/2016 1035   GLUCOSE 98 07/08/2017 1053   GLUCOSE 85 07/07/2016 1035   GLUCOSE 107 (H) 07/11/2012 0906   BUN 15 07/08/2017 1053   BUN 19.9 07/07/2016 1035   CREATININE 0.79 07/08/2017 1053   CREATININE 0.7 07/07/2016 1035   CALCIUM 10.4 07/08/2017 1053   CALCIUM 10.2 07/07/2016 1035   PROT 6.9 07/08/2017 1053   PROT 6.6 07/07/2016 1035   ALBUMIN 3.9 07/08/2017 1053   ALBUMIN 3.9 07/07/2016 1035   AST 14 07/08/2017 1053  AST 14 07/07/2016 1035   ALT 19 07/08/2017 1053   ALT 17 07/07/2016 1035   ALKPHOS 60 07/08/2017 1053   ALKPHOS 58 07/07/2016 1035   BILITOT 0.7 07/08/2017 1053   BILITOT 0.61 07/07/2016 1035   GFRNONAA >60 07/08/2017 1053   GFRAA >60 07/08/2017 1053    No results found for: SPEP, UPEP  Lab Results  Component Value Date   WBC 7.2 08/10/2017   NEUTROABS 5.1 08/10/2017   HGB 11.8 08/10/2017   HCT 36.3 08/10/2017   MCV 86.6 08/10/2017   PLT 78 (L) 08/10/2017      Chemistry      Component Value Date/Time   NA 137 07/08/2017 1053   NA 135 (L) 07/07/2016 1035   K 4.7 07/08/2017 1053   K 4.8 07/07/2016 1035   CL 104 07/08/2017 1053   CL 106 07/11/2012 0906   CO2 26 07/08/2017 1053   CO2 24 07/07/2016 1035   BUN 15 07/08/2017 1053   BUN 19.9 07/07/2016 1035   CREATININE 0.79 07/08/2017 1053   CREATININE 0.7 07/07/2016 1035      Component Value Date/Time   CALCIUM 10.4 07/08/2017 1053   CALCIUM 10.2 07/07/2016 1035   ALKPHOS 60 07/08/2017 1053   ALKPHOS 58 07/07/2016 1035   AST 14  07/08/2017 1053   AST 14 07/07/2016 1035   ALT 19 07/08/2017 1053   ALT 17 07/07/2016 1035   BILITOT 0.7 07/08/2017 1053   BILITOT 0.61 07/07/2016 1035       RADIOGRAPHIC STUDIES: I have personally reviewed the radiological images as listed and agreed with the findings in the report. Ct Chest W Contrast  Result Date: 07/13/2017 CLINICAL DATA:  Followup non-Hodgkin lymphoma in right breast carcinoma. Chronic idiopathic thrombocytopenia. EXAM: CT CHEST, ABDOMEN, AND PELVIS WITH CONTRAST TECHNIQUE: Multidetector CT imaging of the chest, abdomen and pelvis was performed following the standard protocol during bolus administration of intravenous contrast. CONTRAST:  134mL ISOVUE-300 IOPAMIDOL (ISOVUE-300) INJECTION 61% COMPARISON:  Chest CTA on 12/02/2012 and AP CT on 11/14/2015 FINDINGS: CT CHEST FINDINGS Cardiovascular: No acute findings. Aortic and coronary artery atherosclerosis. Mediastinum/Lymph Nodes: Mild lymphadenopathy in left lower neck and supraclavicular region shows no significant change. Index lymph node in left supraclavicular region measures 11 mm on image 7/2. No pathologically enlarged mediastinal or hilar lymph nodes identified. Right axillary surgical clips again seen, without evidence of axillary lymphadenopathy. No masses or pathologically enlarged lymph nodes identified. Lungs/Pleura: No pulmonary infiltrate or mass identified. No effusion present. Musculoskeletal:  No suspicious bone lesions identified. CT ABDOMEN AND PELVIS FINDINGS Hepatobiliary: No masses identified. Gallbladder is unremarkable. Pancreas:  No mass or inflammatory changes. Spleen:  Within normal limits in size and appearance. Adrenals/Urinary tract: No masses or hydronephrosis. Unremarkable unopacified urinary bladder. Stomach/Bowel: No evidence of obstruction, inflammatory process, or abnormal fluid collections. Normal appendix visualized. Vascular/Lymphatic: Mild lymphadenopathy in bilateral retrocrural regions  and left paraaortic region shows no significant change. Index lymph node in the left paraaortic region measures 1.7 cm on image 62/2, without change. No new or increased sites of lymphadenopathy identified. No evidence of pelvic lymphadenopathy. Aortic atherosclerosis. No abdominal aortic aneurysm. Reproductive:  No mass or other significant abnormality identified. Other:  None. Musculoskeletal:  No suspicious bone lesions identified. IMPRESSION: Stable mild left lower neck, retrocrural, and left paraaortic lymphadenopathy. No new or progressive disease identified. Electronically Signed   By: Earle Gell M.D.   On: 07/13/2017 13:29   Mr Lumbar Spine Wo Contrast  Result Date: 08/01/2017  CLINICAL DATA:  Worsening chronic low back pain. Imbalance. Hyperreflexia. History of lymphoma. EXAM: MRI LUMBAR SPINE WITHOUT CONTRAST TECHNIQUE: Multiplanar, multisequence MR imaging of the lumbar spine was performed. No intravenous contrast was administered. COMPARISON:  CT abdomen and pelvis 07/13/2017 FINDINGS: Segmentation:  Standard. Alignment:  Grade 1 anterolisthesis of L4 on L5 and L5 on S1. Vertebrae: L3, L4, and L5 Schmorl's nodes. Mild degenerative endplate edema at A6-3 and L5-S1. L2 and S1 hemangiomas. No suspicious osseous lesion. Conus medullaris and cauda equina: Conus extends to the L1-2 level. Conus and cauda equina appear normal. Paraspinal and other soft tissues: Unremarkable. Disc levels: Disc desiccation throughout the lumbar spine. Severe disc space narrowing at L4-5 with moderate narrowing at L2-3 and milder narrowing at L3-4 and L5-S1. T12-L1: Negative. L1-2: Minimal disc bulging and mild ligamentum flavum hypertrophy without stenosis. L2-3: Mild circumferential disc bulging, small right foraminal to extraforaminal disc protrusion, and mild facet and ligamentum flavum hypertrophy result in mild spinal stenosis and borderline right neural foraminal stenosis. The disc protrusion may affect the  extraforaminal right L2 nerve. L3-4: Circumferential disc bulging, congenitally short pedicles, and moderate facet and ligamentum flavum hypertrophy result in moderate spinal stenosis without neural foraminal stenosis. L4-5: Anterolisthesis with disc uncovering, ligamentum flavum thickening, and severe facet arthrosis result in minimal right neural foraminal stenosis without spinal stenosis. L5-S1: Anterolisthesis with mild bulging of uncovered disc and moderate to severe facet and ligamentum flavum hypertrophy result in borderline left lateral recess stenosis and severe left neural foraminal stenosis with potential left L5 nerve root impingement. No spinal stenosis. Small facet joint effusions. IMPRESSION: 1. Advanced lumbar disc and facet degeneration with moderate multifactorial spinal stenosis at L3-4 and mild spinal stenosis at L2-3. 2. Severe left neural foraminal stenosis at L5-S1. 3. Small right-sided disc protrusion at L2-3 potentially affecting the extraforaminal L2 nerve. Electronically Signed   By: Logan Bores M.D.   On: 08/01/2017 15:32   Ct Abdomen Pelvis W Contrast  Result Date: 07/13/2017 CLINICAL DATA:  Followup non-Hodgkin lymphoma in right breast carcinoma. Chronic idiopathic thrombocytopenia. EXAM: CT CHEST, ABDOMEN, AND PELVIS WITH CONTRAST TECHNIQUE: Multidetector CT imaging of the chest, abdomen and pelvis was performed following the standard protocol during bolus administration of intravenous contrast. CONTRAST:  1102mL ISOVUE-300 IOPAMIDOL (ISOVUE-300) INJECTION 61% COMPARISON:  Chest CTA on 12/02/2012 and AP CT on 11/14/2015 FINDINGS: CT CHEST FINDINGS Cardiovascular: No acute findings. Aortic and coronary artery atherosclerosis. Mediastinum/Lymph Nodes: Mild lymphadenopathy in left lower neck and supraclavicular region shows no significant change. Index lymph node in left supraclavicular region measures 11 mm on image 7/2. No pathologically enlarged mediastinal or hilar lymph nodes  identified. Right axillary surgical clips again seen, without evidence of axillary lymphadenopathy. No masses or pathologically enlarged lymph nodes identified. Lungs/Pleura: No pulmonary infiltrate or mass identified. No effusion present. Musculoskeletal:  No suspicious bone lesions identified. CT ABDOMEN AND PELVIS FINDINGS Hepatobiliary: No masses identified. Gallbladder is unremarkable. Pancreas:  No mass or inflammatory changes. Spleen:  Within normal limits in size and appearance. Adrenals/Urinary tract: No masses or hydronephrosis. Unremarkable unopacified urinary bladder. Stomach/Bowel: No evidence of obstruction, inflammatory process, or abnormal fluid collections. Normal appendix visualized. Vascular/Lymphatic: Mild lymphadenopathy in bilateral retrocrural regions and left paraaortic region shows no significant change. Index lymph node in the left paraaortic region measures 1.7 cm on image 62/2, without change. No new or increased sites of lymphadenopathy identified. No evidence of pelvic lymphadenopathy. Aortic atherosclerosis. No abdominal aortic aneurysm. Reproductive:  No mass or other significant abnormality  identified. Other:  None. Musculoskeletal:  No suspicious bone lesions identified. IMPRESSION: Stable mild left lower neck, retrocrural, and left paraaortic lymphadenopathy. No new or progressive disease identified. Electronically Signed   By: Earle Gell M.D.   On: 07/13/2017 13:29    All questions were answered. The patient knows to call the clinic with any problems, questions or concerns. No barriers to learning was detected.  I spent 10 minutes counseling the patient face to face. The total time spent in the appointment was 15 minutes and more than 50% was on counseling and review of test results  Heath Lark, MD 08/10/2017 3:01 PM

## 2017-08-10 NOTE — Assessment & Plan Note (Signed)
Recent CT imaging showed stable lymphadenopathy but no change I would not recommend biopsy and it is not likely due to recurrence of lymphoma

## 2017-08-10 NOTE — Assessment & Plan Note (Signed)
CT scan showed no evidence of cancer recurrence.  She will continue screening mammogram, due in June 2019

## 2017-08-10 NOTE — Telephone Encounter (Signed)
Gave patient AVS and calendar of upcoming July appointments.  °

## 2017-08-13 ENCOUNTER — Other Ambulatory Visit: Payer: Self-pay | Admitting: Hematology and Oncology

## 2017-08-13 ENCOUNTER — Encounter: Payer: Self-pay | Admitting: Podiatry

## 2017-08-13 ENCOUNTER — Ambulatory Visit (INDEPENDENT_AMBULATORY_CARE_PROVIDER_SITE_OTHER): Payer: Medicare Other | Admitting: Podiatry

## 2017-08-13 DIAGNOSIS — Z1231 Encounter for screening mammogram for malignant neoplasm of breast: Secondary | ICD-10-CM

## 2017-08-13 DIAGNOSIS — M79676 Pain in unspecified toe(s): Secondary | ICD-10-CM | POA: Diagnosis not present

## 2017-08-13 DIAGNOSIS — B351 Tinea unguium: Secondary | ICD-10-CM | POA: Diagnosis not present

## 2017-08-13 NOTE — Progress Notes (Signed)
Patient ID: Darlene [name], female   DOB: Feb 13, 1935, 83 y.o.   MRN: [id number] Complaint:  Visit Type: Patient returns to my office for continued preventative foot care services. Complaint: Patient states" my nails have grown long and thick and become painful to walk and wear shoes".  He presents for preventative foot care services. No changes to ROS  Podiatric Exam: Vascular: dorsalis pedis and posterior tibial pulses are palpable bilateral. Capillary return is immediate. Temperature gradient is WNL. Skin turgor WNL  Sensorium: Normal Semmes Weinstein monofilament test. Normal tactile sensation bilaterally. Nail Exam: Pt has thick disfigured discolored nails with subungual debris noted bilateral entire nail hallux through fifth toenails Ulcer Exam: There is no evidence of ulcer or pre-ulcerative changes or infection. Orthopedic Exam: Muscle tone and strength are WNL. No limitations in general ROM. No crepitus or effusions noted. Foot type and digits show no abnormalities. Bony prominences are unremarkable. Asymptomatic adducto varus fifth digits. Skin: No Porokeratosis. No infection or ulcers.  Diagnosis:  Tinea unguium, Pain in right toe, pain in left toes  Treatment & Plan Procedures and Treatment: Consent by patient was obtained for treatment procedures. The patient understood the discussion of treatment and procedures well. All questions were answered thoroughly reviewed. Debridement of mycotic and hypertrophic toenails, 1 through 5 bilateral and clearing of subungual debris. No ulceration, no infection noted.  Return Visit-Office Procedure: Patient instructed to return to the office for a follow up visit 10 weeks  for continued evaluation and treatment.

## 2017-08-17 DIAGNOSIS — H2513 Age-related nuclear cataract, bilateral: Secondary | ICD-10-CM | POA: Diagnosis not present

## 2017-08-17 DIAGNOSIS — H5203 Hypermetropia, bilateral: Secondary | ICD-10-CM | POA: Diagnosis not present

## 2017-09-01 DIAGNOSIS — M542 Cervicalgia: Secondary | ICD-10-CM | POA: Diagnosis not present

## 2017-09-16 DIAGNOSIS — M542 Cervicalgia: Secondary | ICD-10-CM | POA: Diagnosis not present

## 2017-10-01 DIAGNOSIS — M542 Cervicalgia: Secondary | ICD-10-CM | POA: Diagnosis not present

## 2017-10-01 DIAGNOSIS — M2560 Stiffness of unspecified joint, not elsewhere classified: Secondary | ICD-10-CM | POA: Diagnosis not present

## 2017-10-01 DIAGNOSIS — M47892 Other spondylosis, cervical region: Secondary | ICD-10-CM | POA: Diagnosis not present

## 2017-10-04 DIAGNOSIS — M2560 Stiffness of unspecified joint, not elsewhere classified: Secondary | ICD-10-CM | POA: Diagnosis not present

## 2017-10-04 DIAGNOSIS — M47892 Other spondylosis, cervical region: Secondary | ICD-10-CM | POA: Diagnosis not present

## 2017-10-04 DIAGNOSIS — M542 Cervicalgia: Secondary | ICD-10-CM | POA: Diagnosis not present

## 2017-10-07 DIAGNOSIS — M542 Cervicalgia: Secondary | ICD-10-CM | POA: Diagnosis not present

## 2017-10-07 DIAGNOSIS — M2560 Stiffness of unspecified joint, not elsewhere classified: Secondary | ICD-10-CM | POA: Diagnosis not present

## 2017-10-07 DIAGNOSIS — M47892 Other spondylosis, cervical region: Secondary | ICD-10-CM | POA: Diagnosis not present

## 2017-10-11 DIAGNOSIS — M542 Cervicalgia: Secondary | ICD-10-CM | POA: Diagnosis not present

## 2017-10-11 DIAGNOSIS — M2560 Stiffness of unspecified joint, not elsewhere classified: Secondary | ICD-10-CM | POA: Diagnosis not present

## 2017-10-11 DIAGNOSIS — M47892 Other spondylosis, cervical region: Secondary | ICD-10-CM | POA: Diagnosis not present

## 2017-10-12 DIAGNOSIS — M47892 Other spondylosis, cervical region: Secondary | ICD-10-CM | POA: Diagnosis not present

## 2017-10-12 DIAGNOSIS — M2560 Stiffness of unspecified joint, not elsewhere classified: Secondary | ICD-10-CM | POA: Diagnosis not present

## 2017-10-12 DIAGNOSIS — M542 Cervicalgia: Secondary | ICD-10-CM | POA: Diagnosis not present

## 2017-10-13 ENCOUNTER — Ambulatory Visit
Admission: RE | Admit: 2017-10-13 | Discharge: 2017-10-13 | Disposition: A | Discharge status: Home / Self Care | Payer: Medicare Other | Source: Ambulatory Visit | Attending: Hematology and Oncology | Admitting: Hematology and Oncology

## 2017-10-13 ENCOUNTER — Ambulatory Visit: Payer: Medicare Other

## 2017-10-13 DIAGNOSIS — Z1231 Encounter for screening mammogram for malignant neoplasm of breast: Secondary | ICD-10-CM | POA: Diagnosis not present

## 2017-10-14 DIAGNOSIS — M2560 Stiffness of unspecified joint, not elsewhere classified: Secondary | ICD-10-CM | POA: Diagnosis not present

## 2017-10-18 DIAGNOSIS — M542 Cervicalgia: Secondary | ICD-10-CM | POA: Diagnosis not present

## 2017-10-18 DIAGNOSIS — M2560 Stiffness of unspecified joint, not elsewhere classified: Secondary | ICD-10-CM | POA: Diagnosis not present

## 2017-10-18 DIAGNOSIS — M47892 Other spondylosis, cervical region: Secondary | ICD-10-CM | POA: Diagnosis not present

## 2017-10-20 DIAGNOSIS — M47892 Other spondylosis, cervical region: Secondary | ICD-10-CM | POA: Diagnosis not present

## 2017-10-20 DIAGNOSIS — M2560 Stiffness of unspecified joint, not elsewhere classified: Secondary | ICD-10-CM | POA: Diagnosis not present

## 2017-10-20 DIAGNOSIS — M542 Cervicalgia: Secondary | ICD-10-CM | POA: Diagnosis not present

## 2017-10-22 ENCOUNTER — Ambulatory Visit (INDEPENDENT_AMBULATORY_CARE_PROVIDER_SITE_OTHER): Payer: Medicare Other | Admitting: Podiatry

## 2017-10-22 ENCOUNTER — Encounter: Payer: Self-pay | Admitting: Podiatry

## 2017-10-22 DIAGNOSIS — B351 Tinea unguium: Secondary | ICD-10-CM

## 2017-10-22 DIAGNOSIS — M79676 Pain in unspecified toe(s): Secondary | ICD-10-CM

## 2017-10-22 NOTE — Progress Notes (Signed)
Patient ID: Darlene [name], female   DOB: 11/17/1934, 83 y.o.   MRN: [id number] Complaint:  Visit Type: Patient returns to my office for continued preventative foot care services. Complaint: Patient states" my nails have grown long and thick and become painful to walk and wear shoes".  He presents for preventative foot care services. No changes to ROS  Podiatric Exam: Vascular: dorsalis pedis and posterior tibial pulses are palpable bilateral. Capillary return is immediate. Temperature gradient is WNL. Skin turgor WNL  Sensorium: Normal Semmes Weinstein monofilament test. Normal tactile sensation bilaterally. Nail Exam: Pt has thick disfigured discolored nails with subungual debris noted bilateral entire nail hallux through fifth toenails Ulcer Exam: There is no evidence of ulcer or pre-ulcerative changes or infection. Orthopedic Exam: Muscle tone and strength are WNL. No limitations in general ROM. No crepitus or effusions noted. Hallux varus 1st MPJ right with hammer toes2,3 right. Bony prominences are unremarkable. Asymptomatic adducto varus fifth digits. Skin: No Porokeratosis. No infection or ulcers.  Diagnosis:  Tinea unguium, Pain in right toe, pain in left toes  Treatment & Plan Procedures and Treatment: Consent by patient was obtained for treatment procedures. The patient understood the discussion of treatment and procedures well. All questions were answered thoroughly reviewed. Debridement of mycotic and hypertrophic toenails, 1 through 5 bilateral and clearing of subungual debris. No ulceration, no infection noted.  Return Visit-Office Procedure: Patient instructed to return to the office for a follow up visit 10 weeks  for continued evaluation and treatment. 

## 2017-10-25 DIAGNOSIS — M542 Cervicalgia: Secondary | ICD-10-CM | POA: Diagnosis not present

## 2017-10-25 DIAGNOSIS — M47892 Other spondylosis, cervical region: Secondary | ICD-10-CM | POA: Diagnosis not present

## 2017-10-25 DIAGNOSIS — M2560 Stiffness of unspecified joint, not elsewhere classified: Secondary | ICD-10-CM | POA: Diagnosis not present

## 2017-10-26 DIAGNOSIS — M542 Cervicalgia: Secondary | ICD-10-CM | POA: Diagnosis not present

## 2017-10-26 DIAGNOSIS — M2560 Stiffness of unspecified joint, not elsewhere classified: Secondary | ICD-10-CM | POA: Diagnosis not present

## 2017-10-26 DIAGNOSIS — M47892 Other spondylosis, cervical region: Secondary | ICD-10-CM | POA: Diagnosis not present

## 2017-10-29 ENCOUNTER — Emergency Department (HOSPITAL_COMMUNITY)
Admission: EM | Admit: 2017-10-29 | Discharge: 2017-10-29 | Disposition: A | Discharge status: Home / Self Care | Payer: Medicare Other | Attending: Emergency Medicine | Admitting: Emergency Medicine

## 2017-10-29 ENCOUNTER — Emergency Department (HOSPITAL_COMMUNITY): Payer: Medicare Other

## 2017-10-29 ENCOUNTER — Other Ambulatory Visit: Payer: Self-pay

## 2017-10-29 DIAGNOSIS — S300XXA Contusion of lower back and pelvis, initial encounter: Secondary | ICD-10-CM

## 2017-10-29 DIAGNOSIS — E079 Disorder of thyroid, unspecified: Secondary | ICD-10-CM | POA: Diagnosis not present

## 2017-10-29 DIAGNOSIS — Z79899 Other long term (current) drug therapy: Secondary | ICD-10-CM | POA: Diagnosis not present

## 2017-10-29 DIAGNOSIS — S3993XA Unspecified injury of pelvis, initial encounter: Secondary | ICD-10-CM | POA: Diagnosis not present

## 2017-10-29 DIAGNOSIS — W01198A Fall on same level from slipping, tripping and stumbling with subsequent striking against other object, initial encounter: Secondary | ICD-10-CM | POA: Diagnosis not present

## 2017-10-29 DIAGNOSIS — I959 Hypotension, unspecified: Secondary | ICD-10-CM | POA: Diagnosis not present

## 2017-10-29 DIAGNOSIS — Z7982 Long term (current) use of aspirin: Secondary | ICD-10-CM | POA: Diagnosis not present

## 2017-10-29 DIAGNOSIS — W19XXXA Unspecified fall, initial encounter: Secondary | ICD-10-CM | POA: Diagnosis not present

## 2017-10-29 DIAGNOSIS — J449 Chronic obstructive pulmonary disease, unspecified: Secondary | ICD-10-CM | POA: Insufficient documentation

## 2017-10-29 DIAGNOSIS — R102 Pelvic and perineal pain: Secondary | ICD-10-CM | POA: Diagnosis not present

## 2017-10-29 DIAGNOSIS — Z8572 Personal history of non-Hodgkin lymphomas: Secondary | ICD-10-CM | POA: Diagnosis not present

## 2017-10-29 DIAGNOSIS — Y9389 Activity, other specified: Secondary | ICD-10-CM | POA: Insufficient documentation

## 2017-10-29 DIAGNOSIS — Y998 Other external cause status: Secondary | ICD-10-CM | POA: Insufficient documentation

## 2017-10-29 DIAGNOSIS — Z853 Personal history of malignant neoplasm of breast: Secondary | ICD-10-CM | POA: Diagnosis not present

## 2017-10-29 DIAGNOSIS — Z87891 Personal history of nicotine dependence: Secondary | ICD-10-CM | POA: Diagnosis not present

## 2017-10-29 DIAGNOSIS — Y92238 Other place in hospital as the place of occurrence of the external cause: Secondary | ICD-10-CM | POA: Diagnosis not present

## 2017-10-29 DIAGNOSIS — S0990XA Unspecified injury of head, initial encounter: Secondary | ICD-10-CM | POA: Diagnosis not present

## 2017-10-29 DIAGNOSIS — Z96651 Presence of right artificial knee joint: Secondary | ICD-10-CM | POA: Insufficient documentation

## 2017-10-29 DIAGNOSIS — M542 Cervicalgia: Secondary | ICD-10-CM | POA: Diagnosis not present

## 2017-10-29 DIAGNOSIS — R52 Pain, unspecified: Secondary | ICD-10-CM | POA: Diagnosis not present

## 2017-10-29 NOTE — ED Triage Notes (Signed)
Pt was at vet with dog on leash when dog pulled pt causing pt to fall straight back striking head and landing on tailbone. Pt presents with swelling to right occipital. Pt c/o head pain and tailbone pain

## 2017-10-29 NOTE — ED Provider Notes (Signed)
[facility] DEPT Provider Note   CSN: [id number] Arrival date & time: 10/29/17  1539     History   Chief Complaint Chief Complaint  Patient presents with  . Fall    HPI Darlene [name] is a 83 y.o. female.  HPI Patient presents after a fall.  Darlene [name] was at the veterinarian with her dog getting something out of her wallet when the dog moved and due to her unsteadiness fell backwards landing on her buttock and hitting the back of her head.  No loss conscious.  Now complaining of pain in her head and her buttock area.  Has been amatory.  Darlene [name] is not on any anticoagulation.  Does have some mild chronic neck pain that Darlene [name] goes to therapy for.  States this does not hurt any differently than her baseline.  No extremity pain.  No lightheadedness or dizziness. Past Medical History:  Diagnosis Date  . Anxiety   . Breast cancer, stage 1, estrogen receptor negative ([place]) 07/06/2011  . Cancer of breast ([place])   . Cataract   . Chronic ITP (idiopathic thrombocytopenia) (HCC) 07/06/2011  . Constipation    takes Colace daily  . COPD (chronic obstructive pulmonary disease) (H[facility])    mild  . Depression    "nervous break down age 83"  . GERD (gastroesophageal reflux disease)    takes Nexium daily  . History of colon polyps   . History of depression    received shock treatments   . History of palpitations    takes Metoprolol daily  . History of vertigo   . HOH (hard of hearing)    has hearing aids  . Hyperlipidemia    takes Lovastatin daily  . Hypothyroid 07/06/2011   takes SYnthroid daily  . Joint pain   . Joint swelling   . Lymphoma ([place])   . Lymphoma ([place])    B cell   . Lymphoma of lymph nodes of head, face, and/or neck ([place]) 07/06/2011  . Neuropathy   . Nocturia   . Osteopenia   . Osteoporosis    hx of and take Reclast  . Peripheral neuropathy   . Personal history of chemotherapy   . Personal history of radiation therapy   . Right knee DJD   . Shortness  of breath dyspnea    with exertion  . Thyroid disease   . Umbilical hernia     Patient Active Problem List   Diagnosis Date Noted  . Anemia, chronic disease 07/27/2017  . Weight loss 11/19/2015  . Chronic constipation 11/19/2015  . Other specified hypothyroidism 11/14/2015  . DJD (degenerative joint disease) of knee 06/19/2013  . Right knee DJD   . Hyperlipidemia   . Thyroid disease   . Chest pain 12/26/2012  . Unstable angina ([place]) 12/03/2012  . Osteoporosis 12/03/2012  . Palpitations 12/03/2012  . Dyslipidemia 12/03/2012  . History of breast cancer in female 07/06/2011  . History of non-Hodgkin's lymphoma 07/06/2011  . Hypothyroid 07/06/2011  . Chronic ITP (idiopathic thrombocytopenia) (HCC) 07/06/2011    Past Surgical History:  Procedure Laterality Date  . ADENOIDECTOMY     as a child  . BREAST LUMPECTOMY Right 1994  . BREAST SURGERY Right   . BUNIONECTOMY Right   . CARDIAC CATHETERIZATION  2014  . COLONOSCOPY    . FOOT SURGERY Right   . INSERTION OF MESH N/A 04/17/2015   Procedure: INSERTION OF MESH;  Surgeon: Ralene Ok, MD;  Location: [facility];  Service: General;  Laterality: N/A;  . KNEE SURGERY    . LEFT HEART CATHETERIZATION WITH CORONARY ANGIOGRAM N/A 12/05/2012   Procedure: LEFT HEART CATHETERIZATION WITH CORONARY ANGIOGRAM;  Surgeon: [name] Sine, MD;  Location: [facility] C[facility] LAB;  Service: Cardiovascular;  Laterality: N/A;  . right  breast surgery     d/t breast cancer  . TONSILLECTOMY     as a child  . TOTAL KNEE ARTHROPLASTY Right 06/19/2013   Procedure: TOTAL KNEE ARTHROPLASTY;  Surgeon: Lorn Junes, MD;  Location: [facility];  Service: Orthopedics;  Laterality: Right;  . TURBINATE REDUCTION    . UMBILICAL HERNIA REPAIR N/A 04/17/2015   Procedure: LAPAROSCOPIC UMBILICAL HERNIA REPAIR WITH MESH;  Surgeon: Ralene Ok, MD;  Location: [facility];  Service: General;  Laterality: N/A;  . UPPER GASTROINTESTINAL ENDOSCOPY       OB History   None      Home  Medications    Prior to Admission medications   Medication Sig Start Date End Date Taking? Authorizing Provider  acetaminophen (TYLENOL) 500 MG tablet Take 1 tablet (500 mg total) by mouth every 6 (six) hours as needed for mild pain. 04/19/16   Quintella Reichert, MD  aspirin 81 MG tablet Take 81 mg by mouth daily.    [provider]  buPROPion (WELLBUTRIN XL) 150 MG 24 hr tablet Take 150 mg by mouth daily. 06/24/17   [provider]  calcium carbonate (OSCAL) 1500 (600 Ca) MG TABS tablet Take 600 mg of elemental calcium by mouth 2 (two) times daily with a meal.    [provider]  cholecalciferol (VITAMIN D) 1000 UNITS tablet Take 1,000 Units by mouth daily.    [provider]  citalopram (CELEXA) 40 MG tablet Take 40 mg by mouth daily.    [provider]  Cyanocobalamin (VITAMIN B-12 PO) Take 1,000 mcg by mouth daily.    [provider]  esomeprazole (NEXIUM) 40 MG capsule Take 40 mg by mouth 2 (two) times daily before a meal.     [provider]  famciclovir (FAMVIR) 125 MG tablet Take 250 mg by mouth 2 (two) times daily as needed. For 5 days as needed for outbreak    [provider]  L-Methylfolate-Algae-B12-B6 [place]) 3-90.314-2-35 MG CAPS Take 1 tablet by mouth 2 (two) times daily. 03/20/15   [provider]  levothyroxine (SYNTHROID, LEVOTHROID) 75 MCG tablet Take 75 mcg by mouth daily.      [provider]  lovastatin (MEVACOR) 20 MG tablet Take 20 mg by mouth at bedtime.      [provider]  LYRICA 50 MG capsule Take 50 mg by mouth 2 (two) times daily.  12/25/14   [provider]  magnesium oxide (MAG-OX) 400 MG tablet Take 400 mg by mouth daily.    [provider]  metoprolol succinate (TOPROL-XL) 25 MG 24 hr tablet Take 25 mg by mouth daily.  11/22/12   [provider]  Multiple Vitamins-Minerals (MULTIVITAMINS THER. W/MINERALS) TABS Take 1 tablet by mouth daily.       [provider]  Polyethyl Glycol-Propyl Glycol (SYSTANE) 0.4-0.3 % SOLN Apply 2 drops to eye 2 (two) times daily as needed (for dry eyes).    [provider]  Vitamin D, Ergocalciferol, (DRISDOL) 50000 UNITS CAPS Take 50,000 Units by mouth every 14 (fourteen) days.      [provider]    Family History Family History  Problem Relation Age of Onset  . Hyperlipidemia Father   .  Heart disease Father   . Heart disease Sister   . Cancer Brother   . Diabetes Son   . Heart disease Maternal Grandfather   . Colon cancer Neg Hx   . Breast cancer Neg Hx     Social History Social History   Tobacco Use  . Smoking status: Former Smoker    Packs/day: 0.30    Types: Cigarettes  . Smokeless tobacco: Never Used  . Tobacco comment: quit smoking 1985  Substance Use Topics  . Alcohol use: No    Alcohol/week: 0.0 oz  . Drug use: No     Allergies   Oxycodone; Codeine; Doxycycline; Meperidine and related; Other; Sulfa antibiotics; and Sulfacetamide sodium   Review of Systems Review of Systems  Constitutional: Negative for appetite change and fever.  HENT: Negative for congestion.   Respiratory: Negative for shortness of breath.   Cardiovascular: Negative for chest pain.  Gastrointestinal: Negative for abdominal pain.  Genitourinary: Negative for frequency.  Musculoskeletal: Positive for neck pain.  Skin: Negative for rash and wound.  Neurological: Negative for weakness and light-headedness.  Hematological: Negative for adenopathy.  Psychiatric/Behavioral: Negative for agitation.     Physical Exam Updated Vital Signs BP 133/78 (BP Location: Left Arm)   Pulse 66   Resp 18   Ht 5\' 5"  (1.651 m)   Wt 78.5 kg (173 lb)   SpO2 97%   BMI 28.79 kg/m   Physical Exam  Constitutional: Darlene [name] appears well-developed.  HENT:  Head: Normocephalic.  Some tenderness to occipital area.  Eyes: Pupils are equal, round, and reactive to light.  Neck: Neck supple.    Cardiovascular: Normal rate.  Pulmonary/Chest: Effort normal.  Abdominal: There is no tenderness.  Musculoskeletal: Darlene [name] exhibits tenderness.  No extremity tenderness.  Tenderness to left posterior buttock.  No midline tenderness.  Good range of motion hips.  Neurological: Darlene [name] is alert.  Skin: Skin is warm. Capillary refill takes less than 2 seconds.     ED Treatments / Results  Labs (all labs ordered are listed, but only abnormal results are displayed) Labs Reviewed - No data to display  EKG None  Radiology Dg Pelvis 1-2 Views  Result Date: 10/29/2017 CLINICAL DATA:  Fall with pelvic pain EXAM: PELVIS - 1-2 VIEW COMPARISON:  07/13/2017 abdominal CT FINDINGS: Osteitis pubis and chronic irregularity of the sacroiliac joints. No visible fracture or diastasis. Limited at the level of the sacrum due to soft tissue attenuation and bowel gas. IMPRESSION: No visible fracture. Electronically Signed   By: Monte Fantasia M.D.   On: 10/29/2017 16:54   Ct Head Wo Contrast  Result Date: 10/29/2017 CLINICAL DATA:  Status post fall with trauma to the posterior head. EXAM: CT HEAD WITHOUT CONTRAST TECHNIQUE: Contiguous axial images were obtained from the base of the skull through the vertex without intravenous contrast. COMPARISON:  None. FINDINGS: Brain: No evidence of acute infarction, hemorrhage, hydrocephalus, extra-axial collection or mass lesion/mass effect. Mild brain parenchymal volume loss and periventricular microangiopathy. Vascular: Calcific atherosclerotic disease of intra cavernous carotid arteries. Skull: Normal. Negative for fracture or focal lesion. Sinuses/Orbits: No acute finding. Other: Right parieto-occipital scalp hematoma. IMPRESSION: No acute intracranial abnormality. Mild brain parenchymal volume loss and chronic microvascular disease. Right parieto-occipital scalp hematoma without evidence of skull fractures. Electronically Signed   By: Fidela Salisbury M.D.   On: 10/29/2017  17:45    Procedures Procedures (including critical care time)  Medications Ordered in ED Medications - No data to display   Initial Impression /  Assessment and Plan / ED Course  I have reviewed the triage vital signs and the nursing notes.  Pertinent labs & imaging results that were available during my care of the patient were reviewed by me and considered in my medical decision making (see chart for details).     Patient with fall.  Negative head CT.  Negative pelvic x-ray.  No other apparent injury.  Will discharge home.  Final Clinical Impressions(s) / ED Diagnoses   Final diagnoses:  Fall, initial encounter  Mild closed head injury, initial encounter  Contusion of buttock, initial encounter    ED Discharge Orders    None       Davonna Belling, MD 10/29/17 [phone]

## 2017-10-29 NOTE — ED Notes (Signed)
Bed: GY17 Expected date:  Expected time:  Means of arrival:  Comments: 83 yo f fall, hit head, no blood thinner

## 2017-11-01 DIAGNOSIS — M47892 Other spondylosis, cervical region: Secondary | ICD-10-CM | POA: Diagnosis not present

## 2017-11-01 DIAGNOSIS — M542 Cervicalgia: Secondary | ICD-10-CM | POA: Diagnosis not present

## 2017-11-01 DIAGNOSIS — M2560 Stiffness of unspecified joint, not elsewhere classified: Secondary | ICD-10-CM | POA: Diagnosis not present

## 2017-11-03 DIAGNOSIS — M47892 Other spondylosis, cervical region: Secondary | ICD-10-CM | POA: Diagnosis not present

## 2017-11-03 DIAGNOSIS — M542 Cervicalgia: Secondary | ICD-10-CM | POA: Diagnosis not present

## 2017-11-03 DIAGNOSIS — M2560 Stiffness of unspecified joint, not elsewhere classified: Secondary | ICD-10-CM | POA: Diagnosis not present

## 2017-11-05 DIAGNOSIS — R269 Unspecified abnormalities of gait and mobility: Secondary | ICD-10-CM | POA: Diagnosis not present

## 2017-11-05 DIAGNOSIS — W19XXXD Unspecified fall, subsequent encounter: Secondary | ICD-10-CM | POA: Diagnosis not present

## 2017-11-05 DIAGNOSIS — M791 Myalgia, unspecified site: Secondary | ICD-10-CM | POA: Diagnosis not present

## 2017-11-05 DIAGNOSIS — M542 Cervicalgia: Secondary | ICD-10-CM | POA: Diagnosis not present

## 2017-11-08 DIAGNOSIS — M2560 Stiffness of unspecified joint, not elsewhere classified: Secondary | ICD-10-CM | POA: Diagnosis not present

## 2017-11-08 DIAGNOSIS — M47892 Other spondylosis, cervical region: Secondary | ICD-10-CM | POA: Diagnosis not present

## 2017-11-08 DIAGNOSIS — M542 Cervicalgia: Secondary | ICD-10-CM | POA: Diagnosis not present

## 2017-11-09 ENCOUNTER — Telehealth: Payer: Self-pay | Admitting: Hematology and Oncology

## 2017-11-09 ENCOUNTER — Inpatient Hospital Stay: Payer: Medicare Other | Attending: Hematology and Oncology

## 2017-11-09 ENCOUNTER — Inpatient Hospital Stay (HOSPITAL_BASED_OUTPATIENT_CLINIC_OR_DEPARTMENT_OTHER): Payer: Medicare Other | Admitting: Hematology and Oncology

## 2017-11-09 ENCOUNTER — Encounter: Payer: Self-pay | Admitting: Hematology and Oncology

## 2017-11-09 DIAGNOSIS — Z7982 Long term (current) use of aspirin: Secondary | ICD-10-CM

## 2017-11-09 DIAGNOSIS — Z79899 Other long term (current) drug therapy: Secondary | ICD-10-CM | POA: Diagnosis not present

## 2017-11-09 DIAGNOSIS — D693 Immune thrombocytopenic purpura: Secondary | ICD-10-CM

## 2017-11-09 DIAGNOSIS — Z8572 Personal history of non-Hodgkin lymphomas: Secondary | ICD-10-CM

## 2017-11-09 DIAGNOSIS — Z853 Personal history of malignant neoplasm of breast: Secondary | ICD-10-CM

## 2017-11-09 LAB — CBC WITH DIFFERENTIAL/PLATELET
Basophils Absolute: 0.1 10*3/uL (ref 0.0–0.1)
Basophils Relative: 1 %
EOS PCT: 2 %
Eosinophils Absolute: 0.2 10*3/uL (ref 0.0–0.5)
HEMATOCRIT: 34 % — AB (ref 34.8–46.6)
HEMOGLOBIN: 11 g/dL — AB (ref 11.6–15.9)
LYMPHS PCT: 23 %
Lymphs Abs: 1.6 10*3/uL (ref 0.9–3.3)
MCH: 27.7 pg (ref 25.1–34.0)
MCHC: 32.4 g/dL (ref 31.5–36.0)
MCV: 85.6 fL (ref 79.5–101.0)
MONOS PCT: 7 %
Monocytes Absolute: 0.5 10*3/uL (ref 0.1–0.9)
NEUTROS PCT: 67 %
Neutro Abs: 4.7 10*3/uL (ref 1.5–6.5)
Platelets: 76 10*3/uL — ABNORMAL LOW (ref 145–400)
RBC: 3.97 MIL/uL (ref 3.70–5.45)
RDW: 14.5 % (ref 11.2–14.5)
WBC: 7 10*3/uL (ref 3.9–10.3)

## 2017-11-09 NOTE — Assessment & Plan Note (Signed)
CT scan showed no evidence of cancer recurrence.  She will continue screening mammogram, due in June 2020 Her last mammogram show no evidence of disease

## 2017-11-09 NOTE — Assessment & Plan Note (Signed)
She has palpable lymphadenopathy on the left supraclavicular region but it does not bother her I suspect her ITP is definitely related For now, due to lack of symptoms, I do not feel strongly we need to pursue imaging study or biopsy I plan to see her back in 3 months for further follow-up and if her lymph node started to cause trouble, we will consider repeat biopsy and further evaluation Recent CT imaging in March 2019 showed minimum lymphadenopathy

## 2017-11-09 NOTE — Assessment & Plan Note (Signed)
Her platelet count is stable  She is not symptomatic I plan to recheck again in 3 months The patient is educated to watch out for signs and symptoms of bleeding or excessive bruising She will continue aspirin therapy as long as her platelet count is above 50,000

## 2017-11-09 NOTE — Telephone Encounter (Signed)
Gave avs and calendar ° °

## 2017-11-09 NOTE — Progress Notes (Signed)
[facility] OFFICE PROGRESS NOTE  Patient Care Team: Thressa Sheller, MD as PCP - General (Internal Medicine)  ASSESSMENT & PLAN:  History of non-Hodgkin's lymphoma She has palpable lymphadenopathy on the left supraclavicular region but it does not bother her I suspect her ITP is definitely related For now, due to lack of symptoms, I do not feel strongly we need to pursue imaging study or biopsy I plan to see her back in 3 months for further follow-up and if her lymph node started to cause trouble, we will consider repeat biopsy and further evaluation Recent CT imaging in March 2019 showed minimum lymphadenopathy  Chronic ITP (idiopathic thrombocytopenia) (HCC) Her platelet count is stable  She is not symptomatic I plan to recheck again in 3 months The patient is educated to watch out for signs and symptoms of bleeding or excessive bruising She will continue aspirin therapy as long as her platelet count is above 50,000  History of breast cancer in female CT scan showed no evidence of cancer recurrence.  She will continue screening mammogram, due in June 2020 Her last mammogram show no evidence of disease   No orders of the defined types were placed in this encounter.   INTERVAL HISTORY: Please see below for problem oriented charting. She returns for further follow-up Since last time I saw her, she had a fall She went to the emergency department and had CT imaging which show hematoma.  Since then, she felt better She has small palpable lymph node in the left supraclavicular region but it does not bother her No other lymphadenopathy Denies recent infection The patient denies any recent signs or symptoms of bleeding such as spontaneous epistaxis, hematuria or hematochezia.  SUMMARY OF ONCOLOGIC HISTORY:  LEI DOWER was transferred to my care after her prior physician has left.  I reviewed the patient's records extensive and collaborated the history with  the patient. 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 She never required any systemic treatment. 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 This has also resolved on its own. She has never developed any additional adenopathy other than the single lymph node palpable in the left supraclavicular fossa.  She did develop a second primary stage I,  0.8 cm,  ER negative cancer of the right breast, treated with lumpectomy, radiation and six cycles of CMF chemotherapy in 1994. No evidence of a new disease from the breast cancer either. Most recent mammogram done in June 2015 showed stable fibroglandular changes in her breasts. She was called back for an ultrasound of the left breast which also came back benign. She had a CT scan of the abdomen done on 11/14/15 because of significant intentional weight loss and abdominal discomfort.  Imaging studies show no evidence of lymphoma.  Moderate stool burden was noted. Repeat CT scan in March 2019 show stable lymphadenopathy with no signs or symptoms of cancer recurrence  REVIEW OF SYSTEMS:   Constitutional: Denies fevers, chills or abnormal weight loss Eyes: Denies blurriness of vision Ears, nose, mouth, throat, and face: Denies mucositis or sore throat Respiratory: Denies cough, dyspnea or wheezes Cardiovascular: Denies palpitation, chest discomfort or lower extremity swelling Gastrointestinal:  Denies nausea, heartburn or change in bowel habits Skin: Denies abnormal skin rashes Neurological:Denies numbness, tingling or new weaknesses Behavioral/Psych: Mood is stable,  no new changes  All other systems were reviewed with the patient and are negative.  I have reviewed the past medical history, past surgical history, social history and family  history with the patient and they are unchanged from previous note.  ALLERGIES:  is allergic to oxycodone; codeine; doxycycline; meperidine and related; other; sulfa antibiotics; and sulfacetamide sodium.  MEDICATIONS:  Current Outpatient Medications  Medication Sig Dispense Refill  . acetaminophen (TYLENOL) 500 MG tablet Take 1 tablet (500 mg total) by mouth every 6 (six) hours as needed for mild pain. 30 tablet 0  . aspirin 81 MG tablet Take 81 mg by mouth daily.    Marland Kitchen buPROPion (WELLBUTRIN XL) 150 MG 24 hr tablet Take 150 mg by mouth daily.    . calcium carbonate (OSCAL) 1500 (600 Ca) MG TABS tablet Take 600 mg of elemental calcium by mouth 2 (two) times daily with a meal.    . cholecalciferol (VITAMIN D) 1000 UNITS tablet Take 1,000 Units by mouth daily.    . citalopram (CELEXA) 40 MG tablet Take 40 mg by mouth daily.    . Cyanocobalamin (VITAMIN B-12 PO) Take 1,000 mcg by mouth daily.    Marland Kitchen esomeprazole (NEXIUM) 40 MG capsule Take 40 mg by mouth 2 (two) times daily before a meal.     . famciclovir (FAMVIR) 125 MG tablet Take 250 mg by mouth 2 (two) times daily as needed. For 5 days as needed for outbreak    . L-Methylfolate-Algae-B12-B6 (METANX) 3-90.314-2-35 MG CAPS Take 1 tablet by mouth 2 (two) times daily.    Marland Kitchen levothyroxine (SYNTHROID, LEVOTHROID) 75 MCG tablet Take 75 mcg by mouth daily.      Marland Kitchen lovastatin (MEVACOR) 20 MG tablet Take 20 mg by mouth at bedtime.      Marland Kitchen LYRICA 50 MG capsule Take 50 mg by mouth 2 (two) times daily.     . magnesium oxide (MAG-OX) 400 MG tablet Take 400 mg by mouth daily.    . metoprolol succinate (TOPROL-XL) 25 MG 24 hr tablet Take 25 mg by mouth daily.     . Multiple Vitamins-Minerals (MULTIVITAMINS THER. W/MINERALS) TABS Take 1 tablet by mouth daily.      Vladimir Faster Glycol-Propyl Glycol (SYSTANE) 0.4-0.3 % SOLN Apply 2 drops to eye 2 (two) times daily as needed (for dry eyes).    . Vitamin D, Ergocalciferol, (DRISDOL) 50000 UNITS CAPS Take 50,000 Units  by mouth every 14 (fourteen) days.       No current facility-administered medications for this visit.     PHYSICAL EXAMINATION: ECOG PERFORMANCE STATUS: 1 - Symptomatic but completely ambulatory  Vitals:   11/09/17 1157  BP: 128/71  Pulse: 71  Resp: 18  Temp: 97.8 F (36.6 C)  SpO2: 98%   Filed Weights   11/09/17 1157  Weight: 177 lb 11.2 oz (80.6 kg)    GENERAL:alert, no distress and comfortable SKIN: skin color, texture, turgor are normal, no rashes or significant lesions EYES: normal, Conjunctiva are pink and non-injected, sclera clear OROPHARYNX:no exudate, no erythema and lips, buccal mucosa, and tongue normal  NECK: supple, thyroid normal size, non-tender, without nodularity LYMPH: She has palpable lymphadenopathy in the supraclavicular region, but none elsewhere LUNGS: clear to auscultation and percussion with normal breathing effort HEART: regular rate & rhythm and no murmurs and no lower extremity edema ABDOMEN:abdomen soft, non-tender and normal bowel sounds Musculoskeletal:no cyanosis of digits and no clubbing  NEURO: alert & oriented x 3 with fluent speech, no focal motor/sensory  deficits  LABORATORY DATA:  I have reviewed the data as listed    Component Value Date/Time   NA 137 07/08/2017 1053   NA 135 (L) 07/07/2016 1035   K 4.7 07/08/2017 1053   K 4.8 07/07/2016 1035   CL 104 07/08/2017 1053   CL 106 07/11/2012 0906   CO2 26 07/08/2017 1053   CO2 24 07/07/2016 1035   GLUCOSE 98 07/08/2017 1053   GLUCOSE 85 07/07/2016 1035   GLUCOSE 107 (H) 07/11/2012 0906   BUN 15 07/08/2017 1053   BUN 19.9 07/07/2016 1035   CREATININE 0.79 07/08/2017 1053   CREATININE 0.7 07/07/2016 1035   CALCIUM 10.4 07/08/2017 1053   CALCIUM 10.2 07/07/2016 1035   PROT 6.9 07/08/2017 1053   PROT 6.6 07/07/2016 1035   ALBUMIN 3.9 07/08/2017 1053   ALBUMIN 3.9 07/07/2016 1035   AST 14 07/08/2017 1053   AST 14 07/07/2016 1035   ALT 19 07/08/2017 1053   ALT 17 07/07/2016  1035   ALKPHOS 60 07/08/2017 1053   ALKPHOS 58 07/07/2016 1035   BILITOT 0.7 07/08/2017 1053   BILITOT 0.61 07/07/2016 1035   GFRNONAA >60 07/08/2017 1053   GFRAA >60 07/08/2017 1053    No results found for: SPEP, UPEP  Lab Results  Component Value Date   WBC 7.0 11/09/2017   NEUTROABS 4.7 11/09/2017   HGB 11.0 (L) 11/09/2017   HCT 34.0 (L) 11/09/2017   MCV 85.6 11/09/2017   PLT 76 (L) 11/09/2017      Chemistry      Component Value Date/Time   NA 137 07/08/2017 1053   NA 135 (L) 07/07/2016 1035   K 4.7 07/08/2017 1053   K 4.8 07/07/2016 1035   CL 104 07/08/2017 1053   CL 106 07/11/2012 0906   CO2 26 07/08/2017 1053   CO2 24 07/07/2016 1035   BUN 15 07/08/2017 1053   BUN 19.9 07/07/2016 1035   CREATININE 0.79 07/08/2017 1053   CREATININE 0.7 07/07/2016 1035      Component Value Date/Time   CALCIUM 10.4 07/08/2017 1053   CALCIUM 10.2 07/07/2016 1035   ALKPHOS 60 07/08/2017 1053   ALKPHOS 58 07/07/2016 1035   AST 14 07/08/2017 1053   AST 14 07/07/2016 1035   ALT 19 07/08/2017 1053   ALT 17 07/07/2016 1035   BILITOT 0.7 07/08/2017 1053   BILITOT 0.61 07/07/2016 1035       RADIOGRAPHIC STUDIES: I have personally reviewed the radiological images as listed and agreed with the findings in the report. Dg Pelvis 1-2 Views  Result Date: 10/29/2017 CLINICAL DATA:  Fall with pelvic pain EXAM: PELVIS - 1-2 VIEW COMPARISON:  07/13/2017 abdominal CT FINDINGS: Osteitis pubis and chronic irregularity of the sacroiliac joints. No visible fracture or diastasis. Limited at the level of the sacrum due to soft tissue attenuation and bowel gas. IMPRESSION: No visible fracture. Electronically Signed   By: Monte Fantasia M.D.   On: 10/29/2017 16:54   Ct Head Wo Contrast  Result Date: 10/29/2017 CLINICAL DATA:  Status post fall with trauma to the posterior head. EXAM: CT HEAD WITHOUT CONTRAST TECHNIQUE: Contiguous axial images were obtained from the base of the skull through the  vertex without intravenous contrast. COMPARISON:  None. FINDINGS: Brain: No evidence of acute infarction, hemorrhage, hydrocephalus, extra-axial collection or mass lesion/mass effect. Mild brain parenchymal volume loss and periventricular microangiopathy. Vascular: Calcific atherosclerotic disease of intra cavernous carotid arteries. Skull: Normal. Negative for fracture or focal lesion. Sinuses/Orbits: No  acute finding. Other: Right parieto-occipital scalp hematoma. IMPRESSION: No acute intracranial abnormality. Mild brain parenchymal volume loss and chronic microvascular disease. Right parieto-occipital scalp hematoma without evidence of skull fractures. Electronically Signed   By: Fidela Salisbury M.D.   On: 10/29/2017 17:45   Mm 3d Screen Breast Bilateral  Result Date: 10/13/2017 CLINICAL DATA:  Screening. EXAM: DIGITAL SCREENING BILATERAL MAMMOGRAM WITH TOMO AND CAD COMPARISON:  Previous exam(s). ACR Breast Density Category b: There are scattered areas of fibroglandular density. FINDINGS: There are no findings suspicious for malignancy. Images were processed with CAD. IMPRESSION: No mammographic evidence of malignancy. A result letter of this screening mammogram will be mailed directly to the patient. RECOMMENDATION: Screening mammogram in one year. (Code:SM-B-01Y) BI-RADS CATEGORY  1: Negative. Electronically Signed   By: Ammie Ferrier M.D.   On: 10/13/2017 14:24    All questions were answered. The patient knows to call the clinic with any problems, questions or concerns. No barriers to learning was detected.  I spent 15 minutes counseling the patient face to face. The total time spent in the appointment was 20 minutes and more than 50% was on counseling and review of test results  Heath Lark, MD 11/09/2017 3:05 PM

## 2017-11-10 DIAGNOSIS — M2560 Stiffness of unspecified joint, not elsewhere classified: Secondary | ICD-10-CM | POA: Diagnosis not present

## 2017-11-10 DIAGNOSIS — M542 Cervicalgia: Secondary | ICD-10-CM | POA: Diagnosis not present

## 2017-11-10 DIAGNOSIS — M47892 Other spondylosis, cervical region: Secondary | ICD-10-CM | POA: Diagnosis not present

## 2017-11-15 DIAGNOSIS — M542 Cervicalgia: Secondary | ICD-10-CM | POA: Diagnosis not present

## 2017-11-15 DIAGNOSIS — M2560 Stiffness of unspecified joint, not elsewhere classified: Secondary | ICD-10-CM | POA: Diagnosis not present

## 2017-11-15 DIAGNOSIS — M47892 Other spondylosis, cervical region: Secondary | ICD-10-CM | POA: Diagnosis not present

## 2017-11-19 ENCOUNTER — Encounter: Payer: Self-pay | Admitting: Neurology

## 2017-11-19 ENCOUNTER — Ambulatory Visit (INDEPENDENT_AMBULATORY_CARE_PROVIDER_SITE_OTHER): Payer: Medicare Other | Admitting: Neurology

## 2017-11-19 VITALS — BP 100/70 | HR 68 | Ht 65.0 in | Wt 179.2 lb

## 2017-11-19 DIAGNOSIS — M4716 Other spondylosis with myelopathy, lumbar region: Secondary | ICD-10-CM

## 2017-11-19 DIAGNOSIS — I951 Orthostatic hypotension: Secondary | ICD-10-CM | POA: Diagnosis not present

## 2017-11-19 NOTE — Progress Notes (Signed)
Follow-up Visit   Date: 11/19/17    Darlene [name] MRN: [id number] DOB: 09-28-1934   Interim History: Darlene [name] is a 83 y.o. left-handed Caucasian female with depression, GERD, hypothyroidism, and history of breast cancer s/p chemotherapy and radiation (1994), chronic ITP, and lymphoma (1999) returning to the clinic for follow-up of orthostasis and gait imbalance.  The patient was accompanied to the clinic by self.  History of present illness: Starting ~ 2017, she gradually began having problems with balance.  She feels unsteady when walking and began using a cane 2 years ago.  She has fallen four times over the past year, usually because of tripping over objects. Her legs do not give out with these fells, she tends to always loose her balance.  She fractured her left patella with one of her falls.  She was diagnosed with neuropathy in 2015 and continues to have numbness and tingling of the feet. She takes Lyrica 50mg  twice daily which helps her pain.  She does not have any weakness of the legs.  She endorses mild lightheadedness.  No chest pain or shortness of breath.   UPDATE 11/19/2017:  She is here for 4 month follow-up visit.  She continues to have imbalance and had falls since she was last here, with one ER visit on 6/28 while talking her dog for a walk.  CT head shows no intracranial changes, there was a right scalp hematoma.  She has completed PT and does not appreciate any benefit with her balance.  She did get a rollator which she has started to use for long distances.  She denies any weakness, numbness/tingling of the legs.  Since her last falls, she has low back pain and is not able to walk as far as before.  No bowel/bladder problems.  Lightheadedness has markedly improved, she is staying well hydrated and also compliant with using compression stockings.    Medications:  Current Outpatient Medications on File Prior to Visit  Medication Sig Dispense Refill  .  acetaminophen (TYLENOL) 500 MG tablet Take 1 tablet (500 mg total) by mouth every 6 (six) hours as needed for mild pain. 30 tablet 0  . aspirin 81 MG tablet Take 81 mg by mouth daily.    Marland Kitchen buPROPion (WELLBUTRIN XL) 150 MG 24 hr tablet Take 150 mg by mouth daily.    . calcium carbonate (OSCAL) 1500 (600 Ca) MG TABS tablet Take 600 mg of elemental calcium by mouth 2 (two) times daily with a meal.    . cholecalciferol (VITAMIN D) 1000 UNITS tablet Take 1,000 Units by mouth daily.    . citalopram (CELEXA) 40 MG tablet Take 40 mg by mouth daily.    . Cyanocobalamin (VITAMIN B-12 PO) Take 1,000 mcg by mouth daily.    Marland Kitchen esomeprazole (NEXIUM) 40 MG capsule Take 40 mg by mouth 2 (two) times daily before a meal.     . famciclovir (FAMVIR) 125 MG tablet Take 250 mg by mouth 2 (two) times daily as needed. For 5 days as needed for outbreak    . L-Methylfolate-Algae-B12-B6 (METANX) 3-90.314-2-35 MG CAPS Take 1 tablet by mouth 2 (two) times daily.    Marland Kitchen levothyroxine (SYNTHROID, LEVOTHROID) 75 MCG tablet Take 75 mcg by mouth daily.      Marland Kitchen lovastatin (MEVACOR) 20 MG tablet Take 20 mg by mouth at bedtime.      Marland Kitchen LYRICA 50 MG capsule Take 50 mg by mouth 2 (two) times daily.     Marland Kitchen  magnesium oxide (MAG-OX) 400 MG tablet Take 400 mg by mouth daily.    . metoprolol succinate (TOPROL-XL) 25 MG 24 hr tablet Take 25 mg by mouth daily.     . Multiple Vitamins-Minerals (MULTIVITAMINS THER. W/MINERALS) TABS Take 1 tablet by mouth daily.      Vladimir Faster Glycol-Propyl Glycol (SYSTANE) 0.4-0.3 % SOLN Apply 2 drops to eye 2 (two) times daily as needed (for dry eyes).    . Vitamin D, Ergocalciferol, (DRISDOL) 50000 UNITS CAPS Take 50,000 Units by mouth every 14 (fourteen) days.       No current facility-administered medications on file prior to visit.     Allergies:  Allergies  Allergen Reactions  . Oxycodone Other (See Comments)    Made her depressed  . Codeine Itching           . Doxycycline Swelling and Other (See  Comments)    Burns throat also  Burns throat also Burns throat also  . Meperidine And Related Itching and Rash    Makes cheeks red also  . Other Itching and Rash    Makes cheeks red also  . Sulfa Antibiotics Itching  . Sulfacetamide Sodium Itching    Review of Systems:  CONSTITUTIONAL: No fevers, chills, night sweats, or weight loss.  EYES: No visual changes or eye pain ENT: No hearing changes.  No history of nose bleeds.   RESPIRATORY: No cough, wheezing and shortness of breath.   CARDIOVASCULAR: Negative for chest pain, and palpitations.   GI: Negative for abdominal discomfort, blood in stools or black stools.  No recent change in bowel habits.   GU:  No history of incontinence.   MUSCLOSKELETAL: +history of joint pain or swelling.  No myalgias.   SKIN: Negative for lesions, rash, and itching.   ENDOCRINE: Negative for cold or heat intolerance, polydipsia or goiter.   PSYCH:  No depression or anxiety symptoms.   NEURO: As Above.   Vital Signs:  BP 100/70   Pulse 68   Ht 5\' 5"  (1.651 m)   Wt 179 lb 4 oz (81.3 kg)   SpO2 96%   BMI 29.83 kg/m   Orthostatic VS for the past 24 hrs (Last 3 readings):  BP- Lying Pulse- Lying BP- Sitting Pulse- Sitting BP- Standing at 0 minutes Pulse- Standing at 0 minutes  11/19/17 1550 130/80 75 120/80 75 118/80 76   General Medical Exam:   General:  Well appearing, comfortable  Eyes/ENT: see cranial nerve examination.   Neck:  No carotid bruits. Respiratory:  Clear to auscultation, good air entry bilaterally.   Cardiac:  Regular rate and rhythm, no murmur.   Ext:  Right foot with inversion deformity  Neurological Exam: MENTAL STATUS including orientation to time, place, person, recent and remote memory, attention span and concentration, language, and fund of knowledge is normal.  Speech is not dysarthric.  CRANIAL NERVES: Pupils equal round and reactive to light.  Normal conjugate, extra-ocular eye movements in all directions of gaze.   No ptosis.  Face is symmetric.   MOTOR:  Motor strength is 5/5 in all extremities.  No atrophy, fasciculations or abnormal movements.  No pronator drift.  Tone is mildly increased in the legs.    MSRs:  Right  Left brachioradialis 2+  brachioradialis 2+  biceps 2+  biceps 2+  triceps 2+  triceps 2+  patellar 3+  patellar 3+  ankle jerk 2+  ankle jerk 2+   SENSORY:  Reduced vibration distal to ankles bilaterally.  Rhomberg sign is present  COORDINATION/GAIT:  Normal finger-to- nose-finger.  Intact rapid alternating movements bilaterally.  Gait is ataxic and spastic appearing, assisted with cane  Data: MRI cervical spine wo contrast 01/13/2015: This is an abnormal MRI of the cervical spine showing multilevel degenerative changes as detailed above. The most significant findings are: 1. At C4-C5 there is loss of disc height associated with edema within the endplates. There is moderate bilateral foraminal narrowing. Although there is no definite nerve root compression there is some encroachment upon both of the exiting C5 nerve roots. 2. At C5-C6 there or degenerative changes causing moderate right foraminal narrowing. Although there is no nerve root compression there is some encroachment upon the exiting C6 nerve root. 3. There are milder degenerative changes at C3-C3, C3-C4 and C6-C7 that did not lead to any nerve root impingement. 4. The spinal cord appears normal.  MRI lumbar spine 08/01/2017:  1. Advanced lumbar disc and facet degeneration with moderate multifactorial spinal stenosis at L3-4 and mild spinal stenosis at L2-3. 2. Severe left neural foraminal stenosis at L5-S1. 3. Small right-sided disc protrusion at L2-3 potentially affecting the extraforaminal L2 nerve.  Labs 07/19/2017:   Vitamin B1 37, copper 128, folate >23, TSH 2.48  IMPRESSION/PLAN: 1.  Lumbar spinal stenosis at L3-4 with myelopathy, spastic gait;  also with severe left foraminal stenosis at L5-S1.  She denies left leg radicular symptoms.   - MRI lumbar spine reviewed with patient and agree with findings  - No improvement with physical therapy  - I will refer her to neurosurgery for evaluation  - Encouraged to be complaint with using a 4 wheeled rollator  2.  Orthostasis, improved and with normal orthostatic vital signs  - She is compliant with using compression stockings and keeping well hydrated  - No indicated for pharmacological therapy at this time  3.  Idiopathic peripheral neuropathy, stable. She has history of breast cancer and was on chemotherapy, but this was > 20 years ago and symptoms of neuropathy started over the past 2-3 years, so unlikely to be related. Neuropathy labs are normal.   The duration of this appointment visit was 30 minutes of face-to-face time with the patient.  Greater than 50% of this time was spent in counseling, explanation of diagnosis, planning of further management, and coordination of care.   Thank you for allowing me to participate in patient's care.  If I can answer any additional questions, I would be pleased to do so.    Sincerely,    Allysen Lazo K. Posey Pronto, DO

## 2017-11-19 NOTE — Patient Instructions (Signed)
We will make a referral to neurosurgery

## 2017-11-22 DIAGNOSIS — E559 Vitamin D deficiency, unspecified: Secondary | ICD-10-CM | POA: Diagnosis not present

## 2017-11-22 DIAGNOSIS — I10 Essential (primary) hypertension: Secondary | ICD-10-CM | POA: Diagnosis not present

## 2017-11-22 DIAGNOSIS — E785 Hyperlipidemia, unspecified: Secondary | ICD-10-CM | POA: Diagnosis not present

## 2017-11-22 DIAGNOSIS — E039 Hypothyroidism, unspecified: Secondary | ICD-10-CM | POA: Diagnosis not present

## 2017-12-07 DIAGNOSIS — I10 Essential (primary) hypertension: Secondary | ICD-10-CM | POA: Diagnosis not present

## 2017-12-07 DIAGNOSIS — F329 Major depressive disorder, single episode, unspecified: Secondary | ICD-10-CM | POA: Diagnosis not present

## 2017-12-07 DIAGNOSIS — E7800 Pure hypercholesterolemia, unspecified: Secondary | ICD-10-CM | POA: Diagnosis not present

## 2017-12-07 DIAGNOSIS — K219 Gastro-esophageal reflux disease without esophagitis: Secondary | ICD-10-CM | POA: Diagnosis not present

## 2017-12-07 DIAGNOSIS — Z0000 Encounter for general adult medical examination without abnormal findings: Secondary | ICD-10-CM | POA: Diagnosis not present

## 2017-12-07 DIAGNOSIS — L570 Actinic keratosis: Secondary | ICD-10-CM | POA: Diagnosis not present

## 2017-12-07 DIAGNOSIS — R3129 Other microscopic hematuria: Secondary | ICD-10-CM | POA: Diagnosis not present

## 2017-12-07 DIAGNOSIS — E039 Hypothyroidism, unspecified: Secondary | ICD-10-CM | POA: Diagnosis not present

## 2017-12-07 DIAGNOSIS — Z862 Personal history of diseases of the blood and blood-forming organs and certain disorders involving the immune mechanism: Secondary | ICD-10-CM | POA: Diagnosis not present

## 2017-12-07 DIAGNOSIS — C8591 Non-Hodgkin lymphoma, unspecified, lymph nodes of head, face, and neck: Secondary | ICD-10-CM | POA: Diagnosis not present

## 2017-12-14 ENCOUNTER — Telehealth: Payer: Self-pay | Admitting: Neurology

## 2017-12-14 NOTE — Telephone Encounter (Signed)
Patient states she thoughts Dr.Patel was referring her to a surgeon but she has not received a call from anyone and she wanted to check the status of referral.

## 2017-12-15 NOTE — Telephone Encounter (Signed)
Called patient and left her a message that I will send the referral today.

## 2017-12-20 DIAGNOSIS — B372 Candidiasis of skin and nail: Secondary | ICD-10-CM | POA: Diagnosis not present

## 2017-12-20 DIAGNOSIS — R3129 Other microscopic hematuria: Secondary | ICD-10-CM | POA: Diagnosis not present

## 2017-12-20 DIAGNOSIS — Z124 Encounter for screening for malignant neoplasm of cervix: Secondary | ICD-10-CM | POA: Diagnosis not present

## 2017-12-20 DIAGNOSIS — Z01419 Encounter for gynecological examination (general) (routine) without abnormal findings: Secondary | ICD-10-CM | POA: Diagnosis not present

## 2017-12-27 DIAGNOSIS — R3121 Asymptomatic microscopic hematuria: Secondary | ICD-10-CM | POA: Diagnosis not present

## 2017-12-28 DIAGNOSIS — L219 Seborrheic dermatitis, unspecified: Secondary | ICD-10-CM | POA: Diagnosis not present

## 2017-12-29 DIAGNOSIS — M48062 Spinal stenosis, lumbar region with neurogenic claudication: Secondary | ICD-10-CM | POA: Diagnosis not present

## 2018-01-05 ENCOUNTER — Other Ambulatory Visit: Payer: Self-pay | Admitting: Urology

## 2018-01-10 DIAGNOSIS — H2513 Age-related nuclear cataract, bilateral: Secondary | ICD-10-CM | POA: Diagnosis not present

## 2018-01-12 ENCOUNTER — Encounter (HOSPITAL_COMMUNITY): Payer: Self-pay

## 2018-01-12 NOTE — Progress Notes (Signed)
CHEST CT 07-13-17 EPIC

## 2018-01-12 NOTE — Patient Instructions (Addendum)
Darlene [name]  01/12/2018   Your procedure is scheduled on: 01-26-18  Report to [facility] Main  Entrance            Report to admitting at 1030 AM    Call this number if you have problems the morning of surgery [phone]   Remember: Do not eat food or drink liquids :After Midnight.     Take these medicines the morning of surgery with A SIP OF WATER: metoprolol, lyrica, levothyroxine, nexium, celexa, wellbutrin, tylenol if needed                               You may not have any metal on your body including hair pins and              piercings  Do not wear jewelry, make-up, lotions, powders or perfumes, deodorant             Do not wear nail polish.  Do not shave  48 hours prior to surgery.              Do not bring valuables to the hospital. [place].  Contacts, dentures or bridgework may not be worn into surgery.      Patients discharged the day of surgery will not be allowed to drive home.  Name and phone number of your driver:  Special Instructions: N/A              Please read over the following fact sheets you were given: _____________________________________________________________________             [facility] - Preparing for Surgery Before surgery, you can play an important role.  Because skin is not sterile, your skin needs to be as free of germs as possible.  You can reduce the number of germs on your skin by washing with CHG (chlorahexidine gluconate) soap before surgery.  CHG is an antiseptic cleaner which kills germs and bonds with the skin to continue killing germs even after washing. Please DO NOT use if you have an allergy to CHG or antibacterial soaps.  If your skin becomes reddened/irritated stop using the CHG and inform your nurse when you arrive at Short Stay. Do not shave (including legs and underarms) for at least 48 hours prior to the first CHG shower.  You may shave your  face/neck. Please follow these instructions carefully:  1.  Shower with CHG Soap the night before surgery and the  morning of Surgery.  2.  If you choose to wash your hair, wash your hair first as usual with your  normal  shampoo.  3.  After you shampoo, rinse your hair and body thoroughly to remove the  shampoo.                           4.  Use CHG as you would any other liquid soap.  You can apply chg directly  to the skin and wash                       Gently with a scrungie or clean washcloth.  5.  Apply the CHG Soap to your body ONLY FROM  THE NECK DOWN.   Do not use on face/ open                           Wound or open sores. Avoid contact with eyes, ears mouth and genitals (private parts).                       Wash face,  Genitals (private parts) with your normal soap.             6.  Wash thoroughly, paying special attention to the area where your surgery  will be performed.  7.  Thoroughly rinse your body with warm water from the neck down.  8.  DO NOT shower/wash with your normal soap after using and rinsing off  the CHG Soap.                9.  Pat yourself dry with a clean towel.            10.  Wear clean pajamas.            11.  Place clean sheets on your bed the night of your first shower and do not  sleep with pets. Day of Surgery : Do not apply any lotions/deodorants the morning of surgery.  Please wear clean clothes to the hospital/surgery center.  FAILURE TO FOLLOW THESE INSTRUCTIONS MAY RESULT IN THE CANCELLATION OF YOUR SURGERY PATIENT SIGNATURE_________________________________  NURSE SIGNATURE__________________________________  ________________________________________________________________________

## 2018-01-18 ENCOUNTER — Encounter (HOSPITAL_COMMUNITY): Payer: Self-pay

## 2018-01-18 ENCOUNTER — Other Ambulatory Visit: Payer: Self-pay

## 2018-01-18 ENCOUNTER — Encounter (HOSPITAL_COMMUNITY)
Admission: RE | Admit: 2018-01-18 | Discharge: 2018-01-18 | Disposition: A | Discharge status: Home / Self Care | Payer: Medicare Other | Source: Ambulatory Visit | Attending: Urology | Admitting: Urology

## 2018-01-18 DIAGNOSIS — Z01818 Encounter for other preprocedural examination: Secondary | ICD-10-CM | POA: Insufficient documentation

## 2018-01-18 DIAGNOSIS — D494 Neoplasm of unspecified behavior of bladder: Secondary | ICD-10-CM | POA: Insufficient documentation

## 2018-01-18 HISTORY — DX: Tachycardia, unspecified: R00.0

## 2018-01-18 HISTORY — DX: Essential (primary) hypertension: I10

## 2018-01-18 LAB — CBC
HCT: 34.8 % — ABNORMAL LOW (ref 36.0–46.0)
Hemoglobin: 11.2 g/dL — ABNORMAL LOW (ref 12.0–15.0)
MCH: 27.9 pg (ref 26.0–34.0)
MCHC: 32.2 g/dL (ref 30.0–36.0)
MCV: 86.6 fL (ref 78.0–100.0)
Platelets: 86 10*3/uL — ABNORMAL LOW (ref 150–400)
RBC: 4.02 MIL/uL (ref 3.87–5.11)
RDW: 13.8 % (ref 11.5–15.5)
WBC: 5.8 10*3/uL (ref 4.0–10.5)

## 2018-01-18 LAB — BASIC METABOLIC PANEL
ANION GAP: 9 (ref 5–15)
BUN: 17 mg/dL (ref 8–23)
CHLORIDE: 106 mmol/L (ref 98–111)
CO2: 26 mmol/L (ref 22–32)
Calcium: 10.5 mg/dL — ABNORMAL HIGH (ref 8.9–10.3)
Creatinine, Ser: 0.64 mg/dL (ref 0.44–1.00)
GFR calc Af Amer: >60 mL/min (ref >60)
GLUCOSE: 100 mg/dL — AB (ref 70–99)
POTASSIUM: 4.5 mmol/L (ref 3.5–5.1)
Sodium: 141 mmol/L (ref 135–145)

## 2018-01-18 NOTE — Progress Notes (Addendum)
cbc done 01-18-18 routed to Dr. Louis Meckel via epic.

## 2018-01-19 NOTE — Progress Notes (Signed)
Final ekg 01-18-18 in epic

## 2018-01-20 DIAGNOSIS — L218 Other seborrheic dermatitis: Secondary | ICD-10-CM | POA: Diagnosis not present

## 2018-01-20 DIAGNOSIS — L570 Actinic keratosis: Secondary | ICD-10-CM | POA: Diagnosis not present

## 2018-01-20 DIAGNOSIS — Z85828 Personal history of other malignant neoplasm of skin: Secondary | ICD-10-CM | POA: Diagnosis not present

## 2018-01-21 ENCOUNTER — Encounter: Payer: Self-pay | Admitting: Podiatry

## 2018-01-21 ENCOUNTER — Ambulatory Visit (INDEPENDENT_AMBULATORY_CARE_PROVIDER_SITE_OTHER): Payer: Medicare Other | Admitting: Podiatry

## 2018-01-21 DIAGNOSIS — M79676 Pain in unspecified toe(s): Secondary | ICD-10-CM

## 2018-01-21 DIAGNOSIS — B351 Tinea unguium: Secondary | ICD-10-CM

## 2018-01-21 NOTE — Progress Notes (Signed)
Patient ID: Darlene [name], female   DOB: 02/08/1935, 83 y.o.   MRN: [id number] Complaint:  Visit Type: Patient returns to my office for continued preventative foot care services. Complaint: Patient states" my nails have grown long and thick and become painful to walk and wear shoes".  He presents for preventative foot care services. No changes to ROS  Podiatric Exam: Vascular: dorsalis pedis and posterior tibial pulses are palpable bilateral. Capillary return is immediate. Temperature gradient is WNL. Skin turgor WNL  Sensorium: Normal Semmes Weinstein monofilament test. Normal tactile sensation bilaterally. Nail Exam: Pt has thick disfigured discolored nails with subungual debris noted bilateral entire nail hallux through fifth toenails Ulcer Exam: There is no evidence of ulcer or pre-ulcerative changes or infection. Orthopedic Exam: Muscle tone and strength are WNL. No limitations in general ROM. No crepitus or effusions noted. Hallux varus 1st MPJ right with hammer toes2,3 right. Bony prominences are unremarkable. Asymptomatic adducto varus fifth digits. Skin: No Porokeratosis. No infection or ulcers.  Diagnosis:  Tinea unguium, Pain in right toe, pain in left toes  Treatment & Plan Procedures and Treatment: Consent by patient was obtained for treatment procedures. The patient understood the discussion of treatment and procedures well. All questions were answered thoroughly reviewed. Debridement of mycotic and hypertrophic toenails, 1 through 5 bilateral and clearing of subungual debris. No ulceration, no infection noted.  Return Visit-Office Procedure: Patient instructed to return to the office for a follow up visit 10 weeks  for continued evaluation and treatment. 

## 2018-01-26 ENCOUNTER — Ambulatory Visit (HOSPITAL_COMMUNITY): Payer: Medicare Other

## 2018-01-26 ENCOUNTER — Ambulatory Visit (HOSPITAL_COMMUNITY): Payer: Medicare Other | Admitting: Certified Registered Nurse Anesthetist

## 2018-01-26 ENCOUNTER — Ambulatory Visit (HOSPITAL_COMMUNITY)
Admission: RE | Admit: 2018-01-26 | Discharge: 2018-01-26 | Disposition: A | Discharge status: Home / Self Care | Payer: Medicare Other | Source: Ambulatory Visit | Attending: Urology | Admitting: Urology

## 2018-01-26 ENCOUNTER — Encounter (HOSPITAL_COMMUNITY)
Admission: RE | Disposition: A | Discharge status: Home / Self Care | Payer: Self-pay | Source: Ambulatory Visit | Attending: Urology

## 2018-01-26 ENCOUNTER — Encounter (HOSPITAL_COMMUNITY): Payer: Self-pay

## 2018-01-26 DIAGNOSIS — Z885 Allergy status to narcotic agent status: Secondary | ICD-10-CM | POA: Insufficient documentation

## 2018-01-26 DIAGNOSIS — I10 Essential (primary) hypertension: Secondary | ICD-10-CM | POA: Insufficient documentation

## 2018-01-26 DIAGNOSIS — Z87891 Personal history of nicotine dependence: Secondary | ICD-10-CM | POA: Diagnosis not present

## 2018-01-26 DIAGNOSIS — D414 Neoplasm of uncertain behavior of bladder: Secondary | ICD-10-CM | POA: Diagnosis not present

## 2018-01-26 DIAGNOSIS — C672 Malignant neoplasm of lateral wall of bladder: Secondary | ICD-10-CM | POA: Diagnosis not present

## 2018-01-26 DIAGNOSIS — R3129 Other microscopic hematuria: Secondary | ICD-10-CM | POA: Diagnosis not present

## 2018-01-26 DIAGNOSIS — C670 Malignant neoplasm of trigone of bladder: Secondary | ICD-10-CM

## 2018-01-26 DIAGNOSIS — K219 Gastro-esophageal reflux disease without esophagitis: Secondary | ICD-10-CM | POA: Insufficient documentation

## 2018-01-26 DIAGNOSIS — E039 Hypothyroidism, unspecified: Secondary | ICD-10-CM | POA: Diagnosis not present

## 2018-01-26 DIAGNOSIS — J449 Chronic obstructive pulmonary disease, unspecified: Secondary | ICD-10-CM | POA: Diagnosis not present

## 2018-01-26 DIAGNOSIS — Z882 Allergy status to sulfonamides status: Secondary | ICD-10-CM | POA: Diagnosis not present

## 2018-01-26 DIAGNOSIS — D494 Neoplasm of unspecified behavior of bladder: Secondary | ICD-10-CM | POA: Diagnosis not present

## 2018-01-26 DIAGNOSIS — C679 Malignant neoplasm of bladder, unspecified: Secondary | ICD-10-CM | POA: Diagnosis not present

## 2018-01-26 DIAGNOSIS — Z79899 Other long term (current) drug therapy: Secondary | ICD-10-CM | POA: Insufficient documentation

## 2018-01-26 HISTORY — PX: TRANSURETHRAL RESECTION OF BLADDER TUMOR WITH MITOMYCIN-C: SHX6459

## 2018-01-26 SURGERY — TRANSURETHRAL RESECTION OF BLADDER TUMOR WITH MITOMYCIN-C
Anesthesia: General | Laterality: Bilateral

## 2018-01-26 MED ORDER — FENTANYL CITRATE (PF) 100 MCG/2ML IJ SOLN
INTRAMUSCULAR | Status: DC | PRN
  Administered 2018-01-26 (×2): 50 ug via INTRAVENOUS

## 2018-01-26 MED ORDER — PHENAZOPYRIDINE HCL 200 MG PO TABS: 200.0000 mg | ORAL_TABLET | Freq: Three times a day (TID) | ORAL | 0 refills | Status: DC | PRN

## 2018-01-26 MED ORDER — DEXAMETHASONE SODIUM PHOSPHATE 10 MG/ML IJ SOLN
INTRAMUSCULAR | Status: AC
  Filled 2018-01-26: qty 1

## 2018-01-26 MED ORDER — GEMCITABINE CHEMO FOR BLADDER INSTILLATION 2000 MG
INTRAVENOUS | Status: DC | PRN
  Administered 2018-01-26: 2000 mg via INTRAVESICAL

## 2018-01-26 MED ORDER — PROPOFOL 10 MG/ML IV BOLUS
INTRAVENOUS | Status: DC | PRN
  Administered 2018-01-26: 100 mg via INTRAVENOUS

## 2018-01-26 MED ORDER — PHENYLEPHRINE 40 MCG/ML (10ML) SYRINGE FOR IV PUSH (FOR BLOOD PRESSURE SUPPORT)
PREFILLED_SYRINGE | INTRAVENOUS | Status: DC | PRN
  Administered 2018-01-26: 80 ug via INTRAVENOUS
  Administered 2018-01-26: 40 ug via INTRAVENOUS

## 2018-01-26 MED ORDER — FENTANYL CITRATE (PF) 100 MCG/2ML IJ SOLN
INTRAMUSCULAR | Status: AC
  Filled 2018-01-26: qty 2

## 2018-01-26 MED ORDER — ONDANSETRON HCL 4 MG/2ML IJ SOLN: 4.0000 mg | Freq: Once | INTRAMUSCULAR | Status: DC | PRN

## 2018-01-26 MED ORDER — IOHEXOL 300 MG/ML  SOLN
INTRAMUSCULAR | Status: DC | PRN
  Administered 2018-01-26: 20 mL via URETHRAL

## 2018-01-26 MED ORDER — PHENYLEPHRINE 40 MCG/ML (10ML) SYRINGE FOR IV PUSH (FOR BLOOD PRESSURE SUPPORT)
PREFILLED_SYRINGE | INTRAVENOUS | Status: AC
  Filled 2018-01-26: qty 10

## 2018-01-26 MED ORDER — CEFAZOLIN SODIUM-DEXTROSE 2-4 GM/100ML-% IV SOLN
2.0000 g | INTRAVENOUS | Status: AC
  Administered 2018-01-26: 2 g via INTRAVENOUS
  Filled 2018-01-26: qty 100

## 2018-01-26 MED ORDER — TRAMADOL HCL 50 MG PO TABS: 50.0000 mg | ORAL_TABLET | Freq: Four times a day (QID) | ORAL | 0 refills | Status: DC | PRN

## 2018-01-26 MED ORDER — BELLADONNA ALKALOIDS-OPIUM 16.2-60 MG RE SUPP
RECTAL | Status: DC | PRN
  Administered 2018-01-26: 1 via RECTAL

## 2018-01-26 MED ORDER — STERILE WATER FOR IRRIGATION IR SOLN
Status: DC | PRN
  Administered 2018-01-26: 3000 mL

## 2018-01-26 MED ORDER — PROPOFOL 10 MG/ML IV BOLUS
INTRAVENOUS | Status: AC
  Filled 2018-01-26: qty 20

## 2018-01-26 MED ORDER — FENTANYL CITRATE (PF) 100 MCG/2ML IJ SOLN
25.0000 ug | INTRAMUSCULAR | Status: DC | PRN
  Administered 2018-01-26 (×3): 25 ug via INTRAVENOUS

## 2018-01-26 MED ORDER — ONDANSETRON HCL 4 MG/2ML IJ SOLN
INTRAMUSCULAR | Status: DC | PRN
  Administered 2018-01-26: 4 mg via INTRAVENOUS

## 2018-01-26 MED ORDER — LACTATED RINGERS IV SOLN
INTRAVENOUS | Status: DC
  Administered 2018-01-26: 1000 mL via INTRAVENOUS

## 2018-01-26 MED ORDER — GEMCITABINE CHEMO FOR BLADDER INSTILLATION 2000 MG
2000.0000 mg | Freq: Once | INTRAVENOUS | Status: DC
  Filled 2018-01-26: qty 52.6

## 2018-01-26 MED ORDER — BELLADONNA ALKALOIDS-OPIUM 16.2-30 MG RE SUPP
RECTAL | Status: AC
  Filled 2018-01-26: qty 1

## 2018-01-26 MED ORDER — EPHEDRINE SULFATE-NACL 50-0.9 MG/10ML-% IV SOSY
PREFILLED_SYRINGE | INTRAVENOUS | Status: DC | PRN
  Administered 2018-01-26: 10 mg via INTRAVENOUS

## 2018-01-26 MED ORDER — LIDOCAINE 2% (20 MG/ML) 5 ML SYRINGE
INTRAMUSCULAR | Status: DC | PRN
  Administered 2018-01-26: 60 mg via INTRAVENOUS

## 2018-01-26 MED ORDER — GEMCITABINE CHEMO FOR BLADDER INSTILLATION 2000 MG: 2000.0000 mg | Freq: Once | INTRAVENOUS | Status: DC

## 2018-01-26 MED ORDER — LIDOCAINE 2% (20 MG/ML) 5 ML SYRINGE
INTRAMUSCULAR | Status: AC
  Filled 2018-01-26: qty 5

## 2018-01-26 MED ORDER — ONDANSETRON HCL 4 MG/2ML IJ SOLN
INTRAMUSCULAR | Status: AC
  Filled 2018-01-26: qty 2

## 2018-01-26 MED ORDER — DEXAMETHASONE SODIUM PHOSPHATE 10 MG/ML IJ SOLN
INTRAMUSCULAR | Status: DC | PRN
  Administered 2018-01-26: 10 mg via INTRAVENOUS

## 2018-01-26 SURGICAL SUPPLY — 19 items
BAG URINE DRAINAGE (UROLOGICAL SUPPLIES) IMPLANT
BAG URO CATCHER STRL LF (MISCELLANEOUS) ×3 IMPLANT
CATH FOLEY 2WAY SLVR 30CC 24FR (CATHETERS) IMPLANT
ELECT REM PT RETURN 15FT ADLT (MISCELLANEOUS) ×3 IMPLANT
EVACUATOR MICROVAS BLADDER (UROLOGICAL SUPPLIES) IMPLANT
GLOVE BIOGEL M 8.0 STRL (GLOVE) ×9 IMPLANT
GOWN STRL REUS W/ TWL XL LVL3 (GOWN DISPOSABLE) ×1 IMPLANT
GOWN STRL REUS W/TWL XL LVL3 (GOWN DISPOSABLE) ×6 IMPLANT
LOOP CUT BIPOLAR 24F LRG (ELECTROSURGICAL) IMPLANT
MANIFOLD NEPTUNE II (INSTRUMENTS) ×3 IMPLANT
NDL SAFETY ECLIPSE 18X1.5 (NEEDLE) IMPLANT
NEEDLE HYPO 18GX1.5 SHARP (NEEDLE)
PACK CYSTO (CUSTOM PROCEDURE TRAY) ×3 IMPLANT
SET ASPIRATION TUBING (TUBING) IMPLANT
SYRINGE IRR TOOMEY STRL 70CC (SYRINGE) IMPLANT
TUBING CONNECTING 10 (TUBING) ×2 IMPLANT
TUBING CONNECTING 10' (TUBING) ×1
TUBING UROLOGY SET (TUBING) IMPLANT
WATER STERILE IRR 3000ML UROMA (IV SOLUTION) IMPLANT

## 2018-01-26 NOTE — Op Note (Signed)
Preoperative diagnosis:  1. Bladder mass, 5 mm  Postoperative diagnosis:  1. Same  Procedure: 1. Retrograde pyelogram with interpretation, bilateral 2. Transurethral resection of bladder tumor, 5 mm 3. Postoperative intravesical instillation of gemcitabine  Surgeon: Ardis Hughs, MD  Anesthesia: General  Complications: None  Intraoperative findings: 1.:  The left retrograde pyelogram was performed using a 5 French open-ended ureteral catheter and 10 cc of Omnipaque contrast under fluoroscopic guidance.  This demonstrated normal caliber ureter with no filling defects or hydroureteronephrosis.  The calyces were sharp. 2: The right retrograde pyelogram was performed using a 5 [place] open-ended ureteral catheter and 10 cc of Omnipaque contrast demonstrating a normal caliber ureter with no filling defects.  The calyces were sharp and there was no hydronephrosis. 3: The patient had a 5 mm tumor at the left lateral wall just lateral to the ureteral orifice/trigone region.  EBL: Minimal  Specimens: Bladder tumor left lateral wall  Indication: Darlene [name] is a 83 y.o. patient with microscopic hematuria, who under evaluation for her hematuria was found to have a small left lateral wall bladder tumor.  After reviewing the management options for treatment, he elected to proceed with the above surgical procedure(s). We have discussed the potential benefits and risks of the procedure, side effects of the proposed treatment, the likelihood of the patient achieving the goals of the procedure, and any potential problems that might occur during the procedure or recuperation. Informed consent has been obtained.  Description of procedure:  The patient was taken to the operating room and general anesthesia was induced.  The patient was placed in the dorsal lithotomy position, prepped and draped in the usual sterile fashion, and preoperative antibiotics were administered. A preoperative time-out  was performed.   21 French 30 degrees cystoscope was gently passed through the patient's urethra and into the bladder under visual guidance.  360 degrees cystoscopic evaluation was performed with the above findings.  A 70 degree scope was then exchanged for 30 degree lens and the cystoscopic evaluation was performed again with no additional findings.  The 30 degree was then replaced and retrograde pyelograms performed as described above the above findings.  I then exchanged the cystoscopic bridge for a rigid biopsy forceps and was able to remove the bladder tumor with the biopsy forceps.  I took the specimen in the first biopsy and then the base of the specimen on the second biopsy.  The area was then copiously fulgurated with Bugbee cautery.  An 78 French Foley catheter was then placed in the patient's bladder.  A B&O suppository was placed in her rectum.  In the PACU, 50 mL's with 50 mg of gemcitabine was instilled into the patient's bladder through an 64 French Foley catheter.  This was allowed to dwell for 60-90 minutes prior to emptying the Foley catheter and removing it.   Ardis Hughs, M.D.

## 2018-01-26 NOTE — Interval H&P Note (Signed)
History and Physical Interval Note:  01/26/2018 12:09 PM  Darlene [name]  has presented today for surgery, with the diagnosis of BLADDER TUMOR  The various methods of treatment have been discussed with the patient and family. After consideration of risks, benefits and other options for treatment, the patient has consented to  Procedure(s): TRANSURETHRAL RESECTION OF BLADDER TUMOR WITH GEMCITABIN BLLATERAL RETROGRADE PYELOGRAM (Bilateral) as a surgical intervention .  The patient's history has been reviewed, patient examined, no change in status, stable for surgery.  I have reviewed the patient's chart and labs.  Questions were answered to the patient's satisfaction.     Louis Meckel W

## 2018-01-26 NOTE — H&P (Signed)
Asymptomatic microscopic hematuria  HPI: Darlene [name] is a 83 year-old female patient who was referred by Dr. Deland Pretty, MD who is here for further eval and management of microscopic hematuria.  She did not see the blood in her urine. She has not seen blood clots.   She is not having pain. She does not have a burning sensation when she urinates. She is not currently having trouble urinating. The patient has developed frequency and urgency. She does not have a history of urinary infections. He does not have a history of prostatitis. She has not recently had unwanted weight loss.   They do not have a history of kidney stones. The patient has no family history of GU malignancy. The patient is a former smoker. She has not been exposed to occupational hazards that may increase their risks for developing cancer.     ALLERGIES: Codeine Derivatives Sulfa Drugs    MEDICATIONS: Azithromycin 250 mg tablet Oral  Daily Vitamin tablet Oral  Evista 60 mg tablet Oral  Famciclovir 125 mg tablet Oral  Lovastatin 20 mg tablet Oral  Meloxicam 7.5 mg tablet Oral  Nexium 40 mg capsule,delayed release Oral  Oyster Shell + D 250 mg calcium (625 mg)-125 unit tablet Oral  Serevent Diskus 50 mcg blister, with inhalation device Inhalation  Synthroid 75 mcg tablet Oral  Tears Naturale-Ii drops Ophthalmic     GU PSH: None     PSH Notes: Breast Surgery Lumpectomy, Breast Surgery, Knee Surgery, Foot Surgery   NON-GU PSH: Breast Surgery Procedure - 2007 Remove Breast Lesion - 2007    GU PMH: None     PMH Notes: V cell low grade lympoma- history of breast cancer  1898-05-04 00:00:00 - Note: Normal Routine History And Physical Senior Citizen [phone]  2006-05-03 16:24:47 - Note: Thyroid Disorder  2006-04-21 14:04:48 - Note: Dry Eyes  2006-04-21 14:04:48 - Note: Breast Cancer   NON-GU PMH: Bunion of unspecified foot, Bunion - 2014 Cervical disc disorder, unspecified, unspecified cervical region,  Cervical Disc Degeneration - 2014 Non-Hodgkin lymphoma, unspecified, unspecified site, Non-Hodgkin's Lymphoma - 2014, Non-Hodgkin's Lymphoma, - 2014 Personal history of other diseases of the musculoskeletal system and connective tissue, History of osteopenia - 2014 Personal history of other endocrine, nutritional and metabolic disease, History of hypothyroidism - 2014 Personal history of other infectious and parasitic diseases, History of herpes genitalis - 2014, History of herpes zoster, - 2014 Breast Cancer, History    FAMILY HISTORY: Acute Myocardial Infarction - Father Family Health Status Number - Runs In Family   SOCIAL HISTORY: None    Notes: Occupation:, Tobacco Use, Death In The Family Mother, Death In The Family Father, Caffeine Use, Alcohol Use, Marital History - Currently Married   REVIEW OF SYSTEMS:    GU Review Female:   Patient denies frequent urination, hard to postpone urination, burning /pain with urination, get up at night to urinate, leakage of urine, stream starts and stops, trouble starting your stream, have to strain to urinate, and being pregnant.  Gastrointestinal (Upper):   Patient denies nausea, vomiting, and indigestion/ heartburn.  Gastrointestinal (Lower):   Patient denies diarrhea and constipation.  Constitutional:   Patient denies fever, night sweats, weight loss, and fatigue.  Skin:   Patient denies skin rash/ lesion and itching.  Eyes:   Patient denies double vision and blurred vision.  Ears/ Nose/ Throat:   Patient denies sore throat and sinus problems.  Hematologic/Lymphatic:   Patient denies swollen glands and easy bruising.  Cardiovascular:  Patient denies leg swelling and chest pains.  Respiratory:   Patient denies cough and shortness of breath.  Endocrine:   Patient denies excessive thirst.  Musculoskeletal:   Patient denies back pain and joint pain.  Neurological:   Patient denies headaches and dizziness.  Psychologic:   Patient denies depression  and anxiety.   VITAL SIGNS:      12/27/2017 10:45 AM  Weight 175 lb / 79.38 kg  Height 62 in / 157.48 cm  BP 116/77 mmHg  Pulse 74 /min  Temperature 97.8 F / 36.5 C  BMI 32.0 kg/m   GU PHYSICAL EXAMINATION:    External Genitalia: No hirsutism, no rash, no scarring, no cyst, no erythematous lesion, no papular lesion, no blanched lesion, no warty lesion. No edema.  Urethral Meatus: Mild urethral retraction. Normal size. No discharge.   Urethra: No tenderness, no mass, no scarring. No urethral hypermobility. No leakage.   Bladder: Normal to palpation, no tenderness, no mass, normal size.  Vagina: Moderate introital stenosis. No atrophy. No rectocele. No cystocele. No enterocele.   Anus and Perineum: No hemorrhoids. No anal stenosis. No rectal fissure, no anal fissure. No edema, no dimple, no perineal tenderness, no anal tenderness.   MULTI-SYSTEM PHYSICAL EXAMINATION:    Constitutional: Well-nourished. No physical deformities. Normally developed. Good grooming.  Neck: Neck symmetrical, not swollen. Normal tracheal position.  Respiratory: No labored breathing, no use of accessory muscles.   Cardiovascular: Normal temperature, normal extremity pulses, no swelling, no varicosities.  Lymphatic: No enlargement of neck, axillae, groin.  Skin: No paleness, no jaundice, no cyanosis. No lesion, no ulcer, no rash.  Neurologic / Psychiatric: Oriented to time, oriented to place, oriented to person. No depression, no anxiety, no agitation.  Gastrointestinal: No mass, no tenderness, no rigidity, non obese abdomen.  Eyes: Normal conjunctivae. Normal eyelids.  Ears, Nose, Mouth, and Throat: Left ear no scars, no lesions, no masses. Right ear no scars, no lesions, no masses. Nose no scars, no lesions, no masses. Normal hearing. Normal lips.  Musculoskeletal: Normal gait and station of head and neck.     PAST DATA REVIEWED:  Source Of History:  Patient  Records Review:   Previous Doctor Records,  Previous Patient Records, POC Tool   PROCEDURES:         Flexible Cystoscopy - 52000  Risks, benefits, and some of the potential complications of the procedure were discussed at length with the patient including infection, bleeding, voiding discomfort, urinary retention, fever, chills, sepsis, and others. All questions were answered. Informed consent was obtained. Antibiotic prophylaxis was given. Sterile technique and intraurethral analgesia were used.  Meatus:  Normal size. Normal location. Normal condition.  Urethra:  No hypermobility. No leakage.  Ureteral Orifices:  Normal location. Normal size. Normal shape. Effluxed clear urine.  Bladder:  No trabeculation. There is a 5 mm lesion on the left lateral wall of the patient's bladder consistent with low-grade papillary transitional cell carcinoma. Normal mucosa. No stones.      The lower urinary tract was carefully examined. The procedure was well-tolerated and without complications. Antibiotic instructions were given. Instructions were given to call the office immediately for bloody urine, difficulty urinating, urinary retention, painful or frequent urination, fever, chills, nausea, vomiting or other illness. The patient stated that she understood these instructions and would comply with them.         Urinalysis w/Scope Dipstick Dipstick Cont'd Micro  Color: Yellow Bilirubin: Neg mg/dL WBC/hpf: NS (Not Seen)  Appearance: Clear Ketones: Neg  mg/dL RBC/hpf: 0 - 2/hpf  Specific Gravity: 1.015 Blood: 2+ ery/uL Bacteria: Rare (0-9/hpf)  pH: 6.0 Protein: Neg mg/dL Cystals: NS (Not Seen)  Glucose: Neg mg/dL Urobilinogen: 0.2 mg/dL Casts: NS (Not Seen)    Nitrites: Neg Trichomonas: Not Present    Leukocyte Esterase: Neg leu/uL Mucous: Not Present      Epithelial Cells: 0 - 5/hpf      Yeast: NS (Not Seen)      Sperm: Not Present    ASSESSMENT:      ICD-10 Details  1 GU:   Microscopic hematuria - R31.21   2   Bladder Cancer Lateral - C67.2     PLAN:           Document Letter(s):  Created for Patient: Clinical Summary         Notes:   Unfortunately, on the patient's cystoscopy today I noted a low-grade lesion on the left lateral aspect of her bladder consistent with low-grade bladder cancer. I spoke to the patient about the diagnosis, and the treatment options. I recommended that we proceed to the operating room for transurethral resection of the bladder tumor and bilateral retrograde pyelograms. In addition, I think it would be beneficial for her to instill intravesical chemotherapy following the procedure to prevent recurrence.   Prior to proceeding to the operating room, would like the patient to be cleared by her primary care provider. We'll try to get this scheduled as soon as possible pending clearance.

## 2018-01-26 NOTE — Anesthesia Procedure Notes (Signed)
Procedure Name: Intubation Date/Time: 01/26/2018 12:21 PM Performed by: Mitzie Na, CRNA Pre-anesthesia Checklist: Patient identified, Emergency Drugs available, Suction available, Patient being monitored and Timeout performed Patient Re-evaluated:Patient Re-evaluated prior to induction Oxygen Delivery Method: Circle system utilized Preoxygenation: Pre-oxygenation with 100% oxygen Induction Type: IV induction LMA: LMA inserted LMA Size: 3.0 Number of attempts: 1 Placement Confirmation: positive ETCO2 and breath sounds checked- equal and bilateral Tube secured with: Tape Dental Injury: Teeth and Oropharynx as per pre-operative assessment

## 2018-01-26 NOTE — Progress Notes (Signed)
Pt voided qs x 3; tolerated fluids well, denies need for pain med; ambulated well

## 2018-01-26 NOTE — Anesthesia Preprocedure Evaluation (Signed)
Anesthesia Evaluation  Patient identified by MRN, date of birth, ID band Patient awake    Reviewed: Allergy & Precautions, NPO status , Patient's Chart, lab work & pertinent test results, reviewed documented beta blocker date and time   Airway Mallampati: II  TM Distance: >3 FB Neck ROM: Full    Dental  (+) Dental Advisory Given, Implants, Caps   Pulmonary COPD, former smoker,    Pulmonary exam normal breath sounds clear to auscultation       Cardiovascular hypertension, Pt. on home beta blockers + angina Normal cardiovascular exam Rhythm:Regular Rate:Normal     Neuro/Psych PSYCHIATRIC DISORDERS Anxiety Depression  Neuromuscular disease    GI/Hepatic Neg liver ROS, GERD  ,  Endo/Other  Hypothyroidism Obesity   Renal/GU negative Renal ROS   BLADDER TUMOR    Musculoskeletal  (+) Arthritis ,   Abdominal   Peds  Hematology  (+) Blood dyscrasia (ITP), anemia ,   Anesthesia Other Findings Day of surgery medications reviewed with the patient.  Reproductive/Obstetrics                            Anesthesia Physical Anesthesia Plan  ASA: III  Anesthesia Plan: General   Post-op Pain Management:    Induction: Intravenous  PONV Risk Score and Plan: 4 or greater and Ondansetron, Dexamethasone and Treatment may vary due to age or medical condition  Airway Management Planned: LMA  Additional Equipment:   Intra-op Plan:   Post-operative Plan: Extubation in OR  Informed Consent: I have reviewed the patients History and Physical, chart, labs and discussed the procedure including the risks, benefits and alternatives for the proposed anesthesia with the patient or authorized representative who has indicated his/her understanding and acceptance.   Dental advisory given  Plan Discussed with: CRNA  Anesthesia Plan Comments:         Anesthesia Quick Evaluation

## 2018-01-26 NOTE — Discharge Instructions (Signed)

## 2018-01-26 NOTE — Transfer of Care (Signed)
Immediate Anesthesia Transfer of Care Note  Patient: Darlene [name]  Procedure(s) Performed: TRANSURETHRAL RESECTION OF BLADDER TUMOR WITH GEMCITABIN BLLATERAL RETROGRADE PYELOGRAM (Bilateral )  Patient Location: PACU  Anesthesia Type:General  Level of Consciousness: awake, alert , oriented and patient cooperative  Airway & Oxygen Therapy: Patient Spontanous Breathing and Patient connected to face mask oxygen  Post-op Assessment: Report given to RN, Post -op Vital signs reviewed and stable and Patient moving all extremities  Post vital signs: Reviewed and stable  Last Vitals:  Vitals Value Taken Time  BP 143/81 01/26/2018 12:55 PM  Temp    Pulse 75 01/26/2018 12:58 PM  Resp 15 01/26/2018 12:58 PM  SpO2 98 % 01/26/2018 12:58 PM  Vitals shown include unvalidated device data.  Last Pain:  Vitals:   01/26/18 1110  TempSrc:   PainSc: 0-No pain      Patients Stated Pain Goal: 4 (32/44/01 0272)  Complications: No apparent anesthesia complications

## 2018-01-27 ENCOUNTER — Emergency Department (HOSPITAL_COMMUNITY): Payer: Medicare Other

## 2018-01-27 ENCOUNTER — Other Ambulatory Visit: Payer: Self-pay

## 2018-01-27 ENCOUNTER — Encounter (HOSPITAL_COMMUNITY): Payer: Self-pay | Admitting: Urology

## 2018-01-27 ENCOUNTER — Emergency Department (HOSPITAL_COMMUNITY)
Admission: EM | Admit: 2018-01-27 | Discharge: 2018-01-27 | Disposition: A | Discharge status: Home / Self Care | Payer: Medicare Other | Attending: Emergency Medicine | Admitting: Emergency Medicine

## 2018-01-27 DIAGNOSIS — Y92003 Bedroom of unspecified non-institutional (private) residence as the place of occurrence of the external cause: Secondary | ICD-10-CM | POA: Insufficient documentation

## 2018-01-27 DIAGNOSIS — E039 Hypothyroidism, unspecified: Secondary | ICD-10-CM | POA: Insufficient documentation

## 2018-01-27 DIAGNOSIS — I10 Essential (primary) hypertension: Secondary | ICD-10-CM | POA: Diagnosis not present

## 2018-01-27 DIAGNOSIS — Z87891 Personal history of nicotine dependence: Secondary | ICD-10-CM | POA: Diagnosis not present

## 2018-01-27 DIAGNOSIS — S4982XA Other specified injuries of left shoulder and upper arm, initial encounter: Secondary | ICD-10-CM | POA: Diagnosis not present

## 2018-01-27 DIAGNOSIS — Y999 Unspecified external cause status: Secondary | ICD-10-CM | POA: Diagnosis not present

## 2018-01-27 DIAGNOSIS — Y9389 Activity, other specified: Secondary | ICD-10-CM | POA: Insufficient documentation

## 2018-01-27 DIAGNOSIS — S4992XA Unspecified injury of left shoulder and upper arm, initial encounter: Secondary | ICD-10-CM | POA: Diagnosis not present

## 2018-01-27 DIAGNOSIS — W06XXXA Fall from bed, initial encounter: Secondary | ICD-10-CM | POA: Diagnosis not present

## 2018-01-27 DIAGNOSIS — Z79899 Other long term (current) drug therapy: Secondary | ICD-10-CM | POA: Insufficient documentation

## 2018-01-27 DIAGNOSIS — Z7982 Long term (current) use of aspirin: Secondary | ICD-10-CM | POA: Diagnosis not present

## 2018-01-27 DIAGNOSIS — M25512 Pain in left shoulder: Secondary | ICD-10-CM | POA: Diagnosis not present

## 2018-01-27 DIAGNOSIS — Z853 Personal history of malignant neoplasm of breast: Secondary | ICD-10-CM | POA: Insufficient documentation

## 2018-01-27 DIAGNOSIS — Z96651 Presence of right artificial knee joint: Secondary | ICD-10-CM | POA: Insufficient documentation

## 2018-01-27 DIAGNOSIS — J449 Chronic obstructive pulmonary disease, unspecified: Secondary | ICD-10-CM | POA: Diagnosis not present

## 2018-01-27 MED ORDER — DICLOFENAC SODIUM 1 % TD GEL: 2.0000 g | Freq: Four times a day (QID) | TRANSDERMAL | 0 refills | Status: DC

## 2018-01-27 NOTE — ED Notes (Signed)
ED Provider at bedside. 

## 2018-01-27 NOTE — ED Notes (Signed)
Patient transported to CT 

## 2018-01-27 NOTE — ED Triage Notes (Signed)
Per Pt: Pt reports she fell and hurt her left collar bone this morning. Pt reports she fell this morning around 4am.

## 2018-01-27 NOTE — ED Provider Notes (Signed)
[facility] DEPT Provider Note   CSN: [id number] Arrival date & time: 01/27/18  1042     History   Chief Complaint Chief Complaint  Patient presents with  . Fall  . Shoulder Injury    HPI Darlene [name] is a 83 y.o. female.  Patient is an 83 year old female with a history of lymphoma, hypertension, ITP, breast cancer, COPD who recently had a tumor removed from her bladder yesterday who presents today after falling out of bed approximately 3 AM this morning.  Patient states she was going to the bathroom and when she got back to her bed she half of her body was in the bed and she lifted up her leg to scoot in and she slipped out of the bed hitting her left collarbone on the corner of the bed.  Since that time she has had significant pain when she tries to lift her arm but denies any shortness of breath, numbness or tingling in her left arm.  She denies hitting her head or loss of consciousness.  No new neck pain.  She is been able to walk without difficulty.  The history is provided by the patient.    Past Medical History:  Diagnosis Date  . Anxiety   . Breast cancer, stage 1, estrogen receptor negative ([place]) 07/06/2011  . Cancer of breast ([place])   . Cataract   . Chronic ITP (idiopathic thrombocytopenia) (H[facility]) 07/06/2011  . Constipation    takes Colace daily  . COPD (chronic obstructive pulmonary disease) (HCC)    mild  . Depression    "nervous break down age 69"  . Fast heart beat   . GERD (gastroesophageal reflux disease)    takes Nexium daily  . History of colon polyps   . History of depression    received shock treatments   . History of palpitations    takes Metoprolol daily  . History of vertigo   . HOH (hard of hearing)    has hearing aids  . Hyperlipidemia    takes Lovastatin daily  . Hypertension   . Hypothyroid 07/06/2011   takes SYnthroid daily  . Joint pain   . Joint swelling   . Lymphoma ([place])   . Lymphoma ([place])    B cell     . Lymphoma of lymph nodes of head, face, and/or neck ([place]) 07/06/2011  . Neuropathy   . Nocturia   . Osteopenia   . Osteoporosis    hx of and take Reclast  . Peripheral neuropathy   . Personal history of chemotherapy   . Personal history of radiation therapy   . Right knee DJD   . Shortness of breath dyspnea    with exertion  . Thyroid disease   . Umbilical hernia     Patient Active Problem List   Diagnosis Date Noted  . Anemia, chronic disease 07/27/2017  . Weight loss 11/19/2015  . Chronic constipation 11/19/2015  . Other specified hypothyroidism 11/14/2015  . DJD (degenerative joint disease) of knee 06/19/2013  . Right knee DJD   . Hyperlipidemia   . Thyroid disease   . Chest pain 12/26/2012  . Unstable angina ([place]) 12/03/2012  . Osteoporosis 12/03/2012  . Palpitations 12/03/2012  . Dyslipidemia 12/03/2012  . History of breast cancer in female 07/06/2011  . History of non-Hodgkin's lymphoma 07/06/2011  . Hypothyroid 07/06/2011  . Chronic ITP (idiopathic thrombocytopenia) (HCC) 07/06/2011    Past Surgical History:  Procedure Laterality Date  . ADENOIDECTOMY  as a child  . BREAST LUMPECTOMY Right 1994  . BREAST SURGERY Right   . BUNIONECTOMY Right   . CARDIAC CATHETERIZATION  2014  . COLONOSCOPY    . FOOT SURGERY Right   . FRACTURE SURGERY     wrist right , right knee  . INSERTION OF MESH N/A 04/17/2015   Procedure: INSERTION OF MESH;  Surgeon: Ralene Ok, MD;  Location: [facility];  Service: General;  Laterality: N/A;  . KNEE SURGERY    . LEFT HEART CATHETERIZATION WITH CORONARY ANGIOGRAM N/A 12/05/2012   Procedure: LEFT HEART CATHETERIZATION WITH CORONARY ANGIOGRAM;  Surgeon: [name] Sine, MD;  Location: [facility] CATH LAB;  Service: Cardiovascular;  Laterality: N/A;  . right  breast surgery     d/t breast cancer  . TONSILLECTOMY     as a child  . TOTAL KNEE ARTHROPLASTY Right 06/19/2013   Procedure: TOTAL KNEE ARTHROPLASTY;  Surgeon: Lorn Junes, MD;   Location: [facility];  Service: Orthopedics;  Laterality: Right;  . TRANSURETHRAL RESECTION OF BLADDER TUMOR WITH GYRUS (TURBT-GYRUS)     01-26-18 Dr. Louis Meckel  . TRANSURETHRAL RESECTION OF BLADDER TUMOR WITH MITOMYCIN-C Bilateral 01/26/2018   Procedure: TRANSURETHRAL RESECTION OF BLADDER TUMOR WITH GEMCITABIN BLLATERAL RETROGRADE PYELOGRAM;  Surgeon: Ardis Hughs, MD;  Location: [facility];  Service: Urology;  Laterality: Bilateral;  . TURBINATE REDUCTION    . UMBILICAL HERNIA REPAIR N/A 04/17/2015   Procedure: LAPAROSCOPIC UMBILICAL HERNIA REPAIR WITH MESH;  Surgeon: Ralene Ok, MD;  Location: [facility];  Service: General;  Laterality: N/A;  . UPPER GASTROINTESTINAL ENDOSCOPY       OB History   None      Home Medications    Prior to Admission medications   Medication Sig Start Date End Date Taking? Authorizing Provider  acetaminophen (TYLENOL) 500 MG tablet Take 1 tablet (500 mg total) by mouth every 6 (six) hours as needed for mild pain. Patient taking differently: Take 1,000 mg by mouth every 8 (eight) hours as needed for mild pain.  04/19/16   Quintella Reichert, MD  amoxicillin (AMOXIL) 500 MG capsule Take 500 mg by mouth once as needed (Take 4 capsules by mouth one hour prior to dental appointment).    [provider]  aspirin 81 MG tablet Take 81 mg by mouth daily.    [provider]  buPROPion (WELLBUTRIN XL) 150 MG 24 hr tablet Take 150 mg by mouth daily. 06/24/17   [provider]  Calcium Carbonate-Vit D-Min (CALTRATE 600+D PLUS MINERALS) 600-800 MG-UNIT CHEW Chew 2 each by mouth 2 (two) times daily.    [provider]  cholecalciferol (VITAMIN D) 1000 UNITS tablet Take 1,000 Units by mouth daily.    [provider]  citalopram (CELEXA) 40 MG tablet Take 40 mg by mouth daily.    [provider]  Clobetasol Propionate 0.05 % shampoo  12/29/17   [provider]  Cyanocobalamin (VITAMIN B-12 PO) Take 1,000 mcg by mouth  daily.    [provider]  esomeprazole (NEXIUM) 40 MG capsule Take 40 mg by mouth 2 (two) times daily before a meal.     [provider]  famciclovir (FAMVIR) 125 MG tablet Take 250 mg by mouth 2 (two) times daily as needed. For 5 days as needed for outbreak    [provider]  fluocinonide (LIDEX) 0.05 % external solution  01/20/18   [provider]  L-Methylfolate-Algae-B12-B6 Glade Stanford) 3-90.314-2-35 MG CAPS Take 1 tablet by mouth 2 (two)  times daily. 03/20/15   [provider]  levothyroxine (SYNTHROID, LEVOTHROID) 75 MCG tablet Take 75 mcg by mouth daily before breakfast.     [provider]  lovastatin (MEVACOR) 20 MG tablet Take 20 mg by mouth at bedtime.      [provider]  LYRICA 50 MG capsule Take 50 mg by mouth 2 (two) times daily.  12/25/14   [provider]  magnesium oxide (MAG-OX) 400 MG tablet Take 400 mg by mouth daily.    [provider]  metoprolol succinate (TOPROL-XL) 25 MG 24 hr tablet Take 25 mg by mouth daily.  11/22/12   [provider]  Multiple Vitamins-Minerals (MULTIVITAMINS THER. W/MINERALS) TABS Take 1 tablet by mouth daily.      [provider]  nystatin-triamcinolone ointment Lilyan Gilford)  12/20/17   [provider]  phenazopyridine (PYRIDIUM) 200 MG tablet Take 1 tablet (200 mg total) by mouth 3 (three) times daily as needed for pain. 01/26/18   Ardis Hughs, MD  Polyethyl Glycol-Propyl Glycol (SYSTANE) 0.4-0.3 % SOLN Apply 2 drops to eye 2 (two) times daily as needed (for dry eyes).    [provider]  traMADol (ULTRAM) 50 MG tablet Take 1-2 tablets (50-100 mg total) by mouth every 6 (six) hours as needed for moderate pain. 01/26/18   Ardis Hughs, MD  Vitamin D, Ergocalciferol, (DRISDOL) 50000 UNITS CAPS Take 50,000 Units by mouth every 14 (fourteen) days.      [provider]    Family History Family History  Problem Relation Age  of Onset  . Hyperlipidemia Father   . Heart disease Father   . Heart disease Sister   . Cancer Brother   . Diabetes Son   . Heart disease Maternal Grandfather   . Colon cancer Neg Hx   . Breast cancer Neg Hx     Social History Social History   Tobacco Use  . Smoking status: Former Smoker    Packs/day: 0.30    Types: Cigarettes  . Smokeless tobacco: Never Used  . Tobacco comment: quit smoking 1985  was a casual smoker  Substance Use Topics  . Alcohol use: No    Alcohol/week: 0.0 standard drinks  . Drug use: No     Allergies   Oxycodone; Codeine; Doxycycline; Meperidine and related; Other; Sulfa antibiotics; and Sulfacetamide sodium   Review of Systems Review of Systems  All other systems reviewed and are negative.    Physical Exam Updated Vital Signs BP (!) 144/86 (BP Location: Left Arm)   Pulse 80   Temp 98 F (36.7 C)   Resp 15   SpO2 96%   Physical Exam  Constitutional: She is oriented to person, place, and time. She appears well-developed and well-nourished. No distress.  HENT:  Head: Normocephalic and atraumatic.  Mouth/Throat: Oropharynx is clear and moist.  Eyes: Pupils are equal, round, and reactive to light. Conjunctivae and EOM are normal.  Neck: Normal range of motion. Neck supple.  Cardiovascular: Normal rate, regular rhythm and intact distal pulses.  No murmur heard. Pulmonary/Chest: Effort normal and breath sounds normal. No respiratory distress. She has no wheezes. She has no rales.  Musculoskeletal: She exhibits tenderness. She exhibits no edema.       Left shoulder: She exhibits decreased range of motion. She exhibits no tenderness, no bony tenderness, no swelling, no deformity, no laceration, no pain, no spasm and normal pulse.       Arms: Neurological: She is alert and oriented  to person, place, and time.  Gait is normal  Skin: Skin is warm and dry. No rash noted. No erythema.  Psychiatric: She has a normal mood and affect. Her behavior  is normal.  Nursing note and vitals reviewed.    ED Treatments / Results  Labs (all labs ordered are listed, but only abnormal results are displayed) Labs Reviewed - No data to display  EKG None  Radiology Ct Shoulder Left Wo Contrast  Result Date: 01/27/2018 CLINICAL DATA:  Patient reports falling and hitting her left collarbone this morning. Shoulder pain. EXAM: CT OF THE UPPER LEFT EXTREMITY WITHOUT CONTRAST TECHNIQUE: Multidetector CT imaging of the upper left extremity was performed according to the standard protocol. COMPARISON:  01/27/2018 FINDINGS: Bones/Joint/Cartilage Advanced osteoarthritic changes are noted about the glenohumeral joint with subchondral cystic change and/or erosions of the humeral head identified. Osteoarthritis of the acromioclavicular joint with joint space narrowing and subchondral cystic change is also identified though to a lesser degree. Age-indeterminate lucency involving the coracoid is identified. Given slightly sclerotic appearance surrounding the lucency and lack of significant soft tissue swelling adjacent to this finding, suspect that this may be more remote, series 4/29, series 9/36. No joint dislocation. The visualized portions of the scapula and included ribs appear intact. The glenoid appears intact. Ligaments Suboptimally assessed by CT. Muscles and Tendons Tendinosis of the included rotator cuff, more so of the supraspinatus is suspected with thickened appearance. Trace subacromial edema is noted. What appears to represent the biceps tendon appears to be seated within the biceps groove. Soft tissues Adjacent included lung is unremarkable without evidence of pulmonary contusion or pneumothorax. Aortic atherosclerosis of the included thoracic aorta is noted without definite aneurysm. No abnormal soft tissue mass or mineralization. IMPRESSION: 1. Subchondral cysts and/or erosions of the humeral head and about the [facility] joint consistent with osteoarthritis. 2.  No acute fracture or joint dislocation. 3. Thickened appearance of the supraspinatus compatible with tendinosis with subacromial bursitis. Further evaluation for tears are limited given lack of intra-articular contrast. 4. Subtle lucency of the anterior coracoid with sclerotic appearing margins may represent a remote nondisplaced fracture, vascular channel or developmental variant. Electronically Signed   By: Ashley Royalty M.D.   On: 01/27/2018 13:59   Dg Shoulder Left  Result Date: 01/27/2018 CLINICAL DATA:  Left shoulder pain after fall today. EXAM: LEFT SHOULDER - 2+ VIEW COMPARISON:  None. FINDINGS: There is no evidence of fracture or dislocation. Moderate degenerative changes seen involving the left acromioclavicular joint. Glenohumeral joint is unremarkable. Soft tissues are unremarkable. IMPRESSION: Moderate degenerative joint disease of the left acromioclavicular joint. No acute abnormality seen in the left shoulder. Electronically Signed   By: Marijo Conception, M.D.   On: 01/27/2018 12:10   Dg C-arm 1-60 Min-no Report  Result Date: 01/26/2018 Fluoroscopy was utilized by the requesting physician.  No radiographic interpretation.    Procedures Procedures (including critical care time)  Medications Ordered in ED Medications - No data to display   Initial Impression / Assessment and Plan / ED Course  I have reviewed the triage vital signs and the nursing notes.  Pertinent labs & imaging results that were available during my care of the patient were reviewed by me and considered in my medical decision making (see chart for details).    Pt with mechanical fall this morning.  Pt having pain over the left clavicle.  Pulses intact.  Normal sensation and movement of the shoulder.  Low suspicion for left shoulder injury.  No point tenderness over the ribs.  Patient is in no acute distress at this time.  Significant pain in the clavicle area with range of motion of the shoulder.  Patient does not  take anticoagulation and had no head injury or loss of consciousness.  Low suspicion for head or neck injury today.  Clavicle imaging is pending  1:19 PM Initial imaging is within normal limits however moderate degenerative disease is noted.  Given patient's significant pain we will do a CT to rule out occult fracture.  2:46 PM CT negative for acute bony pathology but does show arthritis and tendinitis and possible ligamentous injury.  Findings discussed with the patient and her daughter.  She was given a sling to use for comfort but encouraged to range her shoulder regularly.  She was also given a prescription for Voltaren gel and she will take Tylenol or tramadol which she Artie has at home.   Final Clinical Impressions(s) / ED Diagnoses   Final diagnoses:  Injury of left shoulder, initial encounter    ED Discharge Orders         Ordered    diclofenac sodium (VOLTAREN) 1 % GEL  4 times daily     01/27/18 1447           Blanchie Dessert, MD 01/27/18 1447

## 2018-01-27 NOTE — Anesthesia Postprocedure Evaluation (Signed)
Anesthesia Post Note  Patient: Darlene [name]  Procedure(s) Performed: TRANSURETHRAL RESECTION OF BLADDER TUMOR WITH GEMCITABIN BLLATERAL RETROGRADE PYELOGRAM (Bilateral )     Patient location during evaluation: PACU Anesthesia Type: General Level of consciousness: awake and alert, awake and oriented Pain management: pain level controlled Vital Signs Assessment: post-procedure vital signs reviewed and stable Respiratory status: spontaneous breathing, nonlabored ventilation and respiratory function stable Cardiovascular status: blood pressure returned to baseline and stable Postop Assessment: no apparent nausea or vomiting Anesthetic complications: no    Last Vitals:  Vitals:   01/26/18 1450 01/26/18 1530  BP: 130/64 (P) 118/87  Pulse: 74 (P) 76  Resp: 16 (P) 16  Temp: 36.9 C   SpO2: 94% (P) 94%    Last Pain:  Vitals:   01/26/18 1530  TempSrc:   PainSc: (P) 0-No pain                 Catalina Gravel

## 2018-01-27 NOTE — ED Notes (Signed)
Patient assisted to restroom using wheelchair.

## 2018-01-27 NOTE — ED Notes (Signed)
Patient provided with ice pack

## 2018-02-02 DIAGNOSIS — Z9181 History of falling: Secondary | ICD-10-CM | POA: Diagnosis not present

## 2018-02-02 DIAGNOSIS — I10 Essential (primary) hypertension: Secondary | ICD-10-CM | POA: Diagnosis not present

## 2018-02-02 DIAGNOSIS — M898X1 Other specified disorders of bone, shoulder: Secondary | ICD-10-CM | POA: Diagnosis not present

## 2018-02-02 DIAGNOSIS — J449 Chronic obstructive pulmonary disease, unspecified: Secondary | ICD-10-CM | POA: Diagnosis not present

## 2018-02-02 DIAGNOSIS — Z23 Encounter for immunization: Secondary | ICD-10-CM | POA: Diagnosis not present

## 2018-02-07 DIAGNOSIS — D696 Thrombocytopenia, unspecified: Secondary | ICD-10-CM | POA: Diagnosis not present

## 2018-02-07 DIAGNOSIS — M25512 Pain in left shoulder: Secondary | ICD-10-CM | POA: Diagnosis not present

## 2018-02-08 ENCOUNTER — Encounter: Payer: Self-pay | Admitting: Hematology and Oncology

## 2018-02-08 ENCOUNTER — Inpatient Hospital Stay (HOSPITAL_BASED_OUTPATIENT_CLINIC_OR_DEPARTMENT_OTHER): Payer: Medicare Other | Admitting: Hematology and Oncology

## 2018-02-08 ENCOUNTER — Telehealth: Payer: Self-pay | Admitting: Hematology and Oncology

## 2018-02-08 ENCOUNTER — Inpatient Hospital Stay: Payer: Medicare Other | Attending: Hematology and Oncology

## 2018-02-08 DIAGNOSIS — Z8551 Personal history of malignant neoplasm of bladder: Secondary | ICD-10-CM | POA: Insufficient documentation

## 2018-02-08 DIAGNOSIS — Z9181 History of falling: Secondary | ICD-10-CM

## 2018-02-08 DIAGNOSIS — Z7982 Long term (current) use of aspirin: Secondary | ICD-10-CM

## 2018-02-08 DIAGNOSIS — Z853 Personal history of malignant neoplasm of breast: Secondary | ICD-10-CM | POA: Diagnosis not present

## 2018-02-08 DIAGNOSIS — D693 Immune thrombocytopenic purpura: Secondary | ICD-10-CM

## 2018-02-08 DIAGNOSIS — M25512 Pain in left shoulder: Secondary | ICD-10-CM | POA: Insufficient documentation

## 2018-02-08 LAB — CBC WITH DIFFERENTIAL/PLATELET
ABS IMMATURE GRANULOCYTES: 0.04 10*3/uL (ref 0.00–0.07)
BASOS ABS: 0 10*3/uL (ref 0.0–0.1)
BASOS PCT: 1 %
Eosinophils Absolute: 0.1 10*3/uL (ref 0.0–0.5)
Eosinophils Relative: 2 %
HCT: 33.1 % — ABNORMAL LOW (ref 36.0–46.0)
Hemoglobin: 10.5 g/dL — ABNORMAL LOW (ref 12.0–15.0)
IMMATURE GRANULOCYTES: 1 %
Lymphocytes Relative: 21 %
Lymphs Abs: 1.3 10*3/uL (ref 0.7–4.0)
MCH: 27.4 pg (ref 26.0–34.0)
MCHC: 31.7 g/dL (ref 30.0–36.0)
MCV: 86.4 fL (ref 80.0–100.0)
Monocytes Absolute: 0.4 10*3/uL (ref 0.1–1.0)
Monocytes Relative: 7 %
NEUTROS PCT: 68 %
Neutro Abs: 4.2 10*3/uL (ref 1.7–7.7)
Platelets: 82 10*3/uL — ABNORMAL LOW (ref 150–400)
RBC: 3.83 MIL/uL — AB (ref 3.87–5.11)
RDW: 13.4 % (ref 11.5–15.5)
WBC: 6.2 10*3/uL (ref 4.0–10.5)
nRBC: 0 % (ref 0.0–0.2)

## 2018-02-08 NOTE — Assessment & Plan Note (Signed)
She is treated surgically by recent urologist I will defer to them for further management She denies recent hematuria

## 2018-02-08 NOTE — Progress Notes (Signed)
[facility] OFFICE PROGRESS NOTE  Patient Care Team: Holland Commons, FNP as PCP - General (Internal Medicine)  ASSESSMENT & PLAN:  Chronic ITP (idiopathic thrombocytopenia) (HCC) Her platelet count is stable  She is not symptomatic I plan to recheck again in 3 months The patient is educated to watch out for signs and symptoms of bleeding or excessive bruising She will continue aspirin therapy as long as her platelet count is above 50,000  Left shoulder pain She has recent left shoulder pain She is being referred to orthopedic surgeon for evaluation There is no contraindication for her to proceed  History of breast cancer in female She has excessive bruising due to recent fall I recommend waiting a month before proceed with screening mammogram.  History of bladder cancer She is treated surgically by recent urologist I will defer to them for further management She denies recent hematuria   No orders of the defined types were placed in this encounter.   INTERVAL HISTORY: Please see below for problem oriented charting. She is recovering well from recent bladder surgery She complained of left shoulder pain and had CT imaging done.  She is being referred to see orthopedic surgery She fell at home recently and bruised her chest wall extensively She denies syncopal episode, dizziness or chest pain prior to the fall The patient denies any recent signs or symptoms of bleeding such as spontaneous epistaxis, hematuria or hematochezia. She denies any recent abnormal breast examination, palpable mass, abnormal breast appearance or nipple changes No new lymphadenopathy  SUMMARY OF ONCOLOGIC HISTORY:  Darlene [name] was transferred to my care after her prior physician has left.  I reviewed the patient's records extensive and collaborated the history with the patient. Summary of her history is as follows: This patient was diagnosed with a remote history of limited  stage low grade B-cell non-Hodgkin's lymphoma, presenting with left supraclavicular lymphadenopathy in April 1999. She never required any systemic treatment. 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 This has also resolved on its own. She has never developed any additional adenopathy other than the single lymph node palpable in the left supraclavicular fossa.  She did develop a second primary stage I,  0.8 cm,  ER negative cancer of the right breast, treated with lumpectomy, radiation and six cycles of CMF chemotherapy in 1994. No evidence of a new disease from the breast cancer either. Most recent mammogram done in June 2015 showed stable fibroglandular changes in her breasts. She was called back for an ultrasound of the left breast which also came back benign. She had a CT scan of the abdomen done on 11/14/15 because of significant intentional weight loss and abdominal discomfort.  Imaging studies show no evidence of lymphoma.  Moderate stool burden was noted. Repeat CT scan in March 2019 show stable lymphadenopathy with no signs or symptoms of cancer recurrence On 01/26/2018, she underwent transurethral bladder resection of the tumor by urologist  REVIEW OF SYSTEMS:   Constitutional: Denies fevers, chills or abnormal weight loss Eyes: Denies blurriness of vision Ears, nose, mouth, throat, and face: Denies mucositis or sore throat Respiratory: Denies cough, dyspnea or wheezes Cardiovascular: Denies palpitation, chest discomfort or lower extremity swelling Gastrointestinal:  Denies nausea, heartburn or change in bowel habits Skin: Denies abnormal skin rashes Lymphatics: Denies new lymphadenopathy  Neurological:Denies numbness, tingling or new weaknesses Behavioral/Psych: Mood is stable, no new changes  All other systems were reviewed with the patient  and are negative.  I have reviewed the past medical history, past surgical history, social  history and family history with the patient and they are unchanged from previous note.  ALLERGIES:  is allergic to oxycodone; codeine; doxycycline; meperidine and related; sulfa antibiotics; and sulfacetamide sodium.  MEDICATIONS:  Current Outpatient Medications  Medication Sig Dispense Refill  . acetaminophen (TYLENOL) 500 MG tablet Take 1 tablet (500 mg total) by mouth every 6 (six) hours as needed for mild pain. (Patient taking differently: Take 1,000 mg by mouth every 8 (eight) hours as needed for mild pain. ) 30 tablet 0  . amoxicillin (AMOXIL) 500 MG capsule Take 2,000 mg by mouth once as needed (One hour prior to dental appointment).     Marland Kitchen aspirin 81 MG tablet Take 81 mg by mouth daily.    Marland Kitchen augmented betamethasone dipropionate (DIPROLENE-AF) 0.05 % ointment Apply 1 application topically at bedtime as needed (itching).     Marland Kitchen buPROPion (WELLBUTRIN XL) 150 MG 24 hr tablet Take 150 mg by mouth daily.    . Calcium Carbonate-Vit D-Min (CALTRATE 600+D PLUS MINERALS) 600-800 MG-UNIT CHEW Chew 2 each by mouth 2 (two) times daily.    . cholecalciferol (VITAMIN D) 1000 UNITS tablet Take 1,000 Units by mouth daily.    . citalopram (CELEXA) 40 MG tablet Take 40 mg by mouth daily.    . Cyanocobalamin (VITAMIN B-12 PO) Take 1,000 mcg by mouth daily.    . diclofenac sodium (VOLTAREN) 1 % GEL Apply 2 g topically 4 (four) times daily. 100 g 0  . esomeprazole (NEXIUM) 40 MG capsule Take 40 mg by mouth 2 (two) times daily before a meal.     . famciclovir (FAMVIR) 125 MG tablet Take 250 mg by mouth 2 (two) times daily as needed (outbreak). For 5 days as needed for outbreak     . L-Methylfolate-Algae-B12-B6 (METANX) 3-90.314-2-35 MG CAPS Take 1 tablet by mouth 2 (two) times daily.    Marland Kitchen levothyroxine (SYNTHROID, LEVOTHROID) 75 MCG tablet Take 75 mcg by mouth daily before breakfast.     . lovastatin (MEVACOR) 20 MG tablet Take 20 mg by mouth at bedtime.      Marland Kitchen LYRICA 50 MG capsule Take 50 mg by mouth 2 (two)  times daily.     . magnesium oxide (MAG-OX) 400 MG tablet Take 400 mg by mouth daily.    . metoprolol succinate (TOPROL-XL) 25 MG 24 hr tablet Take 25 mg by mouth daily.     . Multiple Vitamins-Minerals (MULTIVITAMINS THER. W/MINERALS) TABS Take 1 tablet by mouth daily.      Marland Kitchen nystatin-triamcinolone ointment (MYCOLOG) Apply 1 application topically 2 (two) times daily as needed (itching).     . phenazopyridine (PYRIDIUM) 200 MG tablet Take 1 tablet (200 mg total) by mouth 3 (three) times daily as needed for pain. 10 tablet 0  . Polyethyl Glycol-Propyl Glycol (SYSTANE) 0.4-0.3 % SOLN Apply 2 drops to eye 2 (two) times daily as needed (for dry eyes).    . traMADol (ULTRAM) 50 MG tablet Take 1-2 tablets (50-100 mg total) by mouth every 6 (six) hours as needed for moderate pain. 5 tablet 0  . Vitamin D, Ergocalciferol, (DRISDOL) 50000 UNITS CAPS Take 50,000 Units by mouth every 14 (fourteen) days.       No current facility-administered medications for this visit.     PHYSICAL EXAMINATION: ECOG PERFORMANCE STATUS: 1 - Symptomatic but completely ambulatory  Vitals:   02/08/18 1215  BP: (!) 155/83  Pulse:  75  Resp: 18  Temp: 97.9 F (36.6 C)  SpO2: 97%   Filed Weights   02/08/18 1215  Weight: 174 lb 3.2 oz (79 kg)    GENERAL:alert, no distress and comfortable SKIN: Noted extensive bruises on her chest wall Musculoskeletal:no cyanosis of digits and no clubbing  NEURO: alert & oriented x 3 with fluent speech, no focal motor/sensory deficits  LABORATORY DATA:  I have reviewed the data as listed    Component Value Date/Time   NA 141 01/18/2018 1219   NA 135 (L) 07/07/2016 1035   K 4.5 01/18/2018 1219   K 4.8 07/07/2016 1035   CL 106 01/18/2018 1219   CL 106 07/11/2012 0906   CO2 26 01/18/2018 1219   CO2 24 07/07/2016 1035   GLUCOSE 100 (H) 01/18/2018 1219   GLUCOSE 85 07/07/2016 1035   GLUCOSE 107 (H) 07/11/2012 0906   BUN 17 01/18/2018 1219   BUN 19.9 07/07/2016 1035    CREATININE 0.64 01/18/2018 1219   CREATININE 0.7 07/07/2016 1035   CALCIUM 10.5 (H) 01/18/2018 1219   CALCIUM 10.2 07/07/2016 1035   PROT 6.9 07/08/2017 1053   PROT 6.6 07/07/2016 1035   ALBUMIN 3.9 07/08/2017 1053   ALBUMIN 3.9 07/07/2016 1035   AST 14 07/08/2017 1053   AST 14 07/07/2016 1035   ALT 19 07/08/2017 1053   ALT 17 07/07/2016 1035   ALKPHOS 60 07/08/2017 1053   ALKPHOS 58 07/07/2016 1035   BILITOT 0.7 07/08/2017 1053   BILITOT 0.61 07/07/2016 1035   GFRNONAA >60 01/18/2018 1219   GFRAA >60 01/18/2018 1219    No results found for: SPEP, UPEP  Lab Results  Component Value Date   WBC 6.2 02/08/2018   NEUTROABS 4.2 02/08/2018   HGB 10.5 (L) 02/08/2018   HCT 33.1 (L) 02/08/2018   MCV 86.4 02/08/2018   PLT 82 (L) 02/08/2018      Chemistry      Component Value Date/Time   NA 141 01/18/2018 1219   NA 135 (L) 07/07/2016 1035   K 4.5 01/18/2018 1219   K 4.8 07/07/2016 1035   CL 106 01/18/2018 1219   CL 106 07/11/2012 0906   CO2 26 01/18/2018 1219   CO2 24 07/07/2016 1035   BUN 17 01/18/2018 1219   BUN 19.9 07/07/2016 1035   CREATININE 0.64 01/18/2018 1219   CREATININE 0.7 07/07/2016 1035      Component Value Date/Time   CALCIUM 10.5 (H) 01/18/2018 1219   CALCIUM 10.2 07/07/2016 1035   ALKPHOS 60 07/08/2017 1053   ALKPHOS 58 07/07/2016 1035   AST 14 07/08/2017 1053   AST 14 07/07/2016 1035   ALT 19 07/08/2017 1053   ALT 17 07/07/2016 1035   BILITOT 0.7 07/08/2017 1053   BILITOT 0.61 07/07/2016 1035       RADIOGRAPHIC STUDIES: I have personally reviewed the radiological images as listed and agreed with the findings in the report. Ct Shoulder Left Wo Contrast  Result Date: 01/27/2018 CLINICAL DATA:  Patient reports falling and hitting her left collarbone this morning. Shoulder pain. EXAM: CT OF THE UPPER LEFT EXTREMITY WITHOUT CONTRAST TECHNIQUE: Multidetector CT imaging of the upper left extremity was performed according to the standard protocol.  COMPARISON:  01/27/2018 FINDINGS: Bones/Joint/Cartilage Advanced osteoarthritic changes are noted about the glenohumeral joint with subchondral cystic change and/or erosions of the humeral head identified. Osteoarthritis of the acromioclavicular joint with joint space narrowing and subchondral cystic change is also identified though to a lesser degree.  Age-indeterminate lucency involving the coracoid is identified. Given slightly sclerotic appearance surrounding the lucency and lack of significant soft tissue swelling adjacent to this finding, suspect that this may be more remote, series 4/29, series 9/36. No joint dislocation. The visualized portions of the scapula and included ribs appear intact. The glenoid appears intact. Ligaments Suboptimally assessed by CT. Muscles and Tendons Tendinosis of the included rotator cuff, more so of the supraspinatus is suspected with thickened appearance. Trace subacromial edema is noted. What appears to represent the biceps tendon appears to be seated within the biceps groove. Soft tissues Adjacent included lung is unremarkable without evidence of pulmonary contusion or pneumothorax. Aortic atherosclerosis of the included thoracic aorta is noted without definite aneurysm. No abnormal soft tissue mass or mineralization. IMPRESSION: 1. Subchondral cysts and/or erosions of the humeral head and about the [facility] joint consistent with osteoarthritis. 2. No acute fracture or joint dislocation. 3. Thickened appearance of the supraspinatus compatible with tendinosis with subacromial bursitis. Further evaluation for tears are limited given lack of intra-articular contrast. 4. Subtle lucency of the anterior coracoid with sclerotic appearing margins may represent a remote nondisplaced fracture, vascular channel or developmental variant. Electronically Signed   By: Ashley Royalty M.D.   On: 01/27/2018 13:59   Dg Shoulder Left  Result Date: 01/27/2018 CLINICAL DATA:  Left shoulder pain after  fall today. EXAM: LEFT SHOULDER - 2+ VIEW COMPARISON:  None. FINDINGS: There is no evidence of fracture or dislocation. Moderate degenerative changes seen involving the left acromioclavicular joint. Glenohumeral joint is unremarkable. Soft tissues are unremarkable. IMPRESSION: Moderate degenerative joint disease of the left acromioclavicular joint. No acute abnormality seen in the left shoulder. Electronically Signed   By: Marijo Conception, M.D.   On: 01/27/2018 12:10   Dg C-arm 1-60 Min-no Report  Result Date: 01/26/2018 Fluoroscopy was utilized by the requesting physician.  No radiographic interpretation.    All questions were answered. The patient knows to call the clinic with any problems, questions or concerns. No barriers to learning was detected.  I spent 15 minutes counseling the patient face to face. The total time spent in the appointment was 20 minutes and more than 50% was on counseling and review of test results  Heath Lark, MD 02/08/2018 2:23 PM

## 2018-02-08 NOTE — Assessment & Plan Note (Signed)
Her platelet count is stable  She is not symptomatic I plan to recheck again in 3 months The patient is educated to watch out for signs and symptoms of bleeding or excessive bruising She will continue aspirin therapy as long as her platelet count is above 50,000

## 2018-02-08 NOTE — Assessment & Plan Note (Signed)
She has recent left shoulder pain She is being referred to orthopedic surgeon for evaluation There is no contraindication for her to proceed

## 2018-02-08 NOTE — Telephone Encounter (Signed)
Gave patient avs and calendar.   °

## 2018-02-08 NOTE — Assessment & Plan Note (Signed)
She has excessive bruising due to recent fall I recommend waiting a month before proceed with screening mammogram.

## 2018-02-11 DIAGNOSIS — C670 Malignant neoplasm of trigone of bladder: Secondary | ICD-10-CM | POA: Diagnosis not present

## 2018-02-16 DIAGNOSIS — S40012A Contusion of left shoulder, initial encounter: Secondary | ICD-10-CM | POA: Diagnosis not present

## 2018-03-10 DIAGNOSIS — H2511 Age-related nuclear cataract, right eye: Secondary | ICD-10-CM | POA: Diagnosis not present

## 2018-03-10 DIAGNOSIS — H59211 Accidental puncture and laceration of right eye and adnexa during an ophthalmic procedure: Secondary | ICD-10-CM | POA: Diagnosis not present

## 2018-03-10 DIAGNOSIS — H25811 Combined forms of age-related cataract, right eye: Secondary | ICD-10-CM | POA: Diagnosis not present

## 2018-03-11 DIAGNOSIS — H59021 Cataract (lens) fragments in eye following cataract surgery, right eye: Secondary | ICD-10-CM | POA: Diagnosis not present

## 2018-03-11 DIAGNOSIS — Z961 Presence of intraocular lens: Secondary | ICD-10-CM | POA: Diagnosis not present

## 2018-03-11 DIAGNOSIS — H5988 Other intraoperative complications of eye and adnexa, not elsewhere classified: Secondary | ICD-10-CM | POA: Diagnosis not present

## 2018-03-11 DIAGNOSIS — H4301 Vitreous prolapse, right eye: Secondary | ICD-10-CM | POA: Diagnosis not present

## 2018-03-11 DIAGNOSIS — H2512 Age-related nuclear cataract, left eye: Secondary | ICD-10-CM | POA: Diagnosis not present

## 2018-03-11 DIAGNOSIS — H43311 Vitreous membranes and strands, right eye: Secondary | ICD-10-CM | POA: Diagnosis not present

## 2018-04-13 DIAGNOSIS — S46912D Strain of unspecified muscle, fascia and tendon at shoulder and upper arm level, left arm, subsequent encounter: Secondary | ICD-10-CM | POA: Diagnosis not present

## 2018-04-13 DIAGNOSIS — S46912A Strain of unspecified muscle, fascia and tendon at shoulder and upper arm level, left arm, initial encounter: Secondary | ICD-10-CM | POA: Insufficient documentation

## 2018-04-19 DIAGNOSIS — M25511 Pain in right shoulder: Secondary | ICD-10-CM | POA: Diagnosis not present

## 2018-04-19 DIAGNOSIS — G8929 Other chronic pain: Secondary | ICD-10-CM | POA: Diagnosis not present

## 2018-04-22 ENCOUNTER — Encounter: Payer: Self-pay | Admitting: Podiatry

## 2018-04-22 ENCOUNTER — Ambulatory Visit (INDEPENDENT_AMBULATORY_CARE_PROVIDER_SITE_OTHER): Payer: Medicare Other | Admitting: Podiatry

## 2018-04-22 DIAGNOSIS — M79676 Pain in unspecified toe(s): Secondary | ICD-10-CM | POA: Diagnosis not present

## 2018-04-22 DIAGNOSIS — B351 Tinea unguium: Secondary | ICD-10-CM

## 2018-04-22 NOTE — Progress Notes (Signed)
Patient ID: QUINCEY NORED, female   DOB: January 20, 1935, 83 y.o.   MRN: [id number] Complaint:  Visit Type: Patient returns to my office for continued preventative foot care services. Complaint: Patient states" my nails have grown long and thick and become painful to walk and wear shoes".  He presents for preventative foot care services. No changes to ROS  Podiatric Exam: Vascular: dorsalis pedis and posterior tibial pulses are palpable bilateral. Capillary return is immediate. Temperature gradient is WNL. Skin turgor WNL  Sensorium: Normal Semmes Weinstein monofilament test. Normal tactile sensation bilaterally. Nail Exam: Pt has thick disfigured discolored nails with subungual debris noted bilateral entire nail hallux through fifth toenails Ulcer Exam: There is no evidence of ulcer or pre-ulcerative changes or infection. Orthopedic Exam: Muscle tone and strength are WNL. No limitations in general ROM. No crepitus or effusions noted. Hallux varus 1st MPJ right with hammer toes2,3 right. Bony prominences are unremarkable. Asymptomatic adducto varus fifth digits. Skin: No Porokeratosis. No infection or ulcers.  Diagnosis:  Tinea unguium, Pain in right toe, pain in left toes  Treatment & Plan Procedures and Treatment: Consent by patient was obtained for treatment procedures. The patient understood the discussion of treatment and procedures well. All questions were answered thoroughly reviewed. Debridement of mycotic and hypertrophic toenails, 1 through 5 bilateral and clearing of subungual debris. No ulceration, no infection noted.  Return Visit-Office Procedure: Patient instructed to return to the office for a follow up visit 10 weeks  for continued evaluation and treatment.

## 2018-05-09 DIAGNOSIS — C670 Malignant neoplasm of trigone of bladder: Secondary | ICD-10-CM | POA: Diagnosis not present

## 2018-05-16 DIAGNOSIS — H6123 Impacted cerumen, bilateral: Secondary | ICD-10-CM | POA: Diagnosis not present

## 2018-05-16 DIAGNOSIS — H608X2 Other otitis externa, left ear: Secondary | ICD-10-CM | POA: Diagnosis not present

## 2018-05-17 ENCOUNTER — Telehealth: Payer: Self-pay | Admitting: Hematology and Oncology

## 2018-05-17 ENCOUNTER — Encounter: Payer: Self-pay | Admitting: Hematology and Oncology

## 2018-05-17 ENCOUNTER — Inpatient Hospital Stay: Payer: Medicare Other | Attending: Hematology and Oncology

## 2018-05-17 ENCOUNTER — Inpatient Hospital Stay (HOSPITAL_BASED_OUTPATIENT_CLINIC_OR_DEPARTMENT_OTHER): Payer: Medicare Other | Admitting: Hematology and Oncology

## 2018-05-17 DIAGNOSIS — Z853 Personal history of malignant neoplasm of breast: Secondary | ICD-10-CM

## 2018-05-17 DIAGNOSIS — Z8551 Personal history of malignant neoplasm of bladder: Secondary | ICD-10-CM | POA: Diagnosis not present

## 2018-05-17 DIAGNOSIS — D693 Immune thrombocytopenic purpura: Secondary | ICD-10-CM

## 2018-05-17 DIAGNOSIS — Z923 Personal history of irradiation: Secondary | ICD-10-CM | POA: Insufficient documentation

## 2018-05-17 DIAGNOSIS — Z9221 Personal history of antineoplastic chemotherapy: Secondary | ICD-10-CM

## 2018-05-17 DIAGNOSIS — Z79899 Other long term (current) drug therapy: Secondary | ICD-10-CM | POA: Insufficient documentation

## 2018-05-17 DIAGNOSIS — Z7982 Long term (current) use of aspirin: Secondary | ICD-10-CM | POA: Diagnosis not present

## 2018-05-17 DIAGNOSIS — D649 Anemia, unspecified: Secondary | ICD-10-CM | POA: Diagnosis not present

## 2018-05-17 DIAGNOSIS — D638 Anemia in other chronic diseases classified elsewhere: Secondary | ICD-10-CM

## 2018-05-17 LAB — CBC WITH DIFFERENTIAL/PLATELET
Abs Immature Granulocytes: 0.03 10*3/uL (ref 0.00–0.07)
Basophils Absolute: 0.1 10*3/uL (ref 0.0–0.1)
Basophils Relative: 1 %
Eosinophils Absolute: 0.1 10*3/uL (ref 0.0–0.5)
Eosinophils Relative: 2 %
HCT: 33.5 % — ABNORMAL LOW (ref 36.0–46.0)
Hemoglobin: 10.8 g/dL — ABNORMAL LOW (ref 12.0–15.0)
Immature Granulocytes: 0 %
Lymphocytes Relative: 19 %
Lymphs Abs: 1.4 10*3/uL (ref 0.7–4.0)
MCH: 27.6 pg (ref 26.0–34.0)
MCHC: 32.2 g/dL (ref 30.0–36.0)
MCV: 85.7 fL (ref 80.0–100.0)
Monocytes Absolute: 0.5 10*3/uL (ref 0.1–1.0)
Monocytes Relative: 7 %
Neutro Abs: 5 10*3/uL (ref 1.7–7.7)
Neutrophils Relative %: 71 %
Platelets: 133 10*3/uL — ABNORMAL LOW (ref 150–400)
RBC: 3.91 MIL/uL (ref 3.87–5.11)
RDW: 14.2 % (ref 11.5–15.5)
WBC: 7.1 10*3/uL (ref 4.0–10.5)
nRBC: 0 % (ref 0.0–0.2)

## 2018-05-17 NOTE — Progress Notes (Signed)
[facility] OFFICE PROGRESS NOTE  Patient Care Team: Holland Commons, FNP as PCP - General (Internal Medicine)  ASSESSMENT & PLAN:  Chronic ITP (idiopathic thrombocytopenia) (HCC) Her platelet count is stable  She is not symptomatic I plan to recheck again in 6 months The patient is educated to watch out for signs and symptoms of bleeding or excessive bruising She will continue aspirin therapy as long as her platelet count is above 50,000  History of bladder cancer She has history of bladder cancer and complained of some mild spotting in her underwear I recommend for her to talk to her gynecologist or urologist for further management   Anemia, chronic disease She has mild anemia chronic disease, overall stable She is not symptomatic Observe only.   No orders of the defined types were placed in this encounter.   INTERVAL HISTORY: Please see below for problem oriented charting. She returns for further follow-up She complained of intermittent spotting in her underwear The patient denies any recent signs or symptoms of bleeding such as spontaneous epistaxis, hematuria or hematochezia. She denies recent infection.  She reminded me she had bladder resection several months ago. No new lymphadenopathy. She denies dysuria or urinary frequency  SUMMARY OF ONCOLOGIC HISTORY:  Darlene CIBRIAN was transferred to my care after her prior physician has left.  I reviewed the patient's records extensive and collaborated the history with the patient. 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 She never required any systemic treatment. 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 This has also resolved on its own. She has never developed any additional  adenopathy other than the single lymph node palpable in the left supraclavicular fossa.  She did develop a second primary stage I,  0.8 cm,  ER negative cancer of the right breast, treated with lumpectomy, radiation and six cycles of CMF chemotherapy in 1994. No evidence of a new disease from the breast cancer either. Most recent mammogram done in June 2015 showed stable fibroglandular changes in her breasts. She was called back for an ultrasound of the left breast which also came back benign. She had a CT scan of the abdomen done on 11/14/15 because of significant intentional weight loss and abdominal discomfort.  Imaging studies show no evidence of lymphoma.  Moderate stool burden was noted. Repeat CT scan in March 2019 show stable lymphadenopathy with no signs or symptoms of cancer recurrence On 01/26/2018, she underwent transurethral bladder resection of the tumor by urologist  REVIEW OF SYSTEMS:   Constitutional: Denies fevers, chills or abnormal weight loss Eyes: Denies blurriness of vision Ears, nose, mouth, throat, and face: Denies mucositis or sore throat Respiratory: Denies cough, dyspnea or wheezes Cardiovascular: Denies palpitation, chest discomfort or lower extremity swelling Gastrointestinal:  Denies nausea, heartburn or change in bowel habits Skin: Denies abnormal skin rashes Lymphatics: Denies new lymphadenopathy or easy bruising Neurological:Denies numbness, tingling or new weaknesses Behavioral/Psych: Mood is stable, no new changes  All other systems were reviewed with the patient and are negative.  I have reviewed the past medical history, past surgical history, social history and family history with the patient and they are unchanged from previous note.  ALLERGIES:  is allergic to meperidine; oxycodone; codeine; doxycycline; meperidine and related; sulfa antibiotics; and sulfacetamide sodium.  MEDICATIONS:  Current Outpatient Medications  Medication Sig Dispense Refill  .  acetaminophen (TYLENOL) 500 MG tablet Take 1 tablet (500 mg total) by mouth every 6 (six) hours as needed for mild pain. (Patient taking differently: Take 1,000 mg by mouth every 8 (eight) hours as needed for mild pain. ) 30 tablet 0  . amoxicillin (AMOXIL) 500 MG capsule Take 2,000 mg by mouth once as needed (One hour prior to dental appointment).     Marland Kitchen aspirin 81 MG tablet Take 81 mg by mouth daily.    Marland Kitchen augmented betamethasone dipropionate (DIPROLENE-AF) 0.05 % ointment betamethasone, augmented 0.05 % topical ointment    . buPROPion (WELLBUTRIN XL) 150 MG 24 hr tablet bupropion HCl XL 150 mg 24 hr tablet, extended release    . Calcium Carbonate-Vit D-Min (CALTRATE 600+D PLUS MINERALS) 600-800 MG-UNIT CHEW Chew 2 each by mouth 2 (two) times daily.    . cholecalciferol (VITAMIN D) 1000 UNITS tablet Take 1,000 Units by mouth daily.    . citalopram (CELEXA) 40 MG tablet citalopram 40 mg tablet    . Clobetasol Propionate 0.05 % shampoo clobetasol 0.05 % shampoo    . Cyanocobalamin (VITAMIN B-12 PO) Take 1,000 mcg by mouth daily.    . diclofenac sodium (VOLTAREN) 1 % GEL diclofenac 1 % topical gel    . Difluprednate (DUREZOL) 0.05 % EMUL Durezol 0.05 % eye drops    . esomeprazole (NEXIUM) 40 MG capsule Take 40 mg by mouth 2 (two) times daily before a meal.     . famciclovir (FAMVIR) 125 MG tablet famciclovir 125 mg tablet    . fluocinonide (LIDEX) 0.05 % external solution fluocinonide 0.05 % topical solution    . L-Methylfolate-Algae-B12-B6 (METANX) 3-90.314-2-35 MG CAPS Take 1 tablet by mouth 2 (two) times daily.    Marland Kitchen levothyroxine (SYNTHROID, LEVOTHROID) 75 MCG tablet levothyroxine 75 mcg tablet    . lovastatin (MEVACOR) 20 MG tablet lovastatin 20 mg tablet    . magnesium oxide (MAG-OX) 400 MG tablet Take 400 mg by mouth daily.    . metoprolol succinate (TOPROL-XL) 25 MG 24 hr tablet metoprolol succinate ER 25 mg tablet,extended release 24 hr    . Multiple Vitamins-Minerals (MULTIVITAMINS THER.  W/MINERALS) TABS Take 1 tablet by mouth daily.      Marland Kitchen nystatin-triamcinolone ointment (MYCOLOG) nystatin-triamcinolone 100,000 unit/gram-0.1 % topical ointment    . Polyethyl Glycol-Propyl Glycol (SYSTANE) 0.4-0.3 % SOLN Apply 2 drops to eye 2 (two) times daily as needed (for dry eyes).    . pregabalin (LYRICA) 50 MG capsule pregabalin 50 mg capsule    . traMADol (ULTRAM) 50 MG tablet tramadol 50 mg tablet    . UNABLE TO FIND amoxicillin 500 mg capsule    . UNABLE TO FIND esomeprazole magnesium 40 mg capsule,delayed release    . UNABLE TO FIND Metanx (algal oil) 3 mg-35 mg-2 mg-90.314 mg capsule    . UNABLE TO FIND moxifloxacin 0.5 % eye drops    . UNABLE TO FIND Vitamin D2 1,250 mcg (50,000 unit) capsule    . Vitamin D, Ergocalciferol, (DRISDOL) 50000 UNITS CAPS Take 50,000 Units by mouth every 14 (fourteen) days.       No current facility-administered medications for this visit.     PHYSICAL EXAMINATION: ECOG PERFORMANCE STATUS: 1 - Symptomatic but completely ambulatory  Vitals:   05/17/18 1058  BP: 124/69  Pulse: 79  Resp: 18  Temp: (!) 97.5 F (36.4 C)  SpO2: 97%   Filed Weights   05/17/18 1058  Weight: 170 lb 9.6  oz (77.4 kg)    GENERAL:alert, no distress and comfortable Musculoskeletal:no cyanosis of digits and no clubbing  NEURO: alert & oriented x 3 with fluent speech, no focal motor/sensory deficits  LABORATORY DATA:  I have reviewed the data as listed    Component Value Date/Time   NA 141 01/18/2018 1219   NA 135 (L) 07/07/2016 1035   K 4.5 01/18/2018 1219   K 4.8 07/07/2016 1035   CL 106 01/18/2018 1219   CL 106 07/11/2012 0906   CO2 26 01/18/2018 1219   CO2 24 07/07/2016 1035   GLUCOSE 100 (H) 01/18/2018 1219   GLUCOSE 85 07/07/2016 1035   GLUCOSE 107 (H) 07/11/2012 0906   BUN 17 01/18/2018 1219   BUN 19.9 07/07/2016 1035   CREATININE 0.64 01/18/2018 1219   CREATININE 0.7 07/07/2016 1035   CALCIUM 10.5 (H) 01/18/2018 1219   CALCIUM 10.2 07/07/2016  1035   PROT 6.9 07/08/2017 1053   PROT 6.6 07/07/2016 1035   ALBUMIN 3.9 07/08/2017 1053   ALBUMIN 3.9 07/07/2016 1035   AST 14 07/08/2017 1053   AST 14 07/07/2016 1035   ALT 19 07/08/2017 1053   ALT 17 07/07/2016 1035   ALKPHOS 60 07/08/2017 1053   ALKPHOS 58 07/07/2016 1035   BILITOT 0.7 07/08/2017 1053   BILITOT 0.61 07/07/2016 1035   GFRNONAA >60 01/18/2018 1219   GFRAA >60 01/18/2018 1219    No results found for: SPEP, UPEP  Lab Results  Component Value Date   WBC 7.1 05/17/2018   NEUTROABS 5.0 05/17/2018   HGB 10.8 (L) 05/17/2018   HCT 33.5 (L) 05/17/2018   MCV 85.7 05/17/2018   PLT 133 (L) 05/17/2018      Chemistry      Component Value Date/Time   NA 141 01/18/2018 1219   NA 135 (L) 07/07/2016 1035   K 4.5 01/18/2018 1219   K 4.8 07/07/2016 1035   CL 106 01/18/2018 1219   CL 106 07/11/2012 0906   CO2 26 01/18/2018 1219   CO2 24 07/07/2016 1035   BUN 17 01/18/2018 1219   BUN 19.9 07/07/2016 1035   CREATININE 0.64 01/18/2018 1219   CREATININE 0.7 07/07/2016 1035      Component Value Date/Time   CALCIUM 10.5 (H) 01/18/2018 1219   CALCIUM 10.2 07/07/2016 1035   ALKPHOS 60 07/08/2017 1053   ALKPHOS 58 07/07/2016 1035   AST 14 07/08/2017 1053   AST 14 07/07/2016 1035   ALT 19 07/08/2017 1053   ALT 17 07/07/2016 1035   BILITOT 0.7 07/08/2017 1053   BILITOT 0.61 07/07/2016 1035       All questions were answered. The patient knows to call the clinic with any problems, questions or concerns. No barriers to learning was detected.  I spent 10 minutes counseling the patient face to face. The total time spent in the appointment was 15 minutes and more than 50% was on counseling and review of test results  Heath Lark, MD 05/17/2018 11:17 AM

## 2018-05-17 NOTE — Assessment & Plan Note (Signed)
She has mild anemia chronic disease, overall stable She is not symptomatic Observe only. 

## 2018-05-17 NOTE — Telephone Encounter (Signed)
Gave avs and calendar ° °

## 2018-05-17 NOTE — Assessment & Plan Note (Signed)
Her platelet count is stable  She is not symptomatic I plan to recheck again in 6 months The patient is educated to watch out for signs and symptoms of bleeding or excessive bruising She will continue aspirin therapy as long as her platelet count is above 50,000

## 2018-05-17 NOTE — Assessment & Plan Note (Signed)
She has history of bladder cancer and complained of some mild spotting in her underwear I recommend for her to talk to her gynecologist or urologist for further management

## 2018-05-19 ENCOUNTER — Emergency Department (HOSPITAL_COMMUNITY): Payer: Medicare Other

## 2018-05-19 ENCOUNTER — Encounter (HOSPITAL_COMMUNITY): Payer: Self-pay

## 2018-05-19 ENCOUNTER — Other Ambulatory Visit: Payer: Self-pay

## 2018-05-19 ENCOUNTER — Emergency Department (HOSPITAL_COMMUNITY)
Admission: EM | Admit: 2018-05-19 | Discharge: 2018-05-19 | Disposition: A | Discharge status: Home / Self Care | Payer: Medicare Other | Attending: Emergency Medicine | Admitting: Emergency Medicine

## 2018-05-19 DIAGNOSIS — Z79899 Other long term (current) drug therapy: Secondary | ICD-10-CM | POA: Insufficient documentation

## 2018-05-19 DIAGNOSIS — I10 Essential (primary) hypertension: Secondary | ICD-10-CM | POA: Insufficient documentation

## 2018-05-19 DIAGNOSIS — R42 Dizziness and giddiness: Secondary | ICD-10-CM | POA: Diagnosis not present

## 2018-05-19 DIAGNOSIS — Z96651 Presence of right artificial knee joint: Secondary | ICD-10-CM | POA: Insufficient documentation

## 2018-05-19 DIAGNOSIS — J449 Chronic obstructive pulmonary disease, unspecified: Secondary | ICD-10-CM | POA: Insufficient documentation

## 2018-05-19 DIAGNOSIS — R531 Weakness: Secondary | ICD-10-CM | POA: Insufficient documentation

## 2018-05-19 DIAGNOSIS — Z7982 Long term (current) use of aspirin: Secondary | ICD-10-CM | POA: Insufficient documentation

## 2018-05-19 DIAGNOSIS — Z87891 Personal history of nicotine dependence: Secondary | ICD-10-CM | POA: Insufficient documentation

## 2018-05-19 DIAGNOSIS — E039 Hypothyroidism, unspecified: Secondary | ICD-10-CM | POA: Diagnosis not present

## 2018-05-19 LAB — URINALYSIS, ROUTINE W REFLEX MICROSCOPIC
Bacteria, UA: NONE SEEN
Bilirubin Urine: NEGATIVE
GLUCOSE, UA: NEGATIVE mg/dL
Ketones, ur: NEGATIVE mg/dL
Leukocytes, UA: NEGATIVE
Nitrite: NEGATIVE
Protein, ur: NEGATIVE mg/dL
Specific Gravity, Urine: 1.006 (ref 1.005–1.030)
pH: 7 (ref 5.0–8.0)

## 2018-05-19 LAB — CBC
HCT: 35.7 % — ABNORMAL LOW (ref 36.0–46.0)
Hemoglobin: 11 g/dL — ABNORMAL LOW (ref 12.0–15.0)
MCH: 27.4 pg (ref 26.0–34.0)
MCHC: 30.8 g/dL (ref 30.0–36.0)
MCV: 88.8 fL (ref 80.0–100.0)
NRBC: 0 % (ref 0.0–0.2)
Platelets: 150 10*3/uL (ref 150–400)
RBC: 4.02 MIL/uL (ref 3.87–5.11)
RDW: 14 % (ref 11.5–15.5)
WBC: 7.2 10*3/uL (ref 4.0–10.5)

## 2018-05-19 LAB — BASIC METABOLIC PANEL
Anion gap: 10 (ref 5–15)
BUN: 13 mg/dL (ref 8–23)
CO2: 21 mmol/L — AB (ref 22–32)
Calcium: 9.6 mg/dL (ref 8.9–10.3)
Chloride: 105 mmol/L (ref 98–111)
Creatinine, Ser: 0.62 mg/dL (ref 0.44–1.00)
GFR calc Af Amer: >60 mL/min (ref >60)
GFR calc non Af Amer: >60 mL/min (ref >60)
Glucose, Bld: 89 mg/dL (ref 70–99)
Potassium: 4 mmol/L (ref 3.5–5.1)
Sodium: 136 mmol/L (ref 135–145)

## 2018-05-19 MED ORDER — SODIUM CHLORIDE 0.9% FLUSH: 3.0000 mL | Freq: Once | INTRAVENOUS | Status: DC

## 2018-05-19 MED ORDER — PREGABALIN 50 MG PO CAPS
50.0000 mg | ORAL_CAPSULE | Freq: Once | ORAL | Status: AC
  Administered 2018-05-19: 50 mg via ORAL
  Filled 2018-05-19: qty 1

## 2018-05-19 MED ORDER — PREGABALIN 50 MG PO CAPS: 50.0000 mg | ORAL_CAPSULE | Freq: Two times a day (BID) | ORAL | 0 refills | Status: DC

## 2018-05-19 NOTE — ED Provider Notes (Signed)
[facility] Provider Note   CSN: [id number] Arrival date & time: 05/19/18  1430     History   Chief Complaint Chief Complaint  Patient presents with  . Weakness  . Dizziness    HPI Darlene [name] is a 84 y.o. female with a medical history significant for lymphoma in remission x 1 year, hypertension, ITP, breast cancer, bladder cancer,  COPD presenting to the emergency department today with chief complaint of dizzy and weakness. The onset is acute, starting 9 hours ago.  Patient reports she was at [employer] this morning when she started to notice she felt weak.  She stood up and felt dizzy so she sat back down. She describes her dizziness as feeling like her head is spinning. She thought it might be because she needed to eat so she ate 4 donuts and a muffin. The dizziness and weakness resolved spontaneously. She denies any pain. She did not take any medications for her symptoms prior to arrival.  Denies syncopal episodes, chest pain, palpitations, diaphoresis, fever, chills, shortness of breath, vomiting, visual changes.  Pt reports she is also out of her lyrica for neuropathy x 2 days ago. She has called her pcp's office and left a message asking for a refill but has not heard back.  History is provided by the patient. Past Medical History:  Diagnosis Date  . Anxiety   . Breast cancer, stage 1, estrogen receptor negative ([place]) 07/06/2011  . Cancer of breast ([place])   . Cataract   . Chronic ITP (idiopathic thrombocytopenia) ([facility]) 07/06/2011  . Constipation    takes Colace daily  . COPD (chronic obstructive pulmonary disease) ([facility])    mild  . Depression    "nervous break down age 63"  . Fast heart beat   . GERD (gastroesophageal reflux disease)    takes Nexium daily  . History of colon polyps   . History of depression    received shock treatments   . History of palpitations    takes Metoprolol daily  . History of vertigo   . HOH (hard of  hearing)    has hearing aids  . Hyperlipidemia    takes Lovastatin daily  . Hypertension   . Hypothyroid 07/06/2011   takes SYnthroid daily  . Joint pain   . Joint swelling   . Lymphoma ([place])   . Lymphoma ([place])    B cell   . Lymphoma of lymph nodes of head, face, and/or neck ([place]) 07/06/2011  . Neuropathy   . Nocturia   . Osteopenia   . Osteoporosis    hx of and take Reclast  . Peripheral neuropathy   . Personal history of chemotherapy   . Personal history of radiation therapy   . Right knee DJD   . Shortness of breath dyspnea    with exertion  . Thyroid disease   . Umbilical hernia     Patient Active Problem List   Diagnosis Date Noted  . Left shoulder pain 02/08/2018  . History of bladder cancer 02/08/2018  . Anemia, chronic disease 07/27/2017  . Weight loss 11/19/2015  . Chronic constipation 11/19/2015  . Other specified hypothyroidism 11/14/2015  . DJD (degenerative joint disease) of knee 06/19/2013  . Right knee DJD   . Hyperlipidemia   . Thyroid disease   . Chest pain 12/26/2012  . Unstable angina ([place]) 12/03/2012  . Osteoporosis 12/03/2012  . Palpitations 12/03/2012  . Dyslipidemia 12/03/2012  . History of breast cancer  in female 07/06/2011  . History of non-Hodgkin's lymphoma 07/06/2011  . Hypothyroid 07/06/2011  . Chronic ITP (idiopathic thrombocytopenia) (H[facility]) 07/06/2011    Past Surgical History:  Procedure Laterality Date  . ADENOIDECTOMY     as a child  . BREAST LUMPECTOMY Right 1994  . BREAST SURGERY Right   . BUNIONECTOMY Right   . CARDIAC CATHETERIZATION  2014  . COLONOSCOPY    . FOOT SURGERY Right   . FRACTURE SURGERY     wrist right , right knee  . INSERTION OF MESH N/A 04/17/2015   Procedure: INSERTION OF MESH;  Surgeon: Ralene Ok, MD;  Location: [facility];  Service: General;  Laterality: N/A;  . KNEE SURGERY    . LEFT HEART CATHETERIZATION WITH CORONARY ANGIOGRAM N/A 12/05/2012   Procedure: LEFT HEART CATHETERIZATION WITH CORONARY  ANGIOGRAM;  Surgeon: [name] Sine, MD;  Location: [facility] C[facility];  Service: Cardiovascular;  Laterality: N/A;  . right  breast surgery     d/t breast cancer  . TONSILLECTOMY     as a child  . TOTAL KNEE ARTHROPLASTY Right 06/19/2013   Procedure: TOTAL KNEE ARTHROPLASTY;  Surgeon: Lorn Junes, MD;  Location: [facility];  Service: Orthopedics;  Laterality: Right;  . TRANSURETHRAL RESECTION OF BLADDER TUMOR WITH GYRUS (TURBT-GYRUS)     01-26-18 Dr. Louis Meckel  . TRANSURETHRAL RESECTION OF BLADDER TUMOR WITH MITOMYCIN-C Bilateral 01/26/2018   Procedure: TRANSURETHRAL RESECTION OF BLADDER TUMOR WITH GEMCITABIN BLLATERAL RETROGRADE PYELOGRAM;  Surgeon: Ardis Hughs, MD;  Location: [facility];  Service: Urology;  Laterality: Bilateral;  . TURBINATE REDUCTION    . UMBILICAL HERNIA REPAIR N/A 04/17/2015   Procedure: LAPAROSCOPIC UMBILICAL HERNIA REPAIR WITH MESH;  Surgeon: Ralene Ok, MD;  Location: [facility];  Service: General;  Laterality: N/A;  . UPPER GASTROINTESTINAL ENDOSCOPY       OB History   No obstetric history on file.      Home Medications    Prior to Admission medications   Medication Sig Start Date End Date Taking? Authorizing Provider  acetaminophen (TYLENOL) 500 MG tablet Take 1 tablet (500 mg total) by mouth every 6 (six) hours as needed for mild pain. Patient taking differently: Take 1,000 mg by mouth every 8 (eight) hours as needed for mild pain.  04/19/16  Yes Quintella Reichert, MD  amoxicillin (AMOXIL) 500 MG capsule Take 2,000 mg by mouth once as needed (One hour prior to dental appointment).    Yes [provider]  aspirin 81 MG tablet Take 81 mg by mouth daily.   Yes [provider]  augmented betamethasone dipropionate (DIPROLENE-AF) 0.05 % ointment Apply 1 application topically daily as needed (itching).    Yes [provider]  buPROPion (WELLBUTRIN XL) 150 MG 24 hr tablet Take 150 mg by mouth daily.    Yes [provider]  Calcium  Carbonate-Vit D-Min (CALTRATE 600+D PLUS MINERALS) 600-800 MG-UNIT CHEW Chew 2 each by mouth 2 (two) times daily.   Yes [provider]  cholecalciferol (VITAMIN D) 1000 UNITS tablet Take 1,000 Units by mouth daily.   Yes [provider]  citalopram (CELEXA) 40 MG tablet Take 40 mg by mouth daily.    Yes [provider]  Cyanocobalamin (VITAMIN B-12 PO) Take 1,000 mcg by mouth daily.   Yes [provider]  Difluprednate (DUREZOL) 0.05 % EMUL Place 2 drops into the right eye 2 (two) times daily.    Yes [provider]  esomeprazole (NEXIUM) 40 MG capsule Take  40 mg by mouth 2 (two) times daily before a meal.    Yes [provider]  famciclovir (FAMVIR) 250 MG tablet Take 125 mg by mouth as needed (shingles outbreak).   Yes [provider]  L-Methylfolate-Algae-B12-B6 Glade Stanford) 3-90.314-2-35 MG CAPS Take 1 tablet by mouth 2 (two) times daily. 03/20/15  Yes [provider]  levothyroxine (SYNTHROID, LEVOTHROID) 75 MCG tablet Take 75 mcg by mouth daily before breakfast.    Yes [provider]  lovastatin (MEVACOR) 20 MG tablet Take 20 mg by mouth daily.    Yes [provider]  magnesium oxide (MAG-OX) 400 MG tablet Take 400 mg by mouth daily.   Yes [provider]  metoprolol succinate (TOPROL-XL) 25 MG 24 hr tablet Take 25 mg by mouth daily.    Yes [provider]  Multiple Vitamins-Minerals (MULTIVITAMINS THER. W/MINERALS) TABS Take 1 tablet by mouth daily.     Yes [provider]  nystatin-triamcinolone ointment (MYCOLOG) Apply 1 application topically daily as needed (itching).    Yes [provider]  Polyethyl Glycol-Propyl Glycol (SYSTANE) 0.4-0.3 % SOLN Apply 2 drops to eye 2 (two) times daily as needed (for dry eyes).   Yes [provider]  Vitamin D, Ergocalciferol, (DRISDOL) 50000 UNITS CAPS Take 50,000 Units by mouth every 14 (fourteen) days.     Yes [provider]  pregabalin (LYRICA) 50 MG capsule Take 1 capsule (50 mg total) by mouth 2 (two) times daily for 7 days. 05/19/18 05/26/18  Albrizze, Harley Hallmark, PA-C    Family History Family History  Problem Relation Age of Onset  . Hyperlipidemia Father   . Heart disease Father   . Heart disease Sister   . Cancer Brother   . Diabetes Son   . Heart disease Maternal Grandfather   . Colon cancer Neg Hx   . Breast cancer Neg Hx     Social History Social History   Tobacco Use  . Smoking status: Former Smoker    Packs/day: 0.30    Types: Cigarettes  . Smokeless tobacco: Never Used  . Tobacco comment: quit smoking 1985  was a casual smoker  Substance Use Topics  . Alcohol use: No    Alcohol/week: 0.0 standard drinks  . Drug use: No     Allergies   Oxycodone; Codeine; Doxycycline; Meperidine and related; Sulfa antibiotics; and Sulfacetamide sodium   Review of Systems Review of Systems  Constitutional: Negative for chills and fever.  HENT: Negative for congestion, sinus pressure, sore throat and tinnitus.   Eyes: Negative for pain and visual disturbance.  Respiratory: Negative for chest tightness and shortness of breath.   Cardiovascular: Negative for chest pain and palpitations.  Gastrointestinal: Negative for abdominal pain, diarrhea, nausea and vomiting.  Genitourinary: Negative for difficulty urinating, dysuria, frequency and hematuria.  Musculoskeletal: Negative for back pain and neck pain.  Skin: Negative for rash and wound.  Neurological: Positive for dizziness and weakness. Negative for syncope, numbness and headaches.     Physical Exam Updated Vital Signs BP (!) 148/81 (BP Location: Left Arm)   Pulse 69   Temp 98.7 F (37.1 C) (Oral)   Resp 16   Ht 5\' 2"  (1.575 m)   Wt 77.3 kg   SpO2 97%   BMI 31.17 kg/m   Physical Exam Vitals signs and nursing note reviewed.  Constitutional:      Appearance: She is not ill-appearing or toxic-appearing.  HENT:      Head: Normocephalic and atraumatic.  Nose: Nose normal.     Mouth/Throat:     Mouth: Mucous membranes are moist.     Pharynx: Oropharynx is clear.  Eyes:     Conjunctiva/sclera: Conjunctivae normal.  Neck:     Musculoskeletal: Normal range of motion.  Cardiovascular:     Rate and Rhythm: Normal rate and regular rhythm.     Pulses: Normal pulses.     Heart sounds: Normal heart sounds.  Pulmonary:     Effort: Pulmonary effort is normal.     Breath sounds: Normal breath sounds.  Abdominal:     General: There is no distension.     Palpations: Abdomen is soft.     Tenderness: There is no abdominal tenderness. There is no guarding or rebound.  Musculoskeletal:     Comments: Full ROM in all extremities  Skin:    General: Skin is warm and dry.     Capillary Refill: Capillary refill takes less than 2 seconds.  Neurological:     Mental Status: She is alert. Mental status is at baseline.     Motor: No weakness.     Comments: Speech is clear and goal oriented, follows commands CN III-XII intact, no facial droop Normal strength in upper and lower extremities bilaterally including dorsiflexion and plantar flexion, strong and equal grip strength Sensation normal to light and sharp touch Moves extremities without ataxia, coordination intact Normal finger to nose and rapid alternating movements Normal gait and balance   Psychiatric:        Behavior: Behavior normal.      ED Treatments / Results  Labs (all labs ordered are listed, but only abnormal results are displayed) Labs Reviewed  BASIC METABOLIC PANEL - Abnormal; Notable for the following components:      Result Value   CO2 21 (*)    All other components within normal limits  CBC - Abnormal; Notable for the following components:   Hemoglobin 11.0 (*)    HCT 35.7 (*)    All other components within normal limits  URINALYSIS, ROUTINE W REFLEX MICROSCOPIC - Abnormal; Notable for the following components:   Color, Urine STRAW  (*)    Hgb urine dipstick SMALL (*)    All other components within normal limits    EKG EKG Interpretation  Date/Time:  Thursday May 19 2018 15:40:42 EST Ventricular Rate:  72 PR Interval:    QRS Duration: 96 QT Interval:  396 QTC Calculation: 434 R Axis:   32 Text Interpretation:  Sinus rhythm Abnormal R-wave progression, early transition Artifact Baseline wander Abnormal ekg Confirmed by Carmin Muskrat [phone]) on 05/19/2018 7:45:49 PM   Radiology Ct Head Wo Contrast  Result Date: 05/19/2018 CLINICAL DATA:  Dizziness and weakness when the patient stands up. EXAM: CT HEAD WITHOUT CONTRAST TECHNIQUE: Contiguous axial images were obtained from the base of the skull through the vertex without intravenous contrast. COMPARISON:  10/29/2017. FINDINGS: Brain: Mild-to-moderate enlargement of the ventricles and subarachnoid spaces without significant change. Mild patchy white matter low density in both cerebral hemispheres without significant change. No intracranial hemorrhage, mass lesion or CT evidence of acute infarction. Vascular: No hyperdense vessel or unexpected calcification. Skull: Normal. Negative for fracture or focal lesion. Sinuses/Orbits: Status post right cataract extraction. Right ethmoid sinus osteoma. Moderate mucosal thickening in the posterior aspect of the sphenoid sinus on the left without significant change. Other: None. IMPRESSION: 1. No acute abnormality. 2. Stable mild to moderate diffuse cerebral and cerebellar atrophy. 3. Stable mild chronic small vessel white  matter ischemic changes in both cerebral hemispheres. 4. Stable chronic sphenoid sinusitis on the left. Electronically Signed   By: Claudie Revering M.D.   On: 05/19/2018 22:21    Procedures Procedures (including critical care time)  Medications Ordered in ED Medications  sodium chloride flush (NS) 0.9 % injection 3 mL (3 mLs Intravenous Not Given 05/19/18 1900)  pregabalin (LYRICA) capsule 50 mg (50 mg Oral  Given 05/19/18 2231)     Initial Impression / Assessment and Plan / ED Course  I have reviewed the triage vital signs and the nursing notes.  Pertinent labs & imaging results that were available during my care of the patient were reviewed by me and considered in my medical decision making (see chart for details).   Pt is afebrile, well appearing. She had multiple episodes of weakness today. DDX includes stroke, TIA, UTI, vertigo, dehydration. CBC,BMP, UA all unremarkable. Her EKG is unchanged from prior. Her neuro exam is without focal deficit. Given her history of multiple cancers, a head CT was ordered and result is negative for acute abnormality.  Discussed strict ED return precautions and pcp follow up. Pt and her daughter verbalized understanding of and is in agreement with this plan. Pt stable for discharge home at this time. The patient was discussed with and seen by Dr. Vanita Panda who agrees with the treatment plan.  Final Clinical Impressions(s) / ED Diagnoses   Final diagnoses:  Dizziness  Weakness    ED Discharge Orders         Ordered    pregabalin (LYRICA) 50 MG capsule  2 times daily     05/19/18 2305           Flint Melter 05/20/18 Delilah Shan, MD 05/26/18 1728

## 2018-05-19 NOTE — Discharge Instructions (Addendum)
You have been seen today for weakness and dizziness. Please read and follow all provided instructions.   1. Medications: Prescription sent to your pharmacy for Lyrica. You need to follow up with your primary care provider to get your 90 day supply of Lyrica. Continue usual home medications 2. Treatment: rest, drink plenty of fluids 3. Follow Up: Please follow up with your primary doctor in 2-5 days for discussion of your diagnoses and further evaluation after today's visit; if you do not have a primary care doctor use the resource guide provided to find one; Please return to the ER for any new or worsening symptoms. Please obtain all of your results from medical records or have your doctors office obtain the results - share them with your doctor - you should be seen at your doctors office. Call today to arrange your follow up.   Take medications as prescribed. Please review all of the medicines and only take them if you do not have an allergy to them. Return to the emergency room for worsening condition or new concerning symptoms. Follow up with your regular doctor.   ?  ?  You should return to the ER if you develop severe or worsening symptoms.

## 2018-05-19 NOTE — ED Triage Notes (Signed)
Patient reports that she stands up and feels dizzy and having weakness. Patient states she has been out of one of her neuropathy meds x 3-4 days.

## 2018-05-26 DIAGNOSIS — H25812 Combined forms of age-related cataract, left eye: Secondary | ICD-10-CM | POA: Diagnosis not present

## 2018-05-26 DIAGNOSIS — H2512 Age-related nuclear cataract, left eye: Secondary | ICD-10-CM | POA: Diagnosis not present

## 2018-06-07 DIAGNOSIS — Z862 Personal history of diseases of the blood and blood-forming organs and certain disorders involving the immune mechanism: Secondary | ICD-10-CM | POA: Diagnosis not present

## 2018-06-07 DIAGNOSIS — M6281 Muscle weakness (generalized): Secondary | ICD-10-CM | POA: Diagnosis not present

## 2018-06-07 DIAGNOSIS — M6289 Other specified disorders of muscle: Secondary | ICD-10-CM | POA: Diagnosis not present

## 2018-06-07 DIAGNOSIS — I10 Essential (primary) hypertension: Secondary | ICD-10-CM | POA: Diagnosis not present

## 2018-06-07 DIAGNOSIS — E039 Hypothyroidism, unspecified: Secondary | ICD-10-CM | POA: Diagnosis not present

## 2018-06-07 DIAGNOSIS — R351 Nocturia: Secondary | ICD-10-CM | POA: Diagnosis not present

## 2018-06-07 DIAGNOSIS — N3941 Urge incontinence: Secondary | ICD-10-CM | POA: Diagnosis not present

## 2018-06-07 DIAGNOSIS — R3915 Urgency of urination: Secondary | ICD-10-CM | POA: Diagnosis not present

## 2018-06-07 DIAGNOSIS — E7800 Pure hypercholesterolemia, unspecified: Secondary | ICD-10-CM | POA: Diagnosis not present

## 2018-06-10 DIAGNOSIS — H938X2 Other specified disorders of left ear: Secondary | ICD-10-CM | POA: Diagnosis not present

## 2018-06-10 DIAGNOSIS — H9113 Presbycusis, bilateral: Secondary | ICD-10-CM | POA: Insufficient documentation

## 2018-06-10 DIAGNOSIS — L299 Pruritus, unspecified: Secondary | ICD-10-CM | POA: Diagnosis not present

## 2018-06-10 DIAGNOSIS — S01302A Unspecified open wound of left ear, initial encounter: Secondary | ICD-10-CM | POA: Diagnosis not present

## 2018-06-14 DIAGNOSIS — E039 Hypothyroidism, unspecified: Secondary | ICD-10-CM | POA: Diagnosis not present

## 2018-06-14 DIAGNOSIS — F329 Major depressive disorder, single episode, unspecified: Secondary | ICD-10-CM | POA: Diagnosis not present

## 2018-06-14 DIAGNOSIS — E7800 Pure hypercholesterolemia, unspecified: Secondary | ICD-10-CM | POA: Diagnosis not present

## 2018-06-14 DIAGNOSIS — I10 Essential (primary) hypertension: Secondary | ICD-10-CM | POA: Diagnosis not present

## 2018-06-14 DIAGNOSIS — K219 Gastro-esophageal reflux disease without esophagitis: Secondary | ICD-10-CM | POA: Diagnosis not present

## 2018-06-17 DIAGNOSIS — N3941 Urge incontinence: Secondary | ICD-10-CM | POA: Diagnosis not present

## 2018-06-17 DIAGNOSIS — R351 Nocturia: Secondary | ICD-10-CM | POA: Diagnosis not present

## 2018-06-17 DIAGNOSIS — R3915 Urgency of urination: Secondary | ICD-10-CM | POA: Diagnosis not present

## 2018-06-17 DIAGNOSIS — M62838 Other muscle spasm: Secondary | ICD-10-CM | POA: Diagnosis not present

## 2018-06-17 DIAGNOSIS — M6281 Muscle weakness (generalized): Secondary | ICD-10-CM | POA: Diagnosis not present

## 2018-06-17 DIAGNOSIS — M6289 Other specified disorders of muscle: Secondary | ICD-10-CM | POA: Diagnosis not present

## 2018-06-28 DIAGNOSIS — H6042 Cholesteatoma of left external ear: Secondary | ICD-10-CM | POA: Diagnosis not present

## 2018-06-28 DIAGNOSIS — H9113 Presbycusis, bilateral: Secondary | ICD-10-CM | POA: Diagnosis not present

## 2018-06-30 DIAGNOSIS — R3915 Urgency of urination: Secondary | ICD-10-CM | POA: Diagnosis not present

## 2018-06-30 DIAGNOSIS — M62838 Other muscle spasm: Secondary | ICD-10-CM | POA: Diagnosis not present

## 2018-06-30 DIAGNOSIS — M6281 Muscle weakness (generalized): Secondary | ICD-10-CM | POA: Diagnosis not present

## 2018-06-30 DIAGNOSIS — N3941 Urge incontinence: Secondary | ICD-10-CM | POA: Diagnosis not present

## 2018-06-30 DIAGNOSIS — M6289 Other specified disorders of muscle: Secondary | ICD-10-CM | POA: Diagnosis not present

## 2018-07-01 ENCOUNTER — Ambulatory Visit (INDEPENDENT_AMBULATORY_CARE_PROVIDER_SITE_OTHER): Payer: Medicare Other | Admitting: Podiatry

## 2018-07-01 ENCOUNTER — Encounter: Payer: Self-pay | Admitting: Podiatry

## 2018-07-01 DIAGNOSIS — M79676 Pain in unspecified toe(s): Secondary | ICD-10-CM | POA: Diagnosis not present

## 2018-07-01 DIAGNOSIS — B351 Tinea unguium: Secondary | ICD-10-CM | POA: Diagnosis not present

## 2018-07-01 NOTE — Progress Notes (Signed)
Patient ID: GLENOLA WHEAT, female   DOB: 05-15-1934, 84 y.o.   MRN: [id number] Complaint:  Visit Type: Patient returns to my office for continued preventative foot care services. Complaint: Patient states" my nails have grown long and thick and become painful to walk and wear shoes".  He presents for preventative foot care services. No changes to ROS  Podiatric Exam: Vascular: dorsalis pedis and posterior tibial pulses are palpable bilateral. Capillary return is immediate. Temperature gradient is WNL. Skin turgor WNL  Sensorium: Normal Semmes Weinstein monofilament test. Normal tactile sensation bilaterally. Nail Exam: Pt has thick disfigured discolored nails with subungual debris noted bilateral entire nail hallux through fifth toenails Ulcer Exam: There is no evidence of ulcer or pre-ulcerative changes or infection. Orthopedic Exam: Muscle tone and strength are WNL. No limitations in general ROM. No crepitus or effusions noted. Hallux varus 1st MPJ right with hammer toes2,3 right. Bony prominences are unremarkable. Asymptomatic adducto varus fifth digits. Skin: No Porokeratosis. No infection or ulcers.  Diagnosis:  Tinea unguium, Pain in right toe, pain in left toes  Treatment & Plan Procedures and Treatment: Consent by patient was obtained for treatment procedures. The patient understood the discussion of treatment and procedures well. All questions were answered thoroughly reviewed. Debridement of mycotic and hypertrophic toenails, 1 through 5 bilateral and clearing of subungual debris. No ulceration, no infection noted.  Return Visit-Office Procedure: Patient instructed to return to the office for a follow up visit 10 weeks  for continued evaluation and treatment.

## 2018-07-14 DIAGNOSIS — R3915 Urgency of urination: Secondary | ICD-10-CM | POA: Diagnosis not present

## 2018-07-14 DIAGNOSIS — N3941 Urge incontinence: Secondary | ICD-10-CM | POA: Diagnosis not present

## 2018-07-14 DIAGNOSIS — R351 Nocturia: Secondary | ICD-10-CM | POA: Diagnosis not present

## 2018-07-14 DIAGNOSIS — M62838 Other muscle spasm: Secondary | ICD-10-CM | POA: Diagnosis not present

## 2018-07-14 DIAGNOSIS — M6289 Other specified disorders of muscle: Secondary | ICD-10-CM | POA: Diagnosis not present

## 2018-07-14 DIAGNOSIS — M6281 Muscle weakness (generalized): Secondary | ICD-10-CM | POA: Diagnosis not present

## 2018-09-05 ENCOUNTER — Other Ambulatory Visit: Payer: Self-pay | Admitting: Hematology and Oncology

## 2018-09-05 DIAGNOSIS — Z1231 Encounter for screening mammogram for malignant neoplasm of breast: Secondary | ICD-10-CM

## 2018-09-09 ENCOUNTER — Ambulatory Visit (INDEPENDENT_AMBULATORY_CARE_PROVIDER_SITE_OTHER): Payer: Medicare Other | Admitting: Podiatry

## 2018-09-09 ENCOUNTER — Encounter: Payer: Self-pay | Admitting: Podiatry

## 2018-09-09 ENCOUNTER — Other Ambulatory Visit: Payer: Self-pay

## 2018-09-09 VITALS — Temp 97.3°F

## 2018-09-09 DIAGNOSIS — Q828 Other specified congenital malformations of skin: Secondary | ICD-10-CM

## 2018-09-09 DIAGNOSIS — M79676 Pain in unspecified toe(s): Secondary | ICD-10-CM | POA: Diagnosis not present

## 2018-09-09 DIAGNOSIS — B351 Tinea unguium: Secondary | ICD-10-CM

## 2018-09-09 NOTE — Progress Notes (Addendum)
Patient ID: LUCELY LEARD, female   DOB: 31-Jan-1935, 84 y.o.   MRN: [id number] Complaint:  Visit Type: Patient returns to my office for continued preventative foot care services. Complaint: Patient states" my nails have grown long and thick and become painful to walk and wear shoes".  He presents for preventative foot care services. No changes to ROS  Podiatric Exam: Vascular: dorsalis pedis and posterior tibial pulses are palpable bilateral. Capillary return is immediate. Temperature gradient is WNL. Skin turgor WNL  Sensorium: Normal Semmes Weinstein monofilament test. Normal tactile sensation bilaterally. Nail Exam: Pt has thick disfigured discolored nails with subungual debris noted bilateral entire nail hallux through fifth toenails Ulcer Exam: There is no evidence of ulcer or pre-ulcerative changes or infection. Orthopedic Exam: Muscle tone and strength are WNL. No limitations in general ROM. No crepitus or effusions noted. Hallux varus 1st MPJ right with hammer toes2,3 right. Bony prominences are unremarkable. Asymptomatic adducto varus fifth digits. Skin: No Porokeratosis. No infection or ulcers.  Diagnosis:  Tinea unguium, Pain in right toe, pain in left toes  Treatment & Plan Procedures and Treatment: Consent by patient was obtained for treatment procedures. The patient understood the discussion of treatment and procedures well. All questions were answered thoroughly reviewed. Debridement of mycotic and hypertrophic toenails, 1 through 5 bilateral and clearing of subungual debris. No ulceration, no infection noted. ABN signed for 2020. Return Visit-Office Procedure: Patient instructed to return to the office for a follow up visit 10 weeks  for continued evaluation and treatment.

## 2018-10-03 DIAGNOSIS — S61411A Laceration without foreign body of right hand, initial encounter: Secondary | ICD-10-CM | POA: Diagnosis not present

## 2018-10-31 ENCOUNTER — Other Ambulatory Visit: Payer: Self-pay

## 2018-10-31 ENCOUNTER — Ambulatory Visit
Admission: RE | Admit: 2018-10-31 | Discharge: 2018-10-31 | Disposition: A | Discharge status: Home / Self Care | Payer: Medicare Other | Source: Ambulatory Visit | Attending: Hematology and Oncology | Admitting: Hematology and Oncology

## 2018-10-31 DIAGNOSIS — Z1231 Encounter for screening mammogram for malignant neoplasm of breast: Secondary | ICD-10-CM | POA: Diagnosis not present

## 2018-10-31 DIAGNOSIS — Z853 Personal history of malignant neoplasm of breast: Secondary | ICD-10-CM | POA: Diagnosis not present

## 2018-11-08 DIAGNOSIS — M6281 Muscle weakness (generalized): Secondary | ICD-10-CM | POA: Diagnosis not present

## 2018-11-08 DIAGNOSIS — R3915 Urgency of urination: Secondary | ICD-10-CM | POA: Diagnosis not present

## 2018-11-08 DIAGNOSIS — M62838 Other muscle spasm: Secondary | ICD-10-CM | POA: Diagnosis not present

## 2018-11-08 DIAGNOSIS — N3941 Urge incontinence: Secondary | ICD-10-CM | POA: Diagnosis not present

## 2018-11-08 DIAGNOSIS — M6289 Other specified disorders of muscle: Secondary | ICD-10-CM | POA: Diagnosis not present

## 2018-11-08 DIAGNOSIS — R351 Nocturia: Secondary | ICD-10-CM | POA: Diagnosis not present

## 2018-11-15 ENCOUNTER — Encounter: Payer: Self-pay | Admitting: Hematology and Oncology

## 2018-11-15 ENCOUNTER — Other Ambulatory Visit: Payer: Self-pay | Admitting: Hematology and Oncology

## 2018-11-15 ENCOUNTER — Inpatient Hospital Stay: Payer: Medicare Other | Attending: Hematology and Oncology

## 2018-11-15 ENCOUNTER — Inpatient Hospital Stay (HOSPITAL_BASED_OUTPATIENT_CLINIC_OR_DEPARTMENT_OTHER): Payer: Medicare Other | Admitting: Hematology and Oncology

## 2018-11-15 ENCOUNTER — Other Ambulatory Visit: Payer: Self-pay

## 2018-11-15 DIAGNOSIS — D649 Anemia, unspecified: Secondary | ICD-10-CM | POA: Diagnosis not present

## 2018-11-15 DIAGNOSIS — Z7982 Long term (current) use of aspirin: Secondary | ICD-10-CM

## 2018-11-15 DIAGNOSIS — R42 Dizziness and giddiness: Secondary | ICD-10-CM | POA: Insufficient documentation

## 2018-11-15 DIAGNOSIS — K219 Gastro-esophageal reflux disease without esophagitis: Secondary | ICD-10-CM

## 2018-11-15 DIAGNOSIS — Z79899 Other long term (current) drug therapy: Secondary | ICD-10-CM

## 2018-11-15 DIAGNOSIS — D693 Immune thrombocytopenic purpura: Secondary | ICD-10-CM | POA: Diagnosis not present

## 2018-11-15 DIAGNOSIS — Z853 Personal history of malignant neoplasm of breast: Secondary | ICD-10-CM | POA: Diagnosis not present

## 2018-11-15 DIAGNOSIS — D638 Anemia in other chronic diseases classified elsewhere: Secondary | ICD-10-CM

## 2018-11-15 DIAGNOSIS — Z923 Personal history of irradiation: Secondary | ICD-10-CM | POA: Diagnosis not present

## 2018-11-15 DIAGNOSIS — I952 Hypotension due to drugs: Secondary | ICD-10-CM | POA: Diagnosis not present

## 2018-11-15 DIAGNOSIS — I959 Hypotension, unspecified: Secondary | ICD-10-CM | POA: Diagnosis not present

## 2018-11-15 DIAGNOSIS — D696 Thrombocytopenia, unspecified: Secondary | ICD-10-CM | POA: Diagnosis not present

## 2018-11-15 DIAGNOSIS — R5383 Other fatigue: Secondary | ICD-10-CM | POA: Diagnosis not present

## 2018-11-15 DIAGNOSIS — Z9221 Personal history of antineoplastic chemotherapy: Secondary | ICD-10-CM | POA: Diagnosis not present

## 2018-11-15 DIAGNOSIS — R531 Weakness: Secondary | ICD-10-CM | POA: Diagnosis not present

## 2018-11-15 LAB — CBC WITH DIFFERENTIAL/PLATELET
Abs Immature Granulocytes: 0.03 10*3/uL (ref 0.00–0.07)
Basophils Absolute: 0.1 10*3/uL (ref 0.0–0.1)
Basophils Relative: 1 %
Eosinophils Absolute: 0.2 10*3/uL (ref 0.0–0.5)
Eosinophils Relative: 3 %
HCT: 36.8 % (ref 36.0–46.0)
Hemoglobin: 11.6 g/dL — ABNORMAL LOW (ref 12.0–15.0)
Immature Granulocytes: 0 %
Lymphocytes Relative: 19 %
Lymphs Abs: 1.3 10*3/uL (ref 0.7–4.0)
MCH: 28.3 pg (ref 26.0–34.0)
MCHC: 31.5 g/dL (ref 30.0–36.0)
MCV: 89.8 fL (ref 80.0–100.0)
Monocytes Absolute: 0.5 10*3/uL (ref 0.1–1.0)
Monocytes Relative: 7 %
Neutro Abs: 4.9 10*3/uL (ref 1.7–7.7)
Neutrophils Relative %: 70 %
Platelets: 115 10*3/uL — ABNORMAL LOW (ref 150–400)
RBC: 4.1 MIL/uL (ref 3.87–5.11)
RDW: 13.3 % (ref 11.5–15.5)
WBC: 7.1 10*3/uL (ref 4.0–10.5)
nRBC: 0 % (ref 0.0–0.2)

## 2018-11-15 NOTE — Progress Notes (Signed)
[name] OFFICE PROGRESS NOTE  Patient Care Team: Holland Commons, FNP as PCP - General (Internal Medicine)  ASSESSMENT & PLAN:  Chronic ITP (idiopathic thrombocytopenia) ([place]) She is not symptomatic Her last 3 platelet counts are stable The patient is educated to watch out for signs and symptoms of bleeding or excessive bruising She will continue aspirin therapy as long as her platelet count is above 50,000 We will see her back in a year  Anemia, chronic disease She has mild anemia chronic disease, overall stable She is not symptomatic Observe only.  Hypotension Her blood pressure is noted to be low She is a little dizzy I recommend her to contact her primary care doctor for further management   No orders of the defined types were placed in this encounter.   INTERVAL HISTORY: Please see below for problem oriented charting. She returns for further follow-up for anemia and low platelet count She feels well except for a little dizziness She had normal mammogram and denies new breast changes No recent bleeding  SUMMARY OF ONCOLOGIC HISTORY:  Darlene [name] was transferred to my care after her prior physician has left.  I reviewed the patient's records extensive and collaborated the history with the patient. 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 She never required any systemic treatment. 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 This has also resolved on its own. She has never developed any additional adenopathy other than the single lymph node palpable in the left supraclavicular fossa.  She did develop a second primary stage I,  0.8 cm,  ER negative cancer of the right breast, treated with lumpectomy, radiation and six cycles of  CMF chemotherapy in 1994. No evidence of a new disease from the breast cancer either. Most recent mammogram done in June 2015 showed stable fibroglandular changes in her breasts. She was called back for an ultrasound of the left breast which also came back benign. She had a CT scan of the abdomen done on 11/14/15 because of significant intentional weight loss and abdominal discomfort.  Imaging studies show no evidence of lymphoma.  Moderate stool burden was noted. Repeat CT scan in March 2019 show stable lymphadenopathy with no signs or symptoms of cancer recurrence On 01/26/2018, she underwent transurethral bladder resection of the tumor by urologist  REVIEW OF SYSTEMS:   Constitutional: Denies fevers, chills or abnormal weight loss Eyes: Denies blurriness of vision Ears, nose, mouth, throat, and face: Denies mucositis or sore throat Respiratory: Denies cough, dyspnea or wheezes Cardiovascular: Denies palpitation, chest discomfort or lower extremity swelling Gastrointestinal:  Denies nausea, heartburn or change in bowel habits Skin: Denies abnormal skin rashes Lymphatics: Denies new lymphadenopathy or easy bruising Neurological:Denies numbness, tingling or new weaknesses Behavioral/Psych: Mood is stable, no new changes  All other systems were reviewed with the patient and are negative.  I have reviewed the past medical history, past surgical history, social history and family history with the patient and they are unchanged from previous note.  ALLERGIES:  is allergic to oxycodone; codeine; doxycycline; meperidine and related; sulfa antibiotics; and sulfacetamide sodium.  MEDICATIONS:  Current Outpatient Medications  Medication Sig Dispense Refill  . acetaminophen (TYLENOL) 500 MG tablet Take 1 tablet (500 mg total) by mouth every 6 (six) hours as needed for mild pain. (Patient taking  differently: Take 1,000 mg by mouth every 8 (eight) hours as needed for mild pain. ) 30 tablet 0  .  alclomethasone (ACLOVATE) 0.05 % cream     . amoxicillin (AMOXIL) 500 MG capsule Take 2,000 mg by mouth once as needed (One hour prior to dental appointment).     Marland Kitchen aspirin 81 MG tablet Take 81 mg by mouth daily.    Marland Kitchen augmented betamethasone dipropionate (DIPROLENE-AF) 0.05 % ointment Apply 1 application topically daily as needed (itching).     Marland Kitchen buPROPion (WELLBUTRIN XL) 150 MG 24 hr tablet Take 150 mg by mouth daily.     . Calcium Carbonate-Vit D-Min (CALTRATE 600+D PLUS MINERALS) 600-800 MG-UNIT CHEW Chew 2 each by mouth 2 (two) times daily.    . cholecalciferol (VITAMIN D) 1000 UNITS tablet Take 1,000 Units by mouth daily.    . citalopram (CELEXA) 40 MG tablet Take 40 mg by mouth daily.     . Cyanocobalamin (VITAMIN B-12 PO) Take 1,000 mcg by mouth daily.    . Difluprednate (DUREZOL) 0.05 % EMUL Place 2 drops into the right eye 2 (two) times daily.     Marland Kitchen esomeprazole (NEXIUM) 40 MG capsule Take 40 mg by mouth 2 (two) times daily before a meal.     . famciclovir (FAMVIR) 125 MG tablet     . famciclovir (FAMVIR) 250 MG tablet Take 125 mg by mouth as needed (shingles outbreak).    Marland Kitchen L-Methylfolate-Algae-B12-B6 (METANX) 3-90.314-2-35 MG CAPS Take 1 tablet by mouth 2 (two) times daily.    Marland Kitchen levothyroxine (SYNTHROID, LEVOTHROID) 75 MCG tablet Take 75 mcg by mouth daily before breakfast.     . lovastatin (MEVACOR) 20 MG tablet Take 20 mg by mouth daily.     . magnesium oxide (MAG-OX) 400 MG tablet Take 400 mg by mouth daily.    . metoprolol succinate (TOPROL-XL) 25 MG 24 hr tablet Take 25 mg by mouth daily.     . Multiple Vitamins-Minerals (MULTIVITAMINS THER. W/MINERALS) TABS Take 1 tablet by mouth daily.      Marland Kitchen MYRBETRIQ 25 MG TB24 tablet     . nystatin-triamcinolone ointment (MYCOLOG) Apply 1 application topically daily as needed (itching).     Vladimir Faster Glycol-Propyl Glycol (SYSTANE) 0.4-0.3 % SOLN Apply 2 drops to eye 2 (two) times daily as needed (for dry eyes).    . pregabalin (LYRICA) 50  MG capsule Take 1 capsule (50 mg total) by mouth 2 (two) times daily for 7 days. 14 capsule 0  . pregabalin (LYRICA) 50 MG capsule     . Vitamin D, Ergocalciferol, (DRISDOL) 50000 UNITS CAPS Take 50,000 Units by mouth every 14 (fourteen) days.       No current facility-administered medications for this visit.     PHYSICAL EXAMINATION: ECOG PERFORMANCE STATUS: 1 - Symptomatic but completely ambulatory  Vitals:   11/15/18 1144  BP: (!) 91/46  Pulse: 71  Resp: 18  Temp: 98 F (36.7 C)  SpO2: 96%   Filed Weights   11/15/18 1144  Weight: 164 lb (74.4 kg)    GENERAL:alert, no distress and comfortable Musculoskeletal:no cyanosis of digits and no clubbing  NEURO: alert & oriented x 3 with fluent speech, no focal motor/sensory deficits  LABORATORY DATA:  I have reviewed the data as listed    Component Value Date/Time   NA 136 05/19/2018 1551   NA 135 (L) 07/07/2016 1035   K 4.0 05/19/2018 1551   K 4.8 07/07/2016 1035   CL 105  05/19/2018 1551   CL 106 07/11/2012 0906   CO2 21 (L) 05/19/2018 1551   CO2 24 07/07/2016 1035   GLUCOSE 89 05/19/2018 1551   GLUCOSE 85 07/07/2016 1035   GLUCOSE 107 (H) 07/11/2012 0906   BUN 13 05/19/2018 1551   BUN 19.9 07/07/2016 1035   CREATININE 0.62 05/19/2018 1551   CREATININE 0.7 07/07/2016 1035   CALCIUM 9.6 05/19/2018 1551   CALCIUM 10.2 07/07/2016 1035   PROT 6.9 07/08/2017 1053   PROT 6.6 07/07/2016 1035   ALBUMIN 3.9 07/08/2017 1053   ALBUMIN 3.9 07/07/2016 1035   AST 14 07/08/2017 1053   AST 14 07/07/2016 1035   ALT 19 07/08/2017 1053   ALT 17 07/07/2016 1035   ALKPHOS 60 07/08/2017 1053   ALKPHOS 58 07/07/2016 1035   BILITOT 0.7 07/08/2017 1053   BILITOT 0.61 07/07/2016 1035   GFRNONAA >60 05/19/2018 1551   GFRAA >60 05/19/2018 1551    No results found for: SPEP, UPEP  Lab Results  Component Value Date   WBC 7.1 11/15/2018   NEUTROABS 4.9 11/15/2018   HGB 11.6 (L) 11/15/2018   HCT 36.8 11/15/2018   MCV 89.8  11/15/2018   PLT 115 (L) 11/15/2018      Chemistry      Component Value Date/Time   NA 136 05/19/2018 1551   NA 135 (L) 07/07/2016 1035   K 4.0 05/19/2018 1551   K 4.8 07/07/2016 1035   CL 105 05/19/2018 1551   CL 106 07/11/2012 0906   CO2 21 (L) 05/19/2018 1551   CO2 24 07/07/2016 1035   BUN 13 05/19/2018 1551   BUN 19.9 07/07/2016 1035   CREATININE 0.62 05/19/2018 1551   CREATININE 0.7 07/07/2016 1035      Component Value Date/Time   CALCIUM 9.6 05/19/2018 1551   CALCIUM 10.2 07/07/2016 1035   ALKPHOS 60 07/08/2017 1053   ALKPHOS 58 07/07/2016 1035   AST 14 07/08/2017 1053   AST 14 07/07/2016 1035   ALT 19 07/08/2017 1053   ALT 17 07/07/2016 1035   BILITOT 0.7 07/08/2017 1053   BILITOT 0.61 07/07/2016 1035       RADIOGRAPHIC STUDIES: I have personally reviewed the radiological images as listed and agreed with the findings in the report. Mm 3d Screen Breast Bilateral  Result Date: 11/01/2018 CLINICAL DATA:  Screening. History of RIGHT breast cancer with lumpectomy and treatment in 2013. EXAM: DIGITAL SCREENING BILATERAL MAMMOGRAM WITH TOMO AND CAD COMPARISON:  Previous exam(s). ACR Breast Density Category b: There are scattered areas of fibroglandular density. FINDINGS: There are no findings suspicious for malignancy. RIGHT lumpectomy changes again noted. Images were processed with CAD. IMPRESSION: No mammographic evidence of malignancy. A result letter of this screening mammogram will be mailed directly to the patient. RECOMMENDATION: Screening mammogram in one year. (Code:SM-B-01Y) BI-RADS CATEGORY  2: Benign. Electronically Signed   By: Margarette Canada M.D.   On: 11/01/2018 08:43    All questions were answered. The patient knows to call the clinic with any problems, questions or concerns. No barriers to learning was detected.  I spent 15 minutes counseling the patient face to face. The total time spent in the appointment was 20 minutes and more than 50% was on counseling  and review of test results  Heath Lark, MD 11/15/2018 1:00 PM

## 2018-11-15 NOTE — Assessment & Plan Note (Signed)
Her blood pressure is noted to be low She is a little dizzy I recommend her to contact her primary care doctor for further management

## 2018-11-15 NOTE — Assessment & Plan Note (Signed)
She is not symptomatic Her last 3 platelet counts are stable The patient is educated to watch out for signs and symptoms of bleeding or excessive bruising She will continue aspirin therapy as long as her platelet count is above 50,000 We will see her back in a year 

## 2018-11-15 NOTE — Assessment & Plan Note (Signed)
She has mild anemia chronic disease, overall stable She is not symptomatic Observe only.

## 2018-11-16 ENCOUNTER — Telehealth: Payer: Self-pay | Admitting: Hematology and Oncology

## 2018-11-16 NOTE — Telephone Encounter (Signed)
I talk with patient regarding schedule  

## 2018-11-18 ENCOUNTER — Encounter: Payer: Self-pay | Admitting: Podiatry

## 2018-11-18 ENCOUNTER — Ambulatory Visit (INDEPENDENT_AMBULATORY_CARE_PROVIDER_SITE_OTHER): Payer: Medicare Other | Admitting: Podiatry

## 2018-11-18 ENCOUNTER — Other Ambulatory Visit: Payer: Self-pay

## 2018-11-18 VITALS — Temp 97.7°F

## 2018-11-18 DIAGNOSIS — B351 Tinea unguium: Secondary | ICD-10-CM | POA: Diagnosis not present

## 2018-11-18 DIAGNOSIS — M79676 Pain in unspecified toe(s): Secondary | ICD-10-CM | POA: Diagnosis not present

## 2018-11-18 NOTE — Progress Notes (Signed)
Patient ID: Darlene [name], female   DOB: 1934-06-24, 84 y.o.   MRN: [id number] Complaint:  Visit Type: Patient returns to my office for continued preventative foot care services. Complaint: Patient states" my nails have grown long and thick and become painful to walk and wear shoes".  He presents for preventative foot care services. No changes to ROS  Podiatric Exam: Vascular: dorsalis pedis and posterior tibial pulses are palpable bilateral. Capillary return is immediate. Temperature gradient is WNL. Skin turgor WNL  Sensorium: Normal Semmes Weinstein monofilament test. Normal tactile sensation bilaterally. Nail Exam: Pt has thick disfigured discolored nails with subungual debris noted bilateral entire nail hallux through fifth toenails Ulcer Exam: There is no evidence of ulcer or pre-ulcerative changes or infection. Orthopedic Exam: Muscle tone and strength are WNL. No limitations in general ROM. No crepitus or effusions noted. Hallux varus 1st MPJ right with hammer toes2,3 right. Bony prominences are unremarkable. Asymptomatic adducto varus fifth digits. Skin: No Porokeratosis. No infection or ulcers.  Diagnosis:  Tinea unguium, Pain in right toe, pain in left toes  Treatment & Plan Procedures and Treatment: Consent by patient was obtained for treatment procedures. The patient understood the discussion of treatment and procedures well. All questions were answered thoroughly reviewed. Debridement of mycotic and hypertrophic toenails, 1 through 5 bilateral and clearing of subungual debris. No ulceration, no infection noted.  Return Visit-Office Procedure: Patient instructed to return to the office for a follow up visit 10 weeks  for continued evaluation and treatment.

## 2018-11-21 DIAGNOSIS — R3915 Urgency of urination: Secondary | ICD-10-CM | POA: Diagnosis not present

## 2018-11-21 DIAGNOSIS — R351 Nocturia: Secondary | ICD-10-CM | POA: Diagnosis not present

## 2018-11-21 DIAGNOSIS — M62838 Other muscle spasm: Secondary | ICD-10-CM | POA: Diagnosis not present

## 2018-11-21 DIAGNOSIS — M6289 Other specified disorders of muscle: Secondary | ICD-10-CM | POA: Diagnosis not present

## 2018-11-21 DIAGNOSIS — N3941 Urge incontinence: Secondary | ICD-10-CM | POA: Diagnosis not present

## 2018-11-21 DIAGNOSIS — M6281 Muscle weakness (generalized): Secondary | ICD-10-CM | POA: Diagnosis not present

## 2018-11-22 DIAGNOSIS — R2681 Unsteadiness on feet: Secondary | ICD-10-CM | POA: Diagnosis not present

## 2018-11-22 DIAGNOSIS — E860 Dehydration: Secondary | ICD-10-CM | POA: Diagnosis not present

## 2018-11-28 ENCOUNTER — Emergency Department (HOSPITAL_COMMUNITY): Payer: Medicare Other

## 2018-11-28 ENCOUNTER — Other Ambulatory Visit: Payer: Self-pay

## 2018-11-28 ENCOUNTER — Encounter (HOSPITAL_COMMUNITY): Payer: Self-pay

## 2018-11-28 ENCOUNTER — Emergency Department (HOSPITAL_COMMUNITY)
Admission: EM | Admit: 2018-11-28 | Discharge: 2018-11-28 | Disposition: A | Discharge status: Home / Self Care | Payer: Medicare Other | Attending: Emergency Medicine | Admitting: Emergency Medicine

## 2018-11-28 DIAGNOSIS — Z7982 Long term (current) use of aspirin: Secondary | ICD-10-CM | POA: Insufficient documentation

## 2018-11-28 DIAGNOSIS — Z87891 Personal history of nicotine dependence: Secondary | ICD-10-CM | POA: Diagnosis not present

## 2018-11-28 DIAGNOSIS — S0990XA Unspecified injury of head, initial encounter: Secondary | ICD-10-CM

## 2018-11-28 DIAGNOSIS — S0083XA Contusion of other part of head, initial encounter: Secondary | ICD-10-CM | POA: Insufficient documentation

## 2018-11-28 DIAGNOSIS — Z8572 Personal history of non-Hodgkin lymphomas: Secondary | ICD-10-CM | POA: Insufficient documentation

## 2018-11-28 DIAGNOSIS — I10 Essential (primary) hypertension: Secondary | ICD-10-CM | POA: Insufficient documentation

## 2018-11-28 DIAGNOSIS — Z79899 Other long term (current) drug therapy: Secondary | ICD-10-CM | POA: Diagnosis not present

## 2018-11-28 DIAGNOSIS — W19XXXA Unspecified fall, initial encounter: Secondary | ICD-10-CM

## 2018-11-28 DIAGNOSIS — W0110XA Fall on same level from slipping, tripping and stumbling with subsequent striking against unspecified object, initial encounter: Secondary | ICD-10-CM | POA: Diagnosis not present

## 2018-11-28 DIAGNOSIS — Z853 Personal history of malignant neoplasm of breast: Secondary | ICD-10-CM | POA: Diagnosis not present

## 2018-11-28 DIAGNOSIS — Y999 Unspecified external cause status: Secondary | ICD-10-CM | POA: Insufficient documentation

## 2018-11-28 DIAGNOSIS — Y929 Unspecified place or not applicable: Secondary | ICD-10-CM | POA: Insufficient documentation

## 2018-11-28 DIAGNOSIS — Y939 Activity, unspecified: Secondary | ICD-10-CM | POA: Diagnosis not present

## 2018-11-28 DIAGNOSIS — Z96651 Presence of right artificial knee joint: Secondary | ICD-10-CM | POA: Insufficient documentation

## 2018-11-28 DIAGNOSIS — R51 Headache: Secondary | ICD-10-CM | POA: Diagnosis not present

## 2018-11-28 DIAGNOSIS — E039 Hypothyroidism, unspecified: Secondary | ICD-10-CM | POA: Insufficient documentation

## 2018-11-28 NOTE — ED Notes (Signed)
ED Provider at bedside. 

## 2018-11-28 NOTE — ED Triage Notes (Signed)
Patient reports that she lost her balance and fell on her forehead. Patient has a hematoma to the left forehead area. Patient takes Aspirin daily. No blurred vision. No LOC.

## 2018-11-28 NOTE — ED Notes (Signed)
An After Visit Summary was printed and given to the patient. Discharge instructions given and no further questions at this time.  Pt leaving with daughter.  

## 2018-11-28 NOTE — ED Provider Notes (Signed)
[facility] DEPT Provider Note   CSN: [id number] Arrival date & time: 11/28/18  1447    History   Chief Complaint Chief Complaint  Patient presents with  . Fall  . Head Injury    HPI Darlene [name] is a 84 y.o. female.     HPI Patient lost her balance and fell earlier today.  Hit her left forehead.  No other injury.  She is on aspirin.  No loss conscious.  Does have bruising around her eye.  No vision changes.  No neck pain.  No numbness or weakness.  No nausea or vomiting. Past Medical History:  Diagnosis Date  . Anxiety   . Breast cancer, stage 1, estrogen receptor negative ([place]) 07/06/2011  . Cancer of breast ([place])   . Cataract   . Chronic ITP (idiopathic thrombocytopenia) ([facility]) 07/06/2011  . Constipation    takes Colace daily  . COPD (chronic obstructive pulmonary disease) ([facility])    mild  . Depression    "nervous break down age 40"  . Fast heart beat   . GERD (gastroesophageal reflux disease)    takes Nexium daily  . History of colon polyps   . History of depression    received shock treatments   . History of palpitations    takes Metoprolol daily  . History of vertigo   . HOH (hard of hearing)    has hearing aids  . Hyperlipidemia    takes Lovastatin daily  . Hypertension   . Hypothyroid 07/06/2011   takes SYnthroid daily  . Joint pain   . Joint swelling   . Lymphoma ([place])   . Lymphoma ([place])    B cell   . Lymphoma of lymph nodes of head, face, and/or neck ([place]) 07/06/2011  . Neuropathy   . Nocturia   . Osteopenia   . Osteoporosis    hx of and take Reclast  . Peripheral neuropathy   . Personal history of chemotherapy   . Personal history of radiation therapy   . Right knee DJD   . Shortness of breath dyspnea    with exertion  . Thyroid disease   . Umbilical hernia     Patient Active Problem List   Diagnosis Date Noted  . Hypotension 11/15/2018  . Left shoulder pain 02/08/2018  . History of bladder cancer  02/08/2018  . Anemia, chronic disease 07/27/2017  . Weight loss 11/19/2015  . Chronic constipation 11/19/2015  . Other specified hypothyroidism 11/14/2015  . DJD (degenerative joint disease) of knee 06/19/2013  . Right knee DJD   . Hyperlipidemia   . Thyroid disease   . Chest pain 12/26/2012  . Unstable angina ([place]) 12/03/2012  . Osteoporosis 12/03/2012  . Palpitations 12/03/2012  . Dyslipidemia 12/03/2012  . History of breast cancer in female 07/06/2011  . History of non-Hodgkin's lymphoma 07/06/2011  . Hypothyroid 07/06/2011  . Chronic ITP (idiopathic thrombocytopenia) ([facility]) 07/06/2011    Past Surgical History:  Procedure Laterality Date  . ADENOIDECTOMY     as a child  . BREAST LUMPECTOMY Right 1994  . BREAST SURGERY Right   . BUNIONECTOMY Right   . CARDIAC CATHETERIZATION  2014  . COLONOSCOPY    . FOOT SURGERY Right   . FRACTURE SURGERY     wrist right , right knee  . INSERTION OF MESH N/A 04/17/2015   Procedure: INSERTION OF MESH;  Surgeon: Ralene Ok, MD;  Location: [facility];  Service: General;  Laterality: N/A;  . KNEE  SURGERY    . LEFT HEART CATHETERIZATION WITH CORONARY ANGIOGRAM N/A 12/05/2012   Procedure: LEFT HEART CATHETERIZATION WITH CORONARY ANGIOGRAM;  Surgeon: [name] Sine, MD;  Location[facility] C[facility];  Service: Cardiovascular;  Laterality: N/A;  . right  breast surgery     d/t breast cancer  . TONSILLECTOMY     as a child  . TOTAL KNEE ARTHROPLASTY Right 06/19/2013   Procedure: TOTAL KNEE ARTHROPLASTY;  Surgeon: Lorn Junes, MD;  Location: [facility];  Service: Orthopedics;  Laterality: Right;  . TRANSURETHRAL RESECTION OF BLADDER TUMOR WITH GYRUS (TURBT-GYRUS)     01-26-18 Dr. Louis Meckel  . TRANSURETHRAL RESECTION OF BLADDER TUMOR WITH MITOMYCIN-C Bilateral 01/26/2018   Procedure: TRANSURETHRAL RESECTION OF BLADDER TUMOR WITH GEMCITABIN BLLATERAL RETROGRADE PYELOGRAM;  Surgeon: Ardis Hughs, MD;  Location: [facility];  Service: Urology;  Laterality:  Bilateral;  . TURBINATE REDUCTION    . UMBILICAL HERNIA REPAIR N/A 04/17/2015   Procedure: LAPAROSCOPIC UMBILICAL HERNIA REPAIR WITH MESH;  Surgeon: Ralene Ok, MD;  Location: [facility];  Service: General;  Laterality: N/A;  . UPPER GASTROINTESTINAL ENDOSCOPY       OB History   No obstetric history on file.      Home Medications    Prior to Admission medications   Medication Sig Start Date End Date Taking? Authorizing Provider  acetaminophen (TYLENOL) 500 MG tablet Take 1 tablet (500 mg total) by mouth every 6 (six) hours as needed for mild pain. Patient taking differently: Take 1,000 mg by mouth every 8 (eight) hours as needed for mild pain.  04/19/16   Quintella Reichert, MD  alclomethasone (ACLOVATE) 0.05 % cream  06/10/18   [provider]  amoxicillin (AMOXIL) 500 MG capsule Take 2,000 mg by mouth once as needed (One hour prior to dental appointment).     [provider]  aspirin 81 MG tablet Take 81 mg by mouth daily.    [provider]  augmented betamethasone dipropionate (DIPROLENE-AF) 0.05 % ointment Apply 1 application topically daily as needed (itching).     [provider]  buPROPion (WELLBUTRIN XL) 150 MG 24 hr tablet Take 150 mg by mouth daily.     [provider]  Calcium Carbonate-Vit D-Min (CALTRATE 600+D PLUS MINERALS) 600-800 MG-UNIT CHEW Chew 2 each by mouth 2 (two) times daily.    [provider]  cholecalciferol (VITAMIN D) 1000 UNITS tablet Take 1,000 Units by mouth daily.    [provider]  citalopram (CELEXA) 40 MG tablet Take 40 mg by mouth daily.     [provider]  Cyanocobalamin (VITAMIN B-12 PO) Take 1,000 mcg by mouth daily.    [provider]  Difluprednate (DUREZOL) 0.05 % EMUL Place 2 drops into the right eye 2 (two) times daily.     [provider]  ergocalciferol (VITAMIN D2) 1.25 MG (50000 UT) capsule  06/18/18   [provider]  esomeprazole (NEXIUM) 40  MG capsule Take 40 mg by mouth 2 (two) times daily before a meal.     [provider]  famciclovir [facility]) 125 MG tablet  08/15/18   [provider]  L-Methylfolate-Algae-B12-B6 [facility]) 3-90.314-2-35 MG CAPS  09/14/18   [provider]  levothyroxine (SYNTHROID, LEVOTHROID) 75 MCG tablet Take 75 mcg by mouth daily before breakfast.     [provider]  lovastatin (MEVACOR) 20 MG tablet Take 20 mg by mouth daily.     [provider]  magnesium oxide (MAG-OX) 400 MG  tablet Take 400 mg by mouth daily.    [provider]  metoprolol succinate (TOPROL-XL) 25 MG 24 hr tablet Take 25 mg by mouth daily.     [provider]  Multiple Vitamins-Minerals (MULTIVITAMINS THER. W/MINERALS) TABS Take 1 tablet by mouth daily.      [provider]  MYRBETRIQ 25 MG TB24 tablet  05/25/18   [provider]  nystatin-triamcinolone ointment (MYCOLOG) Apply 1 application topically daily as needed (itching).     [provider]  Polyethyl Glycol-Propyl Glycol (SYSTANE) 0.4-0.3 % SOLN Apply 2 drops to eye 2 (two) times daily as needed (for dry eyes).    [provider]  pregabalin (LYRICA) 50 MG capsule  10/14/18   [provider]    Family History Family History  Problem Relation Age of Onset  . Hyperlipidemia Father   . Heart disease Father   . Heart disease Sister   . Cancer Brother   . Diabetes Son   . Heart disease Maternal Grandfather   . Colon cancer Neg Hx   . Breast cancer Neg Hx     Social History Social History   Tobacco Use  . Smoking status: Former Smoker    Packs/day: 0.30    Types: Cigarettes  . Smokeless tobacco: Never Used  . Tobacco comment: quit smoking 1985  was a casual smoker  Substance Use Topics  . Alcohol use: No    Alcohol/week: 0.0 standard drinks  . Drug use: No     Allergies   Oxycodone, Codeine, Doxycycline, Meperidine and related, Sulfa antibiotics, and  Sulfacetamide sodium   Review of Systems Review of Systems  Constitutional: Negative for appetite change and unexpected weight change.  HENT: Negative for congestion.   Respiratory: Negative for shortness of breath.   Genitourinary: Negative for frequency.  Musculoskeletal: Negative for back pain and neck pain.  Skin: Negative for wound.  Neurological: Negative for weakness.  Hematological: Does not bruise/bleed easily.  Psychiatric/Behavioral: Negative for confusion.     Physical Exam Updated Vital Signs BP (!) 114/54   Pulse 60   Temp 98.3 F (36.8 C)   Resp 18   Ht 5\' 2"  (1.575 m)   Wt 74.4 kg   SpO2 97%   BMI 30.00 kg/m   Physical Exam Vitals signs and nursing note reviewed.  HENT:     Head:     Comments: Hematoma above the left eye.  Eye movements intact.  Some periorbital ecchymosis.    Mouth/Throat:     Mouth: Mucous membranes are moist.  Neck:     Musculoskeletal: Neck supple.  Cardiovascular:     Rate and Rhythm: Normal rate.  Abdominal:     Tenderness: There is no abdominal tenderness.  Neurological:     Mental Status: She is alert.      ED Treatments / Results  Labs (all labs ordered are listed, but only abnormal results are displayed) Labs Reviewed - No data to display  EKG None  Radiology Ct Head Wo Contrast  Result Date: 11/28/2018 CLINICAL DATA:  Head trauma, headache.  Fall EXAM: CT HEAD WITHOUT CONTRAST TECHNIQUE: Contiguous axial images were obtained from the base of the skull through the vertex without intravenous contrast. COMPARISON:  CT head 05/19/2018 FINDINGS: Brain: Generalized atrophy unchanged. Negative for hydrocephalus. Chronic white matter changes bilaterally stable. Negative for acute infarct, hemorrhage, mass.  No midline shift. Vascular: Normal arterial flow voids. Skull: Negative Sinuses/Orbits: Negative Other: None IMPRESSION: No acute abnormality no change  from the prior study. Electronically Signed   By: Franchot Gallo  M.D.   On: 11/28/2018 16:42    Procedures Procedures (including critical care time)  Medications Ordered in ED Medications - No data to display   Initial Impression / Assessment and Plan / ED Course  I have reviewed the triage vital signs and the nursing notes.  Pertinent labs & imaging results that were available during my care of the patient were reviewed by me and considered in my medical decision making (see chart for details).        Patient with fall.  Hit head.  Has periorbital ecchymosis.  Head CT reassuring and does include the orbit.  Cervical spine clinically cleared.  Discharge home.  No other apparent injury.  Final Clinical Impressions(s) / ED Diagnoses   Final diagnoses:  Fall, initial encounter  Minor head injury, initial encounter  Contusion of face, initial encounter    ED Discharge Orders    None       Davonna Belling, MD 11/28/18 [phone]

## 2018-11-29 DIAGNOSIS — S00202A Unspecified superficial injury of left eyelid and periocular area, initial encounter: Secondary | ICD-10-CM | POA: Diagnosis not present

## 2018-12-06 DIAGNOSIS — E039 Hypothyroidism, unspecified: Secondary | ICD-10-CM | POA: Diagnosis not present

## 2018-12-06 DIAGNOSIS — E785 Hyperlipidemia, unspecified: Secondary | ICD-10-CM | POA: Diagnosis not present

## 2018-12-06 DIAGNOSIS — E559 Vitamin D deficiency, unspecified: Secondary | ICD-10-CM | POA: Diagnosis not present

## 2018-12-06 DIAGNOSIS — R300 Dysuria: Secondary | ICD-10-CM | POA: Diagnosis not present

## 2018-12-06 DIAGNOSIS — N390 Urinary tract infection, site not specified: Secondary | ICD-10-CM | POA: Diagnosis not present

## 2018-12-07 DIAGNOSIS — S00202D Unspecified superficial injury of left eyelid and periocular area, subsequent encounter: Secondary | ICD-10-CM | POA: Diagnosis not present

## 2018-12-12 DIAGNOSIS — M6281 Muscle weakness (generalized): Secondary | ICD-10-CM | POA: Diagnosis not present

## 2018-12-12 DIAGNOSIS — E039 Hypothyroidism, unspecified: Secondary | ICD-10-CM | POA: Diagnosis not present

## 2018-12-12 DIAGNOSIS — Z0000 Encounter for general adult medical examination without abnormal findings: Secondary | ICD-10-CM | POA: Diagnosis not present

## 2018-12-12 DIAGNOSIS — Z9181 History of falling: Secondary | ICD-10-CM | POA: Diagnosis not present

## 2018-12-12 DIAGNOSIS — M6289 Other specified disorders of muscle: Secondary | ICD-10-CM | POA: Diagnosis not present

## 2018-12-12 DIAGNOSIS — R351 Nocturia: Secondary | ICD-10-CM | POA: Diagnosis not present

## 2018-12-12 DIAGNOSIS — I10 Essential (primary) hypertension: Secondary | ICD-10-CM | POA: Diagnosis not present

## 2018-12-12 DIAGNOSIS — E559 Vitamin D deficiency, unspecified: Secondary | ICD-10-CM | POA: Diagnosis not present

## 2018-12-12 DIAGNOSIS — M62838 Other muscle spasm: Secondary | ICD-10-CM | POA: Diagnosis not present

## 2018-12-12 DIAGNOSIS — E7800 Pure hypercholesterolemia, unspecified: Secondary | ICD-10-CM | POA: Diagnosis not present

## 2018-12-12 DIAGNOSIS — N3941 Urge incontinence: Secondary | ICD-10-CM | POA: Diagnosis not present

## 2018-12-15 DIAGNOSIS — H6042 Cholesteatoma of left external ear: Secondary | ICD-10-CM | POA: Diagnosis not present

## 2018-12-21 DIAGNOSIS — Z1212 Encounter for screening for malignant neoplasm of rectum: Secondary | ICD-10-CM | POA: Diagnosis not present

## 2018-12-21 DIAGNOSIS — Z1211 Encounter for screening for malignant neoplasm of colon: Secondary | ICD-10-CM | POA: Diagnosis not present

## 2019-01-03 DIAGNOSIS — N3941 Urge incontinence: Secondary | ICD-10-CM | POA: Diagnosis not present

## 2019-01-03 DIAGNOSIS — M6281 Muscle weakness (generalized): Secondary | ICD-10-CM | POA: Diagnosis not present

## 2019-01-03 DIAGNOSIS — M6289 Other specified disorders of muscle: Secondary | ICD-10-CM | POA: Diagnosis not present

## 2019-01-03 DIAGNOSIS — M62838 Other muscle spasm: Secondary | ICD-10-CM | POA: Diagnosis not present

## 2019-01-09 DIAGNOSIS — B372 Candidiasis of skin and nail: Secondary | ICD-10-CM | POA: Diagnosis not present

## 2019-01-10 DIAGNOSIS — B372 Candidiasis of skin and nail: Secondary | ICD-10-CM | POA: Diagnosis not present

## 2019-01-10 DIAGNOSIS — Z124 Encounter for screening for malignant neoplasm of cervix: Secondary | ICD-10-CM | POA: Diagnosis not present

## 2019-01-24 DIAGNOSIS — B372 Candidiasis of skin and nail: Secondary | ICD-10-CM | POA: Diagnosis not present

## 2019-02-01 ENCOUNTER — Ambulatory Visit: Payer: Medicare Other | Admitting: Podiatry

## 2019-02-07 DIAGNOSIS — C672 Malignant neoplasm of lateral wall of bladder: Secondary | ICD-10-CM | POA: Diagnosis not present

## 2019-02-13 DIAGNOSIS — N3941 Urge incontinence: Secondary | ICD-10-CM | POA: Diagnosis not present

## 2019-02-13 DIAGNOSIS — Z23 Encounter for immunization: Secondary | ICD-10-CM | POA: Diagnosis not present

## 2019-02-13 DIAGNOSIS — M6289 Other specified disorders of muscle: Secondary | ICD-10-CM | POA: Diagnosis not present

## 2019-02-13 DIAGNOSIS — M62838 Other muscle spasm: Secondary | ICD-10-CM | POA: Diagnosis not present

## 2019-02-13 DIAGNOSIS — R3915 Urgency of urination: Secondary | ICD-10-CM | POA: Diagnosis not present

## 2019-02-13 DIAGNOSIS — M6281 Muscle weakness (generalized): Secondary | ICD-10-CM | POA: Diagnosis not present

## 2019-04-04 DIAGNOSIS — M79672 Pain in left foot: Secondary | ICD-10-CM | POA: Diagnosis not present

## 2019-04-04 DIAGNOSIS — S99922A Unspecified injury of left foot, initial encounter: Secondary | ICD-10-CM | POA: Diagnosis not present

## 2019-04-04 DIAGNOSIS — S99912A Unspecified injury of left ankle, initial encounter: Secondary | ICD-10-CM | POA: Diagnosis not present

## 2019-04-04 DIAGNOSIS — M25572 Pain in left ankle and joints of left foot: Secondary | ICD-10-CM | POA: Diagnosis not present

## 2019-04-19 ENCOUNTER — Ambulatory Visit: Payer: Medicare Other | Admitting: Podiatry

## 2019-04-21 ENCOUNTER — Emergency Department (HOSPITAL_COMMUNITY): Payer: Medicare Other

## 2019-04-21 ENCOUNTER — Other Ambulatory Visit: Payer: Self-pay

## 2019-04-21 ENCOUNTER — Emergency Department (HOSPITAL_COMMUNITY)
Admission: EM | Admit: 2019-04-21 | Discharge: 2019-04-22 | Disposition: A | Discharge status: Home / Self Care | Payer: Medicare Other | Attending: Emergency Medicine | Admitting: Emergency Medicine

## 2019-04-21 DIAGNOSIS — Y92009 Unspecified place in unspecified non-institutional (private) residence as the place of occurrence of the external cause: Secondary | ICD-10-CM | POA: Diagnosis not present

## 2019-04-21 DIAGNOSIS — S52612G Displaced fracture of left ulna styloid process, subsequent encounter for closed fracture with delayed healing: Secondary | ICD-10-CM | POA: Diagnosis not present

## 2019-04-21 DIAGNOSIS — I10 Essential (primary) hypertension: Secondary | ICD-10-CM | POA: Diagnosis not present

## 2019-04-21 DIAGNOSIS — M5489 Other dorsalgia: Secondary | ICD-10-CM | POA: Diagnosis not present

## 2019-04-21 DIAGNOSIS — Z7982 Long term (current) use of aspirin: Secondary | ICD-10-CM | POA: Insufficient documentation

## 2019-04-21 DIAGNOSIS — Z79899 Other long term (current) drug therapy: Secondary | ICD-10-CM | POA: Diagnosis not present

## 2019-04-21 DIAGNOSIS — W19XXXA Unspecified fall, initial encounter: Secondary | ICD-10-CM | POA: Diagnosis not present

## 2019-04-21 DIAGNOSIS — Y999 Unspecified external cause status: Secondary | ICD-10-CM | POA: Insufficient documentation

## 2019-04-21 DIAGNOSIS — S6992XA Unspecified injury of left wrist, hand and finger(s), initial encounter: Secondary | ICD-10-CM | POA: Diagnosis present

## 2019-04-21 DIAGNOSIS — M545 Low back pain: Secondary | ICD-10-CM | POA: Insufficient documentation

## 2019-04-21 DIAGNOSIS — Z87891 Personal history of nicotine dependence: Secondary | ICD-10-CM | POA: Insufficient documentation

## 2019-04-21 DIAGNOSIS — J449 Chronic obstructive pulmonary disease, unspecified: Secondary | ICD-10-CM | POA: Diagnosis not present

## 2019-04-21 DIAGNOSIS — Z743 Need for continuous supervision: Secondary | ICD-10-CM | POA: Diagnosis not present

## 2019-04-21 DIAGNOSIS — R2689 Other abnormalities of gait and mobility: Secondary | ICD-10-CM | POA: Diagnosis not present

## 2019-04-21 DIAGNOSIS — S52612D Displaced fracture of left ulna styloid process, subsequent encounter for closed fracture with routine healing: Secondary | ICD-10-CM | POA: Diagnosis not present

## 2019-04-21 DIAGNOSIS — Y939 Activity, unspecified: Secondary | ICD-10-CM | POA: Insufficient documentation

## 2019-04-21 DIAGNOSIS — Z853 Personal history of malignant neoplasm of breast: Secondary | ICD-10-CM | POA: Diagnosis not present

## 2019-04-21 NOTE — ED Triage Notes (Signed)
Pt presents to ED via [facility] coming from home, pt states she lost her balance and fell, no LOC and no blood thinners C/o mid-back pain, EMS has a c-collar on. Pt states she had to scoot across her carpet for half an hour before she could get help. VS WNL, CA&Ox4 and ambulatory.

## 2019-04-22 ENCOUNTER — Encounter (HOSPITAL_COMMUNITY): Payer: Self-pay | Admitting: Emergency Medicine

## 2019-04-22 ENCOUNTER — Emergency Department (HOSPITAL_COMMUNITY): Payer: Medicare Other

## 2019-04-22 DIAGNOSIS — I10 Essential (primary) hypertension: Secondary | ICD-10-CM | POA: Diagnosis not present

## 2019-04-22 DIAGNOSIS — W19XXXA Unspecified fall, initial encounter: Secondary | ICD-10-CM | POA: Diagnosis not present

## 2019-04-22 DIAGNOSIS — M2550 Pain in unspecified joint: Secondary | ICD-10-CM | POA: Diagnosis not present

## 2019-04-22 DIAGNOSIS — Z7401 Bed confinement status: Secondary | ICD-10-CM | POA: Diagnosis not present

## 2019-04-22 DIAGNOSIS — S52612G Displaced fracture of left ulna styloid process, subsequent encounter for closed fracture with delayed healing: Secondary | ICD-10-CM | POA: Diagnosis not present

## 2019-04-22 MED ORDER — ACETAMINOPHEN 500 MG PO TABS
1000.0000 mg | ORAL_TABLET | Freq: Once | ORAL | Status: AC
  Administered 2019-04-22: 02:00:00 1000 mg via ORAL
  Filled 2019-04-22: qty 2

## 2019-04-22 MED ORDER — IBUPROFEN 800 MG PO TABS
800.0000 mg | ORAL_TABLET | Freq: Once | ORAL | Status: DC
  Filled 2019-04-22: qty 1

## 2019-04-22 MED ORDER — LIDOCAINE 5 % EX PTCH
2.0000 | MEDICATED_PATCH | CUTANEOUS | Status: DC
  Administered 2019-04-22: 2 via TRANSDERMAL
  Filled 2019-04-22: qty 2

## 2019-04-22 NOTE — ED Notes (Signed)
[facility] contacted for transport to bring patient home. Paperwork filled out and printed.

## 2019-04-22 NOTE — ED Notes (Signed)
Pt transported to Xray. 

## 2019-04-22 NOTE — ED Provider Notes (Signed)
[facility] DEPT Provider Note   CSN: [id number] Arrival date & time: 04/21/19  2322     History Chief Complaint  Patient presents with  . Fall    Darlene [name] is a 84 y.o. female.  The history is provided by the patient.  Fall This is a new problem. The current episode started less than 1 hour ago. The problem occurs constantly. The problem has not changed since onset.Pertinent negatives include no chest pain, no abdominal pain, no headaches and no shortness of breath. Nothing aggravates the symptoms. Nothing relieves the symptoms. She has tried nothing for the symptoms. The treatment provided no relief.  patient lost balance and fell going from hard wood to carpet and fell backward.  Now has left hand and right knee pain.  No f/c/r.  No CP no SOB, no weakness nor numbness no changed in vision or speech.      Past Medical History:  Diagnosis Date  . Anxiety   . Breast cancer, stage 1, estrogen receptor negative ([place]) 07/06/2011  . Cancer of breast ([place])   . Cataract   . Chronic ITP (idiopathic thrombocytopenia) (HCC) 07/06/2011  . Constipation    takes Colace daily  . COPD (chronic obstructive pulmonary disease) ([facility])    mild  . Depression    "nervous break down age 67"  . Fast heart beat   . GERD (gastroesophageal reflux disease)    takes Nexium daily  . History of colon polyps   . History of depression    received shock treatments   . History of palpitations    takes Metoprolol daily  . History of vertigo   . HOH (hard of hearing)    has hearing aids  . Hyperlipidemia    takes Lovastatin daily  . Hypertension   . Hypothyroid 07/06/2011   takes SYnthroid daily  . Joint pain   . Joint swelling   . Lymphoma ([place])   . Lymphoma ([place])    B cell   . Lymphoma of lymph nodes of head, face, and/or neck ([place]) 07/06/2011  . Neuropathy   . Nocturia   . Osteopenia   . Osteoporosis    hx of and take Reclast  . Peripheral neuropathy   .  Personal history of chemotherapy   . Personal history of radiation therapy   . Right knee DJD   . Shortness of breath dyspnea    with exertion  . Thyroid disease   . Umbilical hernia     Patient Active Problem List   Diagnosis Date Noted  . Hypotension 11/15/2018  . Left shoulder pain 02/08/2018  . History of bladder cancer 02/08/2018  . Anemia, chronic disease 07/27/2017  . Weight loss 11/19/2015  . Chronic constipation 11/19/2015  . Other specified hypothyroidism 11/14/2015  . DJD (degenerative joint disease) of knee 06/19/2013  . Right knee DJD   . Hyperlipidemia   . Thyroid disease   . Chest pain 12/26/2012  . Unstable angina ([place]) 12/03/2012  . Osteoporosis 12/03/2012  . Palpitations 12/03/2012  . Dyslipidemia 12/03/2012  . History of breast cancer in female 07/06/2011  . History of non-Hodgkin's lymphoma 07/06/2011  . Hypothyroid 07/06/2011  . Chronic ITP (idiopathic thrombocytopenia) (HCC) 07/06/2011    Past Surgical History:  Procedure Laterality Date  . ADENOIDECTOMY     as a child  . BREAST LUMPECTOMY Right 1994  . BREAST SURGERY Right   . BUNIONECTOMY Right   . CARDIAC CATHETERIZATION  2014  .  COLONOSCOPY    . FOOT SURGERY Right   . FRACTURE SURGERY     wrist right , right knee  . INSERTION OF MESH N/A 04/17/2015   Procedure: INSERTION OF MESH;  Surgeon: Ralene Ok, MD;  Location: [facility];  Service: General;  Laterality: N/A;  . KNEE SURGERY    . LEFT HEART CATHETERIZATION WITH CORONARY ANGIOGRAM N/A 12/05/2012   Procedure: LEFT HEART CATHETERIZATION WITH CORONARY ANGIOGRAM;  Surgeon: [name] Sine, MD;  Location: [facility] CATH LAB;  Service: Cardiovascular;  Laterality: N/A;  . right  breast surgery     d/t breast cancer  . TONSILLECTOMY     as a child  . TOTAL KNEE ARTHROPLASTY Right 06/19/2013   Procedure: TOTAL KNEE ARTHROPLASTY;  Surgeon: Lorn Junes, MD;  Location: [facility];  Service: Orthopedics;  Laterality: Right;  . TRANSURETHRAL RESECTION OF  BLADDER TUMOR WITH GYRUS (TURBT-GYRUS)     01-26-18 Dr. Louis Meckel  . TRANSURETHRAL RESECTION OF BLADDER TUMOR WITH MITOMYCIN-C Bilateral 01/26/2018   Procedure: TRANSURETHRAL RESECTION OF BLADDER TUMOR WITH GEMCITABIN BLLATERAL RETROGRADE PYELOGRAM;  Surgeon: Ardis Hughs, MD;  Location: [facility];  Service: Urology;  Laterality: Bilateral;  . TURBINATE REDUCTION    . UMBILICAL HERNIA REPAIR N/A 04/17/2015   Procedure: LAPAROSCOPIC UMBILICAL HERNIA REPAIR WITH MESH;  Surgeon: Ralene Ok, MD;  Location: [facility];  Service: General;  Laterality: N/A;  . UPPER GASTROINTESTINAL ENDOSCOPY       OB History   No obstetric history on file.     Family History  Problem Relation Age of Onset  . Hyperlipidemia Father   . Heart disease Father   . Heart disease Sister   . Cancer Brother   . Diabetes Son   . Heart disease Maternal Grandfather   . Colon cancer Neg Hx   . Breast cancer Neg Hx     Social History   Tobacco Use  . Smoking status: Former Smoker    Packs/day: 0.30    Types: Cigarettes  . Smokeless tobacco: Never Used  . Tobacco comment: quit smoking 1985  was a casual smoker  Substance Use Topics  . Alcohol use: No    Alcohol/week: 0.0 standard drinks  . Drug use: No    Home Medications Prior to Admission medications   Medication Sig Start Date End Date Taking? Authorizing Provider  acetaminophen (TYLENOL) 500 MG tablet Take 1 tablet (500 mg total) by mouth every 6 (six) hours as needed for mild pain. Patient taking differently: Take 1,000 mg by mouth every 8 (eight) hours as needed for mild pain.  04/19/16   Quintella Reichert, MD  alclomethasone (ACLOVATE) 0.05 % cream  06/10/18   [provider]  amoxicillin (AMOXIL) 500 MG capsule Take 2,000 mg by mouth once as needed (One hour prior to dental appointment).     [provider]  aspirin 81 MG tablet Take 81 mg by mouth daily.    [provider]  augmented betamethasone dipropionate (DIPROLENE-AF)  0.05 % ointment Apply 1 application topically daily as needed (itching).     [provider]  buPROPion (WELLBUTRIN XL) 150 MG 24 hr tablet Take 150 mg by mouth daily.     [provider]  Calcium Carbonate-Vit D-Min (CALTRATE 600+D PLUS MINERALS) 600-800 MG-UNIT CHEW Chew 2 each by mouth 2 (two) times daily.    [provider]  cholecalciferol (VITAMIN D) 1000 UNITS tablet Take 1,000 Units by mouth daily.    [provider]  citalopram (  CELEXA) 40 MG tablet Take 40 mg by mouth daily.     [provider]  Cyanocobalamin (VITAMIN B-12 PO) Take 1,000 mcg by mouth daily.    [provider]  Difluprednate (DUREZOL) 0.05 % EMUL Place 2 drops into the right eye 2 (two) times daily.     [provider]  ergocalciferol (VITAMIN D2) 1.25 MG (50000 UT) capsule  06/18/18   [provider]  esomeprazole (NEXIUM) 40 MG capsule Take 40 mg by mouth 2 (two) times daily before a meal.     [provider]  famciclovir [facility]) 125 MG tablet  08/15/18   [provider]  L-Methylfolate-Algae-B12-B6 [facility]) 3-90.314-2-35 MG CAPS  09/14/18   [provider]  levothyroxine (SYNTHROID, LEVOTHROID) 75 MCG tablet Take 75 mcg by mouth daily before breakfast.     [provider]  lovastatin (MEVACOR) 20 MG tablet Take 20 mg by mouth daily.     [provider]  magnesium oxide (MAG-OX) 400 MG tablet Take 400 mg by mouth daily.    [provider]  metoprolol succinate (TOPROL-XL) 25 MG 24 hr tablet Take 25 mg by mouth daily.     [provider]  Multiple Vitamins-Minerals (MULTIVITAMINS THER. W/MINERALS) TABS Take 1 tablet by mouth daily.      [provider]  MYRBETRIQ 25 MG TB24 tablet  05/25/18   [provider]  nystatin-triamcinolone ointment (MYCOLOG) Apply 1 application topically daily as needed (itching).     [provider]  Polyethyl Glycol-Propyl Glycol  (SYSTANE) 0.4-0.3 % SOLN Apply 2 drops to eye 2 (two) times daily as needed (for dry eyes).    [provider]  pregabalin (LYRICA) 50 MG capsule  10/14/18   [provider]    Allergies    Oxycodone, Codeine, Doxycycline, Meperidine and related, Sulfa antibiotics, and Sulfacetamide sodium  Review of Systems   Review of Systems  Constitutional: Negative for diaphoresis and fever.  HENT: Negative for congestion.   Eyes: Negative for visual disturbance.  Respiratory: Negative for cough and shortness of breath.   Cardiovascular: Negative for chest pain.  Gastrointestinal: Negative for abdominal pain.  Genitourinary: Negative for difficulty urinating.  Musculoskeletal: Positive for arthralgias.  Neurological: Negative for dizziness, speech difficulty, weakness, light-headedness, numbness and headaches.  Psychiatric/Behavioral: Negative for agitation.  All other systems reviewed and are negative.   Physical Exam Updated Vital Signs BP 133/76   Pulse 69   Temp 97.8 F (36.6 C) (Oral)   Resp 18   SpO2 94%   Physical Exam Vitals and nursing note reviewed.  Constitutional:      Appearance: Normal appearance. She is not diaphoretic.  HENT:     Head: Normocephalic and atraumatic.     Nose: Nose normal.  Eyes:     Conjunctiva/sclera: Conjunctivae normal.     Pupils: Pupils are equal, round, and reactive to light.  Cardiovascular:     Rate and Rhythm: Normal rate and regular rhythm.     Pulses: Normal pulses.     Heart sounds: Normal heart sounds.  Pulmonary:     Effort: Pulmonary effort is normal.  Abdominal:     General: Abdomen is flat. Bowel sounds are normal.     Tenderness: There is no abdominal tenderness. There is no guarding.  Musculoskeletal:        General: Normal range of motion.     Right shoulder: Normal.     Left shoulder: Normal.     Right upper  arm: Normal.     Left upper arm: Normal.     Right elbow: Normal.     Left elbow: Normal.      Right forearm: Normal.     Left forearm: Normal.     Right wrist: Normal. No snuff box tenderness.     Left wrist: No swelling, deformity, effusion, lacerations or snuff box tenderness. Normal range of motion.     Right hand: Normal. No tenderness or bony tenderness. Normal range of motion.     Left hand: Normal. No tenderness or bony tenderness. Normal range of motion.     Cervical back: Normal, normal range of motion and neck supple.     Thoracic back: Normal.     Lumbar back: Normal.     Right hip: Normal.     Left hip: Normal.     Right upper leg: Normal.     Left upper leg: Normal.     Right knee: Normal. No LCL laxity, MCL laxity or ACL laxity.     Left knee: Normal. No LCL laxity, MCL laxity or ACL laxity.    Right lower leg: Normal.     Left lower leg: Normal.     Right ankle: Normal.     Right Achilles Tendon: Normal.     Left ankle: Normal.     Left Achilles Tendon: Normal.     Right foot: Normal.     Left foot: Normal.  Skin:    General: Skin is warm and dry.     Capillary Refill: Capillary refill takes less than 2 seconds.  Neurological:     General: No focal deficit present.     Mental Status: She is alert and oriented to person, place, and time.     Deep Tendon Reflexes: Reflexes normal.  Psychiatric:        Mood and Affect: Mood normal.        Behavior: Behavior normal.     ED Results / Procedures / Treatments   Labs (all labs ordered are listed, but only abnormal results are displayed) Labs Reviewed - No data to display  EKG None  Radiology DG Chest 2 View  Result Date: 04/22/2019 CLINICAL DATA:  Pain status post fall EXAM: CHEST - 2 VIEW COMPARISON:  12/02/2012 FINDINGS: The heart size is enlarged. Aortic calcifications are noted. There is no pneumothorax or large pleural effusion. Advanced degenerative changes are noted of the bilateral glenohumeral joints. IMPRESSION: Cardiomegaly without edema or pleural effusion. Electronically Signed   By:  Constance Holster M.D.   On: 04/22/2019 00:38   CT Head Wo Contrast  Result Date: 04/22/2019 CLINICAL DATA:  Lost balance leading to fall. No loss of consciousness EXAM: CT HEAD WITHOUT CONTRAST TECHNIQUE: Contiguous axial images were obtained from the base of the skull through the vertex without intravenous contrast. COMPARISON:  Head CT 11/28/2018 FINDINGS: Brain: No intracranial hemorrhage, mass effect, or midline shift. Unchanged age related atrophy. No hydrocephalus. The basilar cisterns are patent. Unchanged periventricular chronic small vessel ischemia. No evidence of territorial infarct or acute ischemia. No extra-axial or intracranial fluid collection. Vascular: Atherosclerosis of skullbase vasculature without hyperdense vessel or abnormal calcification. Skull: No fracture or focal lesion. Sinuses/Orbits: Chronic right ethmoid osteoma. Chronic opacification of left side of sphenoid sinus. No acute findings. Mastoid air cells are clear. Bilateral cataract Other: None. IMPRESSION: 1.  No acute intracranial abnormality.  No skull fracture. 2. Stable atrophy and chronic small vessel ischemia. Electronically Signed   By: Threasa Beards  [place] M.D.   On: 04/22/2019 00:25   CT Cervical Spine Wo Contrast  Result Date: 04/22/2019 CLINICAL DATA:  Lost balance leading to fall. EXAM: CT CERVICAL SPINE WITHOUT CONTRAST TECHNIQUE: Multidetector CT imaging of the cervical spine was performed without intravenous contrast. Multiplanar CT image reconstructions were also generated. COMPARISON:  None. FINDINGS: Alignment: Anterolisthesis of C3 on C4 appears degenerative and facet mediated. There are no jumped or perched facets. Skull base and vertebrae: No acute fracture. Vertebral body heights are maintained. The dens and skull base are intact. Soft tissues and spinal canal: No prevertebral fluid or swelling. No visible canal hematoma. Disc levels: Near complete disc space loss at C4-C5. Prominent disc space narrowing  with subchondral sclerosis and irregularity. Additional disc space narrowing and endplate spurring throughout the remainder of cervical spine. Advanced facet hypertrophy with bony ankylosis of C4-C5 on the left. Upper chest: No acute findings. Remote medial left clavicle fracture with nonunion. Other: None. IMPRESSION: 1. No acute fracture or subluxation of the cervical spine. 2. Advanced multilevel degenerative disc disease and facet hypertrophy. Anterolisthesis of C3 on C4 is likely degenerative and facet mediated. Electronically Signed   By: Keith Rake M.D.   On: 04/22/2019 00:29   DG Knee Complete 4 Views Right  Result Date: 04/22/2019 CLINICAL DATA:  Lost balance leading to fall.  Right knee pain. EXAM: RIGHT KNEE - COMPLETE 4+ VIEW COMPARISON:  None. FINDINGS: Right knee arthroplasty in expected alignment. No acute or periprosthetic fracture. There has been patellar resurfacing. Trace joint effusion. No focal soft tissue abnormality. IMPRESSION: Right knee arthroplasty without complication. No acute or periprosthetic fracture. Electronically Signed   By: Keith Rake M.D.   On: 04/22/2019 01:30   DG Hand Complete Left  Result Date: 04/22/2019 CLINICAL DATA:  Lost balance leading to fall.  Left hand pain. EXAM: LEFT HAND - COMPLETE 3+ VIEW COMPARISON:  Radiograph 09/16/2012 FINDINGS: Suspect tiny acute fracture from the ulna styloid. No additional fracture of the hand are distal radius. Multifocal osteoarthritis, progressed from prior exam. Arthritic changes most prominent at the thumb carpal metacarpal joint. Progressive degenerative cystic changes throughout the carpal bones. IMPRESSION: 1. Suspected tiny acute fracture from the ulna styloid, recommend correlation with point tenderness. 2. Multifocal osteoarthritis, progressed from 2014. Electronically Signed   By: Keith Rake M.D.   On: 04/22/2019 01:33   DG Hip Unilat W or Wo Pelvis 2-3 Views Right  Result Date:  04/22/2019 CLINICAL DATA:  Pain EXAM: DG HIP (WITH OR WITHOUT PELVIS) 2-3V RIGHT COMPARISON:  None. FINDINGS: Evaluation is limited by patient body habitus. There is no acute displaced fracture or dislocation. There is moderate bilateral hip osteoarthritis. IMPRESSION: 1. No acute displaced fracture or dislocation. 2. Moderate bilateral hip osteoarthritis. Electronically Signed   By: Constance Holster M.D.   On: 04/22/2019 00:39    Procedures Procedures (including critical care time)  Medications Ordered in ED Medications  acetaminophen (TYLENOL) tablet 1,000 mg (has no administration in time range)  ibuprofen (ADVIL) tablet 800 mg (has no administration in time range)    ED Course  I have reviewed the triage vital signs and the nursing notes.  Pertinent labs & imaging results that were available during my care of the patient were reviewed by me and considered in my medical decision making (see chart for details).  Placed in an ulnar gutter splint and will have patient follow up with hand surgery regarding questioned ulnar styloid fracture.    Final Clinical Impression(s) / ED  Diagnoses  Return for intractable cough, coughing up blood,fevers >100.4 unrelieved by medication, shortness of breath, intractable vomiting, chest pain, shortness of breath, weakness,numbness, changes in speech, facial asymmetry,abdominal pain, passing out,Inability to tolerate liquids or food, cough, altered mental status or any concerns. No signs of systemic illness or infection. The patient is nontoxic-appearing on exam and vital signs are within normal limits.   I have reviewed the triage vital signs and the nursing notes. Pertinent labs &imaging results that were available during my care of the patient were reviewed by me and considered in my medical decision making (see chart for details).  After history, exam, and medical workup I feel the patient has been appropriately medically screened and is  safe for discharge home. Pertinent diagnoses were discussed with the patient. Patient was given return   Drezden Seitzinger, MD 04/22/19 [id number]

## 2019-04-22 NOTE — ED Notes (Signed)
Ortho Tech called regarding Ulnar Gutter Splint

## 2019-04-22 NOTE — ED Notes (Signed)
Ortho Tech at Bedside

## 2019-04-22 NOTE — Progress Notes (Signed)
Orthopedic Tech Progress Note Patient Details:  Darlene [name] 03/18/1935 [id number]  Ortho Devices Type of Ortho Device: Ulna gutter splint Ortho Device/Splint Location: lue Ortho Device/Splint Interventions: Ordered, Application, Adjustment   Post Interventions Patient Tolerated: Well Instructions Provided: Care of device, Adjustment of device   Karolee Stamps 04/22/2019, 3:33 AM

## 2019-05-11 DIAGNOSIS — G500 Trigeminal neuralgia: Secondary | ICD-10-CM | POA: Diagnosis not present

## 2019-05-11 DIAGNOSIS — E039 Hypothyroidism, unspecified: Secondary | ICD-10-CM | POA: Diagnosis not present

## 2019-05-11 DIAGNOSIS — R2681 Unsteadiness on feet: Secondary | ICD-10-CM | POA: Diagnosis not present

## 2019-05-11 DIAGNOSIS — C8510 Unspecified B-cell lymphoma, unspecified site: Secondary | ICD-10-CM | POA: Diagnosis not present

## 2019-05-11 DIAGNOSIS — J449 Chronic obstructive pulmonary disease, unspecified: Secondary | ICD-10-CM | POA: Diagnosis not present

## 2019-05-11 DIAGNOSIS — G8911 Acute pain due to trauma: Secondary | ICD-10-CM | POA: Diagnosis not present

## 2019-05-11 DIAGNOSIS — M62830 Muscle spasm of back: Secondary | ICD-10-CM | POA: Diagnosis not present

## 2019-05-11 DIAGNOSIS — W19XXXD Unspecified fall, subsequent encounter: Secondary | ICD-10-CM | POA: Diagnosis not present

## 2019-05-11 DIAGNOSIS — Z7982 Long term (current) use of aspirin: Secondary | ICD-10-CM | POA: Diagnosis not present

## 2019-05-11 DIAGNOSIS — Z96651 Presence of right artificial knee joint: Secondary | ICD-10-CM | POA: Diagnosis not present

## 2019-05-11 DIAGNOSIS — F329 Major depressive disorder, single episode, unspecified: Secondary | ICD-10-CM | POA: Diagnosis not present

## 2019-05-11 DIAGNOSIS — M47812 Spondylosis without myelopathy or radiculopathy, cervical region: Secondary | ICD-10-CM | POA: Diagnosis not present

## 2019-05-11 DIAGNOSIS — K219 Gastro-esophageal reflux disease without esophagitis: Secondary | ICD-10-CM | POA: Diagnosis not present

## 2019-05-11 DIAGNOSIS — Z9181 History of falling: Secondary | ICD-10-CM | POA: Diagnosis not present

## 2019-05-11 DIAGNOSIS — M80032D Age-related osteoporosis with current pathological fracture, left forearm, subsequent encounter for fracture with routine healing: Secondary | ICD-10-CM | POA: Diagnosis not present

## 2019-05-16 DIAGNOSIS — W19XXXD Unspecified fall, subsequent encounter: Secondary | ICD-10-CM | POA: Diagnosis not present

## 2019-05-16 DIAGNOSIS — M80032D Age-related osteoporosis with current pathological fracture, left forearm, subsequent encounter for fracture with routine healing: Secondary | ICD-10-CM | POA: Diagnosis not present

## 2019-05-16 DIAGNOSIS — G8911 Acute pain due to trauma: Secondary | ICD-10-CM | POA: Diagnosis not present

## 2019-05-16 DIAGNOSIS — M62830 Muscle spasm of back: Secondary | ICD-10-CM | POA: Diagnosis not present

## 2019-05-16 DIAGNOSIS — R2681 Unsteadiness on feet: Secondary | ICD-10-CM | POA: Diagnosis not present

## 2019-05-16 DIAGNOSIS — M47812 Spondylosis without myelopathy or radiculopathy, cervical region: Secondary | ICD-10-CM | POA: Diagnosis not present

## 2019-05-18 DIAGNOSIS — M5416 Radiculopathy, lumbar region: Secondary | ICD-10-CM | POA: Insufficient documentation

## 2019-05-18 DIAGNOSIS — M6281 Muscle weakness (generalized): Secondary | ICD-10-CM | POA: Insufficient documentation

## 2019-05-23 DIAGNOSIS — W19XXXD Unspecified fall, subsequent encounter: Secondary | ICD-10-CM | POA: Diagnosis not present

## 2019-05-23 DIAGNOSIS — M62830 Muscle spasm of back: Secondary | ICD-10-CM | POA: Diagnosis not present

## 2019-05-23 DIAGNOSIS — R2681 Unsteadiness on feet: Secondary | ICD-10-CM | POA: Diagnosis not present

## 2019-05-23 DIAGNOSIS — M80032D Age-related osteoporosis with current pathological fracture, left forearm, subsequent encounter for fracture with routine healing: Secondary | ICD-10-CM | POA: Diagnosis not present

## 2019-05-23 DIAGNOSIS — G8911 Acute pain due to trauma: Secondary | ICD-10-CM | POA: Diagnosis not present

## 2019-05-23 DIAGNOSIS — M47812 Spondylosis without myelopathy or radiculopathy, cervical region: Secondary | ICD-10-CM | POA: Diagnosis not present

## 2019-05-26 DIAGNOSIS — G8911 Acute pain due to trauma: Secondary | ICD-10-CM | POA: Diagnosis not present

## 2019-05-26 DIAGNOSIS — W19XXXD Unspecified fall, subsequent encounter: Secondary | ICD-10-CM | POA: Diagnosis not present

## 2019-05-26 DIAGNOSIS — M47812 Spondylosis without myelopathy or radiculopathy, cervical region: Secondary | ICD-10-CM | POA: Diagnosis not present

## 2019-05-26 DIAGNOSIS — R2681 Unsteadiness on feet: Secondary | ICD-10-CM | POA: Diagnosis not present

## 2019-05-26 DIAGNOSIS — M80032D Age-related osteoporosis with current pathological fracture, left forearm, subsequent encounter for fracture with routine healing: Secondary | ICD-10-CM | POA: Diagnosis not present

## 2019-05-26 DIAGNOSIS — M62830 Muscle spasm of back: Secondary | ICD-10-CM | POA: Diagnosis not present

## 2019-05-29 DIAGNOSIS — M47812 Spondylosis without myelopathy or radiculopathy, cervical region: Secondary | ICD-10-CM | POA: Diagnosis not present

## 2019-05-29 DIAGNOSIS — R2681 Unsteadiness on feet: Secondary | ICD-10-CM | POA: Diagnosis not present

## 2019-05-29 DIAGNOSIS — M62830 Muscle spasm of back: Secondary | ICD-10-CM | POA: Diagnosis not present

## 2019-05-29 DIAGNOSIS — M80032D Age-related osteoporosis with current pathological fracture, left forearm, subsequent encounter for fracture with routine healing: Secondary | ICD-10-CM | POA: Diagnosis not present

## 2019-05-29 DIAGNOSIS — G8911 Acute pain due to trauma: Secondary | ICD-10-CM | POA: Diagnosis not present

## 2019-05-29 DIAGNOSIS — W19XXXD Unspecified fall, subsequent encounter: Secondary | ICD-10-CM | POA: Diagnosis not present

## 2019-05-30 ENCOUNTER — Other Ambulatory Visit: Payer: Self-pay | Admitting: Chiropractic Medicine

## 2019-05-30 DIAGNOSIS — R2681 Unsteadiness on feet: Secondary | ICD-10-CM | POA: Diagnosis not present

## 2019-05-30 DIAGNOSIS — G500 Trigeminal neuralgia: Secondary | ICD-10-CM | POA: Diagnosis not present

## 2019-05-30 DIAGNOSIS — M47812 Spondylosis without myelopathy or radiculopathy, cervical region: Secondary | ICD-10-CM | POA: Diagnosis not present

## 2019-05-30 DIAGNOSIS — G8911 Acute pain due to trauma: Secondary | ICD-10-CM | POA: Diagnosis not present

## 2019-05-30 DIAGNOSIS — M5416 Radiculopathy, lumbar region: Secondary | ICD-10-CM

## 2019-05-31 ENCOUNTER — Encounter: Payer: Self-pay | Admitting: Podiatry

## 2019-05-31 ENCOUNTER — Ambulatory Visit (INDEPENDENT_AMBULATORY_CARE_PROVIDER_SITE_OTHER): Payer: Medicare Other | Admitting: Podiatry

## 2019-05-31 ENCOUNTER — Other Ambulatory Visit: Payer: Self-pay

## 2019-05-31 DIAGNOSIS — B351 Tinea unguium: Secondary | ICD-10-CM

## 2019-05-31 DIAGNOSIS — M79676 Pain in unspecified toe(s): Secondary | ICD-10-CM | POA: Diagnosis not present

## 2019-05-31 NOTE — Progress Notes (Signed)
Patient ID: LAYTONA HJORTH, female   DOB: [birth year], 85 y.o.   MRN: [id number] Complaint:  Visit Type: Patient returns to my office for continued preventative foot care services. Complaint: Patient states" my nails have grown long and thick and become painful to walk and wear shoes".  He presents for preventative foot care services. No changes to ROS.  Patient presents with her daughter.  Podiatric Exam: Vascular: dorsalis pedis and posterior tibial pulses are palpable bilateral. Capillary return is immediate. Temperature gradient is WNL. Skin turgor WNL  Sensorium: Normal Semmes Weinstein monofilament test. Normal tactile sensation bilaterally. Nail Exam: Pt has thick disfigured discolored nails with subungual debris noted bilateral entire nail hallux through fifth toenails Ulcer Exam: There is no evidence of ulcer or pre-ulcerative changes or infection. Orthopedic Exam: Muscle tone and strength are WNL. No limitations in general ROM. No crepitus or effusions noted. Hallux varus 1st MPJ right with hammer toes2,3 right. Bony prominences are unremarkable. Asymptomatic adducto varus fifth digits. Skin: No Porokeratosis. No infection or ulcers.  Diagnosis:  Tinea unguium, Pain in right toe, pain in left toes  Treatment & Plan Procedures and Treatment: Consent by patient was obtained for treatment procedures. The patient understood the discussion of treatment and procedures well. All questions were answered thoroughly reviewed. Debridement of mycotic and hypertrophic toenails, 1 through 5 bilateral and clearing of subungual debris. No ulceration, no infection noted.  Return Visit-Office Procedure: Patient instructed to return to the office for a follow up visit 12 weeks  for continued evaluation and treatment.

## 2019-06-01 DIAGNOSIS — M62830 Muscle spasm of back: Secondary | ICD-10-CM | POA: Diagnosis not present

## 2019-06-01 DIAGNOSIS — M80032D Age-related osteoporosis with current pathological fracture, left forearm, subsequent encounter for fracture with routine healing: Secondary | ICD-10-CM | POA: Diagnosis not present

## 2019-06-01 DIAGNOSIS — R2681 Unsteadiness on feet: Secondary | ICD-10-CM | POA: Diagnosis not present

## 2019-06-01 DIAGNOSIS — M47812 Spondylosis without myelopathy or radiculopathy, cervical region: Secondary | ICD-10-CM | POA: Diagnosis not present

## 2019-06-01 DIAGNOSIS — G8911 Acute pain due to trauma: Secondary | ICD-10-CM | POA: Diagnosis not present

## 2019-06-01 DIAGNOSIS — W19XXXD Unspecified fall, subsequent encounter: Secondary | ICD-10-CM | POA: Diagnosis not present

## 2019-06-05 DIAGNOSIS — E039 Hypothyroidism, unspecified: Secondary | ICD-10-CM | POA: Diagnosis not present

## 2019-06-05 DIAGNOSIS — M810 Age-related osteoporosis without current pathological fracture: Secondary | ICD-10-CM | POA: Diagnosis not present

## 2019-06-05 DIAGNOSIS — E7800 Pure hypercholesterolemia, unspecified: Secondary | ICD-10-CM | POA: Diagnosis not present

## 2019-06-05 DIAGNOSIS — E785 Hyperlipidemia, unspecified: Secondary | ICD-10-CM | POA: Diagnosis not present

## 2019-06-07 DIAGNOSIS — R2681 Unsteadiness on feet: Secondary | ICD-10-CM | POA: Diagnosis not present

## 2019-06-07 DIAGNOSIS — M47812 Spondylosis without myelopathy or radiculopathy, cervical region: Secondary | ICD-10-CM | POA: Diagnosis not present

## 2019-06-07 DIAGNOSIS — M80032D Age-related osteoporosis with current pathological fracture, left forearm, subsequent encounter for fracture with routine healing: Secondary | ICD-10-CM | POA: Diagnosis not present

## 2019-06-07 DIAGNOSIS — G8911 Acute pain due to trauma: Secondary | ICD-10-CM | POA: Diagnosis not present

## 2019-06-07 DIAGNOSIS — M62830 Muscle spasm of back: Secondary | ICD-10-CM | POA: Diagnosis not present

## 2019-06-07 DIAGNOSIS — W19XXXD Unspecified fall, subsequent encounter: Secondary | ICD-10-CM | POA: Diagnosis not present

## 2019-06-08 DIAGNOSIS — W19XXXD Unspecified fall, subsequent encounter: Secondary | ICD-10-CM | POA: Diagnosis not present

## 2019-06-08 DIAGNOSIS — M62830 Muscle spasm of back: Secondary | ICD-10-CM | POA: Diagnosis not present

## 2019-06-08 DIAGNOSIS — M80032D Age-related osteoporosis with current pathological fracture, left forearm, subsequent encounter for fracture with routine healing: Secondary | ICD-10-CM | POA: Diagnosis not present

## 2019-06-08 DIAGNOSIS — M47812 Spondylosis without myelopathy or radiculopathy, cervical region: Secondary | ICD-10-CM | POA: Diagnosis not present

## 2019-06-08 DIAGNOSIS — G8911 Acute pain due to trauma: Secondary | ICD-10-CM | POA: Diagnosis not present

## 2019-06-08 DIAGNOSIS — R2681 Unsteadiness on feet: Secondary | ICD-10-CM | POA: Diagnosis not present

## 2019-06-09 ENCOUNTER — Ambulatory Visit
Admission: RE | Admit: 2019-06-09 | Discharge: 2019-06-09 | Disposition: A | Discharge status: Home / Self Care | Payer: Medicare Other | Source: Ambulatory Visit | Attending: Chiropractic Medicine | Admitting: Chiropractic Medicine

## 2019-06-09 DIAGNOSIS — M5416 Radiculopathy, lumbar region: Secondary | ICD-10-CM

## 2019-06-09 DIAGNOSIS — M48061 Spinal stenosis, lumbar region without neurogenic claudication: Secondary | ICD-10-CM | POA: Diagnosis not present

## 2019-06-09 MED ORDER — METHYLPREDNISOLONE ACETATE 40 MG/ML INJ SUSP (RADIOLOG
120.0000 mg | Freq: Once | INTRAMUSCULAR | Status: AC
  Administered 2019-06-09: 120 mg via EPIDURAL

## 2019-06-09 MED ORDER — IOPAMIDOL (ISOVUE-M 200) INJECTION 41%
1.0000 mL | Freq: Once | INTRAMUSCULAR | Status: AC
  Administered 2019-06-09: 1 mL via EPIDURAL

## 2019-06-09 NOTE — Discharge Instructions (Signed)

## 2019-06-10 DIAGNOSIS — E039 Hypothyroidism, unspecified: Secondary | ICD-10-CM | POA: Diagnosis not present

## 2019-06-10 DIAGNOSIS — W19XXXD Unspecified fall, subsequent encounter: Secondary | ICD-10-CM | POA: Diagnosis not present

## 2019-06-10 DIAGNOSIS — M80032D Age-related osteoporosis with current pathological fracture, left forearm, subsequent encounter for fracture with routine healing: Secondary | ICD-10-CM | POA: Diagnosis not present

## 2019-06-10 DIAGNOSIS — Z96651 Presence of right artificial knee joint: Secondary | ICD-10-CM | POA: Diagnosis not present

## 2019-06-10 DIAGNOSIS — G8911 Acute pain due to trauma: Secondary | ICD-10-CM | POA: Diagnosis not present

## 2019-06-10 DIAGNOSIS — M47812 Spondylosis without myelopathy or radiculopathy, cervical region: Secondary | ICD-10-CM | POA: Diagnosis not present

## 2019-06-10 DIAGNOSIS — Z7982 Long term (current) use of aspirin: Secondary | ICD-10-CM | POA: Diagnosis not present

## 2019-06-10 DIAGNOSIS — C8510 Unspecified B-cell lymphoma, unspecified site: Secondary | ICD-10-CM | POA: Diagnosis not present

## 2019-06-10 DIAGNOSIS — K219 Gastro-esophageal reflux disease without esophagitis: Secondary | ICD-10-CM | POA: Diagnosis not present

## 2019-06-10 DIAGNOSIS — Z9181 History of falling: Secondary | ICD-10-CM | POA: Diagnosis not present

## 2019-06-10 DIAGNOSIS — R2681 Unsteadiness on feet: Secondary | ICD-10-CM | POA: Diagnosis not present

## 2019-06-10 DIAGNOSIS — G500 Trigeminal neuralgia: Secondary | ICD-10-CM | POA: Diagnosis not present

## 2019-06-10 DIAGNOSIS — M62830 Muscle spasm of back: Secondary | ICD-10-CM | POA: Diagnosis not present

## 2019-06-10 DIAGNOSIS — J449 Chronic obstructive pulmonary disease, unspecified: Secondary | ICD-10-CM | POA: Diagnosis not present

## 2019-06-10 DIAGNOSIS — F329 Major depressive disorder, single episode, unspecified: Secondary | ICD-10-CM | POA: Diagnosis not present

## 2019-06-12 DIAGNOSIS — I10 Essential (primary) hypertension: Secondary | ICD-10-CM | POA: Diagnosis not present

## 2019-06-12 DIAGNOSIS — E559 Vitamin D deficiency, unspecified: Secondary | ICD-10-CM | POA: Diagnosis not present

## 2019-06-12 DIAGNOSIS — K219 Gastro-esophageal reflux disease without esophagitis: Secondary | ICD-10-CM | POA: Diagnosis not present

## 2019-06-12 DIAGNOSIS — F329 Major depressive disorder, single episode, unspecified: Secondary | ICD-10-CM | POA: Diagnosis not present

## 2019-06-12 DIAGNOSIS — E785 Hyperlipidemia, unspecified: Secondary | ICD-10-CM | POA: Diagnosis not present

## 2019-06-14 DIAGNOSIS — W19XXXD Unspecified fall, subsequent encounter: Secondary | ICD-10-CM | POA: Diagnosis not present

## 2019-06-14 DIAGNOSIS — M62830 Muscle spasm of back: Secondary | ICD-10-CM | POA: Diagnosis not present

## 2019-06-14 DIAGNOSIS — M80032D Age-related osteoporosis with current pathological fracture, left forearm, subsequent encounter for fracture with routine healing: Secondary | ICD-10-CM | POA: Diagnosis not present

## 2019-06-14 DIAGNOSIS — M47812 Spondylosis without myelopathy or radiculopathy, cervical region: Secondary | ICD-10-CM | POA: Diagnosis not present

## 2019-06-14 DIAGNOSIS — R2681 Unsteadiness on feet: Secondary | ICD-10-CM | POA: Diagnosis not present

## 2019-06-14 DIAGNOSIS — G8911 Acute pain due to trauma: Secondary | ICD-10-CM | POA: Diagnosis not present

## 2019-06-15 DIAGNOSIS — M47812 Spondylosis without myelopathy or radiculopathy, cervical region: Secondary | ICD-10-CM | POA: Diagnosis not present

## 2019-06-15 DIAGNOSIS — G8911 Acute pain due to trauma: Secondary | ICD-10-CM | POA: Diagnosis not present

## 2019-06-15 DIAGNOSIS — W19XXXD Unspecified fall, subsequent encounter: Secondary | ICD-10-CM | POA: Diagnosis not present

## 2019-06-16 DIAGNOSIS — R2681 Unsteadiness on feet: Secondary | ICD-10-CM | POA: Diagnosis not present

## 2019-06-16 DIAGNOSIS — G8911 Acute pain due to trauma: Secondary | ICD-10-CM | POA: Diagnosis not present

## 2019-06-16 DIAGNOSIS — M47812 Spondylosis without myelopathy or radiculopathy, cervical region: Secondary | ICD-10-CM | POA: Diagnosis not present

## 2019-06-16 DIAGNOSIS — M62830 Muscle spasm of back: Secondary | ICD-10-CM | POA: Diagnosis not present

## 2019-06-16 DIAGNOSIS — W19XXXD Unspecified fall, subsequent encounter: Secondary | ICD-10-CM | POA: Diagnosis not present

## 2019-06-16 DIAGNOSIS — M80032D Age-related osteoporosis with current pathological fracture, left forearm, subsequent encounter for fracture with routine healing: Secondary | ICD-10-CM | POA: Diagnosis not present

## 2019-06-20 DIAGNOSIS — M62830 Muscle spasm of back: Secondary | ICD-10-CM | POA: Diagnosis not present

## 2019-06-20 DIAGNOSIS — M80032D Age-related osteoporosis with current pathological fracture, left forearm, subsequent encounter for fracture with routine healing: Secondary | ICD-10-CM | POA: Diagnosis not present

## 2019-06-20 DIAGNOSIS — R2681 Unsteadiness on feet: Secondary | ICD-10-CM | POA: Diagnosis not present

## 2019-06-20 DIAGNOSIS — M47812 Spondylosis without myelopathy or radiculopathy, cervical region: Secondary | ICD-10-CM | POA: Diagnosis not present

## 2019-06-20 DIAGNOSIS — G8911 Acute pain due to trauma: Secondary | ICD-10-CM | POA: Diagnosis not present

## 2019-06-20 DIAGNOSIS — W19XXXD Unspecified fall, subsequent encounter: Secondary | ICD-10-CM | POA: Diagnosis not present

## 2019-06-29 DIAGNOSIS — Z23 Encounter for immunization: Secondary | ICD-10-CM | POA: Diagnosis not present

## 2019-06-30 DIAGNOSIS — R2681 Unsteadiness on feet: Secondary | ICD-10-CM | POA: Diagnosis not present

## 2019-06-30 DIAGNOSIS — W19XXXD Unspecified fall, subsequent encounter: Secondary | ICD-10-CM | POA: Diagnosis not present

## 2019-06-30 DIAGNOSIS — M47812 Spondylosis without myelopathy or radiculopathy, cervical region: Secondary | ICD-10-CM | POA: Diagnosis not present

## 2019-06-30 DIAGNOSIS — M62830 Muscle spasm of back: Secondary | ICD-10-CM | POA: Diagnosis not present

## 2019-06-30 DIAGNOSIS — G8911 Acute pain due to trauma: Secondary | ICD-10-CM | POA: Diagnosis not present

## 2019-06-30 DIAGNOSIS — M80032D Age-related osteoporosis with current pathological fracture, left forearm, subsequent encounter for fracture with routine healing: Secondary | ICD-10-CM | POA: Diagnosis not present

## 2019-07-03 DIAGNOSIS — B372 Candidiasis of skin and nail: Secondary | ICD-10-CM | POA: Diagnosis not present

## 2019-07-04 DIAGNOSIS — F329 Major depressive disorder, single episode, unspecified: Secondary | ICD-10-CM | POA: Diagnosis not present

## 2019-07-05 DIAGNOSIS — H52203 Unspecified astigmatism, bilateral: Secondary | ICD-10-CM | POA: Diagnosis not present

## 2019-07-05 DIAGNOSIS — Z961 Presence of intraocular lens: Secondary | ICD-10-CM | POA: Diagnosis not present

## 2019-07-06 DIAGNOSIS — G8911 Acute pain due to trauma: Secondary | ICD-10-CM | POA: Diagnosis not present

## 2019-07-06 DIAGNOSIS — W19XXXD Unspecified fall, subsequent encounter: Secondary | ICD-10-CM | POA: Diagnosis not present

## 2019-07-06 DIAGNOSIS — M80032D Age-related osteoporosis with current pathological fracture, left forearm, subsequent encounter for fracture with routine healing: Secondary | ICD-10-CM | POA: Diagnosis not present

## 2019-07-06 DIAGNOSIS — R2681 Unsteadiness on feet: Secondary | ICD-10-CM | POA: Diagnosis not present

## 2019-07-06 DIAGNOSIS — M62830 Muscle spasm of back: Secondary | ICD-10-CM | POA: Diagnosis not present

## 2019-07-06 DIAGNOSIS — M47812 Spondylosis without myelopathy or radiculopathy, cervical region: Secondary | ICD-10-CM | POA: Diagnosis not present

## 2019-07-14 ENCOUNTER — Other Ambulatory Visit: Payer: Self-pay | Admitting: Internal Medicine

## 2019-07-14 ENCOUNTER — Ambulatory Visit
Admission: RE | Admit: 2019-07-14 | Discharge: 2019-07-14 | Disposition: A | Discharge status: Home / Self Care | Payer: Medicare Other | Source: Ambulatory Visit | Attending: Internal Medicine | Admitting: Internal Medicine

## 2019-07-14 DIAGNOSIS — S0990XA Unspecified injury of head, initial encounter: Secondary | ICD-10-CM

## 2019-07-14 DIAGNOSIS — L03031 Cellulitis of right toe: Secondary | ICD-10-CM | POA: Diagnosis not present

## 2019-07-14 DIAGNOSIS — R296 Repeated falls: Secondary | ICD-10-CM | POA: Diagnosis not present

## 2019-07-14 DIAGNOSIS — R410 Disorientation, unspecified: Secondary | ICD-10-CM | POA: Diagnosis not present

## 2019-07-14 DIAGNOSIS — R5383 Other fatigue: Secondary | ICD-10-CM | POA: Diagnosis not present

## 2019-07-18 DIAGNOSIS — L03031 Cellulitis of right toe: Secondary | ICD-10-CM | POA: Diagnosis not present

## 2019-07-20 DIAGNOSIS — F329 Major depressive disorder, single episode, unspecified: Secondary | ICD-10-CM | POA: Diagnosis not present

## 2019-07-20 DIAGNOSIS — L03031 Cellulitis of right toe: Secondary | ICD-10-CM | POA: Diagnosis not present

## 2019-07-20 DIAGNOSIS — R296 Repeated falls: Secondary | ICD-10-CM | POA: Diagnosis not present

## 2019-07-27 DIAGNOSIS — Z23 Encounter for immunization: Secondary | ICD-10-CM | POA: Diagnosis not present

## 2019-08-17 DIAGNOSIS — L03031 Cellulitis of right toe: Secondary | ICD-10-CM | POA: Diagnosis not present

## 2019-08-30 ENCOUNTER — Encounter: Payer: Self-pay | Admitting: Podiatry

## 2019-08-30 ENCOUNTER — Other Ambulatory Visit: Payer: Self-pay

## 2019-08-30 ENCOUNTER — Ambulatory Visit (INDEPENDENT_AMBULATORY_CARE_PROVIDER_SITE_OTHER): Payer: Medicare Other | Admitting: Podiatry

## 2019-08-30 VITALS — Temp 97.9°F

## 2019-08-30 DIAGNOSIS — B351 Tinea unguium: Secondary | ICD-10-CM

## 2019-08-30 DIAGNOSIS — M79676 Pain in unspecified toe(s): Secondary | ICD-10-CM | POA: Diagnosis not present

## 2019-08-30 NOTE — Progress Notes (Signed)
This patient returns to the office for evaluation and treatment of long thick painful nails .  This patient is unable to trim his own nails since the patient cannot reach the feet.  Patient says the nails are painful walking and wearing his shoes.  He returns for preventive foot care services.  General Appearance  Alert, conversant and in no acute stress.  Vascular  Dorsalis pedis and posterior tibial  pulses are palpable  bilaterally.  Capillary return is within normal limits  bilaterally. Temperature is within normal limits  bilaterally.  Neurologic  Senn-Weinstein monofilament wire test within normal limits  bilaterally. Muscle power within normal limits bilaterally.  Nails Thick disfigured discolored nails with subungual debris  from hallux to fifth toes bilaterally. No evidence of bacterial infection or drainage bilaterally.  Orthopedic  No limitations of motion  feet .  No crepitus or effusions noted.  No bony pathology or digital deformities noted.  Hallux varus 1st MPJ and hallux malleus right foot.   Hammer toes 2,3 right.  Skin  normotropic skin with no porokeratosis noted bilaterally.  No signs of infections or ulcers noted.     Onychomycosis  Pain in toes right foot  Pain in toes left foot  Debridement  of nails  1-5  B/L with a nail nipper.  Nails were then filed using a dremel tool with no incidents.    RTC 3 months    [place] Mallozzi DPM  

## 2019-09-08 DIAGNOSIS — H5713 Ocular pain, bilateral: Secondary | ICD-10-CM | POA: Diagnosis not present

## 2019-09-25 IMAGING — MR MR LUMBAR SPINE W/O CM
4 of 5 series · 24 of 48 positions shown · non-contrast
Comparison: CT abdomen and pelvis 07/13/2017

CLINICAL DATA: Worsening chronic low back pain. Imbalance.
Hyperreflexia. History of lymphoma.

EXAM:
MRI LUMBAR SPINE WITHOUT CONTRAST
TECHNIQUE: Multiplanar, multisequence MR imaging of the lumbar spine was
performed. No intravenous contrast was administered.

[Series 2: T2 post-contrast · sagittal · 4.0mm · 0.55mm/px · 6 of 13 slices shown]
[im 1/13]
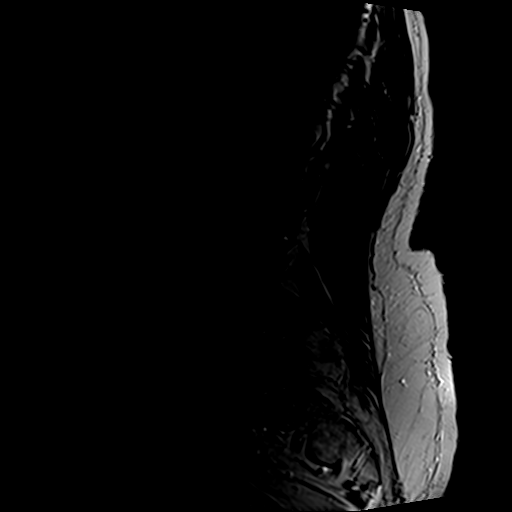
[im 3/13]
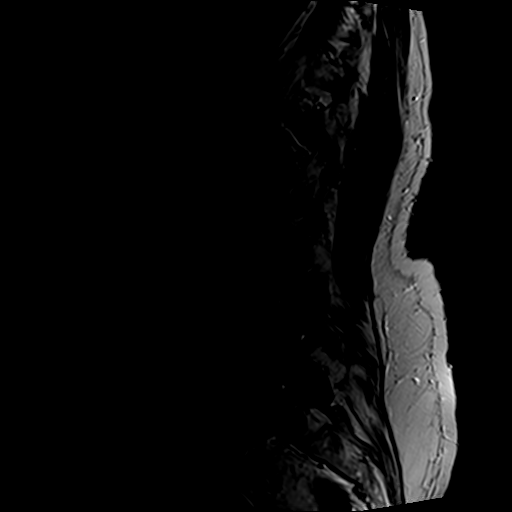
[im 5/13]
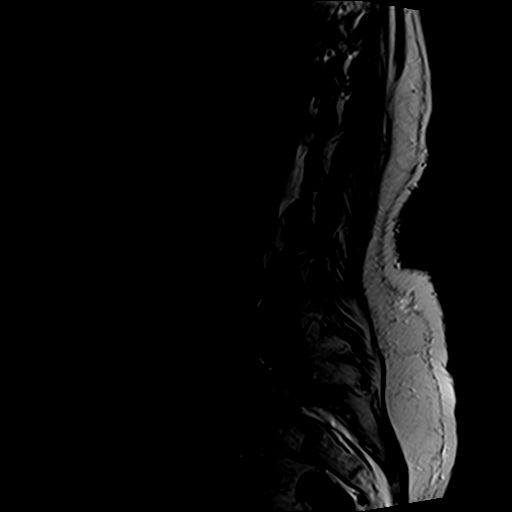
[im 8/13]
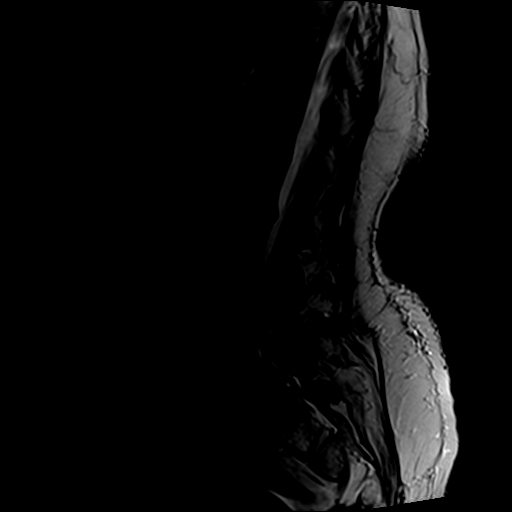
[im 10/13]
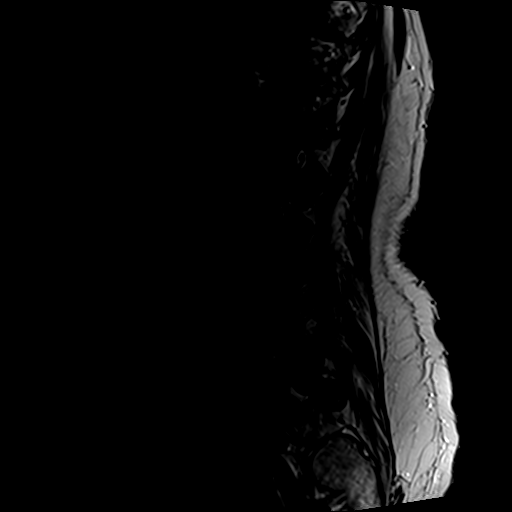
[im 13/13]
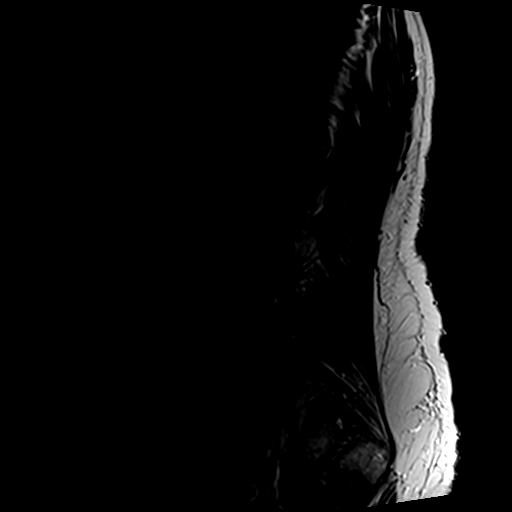

[Series 4: T1 · sagittal · 4.0mm · 0.55mm/px · 6 of 13 slices shown (1 of 2)]
[im 1/13]
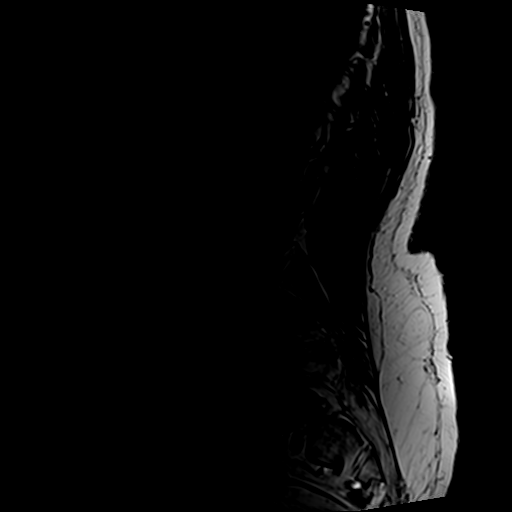
[im 3/13]
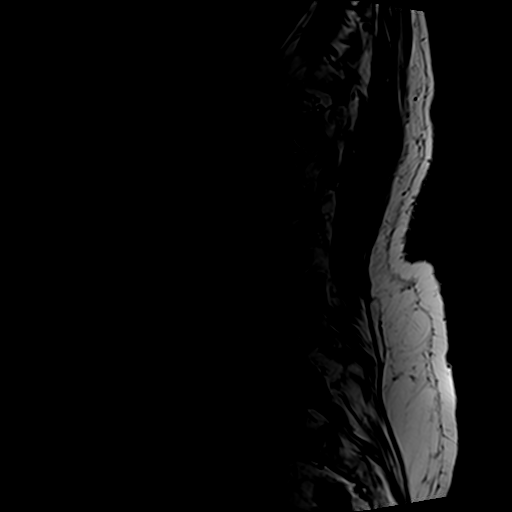
[im 5/13]
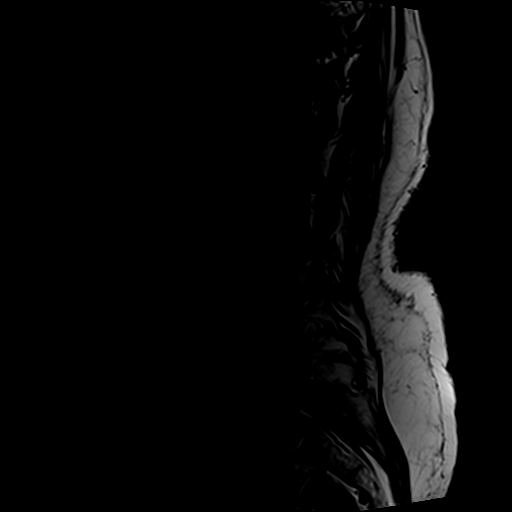
[im 8/13]
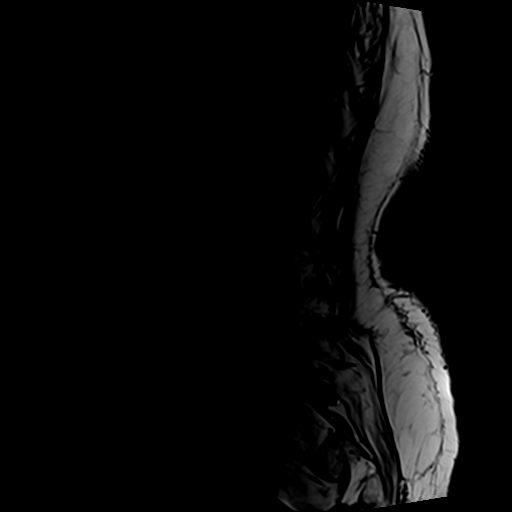
[im 10/13]
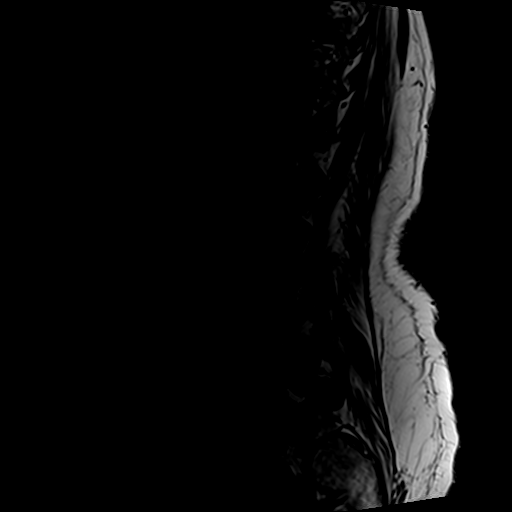
[im 13/13]
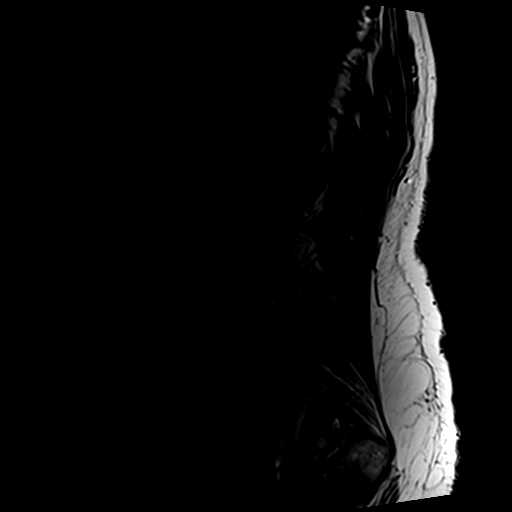

[Series 5: T2 · axial · 4.0mm · 0.70mm/px · z∈[-35,+157]mm · 9 of 33 slices shown]
[im 1/33]
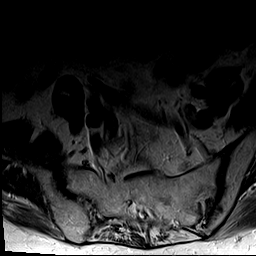
[im 5/33]
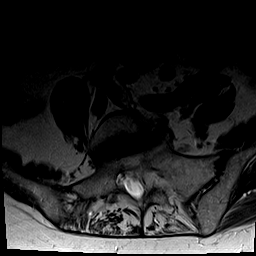
[im 10/33]
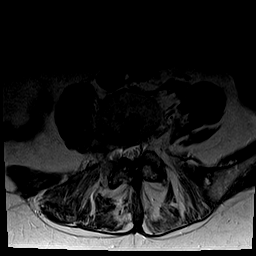
[im 14/33]
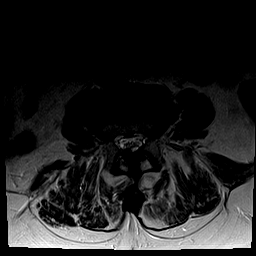
[im 17/33]
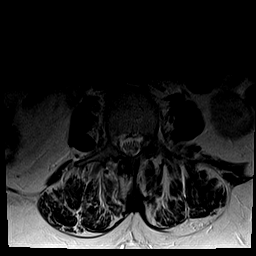
[im 19/33]
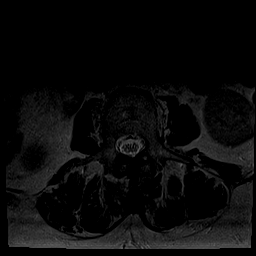
[im 23/33]
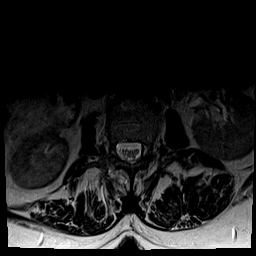
[im 28/33]
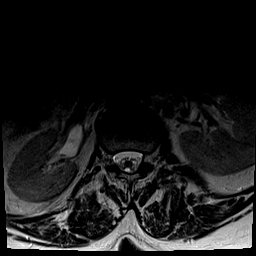
[im 33/33]
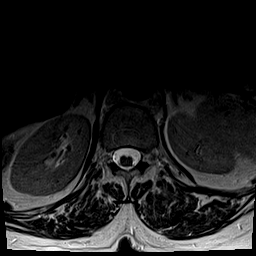

[Series 6: T1 · axial · 4.0mm · 0.35mm/px · z∈[-15,+111]mm · 3 of 33 slices shown (2 of 2)]
[im 5/33]
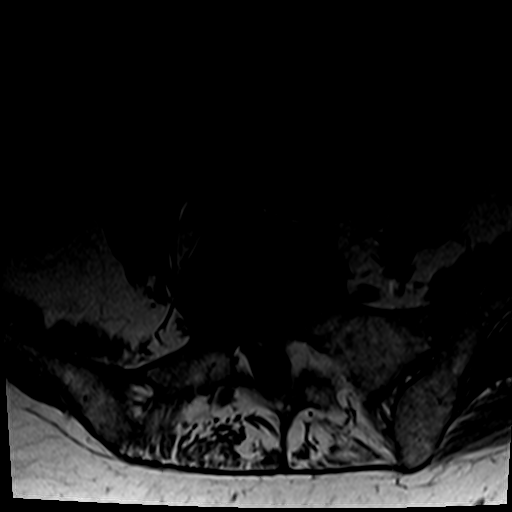
[im 17/33]
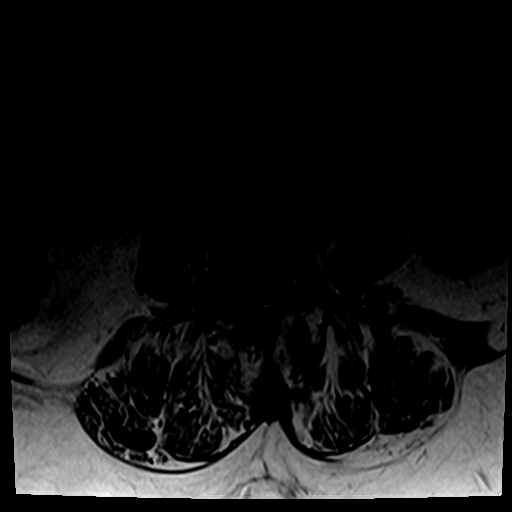
[im 28/33]
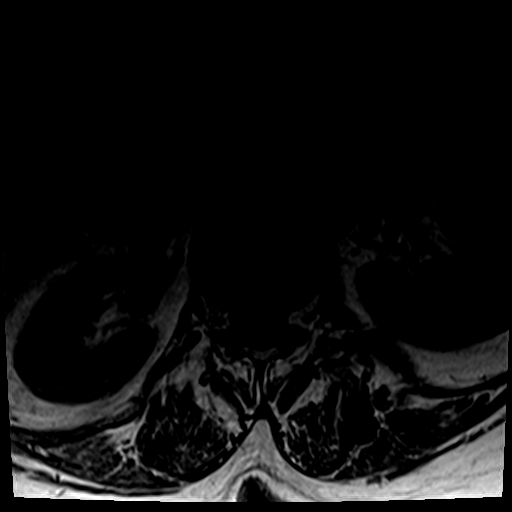

[24 of 48 positions shown; findings below may reference images not displayed]

FINDINGS: Segmentation:  Standard.

Alignment:  Grade 1 anterolisthesis of L4 on L5 and L5 on S1.

Vertebrae: L3, L4, and L5 Schmorl's nodes. Mild degenerative
endplate edema at L3-4 and L5-S1. L2 and S1 hemangiomas. No
suspicious osseous lesion.

Conus medullaris and cauda equina: Conus extends to the L1-2 level.
Conus and cauda equina appear normal.

Paraspinal and other soft tissues: Unremarkable.

Disc levels:

Disc desiccation throughout the lumbar spine. Severe disc space
narrowing at L4-5 with moderate narrowing at L2-3 and milder
narrowing at L3-4 and L5-S1.

T12-L1: Negative.

L1-2: Minimal disc bulging and mild ligamentum flavum hypertrophy
without stenosis.

L2-3: Mild circumferential disc bulging, small right foraminal to
extraforaminal disc protrusion, and mild facet and ligamentum flavum
hypertrophy result in mild spinal stenosis and borderline right
neural foraminal stenosis. The disc protrusion may affect the
extraforaminal right L2 nerve.

L3-4: Circumferential disc bulging, congenitally short pedicles, and
moderate facet and ligamentum flavum hypertrophy result in moderate
spinal stenosis without neural foraminal stenosis.

L4-5: Anterolisthesis with disc uncovering, ligamentum flavum
thickening, and severe facet arthrosis result in minimal right
neural foraminal stenosis without spinal stenosis.

L5-S1: Anterolisthesis with mild bulging of uncovered disc and
moderate to severe facet and ligamentum flavum hypertrophy result in
borderline left lateral recess stenosis and severe left neural
foraminal stenosis with potential left L5 nerve root impingement. No
spinal stenosis. Small facet joint effusions.
IMPRESSION: 1. Advanced lumbar disc and facet degeneration with moderate
multifactorial spinal stenosis at L3-4 and mild spinal stenosis at
L2-3.
2. Severe left neural foraminal stenosis at L5-S1.
3. Small right-sided disc protrusion at L2-3 potentially affecting
the extraforaminal L2 nerve.

## 2019-09-29 DIAGNOSIS — B372 Candidiasis of skin and nail: Secondary | ICD-10-CM | POA: Diagnosis not present

## 2019-10-04 DIAGNOSIS — L03031 Cellulitis of right toe: Secondary | ICD-10-CM | POA: Diagnosis not present

## 2019-10-04 DIAGNOSIS — W19XXXS Unspecified fall, sequela: Secondary | ICD-10-CM | POA: Diagnosis not present

## 2019-10-04 DIAGNOSIS — S0990XS Unspecified injury of head, sequela: Secondary | ICD-10-CM | POA: Diagnosis not present

## 2019-10-04 DIAGNOSIS — R410 Disorientation, unspecified: Secondary | ICD-10-CM | POA: Diagnosis not present

## 2019-10-10 ENCOUNTER — Other Ambulatory Visit: Payer: Self-pay | Admitting: Registered Nurse

## 2019-10-10 DIAGNOSIS — Z1231 Encounter for screening mammogram for malignant neoplasm of breast: Secondary | ICD-10-CM

## 2019-11-01 ENCOUNTER — Other Ambulatory Visit: Payer: Self-pay

## 2019-11-01 ENCOUNTER — Ambulatory Visit
Admission: RE | Admit: 2019-11-01 | Discharge: 2019-11-01 | Disposition: A | Discharge status: Home / Self Care | Payer: Medicare Other | Source: Ambulatory Visit | Attending: Registered Nurse | Admitting: Registered Nurse

## 2019-11-01 DIAGNOSIS — Z1231 Encounter for screening mammogram for malignant neoplasm of breast: Secondary | ICD-10-CM

## 2019-11-15 ENCOUNTER — Other Ambulatory Visit: Payer: Self-pay | Admitting: Hematology and Oncology

## 2019-11-15 DIAGNOSIS — D693 Immune thrombocytopenic purpura: Secondary | ICD-10-CM

## 2019-11-16 ENCOUNTER — Telehealth: Payer: Self-pay | Admitting: Hematology and Oncology

## 2019-11-16 ENCOUNTER — Encounter: Payer: Self-pay | Admitting: Hematology and Oncology

## 2019-11-16 ENCOUNTER — Inpatient Hospital Stay: Payer: Medicare Other

## 2019-11-16 ENCOUNTER — Other Ambulatory Visit: Payer: Self-pay

## 2019-11-16 ENCOUNTER — Inpatient Hospital Stay: Payer: Medicare Other | Attending: Hematology and Oncology | Admitting: Hematology and Oncology

## 2019-11-16 DIAGNOSIS — D693 Immune thrombocytopenic purpura: Secondary | ICD-10-CM | POA: Diagnosis not present

## 2019-11-16 DIAGNOSIS — Z885 Allergy status to narcotic agent status: Secondary | ICD-10-CM | POA: Diagnosis not present

## 2019-11-16 DIAGNOSIS — Z8551 Personal history of malignant neoplasm of bladder: Secondary | ICD-10-CM | POA: Diagnosis not present

## 2019-11-16 DIAGNOSIS — R269 Unspecified abnormalities of gait and mobility: Secondary | ICD-10-CM | POA: Diagnosis not present

## 2019-11-16 DIAGNOSIS — R296 Repeated falls: Secondary | ICD-10-CM

## 2019-11-16 DIAGNOSIS — Z79899 Other long term (current) drug therapy: Secondary | ICD-10-CM | POA: Diagnosis not present

## 2019-11-16 DIAGNOSIS — Z882 Allergy status to sulfonamides status: Secondary | ICD-10-CM | POA: Diagnosis not present

## 2019-11-16 DIAGNOSIS — Z8572 Personal history of non-Hodgkin lymphomas: Secondary | ICD-10-CM

## 2019-11-16 DIAGNOSIS — Z881 Allergy status to other antibiotic agents status: Secondary | ICD-10-CM | POA: Insufficient documentation

## 2019-11-16 DIAGNOSIS — Z853 Personal history of malignant neoplasm of breast: Secondary | ICD-10-CM

## 2019-11-16 LAB — CBC WITH DIFFERENTIAL/PLATELET
Abs Immature Granulocytes: 0.02 10*3/uL (ref 0.00–0.07)
Basophils Absolute: 0.1 10*3/uL (ref 0.0–0.1)
Basophils Relative: 1 %
Eosinophils Absolute: 0.2 10*3/uL (ref 0.0–0.5)
Eosinophils Relative: 3 %
HCT: 37.4 % (ref 36.0–46.0)
Hemoglobin: 11.6 g/dL — ABNORMAL LOW (ref 12.0–15.0)
Immature Granulocytes: 0 %
Lymphocytes Relative: 31 %
Lymphs Abs: 2 10*3/uL (ref 0.7–4.0)
MCH: 27.2 pg (ref 26.0–34.0)
MCHC: 31 g/dL (ref 30.0–36.0)
MCV: 87.8 fL (ref 80.0–100.0)
Monocytes Absolute: 0.5 10*3/uL (ref 0.1–1.0)
Monocytes Relative: 8 %
Neutro Abs: 3.6 10*3/uL (ref 1.7–7.7)
Neutrophils Relative %: 57 %
Platelets: 135 10*3/uL — ABNORMAL LOW (ref 150–400)
RBC: 4.26 MIL/uL (ref 3.87–5.11)
RDW: 13.8 % (ref 11.5–15.5)
WBC: 6.4 10*3/uL (ref 4.0–10.5)
nRBC: 0 % (ref 0.0–0.2)

## 2019-11-16 NOTE — Assessment & Plan Note (Signed)
She has recurrent falls On exam, her gait appears unstable She was seen by neurologist in the past and I will reach out to her The patient also appears to have memory issues I recommend she return to see neurologist for further follow-up In the meantime, due to recurrent falls, I will refer her to physical therapist for rehab and gait assessment

## 2019-11-16 NOTE — Assessment & Plan Note (Signed)
She has no signs of cancer relapse No further imaging or follow-up in this regard is needed

## 2019-11-16 NOTE — Assessment & Plan Note (Signed)
She is not symptomatic Her last 3 platelet counts are stable The patient is educated to watch out for signs and symptoms of bleeding or excessive bruising She will continue aspirin therapy as long as her platelet count is above 50,000 We will see her back in a year

## 2019-11-16 NOTE — Telephone Encounter (Signed)
Scheduled appts per 7/15 sch msg. Gave pt a print out of AVS.  

## 2019-11-16 NOTE — Assessment & Plan Note (Signed)
She has no signs of cancer recurrence Recent mammogram is normal Continue surveillance mammogram as indicated

## 2019-11-16 NOTE — Progress Notes (Signed)
[facility] OFFICE PROGRESS NOTE  Patient Care Team: Holland Commons, FNP as PCP - General (Internal Medicine)  ASSESSMENT & PLAN:  History of breast cancer in female She has no signs of cancer recurrence Recent mammogram is normal Continue surveillance mammogram as indicated  History of non-Hodgkin's lymphoma She has no signs of cancer relapse No further imaging or follow-up in this regard is needed  History of bladder cancer Will defer to urologist for further follow-up  Chronic ITP (idiopathic thrombocytopenia) ([place]) She is not symptomatic Her last 3 platelet counts are stable The patient is educated to watch out for signs and symptoms of bleeding or excessive bruising She will continue aspirin therapy as long as her platelet count is above 50,000 We will see her back in a year  Recurrent falls She has recurrent falls On exam, her gait appears unstable She was seen by neurologist in the past and I will reach out to her The patient also appears to have memory issues I recommend she return to see neurologist for further follow-up In the meantime, due to recurrent falls, I will refer her to physical therapist for rehab and gait assessment    Orders Placed This Encounter  Procedures  . Ambulatory referral to Physical Therapy    Referral Priority:   Routine    Referral Type:   Physical Medicine    Referral Reason:   Specialty Services Required    Requested Specialty:   Physical Therapy    Number of Visits Requested:   1    All questions were answered. The patient knows to call the clinic with any problems, questions or concerns. The total time spent in the appointment was 20 minutes encounter with patients including review of chart and various tests results, discussions about plan of care and coordination of care plan   Heath Lark, MD 11/16/2019 11:03 AM  INTERVAL HISTORY: Please see below for problem oriented charting. She returns for further  follow-up for history of lymphoma, breast cancer, chronic ITP as well as history of bladder cancer She was referred to see neurologist in 2019 for unsteady gait Since then, she continues to have recurrent falls She has tendency to trip over objects and week She denies any presyncopal episode She was prescribed a walker and despite using her walker, she still feels unsteady Denies abnormal lymphadenopathy Recent mammogram was normal No recent bleeding  SUMMARY OF ONCOLOGIC HISTORY:  JONETTA DAGLEY was transferred to my care after her prior physician has left.  I reviewed the patient's records extensive and collaborated the history with the patient. 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 She never required any systemic treatment. 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 This has also resolved on its own. She has never developed any additional adenopathy other than the single lymph node palpable in the left supraclavicular fossa.  She did develop a second primary stage I,  0.8 cm,  ER negative cancer of the right breast, treated with lumpectomy, radiation and six cycles of CMF chemotherapy in 1994. No evidence of a new disease from the breast cancer either. Most recent mammogram done in June 2015 showed stable fibroglandular changes in her breasts. She was called back for an ultrasound of the left breast which also came back benign. She had a CT scan  of the abdomen done on 11/14/15 because of significant intentional weight loss and abdominal discomfort.  Imaging studies show no evidence of lymphoma.  Moderate stool burden was noted. Repeat CT scan in March 2019 show stable lymphadenopathy with no signs or symptoms of cancer recurrence On 01/26/2018, she underwent  transurethral bladder resection of the tumor by urologist  REVIEW OF SYSTEMS:   Constitutional: Denies fevers, chills or abnormal weight loss Eyes: Denies blurriness of vision Ears, nose, mouth, throat, and face: Denies mucositis or sore throat Respiratory: Denies cough, dyspnea or wheezes Cardiovascular: Denies palpitation, chest discomfort or lower extremity swelling Gastrointestinal:  Denies nausea, heartburn or change in bowel habits Skin: Denies abnormal skin rashes Lymphatics: Denies new lymphadenopathy or easy bruising Behavioral/Psych: Mood is stable, no new changes  All other systems were reviewed with the patient and are negative.  I have reviewed the past medical history, past surgical history, social history and family history with the patient and they are unchanged from previous note.  ALLERGIES:  is allergic to oxycodone, codeine, doxycycline, meperidine and related, sulfa antibiotics, and sulfacetamide sodium.  MEDICATIONS:  Current Outpatient Medications  Medication Sig Dispense Refill  . acetaminophen (TYLENOL) 500 MG tablet Take 1 tablet (500 mg total) by mouth every 6 (six) hours as needed for mild pain. (Patient taking differently: Take 1,000 mg by mouth every 8 (eight) hours as needed for mild pain. ) 30 tablet 0  . amoxicillin (AMOXIL) 500 MG capsule Take 2,000 mg by mouth once as needed (One hour prior to dental appointment).     Marland Kitchen aspirin 81 MG tablet Take 81 mg by mouth daily.    Marland Kitchen augmented betamethasone dipropionate (DIPROLENE-AF) 0.05 % ointment Apply 1 application topically daily as needed (itching).     . Calcium Carbonate-Vit D-Min (CALTRATE 600+D PLUS MINERALS) 600-800 MG-UNIT CHEW Chew 2 each by mouth 2 (two) times daily.    . cholecalciferol (VITAMIN D) 1000 UNITS tablet Take 1,000 Units by mouth daily.    . citalopram (CELEXA) 40 MG tablet Take 40 mg by mouth daily.     . Cyanocobalamin (VITAMIN B-12 PO) Take 1,000 mcg by mouth daily.    . diclofenac  Sodium (VOLTAREN) 1 % GEL diclofenac 1 % topical gel    . Difluprednate (DUREZOL) 0.05 % EMUL Place 2 drops into the right eye 2 (two) times daily.     . ergocalciferol (VITAMIN D2) 1.25 MG (50000 UT) capsule     . esomeprazole (NEXIUM) 40 MG capsule Take 40 mg by mouth 2 (two) times daily before a meal.     . famciclovir (FAMVIR) 125 MG tablet     . L-Methylfolate-Algae-B12-B6 (METANX) 3-90.314-2-35 MG CAPS     . levothyroxine (SYNTHROID, LEVOTHROID) 75 MCG tablet Take 75 mcg by mouth daily before breakfast.     . lovastatin (MEVACOR) 20 MG tablet Take 20 mg by mouth daily.     . magnesium oxide (MAG-OX) 400 MG tablet Take 400 mg by mouth daily.    . metoprolol succinate (TOPROL-XL) 25 MG 24 hr tablet Take 25 mg by mouth daily.     . mirabegron ER (MYRBETRIQ) 25 MG TB24 tablet Myrbetriq 25 mg tablet,extended release    . moxifloxacin (VIGAMOX) 0.5 % ophthalmic solution moxifloxacin 0.5 % eye drops    . Multiple Vitamins-Minerals (MULTIVITAMINS THER. W/MINERALS) TABS Take 1 tablet by mouth daily.      Marland Kitchen nystatin ointment (MYCOSTATIN) nystatin 100,000 unit/gram topical ointment    . nystatin-triamcinolone ointment (MYCOLOG) Apply  1 application topically daily as needed (itching).     Vladimir Faster Glycol-Propyl Glycol (SYSTANE) 0.4-0.3 % SOLN Apply 2 drops to eye 2 (two) times daily as needed (for dry eyes).    . pregabalin (LYRICA) 50 MG capsule     . triamcinolone ointment (KENALOG) 0.1 % Apply 1 application topically 2 (two) times daily.     No current facility-administered medications for this visit.    PHYSICAL EXAMINATION: ECOG PERFORMANCE STATUS: 2 - Symptomatic, <50% confined to bed  Vitals:   11/16/19 0936  BP: 139/73  Pulse: 70  Resp: 17  Temp: 98.5 F (36.9 C)  SpO2: 96%   Filed Weights   11/16/19 0936  Weight: 169 lb 8 oz (76.9 kg)    GENERAL:alert, no distress and comfortable NEURO: alert & oriented x 3 with fluent speech, somewhat forgetful, with unstable gait as  she walks with her walker  LABORATORY DATA:  I have reviewed the data as listed    Component Value Date/Time   NA 136 05/19/2018 1551   NA 135 (L) 07/07/2016 1035   K 4.0 05/19/2018 1551   K 4.8 07/07/2016 1035   CL 105 05/19/2018 1551   CL 106 07/11/2012 0906   CO2 21 (L) 05/19/2018 1551   CO2 24 07/07/2016 1035   GLUCOSE 89 05/19/2018 1551   GLUCOSE 85 07/07/2016 1035   GLUCOSE 107 (H) 07/11/2012 0906   BUN 13 05/19/2018 1551   BUN 19.9 07/07/2016 1035   CREATININE 0.62 05/19/2018 1551   CREATININE 0.7 07/07/2016 1035   CALCIUM 9.6 05/19/2018 1551   CALCIUM 10.2 07/07/2016 1035   PROT 6.9 07/08/2017 1053   PROT 6.6 07/07/2016 1035   ALBUMIN 3.9 07/08/2017 1053   ALBUMIN 3.9 07/07/2016 1035   AST 14 07/08/2017 1053   AST 14 07/07/2016 1035   ALT 19 07/08/2017 1053   ALT 17 07/07/2016 1035   ALKPHOS 60 07/08/2017 1053   ALKPHOS 58 07/07/2016 1035   BILITOT 0.7 07/08/2017 1053   BILITOT 0.61 07/07/2016 1035   GFRNONAA >60 05/19/2018 1551   GFRAA >60 05/19/2018 1551    No results found for: SPEP, UPEP  Lab Results  Component Value Date   WBC 6.4 11/16/2019   NEUTROABS 3.6 11/16/2019   HGB 11.6 (L) 11/16/2019   HCT 37.4 11/16/2019   MCV 87.8 11/16/2019   PLT 135 (L) 11/16/2019      Chemistry      Component Value Date/Time   NA 136 05/19/2018 1551   NA 135 (L) 07/07/2016 1035   K 4.0 05/19/2018 1551   K 4.8 07/07/2016 1035   CL 105 05/19/2018 1551   CL 106 07/11/2012 0906   CO2 21 (L) 05/19/2018 1551   CO2 24 07/07/2016 1035   BUN 13 05/19/2018 1551   BUN 19.9 07/07/2016 1035   CREATININE 0.62 05/19/2018 1551   CREATININE 0.7 07/07/2016 1035      Component Value Date/Time   CALCIUM 9.6 05/19/2018 1551   CALCIUM 10.2 07/07/2016 1035   ALKPHOS 60 07/08/2017 1053   ALKPHOS 58 07/07/2016 1035   AST 14 07/08/2017 1053   AST 14 07/07/2016 1035   ALT 19 07/08/2017 1053   ALT 17 07/07/2016 1035   BILITOT 0.7 07/08/2017 1053   BILITOT 0.61 07/07/2016  1035       RADIOGRAPHIC STUDIES: I have personally reviewed the radiological images as listed and agreed with the findings in the report. MM 3D SCREEN BREAST BILATERAL  Result Date: 11/03/2019  CLINICAL DATA:  Screening. EXAM: DIGITAL SCREENING BILATERAL MAMMOGRAM WITH TOMO AND CAD COMPARISON:  Previous exam(s). ACR Breast Density Category b: There are scattered areas of fibroglandular density. FINDINGS: There are no findings suspicious for malignancy. Post lumpectomy changes on the right are stable. Images were processed with CAD. IMPRESSION: No mammographic evidence of malignancy. A result letter of this screening mammogram will be mailed directly to the patient. RECOMMENDATION: Screening mammogram in one year. (Code:SM-B-01Y) BI-RADS CATEGORY  2: Benign. Electronically Signed   By: Lajean Manes M.D.   On: 11/03/2019 08:50

## 2019-11-16 NOTE — Assessment & Plan Note (Signed)
Will defer to urologist for further follow-up

## 2019-11-20 ENCOUNTER — Encounter: Payer: Self-pay | Admitting: Neurology

## 2019-11-29 ENCOUNTER — Encounter: Payer: Self-pay | Admitting: Podiatry

## 2019-11-29 ENCOUNTER — Other Ambulatory Visit: Payer: Self-pay

## 2019-11-29 ENCOUNTER — Ambulatory Visit (INDEPENDENT_AMBULATORY_CARE_PROVIDER_SITE_OTHER): Payer: Medicare Other | Admitting: Podiatry

## 2019-11-29 DIAGNOSIS — M79676 Pain in unspecified toe(s): Secondary | ICD-10-CM | POA: Diagnosis not present

## 2019-11-29 DIAGNOSIS — B351 Tinea unguium: Secondary | ICD-10-CM | POA: Diagnosis not present

## 2019-11-29 NOTE — Progress Notes (Signed)
This patient returns to the office for evaluation and treatment of long thick painful nails .  This patient is unable to trim his own nails since the patient cannot reach the feet.  Patient says the nails are painful walking and wearing his shoes.  He returns for preventive foot care services.  General Appearance  Alert, conversant and in no acute stress.  Vascular  Dorsalis pedis and posterior tibial  pulses are palpable  bilaterally.  Capillary return is within normal limits  bilaterally. Temperature is within normal limits  bilaterally.  Neurologic  Senn-Weinstein monofilament wire test within normal limits  bilaterally. Muscle power within normal limits bilaterally.  Nails Thick disfigured discolored nails with subungual debris  from hallux to fifth toes bilaterally. No evidence of bacterial infection or drainage bilaterally.  Orthopedic  No limitations of motion  feet .  No crepitus or effusions noted.  No bony pathology or digital deformities noted.  Hallux varus 1st MPJ and hallux malleus right foot.   Hammer toes 2,3 right.  Skin  normotropic skin with no porokeratosis noted bilaterally.  No signs of infections or ulcers noted.     Onychomycosis  Pain in toes right foot  Pain in toes left foot  Debridement  of nails  1-5  B/L with a nail nipper.  Nails were then filed using a dremel tool with no incidents.    RTC 3 months    Gardiner Barefoot DPM

## 2019-12-07 DIAGNOSIS — M19019 Primary osteoarthritis, unspecified shoulder: Secondary | ICD-10-CM | POA: Diagnosis not present

## 2019-12-07 DIAGNOSIS — E7800 Pure hypercholesterolemia, unspecified: Secondary | ICD-10-CM | POA: Diagnosis not present

## 2019-12-07 DIAGNOSIS — E785 Hyperlipidemia, unspecified: Secondary | ICD-10-CM | POA: Diagnosis not present

## 2019-12-07 DIAGNOSIS — M810 Age-related osteoporosis without current pathological fracture: Secondary | ICD-10-CM | POA: Diagnosis not present

## 2019-12-07 DIAGNOSIS — E039 Hypothyroidism, unspecified: Secondary | ICD-10-CM | POA: Diagnosis not present

## 2019-12-08 ENCOUNTER — Ambulatory Visit (INDEPENDENT_AMBULATORY_CARE_PROVIDER_SITE_OTHER): Payer: Medicare Other | Admitting: Neurology

## 2019-12-08 ENCOUNTER — Encounter: Payer: Self-pay | Admitting: Neurology

## 2019-12-08 ENCOUNTER — Other Ambulatory Visit: Payer: Self-pay

## 2019-12-08 VITALS — BP 124/79 | HR 72 | Ht 60.0 in | Wt 166.8 lb

## 2019-12-08 DIAGNOSIS — R2689 Other abnormalities of gait and mobility: Secondary | ICD-10-CM | POA: Diagnosis not present

## 2019-12-08 DIAGNOSIS — M4807 Spinal stenosis, lumbosacral region: Secondary | ICD-10-CM

## 2019-12-08 DIAGNOSIS — M48061 Spinal stenosis, lumbar region without neurogenic claudication: Secondary | ICD-10-CM

## 2019-12-08 NOTE — Progress Notes (Signed)
[name] Clinic Note - Initial Visit   Date: 12/08/19  Darlene [name] MRN: [id number] DOB: 08-29-1934   Dear Holland Commons, FNP:  Thank you for your kind referral of Darlene [name] for consultation of falls. Although her history is well known to you, please allow D[facility]ene [name] to reiterate it for the purpose of our medical record. The patient was accompanied to the clinic by self.   History of Present Illness: Darlene [name] is a 85 y.o. left-handed female with depression, GERD, hypothyroidism, and history of breast cancer s/p chemotherapy and radiation (1994), chronic ITP, breast cancer, lymphoma(1999), and trigeminal neuralgia presenting for evaluation of falls.   She has imbalance since 2017.  She was seen by me in 2019 for the same symptoms at which time MRI lumbar spine shows moderate lumbar canal stenosis at L3-4 and severe left foraminal stenosis at L5-S1.  She was referred to see neurosurgery, but it does not appear that this happened. She has been using a walker which helps.  Since this time, she has been having ongoing imbalance, some of which is due to her right foot turning inwards because of foot deformity from bunion.  She denies back pain and leg weakness.  No numbness/tingling of the legs.   Out-side paper records, electronic medical record, and images have been reviewed where available and summarized as:  CT head 07/14/2019: no intracranial abnormality  CT cervical spine wo contrast 04/22/2019: 1. No acute fracture or subluxation of the cervical spine. 2. Advanced multilevel degenerative disc disease and facet hypertrophy. Anterolisthesis of C3 on C4 is likely degenerative and facet mediated.  MRI Lumbar spine 08/01/2017: 1. Advanced lumbar disc and facet degeneration with moderate multifactorial spinal stenosis at L3-4 and mild spinal stenosis at L2-3. 2. Severe left neural foraminal stenosis at L5-S1. 3. Small right-sided disc  protrusion at L2-3 potentially affecting the extraforaminal L2 nerve.   Lab Results  Component Value Date   TSH 2.48 07/19/2017   Lab Results  Component Value Date   ESRSEDRATE 4 07/12/2014    Past Medical History:  Diagnosis Date  . Anxiety   . Breast cancer, stage 1, estrogen receptor negative ([facility]) 07/06/2011  . Cancer of breast ([place])   . Cataract   . Chronic ITP (idiopathic thrombocytopenia) ([facility]) 07/06/2011  . Constipation    takes Colace daily  . COPD (chronic obstructive pulmonary disease) ([facility])    mild  . Depression    "nervous break down age 29"  . Fast heart beat   . GERD (gastroesophageal reflux disease)    takes Nexium daily  . History of colon polyps   . History of depression    received shock treatments   . History of palpitations    takes Metoprolol daily  . History of vertigo   . HOH (hard of hearing)    has hearing aids  . Hyperlipidemia    takes Lovastatin daily  . Hypertension   . Hypothyroid 07/06/2011   takes SYnthroid daily  . Joint pain   . Joint swelling   . Lymphoma ([place] [facility])   . Lymphoma ([place])    B cell   . Lymphoma of lymph nodes of head, face, and/or neck ([place]) 07/06/2011  . Neuropathy   . Nocturia   . Osteopenia   . Osteoporosis    hx of and take Reclast  . Peripheral neuropathy   . Personal history of chemotherapy   . Personal history of radiation therapy   . Right  knee DJD   . Shortness of breath dyspnea    with exertion  . Thyroid disease   . Umbilical hernia     Past Surgical History:  Procedure Laterality Date  . ADENOIDECTOMY     as a child  . BREAST LUMPECTOMY Right 1994  . BREAST SURGERY Right   . BUNIONECTOMY Right   . CARDIAC CATHETERIZATION  2014  . COLONOSCOPY    . FOOT SURGERY Right   . FRACTURE SURGERY     wrist right , right knee  . INSERTION OF MESH N/A 04/17/2015   Procedure: INSERTION OF MESH;  Surgeon: Ralene Ok, MD;  Location: [facility];  Service: General;  Laterality: N/A;  . KNEE SURGERY    . LEFT  HEART CATHETERIZATION WITH CORONARY ANGIOGRAM N/A 12/05/2012   Procedure: LEFT HEART CATHETERIZATION WITH CORONARY ANGIOGRAM;  Surgeon: [name] Sine, MD;  Location[facility];  Service: Cardiovascular;  Laterality: N/A;  . right  breast surgery     d/t breast cancer  . TONSILLECTOMY     as a child  . TOTAL KNEE ARTHROPLASTY Right 06/19/2013   Procedure: TOTAL KNEE ARTHROPLASTY;  Surgeon: Lorn Junes, MD;  Location: [facility];  Service: Orthopedics;  Laterality: Right;  . TRANSURETHRAL RESECTION OF BLADDER TUMOR WITH GYRUS (TURBT-GYRUS)     01-26-18 Dr. Louis Meckel  . TRANSURETHRAL RESECTION OF BLADDER TUMOR WITH MITOMYCIN-C Bilateral 01/26/2018   Procedure: TRANSURETHRAL RESECTION OF BLADDER TUMOR WITH GEMCITABIN BLLATERAL RETROGRADE PYELOGRAM;  Surgeon: Ardis Hughs, MD;  Location: [facility];  Service: Urology;  Laterality: Bilateral;  . TURBINATE REDUCTION    . UMBILICAL HERNIA REPAIR N/A 04/17/2015   Procedure: LAPAROSCOPIC UMBILICAL HERNIA REPAIR WITH MESH;  Surgeon: Ralene Ok, MD;  Location: [facility];  Service: General;  Laterality: N/A;  . UPPER GASTROINTESTINAL ENDOSCOPY       Medications:  Outpatient Encounter Medications as of 12/08/2019  Medication Sig  . acetaminophen (TYLENOL) 500 MG tablet Take 1 tablet (500 mg total) by mouth every 6 (six) hours as needed for mild pain. (Patient taking differently: Take 1,000 mg by mouth every 8 (eight) hours as needed for mild pain. )  . amoxicillin (AMOXIL) 500 MG capsule Take 2,000 mg by mouth once as needed (One hour prior to dental appointment).   Marland Kitchen aspirin 81 MG tablet Take 81 mg by mouth daily.  Marland Kitchen augmented betamethasone dipropionate (DIPROLENE-AF) 0.05 % ointment Apply 1 application topically daily as needed (itching).   . Calcium Carbonate-Vit D-Min (CALTRATE 600+D PLUS MINERALS) 600-800 MG-UNIT CHEW Chew 2 each by mouth 2 (two) times daily.  . cholecalciferol (VITAMIN D) 1000 UNITS tablet Take 1,000 Units by mouth daily.  .  citalopram (CELEXA) 40 MG tablet Take 40 mg by mouth daily.   . Cyanocobalamin (VITAMIN B-12 PO) Take 1,000 mcg by mouth daily.  . diclofenac Sodium (VOLTAREN) 1 % GEL diclofenac 1 % topical gel  . Difluprednate (DUREZOL) 0.05 % EMUL Place 2 drops into the right eye 2 (two) times daily.   . ergocalciferol (VITAMIN D2) 1.25 MG (50000 UT) capsule   . esomeprazole (NEXIUM) 40 MG capsule Take 40 mg by mouth 2 (two) times daily before a meal.   . famciclovir (FAMVIR) 125 MG tablet   . L-Methylfolate-Algae-B12-B6 (METANX) 3-90.314-2-35 MG CAPS   . levothyroxine (SYNTHROID, LEVOTHROID) 75 MCG tablet Take 75 mcg by mouth daily before breakfast.   . lovastatin (MEVACOR) 20 MG tablet Take 20 mg by mouth daily.   . magnesium oxide (  MAG-OX) 400 MG tablet Take 400 mg by mouth daily.  . metoprolol succinate (TOPROL-XL) 25 MG 24 hr tablet Take 25 mg by mouth daily.   . mirabegron ER (MYRBETRIQ) 25 MG TB24 tablet Myrbetriq 25 mg tablet,extended release  . moxifloxacin (VIGAMOX) 0.5 % ophthalmic solution moxifloxacin 0.5 % eye drops  . Multiple Vitamins-Minerals (MULTIVITAMINS THER. W/MINERALS) TABS Take 1 tablet by mouth daily.    Marland Kitchen nystatin ointment (MYCOSTATIN) nystatin 100,000 unit/gram topical ointment  . nystatin-triamcinolone ointment (MYCOLOG) Apply 1 application topically daily as needed (itching).   Vladimir Faster Glycol-Propyl Glycol (SYSTANE) 0.4-0.3 % SOLN Apply 2 drops to eye 2 (two) times daily as needed (for dry eyes).  . pregabalin (LYRICA) 50 MG capsule   . triamcinolone ointment (KENALOG) 0.1 % Apply 1 application topically 2 (two) times daily.   No facility-administered encounter medications on file as of 12/08/2019.    Allergies:  Allergies  Allergen Reactions  . Oxycodone Other (See Comments)    Made her depressed  . Codeine Itching           . Doxycycline Swelling and Other (See Comments)    Burns throat also  . Meperidine And Related Itching and Rash    Makes cheeks red also  .  Sulfa Antibiotics Itching  . Sulfacetamide Sodium Itching    Family History: Family History  Problem Relation Age of Onset  . Hyperlipidemia Father   . Heart disease Father   . Heart disease Sister   . Cancer Brother   . Diabetes Son   . Heart disease Maternal Grandfather   . Colon cancer Neg Hx   . Breast cancer Neg Hx     Social History: Social History   Tobacco Use  . Smoking status: Former Smoker    Packs/day: 0.30    Types: Cigarettes  . Smokeless tobacco: Never Used  . Tobacco comment: quit smoking 1985  was a casual smoker  Vaping Use  . Vaping Use: Never used  Substance Use Topics  . Alcohol use: No    Alcohol/week: 0.0 standard drinks  . Drug use: No   Social History   Social History Narrative   Lives at home alone.   Right-handed.   4 cups of caffeine daily.    Vital Signs:  BP 124/79 (BP Location: Left Arm, Patient Position: Sitting, Cuff Size: Small)   Pulse 72   Ht 5' (1.524 m)   Wt 166 lb 12.8 oz (75.7 kg)   SpO2 96%   BMI 32.58 kg/m    General Medical Exam:   General:  Elderly appearing, comfortable.   Eyes/ENT: see cranial nerve examination.   Neck:   No carotid bruits. Respiratory:  Clear to auscultation, good air entry bilaterally.   Cardiac:  Regular rate and rhythm, no murmur.   Extremities:  Right foot deformities causing foot inversion.  No edema, or skin discoloration.  Skin:  No rashes or lesions.  Neurological Exam: MENTAL STATUS including orientation to time, place, person, recent and remote memory, attention span and concentration, language, and fund of knowledge is normal.  Speech is not dysarthric.  Verbose and hard to redirect   CRANIAL NERVES: II:  No visual field defects.   III-IV-VI: Pupils equal round and reactive to light.  Normal conjugate, extra-ocular eye movements in all directions of gaze.  No nystagmus.  No ptosis.   VII:  Normal facial symmetry and movements.   VIII:  Normal hearing and vestibular function.     IX-X:  Normal palatal movement.   XI:  Normal shoulder shrug and head rotation.    MOTOR:  No atrophy, fasciculations or abnormal movements.  No pronator drift. R foot deformity.  Upper Extremity:  Right  Left  Deltoid  5/5   5/5   Biceps  5/5   5/5   Triceps  5/5   5/5   Infraspinatus 5/5  5/5  Medial pectoralis 5/5  5/5  Wrist extensors  5/5   5/5   Wrist flexors  5/5   5/5   Finger extensors  5/5   5/5   Finger flexors  5/5   5/5   Dorsal interossei  5/5   5/5   Abductor pollicis  5/5   5/5   Tone (Ashworth scale)  0  0   Lower Extremity:  Right  Left  Hip flexors  5/5   5/5   Hip extensors  5/5   5/5   Adductor 5/5  5/5  Abductor 5/5  5/5  Knee flexors  5/5   5/5   Knee extensors  5/5   5/5   Dorsiflexors  5/5   5/5   Plantarflexors  5/5   5/5   Toe extensors  5/5   5/5   Toe flexors  5/5   5/5   Tone (Ashworth scale)  0  0   MSRs:  Right        Left                  brachioradialis 2+  2+  biceps 2+  2+  triceps 2+  2+  patellar 3+  3+  ankle jerk 0  0  Hoffman no  no  plantar response down  down   SENSORY:  Absent vibration in the feet, temperature and pin prick is intact throughout    COORDINATION/GAIT: Normal finger-to- nose-finger. Gait is shows right leg spasticity with inversion of the foot, assisted with walker   IMPRESSION: Multifactorial gait instability due to R foot deformity, lumbosacral canal stenosis (L3-4), spasticity, and neuropathy  - MRI lumbar spine wo to evaluate for compressive pathology  - Start physical therapy  - Muscle relaxants declined  Further recommendations pending results.  Thank you for allowing me to participate in patient's care.  If I can answer any additional questions, I would be pleased to do so.    Sincerely,    Sparsh Callens K. Posey Pronto, DO

## 2019-12-08 NOTE — Patient Instructions (Addendum)
Start physical therapy for balance training  MRI lumbar spine wo contrast.  We will call you with the results. We have sent a referral to [facility] for your MRI and they will call you directly to schedule your appointment. They are located at [address]. If you need to contact them directly please call [phone].

## 2019-12-14 DIAGNOSIS — K219 Gastro-esophageal reflux disease without esophagitis: Secondary | ICD-10-CM | POA: Diagnosis not present

## 2019-12-14 DIAGNOSIS — C8591 Non-Hodgkin lymphoma, unspecified, lymph nodes of head, face, and neck: Secondary | ICD-10-CM | POA: Diagnosis not present

## 2019-12-14 DIAGNOSIS — E785 Hyperlipidemia, unspecified: Secondary | ICD-10-CM | POA: Diagnosis not present

## 2019-12-14 DIAGNOSIS — I10 Essential (primary) hypertension: Secondary | ICD-10-CM | POA: Diagnosis not present

## 2019-12-14 DIAGNOSIS — G629 Polyneuropathy, unspecified: Secondary | ICD-10-CM | POA: Diagnosis not present

## 2019-12-14 DIAGNOSIS — Z0000 Encounter for general adult medical examination without abnormal findings: Secondary | ICD-10-CM | POA: Diagnosis not present

## 2019-12-14 DIAGNOSIS — E039 Hypothyroidism, unspecified: Secondary | ICD-10-CM | POA: Diagnosis not present

## 2019-12-14 DIAGNOSIS — M4800 Spinal stenosis, site unspecified: Secondary | ICD-10-CM | POA: Diagnosis not present

## 2019-12-23 ENCOUNTER — Ambulatory Visit
Admission: RE | Admit: 2019-12-23 | Discharge: 2019-12-23 | Disposition: A | Discharge status: Home / Self Care | Payer: Medicare Other | Source: Ambulatory Visit | Attending: Neurology | Admitting: Neurology

## 2019-12-23 ENCOUNTER — Other Ambulatory Visit: Payer: Self-pay

## 2019-12-23 DIAGNOSIS — M48061 Spinal stenosis, lumbar region without neurogenic claudication: Secondary | ICD-10-CM

## 2019-12-23 DIAGNOSIS — M4807 Spinal stenosis, lumbosacral region: Secondary | ICD-10-CM

## 2019-12-26 ENCOUNTER — Telehealth: Payer: Self-pay | Admitting: Neurology

## 2019-12-26 DIAGNOSIS — S22079A Unspecified fracture of T9-T10 vertebra, initial encounter for closed fracture: Secondary | ICD-10-CM

## 2019-12-26 DIAGNOSIS — G959 Disease of spinal cord, unspecified: Secondary | ICD-10-CM

## 2019-12-26 MED ORDER — DIAZEPAM 5 MG PO TABS: 5.0000 mg | ORAL_TABLET | Freq: Four times a day (QID) | ORAL | 0 refills | Status: DC | PRN

## 2019-12-26 NOTE — Telephone Encounter (Signed)
Called and informed patient that her MRI lumbar spine shows multilevel degenerative changes, but more importantly, there is a vertebral compression frature at T9 with probable spinal stenosis at this level, which explains her myelopathic exam findings.  I will obtained MRI thoracic spine to better evaluate this level.  She requested premedication due to anxiety and knows to take a driver to her appointment.   Darlene [name] K. Posey Pronto, DO

## 2020-01-03 ENCOUNTER — Ambulatory Visit: Payer: Medicare Other | Admitting: Physical Therapy

## 2020-01-09 ENCOUNTER — Ambulatory Visit: Payer: Medicare Other | Attending: Neurology | Admitting: Physical Therapy

## 2020-01-09 ENCOUNTER — Other Ambulatory Visit: Payer: Self-pay

## 2020-01-09 DIAGNOSIS — R293 Abnormal posture: Secondary | ICD-10-CM | POA: Diagnosis not present

## 2020-01-09 DIAGNOSIS — R296 Repeated falls: Secondary | ICD-10-CM | POA: Diagnosis not present

## 2020-01-09 DIAGNOSIS — M542 Cervicalgia: Secondary | ICD-10-CM | POA: Diagnosis not present

## 2020-01-10 ENCOUNTER — Encounter: Payer: Self-pay | Admitting: Physical Therapy

## 2020-01-10 NOTE — Therapy (Signed)
[place], [place], [address] Phone: [phone]   Fax:  [phone]  Physical Therapy Evaluation  Patient Details  Name: Darlene [name] MRN: [id number] Date of Birth: 1934-09-09 Referring Provider (PT): Dr Narda Amber    Encounter Date: 01/09/2020   PT End of Session - 01/10/20 0824    Visit Number 1    Number of Visits 16    Date for PT Re-Evaluation 03/06/20    Authorization Type Medicare    PT Start Time 1100    PT Stop Time 1143    PT Time Calculation (min) 43 min    Activity Tolerance Patient tolerated treatment well    Behavior During Therapy [facility] for tasks assessed/performed           Past Medical History:  Diagnosis Date  . Anxiety   . Breast cancer, stage 1, estrogen receptor negative ([place]) 07/06/2011  . Cancer of breast ([place])   . Cataract   . Chronic ITP (idiopathic thrombocytopenia) (HCC) 07/06/2011  . Constipation    takes Colace daily  . COPD (chronic obstructive pulmonary disease) (HCC)    mild  . Depression    "nervous break down age 41"  . Fast heart beat   . GERD (gastroesophageal reflux disease)    takes Nexium daily  . History of colon polyps   . History of depression    received shock treatments   . History of palpitations    takes Metoprolol daily  . History of vertigo   . HOH (hard of hearing)    has hearing aids  . Hyperlipidemia    takes Lovastatin daily  . Hypertension   . Hypothyroid 07/06/2011   takes SYnthroid daily  . Joint pain   . Joint swelling   . Lymphoma ([place])   . Lymphoma ([place])    B cell   . Lymphoma of lymph nodes of head, face, and/or neck ([place]) 07/06/2011  . Neuropathy   . Nocturia   . Osteopenia   . Osteoporosis    hx of and take Reclast  . Peripheral neuropathy   . Personal history of chemotherapy   . Personal history of radiation therapy   . Right knee DJD   . Shortness of breath dyspnea    with exertion  . Thyroid disease   .  Umbilical hernia     Past Surgical History:  Procedure Laterality Date  . ADENOIDECTOMY     as a child  . BREAST LUMPECTOMY Right 1994  . BREAST SURGERY Right   . BUNIONECTOMY Right   . CARDIAC CATHETERIZATION  2014  . COLONOSCOPY    . FOOT SURGERY Right   . FRACTURE SURGERY     wrist right , right knee  . INSERTION OF MESH N/A 04/17/2015   Procedure: INSERTION OF MESH;  Surgeon: Ralene Ok, MD;  Location: [place];  Service: General;  Laterality: N/A;  . KNEE SURGERY    . LEFT HEART CATHETERIZATION WITH CORONARY ANGIOGRAM N/A 12/05/2012   Procedure: LEFT HEART CATHETERIZATION WITH CORONARY ANGIOGRAM;  Surgeon: [name] Sine, MD;  Location: [facility] CATH LAB;  Service: Cardiovascular;  Laterality: N/A;  . right  breast surgery     d/t breast cancer  . TONSILLECTOMY     as a child  . TOTAL KNEE ARTHROPLASTY Right 06/19/2013   Procedure: TOTAL KNEE ARTHROPLASTY;  Surgeon: Lorn Junes, MD;  Location: [place];  Service: Orthopedics;  Laterality: Right;  . TRANSURETHRAL RESECTION  OF BLADDER TUMOR WITH GYRUS (TURBT-GYRUS)     01-26-18 Dr. Louis Meckel  . TRANSURETHRAL RESECTION OF BLADDER TUMOR WITH MITOMYCIN-C Bilateral 01/26/2018   Procedure: TRANSURETHRAL RESECTION OF BLADDER TUMOR WITH GEMCITABIN BLLATERAL RETROGRADE PYELOGRAM;  Surgeon: Ardis Hughs, MD;  [facility];  Service: Urology;  Laterality: Bilateral;  . TURBINATE REDUCTION    . UMBILICAL HERNIA REPAIR N/A 04/17/2015   Procedure: LAPAROSCOPIC UMBILICAL HERNIA REPAIR WITH MESH;  Surgeon: Ralene Ok, MD;  Location: [place];  Service: General;  Laterality: N/A;  . UPPER GASTROINTESTINAL ENDOSCOPY      There were no vitals filed for this visit.    Subjective Assessment - 01/09/20 1111    Subjective Patient has along history of falls. Her most severe fall was 3 years ago but she continues to have smaller fall frequently. She was found to have a thoraic compression fracture 2 weeks ago. She will have an MRI to evaualte  on 9/18. Her C/O is neck pain on ethe right side.    Pertinent History osteoperosis, HTN, brest cancer, depressio, lymphoma, osteopenia,    Limitations Standing;Walking    How long can you walk comfortably? Has to walk with the device. Can walk around the store if needed    Diagnostic tests Lumbar MRI: L2-L5 multi level degenration    Patient Stated Goals to have less and falls and less pain in her neck    Currently in Pain? No/denies              [facility] PT Assessment - 01/10/20 0001      Assessment   Medical Diagnosis Frequent falls/ Cervical pain     Referring Provider (PT) Dr Narda Amber     Onset Date/Surgical Date --   Falls have been going on for years/ Neck pain last MRI    Hand Dominance Left    Next MD Visit Nothing scheudled at this time     Prior Therapy None       Precautions   Precautions Fall    Precaution Comments frequent falls/ Osteoperosis       Restrictions   Weight Bearing Restrictions No      Balance Screen   Has the patient fallen in the past 6 months Yes    How many times? --   can not recall a speicifc number, but falls often    Has the patient had a decrease in activity level because of a fear of falling?  Yes    Is the patient reluctant to leave their home because of a fear of falling?  Yes      Home Environment   Additional Comments Lives alone but saghter leves close       Prior Function   Level of Independence Independent with household mobility with device    Vocation Retired    Leisure likes to go out ot lunch       Cognition   Overall Cognitive Status Within Functional Limits for tasks assessed    Attention Focused    Focused Attention Appears intact    Memory Appears intact    Awareness Appears intact      Observation/Other Assessments   Focus on Therapeutic Outcomes (FOTO)  44% limitation       Sensation   Light Touch Appears Intact      Coordination   Gross Motor Movements are Fluid and Coordinated Yes    Fine Motor  Movements are Fluid and Coordinated Yes      Posture/Postural  Control   Posture Comments increased khyphosis skight left head rotation; rounded shoulders      AROM   Overall AROM Comments full left ue ROM     AROM Assessment Site Cervical    Right Shoulder Flexion 140 Degrees   pain at end range    Right Shoulder External Rotation --   pain at end range    Right Shoulder Horizontal ABduction --   mid lower back    Cervical Flexion sits in 10 degrees of flexion     Cervical Extension  with pain advised not to over extend     Cervical - Right Rotation 20     Cervical - Left Rotation 50       Strength   Overall Strength Comments left 5/5     Right Shoulder Flexion 4/5    Right Shoulder External Rotation 4+/5    Right Shoulder Horizontal ABduction 4+/5      Palpation   Palpation comment spasmin in right upper trap and into right cervical parapsinals       Transfers   Comments useshands to transfer sit to stand, 5x sit to stand test 20 sec      Ambulation/Gait   Gait Comments flexed trunk, forward hread, Leans on 3 wheeled walker; decreased ilateral hip flexion and streide lngth       High Level Balance   High Level Balance Comments tandem snace mx a to maintain; narrow base min a narrow base eyes closed mod a                       Objective measurements completed on examination: See above findings.               PT Education - 01/10/20 2979    Education Details reviewed POC and how to develope balance    Person(s) Educated Patient    Methods Explanation;Demonstration;Tactile cues    Comprehension Verbalized understanding;Returned demonstration;Verbal cues required;Tactile cues required            PT Short Term Goals - 01/09/20 1203      PT SHORT TERM GOAL #1   Title Therapy will review FOTO goals    Time 3    Period Weeks    Status New    Target Date 01/30/20      PT SHORT TERM GOAL #2   Title Therapy will perfrom BERG balance scale and  set goal accordingly    Time 3    Period Weeks    Status New    Target Date 01/30/20      PT SHORT TERM GOAL #3   Title Patient will requie CGA for narrow base balance    Time 3    Period Weeks    Status New    Target Date 01/30/20      PT SHORT TERM GOAL #4   Title Patient will reduce 5x sit to stand by 6 seconds    Time 3    Period Weeks    Status New    Target Date 01/30/20             PT Long Term Goals - 01/09/20 1207      PT LONG TERM GOAL #1   Title Patient will demonstrate a 35% limitation on FOTO    Time 3    Period Weeks    Status New    Target Date 01/30/20      PT LONG TERM GOAL #  2   Title Patient will demonstrate a <10 second sit to stand test to demonstrate decreased falls risk    Time 6    Period Weeks    Status New    Target Date 02/20/20      PT LONG TERM GOAL #3   Title Patient will demonstrate a relaistic improvement in BERG testing to show a reduced falls risk ( level to be determined next visit)    Time 6    Period Weeks    Status New    Target Date 02/20/20                  Plan - 01/09/20 1211    Clinical Impression Statement Patient is an 85 year old female who presents with frequent falls and right sided cervical/ upper trap pain. She has decrease balance with static stance activity and with transfers. Therapy will complete BERG balance assessment next visit. Her other measures put her at a high fall risk. She has had pain in her upper trap and cerivcal spine since she had her lumbar MRI on 12/24/2019. She has decreased cervical right rotation and decreased cerivcal rotation. She has rounded shoulders, increased kyphphosis, and a forward head in sitting. Her cervical pain likley has a postural component. At this time she will be having a MRI on her thoracic spine to assess the sevarity of a suspected throacic compression fracture. If cleared by MD we will request a prescription to work on her neck as well. She owuld benefit from  skilled therapy to improve balance, decrease fall risk , and to improve cervical pain and motion.    Personal Factors and Comorbidities Comorbidity 1;Comorbidity 2;Comorbidity 3+    Comorbidities osteoperosis, anxiety, thoracic compression fracture    Examination-Activity Limitations Bend;Caring for Others;Carry;Dressing;Lift;Stand;Stairs;Squat;Reach Overhead;Locomotion Level    Examination-Participation Restrictions Cleaning;Meal Prep;Community Activity;Laundry;Shop    Stability/Clinical Decision Making Evolving/Moderate complexity    Clinical Decision Making Moderate    Rehab Potential Good    PT Frequency 2x / week    PT Duration 6 weeks    PT Treatment/Interventions ADLs/Self Care Home Management;Cryotherapy;Moist Heat;DME Instruction;Gait training;Stair training;Functional mobility training;Therapeutic activities;Therapeutic exercise;Neuromuscular re-education;Patient/family education;Manual techniques;Passive range of motion;Dry needling;Taping    PT Next Visit Plan BERG balance test, review FOTO, walking speed test, give basic HEP for LE strengthening; consider soft tissu mobilization of her neck for pain.    PT Home Exercise Plan ran out of time. Patient through in her explanation of details of her case.    Consulted and Agree with Plan of Care Patient           Patient will benefit from skilled therapeutic intervention in order to improve the following deficits and impairments:  Abnormal gait, Decreased range of motion, Increased fascial restricitons, Pain, Postural dysfunction, Decreased activity tolerance, Improper body mechanics, Impaired perceived functional ability, Difficulty walking, Decreased endurance, Decreased strength, Decreased safety awareness  Visit Diagnosis: Frequent falls  Abnormal posture  Cervicalgia     Problem List Patient Active Problem List   Diagnosis Date Noted  . Recurrent falls 11/16/2019  . Hypotension 11/15/2018  . Left shoulder pain  02/08/2018  . History of bladder cancer 02/08/2018  . Anemia, chronic disease 07/27/2017  . Weight loss 11/19/2015  . Chronic constipation 11/19/2015  . Other specified hypothyroidism 11/14/2015  . DJD (degenerative joint disease) of knee 06/19/2013  . Right knee DJD   . Hyperlipidemia   . Thyroid disease   . Chest pain 12/26/2012  . Unstable  angina ([place]) 12/03/2012  . Osteoporosis 12/03/2012  . Palpitations 12/03/2012  . Dyslipidemia 12/03/2012  . History of breast cancer in female 07/06/2011  . History of non-Hodgkin's lymphoma 07/06/2011  . Hypothyroid 07/06/2011  . Chronic ITP (idiopathic thrombocytopenia) (HCC) 07/06/2011    Carney Living  PT DPT  01/10/2020, 8:28 AM  [facility] [address] [place], [place], [address] Phone: [phone]   Fax:  [phone]  Name: Darlene [name] MRN: [id number] Date of Birth: August 29, 1934

## 2020-01-19 ENCOUNTER — Other Ambulatory Visit: Payer: Self-pay

## 2020-01-19 ENCOUNTER — Ambulatory Visit: Payer: Medicare Other

## 2020-01-19 DIAGNOSIS — M542 Cervicalgia: Secondary | ICD-10-CM

## 2020-01-19 DIAGNOSIS — R293 Abnormal posture: Secondary | ICD-10-CM

## 2020-01-19 DIAGNOSIS — R296 Repeated falls: Secondary | ICD-10-CM | POA: Diagnosis not present

## 2020-01-19 NOTE — Therapy (Signed)
[place], [place], [address] Phone: [phone]   Fax:  [phone]  Physical Therapy Treatment  Patient Details  Name: Darlene [name] MRN: [id number] Date of Birth: [birth year] Referring Provider (PT): Dr Narda Amber    Encounter Date: 01/19/2020   PT End of Session - 01/19/20 1159    Visit Number 2    Number of Visits 16    Date for PT Re-Evaluation 03/06/20    Authorization Type Medicare    PT Start Time 1150    PT Stop Time 1230    PT Time Calculation (min) 40 min    Equipment Utilized During Treatment Gait belt    Activity Tolerance Patient tolerated treatment well    Behavior During Therapy [facility] for tasks assessed/performed           Past Medical History:  Diagnosis Date  . Anxiety   . Breast cancer, stage 1, estrogen receptor negative ([place]) 07/06/2011  . Cancer of breast ([place])   . Cataract   . Chronic ITP (idiopathic thrombocytopenia) (HCC) 07/06/2011  . Constipation    takes Colace daily  . COPD (chronic obstructive pulmonary disease) (HCC)    mild  . Depression    "nervous break down age 67"  . Fast heart beat   . GERD (gastroesophageal reflux disease)    takes Nexium daily  . History of colon polyps   . History of depression    received shock treatments   . History of palpitations    takes Metoprolol daily  . History of vertigo   . HOH (hard of hearing)    has hearing aids  . Hyperlipidemia    takes Lovastatin daily  . Hypertension   . Hypothyroid 07/06/2011   takes SYnthroid daily  . Joint pain   . Joint swelling   . Lymphoma ([place])   . Lymphoma ([place])    B cell   . Lymphoma of lymph nodes of head, face, and/or neck ([place]) 07/06/2011  . Neuropathy   . Nocturia   . Osteopenia   . Osteoporosis    hx of and take Reclast  . Peripheral neuropathy   . Personal history of chemotherapy   . Personal history of radiation therapy   . Right knee DJD   . Shortness of breath dyspnea     with exertion  . Thyroid disease   . Umbilical hernia     Past Surgical History:  Procedure Laterality Date  . ADENOIDECTOMY     as a child  . BREAST LUMPECTOMY Right 1994  . BREAST SURGERY Right   . BUNIONECTOMY Right   . CARDIAC CATHETERIZATION  2014  . COLONOSCOPY    . FOOT SURGERY Right   . FRACTURE SURGERY     wrist right , right knee  . INSERTION OF MESH N/A 04/17/2015   Procedure: INSERTION OF MESH;  Surgeon: Ralene Ok, MD;  Location: [place];  Service: General;  Laterality: N/A;  . KNEE SURGERY    . LEFT HEART CATHETERIZATION WITH CORONARY ANGIOGRAM N/A 12/05/2012   Procedure: LEFT HEART CATHETERIZATION WITH CORONARY ANGIOGRAM;  Surgeon: [name] Sine, MD;  Location: [facility] CATH LAB;  Service: Cardiovascular;  Laterality: N/A;  . right  breast surgery     d/t breast cancer  . TONSILLECTOMY     as a child  . TOTAL KNEE ARTHROPLASTY Right 06/19/2013   Procedure: TOTAL KNEE ARTHROPLASTY;  Surgeon: Lorn Junes, MD;  Location: [place];  Service: Orthopedics;  Laterality: Right;  . TRANSURETHRAL RESECTION OF BLADDER TUMOR WITH GYRUS (TURBT-GYRUS)     01-26-18 Dr. Louis Meckel  . TRANSURETHRAL RESECTION OF BLADDER TUMOR WITH MITOMYCIN-C Bilateral 01/26/2018   Procedure: TRANSURETHRAL RESECTION OF BLADDER TUMOR WITH GEMCITABIN BLLATERAL RETROGRADE PYELOGRAM;  Surgeon: Ardis Hughs, MD;  [facility];  Service: Urology;  Laterality: Bilateral;  . TURBINATE REDUCTION    . UMBILICAL HERNIA REPAIR N/A 04/17/2015   Procedure: LAPAROSCOPIC UMBILICAL HERNIA REPAIR WITH MESH;  Surgeon: Ralene Ok, MD;  Location: [place];  Service: General;  Laterality: N/A;  . UPPER GASTROINTESTINAL ENDOSCOPY      There were no vitals filed for this visit.   Subjective Assessment - 01/19/20 1158    Subjective Pt reports the right side of her neck is painful today. She took 2 Tylenol before she came today. She goes for her MRI tomorrow.    Pertinent History osteoperosis, HTN, brest cancer,  depressio, lymphoma, osteopenia,    Limitations Standing;Walking    How long can you walk comfortably? Has to walk with the device. Can walk around the store if needed    Diagnostic tests Lumbar MRI: L2-L5 multi level degenration    Patient Stated Goals to have less and falls and less pain in her neck    Currently in Pain? Yes    Pain Score 5     Pain Location Neck                             [facility] Adult PT Treatment/Exercise - 01/19/20 0001      Standardized Balance Assessment   Standardized Balance Assessment Berg Balance Test      Berg Balance Test   Sit to Stand Able to stand  independently using hands    Standing Unsupported Able to stand 2 minutes with supervision    Sitting with Back Unsupported but Feet Supported on Floor or Stool Able to sit safely and securely 2 minutes    Stand to Sit Controls descent by using hands    Transfers Able to transfer with verbal cueing and /or supervision    Standing Unsupported with Eyes Closed Able to stand 10 seconds with supervision    Standing Ubsupported with Feet Together Able to place feet together independently and stand for 1 minute with supervision    From Standing, Reach Forward with Outstretched Arm Can reach forward >5 cm safely (2")    From Standing Position, Pick up Object from [place] to pick up shoe, needs supervision   sig incr time and uses opp UE on chair for support   From Standing Position, Turn to Look Behind Over each Shoulder Looks behind one side only/other side shows less weight shift    Turn 360 Degrees Needs close supervision or verbal cueing    Standing Unsupported, Alternately Place Feet on Step/Stool Needs assistance to keep from falling or unable to try    Standing Unsupported, One Foot in [employer] balance while stepping or standing    Standing on One Leg Tries to lift leg/unable to hold 3 seconds but remains standing independently    Total Score 31      Exercises   Exercises Knee/Hip        Knee/Hip Exercises: Standing   Heel Raises Both;1 set;10 reps    Heel Raises Limitations heel-toe behind bari chair      Knee/Hip Exercises: Supine   Straight Leg Raises Strengthening;Both;1 set;10  reps    Other Supine Knee/Hip Exercises PPT + march x 10 B                     PT Short Term Goals - 01/09/20 1203      PT SHORT TERM GOAL #1   Title Therapy will review FOTO goals    Time 3    Period Weeks    Status New    Target Date 01/30/20      PT SHORT TERM GOAL #2   Title Therapy will perfrom BERG balance scale and set goal accordingly    Time 3    Period Weeks    Status New    Target Date 01/30/20      PT SHORT TERM GOAL #3   Title Patient will requie CGA for narrow base balance    Time 3    Period Weeks    Status New    Target Date 01/30/20      PT SHORT TERM GOAL #4   Title Patient will reduce 5x sit to stand by 6 seconds    Time 3    Period Weeks    Status New    Target Date 01/30/20             PT Long Term Goals - 01/09/20 1207      PT LONG TERM GOAL #1   Title Patient will demonstrate a 35% limitation on FOTO    Time 3    Period Weeks    Status New    Target Date 01/30/20      PT LONG TERM GOAL #2   Title Patient will demonstrate a <10 second sit to stand test to demonstrate decreased falls risk    Time 6    Period Weeks    Status New    Target Date 02/20/20      PT LONG TERM GOAL #3   Title Patient will demonstrate a relaistic improvement in BERG testing to show a reduced falls risk ( level to be determined next visit)    Time 6    Period Weeks    Status New    Target Date 02/20/20                 Plan - 01/19/20 1200    Clinical Impression Statement Pt performed BERG with 31/56 score, representing significant fall risk. Educated pt to use her reacher at home, if she drops anything to reduce fall risk. Additionally educated pt her walker does not lock, which is a fall hazard. Pt reported she and her daughter will  be looking for a new one that locks. Pt given LE strengthening program, but due to pt being hyper verbal, unable to assess gait speed today.    Personal Factors and Comorbidities Comorbidity 1;Comorbidity 2;Comorbidity 3+    Comorbidities osteoperosis, anxiety, thoracic compression fracture    Examination-Activity Limitations Bend;Caring for Others;Carry;Dressing;Lift;Stand;Stairs;Squat;Reach Overhead;Locomotion Level    Examination-Participation Restrictions Cleaning;Meal Prep;Community Activity;Laundry;Shop    Stability/Clinical Decision Making Evolving/Moderate complexity    Rehab Potential Good    PT Frequency 2x / week    PT Duration 6 weeks    PT Treatment/Interventions ADLs/Self Care Home Management;Cryotherapy;Moist Heat;DME Instruction;Gait training;Stair training;Functional mobility training;Therapeutic activities;Therapeutic exercise;Neuromuscular re-education;Patient/family education;Manual techniques;Passive range of motion;Dry needling;Taping    PT Next Visit Plan walking speed test; consider soft tissue mobilization of her neck for pain; static balance, LE strength    PT Home Exercise Plan Access Code [email or web address]: supine  SLR, PPT + march, standing heel raises, standing toe raises at counter with support    Consulted and Agree with Plan of Care Patient           Patient will benefit from skilled therapeutic intervention in order to improve the following deficits and impairments:  Abnormal gait, Decreased range of motion, Increased fascial restricitons, Pain, Postural dysfunction, Decreased activity tolerance, Improper body mechanics, Impaired perceived functional ability, Difficulty walking, Decreased endurance, Decreased strength, Decreased safety awareness  Visit Diagnosis: Frequent falls  Abnormal posture  Cervicalgia     Problem List Patient Active Problem List   Diagnosis Date Noted  . Recurrent falls 11/16/2019  . Hypotension 11/15/2018  . Left shoulder pain  02/08/2018  . History of bladder cancer 02/08/2018  . Anemia, chronic disease 07/27/2017  . Weight loss 11/19/2015  . Chronic constipation 11/19/2015  . Other specified hypothyroidism 11/14/2015  . DJD (degenerative joint disease) of knee 06/19/2013  . Right knee DJD   . Hyperlipidemia   . Thyroid disease   . Chest pain 12/26/2012  . Unstable angina ([place]) 12/03/2012  . Osteoporosis 12/03/2012  . Palpitations 12/03/2012  . Dyslipidemia 12/03/2012  . History of breast cancer in female 07/06/2011  . History of non-Hodgkin's lymphoma 07/06/2011  . Hypothyroid 07/06/2011  . Chronic ITP (idiopathic thrombocytopenia) (HCC) 07/06/2011    Izell Norwalk, PT, DPT 01/19/2020, 12:42 PM  [facility] [address] [place], [place], [address] Phone: [phone]   Fax:  [phone]  Name: Darlene [name] MRN: [id number] Date of Birth: 03/23/1935

## 2020-01-20 ENCOUNTER — Ambulatory Visit
Admission: RE | Admit: 2020-01-20 | Discharge: 2020-01-20 | Disposition: A | Discharge status: Home / Self Care | Payer: Medicare Other | Source: Ambulatory Visit | Attending: Neurology | Admitting: Neurology

## 2020-01-20 DIAGNOSIS — G959 Disease of spinal cord, unspecified: Secondary | ICD-10-CM

## 2020-01-20 DIAGNOSIS — M4804 Spinal stenosis, thoracic region: Secondary | ICD-10-CM | POA: Diagnosis not present

## 2020-01-20 DIAGNOSIS — S22079A Unspecified fracture of T9-T10 vertebra, initial encounter for closed fracture: Secondary | ICD-10-CM | POA: Diagnosis not present

## 2020-01-23 ENCOUNTER — Ambulatory Visit: Payer: Medicare Other | Admitting: Physical Therapy

## 2020-01-23 ENCOUNTER — Telehealth: Payer: Self-pay | Admitting: Neurology

## 2020-01-23 DIAGNOSIS — S22079A Unspecified fracture of T9-T10 vertebra, initial encounter for closed fracture: Secondary | ICD-10-CM

## 2020-01-23 NOTE — Telephone Encounter (Signed)
Referral and supporting documentation and images have been faxed to [facility]

## 2020-01-23 NOTE — Telephone Encounter (Signed)
Results of MRI thoracic spine discussed which shows severe T9 vertebral compression fracture.  She reports having a fall in December 2019 and has chronic low back pain since this time. There is mild spinal canal stenosis at this level.  I will refer her to spine specialist to see if there is a role for any intervention to stabilize this.  All questions answered.   Chrishon Martino K. Posey Pronto, DO

## 2020-01-27 ENCOUNTER — Ambulatory Visit: Payer: Medicare Other

## 2020-01-27 ENCOUNTER — Other Ambulatory Visit: Payer: Self-pay

## 2020-01-27 DIAGNOSIS — R296 Repeated falls: Secondary | ICD-10-CM | POA: Diagnosis not present

## 2020-01-27 DIAGNOSIS — M542 Cervicalgia: Secondary | ICD-10-CM

## 2020-01-27 DIAGNOSIS — R293 Abnormal posture: Secondary | ICD-10-CM

## 2020-01-27 NOTE — Therapy (Signed)
[place], [place], [address] Phone: [phone]   Fax:  [phone]  Physical Therapy Treatment  Patient Details  Name: Darlene [name] MRN: [id number] Date of Birth: 08-20-1934 Referring Provider (PT): Dr Narda Amber    Encounter Date: 01/27/2020   PT End of Session - 01/27/20 1238    Visit Number 3    Number of Visits 16    Date for PT Re-Evaluation 03/06/20    Authorization Type Medicare    PT Start Time 1116    PT Stop Time 1202    PT Time Calculation (min) 46 min    Equipment Utilized During Treatment Other (comment)   3 wheeled RW   Activity Tolerance Patient tolerated treatment well    Behavior During Therapy [facility] for tasks assessed/performed           Past Medical History:  Diagnosis Date  . Anxiety   . Breast cancer, stage 1, estrogen receptor negative ([place]) 07/06/2011  . Cancer of breast ([place])   . Cataract   . Chronic ITP (idiopathic thrombocytopenia) (HCC) 07/06/2011  . Constipation    takes Colace daily  . COPD (chronic obstructive pulmonary disease) (HCC)    mild  . Depression    "nervous break down age 53"  . Fast heart beat   . GERD (gastroesophageal reflux disease)    takes Nexium daily  . History of colon polyps   . History of depression    received shock treatments   . History of palpitations    takes Metoprolol daily  . History of vertigo   . HOH (hard of hearing)    has hearing aids  . Hyperlipidemia    takes Lovastatin daily  . Hypertension   . Hypothyroid 07/06/2011   takes SYnthroid daily  . Joint pain   . Joint swelling   . Lymphoma ([place])   . Lymphoma ([place])    B cell   . Lymphoma of lymph nodes of head, face, and/or neck ([place]) 07/06/2011  . Neuropathy   . Nocturia   . Osteopenia   . Osteoporosis    hx of and take Reclast  . Peripheral neuropathy   . Personal history of chemotherapy   . Personal history of radiation therapy   . Right knee DJD   .  Shortness of breath dyspnea    with exertion  . Thyroid disease   . Umbilical hernia     Past Surgical History:  Procedure Laterality Date  . ADENOIDECTOMY     as a child  . BREAST LUMPECTOMY Right 1994  . BREAST SURGERY Right   . BUNIONECTOMY Right   . CARDIAC CATHETERIZATION  2014  . COLONOSCOPY    . FOOT SURGERY Right   . FRACTURE SURGERY     wrist right , right knee  . INSERTION OF MESH N/A 04/17/2015   Procedure: INSERTION OF MESH;  Surgeon: Ralene Ok, MD;  Location: [place];  Service: General;  Laterality: N/A;  . KNEE SURGERY    . LEFT HEART CATHETERIZATION WITH CORONARY ANGIOGRAM N/A 12/05/2012   Procedure: LEFT HEART CATHETERIZATION WITH CORONARY ANGIOGRAM;  Surgeon: [name] Sine, MD;  Location: [facility] CATH LAB;  Service: Cardiovascular;  Laterality: N/A;  . right  breast surgery     d/t breast cancer  . TONSILLECTOMY     as a child  . TOTAL KNEE ARTHROPLASTY Right 06/19/2013   Procedure: TOTAL KNEE ARTHROPLASTY;  Surgeon: Lorn Junes, MD;  Location: [place];  Service: Orthopedics;  Laterality: Right;  . TRANSURETHRAL RESECTION OF BLADDER TUMOR WITH GYRUS (TURBT-GYRUS)     01-26-18 Dr. Louis Meckel  . TRANSURETHRAL RESECTION OF BLADDER TUMOR WITH MITOMYCIN-C Bilateral 01/26/2018   Procedure: TRANSURETHRAL RESECTION OF BLADDER TUMOR WITH GEMCITABIN BLLATERAL RETROGRADE PYELOGRAM;  Surgeon: Ardis Hughs, MD;  Location: [facility];  Service: Urology;  Laterality: Bilateral;  . TURBINATE REDUCTION    . UMBILICAL HERNIA REPAIR N/A 04/17/2015   Procedure: LAPAROSCOPIC UMBILICAL HERNIA REPAIR WITH MESH;  Surgeon: Ralene Ok, MD;  Location: [place];  Service: General;  Laterality: N/A;  . UPPER GASTROINTESTINAL ENDOSCOPY      There were no vitals filed for this visit.   Subjective Assessment - 01/27/20 1141    Subjective Pt reports her mid back and and her neck are not currently hurting her.    Pertinent History osteoperosis, HTN, brest cancer, depressio, lymphoma,  osteopenia,    Diagnostic tests 1. No acute abnormality of the thoracic spine.2. T9 vertebra plana with 6 mm retropulsion causing mild spinal canal stenosis.Lumbar MRI: L2-L5 multi level degenration    Patient Stated Goals to have less and falls and less pain in her neck    Currently in Pain? No/denies    Pain Score 5     Pain Location Neck                             [facility] Adult PT Treatment/Exercise - 01/27/20 0001      Transfers   Comments uses hands to transfer sit to stand      Exercises   Exercises Knee/Hip      Knee/Hip Exercises: Aerobic   Nustep 6 mins; L3; arms and legs      Knee/Hip Exercises: Seated   Long Arc Quad Strengthening;Both;2 sets;10 reps    Long Arc Quad Weight 2 lbs.    Ball Squeeze 10x2; 3sec    Clamshell with TheraBand Red   10x2   Marching Strengthening;Right;Left;2 sets;10 reps    Marching Weights 2 lbs.    Sit to Sand 10 reps;with UE support   to 3 wheeled RW                 PT Education - 01/27/20 1149    Education Details HEP. Fall prevention education for proper lighting in home and feeling the sitting surface on the back of her legs prior to sitting.    Person(s) Educated Patient    Methods Explanation;Demonstration;Tactile cues;Verbal cues;Handout    Comprehension Verbalized understanding;Returned demonstration;Verbal cues required;Need further instruction;Tactile cues required            PT Short Term Goals - 01/09/20 1203      PT SHORT TERM GOAL #1   Title Therapy will review FOTO goals    Time 3    Period Weeks    Status New    Target Date 01/30/20      PT SHORT TERM GOAL #2   Title Therapy will perfrom BERG balance scale and set goal accordingly    Time 3    Period Weeks    Status New    Target Date 01/30/20      PT SHORT TERM GOAL #3   Title Patient will requie CGA for narrow base balance    Time 3    Period Weeks    Status New    Target Date 01/30/20      PT SHORT  TERM GOAL #4   Title  Patient will reduce 5x sit to stand by 6 seconds    Time 3    Period Weeks    Status New    Target Date 01/30/20             PT Long Term Goals - 01/09/20 1207      PT LONG TERM GOAL #1   Title Patient will demonstrate a 35% limitation on FOTO    Time 3    Period Weeks    Status New    Target Date 01/30/20      PT LONG TERM GOAL #2   Title Patient will demonstrate a <10 second sit to stand test to demonstrate decreased falls risk    Time 6    Period Weeks    Status New    Target Date 02/20/20      PT LONG TERM GOAL #3   Title Patient will demonstrate a relaistic improvement in BERG testing to show a reduced falls risk ( level to be determined next visit)    Time 6    Period Weeks    Status New    Target Date 02/20/20                 Plan - 01/27/20 1250    Clinical Impression Statement Results for throracic MRi: 1. No acute abnormality of the thoracic spine.2. T9 vertebra plana with 6 mm retropulsion causing mild spinalcanal stenosis. Recommended pt contacting Dr. Posey Pronto for referral for her neck. Pt stated she would call. PT focused on LE strengthening and education for fal prevention. Pt needed frequent redirection to complete exs Pt is pleasant and very talkative. Pt will benefit form PT for strengthening and balance to improve safety with functional mobility.    Personal Factors and Comorbidities Comorbidity 1;Comorbidity 2;Comorbidity 3+    Comorbidities osteoperosis, anxiety, thoracic compression fracture    Examination-Activity Limitations Bend;Caring for Others;Carry;Dressing;Lift;Stand;Stairs;Squat;Reach Overhead;Locomotion Level    Examination-Participation Restrictions Cleaning;Meal Prep;Community Activity;Laundry;Shop    Stability/Clinical Decision Making Evolving/Moderate complexity    Clinical Decision Making Moderate    Rehab Potential Good    PT Frequency 2x / week    PT Duration 6 weeks    PT Treatment/Interventions ADLs/Self Care Home  Management;Cryotherapy;Moist Heat;DME Instruction;Gait training;Stair training;Functional mobility training;Therapeutic activities;Therapeutic exercise;Neuromuscular re-education;Patient/family education;Manual techniques;Passive range of motion;Dry needling;Taping    PT Next Visit Plan Address cervical pain as referral is received from Dr. Posey Pronto.    PT Home Exercise Plan Access Code [email or web address]: supine SLR, PPT + march, standing heel raises, standing toe raises at counter with support. Sitting exs for LAQ, marching, hip add sets, and hip abd c Tband.    Consulted and Agree with Plan of Care Patient           Patient will benefit from skilled therapeutic intervention in order to improve the following deficits and impairments:  Abnormal gait, Decreased range of motion, Increased fascial restricitons, Pain, Postural dysfunction, Decreased activity tolerance, Improper body mechanics, Impaired perceived functional ability, Difficulty walking, Decreased endurance, Decreased strength, Decreased safety awareness  Visit Diagnosis: Frequent falls  Abnormal posture  Cervicalgia     Problem List Patient Active Problem List   Diagnosis Date Noted  . Recurrent falls 11/16/2019  . Hypotension 11/15/2018  . Left shoulder pain 02/08/2018  . History of bladder cancer 02/08/2018  . Anemia, chronic disease 07/27/2017  . Weight loss 11/19/2015  . Chronic constipation 11/19/2015  . Other specified hypothyroidism 11/14/2015  .  DJD (degenerative joint disease) of knee 06/19/2013  . Right knee DJD   . Hyperlipidemia   . Thyroid disease   . Chest pain 12/26/2012  . Unstable angina ([place]) 12/03/2012  . Osteoporosis 12/03/2012  . Palpitations 12/03/2012  . Dyslipidemia 12/03/2012  . History of breast cancer in female 07/06/2011  . History of non-Hodgkin's lymphoma 07/06/2011  . Hypothyroid 07/06/2011  . Chronic ITP (idiopathic thrombocytopenia) (HCC) 07/06/2011   Gar Ponto MS, PT 01/27/20 1:02  PM  [facility] [address] [place], [place], [address] Phone: [phone]   Fax:  [phone]  Name: Darlene [name] MRN: [id number] Date of Birth: 08/02/1934

## 2020-01-29 ENCOUNTER — Telehealth: Payer: Self-pay

## 2020-01-29 ENCOUNTER — Telehealth: Payer: Self-pay | Admitting: Neurology

## 2020-01-29 ENCOUNTER — Ambulatory Visit: Payer: Medicare Other | Admitting: Physical Therapy

## 2020-01-29 ENCOUNTER — Other Ambulatory Visit: Payer: Self-pay

## 2020-01-29 DIAGNOSIS — M542 Cervicalgia: Secondary | ICD-10-CM

## 2020-01-29 DIAGNOSIS — R296 Repeated falls: Secondary | ICD-10-CM

## 2020-01-29 DIAGNOSIS — R293 Abnormal posture: Secondary | ICD-10-CM

## 2020-01-29 NOTE — Telephone Encounter (Signed)
OK to send for neck PT.

## 2020-01-29 NOTE — Addendum Note (Signed)
Addended by: Armen Pickup A on: 01/29/2020 03:07 PM   Modules accepted: Orders

## 2020-01-29 NOTE — Telephone Encounter (Signed)
Referral has been sent for neck PT. Called patient and left message for a call back to inform her that referral was sent.

## 2020-01-29 NOTE — Telephone Encounter (Signed)
Patient requesting a referral for PT for her neck.

## 2020-01-30 ENCOUNTER — Other Ambulatory Visit: Payer: Self-pay

## 2020-01-30 ENCOUNTER — Encounter: Payer: Self-pay | Admitting: Physical Therapy

## 2020-01-30 NOTE — Telephone Encounter (Signed)
Patient returned call through Access Nurse. Called patient back and left a message on answering machine that PT referral was sent and if any questions to please contact our office.

## 2020-01-30 NOTE — Therapy (Signed)
[place], [place], [address] Phone: [phone]   Fax:  [phone]  Physical Therapy Treatment  Patient Details  Name: Darlene [name] MRN: [id number] Date of Birth: [birth year] Referring Provider (PT): Dr Narda Amber    Encounter Date: 01/29/2020   PT End of Session - 01/30/20 0835    Visit Number 4    Number of Visits 16    Date for PT Re-Evaluation 03/06/20    Authorization Type Medicare    PT Start Time 1330    PT Stop Time 1413    PT Time Calculation (min) 43 min    Activity Tolerance Patient tolerated treatment well    Behavior During Therapy [facility] for tasks assessed/performed           Past Medical History:  Diagnosis Date  . Anxiety   . Breast cancer, stage 1, estrogen receptor negative ([place]) 07/06/2011  . Cancer of breast ([place])   . Cataract   . Chronic ITP (idiopathic thrombocytopenia) (HCC) 07/06/2011  . Constipation    takes Colace daily  . COPD (chronic obstructive pulmonary disease) (HCC)    mild  . Depression    "nervous break down age 77"  . Fast heart beat   . GERD (gastroesophageal reflux disease)    takes Nexium daily  . History of colon polyps   . History of depression    received shock treatments   . History of palpitations    takes Metoprolol daily  . History of vertigo   . HOH (hard of hearing)    has hearing aids  . Hyperlipidemia    takes Lovastatin daily  . Hypertension   . Hypothyroid 07/06/2011   takes SYnthroid daily  . Joint pain   . Joint swelling   . Lymphoma ([place])   . Lymphoma ([place])    B cell   . Lymphoma of lymph nodes of head, face, and/or neck ([place]) 07/06/2011  . Neuropathy   . Nocturia   . Osteopenia   . Osteoporosis    hx of and take Reclast  . Peripheral neuropathy   . Personal history of chemotherapy   . Personal history of radiation therapy   . Right knee DJD   . Shortness of breath dyspnea    with exertion  . Thyroid disease   .  Umbilical hernia     Past Surgical History:  Procedure Laterality Date  . ADENOIDECTOMY     as a child  . BREAST LUMPECTOMY Right 1994  . BREAST SURGERY Right   . BUNIONECTOMY Right   . CARDIAC CATHETERIZATION  2014  . COLONOSCOPY    . FOOT SURGERY Right   . FRACTURE SURGERY     wrist right , right knee  . INSERTION OF MESH N/A 04/17/2015   Procedure: INSERTION OF MESH;  Surgeon: Ralene Ok, MD;  Location: [facility];  Service: General;  Laterality: N/A;  . KNEE SURGERY    . LEFT HEART CATHETERIZATION WITH CORONARY ANGIOGRAM N/A 12/05/2012   Procedure: LEFT HEART CATHETERIZATION WITH CORONARY ANGIOGRAM;  Surgeon: [name] Sine, MD;  Location: [facility] CATH LAB;  Service: Cardiovascular;  Laterality: N/A;  . right  breast surgery     d/t breast cancer  . TONSILLECTOMY     as a child  . TOTAL KNEE ARTHROPLASTY Right 06/19/2013   Procedure: TOTAL KNEE ARTHROPLASTY;  Surgeon: Lorn Junes, MD;  Location: [place];  Service: Orthopedics;  Laterality: Right;  . TRANSURETHRAL RESECTION  OF BLADDER TUMOR WITH GYRUS (TURBT-GYRUS)     01-26-18 Dr. Louis Meckel  . TRANSURETHRAL RESECTION OF BLADDER TUMOR WITH MITOMYCIN-C Bilateral 01/26/2018   Procedure: TRANSURETHRAL RESECTION OF BLADDER TUMOR WITH GEMCITABIN BLLATERAL RETROGRADE PYELOGRAM;  Surgeon: Ardis Hughs, MD;  [facility];  Service: Urology;  Laterality: Bilateral;  . TURBINATE REDUCTION    . UMBILICAL HERNIA REPAIR N/A 04/17/2015   Procedure: LAPAROSCOPIC UMBILICAL HERNIA REPAIR WITH MESH;  Surgeon: Ralene Ok, MD;  Location: [place];  Service: General;  Laterality: N/A;  . UPPER GASTROINTESTINAL ENDOSCOPY      There were no vitals filed for this visit.   Subjective Assessment - 01/30/20 0832    Subjective Patient continues to report neck pain. She has not had any falls since her last visit.. She is to schedule an appointment with neuro-surgery.    Pertinent History osteoperosis, HTN, brest cancer, depressio, lymphoma,  osteopenia,    How long can you walk comfortably? Has to walk with the device. Can walk around the store if needed    Diagnostic tests 1. No acute abnormality of the thoracic spine.2. T9 vertebra plana with 6 mm retropulsion causing mild spinal canal stenosis.Lumbar MRI: L2-L5 multi level degenration    Patient Stated Goals to have less and falls and less pain in her neck    Currently in Pain? Yes    Pain Score 5     Pain Location Neck    Pain Orientation Right    Pain Descriptors / Indicators Aching    Pain Onset More than a month ago    Pain Frequency Constant    Aggravating Factors  turning her head    Pain Relieving Factors rest    Effect of Pain on Daily Activities difficulty perfroming ADL's;                             [facility] Adult PT Treatment/Exercise - 01/30/20 0001      Therapeutic Activites    Therapeutic Activities Other Therapeutic Activities    Other Therapeutic Activities worked on technique with sit to stand transfers       Neuro Re-ed    Neuro Re-ed Details  rocking back, mod a to catch balance x20; narrow base of support 30 sec hold min gaurd eyes opened 3x30 sec eyes cloised 3x30 sec hold.       Knee/Hip Exercises: Seated   Long Arc Quad Strengthening;Both;2 sets;10 reps    Long Arc Quad Weight 2 lbs.    Ball Squeeze 10x2; 3sec    Clamshell with TheraBand Red   10x2   Marching Strengthening;Right;Left;2 sets;10 reps    Marching Weights 2 lbs.    Sit to Sand --   3x5 with cuing for posture. Able to do with hands on thighs      Manual Therapy   Manual Therapy Soft tissue mobilization    Soft tissue mobilization Performed to upper trap                   PT Education - 01/30/20 0835    Education Details reviewed HEP and symptom management    Person(s) Educated Patient    Methods Explanation;Demonstration;Tactile cues;Verbal cues    Comprehension Verbalized understanding;Returned demonstration;Verbal cues required;Tactile cues  required            PT Short Term Goals - 01/30/20 0844      PT SHORT TERM GOAL #1   Title Therapy will  review FOTO goals    Baseline reviewed with patient    Time 3    Period Weeks    Status Achieved      PT SHORT TERM GOAL #2   Title Therapy will perfrom BERG balance scale and set goal accordingly    Baseline perfromed BERG    Time 3    Period Weeks    Status Achieved    Target Date 01/30/20      PT SHORT TERM GOAL #3   Title Patient will requie CGA for narrow base balance    Time 3    Period Weeks    Status On-going    Target Date 01/30/20      PT SHORT TERM GOAL #4   Title Patient will reduce 5x sit to stand by 6 seconds    Baseline worked on sit to stand today. Patient did well. Not formally tested    Time 3    Period Weeks    Status On-going    Target Date 01/30/20             PT Long Term Goals - 01/09/20 1207      PT LONG TERM GOAL #1   Title Patient will demonstrate a 35% limitation on FOTO    Time 3    Period Weeks    Status New    Target Date 01/30/20      PT LONG TERM GOAL #2   Title Patient will demonstrate a <10 second sit to stand test to demonstrate decreased falls risk    Time 6    Period Weeks    Status New    Target Date 02/20/20      PT LONG TERM GOAL #3   Title Patient will demonstrate a relaistic improvement in BERG testing to show a reduced falls risk ( level to be determined next visit)    Time 6    Period Weeks    Status New    Target Date 02/20/20                 Plan - 01/29/20 1443    Clinical Impression Statement Therapy has been advised by MD to hold on neck until her thoracic compression fracture is evaluated by neuro surgeon. For now we will continue to focus on balance and LE strengthening. Therapy added balance exercises today. She required mod a to correct posterior balance. She required mod a with eyes closed balance. she wads advised to be aware that her balance at night will lilley be limited. We will  continue to train balance.    Personal Factors and Comorbidities Comorbidity 1;Comorbidity 2;Comorbidity 3+    Comorbidities osteoperosis, anxiety, thoracic compression fracture    Examination-Activity Limitations Bend;Caring for Others;Carry;Dressing;Lift;Stand;Stairs;Squat;Reach Overhead;Locomotion Level    Examination-Participation Restrictions Cleaning;Meal Prep;Community Activity;Laundry;Shop    Stability/Clinical Decision Making Evolving/Moderate complexity    Clinical Decision Making Moderate    Rehab Potential Good    PT Frequency 2x / week    PT Duration 6 weeks    PT Treatment/Interventions ADLs/Self Care Home Management;Cryotherapy;Moist Heat;DME Instruction;Gait training;Stair training;Functional mobility training;Therapeutic activities;Therapeutic exercise;Neuromuscular re-education;Patient/family education;Manual techniques;Passive range of motion;Dry needling;Taping    PT Next Visit Plan continue to work on balance and strengthening exercises. New script recieved for cervical spine. Proceed with eval of the neck but be aware of thoracic compression fx. She has not been seen by neuro surgeon yet.    PT Home Exercise Plan Access Code [email or web address]: supine SLR, PPT + march,  standing heel raises, standing toe raises at counter with support. Sitting exs for LAQ, marching, hip add sets, and hip abd c Tband.    Consulted and Agree with Plan of Care Patient           Patient will benefit from skilled therapeutic intervention in order to improve the following deficits and impairments:  Abnormal gait, Decreased range of motion, Increased fascial restricitons, Pain, Postural dysfunction, Decreased activity tolerance, Improper body mechanics, Impaired perceived functional ability, Difficulty walking, Decreased endurance, Decreased strength, Decreased safety awareness  Visit Diagnosis: Frequent falls  Abnormal posture  Cervicalgia     Problem List Patient Active Problem List   Diagnosis  Date Noted  . Recurrent falls 11/16/2019  . Hypotension 11/15/2018  . Left shoulder pain 02/08/2018  . History of bladder cancer 02/08/2018  . Anemia, chronic disease 07/27/2017  . Weight loss 11/19/2015  . Chronic constipation 11/19/2015  . Other specified hypothyroidism 11/14/2015  . DJD (degenerative joint disease) of knee 06/19/2013  . Right knee DJD   . Hyperlipidemia   . Thyroid disease   . Chest pain 12/26/2012  . Unstable angina ([place]) 12/03/2012  . Osteoporosis 12/03/2012  . Palpitations 12/03/2012  . Dyslipidemia 12/03/2012  . History of breast cancer in female 07/06/2011  . History of non-Hodgkin's lymphoma 07/06/2011  . Hypothyroid 07/06/2011  . Chronic ITP (idiopathic thrombocytopenia) (HCC) 07/06/2011    Carney Living PT DPT  01/30/2020, 8:49 AM  [facility] [address] [place], [place], [address] Phone: [phone]   Fax:  [phone]  Name: SUKI CROCKETT MRN: [id number] Date of Birth: 02-09-1935

## 2020-02-02 ENCOUNTER — Ambulatory Visit: Payer: Medicare Other | Attending: Neurology

## 2020-02-02 ENCOUNTER — Other Ambulatory Visit: Payer: Self-pay

## 2020-02-02 DIAGNOSIS — R293 Abnormal posture: Secondary | ICD-10-CM | POA: Insufficient documentation

## 2020-02-02 DIAGNOSIS — R296 Repeated falls: Secondary | ICD-10-CM | POA: Diagnosis not present

## 2020-02-02 DIAGNOSIS — M542 Cervicalgia: Secondary | ICD-10-CM | POA: Diagnosis not present

## 2020-02-02 NOTE — Therapy (Signed)
[place], [place], [address] Phone: [phone]   Fax:  [phone]  Physical Therapy Treatment  Patient Details  Name: Darlene [name] MRN: [id number] Date of Birth: 1934/10/08 Referring Provider (PT): Dr Narda Amber    Encounter Date: 02/02/2020   PT End of Session - 02/02/20 1158    Visit Number 5    Number of Visits 16    Date for PT Re-Evaluation 03/06/20    Authorization Type Medicare    PT Start Time 1154   pt arrived late   PT Stop Time 1230    PT Time Calculation (min) 36 min    Equipment Utilized During Treatment Other (comment);Gait belt    Activity Tolerance Patient tolerated treatment well    Behavior During Therapy [facility] for tasks assessed/performed           Past Medical History:  Diagnosis Date  . Anxiety   . Breast cancer, stage 1, estrogen receptor negative ([place]) 07/06/2011  . Cancer of breast ([place])   . Cataract   . Chronic ITP (idiopathic thrombocytopenia) ([facility]) 07/06/2011  . Constipation    takes Colace daily  . COPD (chronic obstructive pulmonary disease) ([facility])    mild  . Depression    "nervous break down age 71"  . Fast heart beat   . GERD (gastroesophageal reflux disease)    takes Nexium daily  . History of colon polyps   . History of depression    received shock treatments   . History of palpitations    takes Metoprolol daily  . History of vertigo   . HOH (hard of hearing)    has hearing aids  . Hyperlipidemia    takes Lovastatin daily  . Hypertension   . Hypothyroid 07/06/2011   takes SYnthroid daily  . Joint pain   . Joint swelling   . Lymphoma ([place])   . Lymphoma ([place])    B cell   . Lymphoma of lymph nodes of head, face, and/or neck ([place]) 07/06/2011  . Neuropathy   . Nocturia   . Osteopenia   . Osteoporosis    hx of and take Reclast  . Peripheral neuropathy   . Personal history of chemotherapy   . Personal history of radiation therapy   . Right knee DJD    . Shortness of breath dyspnea    with exertion  . Thyroid disease   . Umbilical hernia     Past Surgical History:  Procedure Laterality Date  . ADENOIDECTOMY     as a child  . BREAST LUMPECTOMY Right 1994  . BREAST SURGERY Right   . BUNIONECTOMY Right   . CARDIAC CATHETERIZATION  2014  . COLONOSCOPY    . FOOT SURGERY Right   . FRACTURE SURGERY     wrist right , right knee  . INSERTION OF MESH N/A 04/17/2015   Procedure: INSERTION OF MESH;  Surgeon: Ralene Ok, MD;  Location: [place];  Service: General;  Laterality: N/A;  . KNEE SURGERY    . LEFT HEART CATHETERIZATION WITH CORONARY ANGIOGRAM N/A 12/05/2012   Procedure: LEFT HEART CATHETERIZATION WITH CORONARY ANGIOGRAM;  Surgeon: [name] Sine, MD;  Location: [facility] CATH LAB;  Service: Cardiovascular;  Laterality: N/A;  . right  breast surgery     d/t breast cancer  . TONSILLECTOMY     as a child  . TOTAL KNEE ARTHROPLASTY Right 06/19/2013   Procedure: TOTAL KNEE ARTHROPLASTY;  Surgeon: Lorn Junes, MD;  Location: [place];  Service: Orthopedics;  Laterality: Right;  . TRANSURETHRAL RESECTION OF BLADDER TUMOR WITH GYRUS (TURBT-GYRUS)     01-26-18 Dr. Louis Meckel  . TRANSURETHRAL RESECTION OF BLADDER TUMOR WITH MITOMYCIN-C Bilateral 01/26/2018   Procedure: TRANSURETHRAL RESECTION OF BLADDER TUMOR WITH GEMCITABIN BLLATERAL RETROGRADE PYELOGRAM;  Surgeon: Ardis Hughs, MD;  Location[facility];  Service: Urology;  Laterality: Bilateral;  . TURBINATE REDUCTION    . UMBILICAL HERNIA REPAIR N/A 04/17/2015   Procedure: LAPAROSCOPIC UMBILICAL HERNIA REPAIR WITH MESH;  Surgeon: Ralene Ok, MD;  Location: [place];  Service: General;  Laterality: N/A;  . UPPER GASTROINTESTINAL ENDOSCOPY      There were no vitals filed for this visit.   Subjective Assessment - 02/02/20 1159    Subjective Pt reports Dr. Posey Pronto referred her for her neck. She does have an appt with Dr. Trenton Gammon at the end of October.    Pertinent History osteoperosis, HTN,  brest cancer, depressio, lymphoma, osteopenia,    How long can you walk comfortably? Has to walk with the device. Can walk around the store if needed    Diagnostic tests 1. No acute abnormality of the thoracic spine.2. T9 vertebra plana with 6 mm retropulsion causing mild spinal canal stenosis.Lumbar MRI: L2-L5 multi level degenration    Patient Stated Goals to have less and falls and less pain in her neck    Currently in Pain? Yes   can get up to 10/10 with movement   Pain Score 0-No pain    Pain Location Neck    Pain Orientation Right    Pain Onset More than a month ago                             [facility] Adult PT Treatment/Exercise - 02/02/20 0001      Neuro Re-ed    Neuro Re-ed Details  FTEO away from table with strong R and posterior lean requiring mod A      Knee/Hip Exercises: Seated   Long Arc Quad Strengthening;Both;2 sets;10 reps    Long Arc Quad Weight 2 lbs.    Ball Squeeze 10x2; 3sec    Clamshell with TheraBand Red   10x2   Marching Strengthening;Right;Left;2 sets;10 reps    Marching Weights 2 lbs.    Sit to Sand 1 set;5 reps;with UE support   TCs and VCs for posture                   PT Short Term Goals - 01/30/20 0844      PT SHORT TERM GOAL #1   Title Therapy will review FOTO goals    Baseline reviewed with patient    Time 3    Period Weeks    Status Achieved      PT SHORT TERM GOAL #2   Title Therapy will perfrom BERG balance scale and set goal accordingly    Baseline perfromed BERG    Time 3    Period Weeks    Status Achieved    Target Date 01/30/20      PT SHORT TERM GOAL #3   Title Patient will requie CGA for narrow base balance    Time 3    Period Weeks    Status On-going    Target Date 01/30/20      PT SHORT TERM GOAL #4   Title Patient will reduce 5x sit to stand by 6 seconds    Baseline worked  on sit to stand today. Patient did well. Not formally tested    Time 3    Period Weeks    Status On-going    Target  Date 01/30/20             PT Long Term Goals - 01/09/20 1207      PT LONG TERM GOAL #1   Title Patient will demonstrate a 35% limitation on FOTO    Time 3    Period Weeks    Status New    Target Date 01/30/20      PT LONG TERM GOAL #2   Title Patient will demonstrate a <10 second sit to stand test to demonstrate decreased falls risk    Time 6    Period Weeks    Status New    Target Date 02/20/20      PT LONG TERM GOAL #3   Title Patient will demonstrate a relaistic improvement in BERG testing to show a reduced falls risk ( level to be determined next visit)    Time 6    Period Weeks    Status New    Target Date 02/20/20                 Plan - 02/02/20 1159    Clinical Impression Statement Pt is being evaluated by neurosurgeon at end of October. Will wait on treating neck until cleared and focus on LE strength/balance. Pt demo R hip drop abruptly with L stance phase and inability to weight shift L unless forced to with standing balance. Mod A required to prevent posterior fall and R LOB due to inability to CKC abduct L hip for weight acceptance to LLE.    Personal Factors and Comorbidities Comorbidity 1;Comorbidity 2;Comorbidity 3+    Comorbidities osteoperosis, anxiety, thoracic compression fracture    Examination-Activity Limitations Bend;Caring for Others;Carry;Dressing;Lift;Stand;Stairs;Squat;Reach Overhead;Locomotion Level    Examination-Participation Restrictions Cleaning;Meal Prep;Community Activity;Laundry;Shop    Stability/Clinical Decision Making Evolving/Moderate complexity    Rehab Potential Good    PT Frequency 2x / week    PT Duration 6 weeks    PT Treatment/Interventions ADLs/Self Care Home Management;Cryotherapy;Moist Heat;DME Instruction;Gait training;Stair training;Functional mobility training;Therapeutic activities;Therapeutic exercise;Neuromuscular re-education;Patient/family education;Manual techniques;Passive range of motion;Dry needling;Taping      PT Next Visit Plan continue to work on balance and strengthening exercises. New script recieved for cervical spine. Proceed with eval of the neck but be aware of thoracic compression fx. Neurosurgeon scheduled end of Oct    PT Home Exercise Plan Access Code [email or web address]: supine SLR, PPT + march, standing heel raises, standing toe raises at counter with support. Sitting exs for LAQ, marching, hip add sets, and hip abd c Tband.    Consulted and Agree with Plan of Care Patient           Patient will benefit from skilled therapeutic intervention in order to improve the following deficits and impairments:  Abnormal gait, Decreased range of motion, Increased fascial restricitons, Pain, Postural dysfunction, Decreased activity tolerance, Improper body mechanics, Impaired perceived functional ability, Difficulty walking, Decreased endurance, Decreased strength, Decreased safety awareness  Visit Diagnosis: Frequent falls  Abnormal posture  Cervicalgia     Problem List Patient Active Problem List   Diagnosis Date Noted  . Recurrent falls 11/16/2019  . Hypotension 11/15/2018  . Left shoulder pain 02/08/2018  . History of bladder cancer 02/08/2018  . Anemia, chronic disease 07/27/2017  . Weight loss 11/19/2015  . Chronic constipation 11/19/2015  . Other specified hypothyroidism 11/14/2015  .  DJD (degenerative joint disease) of knee 06/19/2013  . Right knee DJD   . Hyperlipidemia   . Thyroid disease   . Chest pain 12/26/2012  . Unstable angina ([place]) 12/03/2012  . Osteoporosis 12/03/2012  . Palpitations 12/03/2012  . Dyslipidemia 12/03/2012  . History of breast cancer in female 07/06/2011  . History of non-Hodgkin's lymphoma 07/06/2011  . Hypothyroid 07/06/2011  . Chronic ITP (idiopathic thrombocytopenia) (H[facility]) 07/06/2011    Izell [place], PT, DPT 02/02/2020, 12:41 PM  [facility] [address] [place], [place], [address] Phone:  [phone]   Fax:  [phone]  Name: Darlene [name] MRN: [id number] Date of Birth: Dec 09, 1934

## 2020-02-05 ENCOUNTER — Encounter: Payer: Self-pay | Admitting: Physical Therapy

## 2020-02-05 ENCOUNTER — Ambulatory Visit: Payer: Medicare Other | Admitting: Physical Therapy

## 2020-02-05 ENCOUNTER — Other Ambulatory Visit: Payer: Self-pay

## 2020-02-05 DIAGNOSIS — R293 Abnormal posture: Secondary | ICD-10-CM

## 2020-02-05 DIAGNOSIS — R296 Repeated falls: Secondary | ICD-10-CM | POA: Diagnosis not present

## 2020-02-05 DIAGNOSIS — M542 Cervicalgia: Secondary | ICD-10-CM

## 2020-02-05 NOTE — Therapy (Signed)
[place], [place], [address] Phone: [phone]   Fax:  [phone]  Physical Therapy Treatment  Patient Details  Name: Darlene [name] MRN: [id number] Date of Birth: 09-Feb-1935 Referring Provider (PT): Dr Narda Amber    Encounter Date: 02/05/2020   PT End of Session - 02/05/20 1431    Visit Number 6    Number of Visits 16    Date for PT Re-Evaluation 03/06/20    Authorization Type Medicare    PT Start Time 1100    PT Stop Time 1143    PT Time Calculation (min) 43 min    Equipment Utilized During Treatment Other (comment);Gait belt    Activity Tolerance Patient tolerated treatment well           Past Medical History:  Diagnosis Date   Anxiety    Breast cancer, stage 1, estrogen receptor negative ([place]) 07/06/2011   Cancer of breast ([place])    Cataract    Chronic ITP (idiopathic thrombocytopenia) ([place]) 07/06/2011   Constipation    takes Colace daily   COPD (chronic obstructive pulmonary disease) ([place])    mild   Depression    "nervous break down age 83"   Fast heart beat    GERD (gastroesophageal reflux disease)    takes Nexium daily   History of colon polyps    History of depression    received shock treatments    History of palpitations    takes Metoprolol daily   History of vertigo    HOH (hard of hearing)    has hearing aids   Hyperlipidemia    takes Lovastatin daily   Hypertension    Hypothyroid 07/06/2011   takes SYnthroid daily   Joint pain    Joint swelling    Lymphoma ([place])    Lymphoma ([place])    B cell    Lymphoma of lymph nodes of head, face, and/or neck ([place]) 07/06/2011   Neuropathy    Nocturia    Osteopenia    Osteoporosis    hx of and take Reclast   Peripheral neuropathy    Personal history of chemotherapy    Personal history of radiation therapy    Right knee DJD    Shortness of breath dyspnea    with exertion   Thyroid disease     Umbilical hernia     Past Surgical History:  Procedure Laterality Date   ADENOIDECTOMY     as a child   BREAST LUMPECTOMY Right 1994   BREAST SURGERY Right    BUNIONECTOMY Right    CARDIAC CATHETERIZATION  2014   COLONOSCOPY     FOOT SURGERY Right    FRACTURE SURGERY     wrist right , right knee   INSERTION OF MESH N/A 04/17/2015   Procedure: INSERTION OF MESH;  Surgeon: Ralene Ok, MD;  Location: [place];  Service: General;  Laterality: N/A;   KNEE SURGERY     LEFT HEART CATHETERIZATION WITH CORONARY ANGIOGRAM N/A 12/05/2012   Procedure: LEFT HEART CATHETERIZATION WITH CORONARY ANGIOGRAM;  Surgeon: [name] Sine, MD;  Location: [facility] CATH LAB;  Service: Cardiovascular;  Laterality: N/A;   right  breast surgery     d/t breast cancer   TONSILLECTOMY     as a child   TOTAL KNEE ARTHROPLASTY Right 06/19/2013   Procedure: TOTAL KNEE ARTHROPLASTY;  Surgeon: Lorn Junes, MD;  Location: [place];  Service: Orthopedics;  Laterality: Right;   TRANSURETHRAL RESECTION  OF BLADDER TUMOR WITH GYRUS (TURBT-GYRUS)     01-26-18 Dr. Louis Meckel   TRANSURETHRAL RESECTION OF BLADDER TUMOR WITH MITOMYCIN-C Bilateral 01/26/2018   Procedure: TRANSURETHRAL RESECTION OF BLADDER TUMOR WITH GEMCITABIN BLLATERAL RETROGRADE PYELOGRAM;  Surgeon: Ardis Hughs, MD;  [facility];  Service: Urology;  Laterality: Bilateral;   TURBINATE REDUCTION     UMBILICAL HERNIA REPAIR N/A 04/17/2015   Procedure: LAPAROSCOPIC UMBILICAL HERNIA REPAIR WITH MESH;  Surgeon: Ralene Ok, MD;  Location: [place];  Service: General;  Laterality: N/A;   UPPER GASTROINTESTINAL ENDOSCOPY      There were no vitals filed for this visit.   Subjective Assessment - 02/05/20 1103    Subjective Patient continues to have neck pain. She reports her appointment has been changed for her neck and back.    Pertinent History osteoperosis, HTN, brest cancer, depressio, lymphoma, osteopenia,    Limitations Standing;Walking     How long can you walk comfortably? Has to walk with the device. Can walk around the store if needed    Diagnostic tests 1. No acute abnormality of the thoracic spine.2. T9 vertebra plana with 6 mm retropulsion causing mild spinal canal stenosis.Lumbar MRI: L2-L5 multi level degenration    Patient Stated Goals to have less and falls and less pain in her neck    Currently in Pain? Yes    Pain Score 4     Pain Location Neck    Pain Orientation Right    Pain Descriptors / Indicators Aching    Pain Type Chronic pain    Pain Onset More than a month ago    Pain Frequency Constant    Aggravating Factors  turning the head    Pain Relieving Factors rest                             [facility] Adult PT Treatment/Exercise - 02/05/20 0001      High Level Balance   High Level Balance Comments Tandem stance eyes opan and closed 2x30 sec hold; feet together eyes closed and eyes opened 3x20 sec hold       Knee/Hip Exercises: Seated   Long Arc Quad Strengthening;Both;2 sets;10 reps    Long Arc Quad Weight 2 lbs.    Ball Squeeze 10x2; 3sec    Clamshell with TheraBand Red   10x2   Marching Strengthening;Right;Left;2 sets;10 reps    Marching Weights 2 lbs.    Sit to Sand 1 set;5 reps;with UE support   TCs and VCs for posture     Manual Therapy   Soft tissue mobilization Performed to upper trap and peri-scapular area using IASTYM                   PT Education - 02/05/20 1429    Education Details improtance of baalcne activity    Person(s) Educated Patient    Methods Explanation;Demonstration;Verbal cues;Tactile cues    Comprehension Verbalized understanding;Verbal cues required;Tactile cues required;Returned demonstration            PT Short Term Goals - 01/30/20 0844      PT SHORT TERM GOAL #1   Title Therapy will review FOTO goals    Baseline reviewed with patient    Time 3    Period Weeks    Status Achieved      PT SHORT TERM GOAL #2   Title Therapy will  perfrom BERG balance scale and set goal accordingly  Baseline perfromed BERG    Time 3    Period Weeks    Status Achieved    Target Date 01/30/20      PT SHORT TERM GOAL #3   Title Patient will requie CGA for narrow base balance    Time 3    Period Weeks    Status On-going    Target Date 01/30/20      PT SHORT TERM GOAL #4   Title Patient will reduce 5x sit to stand by 6 seconds    Baseline worked on sit to stand today. Patient did well. Not formally tested    Time 3    Period Weeks    Status On-going    Target Date 01/30/20             PT Long Term Goals - 01/09/20 1207      PT LONG TERM GOAL #1   Title Patient will demonstrate a 35% limitation on FOTO    Time 3    Period Weeks    Status New    Target Date 01/30/20      PT LONG TERM GOAL #2   Title Patient will demonstrate a <10 second sit to stand test to demonstrate decreased falls risk    Time 6    Period Weeks    Status New    Target Date 02/20/20      PT LONG TERM GOAL #3   Title Patient will demonstrate a relaistic improvement in BERG testing to show a reduced falls risk ( level to be determined next visit)    Time 6    Period Weeks    Status New    Target Date 02/20/20                 Plan - 02/05/20 1432    Clinical Impression Statement Patient tolerated treatment well. She demonstrated improved balance with eyes opened and eyes closed. She required only minimal assistance with eyes closed. She was able to come to a full tandem position. Therapy adjusted her walker so at least 1 of her brakes work. She was showed how to use the break. The second brakewas missing a bolt. She was advised she still would benefit most from a new walker that she can set the brakes on with transfers.    Comorbidities osteoperosis, anxiety, thoracic compression fracture    Examination-Activity Limitations Bend;Caring for Others;Carry;Dressing;Lift;Stand;Stairs;Squat;Reach Overhead;Locomotion Level     Examination-Participation Restrictions Cleaning;Meal Prep;Community Activity;Laundry;Shop    Stability/Clinical Decision Making Evolving/Moderate complexity    Clinical Decision Making Moderate    Rehab Potential Good    PT Frequency 2x / week    PT Duration 6 weeks    PT Treatment/Interventions ADLs/Self Care Home Management;Cryotherapy;Moist Heat;DME Instruction;Gait training;Stair training;Functional mobility training;Therapeutic activities;Therapeutic exercise;Neuromuscular re-education;Patient/family education;Manual techniques;Passive range of motion;Dry needling;Taping    PT Next Visit Plan continue to work on balance and strengthening exercises. New script recieved for cervical spine. Proceed with eval of the neck but be aware of thoracic compression fx. Neurosurgeon scheduled end of Oct    PT Home Exercise Plan Access Code [email or web address]: supine SLR, PPT + march, standing heel raises, standing toe raises at counter with support. Sitting exs for LAQ, marching, hip add sets, and hip abd c Tband.    Consulted and Agree with Plan of Care Patient           Patient will benefit from skilled therapeutic intervention in order to improve the following deficits and impairments:  Abnormal gait, Decreased range of motion, Increased fascial restricitons, Pain, Postural dysfunction, Decreased activity tolerance, Improper body mechanics, Impaired perceived functional ability, Difficulty walking, Decreased endurance, Decreased strength, Decreased safety awareness  Visit Diagnosis: Frequent falls  Abnormal posture  Cervicalgia     Problem List Patient Active Problem List   Diagnosis Date Noted   Recurrent falls 11/16/2019   Hypotension 11/15/2018   Left shoulder pain 02/08/2018   History of bladder cancer 02/08/2018   Anemia, chronic disease 07/27/2017   Weight loss 11/19/2015   Chronic constipation 11/19/2015   Other specified hypothyroidism 11/14/2015   DJD (degenerative joint  disease) of knee 06/19/2013   Right knee DJD    Hyperlipidemia    Thyroid disease    Chest pain 12/26/2012   Unstable angina ([place]) 12/03/2012   Osteoporosis 12/03/2012   Palpitations 12/03/2012   Dyslipidemia 12/03/2012   History of breast cancer in female 07/06/2011   History of non-Hodgkin's lymphoma 07/06/2011   Hypothyroid 07/06/2011   Chronic ITP (idiopathic thrombocytopenia) ([place]) 07/06/2011    Carney Living PT DPT  02/05/2020, 2:45 PM  [place] [facility] [address] [place], [place], [address] Phone: [phone]   Fax:  [phone]  Name: Darlene [name] MRN: [id number] Date of Birth: 1934/11/06

## 2020-02-09 ENCOUNTER — Ambulatory Visit: Payer: Medicare Other | Admitting: Physical Therapy

## 2020-02-09 ENCOUNTER — Other Ambulatory Visit: Payer: Self-pay

## 2020-02-09 DIAGNOSIS — R293 Abnormal posture: Secondary | ICD-10-CM | POA: Diagnosis not present

## 2020-02-09 DIAGNOSIS — M542 Cervicalgia: Secondary | ICD-10-CM | POA: Diagnosis not present

## 2020-02-09 DIAGNOSIS — R296 Repeated falls: Secondary | ICD-10-CM

## 2020-02-09 NOTE — Therapy (Signed)
[name], [place], [address] Phone: [phone]   Fax:  [phone]  Physical Therapy Treatment  Patient Details  Name: Darlene [name] MRN: [id number] Date of Birth: 06/26/1934 Referring Provider (PT): Dr Narda Amber    Encounter Date: 02/09/2020   PT End of Session - 02/09/20 1312    Visit Number 7    Number of Visits 16    Date for PT Re-Evaluation 03/06/20    Authorization Type Medicare    PT Start Time 1100    PT Stop Time 1141    PT Time Calculation (min) 41 min    Activity Tolerance Patient tolerated treatment well    Behavior During Therapy [facility] for tasks assessed/performed           Past Medical History:  Diagnosis Date  . Anxiety   . Breast cancer, stage 1, estrogen receptor negative ([place]) 07/06/2011  . Cancer of breast ([place])   . Cataract   . Chronic ITP (idiopathic thrombocytopenia) (HCC) 07/06/2011  . Constipation    takes Colace daily  . COPD (chronic obstructive pulmonary disease) (HCC)    mild  . Depression    "nervous break down age 69"  . Fast heart beat   . GERD (gastroesophageal reflux disease)    takes Nexium daily  . History of colon polyps   . History of depression    received shock treatments   . History of palpitations    takes Metoprolol daily  . History of vertigo   . HOH (hard of hearing)    has hearing aids  . Hyperlipidemia    takes Lovastatin daily  . Hypertension   . Hypothyroid 07/06/2011   takes SYnthroid daily  . Joint pain   . Joint swelling   . Lymphoma ([place])   . Lymphoma ([place])    B cell   . Lymphoma of lymph nodes of head, face, and/or neck ([place]) 07/06/2011  . Neuropathy   . Nocturia   . Osteopenia   . Osteoporosis    hx of and take Reclast  . Peripheral neuropathy   . Personal history of chemotherapy   . Personal history of radiation therapy   . Right knee DJD   . Shortness of breath dyspnea    with exertion  . Thyroid disease   .  Umbilical hernia     Past Surgical History:  Procedure Laterality Date  . ADENOIDECTOMY     as a child  . BREAST LUMPECTOMY Right 1994  . BREAST SURGERY Right   . BUNIONECTOMY Right   . CARDIAC CATHETERIZATION  2014  . COLONOSCOPY    . FOOT SURGERY Right   . FRACTURE SURGERY     wrist right , right knee  . INSERTION OF MESH N/A 04/17/2015   Procedure: INSERTION OF MESH;  Surgeon: Ralene Ok, MD;  Location: [place];  Service: General;  Laterality: N/A;  . KNEE SURGERY    . LEFT HEART CATHETERIZATION WITH CORONARY ANGIOGRAM N/A 12/05/2012   Procedure: LEFT HEART CATHETERIZATION WITH CORONARY ANGIOGRAM;  Surgeon: [name] Sine, MD;  Location: [facility] CATH LAB;  Service: Cardiovascular;  Laterality: N/A;  . right  breast surgery     d/t breast cancer  . TONSILLECTOMY     as a child  . TOTAL KNEE ARTHROPLASTY Right 06/19/2013   Procedure: TOTAL KNEE ARTHROPLASTY;  Surgeon: Lorn Junes, MD;  Location: [place];  Service: Orthopedics;  Laterality: Right;  . TRANSURETHRAL RESECTION  OF BLADDER TUMOR WITH GYRUS (TURBT-GYRUS)     01-26-18 Dr. Louis Meckel  . TRANSURETHRAL RESECTION OF BLADDER TUMOR WITH MITOMYCIN-C Bilateral 01/26/2018   Procedure: TRANSURETHRAL RESECTION OF BLADDER TUMOR WITH GEMCITABIN BLLATERAL RETROGRADE PYELOGRAM;  Surgeon: Ardis Hughs, MD;  [facility];  Service: Urology;  Laterality: Bilateral;  . TURBINATE REDUCTION    . UMBILICAL HERNIA REPAIR N/A 04/17/2015   Procedure: LAPAROSCOPIC UMBILICAL HERNIA REPAIR WITH MESH;  Surgeon: Ralene Ok, MD;  Location: [place];  Service: General;  Laterality: N/A;  . UPPER GASTROINTESTINAL ENDOSCOPY      There were no vitals filed for this visit.   Subjective Assessment - 02/09/20 1309    Subjective Patient reports her neck is improving. She feels like her motion is better. She had one loss of balance over the week. She backed into a wall and was able to  catch her balance but feel if the wall was not there she may have  fallen.    Pertinent History osteoperosis, HTN, brest cancer, depressio, lymphoma, osteopenia,    Limitations Standing;Walking    How long can you walk comfortably? Has to walk with the device. Can walk around the store if needed    Diagnostic tests 1. No acute abnormality of the thoracic spine.2. T9 vertebra plana with 6 mm retropulsion causing mild spinal canal stenosis.Lumbar MRI: L2-L5 multi level degenration    Patient Stated Goals to have less and falls and less pain in her neck    Currently in Pain? Yes    Pain Score 2     Pain Location Neck    Pain Orientation Right    Pain Descriptors / Indicators Aching    Pain Type Chronic pain    Pain Onset More than a month ago    Aggravating Factors  turning her head    Pain Relieving Factors rest    Effect of Pain on Daily Activities difficulty perfroming ADL's                             [facility] Adult PT Treatment/Exercise - 02/09/20 0001      High Level Balance   High Level Balance Comments Tandem stance eyes opan and closed 2x30 sec hold; feet together eyes closed and eyes opened 3x20 sec hold min gaurd for balance improved from last visit ; rock back with balance catch and PT assist 2x10       Knee/Hip Exercises: Seated   Long Arc Quad Strengthening;Both;2 sets;10 reps    Long Arc Quad Weight 2 lbs.    Ball Squeeze 10x2; 3sec    Clamshell with TheraBand Red   10x2   Marching Strengthening;Right;Left;2 sets;10 reps    Marching Weights 2 lbs.    Sit to Sand 1 set;5 reps;with UE support   TCs and VCs for posture     Manual Therapy   Soft tissue mobilization Performed to upper trap and peri-scapular area using IASTYM                   PT Education - 02/09/20 1311    Education Details reviewed POC going forward.    Person(s) Educated Patient    Methods Explanation;Demonstration;Tactile cues;Verbal cues    Comprehension Returned demonstration;Verbal cues required;Verbalized understanding;Tactile cues  required            PT Short Term Goals - 01/30/20 0844      PT SHORT TERM GOAL #1   Title  Therapy will review FOTO goals    Baseline reviewed with patient    Time 3    Period Weeks    Status Achieved      PT SHORT TERM GOAL #2   Title Therapy will perfrom BERG balance scale and set goal accordingly    Baseline perfromed BERG    Time 3    Period Weeks    Status Achieved    Target Date 01/30/20      PT SHORT TERM GOAL #3   Title Patient will requie CGA for narrow base balance    Time 3    Period Weeks    Status On-going    Target Date 01/30/20      PT SHORT TERM GOAL #4   Title Patient will reduce 5x sit to stand by 6 seconds    Baseline worked on sit to stand today. Patient did well. Not formally tested    Time 3    Period Weeks    Status On-going    Target Date 01/30/20             PT Long Term Goals - 01/09/20 1207      PT LONG TERM GOAL #1   Title Patient will demonstrate a 35% limitation on FOTO    Time 3    Period Weeks    Status New    Target Date 01/30/20      PT LONG TERM GOAL #2   Title Patient will demonstrate a <10 second sit to stand test to demonstrate decreased falls risk    Time 6    Period Weeks    Status New    Target Date 02/20/20      PT LONG TERM GOAL #3   Title Patient will demonstrate a relaistic improvement in BERG testing to show a reduced falls risk ( level to be determined next visit)    Time 6    Period Weeks    Status New    Target Date 02/20/20                 Plan - 02/09/20 1312    Clinical Impression Statement Per visual inspection the patients cervical motion has improved. she continues to have some spasming in that area. Her balance appears to have improved as well. She continues to have some isssues with posterior balance. We will continue to challenger her posterior balance. She would benefit from continued skilled therapy to improve her safety.    Personal Factors and Comorbidities Comorbidity  1;Comorbidity 2;Comorbidity 3+    Comorbidities osteoperosis, anxiety, thoracic compression fracture    Examination-Activity Limitations Bend;Caring for Others;Carry;Dressing;Lift;Stand;Stairs;Squat;Reach Overhead;Locomotion Level    Examination-Participation Restrictions Cleaning;Meal Prep;Community Activity;Laundry;Shop    Stability/Clinical Decision Making Evolving/Moderate complexity    Clinical Decision Making Moderate    Rehab Potential Good    PT Frequency 2x / week    PT Duration 6 weeks    PT Treatment/Interventions ADLs/Self Care Home Management;Cryotherapy;Moist Heat;DME Instruction;Gait training;Stair training;Functional mobility training;Therapeutic activities;Therapeutic exercise;Neuromuscular re-education;Patient/family education;Manual techniques;Passive range of motion;Dry needling;Taping    PT Next Visit Plan continue to work on balance and strengthening exercises. New script recieved for cervical spine. Proceed with eval of the neck but be aware of thoracic compression fx. Neurosurgeon scheduled end of Oct    PT Home Exercise Plan Access Code [email or web address]: supine SLR, PPT + march, standing heel raises, standing toe raises at counter with support. Sitting exs for LAQ, marching, hip add sets, and hip abd c  Tband.    Consulted and Agree with Plan of Care Patient           Patient will benefit from skilled therapeutic intervention in order to improve the following deficits and impairments:  Abnormal gait, Decreased range of motion, Increased fascial restricitons, Pain, Postural dysfunction, Decreased activity tolerance, Improper body mechanics, Impaired perceived functional ability, Difficulty walking, Decreased endurance, Decreased strength, Decreased safety awareness  Visit Diagnosis: Frequent falls  Abnormal posture  Cervicalgia     Problem List Patient Active Problem List   Diagnosis Date Noted  . Recurrent falls 11/16/2019  . Hypotension 11/15/2018  . Left shoulder  pain 02/08/2018  . History of bladder cancer 02/08/2018  . Anemia, chronic disease 07/27/2017  . Weight loss 11/19/2015  . Chronic constipation 11/19/2015  . Other specified hypothyroidism 11/14/2015  . DJD (degenerative joint disease) of knee 06/19/2013  . Right knee DJD   . Hyperlipidemia   . Thyroid disease   . Chest pain 12/26/2012  . Unstable angina ([place]) 12/03/2012  . Osteoporosis 12/03/2012  . Palpitations 12/03/2012  . Dyslipidemia 12/03/2012  . History of breast cancer in female 07/06/2011  . History of non-Hodgkin's lymphoma 07/06/2011  . Hypothyroid 07/06/2011  . Chronic ITP (idiopathic thrombocytopenia) (HCC) 07/06/2011    Carney Living  PT DPT  02/09/2020, 1:15 PM  [facility] [address] [place], [place], [address] Phone: [phone]   Fax:  [phone]  Name: Darlene [name] MRN: [id number] Date of Birth: 10/29/1934

## 2020-02-13 DIAGNOSIS — C672 Malignant neoplasm of lateral wall of bladder: Secondary | ICD-10-CM | POA: Diagnosis not present

## 2020-02-13 DIAGNOSIS — N3941 Urge incontinence: Secondary | ICD-10-CM | POA: Diagnosis not present

## 2020-02-15 ENCOUNTER — Other Ambulatory Visit: Payer: Self-pay

## 2020-02-15 ENCOUNTER — Ambulatory Visit: Payer: Medicare Other | Admitting: Physical Therapy

## 2020-02-15 ENCOUNTER — Encounter: Payer: Self-pay | Admitting: Physical Therapy

## 2020-02-15 DIAGNOSIS — M542 Cervicalgia: Secondary | ICD-10-CM

## 2020-02-15 DIAGNOSIS — R293 Abnormal posture: Secondary | ICD-10-CM | POA: Diagnosis not present

## 2020-02-15 DIAGNOSIS — R296 Repeated falls: Secondary | ICD-10-CM | POA: Diagnosis not present

## 2020-02-15 NOTE — Therapy (Signed)
[place], [place], [address] Phone: [phone]   Fax:  [phone]  Physical Therapy Treatment  Patient Details  Name: Darlene [name] MRN: [id number] Date of Birth: 05-Jul-1934 Referring Provider (PT): Dr Narda Amber    Encounter Date: 02/15/2020   PT End of Session - 02/15/20 1018    Visit Number 8    Number of Visits 16    Date for PT Re-Evaluation 03/06/20    Authorization Type Medicare    PT Start Time 1017    PT Stop Time 1057    PT Time Calculation (min) 40 min    Activity Tolerance Patient tolerated treatment well    Behavior During Therapy [facility] for tasks assessed/performed           Past Medical History:  Diagnosis Date  . Anxiety   . Breast cancer, stage 1, estrogen receptor negative ([place]) 07/06/2011  . Cancer of breast ([place])   . Cataract   . Chronic ITP (idiopathic thrombocytopenia) (HCC) 07/06/2011  . Constipation    takes Colace daily  . COPD (chronic obstructive pulmonary disease) (HCC)    mild  . Depression    "nervous break down age 34"  . Fast heart beat   . GERD (gastroesophageal reflux disease)    takes Nexium daily  . History of colon polyps   . History of depression    received shock treatments   . History of palpitations    takes Metoprolol daily  . History of vertigo   . HOH (hard of hearing)    has hearing aids  . Hyperlipidemia    takes Lovastatin daily  . Hypertension   . Hypothyroid 07/06/2011   takes SYnthroid daily  . Joint pain   . Joint swelling   . Lymphoma ([place])   . Lymphoma ([place])    B cell   . Lymphoma of lymph nodes of head, face, and/or neck ([place]) 07/06/2011  . Neuropathy   . Nocturia   . Osteopenia   . Osteoporosis    hx of and take Reclast  . Peripheral neuropathy   . Personal history of chemotherapy   . Personal history of radiation therapy   . Right knee DJD   . Shortness of breath dyspnea    with exertion  . Thyroid disease   .  Umbilical hernia     Past Surgical History:  Procedure Laterality Date  . ADENOIDECTOMY     as a child  . BREAST LUMPECTOMY Right 1994  . BREAST SURGERY Right   . BUNIONECTOMY Right   . CARDIAC CATHETERIZATION  2014  . COLONOSCOPY    . FOOT SURGERY Right   . FRACTURE SURGERY     wrist right , right knee  . INSERTION OF MESH N/A 04/17/2015   Procedure: INSERTION OF MESH;  Surgeon: Ralene Ok, MD;  Location: [place];  Service: General;  Laterality: N/A;  . KNEE SURGERY    . LEFT HEART CATHETERIZATION WITH CORONARY ANGIOGRAM N/A 12/05/2012   Procedure: LEFT HEART CATHETERIZATION WITH CORONARY ANGIOGRAM;  Surgeon: [name] Sine, MD;  Location: [facility] CATH LAB;  Service: Cardiovascular;  Laterality: N/A;  . right  breast surgery     d/t breast cancer  . TONSILLECTOMY     as a child  . TOTAL KNEE ARTHROPLASTY Right 06/19/2013   Procedure: TOTAL KNEE ARTHROPLASTY;  Surgeon: Lorn Junes, MD;  Location: [place];  Service: Orthopedics;  Laterality: Right;  . TRANSURETHRAL RESECTION  OF BLADDER TUMOR WITH GYRUS (TURBT-GYRUS)     01-26-18 Dr. Louis Meckel  . TRANSURETHRAL RESECTION OF BLADDER TUMOR WITH MITOMYCIN-C Bilateral 01/26/2018   Procedure: TRANSURETHRAL RESECTION OF BLADDER TUMOR WITH GEMCITABIN BLLATERAL RETROGRADE PYELOGRAM;  Surgeon: Ardis Hughs, MD;  [facility];  Service: Urology;  Laterality: Bilateral;  . TURBINATE REDUCTION    . UMBILICAL HERNIA REPAIR N/A 04/17/2015   Procedure: LAPAROSCOPIC UMBILICAL HERNIA REPAIR WITH MESH;  Surgeon: Ralene Ok, MD;  Location: [place];  Service: General;  Laterality: N/A;  . UPPER GASTROINTESTINAL ENDOSCOPY      There were no vitals filed for this visit.   Subjective Assessment - 02/15/20 1020    Subjective "last night i pulled a big bed spread out of the washing machine so I feel like I am alittle sore from. no falls or near falls since the previous session."    Patient Stated Goals to have less and falls and less pain in her  neck    Currently in Pain? Yes    Pain Score 3     Pain Location Neck    Pain Orientation Right    Pain Descriptors / Indicators Aching    Pain Type Chronic pain              [facility] PT Assessment - 02/15/20 0001      Assessment   Medical Diagnosis Frequent falls/ Cervical pain     Referring Provider (PT) Dr Narda Amber       Observation/Other Assessments   Focus on Therapeutic Outcomes (FOTO)  57% limited                         [facility] Adult PT Treatment/Exercise - 02/15/20 0001      Knee/Hip Exercises: Seated   Long Arc Quad Strengthening;Both;2 sets;10 reps    Long Arc Quad Weight 3 lbs.    Clamshell with TheraBand Red   2 x 10   Marching Strengthening;Right;Left;2 sets;10 reps    Marching Weights 3 lbs.    Sit to [facility] 2 sets;5 reps   cues for proper form, and cues for nose over toes.      Manual Therapy   Manual therapy comments MTPR for bil upper trap, grade    Soft tissue mobilization upper trap and peri-scapular area using IASTYM                   PT Education - 02/15/20 1053    Education Details FOTO reassessment    Person(s) Educated Patient    Methods Explanation;Verbal cues    Comprehension Verbalized understanding;Verbal cues required            PT Short Term Goals - 01/30/20 0844      PT SHORT TERM GOAL #1   Title Therapy will review FOTO goals    Baseline reviewed with patient    Time 3    Period Weeks    Status Achieved      PT SHORT TERM GOAL #2   Title Therapy will perfrom BERG balance scale and set goal accordingly    Baseline perfromed BERG    Time 3    Period Weeks    Status Achieved    Target Date 01/30/20      PT SHORT TERM GOAL #3   Title Patient will requie CGA for narrow base balance    Time 3    Period Weeks    Status On-going  Target Date 01/30/20      PT SHORT TERM GOAL #4   Title Patient will reduce 5x sit to stand by 6 seconds    Baseline worked on sit to stand today. Patient did well. Not  formally tested    Time 3    Period Weeks    Status On-going    Target Date 01/30/20             PT Long Term Goals - 01/09/20 1207      PT LONG TERM GOAL #1   Title Patient will demonstrate a 35% limitation on FOTO    Time 3    Period Weeks    Status New    Target Date 01/30/20      PT LONG TERM GOAL #2   Title Patient will demonstrate a <10 second sit to stand test to demonstrate decreased falls risk    Time 6    Period Weeks    Status New    Target Date 02/20/20      PT LONG TERM GOAL #3   Title Patient will demonstrate a relaistic improvement in BERG testing to show a reduced falls risk ( level to be determined next visit)    Time 6    Period Weeks    Status New    Target Date 02/20/20                 Plan - 02/15/20 1047    Clinical Impression Statement pt arrives reporting increased soreness in the neck due to doing activities at home. continued IATSM following MTPR along bil uppe trap/ levator. Continued working on hip/ knee strengthening progressing resistance for functional progression. her FOTO dropped down to 1 point at 57% limited but reports she feels she is making progress.    PT Treatment/Interventions ADLs/Self Care Home Management;Cryotherapy;Moist Heat;DME Instruction;Gait training;Stair training;Functional mobility training;Therapeutic activities;Therapeutic exercise;Neuromuscular re-education;Patient/family education;Manual techniques;Passive range of motion;Dry needling;Taping    PT Next Visit Plan continue to work on balance and strengthening exercises. New script recieved for cervical spine. Proceed with eval of the neck but be aware of thoracic compression fx. Neurosurgeon scheduled end of Oct    PT Home Exercise Plan Access Code [email or web address]: supine SLR, PPT + march, standing heel raises, standing toe raises at counter with support. Sitting exs for LAQ, marching, hip add sets, and hip abd c Tband.           Patient will benefit from skilled  therapeutic intervention in order to improve the following deficits and impairments:  Abnormal gait, Decreased range of motion, Increased fascial restricitons, Pain, Postural dysfunction, Decreased activity tolerance, Improper body mechanics, Impaired perceived functional ability, Difficulty walking, Decreased endurance, Decreased strength, Decreased safety awareness  Visit Diagnosis: Frequent falls  Abnormal posture  Cervicalgia     Problem List Patient Active Problem List   Diagnosis Date Noted  . Recurrent falls 11/16/2019  . Hypotension 11/15/2018  . Left shoulder pain 02/08/2018  . History of bladder cancer 02/08/2018  . Anemia, chronic disease 07/27/2017  . Weight loss 11/19/2015  . Chronic constipation 11/19/2015  . Other specified hypothyroidism 11/14/2015  . DJD (degenerative joint disease) of knee 06/19/2013  . Right knee DJD   . Hyperlipidemia   . Thyroid disease   . Chest pain 12/26/2012  . Unstable angina ([place]) 12/03/2012  . Osteoporosis 12/03/2012  . Palpitations 12/03/2012  . Dyslipidemia 12/03/2012  . History of breast cancer in female 07/06/2011  . History of non-Hodgkin's lymphoma 07/06/2011  .  Hypothyroid 07/06/2011  . Chronic ITP (idiopathic thrombocytopenia) (HCC) 07/06/2011   [place] PT, DPT, LAT, ATC  02/15/20  11:02 AM      [place] [facility] [address] [place], [place], [address] Phone: [phone]   Fax:  [phone]  Name: Darlene [name] MRN: [id number] Date of Birth: Dec 13, 1934

## 2020-02-21 ENCOUNTER — Ambulatory Visit: Payer: Medicare Other | Admitting: Podiatry

## 2020-02-23 ENCOUNTER — Other Ambulatory Visit: Payer: Self-pay

## 2020-02-23 ENCOUNTER — Ambulatory Visit: Payer: Medicare Other

## 2020-02-23 DIAGNOSIS — R293 Abnormal posture: Secondary | ICD-10-CM

## 2020-02-23 DIAGNOSIS — M542 Cervicalgia: Secondary | ICD-10-CM

## 2020-02-23 DIAGNOSIS — R296 Repeated falls: Secondary | ICD-10-CM

## 2020-02-23 NOTE — Therapy (Signed)
[place], [place], [address] Phone: [phone]   Fax:  [phone]  Physical Therapy Treatment  Patient Details  Name: Darlene [name] MRN: [id number] Date of Birth: August 09, 1934 Referring Provider (PT): Dr Narda Amber    Encounter Date: 02/23/2020   PT End of Session - 02/23/20 1026    Visit Number 9    Number of Visits 16    Date for PT Re-Evaluation 03/06/20    Authorization Type Medicare    PT Start Time 1020    PT Stop Time 1100    PT Time Calculation (min) 40 min    Activity Tolerance Patient tolerated treatment well    Behavior During Therapy [facility] for tasks assessed/performed           Past Medical History:  Diagnosis Date  . Anxiety   . Breast cancer, stage 1, estrogen receptor negative ([place]) 07/06/2011  . Cancer of breast ([place])   . Cataract   . Chronic ITP (idiopathic thrombocytopenia) (H[facility]) 07/06/2011  . Constipation    takes Colace daily  . COPD (chronic obstructive pulmonary disease) (H[facility])    mild  . Depression    "nervous break down age 41"  . Fast heart beat   . GERD (gastroesophageal reflux disease)    takes Nexium daily  . History of colon polyps   . History of depression    received shock treatments   . History of palpitations    takes Metoprolol daily  . History of vertigo   . HOH (hard of hearing)    has hearing aids  . Hyperlipidemia    takes Lovastatin daily  . Hypertension   . Hypothyroid 07/06/2011   takes SYnthroid daily  . Joint pain   . Joint swelling   . Lymphoma ([place])   . Lymphoma ([place])    B cell   . Lymphoma of lymph nodes of head, face, and/or neck ([place]) 07/06/2011  . Neuropathy   . Nocturia   . Osteopenia   . Osteoporosis    hx of and take Reclast  . Peripheral neuropathy   . Personal history of chemotherapy   . Personal history of radiation therapy   . Right knee DJD   . Shortness of breath dyspnea    with exertion  . Thyroid disease   .  Umbilical hernia     Past Surgical History:  Procedure Laterality Date  . ADENOIDECTOMY     as a child  . BREAST LUMPECTOMY Right 1994  . BREAST SURGERY Right   . BUNIONECTOMY Right   . CARDIAC CATHETERIZATION  2014  . COLONOSCOPY    . FOOT SURGERY Right   . FRACTURE SURGERY     wrist right , right knee  . INSERTION OF MESH N/A 04/17/2015   Procedure: INSERTION OF MESH;  Surgeon: Ralene Ok, MD;  Location: [place];  Service: General;  Laterality: N/A;  . KNEE SURGERY    . LEFT HEART CATHETERIZATION WITH CORONARY ANGIOGRAM N/A 12/05/2012   Procedure: LEFT HEART CATHETERIZATION WITH CORONARY ANGIOGRAM;  Surgeon: [name] Sine, MD;  Location: [facility] CATH LAB;  Service: Cardiovascular;  Laterality: N/A;  . right  breast surgery     d/t breast cancer  . TONSILLECTOMY     as a child  . TOTAL KNEE ARTHROPLASTY Right 06/19/2013   Procedure: TOTAL KNEE ARTHROPLASTY;  Surgeon: Lorn Junes, MD;  Location: [place];  Service: Orthopedics;  Laterality: Right;  . TRANSURETHRAL RESECTION  OF BLADDER TUMOR WITH GYRUS (TURBT-GYRUS)     01-26-18 Dr. Louis Meckel  . TRANSURETHRAL RESECTION OF BLADDER TUMOR WITH MITOMYCIN-C Bilateral 01/26/2018   Procedure: TRANSURETHRAL RESECTION OF BLADDER TUMOR WITH GEMCITABIN BLLATERAL RETROGRADE PYELOGRAM;  Surgeon: Ardis Hughs, MD;  Location: [facility];  Service: Urology;  Laterality: Bilateral;  . TURBINATE REDUCTION    . UMBILICAL HERNIA REPAIR N/A 04/17/2015   Procedure: LAPAROSCOPIC UMBILICAL HERNIA REPAIR WITH MESH;  Surgeon: Ralene Ok, MD;  Location: [place];  Service: General;  Laterality: N/A;  . UPPER GASTROINTESTINAL ENDOSCOPY      There were no vitals filed for this visit.   Subjective Assessment - 02/23/20 1027    Subjective Pt reports her neck is bothering her today.    Patient Stated Goals to have less and falls and less pain in her neck    Currently in Pain? Yes    Pain Score 3     Pain Location Neck    Pain Orientation Right    Pain  Descriptors / Indicators Aching    Pain Type Chronic pain                             [facility] Adult PT Treatment/Exercise - 02/23/20 0001      Knee/Hip Exercises: Seated   Long Arc Quad Strengthening;Both;2 sets;10 reps    Long Arc Quad Weight 3 lbs.    Clamshell with TheraBand Green   2 x 10   Marching Strengthening;Right;Left;2 sets;10 reps    Marching Weights 3 lbs.    Sit to [facility] 2 sets;5 reps   cues for proper form, and cues for nose over toes.      Knee/Hip Exercises: Sidelying   Clams L clams 2 x 20                    PT Short Term Goals - 01/30/20 0844      PT SHORT TERM GOAL #1   Title Therapy will review FOTO goals    Baseline reviewed with patient    Time 3    Period Weeks    Status Achieved      PT SHORT TERM GOAL #2   Title Therapy will perfrom BERG balance scale and set goal accordingly    Baseline perfromed BERG    Time 3    Period Weeks    Status Achieved    Target Date 01/30/20      PT SHORT TERM GOAL #3   Title Patient will requie CGA for narrow base balance    Time 3    Period Weeks    Status On-going    Target Date 01/30/20      PT SHORT TERM GOAL #4   Title Patient will reduce 5x sit to stand by 6 seconds    Baseline worked on sit to stand today. Patient did well. Not formally tested    Time 3    Period Weeks    Status On-going    Target Date 01/30/20             PT Long Term Goals - 01/09/20 1207      PT LONG TERM GOAL #1   Title Patient will demonstrate a 35% limitation on FOTO    Time 3    Period Weeks    Status New    Target Date 01/30/20      PT LONG TERM GOAL #2   Title  Patient will demonstrate a <10 second sit to stand test to demonstrate decreased falls risk    Time 6    Period Weeks    Status New    Target Date 02/20/20      PT LONG TERM GOAL #3   Title Patient will demonstrate a relaistic improvement in BERG testing to show a reduced falls risk ( level to be determined next visit)     Time 6    Period Weeks    Status New    Target Date 02/20/20                 Plan - 02/23/20 1026    Clinical Impression Statement Pt continues to have minor neck pain, as expected with poor cervical posturing. Focused more on L glute strength today with S/L clams due to poor ability to accept weight on LLE when ambulating. Pt has poor carryover during sit <> stands and difficulty with power production to stand and eccentric control to sit.    PT Treatment/Interventions ADLs/Self Care Home Management;Cryotherapy;Moist Heat;DME Instruction;Gait training;Stair training;Functional mobility training;Therapeutic activities;Therapeutic exercise;Neuromuscular re-education;Patient/family education;Manual techniques;Passive range of motion;Dry needling;Taping    PT Next Visit Plan continue to work on balance and strengthening exercises. New script recieved for cervical spine. Proceed with eval of the neck but be aware of thoracic compression fx. Neurosurgeon scheduled end of Oct    PT Home Exercise Plan Access Code [email or web address]: supine SLR, PPT + march, standing heel raises, standing toe raises at counter with support. Sitting exs for LAQ, marching, hip add sets, and hip abd c Tband.    Consulted and Agree with Plan of Care Patient           Patient will benefit from skilled therapeutic intervention in order to improve the following deficits and impairments:  Abnormal gait, Decreased range of motion, Increased fascial restricitons, Pain, Postural dysfunction, Decreased activity tolerance, Improper body mechanics, Impaired perceived functional ability, Difficulty walking, Decreased endurance, Decreased strength, Decreased safety awareness  Visit Diagnosis: Frequent falls  Abnormal posture  Cervicalgia     Problem List Patient Active Problem List   Diagnosis Date Noted  . Recurrent falls 11/16/2019  . Hypotension 11/15/2018  . Left shoulder pain 02/08/2018  . History of bladder cancer  02/08/2018  . Anemia, chronic disease 07/27/2017  . Weight loss 11/19/2015  . Chronic constipation 11/19/2015  . Other specified hypothyroidism 11/14/2015  . DJD (degenerative joint disease) of knee 06/19/2013  . Right knee DJD   . Hyperlipidemia   . Thyroid disease   . Chest pain 12/26/2012  . Unstable angina ([place]) 12/03/2012  . Osteoporosis 12/03/2012  . Palpitations 12/03/2012  . Dyslipidemia 12/03/2012  . History of breast cancer in female 07/06/2011  . History of non-Hodgkin's lymphoma 07/06/2011  . Hypothyroid 07/06/2011  . Chronic ITP (idiopathic thrombocytopenia) (HCC) 07/06/2011    Izell Niantic, PT, DPT 02/23/2020, 11:01 AM  [facility] [address] [place], [place], [address] Phone: [phone]   Fax:  [phone]  Name: MIKIYA NEBERGALL MRN: [id number] Date of Birth: [birth year]

## 2020-02-26 ENCOUNTER — Ambulatory Visit: Payer: Medicare Other | Admitting: Neurology

## 2020-02-27 ENCOUNTER — Ambulatory Visit: Payer: Medicare Other

## 2020-02-27 ENCOUNTER — Other Ambulatory Visit: Payer: Self-pay

## 2020-02-27 DIAGNOSIS — R296 Repeated falls: Secondary | ICD-10-CM | POA: Diagnosis not present

## 2020-02-27 DIAGNOSIS — M542 Cervicalgia: Secondary | ICD-10-CM

## 2020-02-27 DIAGNOSIS — R293 Abnormal posture: Secondary | ICD-10-CM | POA: Diagnosis not present

## 2020-02-27 NOTE — Therapy (Signed)
Darlene [name], [place], [address] Phone: [phone]   Fax:  [phone]  Physical Therapy Treatment/Progress Note  Progress Note Reporting Period 01/09/20 to 02/27/20  See note below for Objective Data and Assessment of Progress/Goals.       Patient Details  Name: Darlene [name] MRN: [id number] Date of Birth: 14-Oct-1934 Referring Provider (PT): Dr Darlene [name]    Encounter Date: 02/27/2020   PT End of Session - 02/27/20 1112    Visit Number 10    Number of Visits 16    Date for PT Re-Evaluation 03/06/20    Authorization Type Medicare    PT Start Time 1104    PT Stop Time 1145    PT Time Calculation (min) 41 min    Activity Tolerance Patient tolerated treatment well    Behavior During Therapy [facility] for tasks assessed/performed           Past Medical History:  Diagnosis Date  . Anxiety   . Breast cancer, stage 1, estrogen receptor negative (Darlene [name]) 07/06/2011  . Cancer of breast (Darlene [name])   . Cataract   . Chronic ITP (idiopathic thrombocytopenia) (Darlene [name]) 07/06/2011  . Constipation    takes Colace daily  . COPD (chronic obstructive pulmonary disease) (Darlene [name])    mild  . Depression    "nervous break down age 14"  . Fast heart beat   . GERD (gastroesophageal reflux disease)    takes Nexium daily  . History of colon polyps   . History of depression    received shock treatments   . History of palpitations    takes Metoprolol daily  . History of vertigo   . HOH (hard of hearing)    has hearing aids  . Hyperlipidemia    takes Lovastatin daily  . Hypertension   . Hypothyroid 07/06/2011   takes SYnthroid daily  . Joint pain   . Joint swelling   . Lymphoma (Darlene [name])   . Lymphoma (Darlene [name])    B cell   . Lymphoma of lymph nodes of head, face, and/or neck (Darlene [name]) 07/06/2011  . Neuropathy   . Nocturia   . Osteopenia   . Osteoporosis    hx of and take Reclast  . Peripheral neuropathy   . Personal history of  chemotherapy   . Personal history of radiation therapy   . Right knee DJD   . Shortness of breath dyspnea    with exertion  . Thyroid disease   . Umbilical hernia     Past Surgical History:  Procedure Laterality Date  . ADENOIDECTOMY     as a child  . BREAST LUMPECTOMY Right 1994  . BREAST SURGERY Right   . BUNIONECTOMY Right   . CARDIAC CATHETERIZATION  2014  . COLONOSCOPY    . FOOT SURGERY Right   . FRACTURE SURGERY     wrist right , right knee  . INSERTION OF MESH N/A 04/17/2015   Procedure: INSERTION OF MESH;  Surgeon: Ralene Ok, MD;  Location: [place];  Service: General;  Laterality: N/A;  . KNEE SURGERY    . LEFT HEART CATHETERIZATION WITH CORONARY ANGIOGRAM N/A 12/05/2012   Procedure: LEFT HEART CATHETERIZATION WITH CORONARY ANGIOGRAM;  Surgeon: [name] Sine, MD;  Location: [facility] CATH LAB;  Service: Cardiovascular;  Laterality: N/A;  . right  breast surgery     d/t breast cancer  . TONSILLECTOMY     as a child  . TOTAL KNEE ARTHROPLASTY Right 06/19/2013  Procedure: TOTAL KNEE ARTHROPLASTY;  Surgeon: Lorn Junes, MD;  Location: [place];  Service: Orthopedics;  Laterality: Right;  . TRANSURETHRAL RESECTION OF BLADDER TUMOR WITH GYRUS (TURBT-GYRUS)     01-26-18 Dr. Louis Meckel  . TRANSURETHRAL RESECTION OF BLADDER TUMOR WITH MITOMYCIN-C Bilateral 01/26/2018   Procedure: TRANSURETHRAL RESECTION OF BLADDER TUMOR WITH GEMCITABIN BLLATERAL RETROGRADE PYELOGRAM;  Surgeon: Ardis Hughs, MD;  [facility];  Service: Urology;  Laterality: Bilateral;  . TURBINATE REDUCTION    . UMBILICAL HERNIA REPAIR N/A 04/17/2015   Procedure: LAPAROSCOPIC UMBILICAL HERNIA REPAIR WITH MESH;  Surgeon: Ralene Ok, MD;  Location: [place];  Service: General;  Laterality: N/A;  . UPPER GASTROINTESTINAL ENDOSCOPY      There were no vitals filed for this visit.   Subjective Assessment - 02/27/20 1110    Subjective Pt reports she felt muscular soreness after her last visit, especially in  her L glute. Her neck continues to bother her.    Patient Stated Goals to have less and falls and less pain in her neck    Currently in Pain? No/denies                             [facility] Adult PT Treatment/Exercise - 02/27/20 0001      Self-Care   Self-Care Posture;Other Self-Care Comments    Posture importance of neutral trunk for balance and neck/thoracic posture causing R sided neck pain    Other Self-Care Comments  FOTO and review      Neuro Re-ed    Neuro Re-ed Details  Standing weight shifting with L hip CKC abduction using rollator with PT behind pt righting trunk to correct R lateral lean simultaneously; worked on gait training with L hip CKC abd and trunk righting using rollator                    PT Short Term Goals - 02/27/20 1112      PT SHORT TERM GOAL #1   Title Therapy will review FOTO goals    Baseline reviewed with patient    Time 3    Period Weeks    Status Achieved      PT SHORT TERM GOAL #2   Title Therapy will perfrom BERG balance scale and set goal accordingly    Baseline performed BERG    Status Achieved      PT SHORT TERM GOAL #3   Title Patient will require CGA for narrow base balance    Baseline min A required at minimum to protect pt from losing balance    Time 3    Period Weeks    Status On-going    Target Date 03/19/20      PT SHORT TERM GOAL #4   Title Patient will reduce 5x sit to stand by 6 seconds    Baseline Pt requires cues for anterior scooting to edge of chair before standing with pt taking well over 30 seconds to perform 5 STS with B UE support from armrests on chair and CGA (req Min-Mod A to correct balance after 1st stand due to poor foot placement)    Time 3    Period Weeks    Status On-going    Target Date 03/19/20             PT Long Term Goals - 02/27/20 1123      PT LONG TERM GOAL #1   Title Patient will  demonstrate a 35% limitation on FOTO    Time 3    Period Weeks    Status New       PT LONG TERM GOAL #2   Title Patient will demonstrate a <10 second sit to stand test to demonstrate decreased falls risk    Time 6    Period Weeks    Status New      PT LONG TERM GOAL #3   Title Patient will demonstrate a relaistic improvement in BERG testing to show a reduced falls risk ( level to be determined next visit)    Time 6    Period Weeks    Status New                 Plan - 02/27/20 1112    Clinical Impression Statement Session focused on progress note, reviewing goals and continued work of LE power and standing balance. Worked on weight shifting c PT manually achieving L hip CKC abduction before ambulating c continued manual L hip CKC ABD and centering trunk to prevent R postural lean. Mild carryover of pt performing but quick degradation once fatigue sets in.    PT Treatment/Interventions ADLs/Self Care Home Management;Cryotherapy;Moist Heat;DME Instruction;Gait training;Stair training;Functional mobility training;Therapeutic activities;Therapeutic exercise;Neuromuscular re-education;Patient/family education;Manual techniques;Passive range of motion;Dry needling;Taping    PT Next Visit Plan continue to work on balance and strengthening exercises. New script recieved for cervical spine. Proceed with eval of the neck but be aware of thoracic compression fx. Neurosurgeon scheduled end of Oct    PT Home Exercise Plan Access Code [email or web address]: supine SLR, PPT + march, standing heel raises, standing toe raises at counter with support. Sitting exs for LAQ, marching, hip add sets, and hip abd c Tband.    Consulted and Agree with Plan of Care Patient           Patient will benefit from skilled therapeutic intervention in order to improve the following deficits and impairments:  Abnormal gait, Decreased range of motion, Increased fascial restricitons, Pain, Postural dysfunction, Decreased activity tolerance, Improper body mechanics, Impaired perceived functional ability, Difficulty  walking, Decreased endurance, Decreased strength, Decreased safety awareness  Visit Diagnosis: Frequent falls  Abnormal posture  Cervicalgia     Problem List Patient Active Problem List   Diagnosis Date Noted  . Recurrent falls 11/16/2019  . Hypotension 11/15/2018  . Left shoulder pain 02/08/2018  . History of bladder cancer 02/08/2018  . Anemia, chronic disease 07/27/2017  . Weight loss 11/19/2015  . Chronic constipation 11/19/2015  . Other specified hypothyroidism 11/14/2015  . DJD (degenerative joint disease) of knee 06/19/2013  . Right knee DJD   . Hyperlipidemia   . Thyroid disease   . Chest pain 12/26/2012  . Unstable angina ([place]) 12/03/2012  . Osteoporosis 12/03/2012  . Palpitations 12/03/2012  . Dyslipidemia 12/03/2012  . History of breast cancer in female 07/06/2011  . History of non-Hodgkin's lymphoma 07/06/2011  . Hypothyroid 07/06/2011  . Chronic ITP (idiopathic thrombocytopenia) ([place]ene [name]) 07/06/2011    Izell [place], PT, DPT 02/27/2020, 1:26 PM  [facility] [address] [place], [place], [address] Phone: [phone]   Fax:  [phone]  Name: Darlene [name] MRN: [id number] Date of Birth: 08-27-1934

## 2020-02-29 DIAGNOSIS — M48062 Spinal stenosis, lumbar region with neurogenic claudication: Secondary | ICD-10-CM | POA: Diagnosis not present

## 2020-03-01 ENCOUNTER — Other Ambulatory Visit: Payer: Self-pay

## 2020-03-01 ENCOUNTER — Ambulatory Visit (INDEPENDENT_AMBULATORY_CARE_PROVIDER_SITE_OTHER): Payer: Medicare Other | Admitting: Podiatry

## 2020-03-01 ENCOUNTER — Encounter: Payer: Self-pay | Admitting: Podiatry

## 2020-03-01 DIAGNOSIS — M79676 Pain in unspecified toe(s): Secondary | ICD-10-CM

## 2020-03-01 DIAGNOSIS — B351 Tinea unguium: Secondary | ICD-10-CM | POA: Diagnosis not present

## 2020-03-01 NOTE — Progress Notes (Signed)
This patient returns to the office for evaluation and treatment of long thick painful nails .  This patient is unable to trim her own nails since the patient cannot reach her feet.  Patient says the nails are painful walking and wearing his shoes.  She returns for preventive foot care services.  General Appearance  Alert, conversant and in no acute stress.  Vascular  Dorsalis pedis and posterior tibial  pulses are palpable  bilaterally.  Capillary return is within normal limits  bilaterally. Temperature is within normal limits  bilaterally.  Neurologic  Senn-Weinstein monofilament wire test within normal limits  bilaterally. Muscle power within normal limits bilaterally.  Nails Thick disfigured discolored nails with subungual debris  from hallux to fifth toes bilaterally. No evidence of bacterial infection or drainage bilaterally.  Orthopedic  No limitations of motion  feet .  No crepitus or effusions noted.  No bony pathology or digital deformities noted.  Hallux varus 1st MPJ and hallux malleus right foot.   Hammer toes 2,3 right.  Skin  normotropic skin with no porokeratosis noted bilaterally.  No signs of infections or ulcers noted.     Onychomycosis  Pain in toes right foot  Pain in toes left foot  Debridement  of nails  1-5  B/L with a nail nipper.  Nails were then filed using a dremel tool with no incidents.    RTC 3 months    Lamir Racca DPM  

## 2020-03-04 ENCOUNTER — Ambulatory Visit: Payer: Medicare Other | Attending: Neurology | Admitting: Physical Therapy

## 2020-03-04 ENCOUNTER — Other Ambulatory Visit: Payer: Self-pay

## 2020-03-04 DIAGNOSIS — R293 Abnormal posture: Secondary | ICD-10-CM | POA: Diagnosis not present

## 2020-03-04 DIAGNOSIS — R296 Repeated falls: Secondary | ICD-10-CM | POA: Diagnosis not present

## 2020-03-04 DIAGNOSIS — M542 Cervicalgia: Secondary | ICD-10-CM | POA: Diagnosis not present

## 2020-03-05 ENCOUNTER — Encounter: Payer: Self-pay | Admitting: Physical Therapy

## 2020-03-05 NOTE — Therapy (Signed)
[place], [place], [address] Phone: [phone]   Fax:  [phone]  Physical Therapy Treatment  Patient Details  Name: Darlene [name] MRN: [id number] Date of Birth: [birth year] Referring Provider (PT): Dr Narda Amber    Encounter Date: 03/04/2020   PT End of Session - 03/05/20 1247    Visit Number 11    Number of Visits 16    Date for PT Re-Evaluation 03/06/20    Authorization Type Medicare    PT Start Time 6256    PT Stop Time 1228    PT Time Calculation (min) 43 min    Activity Tolerance Patient tolerated treatment well    Behavior During Therapy [facility] for tasks assessed/performed           Past Medical History:  Diagnosis Date  . Anxiety   . Breast cancer, stage 1, estrogen receptor negative ([place]) 07/06/2011  . Cancer of breast ([place])   . Cataract   . Chronic ITP (idiopathic thrombocytopenia) (HCC) 07/06/2011  . Constipation    takes Colace daily  . COPD (chronic obstructive pulmonary disease) (HCC)    mild  . Depression    "nervous break down age 65"  . Fast heart beat   . GERD (gastroesophageal reflux disease)    takes Nexium daily  . History of colon polyps   . History of depression    received shock treatments   . History of palpitations    takes Metoprolol daily  . History of vertigo   . HOH (hard of hearing)    has hearing aids  . Hyperlipidemia    takes Lovastatin daily  . Hypertension   . Hypothyroid 07/06/2011   takes SYnthroid daily  . Joint pain   . Joint swelling   . Lymphoma ([place])   . Lymphoma ([place])    B cell   . Lymphoma of lymph nodes of head, face, and/or neck ([place]) 07/06/2011  . Neuropathy   . Nocturia   . Osteopenia   . Osteoporosis    hx of and take Reclast  . Peripheral neuropathy   . Personal history of chemotherapy   . Personal history of radiation therapy   . Right knee DJD   . Shortness of breath dyspnea    with exertion  . Thyroid disease   .  Umbilical hernia     Past Surgical History:  Procedure Laterality Date  . ADENOIDECTOMY     as a child  . BREAST LUMPECTOMY Right 1994  . BREAST SURGERY Right   . BUNIONECTOMY Right   . CARDIAC CATHETERIZATION  2014  . COLONOSCOPY    . FOOT SURGERY Right   . FRACTURE SURGERY     wrist right , right knee  . INSERTION OF MESH N/A 04/17/2015   Procedure: INSERTION OF MESH;  Surgeon: Ralene Ok, MD;  Location: [place];  Service: General;  Laterality: N/A;  . KNEE SURGERY    . LEFT HEART CATHETERIZATION WITH CORONARY ANGIOGRAM N/A 12/05/2012   Procedure: LEFT HEART CATHETERIZATION WITH CORONARY ANGIOGRAM;  Surgeon: [name] Sine, MD;  Location[facility] CATH LAB;  Service: Cardiovascular;  Laterality: N/A;  . right  breast surgery     d/t breast cancer  . TONSILLECTOMY     as a child  . TOTAL KNEE ARTHROPLASTY Right 06/19/2013   Procedure: TOTAL KNEE ARTHROPLASTY;  Surgeon: Lorn Junes, MD;  Location: [place];  Service: Orthopedics;  Laterality: Right;  . TRANSURETHRAL RESECTION  OF BLADDER TUMOR WITH GYRUS (TURBT-GYRUS)     01-26-18 Dr. Louis Meckel  . TRANSURETHRAL RESECTION OF BLADDER TUMOR WITH MITOMYCIN-C Bilateral 01/26/2018   Procedure: TRANSURETHRAL RESECTION OF BLADDER TUMOR WITH GEMCITABIN BLLATERAL RETROGRADE PYELOGRAM;  Surgeon: Ardis Hughs, MD;  [facility];  Service: Urology;  Laterality: Bilateral;  . TURBINATE REDUCTION    . UMBILICAL HERNIA REPAIR N/A 04/17/2015   Procedure: LAPAROSCOPIC UMBILICAL HERNIA REPAIR WITH MESH;  Surgeon: Ralene Ok, MD;  Location: [place];  Service: General;  Laterality: N/A;  . UPPER GASTROINTESTINAL ENDOSCOPY      There were no vitals filed for this visit.   Subjective Assessment - 03/05/20 1246    Subjective Patient continues to have neck pain. Last night she had increased neck pain. she is not sure the reason. She has not had a fall.    Pertinent History osteoperosis, HTN, brest cancer, depressio, lymphoma, osteopenia,     Limitations Standing;Walking    How long can you walk comfortably? Has to walk with the device. Can walk around the store if needed    Diagnostic tests 1. No acute abnormality of the thoracic spine.2. T9 vertebra plana with 6 mm retropulsion causing mild spinal canal stenosis.Lumbar MRI: L2-L5 multi level degenration    Currently in Pain? Yes    Pain Score 5     Pain Location Neck    Pain Orientation Right    Pain Descriptors / Indicators Aching    Pain Type Chronic pain    Pain Onset More than a month ago    Pain Frequency Constant    Aggravating Factors  turning her head    Pain Relieving Factors rest    Effect of Pain on Daily Activities difficulty perfroming ADL's                             [facility] Adult PT Treatment/Exercise - 03/05/20 0001      High Level Balance   High Level Balance Comments narrow base eyes opened SBA; eyes closed min a; tandem stance min a for balance all perfromed 2x30 sec hold each; rocking on sir-ex min a for psoterior balance.       Knee/Hip Exercises: Seated   Other Seated Knee/Hip Exercises bilateral shoulder er 2x10 yellow; shoulder fleixon cane 2x10; horizontal baduction 2x10 yellow       Manual Therapy   Manual Therapy Soft tissue mobilization    Manual therapy comments MTPR for bil upper trap, grade/ IASTYM to upper trap and peri-scapual area     Soft tissue mobilization upper trap and peri-scapular area using IASTYM                   PT Education - 03/05/20 1247    Education Details reviewed HEP and symptom management    Person(s) Educated Patient    Methods Explanation;Demonstration;Tactile cues;Verbal cues    Comprehension Verbalized understanding;Returned demonstration;Tactile cues required;Verbal cues required            PT Short Term Goals - 02/27/20 1112      PT SHORT TERM GOAL #1   Title Therapy will review FOTO goals    Baseline reviewed with patient    Time 3    Period Weeks    Status Achieved       PT SHORT TERM GOAL #2   Title Therapy will perfrom BERG balance scale and set goal accordingly    Baseline performed BERG  Status Achieved      PT SHORT TERM GOAL #3   Title Patient will require CGA for narrow base balance    Baseline min A required at minimum to protect pt from losing balance    Time 3    Period Weeks    Status On-going    Target Date 03/19/20      PT SHORT TERM GOAL #4   Title Patient will reduce 5x sit to stand by 6 seconds    Baseline Pt requires cues for anterior scooting to edge of chair before standing with pt taking well over 30 seconds to perform 5 STS with B UE support from armrests on chair and CGA (req Min-Mod A to correct balance after 1st stand due to poor foot placement)    Time 3    Period Weeks    Status On-going    Target Date 03/19/20             PT Long Term Goals - 02/27/20 1123      PT LONG TERM GOAL #1   Title Patient will demonstrate a 35% limitation on FOTO    Time 3    Period Weeks    Status New      PT LONG TERM GOAL #2   Title Patient will demonstrate a <10 second sit to stand test to demonstrate decreased falls risk    Time 6    Period Weeks    Status New      PT LONG TERM GOAL #3   Title Patient will demonstrate a relaistic improvement in BERG testing to show a reduced falls risk ( level to be determined next visit)    Time 6    Period Weeks    Status New                 Plan - 03/05/20 1249    Clinical Impression Statement Therapy focused on postural exercises today. She tolerated well without increased pain. She has been to the MD about her compression fx. They will treat conservativley. We also worked on balance. She did better with eyes open balance but she continues to require assist with eyes closed.    Personal Factors and Comorbidities Comorbidity 1;Comorbidity 2;Comorbidity 3+    Comorbidities osteoperosis, anxiety, thoracic compression fracture    Examination-Participation Restrictions  Cleaning;Meal Prep;Community Activity;Laundry;Shop    Stability/Clinical Decision Making Evolving/Moderate complexity    Clinical Decision Making Moderate    Rehab Potential Good    PT Frequency 2x / week    PT Duration 6 weeks    PT Treatment/Interventions ADLs/Self Care Home Management;Cryotherapy;Moist Heat;DME Instruction;Gait training;Stair training;Functional mobility training;Therapeutic activities;Therapeutic exercise;Neuromuscular re-education;Patient/family education;Manual techniques;Passive range of motion;Dry needling;Taping    PT Next Visit Plan continue to work on balance and strengthening exercises. New script recieved for cervical spine. Proceed with eval of the neck but be aware of thoracic compression fx. Neurosurgeon scheduled end of Oct    PT Home Exercise Plan Access Code [email or web address]: supine SLR, PPT + march, standing heel raises, standing toe raises at counter with support. Sitting exs for LAQ, marching, hip add sets, and hip abd c Tband.    Consulted and Agree with Plan of Care Patient           Patient will benefit from skilled therapeutic intervention in order to improve the following deficits and impairments:  Abnormal gait, Decreased range of motion, Increased fascial restricitons, Pain, Postural dysfunction, Decreased activity tolerance, Improper body mechanics, Impaired perceived functional  ability, Difficulty walking, Decreased endurance, Decreased strength, Decreased safety awareness  Visit Diagnosis: Frequent falls  Abnormal posture  Cervicalgia     Problem List Patient Active Problem List   Diagnosis Date Noted  . Recurrent falls 11/16/2019  . Hypotension 11/15/2018  . Left shoulder pain 02/08/2018  . History of bladder cancer 02/08/2018  . Anemia, chronic disease 07/27/2017  . Weight loss 11/19/2015  . Chronic constipation 11/19/2015  . Other specified hypothyroidism 11/14/2015  . DJD (degenerative joint disease) of knee 06/19/2013  . Right knee  DJD   . Hyperlipidemia   . Thyroid disease   . Chest pain 12/26/2012  . Unstable angina ([place]) 12/03/2012  . Osteoporosis 12/03/2012  . Palpitations 12/03/2012  . Dyslipidemia 12/03/2012  . History of breast cancer in female 07/06/2011  . History of non-Hodgkin's lymphoma 07/06/2011  . Hypothyroid 07/06/2011  . Chronic ITP (idiopathic thrombocytopenia) (HCC) 07/06/2011    Carney Living PT DPT  03/05/2020, 12:56 PM  [facility] [address] [place], [place], [address] Phone: [phone]   Fax:  [phone]  Name: Darlene [name] MRN: [id number] Date of Birth: [birth year]

## 2020-03-12 ENCOUNTER — Ambulatory Visit: Payer: Medicare Other | Admitting: Physical Therapy

## 2020-03-12 ENCOUNTER — Other Ambulatory Visit: Payer: Self-pay

## 2020-03-12 ENCOUNTER — Encounter: Payer: Self-pay | Admitting: Physical Therapy

## 2020-03-12 DIAGNOSIS — R296 Repeated falls: Secondary | ICD-10-CM

## 2020-03-12 DIAGNOSIS — M542 Cervicalgia: Secondary | ICD-10-CM

## 2020-03-12 DIAGNOSIS — R293 Abnormal posture: Secondary | ICD-10-CM | POA: Diagnosis not present

## 2020-03-13 NOTE — Addendum Note (Signed)
Addended by: Carney Living on: 03/13/2020 01:14 PM   Modules accepted: Orders

## 2020-03-13 NOTE — Therapy (Signed)
[place], [place], [address] Phone: [phone]   Fax:  [phone]  Physical Therapy Treatment  Patient Details  Name: Darlene [name] MRN: [id number] Date of Birth: 11/18/1934 Referring Provider (PT): Dr Narda Amber    Encounter Date: 03/12/2020   PT End of Session - 03/12/20 1525    Visit Number 12    Number of Visits 24    Date for PT Re-Evaluation 04/23/20    Authorization Type Medicare    PT Start Time 1340   Patient 6 mintes late then had to go to the bathroom   PT Stop Time 1415    PT Time Calculation (min) 35 min    Equipment Utilized During Treatment Other (comment);Gait belt    Activity Tolerance Patient tolerated treatment well    Behavior During Therapy [facility] for tasks assessed/performed           Past Medical History:  Diagnosis Date  . Anxiety   . Breast cancer, stage 1, estrogen receptor negative ([place]) 07/06/2011  . Cancer of breast ([place])   . Cataract   . Chronic ITP (idiopathic thrombocytopenia) (HCC) 07/06/2011  . Constipation    takes Colace daily  . COPD (chronic obstructive pulmonary disease) (HCC)    mild  . Depression    "nervous break down age 16"  . Fast heart beat   . GERD (gastroesophageal reflux disease)    takes Nexium daily  . History of colon polyps   . History of depression    received shock treatments   . History of palpitations    takes Metoprolol daily  . History of vertigo   . HOH (hard of hearing)    has hearing aids  . Hyperlipidemia    takes Lovastatin daily  . Hypertension   . Hypothyroid 07/06/2011   takes SYnthroid daily  . Joint pain   . Joint swelling   . Lymphoma ([place])   . Lymphoma ([place])    B cell   . Lymphoma of lymph nodes of head, face, and/or neck ([place]) 07/06/2011  . Neuropathy   . Nocturia   . Osteopenia   . Osteoporosis    hx of and take Reclast  . Peripheral neuropathy   . Personal history of chemotherapy   . Personal history of  radiation therapy   . Right knee DJD   . Shortness of breath dyspnea    with exertion  . Thyroid disease   . Umbilical hernia     Past Surgical History:  Procedure Laterality Date  . ADENOIDECTOMY     as a child  . BREAST LUMPECTOMY Right 1994  . BREAST SURGERY Right   . BUNIONECTOMY Right   . CARDIAC CATHETERIZATION  2014  . COLONOSCOPY    . FOOT SURGERY Right   . FRACTURE SURGERY     wrist right , right knee  . INSERTION OF MESH N/A 04/17/2015   Procedure: INSERTION OF MESH;  Surgeon: Ralene Ok, MD;  Location: [place];  Service: General;  Laterality: N/A;  . KNEE SURGERY    . LEFT HEART CATHETERIZATION WITH CORONARY ANGIOGRAM N/A 12/05/2012   Procedure: LEFT HEART CATHETERIZATION WITH CORONARY ANGIOGRAM;  Surgeon: [name] Sine, MD;  Location: [facility] CATH LAB;  Service: Cardiovascular;  Laterality: N/A;  . right  breast surgery     d/t breast cancer  . TONSILLECTOMY     as a child  . TOTAL KNEE ARTHROPLASTY Right 06/19/2013   Procedure: TOTAL  KNEE ARTHROPLASTY;  Surgeon: Lorn Junes, MD;  Location: [place];  Service: Orthopedics;  Laterality: Right;  . TRANSURETHRAL RESECTION OF BLADDER TUMOR WITH GYRUS (TURBT-GYRUS)     01-26-18 Dr. Louis Meckel  . TRANSURETHRAL RESECTION OF BLADDER TUMOR WITH MITOMYCIN-C Bilateral 01/26/2018   Procedure: TRANSURETHRAL RESECTION OF BLADDER TUMOR WITH GEMCITABIN BLLATERAL RETROGRADE PYELOGRAM;  Surgeon: Ardis Hughs, MD;  [facility];  Service: Urology;  Laterality: Bilateral;  . TURBINATE REDUCTION    . UMBILICAL HERNIA REPAIR N/A 04/17/2015   Procedure: LAPAROSCOPIC UMBILICAL HERNIA REPAIR WITH MESH;  Surgeon: Ralene Ok, MD;  Location: [place];  Service: General;  Laterality: N/A;  . UPPER GASTROINTESTINAL ENDOSCOPY      There were no vitals filed for this visit.   Subjective Assessment - 03/12/20 1521    Subjective Paitnet reports her neck is about the same. She has taken tylenol the last few nights. She has tried the  exercises alittle. She comes in today with poor posture. She has her head forward and her shoulders rounded over the walker.    Pertinent History osteoperosis, HTN, brest cancer, depressio, lymphoma, osteopenia,    Limitations Standing;Walking    How long can you walk comfortably? Has to walk with the device. Can walk around the store if needed    Diagnostic tests 1. No acute abnormality of the thoracic spine.2. T9 vertebra plana with 6 mm retropulsion causing mild spinal canal stenosis.Lumbar MRI: L2-L5 multi level degenration    Patient Stated Goals to have less and falls and less pain in her neck    Currently in Pain? No/denies    Pain Score 3     Pain Location Neck    Pain Orientation Right;Left    Pain Descriptors / Indicators Aching    Pain Type Chronic pain    Pain Onset More than a month ago    Pain Frequency Constant    Aggravating Factors  turning the head    Pain Relieving Factors rest    Effect of Pain on Daily Activities difficulty perfroming ADL's                             [facility] Adult PT Treatment/Exercise - 03/13/20 0001      High Level Balance   High Level Balance Comments narrow base eyes opened SBA; eyes closed min a; tandem stance min a for balance all perfromed 2x30 sec hold each; rocking on sir-ex min a for psoterior balance.       Knee/Hip Exercises: Aerobic   Nustep 6 mins; L3; arms and legs      Knee/Hip Exercises: Seated   Other Seated Knee/Hip Exercises bilateral shoulder er 2x10 red; shoulder fleixon cane 2x10; horizontal baduction 2x10 red       Manual Therapy   Manual Therapy Soft tissue mobilization    Manual therapy comments MTPR for bil upper trap, grade/ IASTYM to upper trap and peri-scapual area     Soft tissue mobilization upper trap and peri-scapular area using IASTYM                   PT Education - 03/12/20 1524    Education Details upgrade band and talked about posture with the patient.    Person(s) Educated  Patient    Methods Demonstration;Tactile cues;Verbal cues;Explanation;Handout    Comprehension Returned demonstration;Verbal cues required;Tactile cues required;Need further instruction;Verbalized understanding  PT Short Term Goals - 02/27/20 1112      PT SHORT TERM GOAL #1   Title Therapy will review FOTO goals    Baseline reviewed with patient    Time 3    Period Weeks    Status Achieved      PT SHORT TERM GOAL #2   Title Therapy will perfrom BERG balance scale and set goal accordingly    Baseline performed BERG    Status Achieved      PT SHORT TERM GOAL #3   Title Patient will require CGA for narrow base balance    Baseline min A required at minimum to protect pt from losing balance    Time 3    Period Weeks    Status On-going    Target Date 03/19/20      PT SHORT TERM GOAL #4   Title Patient will reduce 5x sit to stand by 6 seconds    Baseline Pt requires cues for anterior scooting to edge of chair before standing with pt taking well over 30 seconds to perform 5 STS with B UE support from armrests on chair and CGA (req Min-Mod A to correct balance after 1st stand due to poor foot placement)    Time 3    Period Weeks    Status On-going    Target Date 03/19/20             PT Long Term Goals - 02/27/20 1123      PT LONG TERM GOAL #1   Title Patient will demonstrate a 35% limitation on FOTO    Time 3    Period Weeks    Status New      PT LONG TERM GOAL #2   Title Patient will demonstrate a <10 second sit to stand test to demonstrate decreased falls risk    Time 6    Period Weeks    Status New      PT LONG TERM GOAL #3   Title Patient will demonstrate a relaistic improvement in BERG testing to show a reduced falls risk ( level to be determined next visit)    Time 6    Period Weeks    Status New                 Plan - 03/12/20 1528    Clinical Impression Statement Patient reports that at times the neck gfeels better but it isitll hurts  at night. Therapy waited until she had follow up with neurologist before starting postural correction. She had not had a fall over the past 2 visaits. We will perfrom a BERG balance test on her next visit. It was not perfromed today 2nd to patient being later to treatment. Her tandem balance and narow base ave improved.    Comorbidities osteoperosis, anxiety, thoracic compression fracture    Examination-Activity Limitations Bend;Caring for Others;Carry;Dressing;Lift;Stand;Stairs;Squat;[name] Overhead;Locomotion Level    Examination-Participation Restrictions Cleaning;Meal Prep;Community Activity;Laundry;Shop    Stability/Clinical Decision Making Evolving/Moderate complexity    Clinical Decision Making Moderate    Rehab Potential Good    PT Frequency 2x / week    PT Duration 6 weeks    PT Treatment/Interventions ADLs/Self Care Home Management;Cryotherapy;Moist Heat;DME Instruction;Gait training;Stair training;Functional mobility training;Therapeutic activities;Therapeutic exercise;Neuromuscular re-education;Patient/family education;Manual techniques;Passive range of motion;Dry needling;Taping    PT Next Visit Plan continue to work on balance and strengthening exercises. New script recieved for cervical spine. Proceed with eval of the neck but be aware of thoracic compression fx. Neurosurgeon  scheduled end of Oct    PT Home Exercise Plan Access Code [email or web address]: supine SLR, PPT + march, standing heel raises, standing toe raises at counter with support. Sitting exs for LAQ, marching, hip add sets, and hip abd c Tband.    Consulted and Agree with Plan of Care Patient           Patient will benefit from skilled therapeutic intervention in order to improve the following deficits and impairments:  Abnormal gait, Decreased range of motion, Increased fascial restricitons, Pain, Postural dysfunction, Decreased activity tolerance, Improper body mechanics, Impaired perceived functional ability, Difficulty walking,  Decreased endurance, Decreased strength, Decreased safety awareness  Visit Diagnosis: Frequent falls  Abnormal posture  Cervicalgia     Problem List Patient Active Problem List   Diagnosis Date Noted  . Recurrent falls 11/16/2019  . Hypotension 11/15/2018  . Left shoulder pain 02/08/2018  . History of bladder cancer 02/08/2018  . Anemia, chronic disease 07/27/2017  . Weight loss 11/19/2015  . Chronic constipation 11/19/2015  . Other specified hypothyroidism 11/14/2015  . DJD (degenerative joint disease) of knee 06/19/2013  . Right knee DJD   . Hyperlipidemia   . Thyroid disease   . Chest pain 12/26/2012  . Unstable angina ([place]) 12/03/2012  . Osteoporosis 12/03/2012  . Palpitations 12/03/2012  . Dyslipidemia 12/03/2012  . History of breast cancer in female 07/06/2011  . History of non-Hodgkin's lymphoma 07/06/2011  . Hypothyroid 07/06/2011  . Chronic ITP (idiopathic thrombocytopenia) (HCC) 07/06/2011    Carney Living PT DPT  03/13/2020, 12:58 PM  [facility] [address] [place], [place], [address] Phone: [phone]   Fax:  [phone]  Name: Darlene [name] MRN: [id number] Date of Birth: 1935-03-10

## 2020-03-19 ENCOUNTER — Ambulatory Visit: Payer: Medicare Other | Admitting: Physical Therapy

## 2020-03-19 ENCOUNTER — Encounter: Payer: Self-pay | Admitting: Physical Therapy

## 2020-03-19 ENCOUNTER — Other Ambulatory Visit: Payer: Self-pay

## 2020-03-19 DIAGNOSIS — M542 Cervicalgia: Secondary | ICD-10-CM | POA: Diagnosis not present

## 2020-03-19 DIAGNOSIS — R296 Repeated falls: Secondary | ICD-10-CM

## 2020-03-19 DIAGNOSIS — R293 Abnormal posture: Secondary | ICD-10-CM | POA: Diagnosis not present

## 2020-03-20 ENCOUNTER — Encounter: Payer: Self-pay | Admitting: Physical Therapy

## 2020-03-20 NOTE — Therapy (Signed)
[place], [place], [address] Phone: [phone]   Fax:  [phone]  Physical Therapy Treatment  Patient Details  Name: Darlene [name] MRN: [id number] Date of Birth: 1934-05-23 Referring Provider (PT): Dr Narda Amber    Encounter Date: 03/19/2020   PT End of Session - 03/19/20 1622    Visit Number 13    Number of Visits 24    Date for PT Re-Evaluation 04/23/20    Authorization Type Medicare    PT Start Time 7026    PT Stop Time 3785    PT Time Calculation (min) 41 min    Activity Tolerance Patient tolerated treatment well    Behavior During Therapy [facility] for tasks assessed/performed           Past Medical History:  Diagnosis Date   Anxiety    Breast cancer, stage 1, estrogen receptor negative ([place]) 07/06/2011   Cancer of breast ([place])    Cataract    Chronic ITP (idiopathic thrombocytopenia) ([place]) 07/06/2011   Constipation    takes Colace daily   COPD (chronic obstructive pulmonary disease) ([place])    mild   Depression    "nervous break down age 65"   Fast heart beat    GERD (gastroesophageal reflux disease)    takes Nexium daily   History of colon polyps    History of depression    received shock treatments    History of palpitations    takes Metoprolol daily   History of vertigo    HOH (hard of hearing)    has hearing aids   Hyperlipidemia    takes Lovastatin daily   Hypertension    Hypothyroid 07/06/2011   takes SYnthroid daily   Joint pain    Joint swelling    Lymphoma ([place])    Lymphoma ([place])    B cell    Lymphoma of lymph nodes of head, face, and/or neck ([place]) 07/06/2011   Neuropathy    Nocturia    Osteopenia    Osteoporosis    hx of and take Reclast   Peripheral neuropathy    Personal history of chemotherapy    Personal history of radiation therapy    Right knee DJD    Shortness of breath dyspnea    with exertion   Thyroid disease     Umbilical hernia     Past Surgical History:  Procedure Laterality Date   ADENOIDECTOMY     as a child   BREAST LUMPECTOMY Right 1994   BREAST SURGERY Right    BUNIONECTOMY Right    CARDIAC CATHETERIZATION  2014   COLONOSCOPY     FOOT SURGERY Right    FRACTURE SURGERY     wrist right , right knee   INSERTION OF MESH N/A 04/17/2015   Procedure: INSERTION OF MESH;  Surgeon: Ralene Ok, MD;  Location: [place];  Service: General;  Laterality: N/A;   KNEE SURGERY     LEFT HEART CATHETERIZATION WITH CORONARY ANGIOGRAM N/A 12/05/2012   Procedure: LEFT HEART CATHETERIZATION WITH CORONARY ANGIOGRAM;  Surgeon: [name] Sine, MD;  Location: [facility] CATH LAB;  Service: Cardiovascular;  Laterality: N/A;   right  breast surgery     d/t breast cancer   TONSILLECTOMY     as a child   TOTAL KNEE ARTHROPLASTY Right 06/19/2013   Procedure: TOTAL KNEE ARTHROPLASTY;  Surgeon: Lorn Junes, MD;  Location: [place];  Service: Orthopedics;  Laterality: Right;   TRANSURETHRAL RESECTION  OF BLADDER TUMOR WITH GYRUS (TURBT-GYRUS)     01-26-18 Dr. Louis Meckel   TRANSURETHRAL RESECTION OF BLADDER TUMOR WITH MITOMYCIN-C Bilateral 01/26/2018   Procedure: TRANSURETHRAL RESECTION OF BLADDER TUMOR WITH GEMCITABIN BLLATERAL RETROGRADE PYELOGRAM;  Surgeon: Ardis Hughs, MD;  [facility];  Service: Urology;  Laterality: Bilateral;   TURBINATE REDUCTION     UMBILICAL HERNIA REPAIR N/A 04/17/2015   Procedure: LAPAROSCOPIC UMBILICAL HERNIA REPAIR WITH MESH;  Surgeon: Ralene Ok, MD;  Location: [place];  Service: General;  Laterality: N/A;   UPPER GASTROINTESTINAL ENDOSCOPY      There were no vitals filed for this visit.   Subjective Assessment - 03/19/20 1615    Subjective Patient reports she has been having very little neck pain.today or the past few days. She has been trying to be more aware of her posture at home. She has had no falls.    Pertinent History osteoperosis, HTN, brest cancer,  depressio, lymphoma, osteopenia,    Limitations Standing;Walking    How long can you walk comfortably? Has to walk with the device. Can walk around the store if needed    Diagnostic tests 1. No acute abnormality of the thoracic spine.2. T9 vertebra plana with 6 mm retropulsion causing mild spinal canal stenosis.Lumbar MRI: L2-L5 multi level degenration    Patient Stated Goals to have less and falls and less pain in her neck    Currently in Pain? No/denies                             [facility] Adult PT Treatment/Exercise - 03/20/20 0001      High Level Balance   High Level Balance Comments narrow base eyes opened SBA; eyes closed min a; tandem stance min a for balance all perfromed 2x30 sec hold each; rocking on air-ex min a for psoterior balance narrow base on air-ex eyes open mod a for balance       Knee/Hip Exercises: Seated   Other Seated Knee/Hip Exercises bilateral shoulder er 2x10 red; shoulder fleixon cane 2x10; horizontal baduction 2x10 red       Manual Therapy   Manual Therapy Soft tissue mobilization    Manual therapy comments MTPR for bil upper trap, grade/ IASTYM to upper trap and peri-scapual area     Soft tissue mobilization upper trap and peri-scapular area using IASTYM                   PT Education - 03/19/20 1621    Education Details eviewed HEP and symptom mangement    Person(s) Educated Patient    Methods Explanation;Demonstration;Tactile cues;Verbal cues    Comprehension Verbalized understanding;Returned demonstration;Verbal cues required;Tactile cues required            PT Short Term Goals - 02/27/20 1112      PT SHORT TERM GOAL #1   Title Therapy will review FOTO goals    Baseline reviewed with patient    Time 3    Period Weeks    Status Achieved      PT SHORT TERM GOAL #2   Title Therapy will perfrom BERG balance scale and set goal accordingly    Baseline performed BERG    Status Achieved      PT SHORT TERM GOAL #3   Title  Patient will require CGA for narrow base balance    Baseline min A required at minimum to protect pt from losing balance    Time  3    Period Weeks    Status On-going    Target Date 03/19/20      PT SHORT TERM GOAL #4   Title Patient will reduce 5x sit to stand by 6 seconds    Baseline Pt requires cues for anterior scooting to edge of chair before standing with pt taking well over 30 seconds to perform 5 STS with B UE support from armrests on chair and CGA (req Min-Mod A to correct balance after 1st stand due to poor foot placement)    Time 3    Period Weeks    Status On-going    Target Date 03/19/20             PT Long Term Goals - 02/27/20 1123      PT LONG TERM GOAL #1   Title Patient will demonstrate a 35% limitation on FOTO    Time 3    Period Weeks    Status New      PT LONG TERM GOAL #2   Title Patient will demonstrate a <10 second sit to stand test to demonstrate decreased falls risk    Time 6    Period Weeks    Status New      PT LONG TERM GOAL #3   Title Patient will demonstrate a relaistic improvement in BERG testing to show a reduced falls risk ( level to be determined next visit)    Time 6    Period Weeks    Status New                 Plan - 03/20/20 1034    Clinical Impression Statement Patient is having much less spasming in her upper trap. She has been more aware of her posutre. She has alos been trying to be more aware of her balance. She has not had a fall in a few weeks. She tolerated ther-ex well. She continues to require mod a for balance. She would benefit from further skilled therapy.    Personal Factors and Comorbidities Comorbidity 1;Comorbidity 2;Comorbidity 3+    Comorbidities osteoperosis, anxiety, thoracic compression fracture    Examination-Activity Limitations Bend;Caring for Others;Carry;Dressing;Lift;Stand;Stairs;Squat;Reach Overhead;Locomotion Level    Examination-Participation Restrictions Cleaning;Meal Prep;Community  Activity;Laundry;Shop    Stability/Clinical Decision Making Evolving/Moderate complexity    Clinical Decision Making Moderate    Rehab Potential Good    PT Frequency 2x / week    PT Duration 6 weeks    PT Treatment/Interventions ADLs/Self Care Home Management;Cryotherapy;Moist Heat;DME Instruction;Gait training;Stair training;Functional mobility training;Therapeutic activities;Therapeutic exercise;Neuromuscular re-education;Patient/family education;Manual techniques;Passive range of motion;Dry needling;Taping    PT Next Visit Plan continue to work on balance and strengthening exercises. New script recieved for cervical spine. Proceed with eval of the neck but be aware of thoracic compression fx. Neurosurgeon scheduled end of Oct    PT Home Exercise Plan Access Code [email or web address]: supine SLR, PPT + march, standing heel raises, standing toe raises at counter with support. Sitting exs for LAQ, marching, hip add sets, and hip abd c Tband.    Consulted and Agree with Plan of Care Patient           Patient will benefit from skilled therapeutic intervention in order to improve the following deficits and impairments:  Abnormal gait, Decreased range of motion, Increased fascial restricitons, Pain, Postural dysfunction, Decreased activity tolerance, Improper body mechanics, Impaired perceived functional ability, Difficulty walking, Decreased endurance, Decreased strength, Decreased safety awareness  Visit Diagnosis: Frequent falls  Abnormal posture  Cervicalgia  Problem List Patient Active Problem List   Diagnosis Date Noted   Recurrent falls 11/16/2019   Hypotension 11/15/2018   Left shoulder pain 02/08/2018   History of bladder cancer 02/08/2018   Anemia, chronic disease 07/27/2017   Weight loss 11/19/2015   Chronic constipation 11/19/2015   Other specified hypothyroidism 11/14/2015   DJD (degenerative joint disease) of knee 06/19/2013   Right knee DJD    Hyperlipidemia     Thyroid disease    Chest pain 12/26/2012   Unstable angina ([facility]) 12/03/2012   Osteoporosis 12/03/2012   Palpitations 12/03/2012   Dyslipidemia 12/03/2012   History of breast cancer in female 07/06/2011   History of non-Hodgkin's lymphoma 07/06/2011   Hypothyroid 07/06/2011   Chronic ITP (idiopathic thrombocytopenia) ([place]) 07/06/2011    Carney Living PT DPT  03/20/2020, 10:40 AM  [facility] [address] [place], [place], [address] Phone: [phone]   Fax:  [phone]  Name: Darlene [name] MRN: [id number] Date of Birth: 27-Dec-1934

## 2020-03-21 ENCOUNTER — Other Ambulatory Visit: Payer: Self-pay

## 2020-03-21 ENCOUNTER — Ambulatory Visit: Payer: Medicare Other | Admitting: Physical Therapy

## 2020-03-21 DIAGNOSIS — R296 Repeated falls: Secondary | ICD-10-CM

## 2020-03-21 DIAGNOSIS — R293 Abnormal posture: Secondary | ICD-10-CM | POA: Diagnosis not present

## 2020-03-21 DIAGNOSIS — M542 Cervicalgia: Secondary | ICD-10-CM | POA: Diagnosis not present

## 2020-03-21 NOTE — Therapy (Signed)
[place], [place], [address] Phone: [phone]   Fax:  [phone]  Physical Therapy Treatment  Patient Details  Name: Darlene [name] MRN: [id number] Date of Birth: [birth year] Referring Provider (PT): Dr Narda Amber    Encounter Date: 03/21/2020   PT End of Session - 03/21/20 1937    Visit Number 14    Number of Visits 24    Date for PT Re-Evaluation 04/23/20    Authorization Type Medicare    PT Start Time 1100    PT Stop Time 1140    PT Time Calculation (min) 40 min    Equipment Utilized During Treatment Other (comment);Gait belt    Activity Tolerance Patient tolerated treatment well    Behavior During Therapy [facility] for tasks assessed/performed           Past Medical History:  Diagnosis Date  . Anxiety   . Breast cancer, stage 1, estrogen receptor negative ([place]) 07/06/2011  . Cancer of breast ([place])   . Cataract   . Chronic ITP (idiopathic thrombocytopenia) ([facility]) 07/06/2011  . Constipation    takes Colace daily  . COPD (chronic obstructive pulmonary disease) ([facility])    mild  . Depression    "nervous break down age 44"  . Fast heart beat   . GERD (gastroesophageal reflux disease)    takes Nexium daily  . History of colon polyps   . History of depression    received shock treatments   . History of palpitations    takes Metoprolol daily  . History of vertigo   . HOH (hard of hearing)    has hearing aids  . Hyperlipidemia    takes Lovastatin daily  . Hypertension   . Hypothyroid 07/06/2011   takes SYnthroid daily  . Joint pain   . Joint swelling   . Lymphoma ([place])   . Lymphoma ([place])    B cell   . Lymphoma of lymph nodes of head, face, and/or neck ([place]) 07/06/2011  . Neuropathy   . Nocturia   . Osteopenia   . Osteoporosis    hx of and take Reclast  . Peripheral neuropathy   . Personal history of chemotherapy   . Personal history of radiation therapy   . Right knee DJD   . Shortness  of breath dyspnea    with exertion  . Thyroid disease   . Umbilical hernia     Past Surgical History:  Procedure Laterality Date  . ADENOIDECTOMY     as a child  . BREAST LUMPECTOMY Right 1994  . BREAST SURGERY Right   . BUNIONECTOMY Right   . CARDIAC CATHETERIZATION  2014  . COLONOSCOPY    . FOOT SURGERY Right   . FRACTURE SURGERY     wrist right , right knee  . INSERTION OF MESH N/A 04/17/2015   Procedure: INSERTION OF MESH;  Surgeon: Ralene Ok, MD;  Location: [place];  Service: General;  Laterality: N/A;  . KNEE SURGERY    . LEFT HEART CATHETERIZATION WITH CORONARY ANGIOGRAM N/A 12/05/2012   Procedure: LEFT HEART CATHETERIZATION WITH CORONARY ANGIOGRAM;  Surgeon: [name] Sine, MD;  Location: [facility] CATH LAB;  Service: Cardiovascular;  Laterality: N/A;  . right  breast surgery     d/t breast cancer  . TONSILLECTOMY     as a child  . TOTAL KNEE ARTHROPLASTY Right 06/19/2013   Procedure: TOTAL KNEE ARTHROPLASTY;  Surgeon: Lorn Junes, MD;  Location: [place];  Service: Orthopedics;  Laterality: Right;  . TRANSURETHRAL RESECTION OF BLADDER TUMOR WITH GYRUS (TURBT-GYRUS)     01-26-18 Dr. Louis Meckel  . TRANSURETHRAL RESECTION OF BLADDER TUMOR WITH MITOMYCIN-C Bilateral 01/26/2018   Procedure: TRANSURETHRAL RESECTION OF BLADDER TUMOR WITH GEMCITABIN BLLATERAL RETROGRADE PYELOGRAM;  Surgeon: Ardis Hughs, MD;  Location[facility];  Service: Urology;  Laterality: Bilateral;  . TURBINATE REDUCTION    . UMBILICAL HERNIA REPAIR N/A 04/17/2015   Procedure: LAPAROSCOPIC UMBILICAL HERNIA REPAIR WITH MESH;  Surgeon: Ralene Ok, MD;  Location: [place];  Service: General;  Laterality: N/A;  . UPPER GASTROINTESTINAL ENDOSCOPY      There were no vitals filed for this visit.   Subjective Assessment - 03/21/20 1933    Subjective Patient continues to have very little neck pain. She reported minor shoulder soreness after last visit. She reports no fallssince the previous visit. Overall she has  not had a fall in several weeks.    Pertinent History osteoperosis, HTN, brest cancer, depressio, lymphoma, osteopenia,    Limitations Standing;Walking    How long can you walk comfortably? Has to walk with the device. Can walk around the store if needed    Diagnostic tests 1. No acute abnormality of the thoracic spine.2. T9 vertebra plana with 6 mm retropulsion causing mild spinal canal stenosis.Lumbar MRI: L2-L5 multi level degenration    Patient Stated Goals to have less and falls and less pain in her neck    Currently in Pain? No/denies    Pain Score 3     Pain Location Neck    Pain Orientation Right;Left    Pain Descriptors / Indicators Aching    Pain Type Chronic pain    Pain Onset More than a month ago    Aggravating Factors  turning her head    Pain Relieving Factors rest    Effect of Pain on Daily Activities difficuly perfroming ADL's                             [facility] Adult PT Treatment/Exercise - 03/21/20 0001      High Level Balance   High Level Balance Comments narrow base eyes opened SBA; eyes closed min a; tandem stance min a for balance all perfromed 2x30 sec hold each; rocking on air-ex min a for psoterior balance narrow base on air-ex eyes open mod a for balance       Knee/Hip Exercises: Standing   Heel Raises 3 sets;10 reps      Knee/Hip Exercises: Seated   Long Arc Quad Strengthening;Both;10 reps;3 sets    Ball Squeeze 3x10 ; 3sec    Clamshell with TheraBand --   3x10 green    Marching Strengthening;Right;Left;2 sets;10 reps;3 sets    Marching Weights 3 lbs.    Sit to [employer] 2 sets;5 reps                  PT Education - 03/21/20 1935    Education Details beneftis of balance exercises    Person(s) Educated Patient    Methods Explanation;Demonstration;Tactile cues;Verbal cues    Comprehension Verbalized understanding;Returned demonstration;Verbal cues required;Tactile cues required            PT Short Term Goals - 02/27/20 1112        PT SHORT TERM GOAL #1   Title Therapy will review FOTO goals    Baseline reviewed with patient    Time 3    Period Weeks  Status Achieved      PT SHORT TERM GOAL #2   Title Therapy will perfrom BERG balance scale and set goal accordingly    Baseline performed BERG    Status Achieved      PT SHORT TERM GOAL #3   Title Patient will require CGA for narrow base balance    Baseline min A required at minimum to protect pt from losing balance    Time 3    Period Weeks    Status On-going    Target Date 03/19/20      PT SHORT TERM GOAL #4   Title Patient will reduce 5x sit to stand by 6 seconds    Baseline Pt requires cues for anterior scooting to edge of chair before standing with pt taking well over 30 seconds to perform 5 STS with B UE support from armrests on chair and CGA (req Min-Mod A to correct balance after 1st stand due to poor foot placement)    Time 3    Period Weeks    Status On-going    Target Date 03/19/20             PT Long Term Goals - 02/27/20 1123      PT LONG TERM GOAL #1   Title Patient will demonstrate a 35% limitation on FOTO    Time 3    Period Weeks    Status New      PT LONG TERM GOAL #2   Title Patient will demonstrate a <10 second sit to stand test to demonstrate decreased falls risk    Time 6    Period Weeks    Status New      PT LONG TERM GOAL #3   Title Patient will demonstrate a relaistic improvement in BERG testing to show a reduced falls risk ( level to be determined next visit)    Time 6    Period Weeks    Status New                 Plan - 03/21/20 1941    Clinical Impression Statement Patient is making progreess. She has not had any neck pain or fallsover the past 2 visits. Therapy focused on LE strengthening today. She is consitently requireing less assidtance on the air-ex. She was able to drift further backwards today without loosing her balance. Therapy will continue to progress.    Personal Factors and  Comorbidities Comorbidity 1;Comorbidity 2;Comorbidity 3+    Comorbidities osteoperosis, anxiety, thoracic compression fracture    Examination-Activity Limitations Bend;Caring for Others;Carry;Dressing;Lift;Stand;Stairs;Squat;Reach Overhead;Locomotion Level    Examination-Participation Restrictions Cleaning;Meal Prep;Community Activity;Laundry;Shop    Stability/Clinical Decision Making Evolving/Moderate complexity    Clinical Decision Making Moderate    Rehab Potential Good    PT Frequency 2x / week    PT Duration 6 weeks    PT Treatment/Interventions ADLs/Self Care Home Management;Cryotherapy;Moist Heat;DME Instruction;Gait training;Stair training;Functional mobility training;Therapeutic activities;Therapeutic exercise;Neuromuscular re-education;Patient/family education;Manual techniques;Passive range of motion;Dry needling;Taping    PT Next Visit Plan continue to work on balance and strengthening exercises. New script recieved for cervical spine. Proceed with eval of the neck but be aware of thoracic compression fx. Neurosurgeon scheduled end of Oct    PT Home Exercise Plan Access Code [email or web address]: supine SLR, PPT + march, standing heel raises, standing toe raises at counter with support. Sitting exs for LAQ, marching, hip add sets, and hip abd c Tband.    Consulted and Agree with Plan of Care Patient  Patient will benefit from skilled therapeutic intervention in order to improve the following deficits and impairments:  Abnormal gait, Decreased range of motion, Increased fascial restricitons, Pain, Postural dysfunction, Decreased activity tolerance, Improper body mechanics, Impaired perceived functional ability, Difficulty walking, Decreased endurance, Decreased strength, Decreased safety awareness  Visit Diagnosis: Frequent falls  Abnormal posture  Cervicalgia     Problem List Patient Active Problem List   Diagnosis Date Noted  . Recurrent falls 11/16/2019  . Hypotension  11/15/2018  . Left shoulder pain 02/08/2018  . History of bladder cancer 02/08/2018  . Anemia, chronic disease 07/27/2017  . Weight loss 11/19/2015  . Chronic constipation 11/19/2015  . Other specified hypothyroidism 11/14/2015  . DJD (degenerative joint disease) of knee 06/19/2013  . Right knee DJD   . Hyperlipidemia   . Thyroid disease   . Chest pain 12/26/2012  . Unstable angina ([place]) 12/03/2012  . Osteoporosis 12/03/2012  . Palpitations 12/03/2012  . Dyslipidemia 12/03/2012  . History of breast cancer in female 07/06/2011  . History of non-Hodgkin's lymphoma 07/06/2011  . Hypothyroid 07/06/2011  . Chronic ITP (idiopathic thrombocytopenia) ([facility]) 07/06/2011    Carney Living PT DPT  03/21/2020, 7:51 PM  [facility] [address] [place], [place], [address] Phone: [phone]   Fax:  [phone]  Name: Darlene [name] MRN: [id number] Date of Birth: [birth year]

## 2020-03-26 ENCOUNTER — Other Ambulatory Visit: Payer: Self-pay

## 2020-03-26 ENCOUNTER — Ambulatory Visit: Payer: Medicare Other | Admitting: Physical Therapy

## 2020-03-26 ENCOUNTER — Encounter: Payer: Self-pay | Admitting: Physical Therapy

## 2020-03-26 DIAGNOSIS — R293 Abnormal posture: Secondary | ICD-10-CM | POA: Diagnosis not present

## 2020-03-26 DIAGNOSIS — M542 Cervicalgia: Secondary | ICD-10-CM | POA: Diagnosis not present

## 2020-03-26 DIAGNOSIS — R296 Repeated falls: Secondary | ICD-10-CM

## 2020-03-27 ENCOUNTER — Encounter: Payer: Self-pay | Admitting: Physical Therapy

## 2020-03-27 NOTE — Therapy (Signed)
[place], [place], [address] Phone: [phone]   Fax:  [phone]  Physical Therapy Treatment  Patient Details  Name: Darlene [name] MRN: [id number] Date of Birth: 03/21/1935 Referring Provider (PT): Dr Narda Amber    Encounter Date: 03/26/2020   PT End of Session - 03/26/20 1118    Visit Number 15    Number of Visits 24    Date for PT Re-Evaluation 04/23/20    Authorization Type Medicare    PT Start Time 1101    PT Stop Time 1145    PT Time Calculation (min) 44 min    Equipment Utilized During Treatment Other (comment);Gait belt    Activity Tolerance Patient tolerated treatment well    Behavior During Therapy [facility] for tasks assessed/performed           Past Medical History:  Diagnosis Date  . Anxiety   . Breast cancer, stage 1, estrogen receptor negative ([place]) 07/06/2011  . Cancer of breast ([place])   . Cataract   . Chronic ITP (idiopathic thrombocytopenia) (H[facility]) 07/06/2011  . Constipation    takes Colace daily  . COPD (chronic obstructive pulmonary disease) (H[facility])    mild  . Depression    "nervous break down age 61"  . Fast heart beat   . GERD (gastroesophageal reflux disease)    takes Nexium daily  . History of colon polyps   . History of depression    received shock treatments   . History of palpitations    takes Metoprolol daily  . History of vertigo   . HOH (hard of hearing)    has hearing aids  . Hyperlipidemia    takes Lovastatin daily  . Hypertension   . Hypothyroid 07/06/2011   takes SYnthroid daily  . Joint pain   . Joint swelling   . Lymphoma ([place])   . Lymphoma ([place])    B cell   . Lymphoma of lymph nodes of head, face, and/or neck ([place]) 07/06/2011  . Neuropathy   . Nocturia   . Osteopenia   . Osteoporosis    hx of and take Reclast  . Peripheral neuropathy   . Personal history of chemotherapy   . Personal history of radiation therapy   . Right knee DJD   . Shortness  of breath dyspnea    with exertion  . Thyroid disease   . Umbilical hernia     Past Surgical History:  Procedure Laterality Date  . ADENOIDECTOMY     as a child  . BREAST LUMPECTOMY Right 1994  . BREAST SURGERY Right   . BUNIONECTOMY Right   . CARDIAC CATHETERIZATION  2014  . COLONOSCOPY    . FOOT SURGERY Right   . FRACTURE SURGERY     wrist right , right knee  . INSERTION OF MESH N/A 04/17/2015   Procedure: INSERTION OF MESH;  Surgeon: Ralene Ok, MD;  Location: [place];  Service: General;  Laterality: N/A;  . KNEE SURGERY    . LEFT HEART CATHETERIZATION WITH CORONARY ANGIOGRAM N/A 12/05/2012   Procedure: LEFT HEART CATHETERIZATION WITH CORONARY ANGIOGRAM;  Surgeon: [name] Sine, MD;  [facility] CATH LAB;  Service: Cardiovascular;  Laterality: N/A;  . right  breast surgery     d/t breast cancer  . TONSILLECTOMY     as a child  . TOTAL KNEE ARTHROPLASTY Right 06/19/2013   Procedure: TOTAL KNEE ARTHROPLASTY;  Surgeon: Lorn Junes, MD;  Location: [place];  Service: Orthopedics;  Laterality: Right;  . TRANSURETHRAL RESECTION OF BLADDER TUMOR WITH GYRUS (TURBT-GYRUS)     01-26-18 Dr. Louis Meckel  . TRANSURETHRAL RESECTION OF BLADDER TUMOR WITH MITOMYCIN-C Bilateral 01/26/2018   Procedure: TRANSURETHRAL RESECTION OF BLADDER TUMOR WITH GEMCITABIN BLLATERAL RETROGRADE PYELOGRAM;  Surgeon: Ardis Hughs, MD;  Location: [facility];  Service: Urology;  Laterality: Bilateral;  . TURBINATE REDUCTION    . UMBILICAL HERNIA REPAIR N/A 04/17/2015   Procedure: LAPAROSCOPIC UMBILICAL HERNIA REPAIR WITH MESH;  Surgeon: Ralene Ok, MD;  Location: [place];  Service: General;  Laterality: N/A;  . UPPER GASTROINTESTINAL ENDOSCOPY      There were no vitals filed for this visit.   Subjective Assessment - 03/26/20 1312    Subjective Patient continues to report minimla cervical pain. She has been trying to be more aware of her posture but she des forget. She has not had any falls recently.     Pertinent History osteoperosis, HTN, brest cancer, depressio, lymphoma, osteopenia,    Limitations Standing;Walking    How long can you walk comfortably? Has to walk with the device. Can walk around the store if needed    Diagnostic tests 1. No acute abnormality of the thoracic spine.2. T9 vertebra plana with 6 mm retropulsion causing mild spinal canal stenosis.Lumbar MRI: L2-L5 multi level degenration    Patient Stated Goals to have less and falls and less pain in her neck    Currently in Pain? No/denies    Pain Onset More than a month ago                             [facility] Adult PT Treatment/Exercise - 03/27/20 0001      High Level Balance   High Level Balance Comments narrow base eyes opened SBA; eyes closed min a; tandem stance min a for balance all perfromed 2x30 sec hold each; rocking on air-ex min a for psoterior balance narrow base on air-ex eyes open mod a for balance       Knee/Hip Exercises: Standing   Heel Raises 3 sets;10 reps      Knee/Hip Exercises: Seated   Sit to Sand 2 sets;5 reps      Knee/Hip Exercises: Supine   Other Supine Knee/Hip Exercises bilateral ER red 2x10; horizontal abduction 2x10       Manual Therapy   Manual Therapy Soft tissue mobilization    Manual therapy comments MTPR for bil upper trap, grade/ IASTYM to upper trap and peri-scapual area     Soft tissue mobilization upper trap and peri-scapular area using IASTYM                   PT Education - 03/26/20 1339    Education Details reviewed HEP and symptom mangement    Person(s) Educated Patient    Methods Explanation;Demonstration;Tactile cues;Verbal cues    Comprehension Verbalized understanding;Returned demonstration;Verbal cues required;Tactile cues required            PT Short Term Goals - 02/27/20 1112      PT SHORT TERM GOAL #1   Title Therapy will review FOTO goals    Baseline reviewed with patient    Time 3    Period Weeks    Status Achieved      PT  SHORT TERM GOAL #2   Title Therapy will perfrom BERG balance scale and set goal accordingly    Baseline performed BERG    Status Achieved  PT SHORT TERM GOAL #3   Title Patient will require CGA for narrow base balance    Baseline min A required at minimum to protect pt from losing balance    Time 3    Period Weeks    Status On-going    Target Date 03/19/20      PT SHORT TERM GOAL #4   Title Patient will reduce 5x sit to stand by 6 seconds    Baseline Pt requires cues for anterior scooting to edge of chair before standing with pt taking well over 30 seconds to perform 5 STS with B UE support from armrests on chair and CGA (req Min-Mod A to correct balance after 1st stand due to poor foot placement)    Time 3    Period Weeks    Status On-going    Target Date 03/19/20             PT Long Term Goals - 02/27/20 1123      PT LONG TERM GOAL #1   Title Patient will demonstrate a 35% limitation on FOTO    Time 3    Period Weeks    Status New      PT LONG TERM GOAL #2   Title Patient will demonstrate a <10 second sit to stand test to demonstrate decreased falls risk    Time 6    Period Weeks    Status New      PT LONG TERM GOAL #3   Title Patient will demonstrate a relaistic improvement in BERG testing to show a reduced falls risk ( level to be determined next visit)    Time 6    Period Weeks    Status New                 Plan - 03/26/20 1339    Clinical Impression Statement Patient continues to require cuing for her posture but she can correct her posture well. She had an improvement in cervical rotation R to 53 degrees. Her right shoulder strength has improved as well although she has pain with end range flexion. Therapy focused on balance today 2nd to her knees being dore. We did still review postural exercises with very little pain. She will schedule out through [employer] and Comorbidities Comorbidity 1;Comorbidity 2;Comorbidity 3+     Comorbidities osteoperosis, anxiety, thoracic compression fracture    Examination-Activity Limitations Bend;Caring for Others;Carry;Dressing;Lift;Stand;Stairs;Squat;Reach Overhead;Locomotion Level    Examination-Participation Restrictions Cleaning;Meal Prep;Community Activity;Laundry;Shop    Stability/Clinical Decision Making Evolving/Moderate complexity    Clinical Decision Making Moderate    Rehab Potential Good    PT Frequency 2x / week    PT Duration 6 weeks    PT Treatment/Interventions ADLs/Self Care Home Management;Cryotherapy;Moist Heat;DME Instruction;Gait training;Stair training;Functional mobility training;Therapeutic activities;Therapeutic exercise;Neuromuscular re-education;Patient/family education;Manual techniques;Passive range of motion;Dry needling;Taping    PT Next Visit Plan continue to work on balance and strengthening exercises. New script recieved for cervical spine. Proceed with eval of the neck but be aware of thoracic compression fx. Neurosurgeon scheduled end of Oct    PT Home Exercise Plan Access Code [email or web address]: supine SLR, PPT + march, standing heel raises, standing toe raises at counter with support. Sitting exs for LAQ, marching, hip add sets, and hip abd c Tband.           Patient will benefit from skilled therapeutic intervention in order to improve the following deficits and impairments:  Abnormal gait, Decreased range of motion, Increased  fascial restricitons, Pain, Postural dysfunction, Decreased activity tolerance, Improper body mechanics, Impaired perceived functional ability, Difficulty walking, Decreased endurance, Decreased strength, Decreased safety awareness  Visit Diagnosis: Frequent falls  Abnormal posture  Cervicalgia     Problem List Patient Active Problem List   Diagnosis Date Noted  . Recurrent falls 11/16/2019  . Hypotension 11/15/2018  . Left shoulder pain 02/08/2018  . History of bladder cancer 02/08/2018  . Anemia, chronic disease  07/27/2017  . Weight loss 11/19/2015  . Chronic constipation 11/19/2015  . Other specified hypothyroidism 11/14/2015  . DJD (degenerative joint disease) of knee 06/19/2013  . Right knee DJD   . Hyperlipidemia   . Thyroid disease   . Chest pain 12/26/2012  . Unstable angina ([place]) 12/03/2012  . Osteoporosis 12/03/2012  . Palpitations 12/03/2012  . Dyslipidemia 12/03/2012  . History of breast cancer in female 07/06/2011  . History of non-Hodgkin's lymphoma 07/06/2011  . Hypothyroid 07/06/2011  . Chronic ITP (idiopathic thrombocytopenia) (HCC) 07/06/2011    Carney Living 03/27/2020, 10:35 AM  [facility] [address] [place], [place], [address] Phone: [phone]   Fax:  [phone]  Name: TOMEKA KANTNER MRN: [id number] Date of Birth: 07-31-1934

## 2020-04-02 ENCOUNTER — Ambulatory Visit: Payer: Medicare Other

## 2020-04-02 ENCOUNTER — Other Ambulatory Visit: Payer: Self-pay

## 2020-04-02 DIAGNOSIS — R296 Repeated falls: Secondary | ICD-10-CM | POA: Diagnosis not present

## 2020-04-02 DIAGNOSIS — R293 Abnormal posture: Secondary | ICD-10-CM | POA: Diagnosis not present

## 2020-04-02 DIAGNOSIS — M542 Cervicalgia: Secondary | ICD-10-CM

## 2020-04-02 NOTE — Therapy (Signed)
[place], [place], [address] Phone: [phone]   Fax:  [phone]  Physical Therapy Treatment  Patient Details  Name: Darlene [name] MRN: [id number] Date of Birth: [birth year] Referring Provider (PT): Dr Narda Amber    Encounter Date: 04/02/2020   PT End of Session - 04/02/20 1109    Visit Number 16    Number of Visits 24    Date for PT Re-Evaluation 04/23/20    Authorization Type Medicare    PT Start Time 4944    PT Stop Time 1145    PT Time Calculation (min) 43 min    Equipment Utilized During Treatment Other (comment);Gait belt    Activity Tolerance Patient tolerated treatment well    Behavior During Therapy [facility] for tasks assessed/performed           Past Medical History:  Diagnosis Date   Anxiety    Breast cancer, stage 1, estrogen receptor negative ([place]) 07/06/2011   Cancer of breast ([place])    Cataract    Chronic ITP (idiopathic thrombocytopenia) ([place]) 07/06/2011   Constipation    takes Colace daily   COPD (chronic obstructive pulmonary disease) ([place])    mild   Depression    "nervous break down age 54"   Fast heart beat    GERD (gastroesophageal reflux disease)    takes Nexium daily   History of colon polyps    History of depression    received shock treatments    History of palpitations    takes Metoprolol daily   History of vertigo    HOH (hard of hearing)    has hearing aids   Hyperlipidemia    takes Lovastatin daily   Hypertension    Hypothyroid 07/06/2011   takes SYnthroid daily   Joint pain    Joint swelling    Lymphoma ([place])    Lymphoma ([place])    B cell    Lymphoma of lymph nodes of head, face, and/or neck ([place]) 07/06/2011   Neuropathy    Nocturia    Osteopenia    Osteoporosis    hx of and take Reclast   Peripheral neuropathy    Personal history of chemotherapy    Personal history of radiation therapy    Right knee DJD    Shortness  of breath dyspnea    with exertion   Thyroid disease    Umbilical hernia     Past Surgical History:  Procedure Laterality Date   ADENOIDECTOMY     as a child   BREAST LUMPECTOMY Right 1994   BREAST SURGERY Right    BUNIONECTOMY Right    CARDIAC CATHETERIZATION  2014   COLONOSCOPY     FOOT SURGERY Right    FRACTURE SURGERY     wrist right , right knee   INSERTION OF MESH N/A 04/17/2015   Procedure: INSERTION OF MESH;  Surgeon: Ralene Ok, MD;  Location: [place];  Service: General;  Laterality: N/A;   KNEE SURGERY     LEFT HEART CATHETERIZATION WITH CORONARY ANGIOGRAM N/A 12/05/2012   Procedure: LEFT HEART CATHETERIZATION WITH CORONARY ANGIOGRAM;  Surgeon: [name] Sine, MD;  Location: [facility] CATH LAB;  Service: Cardiovascular;  Laterality: N/A;   right  breast surgery     d/t breast cancer   TONSILLECTOMY     as a child   TOTAL KNEE ARTHROPLASTY Right 06/19/2013   Procedure: TOTAL KNEE ARTHROPLASTY;  Surgeon: Lorn Junes, MD;  Location: [place];  Service: Orthopedics;  Laterality: Right;   TRANSURETHRAL RESECTION OF BLADDER TUMOR WITH GYRUS (TURBT-GYRUS)     01-26-18 Dr. Louis Meckel   TRANSURETHRAL RESECTION OF BLADDER TUMOR WITH MITOMYCIN-C Bilateral 01/26/2018   Procedure: TRANSURETHRAL RESECTION OF BLADDER TUMOR WITH GEMCITABIN BLLATERAL RETROGRADE PYELOGRAM;  Surgeon: Ardis Hughs, MD;  [facility];  Service: Urology;  Laterality: Bilateral;   TURBINATE REDUCTION     UMBILICAL HERNIA REPAIR N/A 04/17/2015   Procedure: LAPAROSCOPIC UMBILICAL HERNIA REPAIR WITH MESH;  Surgeon: Ralene Ok, MD;  Location: [place];  Service: General;  Laterality: N/A;   UPPER GASTROINTESTINAL ENDOSCOPY      There were no vitals filed for this visit.   Subjective Assessment - 04/02/20 1108    Subjective Pt reports her neck is ok today, but it has its moments.    Pertinent History osteoperosis, HTN, brest cancer, depressio, lymphoma, osteopenia,    Limitations  Standing;Walking    How long can you walk comfortably? Has to walk with the device. Can walk around the store if needed    Diagnostic tests 1. No acute abnormality of the thoracic spine.2. T9 vertebra plana with 6 mm retropulsion causing mild spinal canal stenosis.Lumbar MRI: L2-L5 multi level degenration    Patient Stated Goals to have less and falls and less pain in her neck    Currently in Pain? No/denies    Pain Onset More than a month ago                             [facility] Adult PT Treatment/Exercise - 04/02/20 0001      Knee/Hip Exercises: Standing   Heel Raises 2 sets;10 reps    Other Standing Knee Exercises Marching with CKC L hip (PT heavily abducting/extending L hip from sacrum/greater trochanter) 2 x10    Other Standing Knee Exercises Standing postural alignment      Knee/Hip Exercises: Seated   Sit to Sand 2 sets;5 reps      Knee/Hip Exercises: Supine   Other Supine Knee/Hip Exercises H/L Clams 1 x 20    Other Supine Knee/Hip Exercises bilateral ER red 2x10; horizontal abduction 2x10    upper body                   PT Short Term Goals - 02/27/20 1112      PT SHORT TERM GOAL #1   Title Therapy will review FOTO goals    Baseline reviewed with patient    Time 3    Period Weeks    Status Achieved      PT SHORT TERM GOAL #2   Title Therapy will perfrom BERG balance scale and set goal accordingly    Baseline performed BERG    Status Achieved      PT SHORT TERM GOAL #3   Title Patient will require CGA for narrow base balance    Baseline min A required at minimum to protect pt from losing balance    Time 3    Period Weeks    Status On-going    Target Date 03/19/20      PT SHORT TERM GOAL #4   Title Patient will reduce 5x sit to stand by 6 seconds    Baseline Pt requires cues for anterior scooting to edge of chair before standing with pt taking well over 30 seconds to perform 5 STS with B UE support from armrests on chair and CGA (req  Min-Mod A  to correct balance after 1st stand due to poor foot placement)    Time 3    Period Weeks    Status On-going    Target Date 03/19/20             PT Long Term Goals - 02/27/20 1123      PT LONG TERM GOAL #1   Title Patient will demonstrate a 35% limitation on FOTO    Time 3    Period Weeks    Status New      PT LONG TERM GOAL #2   Title Patient will demonstrate a <10 second sit to stand test to demonstrate decreased falls risk    Time 6    Period Weeks    Status New      PT LONG TERM GOAL #3   Title Patient will demonstrate a relaistic improvement in BERG testing to show a reduced falls risk ( level to be determined next visit)    Time 6    Period Weeks    Status New                 Plan - 04/02/20 1109    Clinical Impression Statement Pt continues to require cueing for appropriate posture; able to maintain for brief periods before onset of fatigue. Focused session on LE strength and balance at sink with emphasis on eccentrics of sit < >stand with pt able to control stand to sit with heavy effort and subsequent SOB.    Personal Factors and Comorbidities Comorbidity 1;Comorbidity 2;Comorbidity 3+    Comorbidities osteoperosis, anxiety, thoracic compression fracture    Examination-Activity Limitations Bend;Caring for Others;Carry;Dressing;Lift;Stand;Stairs;Squat;Reach Overhead;Locomotion Level    Examination-Participation Restrictions Cleaning;Meal Prep;Community Activity;Laundry;Shop    Stability/Clinical Decision Making Evolving/Moderate complexity    Rehab Potential Good    PT Frequency 2x / week    PT Duration 6 weeks    PT Treatment/Interventions ADLs/Self Care Home Management;Cryotherapy;Moist Heat;DME Instruction;Gait training;Stair training;Functional mobility training;Therapeutic activities;Therapeutic exercise;Neuromuscular re-education;Patient/family education;Manual techniques;Passive range of motion;Dry needling;Taping    PT Next Visit Plan  continue to work on balance and strengthening exercises. New script recieved for cervical spine. Proceed with eval of the neck but be aware of thoracic compression fx. Neurosurgeon scheduled end of Oct    PT Home Exercise Plan Access Code [email or web address]: supine SLR, PPT + march, standing heel raises, standing toe raises at counter with support. Sitting exs for LAQ, marching, hip add sets, and hip abd c Tband.    Consulted and Agree with Plan of Care Patient           Patient will benefit from skilled therapeutic intervention in order to improve the following deficits and impairments:  Abnormal gait, Decreased range of motion, Increased fascial restricitons, Pain, Postural dysfunction, Decreased activity tolerance, Improper body mechanics, Impaired perceived functional ability, Difficulty walking, Decreased endurance, Decreased strength, Decreased safety awareness  Visit Diagnosis: Frequent falls  Abnormal posture  Cervicalgia     Problem List Patient Active Problem List   Diagnosis Date Noted   Recurrent falls 11/16/2019   Hypotension 11/15/2018   Left shoulder pain 02/08/2018   History of bladder cancer 02/08/2018   Anemia, chronic disease 07/27/2017   Weight loss 11/19/2015   Chronic constipation 11/19/2015   Other specified hypothyroidism 11/14/2015   DJD (degenerative joint disease) of knee 06/19/2013   Right knee DJD    Hyperlipidemia    Thyroid disease    Chest pain 12/26/2012   Unstable angina (HCC) 12/03/2012   Osteoporosis  12/03/2012   Palpitations 12/03/2012   Dyslipidemia 12/03/2012   History of breast cancer in female 07/06/2011   History of non-Hodgkin's lymphoma 07/06/2011   Hypothyroid 07/06/2011   Chronic ITP (idiopathic thrombocytopenia) ([place]) 07/06/2011    Izell Forrest, PT, DPT 04/02/2020, 11:56 AM  [facility] [address] [place], [place], [address] Phone: [phone]   Fax:   [phone]  Name: TEVA BRONKEMA MRN: [id number] Date of Birth: 1935-01-26

## 2020-04-09 ENCOUNTER — Ambulatory Visit: Payer: Medicare Other | Attending: Neurology

## 2020-04-09 ENCOUNTER — Other Ambulatory Visit: Payer: Self-pay

## 2020-04-09 DIAGNOSIS — R296 Repeated falls: Secondary | ICD-10-CM | POA: Diagnosis not present

## 2020-04-09 DIAGNOSIS — M542 Cervicalgia: Secondary | ICD-10-CM | POA: Diagnosis not present

## 2020-04-09 DIAGNOSIS — Z23 Encounter for immunization: Secondary | ICD-10-CM | POA: Diagnosis not present

## 2020-04-09 DIAGNOSIS — R293 Abnormal posture: Secondary | ICD-10-CM | POA: Insufficient documentation

## 2020-04-09 NOTE — Therapy (Signed)
[place], [place], [address] Phone: [phone]   Fax:  [phone]  Physical Therapy Treatment  Patient Details  Name: Darlene [name] MRN: [id number] Date of Birth: 1934/10/12 Referring Provider (PT): Dr Narda Amber    Encounter Date: 04/09/2020   PT End of Session - 04/09/20 1341    Visit Number 17    Number of Visits 24    Date for PT Re-Evaluation 04/23/20    Authorization Type Medicare    PT Start Time 1330    PT Stop Time 1410    PT Time Calculation (min) 40 min    Equipment Utilized During Treatment Other (comment);Gait belt    Activity Tolerance Patient tolerated treatment well    Behavior During Therapy [facility] for tasks assessed/performed           Past Medical History:  Diagnosis Date  . Anxiety   . Breast cancer, stage 1, estrogen receptor negative ([place]) 07/06/2011  . Cancer of breast ([place])   . Cataract   . Chronic ITP (idiopathic thrombocytopenia) (HCC) 07/06/2011  . Constipation    takes Colace daily  . COPD (chronic obstructive pulmonary disease) (HCC)    mild  . Depression    "nervous break down age 89"  . Fast heart beat   . GERD (gastroesophageal reflux disease)    takes Nexium daily  . History of colon polyps   . History of depression    received shock treatments   . History of palpitations    takes Metoprolol daily  . History of vertigo   . HOH (hard of hearing)    has hearing aids  . Hyperlipidemia    takes Lovastatin daily  . Hypertension   . Hypothyroid 07/06/2011   takes SYnthroid daily  . Joint pain   . Joint swelling   . Lymphoma ([place])   . Lymphoma ([place])    B cell   . Lymphoma of lymph nodes of head, face, and/or neck ([place]) 07/06/2011  . Neuropathy   . Nocturia   . Osteopenia   . Osteoporosis    hx of and take Reclast  . Peripheral neuropathy   . Personal history of chemotherapy   . Personal history of radiation therapy   . Right knee DJD   . Shortness  of breath dyspnea    with exertion  . Thyroid disease   . Umbilical hernia     Past Surgical History:  Procedure Laterality Date  . ADENOIDECTOMY     as a child  . BREAST LUMPECTOMY Right 1994  . BREAST SURGERY Right   . BUNIONECTOMY Right   . CARDIAC CATHETERIZATION  2014  . COLONOSCOPY    . FOOT SURGERY Right   . FRACTURE SURGERY     wrist right , right knee  . INSERTION OF MESH N/A 04/17/2015   Procedure: INSERTION OF MESH;  Surgeon: Ralene Ok, MD;  Location: [place];  Service: General;  Laterality: N/A;  . KNEE SURGERY    . LEFT HEART CATHETERIZATION WITH CORONARY ANGIOGRAM N/A 12/05/2012   Procedure: LEFT HEART CATHETERIZATION WITH CORONARY ANGIOGRAM;  Surgeon: [name] Sine, MD;  Location: [facility] CATH LAB;  Service: Cardiovascular;  Laterality: N/A;  . right  breast surgery     d/t breast cancer  . TONSILLECTOMY     as a child  . TOTAL KNEE ARTHROPLASTY Right 06/19/2013   Procedure: TOTAL KNEE ARTHROPLASTY;  Surgeon: Lorn Junes, MD;  Location: [place];  Service: Orthopedics;  Laterality: Right;  . TRANSURETHRAL RESECTION OF BLADDER TUMOR WITH GYRUS (TURBT-GYRUS)     01-26-18 Dr. Louis Meckel  . TRANSURETHRAL RESECTION OF BLADDER TUMOR WITH MITOMYCIN-C Bilateral 01/26/2018   Procedure: TRANSURETHRAL RESECTION OF BLADDER TUMOR WITH GEMCITABIN BLLATERAL RETROGRADE PYELOGRAM;  Surgeon: Ardis Hughs, MD;  [facility];  Service: Urology;  Laterality: Bilateral;  . TURBINATE REDUCTION    . UMBILICAL HERNIA REPAIR N/A 04/17/2015   Procedure: LAPAROSCOPIC UMBILICAL HERNIA REPAIR WITH MESH;  Surgeon: Ralene Ok, MD;  Location: [place];  Service: General;  Laterality: N/A;  . UPPER GASTROINTESTINAL ENDOSCOPY      There were no vitals filed for this visit.   Subjective Assessment - 04/09/20 1336    Subjective Pt reports she was sore after her last session, had some neck pain up until today, but "for some reason, I don't today".    Pertinent History osteoperosis, HTN,  brest cancer, depression, lymphoma, osteopenia,    Limitations Standing;Walking    How long can you walk comfortably? Has to walk with the device. Can walk around the store if needed    Diagnostic tests 1. No acute abnormality of the thoracic spine.2. T9 vertebra plana with 6 mm retropulsion causing mild spinal canal stenosis.Lumbar MRI: L2-L5 multi level degenration    Patient Stated Goals to have less and falls and less pain in her neck    Currently in Pain? No/denies    Pain Onset More than a month ago                             [facility] Adult PT Treatment/Exercise - 04/09/20 0001      Knee/Hip Exercises: Standing   Hip Flexion Stengthening;Both;1 set;10 reps    Hip Flexion Limitations with rollator and SBA      Knee/Hip Exercises: Seated   Clamshell with TheraBand Red   2x25                   PT Short Term Goals - 02/27/20 1112      PT SHORT TERM GOAL #1   Title Therapy will review FOTO goals    Baseline reviewed with patient    Time 3    Period Weeks    Status Achieved      PT SHORT TERM GOAL #2   Title Therapy will perfrom BERG balance scale and set goal accordingly    Baseline performed BERG    Status Achieved      PT SHORT TERM GOAL #3   Title Patient will require CGA for narrow base balance    Baseline min A required at minimum to protect pt from losing balance    Time 3    Period Weeks    Status On-going    Target Date 03/19/20      PT SHORT TERM GOAL #4   Title Patient will reduce 5x sit to stand by 6 seconds    Baseline Pt requires cues for anterior scooting to edge of chair before standing with pt taking well over 30 seconds to perform 5 STS with B UE support from armrests on chair and CGA (req Min-Mod A to correct balance after 1st stand due to poor foot placement)    Time 3    Period Weeks    Status On-going    Target Date 03/19/20             PT Long Term Goals -  02/27/20 1123      PT LONG TERM GOAL #1   Title  Patient will demonstrate a 35% limitation on FOTO    Time 3    Period Weeks    Status New      PT LONG TERM GOAL #2   Title Patient will demonstrate a <10 second sit to stand test to demonstrate decreased falls risk    Time 6    Period Weeks    Status New      PT LONG TERM GOAL #3   Title Patient will demonstrate a relaistic improvement in BERG testing to show a reduced falls risk ( level to be determined next visit)    Time 6    Period Weeks    Status New                 Plan - 04/09/20 1342    Clinical Impression Statement Pt continues to require SBA for sit <> stand and stabilizing table to prevent fall due to significant UE use to transfer. Session limited secondary to pt feeling more fatigued today and reportedly a little off balance.    Personal Factors and Comorbidities Comorbidity 1;Comorbidity 2;Comorbidity 3+    Comorbidities osteoperosis, anxiety, thoracic compression fracture    Examination-Activity Limitations Bend;Caring for Others;Carry;Dressing;Lift;Stand;Stairs;Squat;Reach Overhead;Locomotion Level    Examination-Participation Restrictions Cleaning;Meal Prep;Community Activity;Laundry;Shop    Stability/Clinical Decision Making Evolving/Moderate complexity    Rehab Potential Good    PT Frequency 2x / week    PT Duration 6 weeks    PT Treatment/Interventions ADLs/Self Care Home Management;Cryotherapy;Moist Heat;DME Instruction;Gait training;Stair training;Functional mobility training;Therapeutic activities;Therapeutic exercise;Neuromuscular re-education;Patient/family education;Manual techniques;Passive range of motion;Dry needling;Taping    PT Next Visit Plan continue to work on balance and strengthening exercises. New script recieved for cervical spine. Proceed with eval of the neck but be aware of thoracic compression fx. Neurosurgeon scheduled end of Oct    PT Home Exercise Plan Access Code [email or web address]: supine SLR, PPT + march, standing heel raises, standing toe  raises at counter with support. Sitting exs for LAQ, marching, hip add sets, and hip abd c Tband.    Consulted and Agree with Plan of Care Patient           Patient will benefit from skilled therapeutic intervention in order to improve the following deficits and impairments:  Abnormal gait, Decreased range of motion, Increased fascial restricitons, Pain, Postural dysfunction, Decreased activity tolerance, Improper body mechanics, Impaired perceived functional ability, Difficulty walking, Decreased endurance, Decreased strength, Decreased safety awareness  Visit Diagnosis: Frequent falls  Abnormal posture  Cervicalgia     Problem List Patient Active Problem List   Diagnosis Date Noted  . Recurrent falls 11/16/2019  . Hypotension 11/15/2018  . Left shoulder pain 02/08/2018  . History of bladder cancer 02/08/2018  . Anemia, chronic disease 07/27/2017  . Weight loss 11/19/2015  . Chronic constipation 11/19/2015  . Other specified hypothyroidism 11/14/2015  . DJD (degenerative joint disease) of knee 06/19/2013  . Right knee DJD   . Hyperlipidemia   . Thyroid disease   . Chest pain 12/26/2012  . Unstable angina ([place]) 12/03/2012  . Osteoporosis 12/03/2012  . Palpitations 12/03/2012  . Dyslipidemia 12/03/2012  . History of breast cancer in female 07/06/2011  . History of non-Hodgkin's lymphoma 07/06/2011  . Hypothyroid 07/06/2011  . Chronic ITP (idiopathic thrombocytopenia) (HCC) 07/06/2011    Izell Bristol, PT, DPT 04/09/2020, 2:16 PM  [place],  [place], [address] Phone: [phone]   Fax:  [phone]  Name: Darlene [name] MRN: [id number] Date of Birth: 1934-09-09

## 2020-04-12 ENCOUNTER — Other Ambulatory Visit: Payer: Self-pay

## 2020-04-12 ENCOUNTER — Ambulatory Visit: Payer: Medicare Other

## 2020-04-12 DIAGNOSIS — M542 Cervicalgia: Secondary | ICD-10-CM

## 2020-04-12 DIAGNOSIS — R293 Abnormal posture: Secondary | ICD-10-CM | POA: Diagnosis not present

## 2020-04-12 DIAGNOSIS — R296 Repeated falls: Secondary | ICD-10-CM

## 2020-04-12 NOTE — Therapy (Signed)
[place], [place], [address] Phone: [phone]   Fax:  [phone]  Physical Therapy Treatment  Patient Details  Name: Darlene [name] MRN: [id number] Date of Birth: 04/12/1935 Referring Provider (PT): Dr Narda Amber    Encounter Date: 04/12/2020   PT End of Session - 04/12/20 1028    Visit Number 18    Number of Visits 24    Date for PT Re-Evaluation 04/23/20    Authorization Type Medicare    PT Start Time 1017    PT Stop Time 1100    PT Time Calculation (min) 43 min    Equipment Utilized During Treatment Other (comment);Gait belt    Activity Tolerance Patient tolerated treatment well    Behavior During Therapy [facility] for tasks assessed/performed           Past Medical History:  Diagnosis Date  . Anxiety   . Breast cancer, stage 1, estrogen receptor negative ([place]) 07/06/2011  . Cancer of breast ([place])   . Cataract   . Chronic ITP (idiopathic thrombocytopenia) (HCC) 07/06/2011  . Constipation    takes Colace daily  . COPD (chronic obstructive pulmonary disease) (HCC)    mild  . Depression    "nervous break down age 33"  . Fast heart beat   . GERD (gastroesophageal reflux disease)    takes Nexium daily  . History of colon polyps   . History of depression    received shock treatments   . History of palpitations    takes Metoprolol daily  . History of vertigo   . HOH (hard of hearing)    has hearing aids  . Hyperlipidemia    takes Lovastatin daily  . Hypertension   . Hypothyroid 07/06/2011   takes SYnthroid daily  . Joint pain   . Joint swelling   . Lymphoma ([place])   . Lymphoma ([place])    B cell   . Lymphoma of lymph nodes of head, face, and/or neck ([place]) 07/06/2011  . Neuropathy   . Nocturia   . Osteopenia   . Osteoporosis    hx of and take Reclast  . Peripheral neuropathy   . Personal history of chemotherapy   . Personal history of radiation therapy   . Right knee DJD   . Shortness  of breath dyspnea    with exertion  . Thyroid disease   . Umbilical hernia     Past Surgical History:  Procedure Laterality Date  . ADENOIDECTOMY     as a child  . BREAST LUMPECTOMY Right 1994  . BREAST SURGERY Right   . BUNIONECTOMY Right   . CARDIAC CATHETERIZATION  2014  . COLONOSCOPY    . FOOT SURGERY Right   . FRACTURE SURGERY     wrist right , right knee  . INSERTION OF MESH N/A 04/17/2015   Procedure: INSERTION OF MESH;  Surgeon: Ralene Ok, MD;  Location: [place];  Service: General;  Laterality: N/A;  . KNEE SURGERY    . LEFT HEART CATHETERIZATION WITH CORONARY ANGIOGRAM N/A 12/05/2012   Procedure: LEFT HEART CATHETERIZATION WITH CORONARY ANGIOGRAM;  Surgeon: [name] Sine, MD;  Location[facility] CATH LAB;  Service: Cardiovascular;  Laterality: N/A;  . right  breast surgery     d/t breast cancer  . TONSILLECTOMY     as a child  . TOTAL KNEE ARTHROPLASTY Right 06/19/2013   Procedure: TOTAL KNEE ARTHROPLASTY;  Surgeon: Lorn Junes, MD;  Location: [place];  Service: Orthopedics;  Laterality: Right;  . TRANSURETHRAL RESECTION OF BLADDER TUMOR WITH GYRUS (TURBT-GYRUS)     01-26-18 Dr. Louis Meckel  . TRANSURETHRAL RESECTION OF BLADDER TUMOR WITH MITOMYCIN-C Bilateral 01/26/2018   Procedure: TRANSURETHRAL RESECTION OF BLADDER TUMOR WITH GEMCITABIN BLLATERAL RETROGRADE PYELOGRAM;  Surgeon: Ardis Hughs, MD;  [facility];  Service: Urology;  Laterality: Bilateral;  . TURBINATE REDUCTION    . UMBILICAL HERNIA REPAIR N/A 04/17/2015   Procedure: LAPAROSCOPIC UMBILICAL HERNIA REPAIR WITH MESH;  Surgeon: Ralene Ok, MD;  Location: [place];  Service: General;  Laterality: N/A;  . UPPER GASTROINTESTINAL ENDOSCOPY      There were no vitals filed for this visit.   Subjective Assessment - 04/12/20 1030    Subjective Pt reports her neck hurt her last night, but it's not bad this morning. She continues to have no falls.    Pertinent History osteoperosis, HTN, brest cancer,  depression, lymphoma, osteopenia,    Limitations Standing;Walking    How long can you walk comfortably? Has to walk with the device. Can walk around the store if needed    Diagnostic tests 1. No acute abnormality of the thoracic spine.2. T9 vertebra plana with 6 mm retropulsion causing mild spinal canal stenosis.Lumbar MRI: L2-L5 multi level degenration    Patient Stated Goals to have less and falls and less pain in her neck    Currently in Pain? No/denies    Pain Onset More than a month ago                             [facility] Adult PT Treatment/Exercise - 04/12/20 0001      Self-Care   Other Self-Care Comments  foot/head positioning and posture for sit to stands      Neuro Re-ed    Neuro Re-ed Details  Airex pad at sink: 3 rounds of standing balance with SBA-MIN A for correcting near LOB a few times posterior; 1st round: FTEO, 2nd round: feet hip width looking up and down; 3rd round: nudging   VCs and TCs for L glute activation and upright posture     Knee/Hip Exercises: Seated   Sit to Sand 5 reps;with UE support   3 sets multiple levels high/low table                 PT Education - 04/12/20 1256    Education Details Educated not to stand up with feet together from sitting position or to look at ceiling upon initial stand to avoid falls    Person(s) Educated Patient    Methods Explanation;Demonstration    Comprehension Verbalized understanding;Returned demonstration            PT Short Term Goals - 02/27/20 1112      PT SHORT TERM GOAL #1   Title Therapy will review FOTO goals    Baseline reviewed with patient    Time 3    Period Weeks    Status Achieved      PT SHORT TERM GOAL #2   Title Therapy will perfrom BERG balance scale and set goal accordingly    Baseline performed BERG    Status Achieved      PT SHORT TERM GOAL #3   Title Patient will require CGA for narrow base balance    Baseline min A required at minimum to protect pt from losing  balance    Time 3    Period Weeks  Status On-going    Target Date 03/19/20      PT SHORT TERM GOAL #4   Title Patient will reduce 5x sit to stand by 6 seconds    Baseline Pt requires cues for anterior scooting to edge of chair before standing with pt taking well over 30 seconds to perform 5 STS with B UE support from armrests on chair and CGA (req Min-Mod A to correct balance after 1st stand due to poor foot placement)    Time 3    Period Weeks    Status On-going    Target Date 03/19/20             PT Long Term Goals - 02/27/20 1123      PT LONG TERM GOAL #1   Title Patient will demonstrate a 35% limitation on FOTO    Time 3    Period Weeks    Status New      PT LONG TERM GOAL #2   Title Patient will demonstrate a <10 second sit to stand test to demonstrate decreased falls risk    Time 6    Period Weeks    Status New      PT LONG TERM GOAL #3   Title Patient will demonstrate a relaistic improvement in BERG testing to show a reduced falls risk ( level to be determined next visit)    Time 6    Period Weeks    Status New                 Plan - 04/12/20 1030    Clinical Impression Statement Pt did well with multi level sit to stands, significantly improving today from last visit with less verbal cues needed ro recall appropriate technique and placement. Pt did fairly well with neuro re-ed on airex with intermittent correctives provided to prevent posterior LOB, but pt mostly able to stand with no UE support and close SBA.    Personal Factors and Comorbidities Comorbidity 1;Comorbidity 2;Comorbidity 3+    Comorbidities osteoperosis, anxiety, thoracic compression fracture    Examination-Activity Limitations Bend;Caring for Others;Carry;Dressing;Lift;Stand;Stairs;Squat;Reach Overhead;Locomotion Level    Examination-Participation Restrictions Cleaning;Meal Prep;Community Activity;Laundry;Shop    Stability/Clinical Decision Making Evolving/Moderate complexity    Rehab  Potential Good    PT Frequency 2x / week    PT Duration 6 weeks    PT Treatment/Interventions ADLs/Self Care Home Management;Cryotherapy;Moist Heat;DME Instruction;Gait training;Stair training;Functional mobility training;Therapeutic activities;Therapeutic exercise;Neuromuscular re-education;Patient/family education;Manual techniques;Passive range of motion;Dry needling;Taping    PT Next Visit Plan continue to work on balance and strengthening exercises. New script recieved for cervical spine. Proceed with eval of the neck but be aware of thoracic compression fx. Neurosurgeon scheduled end of Oct    PT Home Exercise Plan Access Code [email or web address]: supine SLR, PPT + march, standing heel raises, standing toe raises at counter with support. Sitting exs for LAQ, marching, hip add sets, and hip abd c Tband.    Consulted and Agree with Plan of Care Patient           Patient will benefit from skilled therapeutic intervention in order to improve the following deficits and impairments:  Abnormal gait,Decreased range of motion,Increased fascial restricitons,Pain,Postural dysfunction,Decreased activity tolerance,Improper body mechanics,Impaired perceived functional ability,Difficulty walking,Decreased endurance,Decreased strength,Decreased safety awareness  Visit Diagnosis: Frequent falls  Abnormal posture  Cervicalgia     Problem List Patient Active Problem List   Diagnosis Date Noted  . Recurrent falls 11/16/2019  . Hypotension 11/15/2018  . Left shoulder pain 02/08/2018  .  History of bladder cancer 02/08/2018  . Anemia, chronic disease 07/27/2017  . Weight loss 11/19/2015  . Chronic constipation 11/19/2015  . Other specified hypothyroidism 11/14/2015  . DJD (degenerative joint disease) of knee 06/19/2013  . Right knee DJD   . Hyperlipidemia   . Thyroid disease   . Chest pain 12/26/2012  . Unstable angina ([place]) 12/03/2012  . Osteoporosis 12/03/2012  . Palpitations 12/03/2012  .  Dyslipidemia 12/03/2012  . History of breast cancer in female 07/06/2011  . History of non-Hodgkin's lymphoma 07/06/2011  . Hypothyroid 07/06/2011  . Chronic ITP (idiopathic thrombocytopenia) (HCC) 07/06/2011    Izell Leonidas, PT, DPT 04/12/2020, 1:01 PM  [facility] [address] [place], [place], [address] Phone: [phone]   Fax:  [phone]  Name: Darlene [name] MRN: [id number] Date of Birth: [birth year]

## 2020-04-16 ENCOUNTER — Other Ambulatory Visit: Payer: Self-pay

## 2020-04-16 ENCOUNTER — Ambulatory Visit: Payer: Medicare Other

## 2020-04-16 DIAGNOSIS — M542 Cervicalgia: Secondary | ICD-10-CM

## 2020-04-16 DIAGNOSIS — R293 Abnormal posture: Secondary | ICD-10-CM | POA: Diagnosis not present

## 2020-04-16 DIAGNOSIS — R296 Repeated falls: Secondary | ICD-10-CM

## 2020-04-16 NOTE — Therapy (Signed)
[place], [place], [address] Phone: [phone]   Fax:  [phone]  Physical Therapy Treatment  Patient Details  Name: Darlene [name] MRN: [id number] Date of Birth: 1935-04-17 Referring Provider (PT): Dr Narda Amber    Encounter Date: 04/16/2020   PT End of Session - 04/16/20 1337    Visit Number 19    Number of Visits 24    Date for PT Re-Evaluation 04/23/20    Authorization Type Medicare    PT Start Time 1330    PT Stop Time 1412    PT Time Calculation (min) 42 min    Equipment Utilized During Treatment Other (comment);Gait belt    Activity Tolerance Patient tolerated treatment well    Behavior During Therapy [facility] for tasks assessed/performed           Past Medical History:  Diagnosis Date  . Anxiety   . Breast cancer, stage 1, estrogen receptor negative ([place]) 07/06/2011  . Cancer of breast ([place])   . Cataract   . Chronic ITP (idiopathic thrombocytopenia) (HCC) 07/06/2011  . Constipation    takes Colace daily  . COPD (chronic obstructive pulmonary disease) (HCC)    mild  . Depression    "nervous break down age 5"  . Fast heart beat   . GERD (gastroesophageal reflux disease)    takes Nexium daily  . History of colon polyps   . History of depression    received shock treatments   . History of palpitations    takes Metoprolol daily  . History of vertigo   . HOH (hard of hearing)    has hearing aids  . Hyperlipidemia    takes Lovastatin daily  . Hypertension   . Hypothyroid 07/06/2011   takes SYnthroid daily  . Joint pain   . Joint swelling   . Lymphoma ([place])   . Lymphoma ([place])    B cell   . Lymphoma of lymph nodes of head, face, and/or neck ([place]) 07/06/2011  . Neuropathy   . Nocturia   . Osteopenia   . Osteoporosis    hx of and take Reclast  . Peripheral neuropathy   . Personal history of chemotherapy   . Personal history of radiation therapy   . Right knee DJD   . Shortness  of breath dyspnea    with exertion  . Thyroid disease   . Umbilical hernia     Past Surgical History:  Procedure Laterality Date  . ADENOIDECTOMY     as a child  . BREAST LUMPECTOMY Right 1994  . BREAST SURGERY Right   . BUNIONECTOMY Right   . CARDIAC CATHETERIZATION  2014  . COLONOSCOPY    . FOOT SURGERY Right   . FRACTURE SURGERY     wrist right , right knee  . INSERTION OF MESH N/A 04/17/2015   Procedure: INSERTION OF MESH;  Surgeon: Ralene Ok, MD;  Location: [place];  Service: General;  Laterality: N/A;  . KNEE SURGERY    . LEFT HEART CATHETERIZATION WITH CORONARY ANGIOGRAM N/A 12/05/2012   Procedure: LEFT HEART CATHETERIZATION WITH CORONARY ANGIOGRAM;  Surgeon: [name] Sine, MD;  Location: [facility] CATH LAB;  Service: Cardiovascular;  Laterality: N/A;  . right  breast surgery     d/t breast cancer  . TONSILLECTOMY     as a child  . TOTAL KNEE ARTHROPLASTY Right 06/19/2013   Procedure: TOTAL KNEE ARTHROPLASTY;  Surgeon: Lorn Junes, MD;  Location: [employer];  Service: Orthopedics;  Laterality: Right;  . TRANSURETHRAL RESECTION OF BLADDER TUMOR WITH GYRUS (TURBT-GYRUS)     01-26-18 Dr. Louis Meckel  . TRANSURETHRAL RESECTION OF BLADDER TUMOR WITH MITOMYCIN-C Bilateral 01/26/2018   Procedure: TRANSURETHRAL RESECTION OF BLADDER TUMOR WITH GEMCITABIN BLLATERAL RETROGRADE PYELOGRAM;  Surgeon: Ardis Hughs, MD;  Location: [facility];  Service: Urology;  Laterality: Bilateral;  . TURBINATE REDUCTION    . UMBILICAL HERNIA REPAIR N/A 04/17/2015   Procedure: LAPAROSCOPIC UMBILICAL HERNIA REPAIR WITH MESH;  Surgeon: Ralene Ok, MD;  Location: [place];  Service: General;  Laterality: N/A;  . UPPER GASTROINTESTINAL ENDOSCOPY      There were no vitals filed for this visit.                      [facility] Adult PT Treatment/Exercise - 04/16/20 0001      Transfers   Five time sit to stand comments  29s with BUE assist      Neuro Re-ed    Neuro Re-ed Details  Airex pad at  sink: 3 rounds of standing balance with SBA-MIN A for correcting near LOB a few times posterior; 1st round: FTEO, 2nd round: feet hip width looking up and down; 3rd round: nudging   VCs and TCs for L glute activation and upright posture; rest break taken to address fatigue     Knee/Hip Exercises: Seated   Sit to Sand 5 reps;with UE support      Knee/Hip Exercises: Supine   Other Supine Knee/Hip Exercises marching, single leg march to extension and back    Other Supine Knee/Hip Exercises single leg lowers x 10 ea      Shoulder Exercises: Supine   Other Supine Exercises shoulder abduction and ER                    PT Short Term Goals - 02/27/20 1112      PT SHORT TERM GOAL #1   Title Therapy will review FOTO goals    Baseline reviewed with patient    Time 3    Period Weeks    Status Achieved      PT SHORT TERM GOAL #2   Title Therapy will perfrom BERG balance scale and set goal accordingly    Baseline performed BERG    Status Achieved      PT SHORT TERM GOAL #3   Title Patient will require CGA for narrow base balance    Baseline min A required at minimum to protect pt from losing balance    Time 3    Period Weeks    Status On-going    Target Date 03/19/20      PT SHORT TERM GOAL #4   Title Patient will reduce 5x sit to stand by 6 seconds    Baseline Pt requires cues for anterior scooting to edge of chair before standing with pt taking well over 30 seconds to perform 5 STS with B UE support from armrests on chair and CGA (req Min-Mod A to correct balance after 1st stand due to poor foot placement)    Time 3    Period Weeks    Status On-going    Target Date 03/19/20             PT Long Term Goals - 02/27/20 1123      PT LONG TERM GOAL #1   Title Patient will demonstrate a 35% limitation on FOTO    Time 3  Period Weeks    Status New      PT LONG TERM GOAL #2   Title Patient will demonstrate a <10 second sit to stand test to demonstrate decreased falls  risk    Time 6    Period Weeks    Status New      PT LONG TERM GOAL #3   Title Patient will demonstrate a relaistic improvement in BERG testing to show a reduced falls risk ( level to be determined next visit)    Time 6    Period Weeks    Status New                 Plan - 04/16/20 1337    Clinical Impression Statement Pt reported no neck pain at initiation of session, but started to c/o pain during standing nuero re-ed on airex. Discontinued after a couple of rounds and performed rest of exercises in sitting and supine with no c/o neck pain. Progressed LE exercises in supine to engage core with pt reporting fatigue, but able to perform.    Personal Factors and Comorbidities Comorbidity 1;Comorbidity 2;Comorbidity 3+    Comorbidities osteoperosis, anxiety, thoracic compression fracture    Examination-Activity Limitations Bend;Caring for Others;Carry;Dressing;Lift;Stand;Stairs;Squat;Reach Overhead;Locomotion Level    Examination-Participation Restrictions Cleaning;Meal Prep;Community Activity;Laundry;Shop    Stability/Clinical Decision Making Evolving/Moderate complexity    Rehab Potential Good    PT Frequency 2x / week    PT Duration 6 weeks    PT Treatment/Interventions ADLs/Self Care Home Management;Cryotherapy;Moist Heat;DME Instruction;Gait training;Stair training;Functional mobility training;Therapeutic activities;Therapeutic exercise;Neuromuscular re-education;Patient/family education;Manual techniques;Passive range of motion;Dry needling;Taping    PT Next Visit Plan continue to work on balance and strengthening exercises. New script recieved for cervical spine. Proceed with eval of the neck but be aware of thoracic compression fx. Neurosurgeon scheduled end of Oct    PT Home Exercise Plan Access Code [email or web address]: supine SLR, PPT + march, standing heel raises, standing toe raises at counter with support. Sitting exs for LAQ, marching, hip add sets, and hip abd c Tband.    Consulted  and Agree with Plan of Care Patient           Patient will benefit from skilled therapeutic intervention in order to improve the following deficits and impairments:  Abnormal gait,Decreased range of motion,Increased fascial restricitons,Pain,Postural dysfunction,Decreased activity tolerance,Improper body mechanics,Impaired perceived functional ability,Difficulty walking,Decreased endurance,Decreased strength,Decreased safety awareness  Visit Diagnosis: Frequent falls  Abnormal posture  Cervicalgia     Problem List Patient Active Problem List   Diagnosis Date Noted  . Recurrent falls 11/16/2019  . Hypotension 11/15/2018  . Left shoulder pain 02/08/2018  . History of bladder cancer 02/08/2018  . Anemia, chronic disease 07/27/2017  . Weight loss 11/19/2015  . Chronic constipation 11/19/2015  . Other specified hypothyroidism 11/14/2015  . DJD (degenerative joint disease) of knee 06/19/2013  . Right knee DJD   . Hyperlipidemia   . Thyroid disease   . Chest pain 12/26/2012  . Unstable angina ([place]) 12/03/2012  . Osteoporosis 12/03/2012  . Palpitations 12/03/2012  . Dyslipidemia 12/03/2012  . History of breast cancer in female 07/06/2011  . History of non-Hodgkin's lymphoma 07/06/2011  . Hypothyroid 07/06/2011  . Chronic ITP (idiopathic thrombocytopenia) (HCC) 07/06/2011    Izell Hayden, PT, DPT 04/16/2020, 2:15 PM  [facility] [address] [place], [place], [address] Phone: [phone]   Fax:  [phone]  Name: Darlene [name] MRN: [id number] Date of Birth: 25-Jun-1934

## 2020-04-19 ENCOUNTER — Ambulatory Visit: Payer: Medicare Other

## 2020-04-19 ENCOUNTER — Other Ambulatory Visit: Payer: Self-pay

## 2020-04-19 DIAGNOSIS — R293 Abnormal posture: Secondary | ICD-10-CM

## 2020-04-19 DIAGNOSIS — M542 Cervicalgia: Secondary | ICD-10-CM

## 2020-04-19 DIAGNOSIS — R296 Repeated falls: Secondary | ICD-10-CM | POA: Diagnosis not present

## 2020-04-19 NOTE — Therapy (Addendum)
[place], [place], [address] Phone: [phone]   Fax:  [phone]  Physical Therapy Progress Note Progress Note Reporting Period 03/04/20 to 04/19/20  See note below for Objective Data and Assessment of Progress/Goals.    Patient Details  Name: Darlene [name] MRN: [id number] Date of Birth: 10/07/1934 Referring Provider (PT): Dr Narda Amber    Encounter Date: 04/19/2020   PT End of Session - 04/19/20 1323    Visit Number 20    Number of Visits 24    Date for PT Re-Evaluation 04/23/20    Authorization Type Medicare    PT Start Time 3016    PT Stop Time 1400    PT Time Calculation (min) 43 min    Equipment Utilized During Treatment Other (comment);Gait belt    Activity Tolerance Patient tolerated treatment well    Behavior During Therapy WFL for tasks assessed/performed           Past Medical History:  Diagnosis Date  . Anxiety   . Breast cancer, stage 1, estrogen receptor negative ([place]) 07/06/2011  . Cancer of breast ([place])   . Cataract   . Chronic ITP (idiopathic thrombocytopenia) (HCC) 07/06/2011  . Constipation    takes Colace daily  . COPD (chronic obstructive pulmonary disease) (HCC)    mild  . Depression    "nervous break down age 35"  . Fast heart beat   . GERD (gastroesophageal reflux disease)    takes Nexium daily  . History of colon polyps   . History of depression    received shock treatments   . History of palpitations    takes Metoprolol daily  . History of vertigo   . HOH (hard of hearing)    has hearing aids  . Hyperlipidemia    takes Lovastatin daily  . Hypertension   . Hypothyroid 07/06/2011   takes SYnthroid daily  . Joint pain   . Joint swelling   . Lymphoma ([place])   . Lymphoma ([place])    B cell   . Lymphoma of lymph nodes of head, face, and/or neck ([place]) 07/06/2011  . Neuropathy   . Nocturia   . Osteopenia   . Osteoporosis    hx of and take Reclast  .  Peripheral neuropathy   . Personal history of chemotherapy   . Personal history of radiation therapy   . Right knee DJD   . Shortness of breath dyspnea    with exertion  . Thyroid disease   . Umbilical hernia     Past Surgical History:  Procedure Laterality Date  . ADENOIDECTOMY     as a child  . BREAST LUMPECTOMY Right 1994  . BREAST SURGERY Right   . BUNIONECTOMY Right   . CARDIAC CATHETERIZATION  2014  . COLONOSCOPY    . FOOT SURGERY Right   . FRACTURE SURGERY     wrist right , right knee  . INSERTION OF MESH N/A 04/17/2015   Procedure: INSERTION OF MESH;  Surgeon: Ralene Ok, MD;  Location: [place];  Service: General;  Laterality: N/A;  . KNEE SURGERY    . LEFT HEART CATHETERIZATION WITH CORONARY ANGIOGRAM N/A 12/05/2012   Procedure: LEFT HEART CATHETERIZATION WITH CORONARY ANGIOGRAM;  Surgeon: [name] Sine, MD;  Location: [facility] CATH LAB;  Service: Cardiovascular;  Laterality: N/A;  . right  breast surgery     d/t breast cancer  . TONSILLECTOMY     as a child  .  TOTAL KNEE ARTHROPLASTY Right 06/19/2013   Procedure: TOTAL KNEE ARTHROPLASTY;  Surgeon: Lorn Junes, MD;  Location: [place];  Service: Orthopedics;  Laterality: Right;  . TRANSURETHRAL RESECTION OF BLADDER TUMOR WITH GYRUS (TURBT-GYRUS)     01-26-18 Dr. Louis Meckel  . TRANSURETHRAL RESECTION OF BLADDER TUMOR WITH MITOMYCIN-C Bilateral 01/26/2018   Procedure: TRANSURETHRAL RESECTION OF BLADDER TUMOR WITH GEMCITABIN BLLATERAL RETROGRADE PYELOGRAM;  Surgeon: Ardis Hughs, MD;  [facility];  Service: Urology;  Laterality: Bilateral;  . TURBINATE REDUCTION    . UMBILICAL HERNIA REPAIR N/A 04/17/2015   Procedure: LAPAROSCOPIC UMBILICAL HERNIA REPAIR WITH MESH;  Surgeon: Ralene Ok, MD;  Location: [place];  Service: General;  Laterality: N/A;  . UPPER GASTROINTESTINAL ENDOSCOPY      There were no vitals filed for this visit.   Subjective Assessment - 04/19/20 1322    Subjective Pt reports her neck  continues to get better. 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    Pertinent History osteoperosis, HTN, brest cancer, depression, lymphoma, osteopenia,    Limitations Standing;Walking    How long can you walk comfortably? Has to walk with the device. Can walk around the store if needed    Diagnostic tests 1. No acute abnormality of the thoracic spine.2. T9 vertebra plana with 6 mm retropulsion causing mild spinal canal stenosis.Lumbar MRI: L2-L5 multi level degenration    Patient Stated Goals to have less and falls and less pain in her neck    Currently in Pain? No/denies    Pain Onset More than a month ago                             [facility] Adult PT Treatment/Exercise - 04/19/20 0001      Knee/Hip Exercises: Seated   Sit to Sand 3 sets;5 reps;with UE support   varying table height     Knee/Hip Exercises: Supine   Bridges 2 sets;10 reps    Bridges Limitations HS cramping R LE    Other Supine Knee/Hip Exercises marching, single leg march to extension and back    Other Supine Knee/Hip Exercises single leg lowers x 10 ea                  PT Education - 04/19/20 1356    Education Details added bridging to home program, educating to not lift too high if pt is causing cramps    Person(s) Educated Patient    Methods Explanation;Demonstration;Tactile cues;Handout;Verbal cues    Comprehension Returned demonstration;Verbalized understanding;Tactile cues required;Verbal cues required            PT Short Term Goals - 02/27/20 1112      PT SHORT TERM GOAL #1   Title Therapy will review FOTO goals    Baseline reviewed with patient    Time 3    Period Weeks    Status Achieved      PT SHORT TERM GOAL #2   Title Therapy will perfrom BERG balance scale and set goal accordingly    Baseline performed BERG    Status Achieved      PT SHORT TERM GOAL #3   Title Patient will require CGA for narrow base balance    Baseline min A  required at minimum to protect pt from losing balance    Time 3    Period Weeks    Status On-going    Target  Date 03/19/20      PT SHORT TERM GOAL #4   Title Patient will reduce 5x sit to stand by 6 seconds    Baseline Pt requires cues for anterior scooting to edge of chair before standing with pt taking well over 30 seconds to perform 5 STS with B UE support from armrests on chair and CGA (req Min-Mod A to correct balance after 1st stand due to poor foot placement)    Time 3    Period Weeks    Status On-going    Target Date 03/19/20             PT Long Term Goals - 02/27/20 1123      PT LONG TERM GOAL #1   Title Patient will demonstrate a 35% limitation on FOTO    Time 3    Period Weeks    Status Ongoing (FOTO has remained consistent)     PT LONG TERM GOAL #2   Title Patient will demonstrate a <10 second sit to stand test to demonstrate decreased falls risk    Time 6    Period Weeks    Status Ongoing (has remained consistent)     PT LONG TERM GOAL #3   Title Patient will demonstrate a realistic improvement in BERG testing to show a reduced falls risk ( level to be determined next visit)    Time 6    Period Weeks    Status Ongoing (to be reviewed)                Plan - 04/19/20 1323    Clinical Impression Statement Pt toleated tx well with addition of lower extremity bridging, reporting cramping on two occasions in right hamstrings that dissipated with right foot further forward. Pt is becoming faster at sit to stands from various table heights, and reporting less fatigue. Prepping for DC at end of month.    Personal Factors and Comorbidities Comorbidity 1;Comorbidity 2;Comorbidity 3+    Comorbidities osteoperosis, anxiety, thoracic compression fracture    Examination-Activity Limitations Bend;Caring for Others;Carry;Dressing;Lift;Stand;Stairs;Squat;Reach Overhead;Locomotion Level    Examination-Participation Restrictions Cleaning;Meal Prep;Community  Activity;Laundry;Shop    Stability/Clinical Decision Making Evolving/Moderate complexity    Rehab Potential Good    PT Frequency 2x / week    PT Duration 6 weeks    PT Treatment/Interventions ADLs/Self Care Home Management;Cryotherapy;Moist Heat;DME Instruction;Gait training;Stair training;Functional mobility training;Therapeutic activities;Therapeutic exercise;Neuromuscular re-education;Patient/family education;Manual techniques;Passive range of motion;Dry needling;Taping    PT Next Visit Plan continue to work on balance and strengthening exercises. New script recieved for cervical spine. Proceed with eval of the neck but be aware of thoracic compression fx. Neurosurgeon scheduled end of Oct    PT Home Exercise Plan Access Code [email or web address]: supine SLR, PPT + march, standing heel raises, standing toe raises at counter with support. Sitting exs for LAQ, marching, hip add sets, and hip abd c Tband.    Consulted and Agree with Plan of Care Patient           Patient will benefit from skilled therapeutic intervention in order to improve the following deficits and impairments:  Abnormal gait,Decreased range of motion,Increased fascial restricitons,Pain,Postural dysfunction,Decreased activity tolerance,Improper body mechanics,Impaired perceived functional ability,Difficulty walking,Decreased endurance,Decreased strength,Decreased safety awareness  Visit Diagnosis: Frequent falls  Abnormal posture  Cervicalgia     Problem List Patient Active Problem List   Diagnosis Date Noted  . Recurrent falls 11/16/2019  . Hypotension 11/15/2018  . Left shoulder pain 02/08/2018  . History of bladder cancer 02/08/2018  .  Anemia, chronic disease 07/27/2017  . Weight loss 11/19/2015  . Chronic constipation 11/19/2015  . Other specified hypothyroidism 11/14/2015  . DJD (degenerative joint disease) of knee 06/19/2013  . Right knee DJD   . Hyperlipidemia   . Thyroid disease   . Chest pain 12/26/2012  .  Unstable angina ([place]) 12/03/2012  . Osteoporosis 12/03/2012  . Palpitations 12/03/2012  . Dyslipidemia 12/03/2012  . History of breast cancer in female 07/06/2011  . History of non-Hodgkin's lymphoma 07/06/2011  . Hypothyroid 07/06/2011  . Chronic ITP (idiopathic thrombocytopenia) (HCC) 07/06/2011    Izell Excelsior, PT, DPT 04/19/2020, 2:02 PM  [facility] [address] [place], [place], [address] Phone: [phone]   Fax:  [phone]  Name: Darlene [name] MRN: [id number] Date of Birth: 1935/02/13

## 2020-04-22 ENCOUNTER — Other Ambulatory Visit: Payer: Self-pay

## 2020-04-22 ENCOUNTER — Ambulatory Visit: Payer: Medicare Other | Admitting: Physical Therapy

## 2020-04-22 ENCOUNTER — Encounter: Payer: Self-pay | Admitting: Physical Therapy

## 2020-04-22 DIAGNOSIS — R293 Abnormal posture: Secondary | ICD-10-CM | POA: Diagnosis not present

## 2020-04-22 DIAGNOSIS — R296 Repeated falls: Secondary | ICD-10-CM

## 2020-04-22 DIAGNOSIS — M542 Cervicalgia: Secondary | ICD-10-CM | POA: Diagnosis not present

## 2020-04-22 NOTE — Therapy (Signed)
[place], [place], [address] Phone: [phone]   Fax:  [phone]  Physical Therapy Treatment  Patient Details  Name: Darlene [name] MRN: [id number] Date of Birth: 07/29/1934 Referring Provider (PT): Dr Narda Amber    Encounter Date: 04/22/2020    PT End of Session - 04/22/20 1423    Visit Number 21    Number of Visits 24    Date for PT Re-Evaluation 05/20/20    Authorization Type Medicare progress note done on 21    PT Start Time 1415    PT Stop Time 1458    PT Time Calculation (min) 43 min    Activity Tolerance Patient tolerated treatment well    Behavior During Therapy [facility] for tasks assessed/performed           Past Medical History:  Diagnosis Date  . Anxiety   . Breast cancer, stage 1, estrogen receptor negative ([place]) 07/06/2011  . Cancer of breast ([place])   . Cataract   . Chronic ITP (idiopathic thrombocytopenia) (HCC) 07/06/2011  . Constipation    takes Colace daily  . COPD (chronic obstructive pulmonary disease) (HCC)    mild  . Depression    "nervous break down age 28"  . Fast heart beat   . GERD (gastroesophageal reflux disease)    takes Nexium daily  . History of colon polyps   . History of depression    received shock treatments   . History of palpitations    takes Metoprolol daily  . History of vertigo   . HOH (hard of hearing)    has hearing aids  . Hyperlipidemia    takes Lovastatin daily  . Hypertension   . Hypothyroid 07/06/2011   takes SYnthroid daily  . Joint pain   . Joint swelling   . Lymphoma ([place])   . Lymphoma ([place])    B cell   . Lymphoma of lymph nodes of head, face, and/or neck ([place]) 07/06/2011  . Neuropathy   . Nocturia   . Osteopenia   . Osteoporosis    hx of and take Reclast  . Peripheral neuropathy   . Personal history of chemotherapy   . Personal history of radiation therapy   . Right knee DJD   . Shortness of breath dyspnea    with exertion  .  Thyroid disease   . Umbilical hernia     Past Surgical History:  Procedure Laterality Date  . ADENOIDECTOMY     as a child  . BREAST LUMPECTOMY Right 1994  . BREAST SURGERY Right   . BUNIONECTOMY Right   . CARDIAC CATHETERIZATION  2014  . COLONOSCOPY    . FOOT SURGERY Right   . FRACTURE SURGERY     wrist right , right knee  . INSERTION OF MESH N/A 04/17/2015   Procedure: INSERTION OF MESH;  Surgeon: Ralene Ok, MD;  Location: [place];  Service: General;  Laterality: N/A;  . KNEE SURGERY    . LEFT HEART CATHETERIZATION WITH CORONARY ANGIOGRAM N/A 12/05/2012   Procedure: LEFT HEART CATHETERIZATION WITH CORONARY ANGIOGRAM;  Surgeon: [name] Sine, MD;  Location: [facility] CATH LAB;  Service: Cardiovascular;  Laterality: N/A;  . right  breast surgery     d/t breast cancer  . TONSILLECTOMY     as a child  . TOTAL KNEE ARTHROPLASTY Right 06/19/2013   Procedure: TOTAL KNEE ARTHROPLASTY;  Surgeon: Lorn Junes, MD;  Location: [place];  Service: Orthopedics;  Laterality: Right;  . TRANSURETHRAL RESECTION OF BLADDER TUMOR WITH GYRUS (TURBT-GYRUS)     01-26-18 Dr. Louis Meckel  . TRANSURETHRAL RESECTION OF BLADDER TUMOR WITH MITOMYCIN-C Bilateral 01/26/2018   Procedure: TRANSURETHRAL RESECTION OF BLADDER TUMOR WITH GEMCITABIN BLLATERAL RETROGRADE PYELOGRAM;  Surgeon: Ardis Hughs, MD;  Location[facility];  Service: Urology;  Laterality: Bilateral;  . TURBINATE REDUCTION    . UMBILICAL HERNIA REPAIR N/A 04/17/2015   Procedure: LAPAROSCOPIC UMBILICAL HERNIA REPAIR WITH MESH;  Surgeon: Ralene Ok, MD;  Location: [place];  Service: General;  Laterality: N/A;  . UPPER GASTROINTESTINAL ENDOSCOPY      There were no vitals filed for this visit.   Subjective Assessment - 04/22/20 1421    Subjective The patients neck isa hurting when she moves it but not as bad as it did.    Pertinent History osteoperosis, HTN, brest cancer, depression, lymphoma, osteopenia,    Limitations Standing;Walking    How  long can you walk comfortably? Has to walk with the device. Can walk around the store if needed    Diagnostic tests 1. No acute abnormality of the thoracic spine.2. T9 vertebra plana with 6 mm retropulsion causing mild spinal canal stenosis.Lumbar MRI: L2-L5 multi level degenration    Patient Stated Goals to have less and falls and less pain in her neck    Currently in Pain? No/denies              [facility] PT Assessment - 04/22/20 0001      Assessment   Medical Diagnosis Frequent falls/ Cervical pain     Referring Provider (PT) Dr Narda Amber       AROM   Cervical - Right Rotation 60    Cervical - Left Rotation 65      Strength   Overall Strength Comments left shoulder 5/5    Right Shoulder Flexion 4+/5    Right Shoulder External Rotation 4+/5    Right Shoulder Horizontal ABduction 4+/5      Palpation   Palpation comment improved spasming in right upper trap      Transfers   Five time sit to stand comments  29s with BUE assist                         [facility] Adult PT Treatment/Exercise - 04/22/20 0001      High Level Balance   High Level Balance Comments narrow base eyes opened and closed 2x30 sec each min gaurd with eyes closed eyes opened no assit; tandem stance min gaurd eyes opened 3x20 sec hold each foot      Knee/Hip Exercises: Aerobic   Nustep 6 mins; L3; arms and legs      Knee/Hip Exercises: Standing   Heel Raises 2 sets;10 reps    Heel Raises Limitations heel-toe behind bari chair    Hip Flexion Stengthening;Both;10 reps;2 sets      Knee/Hip Exercises: Seated   Long Arc Quad Strengthening;Both;10 reps;3 sets    Long Arc Quad Weight 3 lbs.    Sit to [facility] 2 sets;5 reps      Shoulder Exercises: Seated   Other Seated Exercises 2x10 bilateral red 2x10; horizantal abduction 2x10; shoulder flexion 2x10                  PT Education - 04/22/20 1422    Education Details reviewed exercises for home    Person(s) Educated Patient    Methods  Explanation;Demonstration;Tactile cues;Verbal cues  Comprehension Returned demonstration;Verbal cues required;Tactile cues required;Verbalized understanding            PT Short Term Goals - 04/22/20 1611      PT SHORT TERM GOAL #1   Title Therapy will review FOTO goals    Baseline reviewed with patient    Time 3    Period Weeks    Status Achieved    Target Date 01/30/20      PT SHORT TERM GOAL #2   Title Therapy will perfrom BERG balance scale and set goal accordingly    Baseline check next visit    Time 3    Period Weeks    Status Achieved      PT SHORT TERM GOAL #3   Title Patient will require CGA for narrow base balance    Baseline min A required at minimum to protect pt from losing balance    Time 3    Period Weeks    Status On-going    Target Date 03/19/20      PT SHORT TERM GOAL #4   Title Patient will reduce 5x sit to stand by 6 seconds    Baseline Pt requires cues for anterior scooting to edge of chair before standing with pt taking well over 30 seconds to perform 5 STS with B UE support from armrests on chair and CGA (req Min-Mod A to correct balance after 1st stand due to poor foot placement)    Time 3    Period Weeks    Status Achieved    Target Date 03/19/20             PT Long Term Goals - 04/22/20 1612      PT LONG TERM GOAL #1   Title Patient will demonstrate a 35% limitation on FOTO    Baseline FOTO has remained consitent    Time 3    Period Weeks    Status On-going      PT LONG TERM GOAL #2   Title Patient will demonstrate a <10 second sit to stand test to demonstrate decreased falls risk    Baseline continues to be limited    Time 6    Period Weeks    Status On-going      PT LONG TERM GOAL #3   Title Patient will demonstrate a relaistic improvement in BERG testing to show a reduced falls risk ( level to be determined next visit)    Baseline will review BERG    Time 6    Period Weeks    Status On-going                  Plan - 04/22/20 1456    Clinical Impression Statement Patient is nearing max benefit for therapy. She has not had a fall in >6 weeks. The pain in her neck is still present but has improved signifcantly. Her balance has imporved especcialy her posterior balance. With  ocking back on her heels she is abvle to self correct. She has been working on postural exercises but she can not maintain good posture without cuing. We will continue over the next few visits to work on pusture and mame sure that she is safe and indepdnenet with HEP in preperation for D/C. Her cervical ROM has improved. Her strength in her arms remains consistent. She continues to use her walker which is her safest option.    Personal Factors and Comorbidities Comorbidity 1;Comorbidity 2;Comorbidity 3+    Comorbidities osteoperosis, anxiety, thoracic  compression fracture    Examination-Activity Limitations Bend;Caring for Others;Carry;Dressing;Lift;Stand;Stairs;Squat;Reach Overhead;Locomotion Level    Examination-Participation Restrictions Cleaning;Meal Prep;Community Activity;Laundry;Shop    Stability/Clinical Decision Making Evolving/Moderate complexity    Clinical Decision Making Moderate    Rehab Potential Good    PT Frequency 2x / week    PT Duration 3 weeks    PT Treatment/Interventions ADLs/Self Care Home Management;Cryotherapy;Moist Heat;DME Instruction;Gait training;Stair training;Functional mobility training;Therapeutic activities;Therapeutic exercise;Neuromuscular re-education;Patient/family education;Manual techniques;Passive range of motion;Dry needling;Taping    PT Next Visit Plan continue to work on postural exercises and balance. . Likely D/C in 3 visits.    PT Home Exercise Plan Access Code [email or web address]: supine SLR, PPT + march, standing heel raises, standing toe raises at counter with support. Sitting exs for LAQ, marching, hip add sets, and hip abd c Tband.           Patient will benefit from skilled therapeutic  intervention in order to improve the following deficits and impairments:  Abnormal gait,Decreased range of motion,Increased fascial restricitons,Pain,Postural dysfunction,Decreased activity tolerance,Improper body mechanics,Impaired perceived functional ability,Difficulty walking,Decreased endurance,Decreased strength,Decreased safety awareness  Visit Diagnosis: Frequent falls  Abnormal posture  Cervicalgia     Problem List Patient Active Problem List   Diagnosis Date Noted  . Recurrent falls 11/16/2019  . Hypotension 11/15/2018  . Left shoulder pain 02/08/2018  . History of bladder cancer 02/08/2018  . Anemia, chronic disease 07/27/2017  . Weight loss 11/19/2015  . Chronic constipation 11/19/2015  . Other specified hypothyroidism 11/14/2015  . DJD (degenerative joint disease) of knee 06/19/2013  . Right knee DJD   . Hyperlipidemia   . Thyroid disease   . Chest pain 12/26/2012  . Unstable angina ([place]) 12/03/2012  . Osteoporosis 12/03/2012  . Palpitations 12/03/2012  . Dyslipidemia 12/03/2012  . History of breast cancer in female 07/06/2011  . History of non-Hodgkin's lymphoma 07/06/2011  . Hypothyroid 07/06/2011  . Chronic ITP (idiopathic thrombocytopenia) (HCC) 07/06/2011    Carney Living PT DPT  04/22/2020, 4:14 PM  [facility] [address] [place], [place], [address] Phone: [phone]   Fax:  [phone]  Name: JOYLENE WESCOTT MRN: [id number] Date of Birth: Jun 19, 1934

## 2020-04-23 ENCOUNTER — Ambulatory Visit: Payer: Medicare Other | Admitting: Physical Therapy

## 2020-04-23 DIAGNOSIS — R293 Abnormal posture: Secondary | ICD-10-CM | POA: Diagnosis not present

## 2020-04-23 DIAGNOSIS — M542 Cervicalgia: Secondary | ICD-10-CM

## 2020-04-23 DIAGNOSIS — R296 Repeated falls: Secondary | ICD-10-CM

## 2020-04-23 NOTE — Therapy (Signed)
[place], [place], [address] Phone: [phone]   Fax:  [phone]  Physical Therapy Treatment  Patient Details  Name: Darlene [name] MRN: [id number] Date of Birth: [birth year] Referring Provider (PT): Dr Narda Amber    Encounter Date: 04/23/2020   PT End of Session - 04/23/20 1439    Visit Number 22    Number of Visits 24    Date for PT Re-Evaluation 05/20/20    Authorization Type Medicare progress note done on 21    PT Start Time 1429    PT Stop Time 1500    PT Time Calculation (min) 31 min           Past Medical History:  Diagnosis Date  . Anxiety   . Breast cancer, stage 1, estrogen receptor negative ([place]) 07/06/2011  . Cancer of breast ([place])   . Cataract   . Chronic ITP (idiopathic thrombocytopenia) (HCC) 07/06/2011  . Constipation    takes Colace daily  . COPD (chronic obstructive pulmonary disease) (HCC)    mild  . Depression    "nervous break down age 38"  . Fast heart beat   . GERD (gastroesophageal reflux disease)    takes Nexium daily  . History of colon polyps   . History of depression    received shock treatments   . History of palpitations    takes Metoprolol daily  . History of vertigo   . HOH (hard of hearing)    has hearing aids  . Hyperlipidemia    takes Lovastatin daily  . Hypertension   . Hypothyroid 07/06/2011   takes SYnthroid daily  . Joint pain   . Joint swelling   . Lymphoma ([place])   . Lymphoma ([place])    B cell   . Lymphoma of lymph nodes of head, face, and/or neck ([place]) 07/06/2011  . Neuropathy   . Nocturia   . Osteopenia   . Osteoporosis    hx of and take Reclast  . Peripheral neuropathy   . Personal history of chemotherapy   . Personal history of radiation therapy   . Right knee DJD   . Shortness of breath dyspnea    with exertion  . Thyroid disease   . Umbilical hernia     Past Surgical History:  Procedure Laterality Date  . ADENOIDECTOMY      as a child  . BREAST LUMPECTOMY Right 1994  . BREAST SURGERY Right   . BUNIONECTOMY Right   . CARDIAC CATHETERIZATION  2014  . COLONOSCOPY    . FOOT SURGERY Right   . FRACTURE SURGERY     wrist right , right knee  . INSERTION OF MESH N/A 04/17/2015   Procedure: INSERTION OF MESH;  Surgeon: Ralene Ok, MD;  Location: [place];  Service: General;  Laterality: N/A;  . KNEE SURGERY    . LEFT HEART CATHETERIZATION WITH CORONARY ANGIOGRAM N/A 12/05/2012   Procedure: LEFT HEART CATHETERIZATION WITH CORONARY ANGIOGRAM;  Surgeon: [name] Sine, MD;  Location: [facility] CATH LAB;  Service: Cardiovascular;  Laterality: N/A;  . right  breast surgery     d/t breast cancer  . TONSILLECTOMY     as a child  . TOTAL KNEE ARTHROPLASTY Right 06/19/2013   Procedure: TOTAL KNEE ARTHROPLASTY;  Surgeon: Lorn Junes, MD;  Location: [place];  Service: Orthopedics;  Laterality: Right;  . TRANSURETHRAL RESECTION OF BLADDER TUMOR WITH GYRUS (TURBT-GYRUS)     01-26-18 Dr. Louis Meckel  .  TRANSURETHRAL RESECTION OF BLADDER TUMOR WITH MITOMYCIN-C Bilateral 01/26/2018   Procedure: TRANSURETHRAL RESECTION OF BLADDER TUMOR WITH GEMCITABIN BLLATERAL RETROGRADE PYELOGRAM;  Surgeon: Ardis Hughs, MD;  [facility];  Service: Urology;  Laterality: Bilateral;  . TURBINATE REDUCTION    . UMBILICAL HERNIA REPAIR N/A 04/17/2015   Procedure: LAPAROSCOPIC UMBILICAL HERNIA REPAIR WITH MESH;  Surgeon: Ralene Ok, MD;  Location: [facility];  Service: General;  Laterality: N/A;  . UPPER GASTROINTESTINAL ENDOSCOPY      There were no vitals filed for this visit.   Subjective Assessment - 04/23/20 1440    Subjective Patient reports her neck is feeling pretty good. She feels tired today. she feles like it may be because she exercisesed yesterday.    Limitations Standing;Walking    How long can you walk comfortably? Has to walk with the device. Can walk around the store if needed    Diagnostic tests 1. No acute abnormality of the  thoracic spine.2. T9 vertebra plana with 6 mm retropulsion causing mild spinal canal stenosis.Lumbar MRI: L2-L5 multi level degenration    Patient Stated Goals to have less and falls and less pain in her neck    Currently in Pain? No/denies                             [facility] Adult PT Treatment/Exercise - 04/23/20 0001      High Level Balance   High Level Balance Comments foward and back roicking with catch 2x20; march with limited UE support 2x10      Knee/Hip Exercises: Seated   Long Arc Quad Strengthening;Both;10 reps;3 sets    [employer] Limitations 2x10 bilateral      Shoulder Exercises: Seated   Other Seated Exercises 2x10 bilateral red 2x10; horizantal abduction 2x10; shoulder flexion 2x10      Manual Therapy   Soft tissue mobilization upper trap and peri-scapular area using IASTYM                   PT Education - 04/23/20 1441    Education Details reviewed balance exercises    Person(s) Educated Patient    Methods Explanation;Demonstration;Verbal cues;Tactile cues    Comprehension Verbalized understanding;Returned demonstration;Verbal cues required;Tactile cues required            PT Short Term Goals - 04/22/20 1611      PT SHORT TERM GOAL #1   Title Therapy will review FOTO goals    Baseline reviewed with patient    Time 3    Period Weeks    Status Achieved    Target Date 01/30/20      PT SHORT TERM GOAL #2   Title Therapy will perfrom BERG balance scale and set goal accordingly    Baseline check next visit    Time 3    Period Weeks    Status Achieved      PT SHORT TERM GOAL #3   Title Patient will require CGA for narrow base balance    Baseline min A required at minimum to protect pt from losing balance    Time 3    Period Weeks    Status On-going    Target Date 03/19/20      PT SHORT TERM GOAL #4   Title Patient will reduce 5x sit to stand by 6 seconds    Baseline Pt requires cues for anterior scooting to edge of chair  before standing with pt  taking well over 30 seconds to perform 5 STS with B UE support from armrests on chair and CGA (req Min-Mod A to correct balance after 1st stand due to poor foot placement)    Time 3    Period Weeks    Status Achieved    Target Date 03/19/20             PT Long Term Goals - 04/22/20 1612      PT LONG TERM GOAL #1   Title Patient will demonstrate a 35% limitation on FOTO    Baseline FOTO has remained consitent    Time 3    Period Weeks    Status On-going      PT LONG TERM GOAL #2   Title Patient will demonstrate a <10 second sit to stand test to demonstrate decreased falls risk    Baseline continues to be limited    Time 6    Period Weeks    Status On-going      PT LONG TERM GOAL #3   Title Patient will demonstrate a relaistic improvement in BERG testing to show a reduced falls risk ( level to be determined next visit)    Baseline will review BERG    Time 6    Period Weeks    Status On-going                 Plan - 04/23/20 1444    Clinical Impression Statement Patient tolerated exercises despite baseline fatigue.Therapy continues to focus on postural correction. She was a little late then needed to use the restroom so we had limited time today. We worked on Marketing executive today. She is doing a much better jo of self correcting with posterior balance. She also was able to march well with limited ue support but her left leg fdoes not support as well.    Comorbidities osteoperosis, anxiety, thoracic compression fracture    Examination-Activity Limitations Bend;Caring for Others;Carry;Dressing;Lift;Stand;Stairs;Squat;Reach Overhead;Locomotion Level    Stability/Clinical Decision Making Evolving/Moderate complexity    Clinical Decision Making Moderate    Rehab Potential Good    PT Frequency 2x / week    PT Duration 3 weeks    PT Treatment/Interventions ADLs/Self Care Home Management;Cryotherapy;Moist Heat;DME Instruction;Gait training;Stair  training;Functional mobility training;Therapeutic activities;Therapeutic exercise;Neuromuscular re-education;Patient/family education;Manual techniques;Passive range of motion;Dry needling;Taping    PT Next Visit Plan review HEP and symptom management    PT Home Exercise Plan Access Code [email or web address]: supine SLR, PPT + march, standing heel raises, standing toe raises at counter with support. Sitting exs for LAQ, marching, hip add sets, and hip abd c Tband.    Consulted and Agree with Plan of Care Patient           Patient will benefit from skilled therapeutic intervention in order to improve the following deficits and impairments:  Abnormal gait,Decreased range of motion,Increased fascial restricitons,Pain,Postural dysfunction,Decreased activity tolerance,Improper body mechanics,Impaired perceived functional ability,Difficulty walking,Decreased endurance,Decreased strength,Decreased safety awareness  Visit Diagnosis: Frequent falls  Abnormal posture  Cervicalgia     Problem List Patient Active Problem List   Diagnosis Date Noted  . Recurrent falls 11/16/2019  . Hypotension 11/15/2018  . Left shoulder pain 02/08/2018  . History of bladder cancer 02/08/2018  . Anemia, chronic disease 07/27/2017  . Weight loss 11/19/2015  . Chronic constipation 11/19/2015  . Other specified hypothyroidism 11/14/2015  . DJD (degenerative joint disease) of knee 06/19/2013  . Right knee DJD   . Hyperlipidemia   . Thyroid disease   .  Chest pain 12/26/2012  . Unstable angina ([place]) 12/03/2012  . Osteoporosis 12/03/2012  . Palpitations 12/03/2012  . Dyslipidemia 12/03/2012  . History of breast cancer in female 07/06/2011  . History of non-Hodgkin's lymphoma 07/06/2011  . Hypothyroid 07/06/2011  . Chronic ITP (idiopathic thrombocytopenia) (HCC) 07/06/2011    Carney Living PT DPT  04/23/2020, 3:46 PM  [facility] [address] [place],  [place], [address] Phone: [phone]   Fax:  [phone]  Name: CHEYLA DUCHEMIN MRN: [id number] Date of Birth: 05-29-1934

## 2020-04-30 ENCOUNTER — Ambulatory Visit: Payer: Medicare Other | Admitting: Physical Therapy

## 2020-04-30 ENCOUNTER — Encounter: Payer: Self-pay | Admitting: Physical Therapy

## 2020-04-30 ENCOUNTER — Other Ambulatory Visit: Payer: Self-pay

## 2020-04-30 DIAGNOSIS — M542 Cervicalgia: Secondary | ICD-10-CM | POA: Diagnosis not present

## 2020-04-30 DIAGNOSIS — R296 Repeated falls: Secondary | ICD-10-CM

## 2020-04-30 DIAGNOSIS — R293 Abnormal posture: Secondary | ICD-10-CM

## 2020-05-01 ENCOUNTER — Encounter: Payer: Self-pay | Admitting: Physical Therapy

## 2020-05-01 NOTE — Therapy (Signed)
[place], [place], [address] Phone: [phone]   Fax:  [phone]  Physical Therapy Treatment  Patient Details  Name: Darlene [name] MRN: [id number] Date of Birth: 25-Feb-1935 Referring Provider (PT): Dr Narda Amber    Encounter Date: 04/30/2020   PT End of Session - 05/01/20 1311    Visit Number 23    Number of Visits 24    Date for PT Re-Evaluation 05/20/20    Authorization Type Medicare progress note done on 21    PT Start Time 1415    PT Stop Time 1456    PT Time Calculation (min) 41 min    Activity Tolerance Patient tolerated treatment well    Behavior During Therapy [facility] for tasks assessed/performed           Past Medical History:  Diagnosis Date  . Anxiety   . Breast cancer, stage 1, estrogen receptor negative ([place]) 07/06/2011  . Cancer of breast ([place])   . Cataract   . Chronic ITP (idiopathic thrombocytopenia) (HCC) 07/06/2011  . Constipation    takes Colace daily  . COPD (chronic obstructive pulmonary disease) (HCC)    mild  . Depression    "nervous break down age 39"  . Fast heart beat   . GERD (gastroesophageal reflux disease)    takes Nexium daily  . History of colon polyps   . History of depression    received shock treatments   . History of palpitations    takes Metoprolol daily  . History of vertigo   . HOH (hard of hearing)    has hearing aids  . Hyperlipidemia    takes Lovastatin daily  . Hypertension   . Hypothyroid 07/06/2011   takes SYnthroid daily  . Joint pain   . Joint swelling   . Lymphoma ([place])   . Lymphoma ([place])    B cell   . Lymphoma of lymph nodes of head, face, and/or neck ([place]) 07/06/2011  . Neuropathy   . Nocturia   . Osteopenia   . Osteoporosis    hx of and take Reclast  . Peripheral neuropathy   . Personal history of chemotherapy   . Personal history of radiation therapy   . Right knee DJD   . Shortness of breath dyspnea    with exertion  .  Thyroid disease   . Umbilical hernia     Past Surgical History:  Procedure Laterality Date  . ADENOIDECTOMY     as a child  . BREAST LUMPECTOMY Right 1994  . BREAST SURGERY Right   . BUNIONECTOMY Right   . CARDIAC CATHETERIZATION  2014  . COLONOSCOPY    . FOOT SURGERY Right   . FRACTURE SURGERY     wrist right , right knee  . INSERTION OF MESH N/A 04/17/2015   Procedure: INSERTION OF MESH;  Surgeon: Ralene Ok, MD;  Location: [place];  Service: General;  Laterality: N/A;  . KNEE SURGERY    . LEFT HEART CATHETERIZATION WITH CORONARY ANGIOGRAM N/A 12/05/2012   Procedure: LEFT HEART CATHETERIZATION WITH CORONARY ANGIOGRAM;  Surgeon: [name] Sine, MD;  Location: [facility] CATH LAB;  Service: Cardiovascular;  Laterality: N/A;  . right  breast surgery     d/t breast cancer  . TONSILLECTOMY     as a child  . TOTAL KNEE ARTHROPLASTY Right 06/19/2013   Procedure: TOTAL KNEE ARTHROPLASTY;  Surgeon: Lorn Junes, MD;  Location: [place];  Service: Orthopedics;  Laterality:  Right;  Marland Kitchen TRANSURETHRAL RESECTION OF BLADDER TUMOR WITH GYRUS (TURBT-GYRUS)     01-26-18 Dr. Marlou Porch  . TRANSURETHRAL RESECTION OF BLADDER TUMOR WITH MITOMYCIN-C Bilateral 01/26/2018   Procedure: TRANSURETHRAL RESECTION OF BLADDER TUMOR WITH GEMCITABIN BLLATERAL RETROGRADE PYELOGRAM;  Surgeon: Crist Fat, MD;  Location[facility];  Service: Urology;  Laterality: Bilateral;  . TURBINATE REDUCTION    . UMBILICAL HERNIA REPAIR N/A 04/17/2015   Procedure: LAPAROSCOPIC UMBILICAL HERNIA REPAIR WITH MESH;  Surgeon: Axel Filler, MD;  [facility];  Service: General;  Laterality: N/A;  . UPPER GASTROINTESTINAL ENDOSCOPY      There were no vitals filed for this visit.   Subjective Assessment - 04/30/20 1424    Subjective Patient reports her neck hurts at times but it is tolerable. She has not had any falls.    Pertinent History osteoperosis, HTN, brest cancer, depression, lymphoma, osteopenia,    Limitations  Standing;Walking    How long can you walk comfortably? Has to walk with the device. Can walk around the store if needed    Diagnostic tests 1. No acute abnormality of the thoracic spine.2. T9 vertebra plana with 6 mm retropulsion causing mild spinal canal stenosis.Lumbar MRI: L2-L5 multi level degenration    Patient Stated Goals to have less and falls and less pain in her neck    Currently in Pain? No/denies                             [facility] Adult PT Treatment/Exercise - 05/01/20 0001      High Level Balance   High Level Balance Comments foward and back roicking with catch 2x20; march with limited UE support 2x10      Knee/Hip Exercises: Aerobic   Nustep 6 mins; L3; arms and legs      Knee/Hip Exercises: Seated   Long Arc Quad Limitations 2x10 bilateral      Shoulder Exercises: Seated   Other Seated Exercises 2x10 bilateral red 2x10; horizantal abduction 2x10; shoulder flexion 2x10    Other Seated Exercises scap retraction and extnesion red x15                  PT Education - 04/30/20 1427    Education Details reviewed HEP    Person(s) Educated Patient    Methods Explanation;Demonstration;Tactile cues;Verbal cues    Comprehension Verbalized understanding;Verbal cues required;Tactile cues required;Returned demonstration            PT Short Term Goals - 04/22/20 1611      PT SHORT TERM GOAL #1   Title Therapy will review FOTO goals    Baseline reviewed with patient    Time 3    Period Weeks    Status Achieved    Target Date 01/30/20      PT SHORT TERM GOAL #2   Title Therapy will perfrom BERG balance scale and set goal accordingly    Baseline check next visit    Time 3    Period Weeks    Status Achieved      PT SHORT TERM GOAL #3   Title Patient will require CGA for narrow base balance    Baseline min A required at minimum to protect pt from losing balance    Time 3    Period Weeks    Status On-going    Target Date 03/19/20      PT  SHORT TERM GOAL #4   Title Patient will  reduce 5x sit to stand by 6 seconds    Baseline Pt requires cues for anterior scooting to edge of chair before standing with pt taking well over 30 seconds to perform 5 STS with B UE support from armrests on chair and CGA (req Min-Mod A to correct balance after 1st stand due to poor foot placement)    Time 3    Period Weeks    Status Achieved    Target Date 03/19/20             PT Long Term Goals - 04/22/20 1612      PT LONG TERM GOAL #1   Title Patient will demonstrate a 35% limitation on FOTO    Baseline FOTO has remained consitent    Time 3    Period Weeks    Status On-going      PT LONG TERM GOAL #2   Title Patient will demonstrate a <10 second sit to stand test to demonstrate decreased falls risk    Baseline continues to be limited    Time 6    Period Weeks    Status On-going      PT LONG TERM GOAL #3   Title Patient will demonstrate a relaistic improvement in BERG testing to show a reduced falls risk ( level to be determined next visit)    Baseline will review BERG    Time 6    Period Weeks    Status On-going                 Plan - 05/01/20 1312    Clinical Impression Statement Therapy focsued on pstural exercises today. She tolerated well. She becomes fatigued with ther-ex but she is able to tolerate ther-ex. She require intemittent assistwith balance exercises. We will review exercises with her next time in preperation for discharge .    Personal Factors and Comorbidities Comorbidity 1;Comorbidity 2;Comorbidity 3+    Comorbidities osteoperosis, anxiety, thoracic compression fracture    Examination-Activity Limitations Bend;Caring for Others;Carry;Dressing;Lift;Stand;Stairs;Squat;Reach Overhead;Locomotion Level    Examination-Participation Restrictions Cleaning;Meal Prep;Community Activity;Laundry;Shop    Stability/Clinical Decision Making Evolving/Moderate complexity    Clinical Decision Making Moderate    Rehab  Potential Good    PT Frequency 2x / week    PT Duration 3 weeks    PT Treatment/Interventions ADLs/Self Care Home Management;Cryotherapy;Moist Heat;DME Instruction;Gait training;Stair training;Functional mobility training;Therapeutic activities;Therapeutic exercise;Neuromuscular re-education;Patient/family education;Manual techniques;Passive range of motion;Dry needling;Taping    PT Next Visit Plan review HEP and symptom management    PT Home Exercise Plan Access Code [email or web address]: supine SLR, PPT + march, standing heel raises, standing toe raises at counter with support. Sitting exs for LAQ, marching, hip add sets, and hip abd c Tband.    Consulted and Agree with Plan of Care Patient           Patient will benefit from skilled therapeutic intervention in order to improve the following deficits and impairments:  Abnormal gait,Decreased range of motion,Increased fascial restricitons,Pain,Postural dysfunction,Decreased activity tolerance,Improper body mechanics,Impaired perceived functional ability,Difficulty walking,Decreased endurance,Decreased strength,Decreased safety awareness  Visit Diagnosis: Frequent falls  Abnormal posture  Cervicalgia     Problem List Patient Active Problem List   Diagnosis Date Noted  . Recurrent falls 11/16/2019  . Hypotension 11/15/2018  . Left shoulder pain 02/08/2018  . History of bladder cancer 02/08/2018  . Anemia, chronic disease 07/27/2017  . Weight loss 11/19/2015  . Chronic constipation 11/19/2015  . Other specified hypothyroidism 11/14/2015  . DJD (degenerative joint disease) of  knee 06/19/2013  . Right knee DJD   . Hyperlipidemia   . Thyroid disease   . Chest pain 12/26/2012  . Unstable angina ([place]) 12/03/2012  . Osteoporosis 12/03/2012  . Palpitations 12/03/2012  . Dyslipidemia 12/03/2012  . History of breast cancer in female 07/06/2011  . History of non-Hodgkin's lymphoma 07/06/2011  . Hypothyroid 07/06/2011  . Chronic ITP (idiopathic  thrombocytopenia) (HCC) 07/06/2011    Carney Living PT DPT  05/01/2020, 1:21 PM  [facility] [address] [place], [place], [address] Phone: [phone]   Fax:  [phone]  Name: Darlene [name] MRN: [id number] Date of Birth: [birth year]

## 2020-05-02 ENCOUNTER — Ambulatory Visit: Payer: Medicare Other | Admitting: Physical Therapy

## 2020-05-02 ENCOUNTER — Encounter: Payer: Self-pay | Admitting: Physical Therapy

## 2020-05-02 ENCOUNTER — Other Ambulatory Visit: Payer: Self-pay

## 2020-05-02 DIAGNOSIS — R293 Abnormal posture: Secondary | ICD-10-CM

## 2020-05-02 DIAGNOSIS — M542 Cervicalgia: Secondary | ICD-10-CM

## 2020-05-02 DIAGNOSIS — R296 Repeated falls: Secondary | ICD-10-CM

## 2020-05-02 NOTE — Therapy (Addendum)
[place], [place], [address] Phone: [phone]   Fax:  [phone]  Physical Therapy Treatment/Discharge   Patient Details  Name: Darlene [name] MRN: [id number] Date of Birth: April 11, 1935 Referring Provider (PT): Dr Narda Amber    Encounter Date: 05/02/2020   PT End of Session - 05/02/20 1703    Visit Number 24    Number of Visits 24    Date for PT Re-Evaluation 05/20/20    Authorization Type Medicare progress note done on 21    PT Start Time 0930    PT Stop Time 1012    PT Time Calculation (min) 42 min    Activity Tolerance Patient tolerated treatment well    Behavior During Therapy [facility] for tasks assessed/performed           Past Medical History:  Diagnosis Date  . Anxiety   . Breast cancer, stage 1, estrogen receptor negative ([place]) 07/06/2011  . Cancer of breast ([place])   . Cataract   . Chronic ITP (idiopathic thrombocytopenia) (HCC) 07/06/2011  . Constipation    takes Colace daily  . COPD (chronic obstructive pulmonary disease) (HCC)    mild  . Depression    "nervous break down age 50"  . Fast heart beat   . GERD (gastroesophageal reflux disease)    takes Nexium daily  . History of colon polyps   . History of depression    received shock treatments   . History of palpitations    takes Metoprolol daily  . History of vertigo   . HOH (hard of hearing)    has hearing aids  . Hyperlipidemia    takes Lovastatin daily  . Hypertension   . Hypothyroid 07/06/2011   takes SYnthroid daily  . Joint pain   . Joint swelling   . Lymphoma ([place])   . Lymphoma ([place])    B cell   . Lymphoma of lymph nodes of head, face, and/or neck ([place]) 07/06/2011  . Neuropathy   . Nocturia   . Osteopenia   . Osteoporosis    hx of and take Reclast  . Peripheral neuropathy   . Personal history of chemotherapy   . Personal history of radiation therapy   . Right knee DJD   . Shortness of breath dyspnea    with  exertion  . Thyroid disease   . Umbilical hernia     Past Surgical History:  Procedure Laterality Date  . ADENOIDECTOMY     as a child  . BREAST LUMPECTOMY Right 1994  . BREAST SURGERY Right   . BUNIONECTOMY Right   . CARDIAC CATHETERIZATION  2014  . COLONOSCOPY    . FOOT SURGERY Right   . FRACTURE SURGERY     wrist right , right knee  . INSERTION OF MESH N/A 04/17/2015   Procedure: INSERTION OF MESH;  Surgeon: Ralene Ok, MD;  Location: [place];  Service: General;  Laterality: N/A;  . KNEE SURGERY    . LEFT HEART CATHETERIZATION WITH CORONARY ANGIOGRAM N/A 12/05/2012   Procedure: LEFT HEART CATHETERIZATION WITH CORONARY ANGIOGRAM;  Surgeon: [name] Sine, MD;  Location: [facility] CATH LAB;  Service: Cardiovascular;  Laterality: N/A;  . right  breast surgery     d/t breast cancer  . TONSILLECTOMY     as a child  . TOTAL KNEE ARTHROPLASTY Right 06/19/2013   Procedure: TOTAL KNEE ARTHROPLASTY;  Surgeon: Lorn Junes, MD;  Location: [place];  Service: Orthopedics;  Laterality: Right;  . TRANSURETHRAL RESECTION OF BLADDER TUMOR WITH GYRUS (TURBT-GYRUS)     01-26-18 Dr. Louis Meckel  . TRANSURETHRAL RESECTION OF BLADDER TUMOR WITH MITOMYCIN-C Bilateral 01/26/2018   Procedure: TRANSURETHRAL RESECTION OF BLADDER TUMOR WITH GEMCITABIN BLLATERAL RETROGRADE PYELOGRAM;  Surgeon: Ardis Hughs, MD;  Location[facility];  Service: Urology;  Laterality: Bilateral;  . TURBINATE REDUCTION    . UMBILICAL HERNIA REPAIR N/A 04/17/2015   Procedure: LAPAROSCOPIC UMBILICAL HERNIA REPAIR WITH MESH;  Surgeon: Ralene Ok, MD;  Location: [place];  Service: General;  Laterality: N/A;  . UPPER GASTROINTESTINAL ENDOSCOPY      There were no vitals filed for this visit.   Subjective Assessment - 05/02/20 0945    Subjective Patient continues to do well. She has not had a fall. Her neck pain comes and goes.    Pertinent History osteoperosis, HTN, brest cancer, depression, lymphoma, osteopenia,    Limitations  Standing;Walking    How long can you walk comfortably? Has to walk with the device. Can walk around the store if needed    Diagnostic tests 1. No acute abnormality of the thoracic spine.2. T9 vertebra plana with 6 mm retropulsion causing mild spinal canal stenosis.Lumbar MRI: L2-L5 multi level degenration    Patient Stated Goals to have less and falls and less pain in her neck    Currently in Pain? No/denies              [facility] PT Assessment - 05/02/20 0001      Assessment   Medical Diagnosis Frequent falls/ Cervical pain     Referring Provider (PT) Dr Narda Amber                          [facility] Adult PT Treatment/Exercise - 05/02/20 0001      High Level Balance   High Level Balance Comments foward and back roicking with catch 2x20; march with limited UE support 2x10      Knee/Hip Exercises: Aerobic   Nustep 6 mins; L3; arms and legs      Knee/Hip Exercises: Seated   Long Arc Quad Limitations 2x10 bilateral    Other Seated Knee/Hip Exercises seated march 2x145; hamstring curl 2x15 green; seated clamshell 2x20 green      Shoulder Exercises: Seated   Other Seated Exercises 2x10 bilateral red 2x10; horizantal abduction 2x10; shoulder flexion 2x10    Other Seated Exercises scap retraction and extnesion red x15                  PT Education - 05/02/20 1703    Education Details HEP and symptom management    Person(s) Educated Patient    Methods Explanation;Demonstration;Verbal cues;Tactile cues    Comprehension Verbalized understanding;Returned demonstration;Verbal cues required;Tactile cues required            PT Short Term Goals - 05/02/20 1707      PT SHORT TERM GOAL #1   Title Therapy will review FOTO goals    Baseline reviewed with patient    Time 3    Period Weeks    Status Achieved    Target Date 01/30/20      PT SHORT TERM GOAL #2   Title Therapy will perfrom BERG balance scale and set goal accordingly    Time 3    Period Weeks     Status Achieved    Target Date 01/30/20      PT SHORT TERM GOAL #3  Title Patient will require CGA for narrow base balance    Baseline CGA with balance on the floor min gaurd with balance pad    Time 3    Period Weeks    Status Achieved    Target Date 03/19/20      PT SHORT TERM GOAL #4   Title Patient will reduce 5x sit to stand by 6 seconds    Baseline Pt requires cues for anterior scooting to edge of chair before standing with pt taking well over 30 seconds to perform 5 STS with B UE support from armrests on chair and CGA (req Min-Mod A to correct balance after 1st stand due to poor foot placement)    Time 3    Period Weeks    Status Achieved    Target Date 03/19/20             PT Long Term Goals - 05/02/20 1708      PT LONG TERM GOAL #1   Title Patient will demonstrate a 35% limitation on FOTO    Baseline 37&% limitation    Time 3    Period Weeks    Status On-going      PT LONG TERM GOAL #2   Title Patient will demonstrate a <10 second sit to stand test to demonstrate decreased falls risk    Baseline < 10 sec    Time 6    Period Weeks    Status Achieved      PT LONG TERM GOAL #3   Title Patient will demonstrate a relaistic improvement in BERG testing to show a reduced falls risk ( level to be determined next visit)    Baseline will review BERG    Period Weeks    Status Not Met                 Plan - 05/02/20 1704    Clinical Impression Statement Patient has reached max potential for therapy. She has not fallen in several months. She reports she had a loss of balance the other day and was able to catch herself. She has intemittent pain in her neck but she reports it is not bad. She has aprogram for postural correction which will likely help if she is able to do it consitently but her posture will likley continue to contribute to her neck pain. Therapy reviewed her exercises with her. Sheis ready for discharge to her HEP.    Personal Factors and  Comorbidities Comorbidity 1;Comorbidity 2;Comorbidity 3+    Comorbidities osteoperosis, anxiety, thoracic compression fracture    Examination-Activity Limitations Bend;Caring for Others;Carry;Dressing;Lift;Stand;Stairs;Squat;Reach Overhead;Locomotion Level    Examination-Participation Restrictions Cleaning;Meal Prep;Community Activity;Laundry;Shop    Stability/Clinical Decision Making Evolving/Moderate complexity    Clinical Decision Making Moderate    Rehab Potential Good    PT Frequency 2x / week    PT Duration 3 weeks    PT Treatment/Interventions ADLs/Self Care Home Management;Cryotherapy;Moist Heat;DME Instruction;Gait training;Stair training;Functional mobility training;Therapeutic activities;Therapeutic exercise;Neuromuscular re-education;Patient/family education;Manual techniques;Passive range of motion;Dry needling;Taping    PT Next Visit Plan review HEP and symptom management    PT Home Exercise Plan Access Code [email or web address]: supine SLR, PPT + march, standing heel raises, standing toe raises at counter with support. Sitting exs for LAQ, marching, hip add sets, and hip abd c Tband.    Consulted and Agree with Plan of Care Patient           Patient will benefit from skilled therapeutic intervention in order to improve the  following deficits and impairments:  Abnormal gait,Decreased range of motion,Increased fascial restricitons,Pain,Postural dysfunction,Decreased activity tolerance,Improper body mechanics,Impaired perceived functional ability,Difficulty walking,Decreased endurance,Decreased strength,Decreased safety awareness  Visit Diagnosis: Frequent falls  Abnormal posture  Cervicalgia  PHYSICAL THERAPY DISCHARGE SUMMARY  Visits from Start of Care: 24  Current functional level related to goals / functional outcomes: No falls in several months; significant improvement in neeck pain   Remaining deficits: Neck pain at times   Education / Equipment: HEP  Plan: Patient  agrees to discharge.  Patient goals were partially met. Patient is being discharged due to being pleased with the current functional level.  ?????       Problem List Patient Active Problem List   Diagnosis Date Noted  . Recurrent falls 11/16/2019  . Hypotension 11/15/2018  . Left shoulder pain 02/08/2018  . History of bladder cancer 02/08/2018  . Anemia, chronic disease 07/27/2017  . Weight loss 11/19/2015  . Chronic constipation 11/19/2015  . Other specified hypothyroidism 11/14/2015  . DJD (degenerative joint disease) of knee 06/19/2013  . Right knee DJD   . Hyperlipidemia   . Thyroid disease   . Chest pain 12/26/2012  . Unstable angina ([place]) 12/03/2012  . Osteoporosis 12/03/2012  . Palpitations 12/03/2012  . Dyslipidemia 12/03/2012  . History of breast cancer in female 07/06/2011  . History of non-Hodgkin's lymphoma 07/06/2011  . Hypothyroid 07/06/2011  . Chronic ITP (idiopathic thrombocytopenia) (HCC) 07/06/2011    Carney Living  PT DPT  05/02/2020, 5:15 PM  [facility] [address] [place], [place], [address] Phone: [phone]   Fax:  [phone]  Name: Darlene [name] MRN: [id number] Date of Birth: 1934-05-27

## 2020-06-11 DIAGNOSIS — E785 Hyperlipidemia, unspecified: Secondary | ICD-10-CM | POA: Diagnosis not present

## 2020-06-14 ENCOUNTER — Ambulatory Visit (INDEPENDENT_AMBULATORY_CARE_PROVIDER_SITE_OTHER): Payer: Medicare Other | Admitting: Podiatry

## 2020-06-14 ENCOUNTER — Encounter: Payer: Self-pay | Admitting: Podiatry

## 2020-06-14 ENCOUNTER — Other Ambulatory Visit: Payer: Self-pay

## 2020-06-14 DIAGNOSIS — B351 Tinea unguium: Secondary | ICD-10-CM

## 2020-06-14 DIAGNOSIS — M79676 Pain in unspecified toe(s): Secondary | ICD-10-CM

## 2020-06-14 NOTE — Progress Notes (Signed)
This patient returns to the office for evaluation and treatment of long thick painful nails .  This patient is unable to trim her own nails since the patient cannot reach her feet.  Patient says the nails are painful walking and wearing his shoes.  She returns for preventive foot care services.  General Appearance  Alert, conversant and in no acute stress.  Vascular  Dorsalis pedis and posterior tibial  pulses are palpable  bilaterally.  Capillary return is within normal limits  bilaterally. Temperature is within normal limits  bilaterally.  Neurologic  Senn-Weinstein monofilament wire test within normal limits  bilaterally. Muscle power within normal limits bilaterally.  Nails Thick disfigured discolored nails with subungual debris  from hallux to fifth toes bilaterally. No evidence of bacterial infection or drainage bilaterally.  Orthopedic  No limitations of motion  feet .  No crepitus or effusions noted.  No bony pathology or digital deformities noted.  Hallux varus 1st MPJ and hallux malleus right foot.   Hammer toes 2,3 right.  Skin  normotropic skin with no porokeratosis noted bilaterally.  No signs of infections or ulcers noted.     Onychomycosis  Pain in toes right foot  Pain in toes left foot  Debridement  of nails  1-5  B/L with a nail nipper.  Nails were then filed using a dremel tool with no incidents.    RTC 3 months    Yana Schorr DPM  

## 2020-06-25 DIAGNOSIS — E039 Hypothyroidism, unspecified: Secondary | ICD-10-CM | POA: Diagnosis not present

## 2020-06-25 DIAGNOSIS — K219 Gastro-esophageal reflux disease without esophagitis: Secondary | ICD-10-CM | POA: Diagnosis not present

## 2020-06-25 DIAGNOSIS — E559 Vitamin D deficiency, unspecified: Secondary | ICD-10-CM | POA: Diagnosis not present

## 2020-06-25 DIAGNOSIS — F339 Major depressive disorder, recurrent, unspecified: Secondary | ICD-10-CM | POA: Diagnosis not present

## 2020-06-25 DIAGNOSIS — L219 Seborrheic dermatitis, unspecified: Secondary | ICD-10-CM | POA: Diagnosis not present

## 2020-06-25 DIAGNOSIS — E785 Hyperlipidemia, unspecified: Secondary | ICD-10-CM | POA: Diagnosis not present

## 2020-06-25 DIAGNOSIS — I10 Essential (primary) hypertension: Secondary | ICD-10-CM | POA: Diagnosis not present

## 2020-07-09 DIAGNOSIS — H52203 Unspecified astigmatism, bilateral: Secondary | ICD-10-CM | POA: Diagnosis not present

## 2020-07-09 DIAGNOSIS — Z961 Presence of intraocular lens: Secondary | ICD-10-CM | POA: Diagnosis not present

## 2020-07-30 DIAGNOSIS — H6122 Impacted cerumen, left ear: Secondary | ICD-10-CM | POA: Diagnosis not present

## 2020-09-13 ENCOUNTER — Ambulatory Visit (INDEPENDENT_AMBULATORY_CARE_PROVIDER_SITE_OTHER): Payer: Medicare Other | Admitting: Podiatry

## 2020-09-13 ENCOUNTER — Encounter: Payer: Self-pay | Admitting: Podiatry

## 2020-09-13 ENCOUNTER — Other Ambulatory Visit: Payer: Self-pay

## 2020-09-13 DIAGNOSIS — M79676 Pain in unspecified toe(s): Secondary | ICD-10-CM | POA: Diagnosis not present

## 2020-09-13 DIAGNOSIS — B351 Tinea unguium: Secondary | ICD-10-CM | POA: Diagnosis not present

## 2020-09-13 NOTE — Progress Notes (Signed)
This patient returns to the office for evaluation and treatment of long thick painful nails .  This patient is unable to trim her own nails since the patient cannot reach her feet.  Patient says the nails are painful walking and wearing his shoes.  She returns for preventive foot care services.  General Appearance  Alert, conversant and in no acute stress.  Vascular  Dorsalis pedis and posterior tibial  pulses are palpable  bilaterally.  Capillary return is within normal limits  bilaterally. Temperature is within normal limits  bilaterally.  Neurologic  Senn-Weinstein monofilament wire test within normal limits  bilaterally. Muscle power within normal limits bilaterally.  Nails Thick disfigured discolored nails with subungual debris  from hallux to fifth toes bilaterally. No evidence of bacterial infection or drainage bilaterally.  Orthopedic  No limitations of motion  feet .  No crepitus or effusions noted.  No bony pathology or digital deformities noted.  Hallux varus 1st MPJ and hallux malleus right foot.   Hammer toes 2,3 right.  Skin  normotropic skin with no porokeratosis noted bilaterally.  No signs of infections or ulcers noted.     Onychomycosis  Pain in toes right foot  Pain in toes left foot  Debridement  of nails  1-5  B/L with a nail nipper.  Nails were then filed using a dremel tool with no incidents.    RTC 3 months    Zanyla Klebba DPM  

## 2020-10-01 ENCOUNTER — Other Ambulatory Visit: Payer: Self-pay | Admitting: Registered Nurse

## 2020-10-01 DIAGNOSIS — Z1231 Encounter for screening mammogram for malignant neoplasm of breast: Secondary | ICD-10-CM

## 2020-11-15 ENCOUNTER — Inpatient Hospital Stay: Payer: Medicare Other

## 2020-11-15 ENCOUNTER — Ambulatory Visit: Payer: Medicare Other | Admitting: Hematology and Oncology

## 2020-11-26 ENCOUNTER — Ambulatory Visit
Admission: RE | Admit: 2020-11-26 | Discharge: 2020-11-26 | Disposition: A | Discharge status: Home / Self Care | Payer: Medicare Other | Source: Ambulatory Visit | Attending: Registered Nurse | Admitting: Registered Nurse

## 2020-11-26 ENCOUNTER — Other Ambulatory Visit: Payer: Self-pay

## 2020-11-26 DIAGNOSIS — Z1231 Encounter for screening mammogram for malignant neoplasm of breast: Secondary | ICD-10-CM

## 2020-12-02 DIAGNOSIS — L218 Other seborrheic dermatitis: Secondary | ICD-10-CM | POA: Diagnosis not present

## 2020-12-02 DIAGNOSIS — L821 Other seborrheic keratosis: Secondary | ICD-10-CM | POA: Diagnosis not present

## 2020-12-02 DIAGNOSIS — Z85828 Personal history of other malignant neoplasm of skin: Secondary | ICD-10-CM | POA: Diagnosis not present

## 2020-12-02 DIAGNOSIS — D692 Other nonthrombocytopenic purpura: Secondary | ICD-10-CM | POA: Diagnosis not present

## 2020-12-02 DIAGNOSIS — L814 Other melanin hyperpigmentation: Secondary | ICD-10-CM | POA: Diagnosis not present

## 2020-12-05 ENCOUNTER — Other Ambulatory Visit: Payer: Self-pay

## 2020-12-05 ENCOUNTER — Inpatient Hospital Stay: Payer: Medicare Other | Attending: Hematology and Oncology

## 2020-12-05 ENCOUNTER — Inpatient Hospital Stay (HOSPITAL_BASED_OUTPATIENT_CLINIC_OR_DEPARTMENT_OTHER): Payer: Medicare Other | Admitting: Hematology and Oncology

## 2020-12-05 ENCOUNTER — Encounter: Payer: Self-pay | Admitting: Hematology and Oncology

## 2020-12-05 VITALS — BP 137/66 | HR 68 | Temp 98.1°F | Resp 18 | Wt 175.2 lb

## 2020-12-05 DIAGNOSIS — Z882 Allergy status to sulfonamides status: Secondary | ICD-10-CM | POA: Diagnosis not present

## 2020-12-05 DIAGNOSIS — D693 Immune thrombocytopenic purpura: Secondary | ICD-10-CM

## 2020-12-05 DIAGNOSIS — Z885 Allergy status to narcotic agent status: Secondary | ICD-10-CM | POA: Diagnosis not present

## 2020-12-05 DIAGNOSIS — D539 Nutritional anemia, unspecified: Secondary | ICD-10-CM | POA: Diagnosis not present

## 2020-12-05 DIAGNOSIS — Z8572 Personal history of non-Hodgkin lymphomas: Secondary | ICD-10-CM | POA: Insufficient documentation

## 2020-12-05 DIAGNOSIS — Z853 Personal history of malignant neoplasm of breast: Secondary | ICD-10-CM | POA: Diagnosis not present

## 2020-12-05 DIAGNOSIS — Z79899 Other long term (current) drug therapy: Secondary | ICD-10-CM | POA: Insufficient documentation

## 2020-12-05 DIAGNOSIS — Z8551 Personal history of malignant neoplasm of bladder: Secondary | ICD-10-CM

## 2020-12-05 DIAGNOSIS — Z881 Allergy status to other antibiotic agents status: Secondary | ICD-10-CM | POA: Insufficient documentation

## 2020-12-05 LAB — CBC WITH DIFFERENTIAL/PLATELET
Abs Immature Granulocytes: 0.01 10*3/uL (ref 0.00–0.07)
Basophils Absolute: 0.1 10*3/uL (ref 0.0–0.1)
Basophils Relative: 1 %
Eosinophils Absolute: 0.1 10*3/uL (ref 0.0–0.5)
Eosinophils Relative: 2 %
HCT: 33.1 % — ABNORMAL LOW (ref 36.0–46.0)
Hemoglobin: 10.7 g/dL — ABNORMAL LOW (ref 12.0–15.0)
Immature Granulocytes: 0 %
Lymphocytes Relative: 19 %
Lymphs Abs: 1.4 10*3/uL (ref 0.7–4.0)
MCH: 27.2 pg (ref 26.0–34.0)
MCHC: 32.3 g/dL (ref 30.0–36.0)
MCV: 84.2 fL (ref 80.0–100.0)
Monocytes Absolute: 0.5 10*3/uL (ref 0.1–1.0)
Monocytes Relative: 7 %
Neutro Abs: 5.4 10*3/uL (ref 1.7–7.7)
Neutrophils Relative %: 71 %
Platelets: 119 10*3/uL — ABNORMAL LOW (ref 150–400)
RBC: 3.93 MIL/uL (ref 3.87–5.11)
RDW: 14.5 % (ref 11.5–15.5)
WBC: 7.6 10*3/uL (ref 4.0–10.5)
nRBC: 0 % (ref 0.0–0.2)

## 2020-12-05 NOTE — Assessment & Plan Note (Signed)
She has fluctuation of her platelet count She does not need treatment

## 2020-12-05 NOTE — Assessment & Plan Note (Signed)
I examined her today At the site of previous biopsy on the left supraclavicular region, she had a palpable lump but it is 100% mobile and does not resemble a lymph node, rather feels like a lipoma on exam Recommend observation only I will see her again in 6 months for further follow-up

## 2020-12-05 NOTE — Assessment & Plan Note (Signed)
Her recent mammogram is stable Examination is benign Observe only

## 2020-12-05 NOTE — Assessment & Plan Note (Signed)
She have slight worsening anemia I will order B12 and iron studies in her next visit

## 2020-12-05 NOTE — Progress Notes (Signed)
[place] OFFICE PROGRESS NOTE  Patient Care Team: Holland Commons, FNP as PCP - General (Internal Medicine)  ASSESSMENT & PLAN:  History of non-Hodgkin's lymphoma I examined her today At the site of previous biopsy on the left supraclavicular region, she had a palpable lump but it is 100% mobile and does not resemble a lymph node, rather feels like a lipoma on exam Recommend observation only I will see her again in 6 months for further follow-up  History of breast cancer in female Her recent mammogram is stable Examination is benign Observe only  History of bladder cancer She will continue close observation with urologist  Chronic ITP (idiopathic thrombocytopenia) ([place]) She has fluctuation of her platelet count She does not need treatment  Deficiency anemia She have slight worsening anemia I will order B12 and iron studies in her next visit  Orders Placed This Encounter  Procedures   Iron and TIBC    Standing Status:   Future    Standing Expiration Date:   12/05/2021   Ferritin    Standing Status:   Future    Standing Expiration Date:   12/05/2021   Vitamin B12    Standing Status:   Future    Standing Expiration Date:   12/05/2021   Sedimentation rate    Standing Status:   Future    Standing Expiration Date:   12/05/2021   CBC with Differential/Platelet    Standing Status:   Future    Standing Expiration Date:   12/05/2021   Comprehensive metabolic panel    Standing Status:   Future    Standing Expiration Date:   12/05/2021   Lactate dehydrogenase    Standing Status:   Future    Standing Expiration Date:   12/05/2021    All questions were answered. The patient knows to call the clinic with any problems, questions or concerns. The total time spent in the appointment was 25 minutes encounter with patients including review of chart and various tests results, discussions about plan of care and coordination of care plan   Heath Lark, MD 12/05/2020 12:47  PM  INTERVAL HISTORY: Please see below for problem oriented charting.  SUMMARY OF ONCOLOGIC HISTORY: Oncology History   No history exists.    REVIEW OF SYSTEMS:   Constitutional: Denies fevers, chills or abnormal weight loss Eyes: Denies blurriness of vision Ears, nose, mouth, throat, and face: Denies mucositis or sore throat Respiratory: Denies cough, dyspnea or wheezes Cardiovascular: Denies palpitation, chest discomfort or lower extremity swelling Gastrointestinal:  Denies nausea, heartburn or change in bowel habits Skin: Denies abnormal skin rashes Lymphatics: Denies new lymphadenopathy or easy bruising Neurological:Denies numbness, tingling or new weaknesses Behavioral/Psych: Mood is stable, no new changes  All other systems were reviewed with the patient and are negative.  I have reviewed the past medical history, past surgical history, social history and family history with the patient and they are unchanged from previous note.  ALLERGIES:  is allergic to oxycodone, codeine, doxycycline, meperidine and related, sulfa antibiotics, and sulfacetamide sodium.  MEDICATIONS:  Current Outpatient Medications  Medication Sig Dispense Refill   acetaminophen (TYLENOL) 500 MG tablet Take 1 tablet (500 mg total) by mouth every 6 (six) hours as needed for mild pain. (Patient taking differently: Take 1,000 mg by mouth every 8 (eight) hours as needed for mild pain.) 30 tablet 0   alclomethasone (ACLOVATE) 0.05 % cream SMARTSIG:1 Topical Every Night     amoxicillin (AMOXIL) 500 MG capsule Take  2,000 mg by mouth once as needed (One hour prior to dental appointment).      aspirin 81 MG tablet Take 81 mg by mouth daily.     augmented betamethasone dipropionate (DIPROLENE-AF) 0.05 % ointment Apply 1 application topically daily as needed (itching).      Calcium Carbonate-Vit D-Min (CALTRATE 600+D PLUS MINERALS) 600-800 MG-UNIT CHEW Chew 2 each by mouth 2 (two) times daily.     cholecalciferol  (VITAMIN D) 1000 UNITS tablet Take 1,000 Units by mouth daily.     citalopram (CELEXA) 40 MG tablet Take 40 mg by mouth daily.      Cyanocobalamin (VITAMIN B-12 PO) Take 1,000 mcg by mouth daily.     diazepam (VALIUM) 5 MG tablet Take 1 tablet (5 mg total) by mouth every 6 (six) hours as needed for anxiety (Take 30-min prior MRI). 1 tablet 0   diclofenac Sodium (VOLTAREN) 1 % GEL diclofenac 1 % topical gel     Difluprednate 0.05 % EMUL Place 2 drops into the right eye 2 (two) times daily.      ergocalciferol (VITAMIN D2) 1.25 MG (50000 UT) capsule      esomeprazole (NEXIUM) 40 MG capsule Take 40 mg by mouth 2 (two) times daily before a meal.     famciclovir (FAMVIR) 125 MG tablet      L-Methylfolate-Algae-B12-B6 (METANX) 3-90.314-2-35 MG CAPS      levothyroxine (SYNTHROID, LEVOTHROID) 75 MCG tablet Take 75 mcg by mouth daily before breakfast.      lovastatin (MEVACOR) 20 MG tablet Take 20 mg by mouth daily.      magnesium oxide (MAG-OX) 400 MG tablet Take 400 mg by mouth daily.     metoprolol succinate (TOPROL-XL) 25 MG 24 hr tablet Take 25 mg by mouth daily.      mirabegron ER (MYRBETRIQ) 25 MG TB24 tablet Myrbetriq 25 mg tablet,extended release     moxifloxacin (VIGAMOX) 0.5 % ophthalmic solution moxifloxacin 0.5 % eye drops     Multiple Vitamins-Minerals (MULTIVITAMINS THER. W/MINERALS) TABS Take 1 tablet by mouth daily.     nystatin ointment (MYCOSTATIN) nystatin 100,000 unit/gram topical ointment     nystatin-triamcinolone ointment (MYCOLOG) Apply 1 application topically daily as needed (itching).      Polyethyl Glycol-Propyl Glycol 0.4-0.3 % SOLN Apply 2 drops to eye 2 (two) times daily as needed (for dry eyes).     pregabalin (LYRICA) 50 MG capsule      triamcinolone ointment (KENALOG) 0.1 % Apply 1 application topically 2 (two) times daily.     No current facility-administered medications for this visit.    PHYSICAL EXAMINATION: ECOG PERFORMANCE STATUS: 1 - Symptomatic but  completely ambulatory  Vitals:   12/05/20 1200  BP: 137/66  Pulse: 68  Resp: 18  Temp: 98.1 F (36.7 C)  SpO2: 97%   Filed Weights   12/05/20 1200  Weight: 175 lb 4 oz (79.5 kg)    GENERAL:alert, no distress and comfortable SKIN: skin color, texture, turgor are normal, no rashes or significant lesions EYES: normal, Conjunctiva are pink and non-injected, sclera clear OROPHARYNX:no exudate, no erythema and lips, buccal mucosa, and tongue normal  NECK: supple, thyroid normal size, non-tender, without nodularity LYMPH:  no palpable lymphadenopathy in the cervical, axillary or inguinal.  Palpable fluctuant subcutaneous cyst on the left supraclavicular region LUNGS: clear to auscultation and percussion with normal breathing effort HEART: regular rate & rhythm and no murmurs and no lower extremity edema ABDOMEN:abdomen soft, non-tender and normal bowel sounds Musculoskeletal:no  cyanosis of digits and no clubbing  NEURO: alert & oriented x 3 with fluent speech, no focal motor/sensory deficits Well-healed  lumpectomy scar on the right breast  LABORATORY DATA:  I have reviewed the data as listed    Component Value Date/Time   NA 136 05/19/2018 1551   NA 135 (L) 07/07/2016 1035   K 4.0 05/19/2018 1551   K 4.8 07/07/2016 1035   CL 105 05/19/2018 1551   CL 106 07/11/2012 0906   CO2 21 (L) 05/19/2018 1551   CO2 24 07/07/2016 1035   GLUCOSE 89 05/19/2018 1551   GLUCOSE 85 07/07/2016 1035   GLUCOSE 107 (H) 07/11/2012 0906   BUN 13 05/19/2018 1551   BUN 19.9 07/07/2016 1035   CREATININE 0.62 05/19/2018 1551   CREATININE 0.7 07/07/2016 1035   CALCIUM 9.6 05/19/2018 1551   CALCIUM 10.2 07/07/2016 1035   PROT 6.9 07/08/2017 1053   PROT 6.6 07/07/2016 1035   ALBUMIN 3.9 07/08/2017 1053   ALBUMIN 3.9 07/07/2016 1035   AST 14 07/08/2017 1053   AST 14 07/07/2016 1035   ALT 19 07/08/2017 1053   ALT 17 07/07/2016 1035   ALKPHOS 60 07/08/2017 1053   ALKPHOS 58 07/07/2016 1035    BILITOT 0.7 07/08/2017 1053   BILITOT 0.61 07/07/2016 1035   GFRNONAA >60 05/19/2018 1551   GFRAA >60 05/19/2018 1551    No results found for: SPEP, UPEP  Lab Results  Component Value Date   WBC 7.6 12/05/2020   NEUTROABS 5.4 12/05/2020   HGB 10.7 (L) 12/05/2020   HCT 33.1 (L) 12/05/2020   MCV 84.2 12/05/2020   PLT 119 (L) 12/05/2020      Chemistry      Component Value Date/Time   NA 136 05/19/2018 1551   NA 135 (L) 07/07/2016 1035   K 4.0 05/19/2018 1551   K 4.8 07/07/2016 1035   CL 105 05/19/2018 1551   CL 106 07/11/2012 0906   CO2 21 (L) 05/19/2018 1551   CO2 24 07/07/2016 1035   BUN 13 05/19/2018 1551   BUN 19.9 07/07/2016 1035   CREATININE 0.62 05/19/2018 1551   CREATININE 0.7 07/07/2016 1035      Component Value Date/Time   CALCIUM 9.6 05/19/2018 1551   CALCIUM 10.2 07/07/2016 1035   ALKPHOS 60 07/08/2017 1053   ALKPHOS 58 07/07/2016 1035   AST 14 07/08/2017 1053   AST 14 07/07/2016 1035   ALT 19 07/08/2017 1053   ALT 17 07/07/2016 1035   BILITOT 0.7 07/08/2017 1053   BILITOT 0.61 07/07/2016 1035       RADIOGRAPHIC STUDIES: I have personally reviewed the radiological images as listed and agreed with the findings in the report. MM 3D SCREEN BREAST BILATERAL  Result Date: 11/27/2020 CLINICAL DATA:  Screening. EXAM: DIGITAL SCREENING BILATERAL MAMMOGRAM WITH TOMOSYNTHESIS AND CAD TECHNIQUE: Bilateral screening digital craniocaudal and mediolateral oblique mammograms were obtained. Bilateral screening digital breast tomosynthesis was performed. The images were evaluated with computer-aided detection. COMPARISON:  Previous exam(s). ACR Breast Density Category b: There are scattered areas of fibroglandular density. FINDINGS: There are no findings suspicious for malignancy. IMPRESSION: No mammographic evidence of malignancy. A result letter of this screening mammogram will be mailed directly to the patient. RECOMMENDATION: Screening mammogram in one year.  (Code:SM-B-01Y) BI-RADS CATEGORY  1: Negative. Electronically Signed   By: Dorise Bullion III M.D   On: 11/27/2020 16:44

## 2020-12-05 NOTE — Assessment & Plan Note (Signed)
She will continue close observation with urologist

## 2020-12-19 DIAGNOSIS — Z0000 Encounter for general adult medical examination without abnormal findings: Secondary | ICD-10-CM | POA: Diagnosis not present

## 2020-12-19 DIAGNOSIS — E039 Hypothyroidism, unspecified: Secondary | ICD-10-CM | POA: Diagnosis not present

## 2020-12-19 DIAGNOSIS — E559 Vitamin D deficiency, unspecified: Secondary | ICD-10-CM | POA: Diagnosis not present

## 2020-12-19 DIAGNOSIS — E785 Hyperlipidemia, unspecified: Secondary | ICD-10-CM | POA: Diagnosis not present

## 2020-12-20 ENCOUNTER — Ambulatory Visit (INDEPENDENT_AMBULATORY_CARE_PROVIDER_SITE_OTHER): Payer: Medicare Other | Admitting: Podiatry

## 2020-12-20 ENCOUNTER — Other Ambulatory Visit: Payer: Self-pay

## 2020-12-20 DIAGNOSIS — F329 Major depressive disorder, single episode, unspecified: Secondary | ICD-10-CM | POA: Insufficient documentation

## 2020-12-20 DIAGNOSIS — M19019 Primary osteoarthritis, unspecified shoulder: Secondary | ICD-10-CM | POA: Insufficient documentation

## 2020-12-20 DIAGNOSIS — B351 Tinea unguium: Secondary | ICD-10-CM

## 2020-12-20 DIAGNOSIS — Z780 Asymptomatic menopausal state: Secondary | ICD-10-CM | POA: Insufficient documentation

## 2020-12-20 DIAGNOSIS — R269 Unspecified abnormalities of gait and mobility: Secondary | ICD-10-CM | POA: Insufficient documentation

## 2020-12-20 DIAGNOSIS — E559 Vitamin D deficiency, unspecified: Secondary | ICD-10-CM | POA: Insufficient documentation

## 2020-12-20 DIAGNOSIS — K219 Gastro-esophageal reflux disease without esophagitis: Secondary | ICD-10-CM | POA: Insufficient documentation

## 2020-12-20 DIAGNOSIS — M79676 Pain in unspecified toe(s): Secondary | ICD-10-CM | POA: Diagnosis not present

## 2020-12-20 DIAGNOSIS — G8929 Other chronic pain: Secondary | ICD-10-CM | POA: Insufficient documentation

## 2020-12-20 DIAGNOSIS — G629 Polyneuropathy, unspecified: Secondary | ICD-10-CM | POA: Insufficient documentation

## 2020-12-20 DIAGNOSIS — J449 Chronic obstructive pulmonary disease, unspecified: Secondary | ICD-10-CM | POA: Insufficient documentation

## 2020-12-20 DIAGNOSIS — I700 Atherosclerosis of aorta: Secondary | ICD-10-CM | POA: Insufficient documentation

## 2020-12-20 DIAGNOSIS — Z720 Tobacco use: Secondary | ICD-10-CM | POA: Insufficient documentation

## 2020-12-20 DIAGNOSIS — I10 Essential (primary) hypertension: Secondary | ICD-10-CM | POA: Insufficient documentation

## 2020-12-20 DIAGNOSIS — D696 Thrombocytopenia, unspecified: Secondary | ICD-10-CM | POA: Insufficient documentation

## 2020-12-20 DIAGNOSIS — C8591 Non-Hodgkin lymphoma, unspecified, lymph nodes of head, face, and neck: Secondary | ICD-10-CM | POA: Insufficient documentation

## 2020-12-20 DIAGNOSIS — E7800 Pure hypercholesterolemia, unspecified: Secondary | ICD-10-CM | POA: Insufficient documentation

## 2020-12-20 DIAGNOSIS — Z9181 History of falling: Secondary | ICD-10-CM | POA: Insufficient documentation

## 2020-12-22 ENCOUNTER — Encounter: Payer: Self-pay | Admitting: Podiatry

## 2020-12-22 NOTE — Progress Notes (Signed)
  Subjective:  Patient ID: Darlene [name], female    DOB: [birth year],  MRN: [id number]  Darlene [name] presents to clinic today for painful thick toenails that are difficult to trim. Pain interferes with ambulation. Aggravating factors include wearing enclosed shoe gear. Pain is relieved with periodic professional debridement.  She relates no new pedal problems on today's visit.  She is a long term patient of Dr. Burnell Blanks.  Allergies  Allergen Reactions   Oxycodone Other (See Comments)    Made her depressed   Sulfamethoxazole-Trimethoprim     Other reaction(s): Unknown   Codeine Itching            Doxycycline Swelling and Other (See Comments)    Burns throat also   Meperidine And Related Itching and Rash    Makes cheeks red also   Sulfa Antibiotics Itching   Sulfacetamide Sodium Itching    Review of Systems: Negative except as noted in the HPI. Objective:   Constitutional Darlene [name] is a pleasant 86 y.o. Caucasian female, in NAD. AAO x 3.   Vascular Capillary refill time to digits immediate b/l. Palpable pedal pulses b/l LE. Pedal hair sparse. Lower extremity skin temperature gradient within normal limits. No pain with calf compression b/l. No edema noted b/l lower extremities. No cyanosis or clubbing noted.  Neurologic Normal speech. Oriented to person, place, and time. Protective sensation intact 5/5 intact bilaterally with 10g monofilament b/l.  Dermatologic Skin warm and supple b/l lower extremities. No open wounds b/l lower extremities. No interdigital macerations b/l lower extremities. Toenails 1-5 b/l elongated, discolored, dystrophic, thickened, crumbly with subungual debris and tenderness to dorsal palpation.  Orthopedic: Normal muscle strength 5/5 to all lower extremity muscle groups bilaterally. Hallux varus deformity right foot; hallux malleus right foot. Hammertoe(s) noted to the R 2nd toe and R 3rd toe.   Radiographs: None Assessment:   1. Pain due to  onychomycosis of toenail    Plan:  -Examined patient. -Patient to continue soft, supportive shoe gear daily. -Toenails 1-5 b/l were debrided in length and girth with sterile nail nippers and dremel without iatrogenic bleeding.  -Patient to report any pedal injuries to medical professional immediately. -Patient/POA to call should there be question/concern in the interim.  Return in about 3 months (around 03/22/2021) for nail trim with Dr. Prudence Davidson.  Marzetta Board, DPM

## 2020-12-24 DIAGNOSIS — I4510 Unspecified right bundle-branch block: Secondary | ICD-10-CM | POA: Diagnosis not present

## 2020-12-24 DIAGNOSIS — E039 Hypothyroidism, unspecified: Secondary | ICD-10-CM | POA: Diagnosis not present

## 2020-12-24 DIAGNOSIS — Z0000 Encounter for general adult medical examination without abnormal findings: Secondary | ICD-10-CM | POA: Diagnosis not present

## 2020-12-24 DIAGNOSIS — K219 Gastro-esophageal reflux disease without esophagitis: Secondary | ICD-10-CM | POA: Diagnosis not present

## 2020-12-24 DIAGNOSIS — F339 Major depressive disorder, recurrent, unspecified: Secondary | ICD-10-CM | POA: Diagnosis not present

## 2020-12-24 DIAGNOSIS — Z862 Personal history of diseases of the blood and blood-forming organs and certain disorders involving the immune mechanism: Secondary | ICD-10-CM | POA: Diagnosis not present

## 2020-12-24 DIAGNOSIS — E785 Hyperlipidemia, unspecified: Secondary | ICD-10-CM | POA: Diagnosis not present

## 2020-12-24 DIAGNOSIS — I10 Essential (primary) hypertension: Secondary | ICD-10-CM | POA: Diagnosis not present

## 2020-12-24 DIAGNOSIS — E559 Vitamin D deficiency, unspecified: Secondary | ICD-10-CM | POA: Diagnosis not present

## 2020-12-24 DIAGNOSIS — D696 Thrombocytopenia, unspecified: Secondary | ICD-10-CM | POA: Diagnosis not present

## 2020-12-24 DIAGNOSIS — M810 Age-related osteoporosis without current pathological fracture: Secondary | ICD-10-CM | POA: Diagnosis not present

## 2021-01-10 DIAGNOSIS — Z23 Encounter for immunization: Secondary | ICD-10-CM | POA: Diagnosis not present

## 2021-02-03 DIAGNOSIS — H6042 Cholesteatoma of left external ear: Secondary | ICD-10-CM | POA: Diagnosis not present

## 2021-02-03 DIAGNOSIS — H938X3 Other specified disorders of ear, bilateral: Secondary | ICD-10-CM | POA: Diagnosis not present

## 2021-03-03 DIAGNOSIS — C672 Malignant neoplasm of lateral wall of bladder: Secondary | ICD-10-CM | POA: Diagnosis not present

## 2021-03-03 DIAGNOSIS — N3941 Urge incontinence: Secondary | ICD-10-CM | POA: Diagnosis not present

## 2021-03-04 ENCOUNTER — Encounter: Payer: Self-pay | Admitting: Cardiovascular Disease

## 2021-03-04 ENCOUNTER — Other Ambulatory Visit: Payer: Self-pay

## 2021-03-04 ENCOUNTER — Ambulatory Visit (INDEPENDENT_AMBULATORY_CARE_PROVIDER_SITE_OTHER): Payer: Medicare Other | Admitting: Cardiovascular Disease

## 2021-03-04 VITALS — BP 117/71 | HR 68 | Ht 62.0 in | Wt 173.6 lb

## 2021-03-04 DIAGNOSIS — I10 Essential (primary) hypertension: Secondary | ICD-10-CM

## 2021-03-04 DIAGNOSIS — I4510 Unspecified right bundle-branch block: Secondary | ICD-10-CM

## 2021-03-04 DIAGNOSIS — R9431 Abnormal electrocardiogram [ECG] [EKG]: Secondary | ICD-10-CM | POA: Diagnosis not present

## 2021-03-04 DIAGNOSIS — E785 Hyperlipidemia, unspecified: Secondary | ICD-10-CM

## 2021-03-04 NOTE — Patient Instructions (Signed)

## 2021-03-04 NOTE — Assessment & Plan Note (Signed)
[name] was referred to me by Dr. Shelia Media for new newly recognized right bundle branch block.  Her EKG shows sinus rhythm today with LVH voltage but no bundle branch block.  I do not think this requires further work-up.

## 2021-03-04 NOTE — Progress Notes (Signed)
03/04/2021 Darlene [name]   07/21/1934  [id number]  Primary Physician Darlene [name], [place] Primary Cardiologist: Darlene Harp MD FACP, Darlene [name], Darlene [name], Darlene [name]  HPI:  Darlene [name] is a 86 y.o. moderately overweight widowed Caucasian female, mother of 2, grandmother of 1 whose husband Darlene [name] was also a patient of mine and passed away back in 2012/07/24.  With a past medical history significant for breast cancer and non Hodgkin's Lymphoma. She has no history of CAD or prior cardiac evaluation. She has treated dyslipidemia, and a family history of CAD. She was admitted with Darlene pain that awakened her from sleep. She describes SSCP-"pressure", "like a truck on my Darlene". EKG and Troponin are negative but she still has some Darlene discomfort.she underwent diagnostic coronary arteriography with Dr. Ellouise [name] done femorally revealing minimal CAD and normal LV function.   Since I saw her 8 years ago she continues to do well.  She denies Darlene pain or shortness of breath.  She did see her primary care physician, Dr. Shelia [name], in the office who noticed a new right bundle branch block and referred her for further evaluation.  Today in the office she is in sinus rhythm with narrow complex QRS and no evidence of a conduction abnormality.  Current Meds  Medication Sig   acetaminophen (TYLENOL) 500 MG tablet Take 1 tablet (500 mg total) by mouth every 6 (six) hours as needed for mild pain. (Patient taking differently: Take 1,000 mg by mouth every 8 (eight) hours as needed for mild pain.)   alclomethasone (ACLOVATE) 0.05 % cream SMARTSIG:1 Topical Every Night   amoxicillin (AMOXIL) 500 MG capsule Take 2,000 mg by mouth once as needed (One hour prior to dental appointment).    aspirin 81 MG tablet Take 81 mg by mouth daily.   augmented betamethasone dipropionate (DIPROLENE-AF) 0.05 % ointment Apply 1 application topically daily as needed (itching).    Calcium Carbonate-Vit D-Min (CALTRATE 600+D PLUS MINERALS)  600-800 MG-UNIT CHEW Chew 2 each by mouth 2 (two) times daily.   cholecalciferol (VITAMIN D) 1000 UNITS tablet Take 1,000 Units by mouth daily.   citalopram (CELEXA) 40 MG tablet Take 40 mg by mouth daily.    Cyanocobalamin (VITAMIN B-12 PO) Take 1,000 mcg by mouth daily.   diazepam (VALIUM) 5 MG tablet Take 1 tablet (5 mg total) by mouth every 6 (six) hours as needed for anxiety (Take 30-min prior MRI).   diclofenac Sodium (VOLTAREN) 1 % GEL diclofenac 1 % topical gel   Difluprednate 0.05 % EMUL Place 2 drops into the right eye 2 (two) times daily.    ergocalciferol (VITAMIN D2) 1.25 MG (50000 UT) capsule    esomeprazole (NEXIUM) 40 MG capsule Take 40 mg by mouth 2 (two) times daily before a meal.   famciclovir (FAMVIR) 125 MG tablet    L-Methylfolate-Algae-B12-B6 (METANX) 3-90.314-2-35 MG CAPS    levothyroxine (SYNTHROID, LEVOTHROID) 75 MCG tablet Take 75 mcg by mouth daily before breakfast.    lovastatin (MEVACOR) 20 MG tablet Take 20 mg by mouth daily.    magnesium oxide (MAG-OX) 400 MG tablet Take 400 mg by mouth daily.   metoprolol succinate (TOPROL-XL) 25 MG 24 hr tablet Take 25 mg by mouth daily.    mirabegron ER (MYRBETRIQ) 25 MG TB24 tablet Myrbetriq 25 mg tablet,extended release   moxifloxacin (VIGAMOX) 0.5 % ophthalmic solution moxifloxacin 0.5 % eye drops   Multiple Vitamins-Minerals (MULTIVITAMINS THER. W/MINERALS) TABS Take 1 tablet by mouth daily.  nystatin ointment (MYCOSTATIN) nystatin 100,000 unit/gram topical ointment   nystatin-triamcinolone ointment (MYCOLOG) Apply 1 application topically daily as needed (itching).    Polyethyl Glycol-Propyl Glycol 0.4-0.3 % SOLN Apply 2 drops to eye 2 (two) times daily as needed (for dry eyes).   pregabalin (LYRICA) 50 MG capsule    triamcinolone ointment (KENALOG) 0.1 % Apply 1 application topically 2 (two) times daily.     Allergies  Allergen Reactions   Oxycodone Other (See Comments)    Made her depressed    Sulfamethoxazole-Trimethoprim     Other reaction(s): Unknown   Codeine Itching            Doxycycline Swelling and Other (See Comments)    Burns throat also   Meperidine And Related Itching and Rash    Makes cheeks red also   Sulfa Antibiotics Itching   Sulfacetamide Sodium Itching    Social History   Socioeconomic History   Marital status: Widowed    Spouse name: Not on file   Number of children: 2   Years of education: HS   Highest education level: Not on file  Occupational History   Occupation: Retired  Tobacco Use   Smoking status: Former    Packs/day: 0.30    Types: Cigarettes   Smokeless tobacco: Never   Tobacco comments:    quit smoking 1985  was a casual smoker  Vaping Use   Vaping Use: Never used  Substance and Sexual Activity   Alcohol use: No    Alcohol/week: 0.0 standard drinks   Drug use: No   Sexual activity: Not Currently    Birth control/protection: Post-menopausal  Other Topics Concern   Not on file  Social History Narrative   Lives at home alone.   Right-handed.   4 cups of caffeine daily.   Social Determinants of Health   Financial Resource Strain: Not on file  Food Insecurity: Not on file  Transportation Needs: Not on file  Physical Activity: Not on file  Stress: Not on file  Social Connections: Not on file  Intimate Partner Violence: Not on file     Review of Systems: General: negative for chills, fever, night sweats or weight changes.  Cardiovascular: negative for Darlene pain, dyspnea on exertion, edema, orthopnea, palpitations, paroxysmal nocturnal dyspnea or shortness of breath Dermatological: negative for rash Respiratory: negative for cough or wheezing Urologic: negative for hematuria Abdominal: negative for nausea, vomiting, diarrhea, bright red blood per rectum, melena, or hematemesis Neurologic: negative for visual changes, syncope, or dizziness All other systems reviewed and are otherwise negative except as noted  above.    Blood pressure 117/71, pulse 68, height 5\' 2"  (1.575 m), weight 173 lb 9.6 oz (78.7 kg), SpO2 94 %.  General appearance: alert and no distress Neck: no adenopathy, no carotid bruit, no JVD, supple, symmetrical, trachea midline, and thyroid not enlarged, symmetric, no tenderness/mass/nodules Lungs: clear to auscultation bilaterally Heart: regular rate and rhythm, S1, S2 normal, no murmur, click, rub or gallop Extremities: extremities normal, atraumatic, no cyanosis or edema Pulses: 2+ and symmetric Skin: Skin color, texture, turgor normal. No rashes or lesions Neurologic: Grossly normal  EKG sinus rhythm at 68 with voltage criteria for LVH.  I personally reviewed this EKG.  ASSESSMENT AND PLAN:   Dyslipidemia History of dyslipidemia on statin therapy with lipid profile performed 12/19/2020 revealing total cholesterol 199, LDL 99 HDL of 95.  Essential hypertension History of essential hypertension on metoprolol with blood pressure measured today 117/71.  Right bundle branch  block Ms. Riesgo was referred to me by Dr. Shelia [name] for new newly recognized right bundle branch block.  Her EKG shows sinus rhythm today with LVH voltage but no bundle branch block.  I do not think this requires further work-up.     Darlene Harp MD FACP,FACC,FAHA, [facility] 03/04/2021 1:44 PM

## 2021-03-04 NOTE — Assessment & Plan Note (Signed)
History of dyslipidemia on statin therapy with lipid profile performed 12/19/2020 revealing total cholesterol 199, LDL 99 HDL of 95.

## 2021-03-04 NOTE — Assessment & Plan Note (Signed)
History of essential hypertension on metoprolol with blood pressure measured today 117/71.

## 2021-03-25 ENCOUNTER — Other Ambulatory Visit: Payer: Self-pay

## 2021-03-25 ENCOUNTER — Ambulatory Visit (INDEPENDENT_AMBULATORY_CARE_PROVIDER_SITE_OTHER): Payer: Medicare Other | Admitting: Podiatry

## 2021-03-25 DIAGNOSIS — B351 Tinea unguium: Secondary | ICD-10-CM | POA: Diagnosis not present

## 2021-03-25 DIAGNOSIS — D696 Thrombocytopenia, unspecified: Secondary | ICD-10-CM

## 2021-03-25 DIAGNOSIS — M79676 Pain in unspecified toe(s): Secondary | ICD-10-CM | POA: Diagnosis not present

## 2021-03-25 NOTE — Progress Notes (Signed)
This patient returns to the office for evaluation and treatment of long thick painful nails .  This patient is unable to trim her own nails since the patient cannot reach her feet.  Patient says the nails are painful walking and wearing his shoes.  She returns for preventive foot care services.  General Appearance  Alert, conversant and in no acute stress.  Vascular  Dorsalis pedis and posterior tibial  pulses are palpable  bilaterally.  Capillary return is within normal limits  bilaterally. Temperature is within normal limits  bilaterally.  Neurologic  Senn-Weinstein monofilament wire test within normal limits  bilaterally. Muscle power within normal limits bilaterally.  Nails Thick disfigured discolored nails with subungual debris  from hallux to fifth toes bilaterally. No evidence of bacterial infection or drainage bilaterally.  Orthopedic  No limitations of motion  feet .  No crepitus or effusions noted.  No bony pathology or digital deformities noted.  Hallux varus 1st MPJ and hallux malleus right foot.   Hammer toes 2,3 right.  Skin  normotropic skin with no porokeratosis noted bilaterally.  No signs of infections or ulcers noted.     Onychomycosis  Pain in toes right foot  Pain in toes left foot  Debridement  of nails  1-5  B/L with a nail nipper.  Nails were then filed using a dremel tool with no incidents.    RTC 3 months    Shawntrice Salle DPM  

## 2021-04-01 ENCOUNTER — Telehealth: Payer: Self-pay | Admitting: *Deleted

## 2021-04-01 NOTE — Telephone Encounter (Signed)
Patient called. She wants to know if she needs to make appt for mammogram in 2023. She had one in July of 2022, but her other doctor, her primary doctor, wants to know if she needs one next year.  Call routed to Dr. Alvy Bimler.

## 2021-04-01 NOTE — Telephone Encounter (Signed)
RETURNED PATIENT'S PHONE CALL, SPOKE WITH PATIENT. ?

## 2021-04-02 ENCOUNTER — Telehealth: Payer: Self-pay | Admitting: *Deleted

## 2021-04-02 NOTE — Telephone Encounter (Signed)
Pls call her, at her age, she can stop screening mammogram If she insist of having one, she can just proceed with scheduling directly with imaging center, she does not need a referral

## 2021-04-02 NOTE — Telephone Encounter (Signed)
Per Dr.Gorsuch, called pt to advise that she does not need screening mm at her age but if she insist that she wants one she can schedule directly with the imaging center with no referral needed. Advised to call if she has any other concerns

## 2021-05-08 DIAGNOSIS — M79641 Pain in right hand: Secondary | ICD-10-CM | POA: Diagnosis not present

## 2021-05-08 DIAGNOSIS — S60221A Contusion of right hand, initial encounter: Secondary | ICD-10-CM | POA: Diagnosis not present

## 2021-05-23 DIAGNOSIS — Z01411 Encounter for gynecological examination (general) (routine) with abnormal findings: Secondary | ICD-10-CM | POA: Diagnosis not present

## 2021-05-23 DIAGNOSIS — B001 Herpesviral vesicular dermatitis: Secondary | ICD-10-CM | POA: Diagnosis not present

## 2021-05-23 DIAGNOSIS — B372 Candidiasis of skin and nail: Secondary | ICD-10-CM | POA: Diagnosis not present

## 2021-05-23 DIAGNOSIS — B009 Herpesviral infection, unspecified: Secondary | ICD-10-CM | POA: Diagnosis not present

## 2021-05-23 DIAGNOSIS — Z01419 Encounter for gynecological examination (general) (routine) without abnormal findings: Secondary | ICD-10-CM | POA: Diagnosis not present

## 2021-05-23 DIAGNOSIS — Z6836 Body mass index (BMI) 36.0-36.9, adult: Secondary | ICD-10-CM | POA: Diagnosis not present

## 2021-05-23 DIAGNOSIS — Z124 Encounter for screening for malignant neoplasm of cervix: Secondary | ICD-10-CM | POA: Diagnosis not present

## 2021-06-24 DIAGNOSIS — E785 Hyperlipidemia, unspecified: Secondary | ICD-10-CM | POA: Diagnosis not present

## 2021-06-30 ENCOUNTER — Ambulatory Visit (INDEPENDENT_AMBULATORY_CARE_PROVIDER_SITE_OTHER): Payer: Medicare Other | Admitting: Podiatry

## 2021-06-30 ENCOUNTER — Other Ambulatory Visit: Payer: Self-pay

## 2021-06-30 ENCOUNTER — Encounter: Payer: Self-pay | Admitting: Podiatry

## 2021-06-30 DIAGNOSIS — M79676 Pain in unspecified toe(s): Secondary | ICD-10-CM | POA: Diagnosis not present

## 2021-06-30 DIAGNOSIS — B351 Tinea unguium: Secondary | ICD-10-CM | POA: Diagnosis not present

## 2021-06-30 DIAGNOSIS — D696 Thrombocytopenia, unspecified: Secondary | ICD-10-CM | POA: Diagnosis not present

## 2021-06-30 NOTE — Progress Notes (Signed)
This patient returns to the office for evaluation and treatment of long thick painful nails .  This patient is unable to trim her own nails since the patient cannot reach her feet.  Patient says the nails are painful walking and wearing his shoes.  She returns for preventive foot care services.  General Appearance  Alert, conversant and in no acute stress.  Vascular  Dorsalis pedis and posterior tibial  pulses are palpable  bilaterally.  Capillary return is within normal limits  bilaterally. Temperature is within normal limits  bilaterally.  Neurologic  Senn-Weinstein monofilament wire test within normal limits  bilaterally. Muscle power within normal limits bilaterally.  Nails Thick disfigured discolored nails with subungual debris  from hallux to fifth toes bilaterally. No evidence of bacterial infection or drainage bilaterally.  Orthopedic  No limitations of motion  feet .  No crepitus or effusions noted.  No bony pathology or digital deformities noted.  Hallux varus 1st MPJ and hallux malleus right foot.   Hammer toes 2,3 right.  Skin  normotropic skin with no porokeratosis noted bilaterally.  No signs of infections or ulcers noted.     Onychomycosis  Pain in toes right foot  Pain in toes left foot  Debridement  of nails  1-5  B/L with a nail nipper.  Nails were then filed using a dremel tool with no incidents.    RTC 3 months    Areej Tayler DPM  

## 2021-07-01 DIAGNOSIS — E559 Vitamin D deficiency, unspecified: Secondary | ICD-10-CM | POA: Diagnosis not present

## 2021-07-01 DIAGNOSIS — C8591 Non-Hodgkin lymphoma, unspecified, lymph nodes of head, face, and neck: Secondary | ICD-10-CM | POA: Diagnosis not present

## 2021-07-01 DIAGNOSIS — I10 Essential (primary) hypertension: Secondary | ICD-10-CM | POA: Diagnosis not present

## 2021-07-01 DIAGNOSIS — E785 Hyperlipidemia, unspecified: Secondary | ICD-10-CM | POA: Diagnosis not present

## 2021-07-01 DIAGNOSIS — D696 Thrombocytopenia, unspecified: Secondary | ICD-10-CM | POA: Diagnosis not present

## 2021-07-01 DIAGNOSIS — E039 Hypothyroidism, unspecified: Secondary | ICD-10-CM | POA: Diagnosis not present

## 2021-07-02 ENCOUNTER — Other Ambulatory Visit: Payer: Self-pay | Admitting: Hematology and Oncology

## 2021-07-02 DIAGNOSIS — D539 Nutritional anemia, unspecified: Secondary | ICD-10-CM

## 2021-07-03 ENCOUNTER — Encounter: Payer: Self-pay | Admitting: Hematology and Oncology

## 2021-07-03 ENCOUNTER — Inpatient Hospital Stay (HOSPITAL_BASED_OUTPATIENT_CLINIC_OR_DEPARTMENT_OTHER): Payer: Medicare Other | Admitting: Hematology and Oncology

## 2021-07-03 ENCOUNTER — Inpatient Hospital Stay: Payer: Medicare Other | Attending: Hematology and Oncology

## 2021-07-03 ENCOUNTER — Other Ambulatory Visit: Payer: Self-pay

## 2021-07-03 DIAGNOSIS — Z881 Allergy status to other antibiotic agents status: Secondary | ICD-10-CM | POA: Diagnosis not present

## 2021-07-03 DIAGNOSIS — Z8551 Personal history of malignant neoplasm of bladder: Secondary | ICD-10-CM | POA: Insufficient documentation

## 2021-07-03 DIAGNOSIS — Z550 Illiteracy and low-level literacy: Secondary | ICD-10-CM

## 2021-07-03 DIAGNOSIS — Z79899 Other long term (current) drug therapy: Secondary | ICD-10-CM | POA: Diagnosis not present

## 2021-07-03 DIAGNOSIS — D693 Immune thrombocytopenic purpura: Secondary | ICD-10-CM | POA: Diagnosis not present

## 2021-07-03 DIAGNOSIS — D509 Iron deficiency anemia, unspecified: Secondary | ICD-10-CM | POA: Insufficient documentation

## 2021-07-03 DIAGNOSIS — Z882 Allergy status to sulfonamides status: Secondary | ICD-10-CM | POA: Insufficient documentation

## 2021-07-03 DIAGNOSIS — D638 Anemia in other chronic diseases classified elsewhere: Secondary | ICD-10-CM | POA: Diagnosis not present

## 2021-07-03 DIAGNOSIS — K219 Gastro-esophageal reflux disease without esophagitis: Secondary | ICD-10-CM | POA: Diagnosis not present

## 2021-07-03 DIAGNOSIS — Z8572 Personal history of non-Hodgkin lymphomas: Secondary | ICD-10-CM

## 2021-07-03 DIAGNOSIS — Z7952 Long term (current) use of systemic steroids: Secondary | ICD-10-CM | POA: Insufficient documentation

## 2021-07-03 DIAGNOSIS — Z885 Allergy status to narcotic agent status: Secondary | ICD-10-CM | POA: Insufficient documentation

## 2021-07-03 DIAGNOSIS — Z853 Personal history of malignant neoplasm of breast: Secondary | ICD-10-CM | POA: Insufficient documentation

## 2021-07-03 DIAGNOSIS — D539 Nutritional anemia, unspecified: Secondary | ICD-10-CM

## 2021-07-03 LAB — CBC WITH DIFFERENTIAL/PLATELET
Abs Immature Granulocytes: 0.02 10*3/uL (ref 0.00–0.07)
Basophils Absolute: 0.1 10*3/uL (ref 0.0–0.1)
Basophils Relative: 1 %
Eosinophils Absolute: 0.1 10*3/uL (ref 0.0–0.5)
Eosinophils Relative: 2 %
HCT: 31.6 % — ABNORMAL LOW (ref 36.0–46.0)
Hemoglobin: 10.4 g/dL — ABNORMAL LOW (ref 12.0–15.0)
Immature Granulocytes: 0 %
Lymphocytes Relative: 27 %
Lymphs Abs: 1.8 10*3/uL (ref 0.7–4.0)
MCH: 26.1 pg (ref 26.0–34.0)
MCHC: 32.9 g/dL (ref 30.0–36.0)
MCV: 79.2 fL — ABNORMAL LOW (ref 80.0–100.0)
Monocytes Absolute: 0.5 10*3/uL (ref 0.1–1.0)
Monocytes Relative: 7 %
Neutro Abs: 4.4 10*3/uL (ref 1.7–7.7)
Neutrophils Relative %: 63 %
Platelets: 63 10*3/uL — ABNORMAL LOW (ref 150–400)
RBC: 3.99 MIL/uL (ref 3.87–5.11)
RDW: 14.5 % (ref 11.5–15.5)
WBC: 6.9 10*3/uL (ref 4.0–10.5)
nRBC: 0 % (ref 0.0–0.2)

## 2021-07-03 LAB — FERRITIN: Ferritin: 9 ng/mL — ABNORMAL LOW (ref 11–307)

## 2021-07-03 LAB — COMPREHENSIVE METABOLIC PANEL
ALT: 14 U/L (ref 0–44)
AST: 15 U/L (ref 15–41)
Albumin: 4.1 g/dL (ref 3.5–5.0)
Alkaline Phosphatase: 58 U/L (ref 38–126)
Anion gap: 7 (ref 5–15)
BUN: 15 mg/dL (ref 8–23)
CO2: 24 mmol/L (ref 22–32)
Calcium: 10 mg/dL (ref 8.9–10.3)
Chloride: 107 mmol/L (ref 98–111)
Creatinine, Ser: 0.64 mg/dL (ref 0.44–1.00)
GFR, Estimated: >60 mL/min (ref >60)
Glucose, Bld: 81 mg/dL (ref 70–99)
Potassium: 4 mmol/L (ref 3.5–5.1)
Sodium: 138 mmol/L (ref 135–145)
Total Bilirubin: 0.9 mg/dL (ref 0.3–1.2)
Total Protein: 6.6 g/dL (ref 6.5–8.1)

## 2021-07-03 LAB — IRON AND IRON BINDING CAPACITY (CC-WL,HP ONLY)
Iron: 47 ug/dL (ref 28–170)
Saturation Ratios: 10 % — ABNORMAL LOW (ref 10.4–31.8)
TIBC: 487 ug/dL — ABNORMAL HIGH (ref 250–450)
UIBC: 440 ug/dL (ref 148–442)

## 2021-07-03 LAB — VITAMIN B12: Vitamin B-12: 2771 pg/mL — ABNORMAL HIGH (ref 180–914)

## 2021-07-03 LAB — SEDIMENTATION RATE: Sed Rate: 8 mm/hr (ref 0–22)

## 2021-07-03 LAB — LACTATE DEHYDROGENASE: LDH: 166 U/L (ref 98–192)

## 2021-07-03 MED ORDER — PREDNISONE 10 MG PO TABS: 30.0000 mg | ORAL_TABLET | Freq: Every day | ORAL | 0 refills | Status: DC

## 2021-07-03 NOTE — Assessment & Plan Note (Signed)
From my prior exam, I suggest that she might have a lipoma/fatty tumor ?The patient did not understand what that means and has been very upset for the last 6 months ?I tried to explain to the patient that lipoma is a benign condition and does not require long-term imaging study ?

## 2021-07-03 NOTE — Assessment & Plan Note (Signed)
The patient seems to have problems understanding the natural history of lymphoma and ITP ?With a low level of literacy, I recommend she brings her daughter to her next visit so that I can explain to her better ?

## 2021-07-03 NOTE — Assessment & Plan Note (Signed)
She had history of iron deficiency anemia ?I will order iron studies and B12 to evaluate ?

## 2021-07-03 NOTE — Assessment & Plan Note (Signed)
She has signs of recurrent ITP ?I recommend a trial of prednisone therapy ?I plan to see her again next week for further follow-up ?

## 2021-07-03 NOTE — Progress Notes (Signed)
[place] OFFICE PROGRESS NOTE  Patient Care Team: Holland Commons, FNP as PCP - General (Internal Medicine)  ASSESSMENT & PLAN:  History of non-Hodgkin's lymphoma From my prior exam, I suggest that she might have a lipoma/fatty tumor The patient did not understand what that means and has been very upset for the last 6 months I tried to explain to the patient that lipoma is a benign condition and does not require long-term imaging study  Chronic ITP (idiopathic thrombocytopenia) (HCC) She has signs of recurrent ITP I recommend a trial of prednisone therapy I plan to see her again next week for further follow-up  Anemia, chronic disease She had history of iron deficiency anemia I will order iron studies and B12 to evaluate  Low-level of literacy The patient seems to have problems understanding the natural history of lymphoma and ITP With a low level of literacy, I recommend she brings her daughter to her next visit so that I can explain to her better  No orders of the defined types were placed in this encounter.   All questions were answered. The patient knows to call the clinic with any problems, questions or concerns. The total time spent in the appointment was 25 minutes encounter with patients including review of chart and various tests results, discussions about plan of care and coordination of care plan   Heath Lark, MD 07/03/2021 1:25 PM  INTERVAL HISTORY: Please see below for problem oriented charting. she returns for treatment follow-up on history of lymphoma and ITP, as well as history of breast cancer and bladder cancer The patient shared with me that she has been upset with me over the past 6 months She did not understand what fatty tumor means and thought she had recurrent cancer When a question why the patient did not call me back, she did not have a definitive answer for that She denies recent bleeding  REVIEW OF SYSTEMS:   Constitutional:  Denies fevers, chills or abnormal weight loss Eyes: Denies blurriness of vision Ears, nose, mouth, throat, and face: Denies mucositis or sore throat Respiratory: Denies cough, dyspnea or wheezes Cardiovascular: Denies palpitation, chest discomfort or lower extremity swelling Gastrointestinal:  Denies nausea, heartburn or change in bowel habits Skin: Denies abnormal skin rashes Lymphatics: Denies new lymphadenopathy or easy bruising Neurological:Denies numbness, tingling or new weaknesses Behavioral/Psych: Mood is stable, no new changes  All other systems were reviewed with the patient and are negative.  I have reviewed the past medical history, past surgical history, social history and family history with the patient and they are unchanged from previous note.  ALLERGIES:  is allergic to oxycodone, sulfamethoxazole-trimethoprim, codeine, doxycycline, meperidine and related, sulfa antibiotics, and sulfacetamide sodium.  MEDICATIONS:  Current Outpatient Medications  Medication Sig Dispense Refill   predniSONE (DELTASONE) 10 MG tablet Take 3 tablets (30 mg total) by mouth daily with breakfast. 90 tablet 0   acetaminophen (TYLENOL) 500 MG tablet Take 1 tablet (500 mg total) by mouth every 6 (six) hours as needed for mild pain. (Patient taking differently: Take 1,000 mg by mouth every 8 (eight) hours as needed for mild pain.) 30 tablet 0   alclomethasone (ACLOVATE) 0.05 % cream SMARTSIG:1 Topical Every Night     amoxicillin (AMOXIL) 500 MG capsule Take 2,000 mg by mouth once as needed (One hour prior to dental appointment).      aspirin 81 MG tablet Take 81 mg by mouth daily.     augmented betamethasone dipropionate (DIPROLENE-AF) 0.05 %  ointment Apply 1 application topically daily as needed (itching).      Calcium Carbonate-Vit D-Min (CALTRATE 600+D PLUS MINERALS) 600-800 MG-UNIT CHEW Chew 2 each by mouth 2 (two) times daily.     cholecalciferol (VITAMIN D) 1000 UNITS tablet Take 1,000 Units by  mouth daily.     citalopram (CELEXA) 40 MG tablet Take 40 mg by mouth daily.      Cyanocobalamin (VITAMIN B-12 PO) Take 1,000 mcg by mouth daily.     diazepam (VALIUM) 5 MG tablet Take 1 tablet (5 mg total) by mouth every 6 (six) hours as needed for anxiety (Take 30-min prior MRI). 1 tablet 0   diclofenac Sodium (VOLTAREN) 1 % GEL diclofenac 1 % topical gel     Difluprednate 0.05 % EMUL Place 2 drops into the right eye 2 (two) times daily.      ergocalciferol (VITAMIN D2) 1.25 MG (50000 UT) capsule      esomeprazole (NEXIUM) 40 MG capsule Take 40 mg by mouth 2 (two) times daily before a meal.     famciclovir (FAMVIR) 125 MG tablet      L-Methylfolate-Algae-B12-B6 (METANX) 3-90.314-2-35 MG CAPS      levothyroxine (SYNTHROID, LEVOTHROID) 75 MCG tablet Take 75 mcg by mouth daily before breakfast.      lovastatin (MEVACOR) 20 MG tablet Take 20 mg by mouth daily.      magnesium oxide (MAG-OX) 400 MG tablet Take 400 mg by mouth daily.     metoprolol succinate (TOPROL-XL) 25 MG 24 hr tablet Take 25 mg by mouth daily.      mirabegron ER (MYRBETRIQ) 25 MG TB24 tablet Myrbetriq 25 mg tablet,extended release     moxifloxacin (VIGAMOX) 0.5 % ophthalmic solution moxifloxacin 0.5 % eye drops     Multiple Vitamins-Minerals (MULTIVITAMINS THER. W/MINERALS) TABS Take 1 tablet by mouth daily.     nystatin ointment (MYCOSTATIN) nystatin 100,000 unit/gram topical ointment     nystatin-triamcinolone ointment (MYCOLOG) Apply 1 application topically daily as needed (itching).      Polyethyl Glycol-Propyl Glycol 0.4-0.3 % SOLN Apply 2 drops to eye 2 (two) times daily as needed (for dry eyes).     pregabalin (LYRICA) 50 MG capsule      pregabalin (LYRICA) 50 MG capsule Take 1 capsule by mouth 2 (two) times daily.     triamcinolone ointment (KENALOG) 0.1 % Apply 1 application topically 2 (two) times daily.     No current facility-administered medications for this visit.    SUMMARY OF ONCOLOGIC HISTORY: Oncology  History   No history exists.    PHYSICAL EXAMINATION: ECOG PERFORMANCE STATUS: 2 - Symptomatic, <50% confined to bed  Vitals:   07/03/21 1228  BP: (!) 128/55  Pulse: 69  Resp: 18  SpO2: 96%   Filed Weights   07/03/21 1228  Weight: 173 lb 12.8 oz (78.8 kg)    GENERAL:alert, no distress and comfortable NEURO: alert & oriented x 3 with fluent speech, no focal motor/sensory deficits  LABORATORY DATA:  I have reviewed the data as listed    Component Value Date/Time   NA 138 07/03/2021 1153   NA 135 (L) 07/07/2016 1035   K 4.0 07/03/2021 1153   K 4.8 07/07/2016 1035   CL 107 07/03/2021 1153   CL 106 07/11/2012 0906   CO2 24 07/03/2021 1153   CO2 24 07/07/2016 1035   GLUCOSE 81 07/03/2021 1153   GLUCOSE 85 07/07/2016 1035   GLUCOSE 107 (H) 07/11/2012 0906   BUN 15 07/03/2021  1153   BUN 19.9 07/07/2016 1035   CREATININE 0.64 07/03/2021 1153   CREATININE 0.7 07/07/2016 1035   CALCIUM 10.0 07/03/2021 1153   CALCIUM 10.2 07/07/2016 1035   PROT 6.6 07/03/2021 1153   PROT 6.6 07/07/2016 1035   ALBUMIN 4.1 07/03/2021 1153   ALBUMIN 3.9 07/07/2016 1035   AST 15 07/03/2021 1153   AST 14 07/07/2016 1035   ALT 14 07/03/2021 1153   ALT 17 07/07/2016 1035   ALKPHOS 58 07/03/2021 1153   ALKPHOS 58 07/07/2016 1035   BILITOT 0.9 07/03/2021 1153   BILITOT 0.61 07/07/2016 1035   GFRNONAA >60 07/03/2021 1153   GFRAA >60 05/19/2018 1551    No results found for: SPEP, UPEP  Lab Results  Component Value Date   WBC 6.9 07/03/2021   NEUTROABS 4.4 07/03/2021   HGB 10.4 (L) 07/03/2021   HCT 31.6 (L) 07/03/2021   MCV 79.2 (L) 07/03/2021   PLT 63 (L) 07/03/2021      Chemistry      Component Value Date/Time   NA 138 07/03/2021 1153   NA 135 (L) 07/07/2016 1035   K 4.0 07/03/2021 1153   K 4.8 07/07/2016 1035   CL 107 07/03/2021 1153   CL 106 07/11/2012 0906   CO2 24 07/03/2021 1153   CO2 24 07/07/2016 1035   BUN 15 07/03/2021 1153   BUN 19.9 07/07/2016 1035   CREATININE  0.64 07/03/2021 1153   CREATININE 0.7 07/07/2016 1035      Component Value Date/Time   CALCIUM 10.0 07/03/2021 1153   CALCIUM 10.2 07/07/2016 1035   ALKPHOS 58 07/03/2021 1153   ALKPHOS 58 07/07/2016 1035   AST 15 07/03/2021 1153   AST 14 07/07/2016 1035   ALT 14 07/03/2021 1153   ALT 17 07/07/2016 1035   BILITOT 0.9 07/03/2021 1153   BILITOT 0.61 07/07/2016 1035

## 2021-07-11 ENCOUNTER — Other Ambulatory Visit: Payer: Self-pay

## 2021-07-11 ENCOUNTER — Inpatient Hospital Stay: Payer: Medicare Other

## 2021-07-11 ENCOUNTER — Inpatient Hospital Stay (HOSPITAL_BASED_OUTPATIENT_CLINIC_OR_DEPARTMENT_OTHER): Payer: Medicare Other | Admitting: Hematology and Oncology

## 2021-07-11 ENCOUNTER — Encounter: Payer: Self-pay | Admitting: Hematology and Oncology

## 2021-07-11 DIAGNOSIS — D539 Nutritional anemia, unspecified: Secondary | ICD-10-CM | POA: Diagnosis not present

## 2021-07-11 DIAGNOSIS — D693 Immune thrombocytopenic purpura: Secondary | ICD-10-CM

## 2021-07-11 DIAGNOSIS — Z79899 Other long term (current) drug therapy: Secondary | ICD-10-CM | POA: Diagnosis not present

## 2021-07-11 DIAGNOSIS — Z7952 Long term (current) use of systemic steroids: Secondary | ICD-10-CM | POA: Diagnosis not present

## 2021-07-11 DIAGNOSIS — D638 Anemia in other chronic diseases classified elsewhere: Secondary | ICD-10-CM | POA: Diagnosis not present

## 2021-07-11 DIAGNOSIS — D509 Iron deficiency anemia, unspecified: Secondary | ICD-10-CM | POA: Diagnosis not present

## 2021-07-11 DIAGNOSIS — K219 Gastro-esophageal reflux disease without esophagitis: Secondary | ICD-10-CM | POA: Diagnosis not present

## 2021-07-11 LAB — CBC WITH DIFFERENTIAL/PLATELET
Abs Immature Granulocytes: 0.06 10*3/uL (ref 0.00–0.07)
Basophils Absolute: 0 10*3/uL (ref 0.0–0.1)
Basophils Relative: 0 %
Eosinophils Absolute: 0 10*3/uL (ref 0.0–0.5)
Eosinophils Relative: 0 %
HCT: 34.5 % — ABNORMAL LOW (ref 36.0–46.0)
Hemoglobin: 10.9 g/dL — ABNORMAL LOW (ref 12.0–15.0)
Immature Granulocytes: 1 %
Lymphocytes Relative: 13 %
Lymphs Abs: 1.7 10*3/uL (ref 0.7–4.0)
MCH: 25.3 pg — ABNORMAL LOW (ref 26.0–34.0)
MCHC: 31.6 g/dL (ref 30.0–36.0)
MCV: 80.2 fL (ref 80.0–100.0)
Monocytes Absolute: 0.3 10*3/uL (ref 0.1–1.0)
Monocytes Relative: 2 %
Neutro Abs: 11.1 10*3/uL — ABNORMAL HIGH (ref 1.7–7.7)
Neutrophils Relative %: 84 %
Platelets: 222 10*3/uL (ref 150–400)
RBC: 4.3 MIL/uL (ref 3.87–5.11)
RDW: 14.8 % (ref 11.5–15.5)
WBC: 13.1 10*3/uL — ABNORMAL HIGH (ref 4.0–10.5)
nRBC: 0 % (ref 0.0–0.2)

## 2021-07-11 MED ORDER — PREDNISONE 10 MG PO TABS: 10.0000 mg | ORAL_TABLET | Freq: Every day | ORAL | 0 refills | Status: DC

## 2021-07-11 NOTE — Assessment & Plan Note (Signed)
She has iron deficiency anemia ?Her last colonoscopy was in 2015 and it was normal ?She has chronic reflux disease ?Upper GI blood loss cannot be excluded ?I recommend dietary modification with iron rich diet and GI referral and she is in agreement ?

## 2021-07-11 NOTE — Assessment & Plan Note (Signed)
She has complete normalization of platelet count with prednisone ?I recommend prednisone taper ?For the next 5 days, she will take 20 mg daily ?After that, she will reduce to 10 mg daily ?I will see her back in 10 days for further follow-up ?

## 2021-07-11 NOTE — Progress Notes (Signed)
[name] OFFICE PROGRESS NOTE  Patient Care Team: Holland Commons, FNP as PCP - General (Internal Medicine)  ASSESSMENT & PLAN:  Chronic ITP (idiopathic thrombocytopenia) ([place]) Darlene has complete normalization of platelet count with prednisone I recommend prednisone taper For the next 5 days, Darlene will take 20 mg daily After that, Darlene will reduce to 10 mg daily I will see her back in 10 days for further follow-up  Deficiency anemia Darlene has iron deficiency anemia Her last colonoscopy was in 2015 and it was normal Darlene has chronic reflux disease Upper GI blood loss cannot be excluded I recommend dietary modification with iron rich diet and GI referral and Darlene is in agreement  Orders Placed This Encounter  Procedures   Ambulatory referral to Gastroenterology    Referral Priority:   Routine    Referral Type:   Consultation    Referral Reason:   Specialty Services Required    Number of Visits Requested:   1    All questions were answered. The patient knows to call the clinic with any problems, questions or concerns. The total time spent in the appointment was 20 minutes encounter with patients including review of chart and various tests results, discussions about plan of care and coordination of care plan   Heath Lark, MD 07/11/2021 3:17 PM  INTERVAL HISTORY: Please see below for problem oriented charting. Darlene returns for treatment follow-up with her daughter Darlene was started on 30 mg of prednisone last week due to recurrent ITP Is no recent bleeding complications The patient struggle to understand the meaning of recurrent ITP Darlene admits that her diet is typically devoid of meat Darlene started to take more protein and meat in her diet  REVIEW OF SYSTEMS:   Constitutional: Denies fevers, chills or abnormal weight loss Eyes: Denies blurriness of vision Ears, nose, mouth, throat, and face: Denies mucositis or sore throat Respiratory: Denies cough, dyspnea  or wheezes Cardiovascular: Denies palpitation, chest discomfort or lower extremity swelling Gastrointestinal:  Denies nausea, heartburn or change in bowel habits Skin: Denies abnormal skin rashes Lymphatics: Denies new lymphadenopathy or easy bruising Neurological:Denies numbness, tingling or new weaknesses Behavioral/Psych: Mood is stable, no new changes  All other systems were reviewed with the patient and are negative.  I have reviewed the past medical history, past surgical history, social history and family history with the patient and they are unchanged from previous note.  ALLERGIES:  is allergic to oxycodone, sulfamethoxazole-trimethoprim, codeine, doxycycline, meperidine and related, sulfa antibiotics, and sulfacetamide sodium.  MEDICATIONS:  Current Outpatient Medications  Medication Sig Dispense Refill   acetaminophen (TYLENOL) 500 MG tablet Take 1 tablet (500 mg total) by mouth every 6 (six) hours as needed for mild pain. (Patient taking differently: Take 1,000 mg by mouth every 8 (eight) hours as needed for mild pain.) 30 tablet 0   alclomethasone (ACLOVATE) 0.05 % cream SMARTSIG:1 Topical Every Night     amoxicillin (AMOXIL) 500 MG capsule Take 2,000 mg by mouth once as needed (One hour prior to dental appointment).      aspirin 81 MG tablet Take 81 mg by mouth daily.     augmented betamethasone dipropionate (DIPROLENE-AF) 0.05 % ointment Apply 1 application topically daily as needed (itching).      Calcium Carbonate-Vit D-Min (CALTRATE 600+D PLUS MINERALS) 600-800 MG-UNIT CHEW Chew 2 each by mouth 2 (two) times daily.     cholecalciferol (VITAMIN D) 1000 UNITS tablet Take 1,000 Units by mouth daily.  citalopram (CELEXA) 40 MG tablet Take 40 mg by mouth daily.      Cyanocobalamin (VITAMIN B-12 PO) Take 1,000 mcg by mouth daily.     diazepam (VALIUM) 5 MG tablet Take 1 tablet (5 mg total) by mouth every 6 (six) hours as needed for anxiety (Take 30-min prior MRI). 1 tablet 0    diclofenac Sodium (VOLTAREN) 1 % GEL diclofenac 1 % topical gel     Difluprednate 0.05 % EMUL Place 2 drops into the right eye 2 (two) times daily.      ergocalciferol (VITAMIN D2) 1.25 MG (50000 UT) capsule      esomeprazole (NEXIUM) 40 MG capsule Take 40 mg by mouth 2 (two) times daily before a meal.     famciclovir (FAMVIR) 125 MG tablet      L-Methylfolate-Algae-B12-B6 (METANX) 3-90.314-2-35 MG CAPS      levothyroxine (SYNTHROID, LEVOTHROID) 75 MCG tablet Take 75 mcg by mouth daily before breakfast.      lovastatin (MEVACOR) 20 MG tablet Take 20 mg by mouth daily.      magnesium oxide (MAG-OX) 400 MG tablet Take 400 mg by mouth daily.     metoprolol succinate (TOPROL-XL) 25 MG 24 hr tablet Take 25 mg by mouth daily.      mirabegron ER (MYRBETRIQ) 25 MG TB24 tablet Myrbetriq 25 mg tablet,extended release     moxifloxacin (VIGAMOX) 0.5 % ophthalmic solution moxifloxacin 0.5 % eye drops     Multiple Vitamins-Minerals (MULTIVITAMINS THER. W/MINERALS) TABS Take 1 tablet by mouth daily.     nystatin ointment (MYCOSTATIN) nystatin 100,000 unit/gram topical ointment     nystatin-triamcinolone ointment (MYCOLOG) Apply 1 application topically daily as needed (itching).      Polyethyl Glycol-Propyl Glycol 0.4-0.3 % SOLN Apply 2 drops to eye 2 (two) times daily as needed (for dry eyes).     predniSONE (DELTASONE) 10 MG tablet Take 1 tablet (10 mg total) by mouth daily with breakfast. 90 tablet 0   pregabalin (LYRICA) 50 MG capsule      pregabalin (LYRICA) 50 MG capsule Take 1 capsule by mouth 2 (two) times daily.     triamcinolone ointment (KENALOG) 0.1 % Apply 1 application topically 2 (two) times daily.     No current facility-administered medications for this visit.    SUMMARY OF ONCOLOGIC HISTORY:  JAMMI MORRISSETTE was transferred to my care after her prior physician has left.  I reviewed the patient's records extensive and collaborated the history with the patient. 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 Darlene never required any systemic treatment. 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 This has also resolved on its own. Darlene has never developed any additional adenopathy other than the single lymph node palpable in the left supraclavicular fossa.  Darlene did develop a second primary stage I,  0.8 cm,  ER negative cancer of the right breast, treated with lumpectomy, radiation and six cycles of CMF chemotherapy in 1994. No evidence of a new disease from the breast cancer either. Most recent mammogram done in June 2015 showed stable fibroglandular changes in her breasts. Darlene was called back for an ultrasound of the left breast which also came back benign. Darlene had a CT scan of the abdomen done on 11/14/15 because of significant intentional weight loss and abdominal discomfort.  Imaging studies show no evidence of lymphoma.  Moderate stool burden was noted. Repeat CT scan in March 2019 show stable lymphadenopathy with no signs or symptoms of cancer recurrence On 01/26/2018, Darlene underwent transurethral bladder resection of the tumor by urologist Between 2019-20 23, Darlene had recurrent ITP relapse  PHYSICAL EXAMINATION: ECOG PERFORMANCE STATUS: 1 - Symptomatic but completely ambulatory  Vitals:   07/11/21 1414  BP: 137/62  Pulse: 65  Resp: 18  Temp: 97.8 F (36.6 C)  SpO2: 99%   Filed Weights   07/11/21 1414  Weight: 176 lb 12.8 oz (80.2 kg)    GENERAL:alert, no distress and comfortable SKIN: skin color, texture, turgor are normal, no rashes or significant lesions EYES: normal, Conjunctiva are pink and non-injected, sclera clear OROPHARYNX:no exudate, no erythema and lips, buccal mucosa, and tongue normal  NECK: supple, thyroid normal size, non-tender, without  nodularity LYMPH:  no palpable lymphadenopathy in the cervical, axillary or inguinal LUNGS: clear to auscultation and percussion with normal breathing effort HEART: regular rate & rhythm and no murmurs and no lower extremity edema ABDOMEN:abdomen soft, non-tender and normal bowel sounds Musculoskeletal:no cyanosis of digits and no clubbing  NEURO: alert & oriented x 3 with fluent speech, no focal motor/sensory deficits  LABORATORY DATA:  I have reviewed the data as listed    Component Value Date/Time   NA 138 07/03/2021 1153   NA 135 (L) 07/07/2016 1035   K 4.0 07/03/2021 1153   K 4.8 07/07/2016 1035   CL 107 07/03/2021 1153   CL 106 07/11/2012 0906   CO2 24 07/03/2021 1153   CO2 24 07/07/2016 1035   GLUCOSE 81 07/03/2021 1153   GLUCOSE 85 07/07/2016 1035   GLUCOSE 107 (H) 07/11/2012 0906   BUN 15 07/03/2021 1153   BUN 19.9 07/07/2016 1035   CREATININE 0.64 07/03/2021 1153   CREATININE 0.7 07/07/2016 1035   CALCIUM 10.0 07/03/2021 1153   CALCIUM 10.2 07/07/2016 1035   PROT 6.6 07/03/2021 1153   PROT 6.6 07/07/2016 1035   ALBUMIN 4.1 07/03/2021 1153   ALBUMIN 3.9 07/07/2016 1035   AST 15 07/03/2021 1153   AST 14 07/07/2016 1035   ALT 14 07/03/2021 1153   ALT 17 07/07/2016 1035   ALKPHOS 58 07/03/2021 1153   ALKPHOS 58 07/07/2016 1035   BILITOT 0.9 07/03/2021 1153   BILITOT 0.61 07/07/2016 1035   GFRNONAA >60 07/03/2021 1153   GFRAA >60 05/19/2018 1551    No results found for: SPEP, UPEP  Lab Results  Component Value Date   WBC 13.1 (H) 07/11/2021   NEUTROABS 11.1 (H) 07/11/2021   HGB 10.9 (L) 07/11/2021   HCT 34.5 (L) 07/11/2021   MCV 80.2 07/11/2021   PLT 222 07/11/2021      Chemistry      Component Value Date/Time   NA 138 07/03/2021 1153   NA 135 (L) 07/07/2016 1035   K 4.0 07/03/2021 1153   K 4.8 07/07/2016 1035   CL 107 07/03/2021 1153   CL 106 07/11/2012 0906   CO2 24 07/03/2021 1153   CO2 24 07/07/2016 1035   BUN 15 07/03/2021 1153   BUN 19.9  07/07/2016 1035   CREATININE 0.64 07/03/2021 1153   CREATININE 0.7 07/07/2016 1035      Component Value Date/Time   CALCIUM 10.0 07/03/2021 1153   CALCIUM 10.2 07/07/2016 1035   ALKPHOS 58 07/03/2021 1153   ALKPHOS 58 07/07/2016 1035   AST 15 07/03/2021 1153   AST 14 07/07/2016  1035   ALT 14 07/03/2021 1153   ALT 17 07/07/2016 1035   BILITOT 0.9 07/03/2021 1153   BILITOT 0.61 07/07/2016 1035

## 2021-07-15 DIAGNOSIS — Z961 Presence of intraocular lens: Secondary | ICD-10-CM | POA: Diagnosis not present

## 2021-07-15 DIAGNOSIS — H52203 Unspecified astigmatism, bilateral: Secondary | ICD-10-CM | POA: Diagnosis not present

## 2021-07-15 DIAGNOSIS — H353131 Nonexudative age-related macular degeneration, bilateral, early dry stage: Secondary | ICD-10-CM | POA: Diagnosis not present

## 2021-07-21 ENCOUNTER — Inpatient Hospital Stay (HOSPITAL_BASED_OUTPATIENT_CLINIC_OR_DEPARTMENT_OTHER): Payer: Medicare Other | Admitting: Hematology and Oncology

## 2021-07-21 ENCOUNTER — Other Ambulatory Visit: Payer: Self-pay

## 2021-07-21 ENCOUNTER — Inpatient Hospital Stay: Payer: Medicare Other

## 2021-07-21 ENCOUNTER — Encounter: Payer: Self-pay | Admitting: Hematology and Oncology

## 2021-07-21 DIAGNOSIS — D693 Immune thrombocytopenic purpura: Secondary | ICD-10-CM | POA: Diagnosis not present

## 2021-07-21 DIAGNOSIS — K219 Gastro-esophageal reflux disease without esophagitis: Secondary | ICD-10-CM | POA: Diagnosis not present

## 2021-07-21 DIAGNOSIS — D638 Anemia in other chronic diseases classified elsewhere: Secondary | ICD-10-CM

## 2021-07-21 DIAGNOSIS — Z79899 Other long term (current) drug therapy: Secondary | ICD-10-CM | POA: Diagnosis not present

## 2021-07-21 DIAGNOSIS — D509 Iron deficiency anemia, unspecified: Secondary | ICD-10-CM | POA: Diagnosis not present

## 2021-07-21 DIAGNOSIS — Z7952 Long term (current) use of systemic steroids: Secondary | ICD-10-CM | POA: Diagnosis not present

## 2021-07-21 LAB — CBC WITH DIFFERENTIAL/PLATELET
Abs Immature Granulocytes: 0.06 10*3/uL (ref 0.00–0.07)
Basophils Absolute: 0.1 10*3/uL (ref 0.0–0.1)
Basophils Relative: 0 %
Eosinophils Absolute: 0.1 10*3/uL (ref 0.0–0.5)
Eosinophils Relative: 1 %
HCT: 34.5 % — ABNORMAL LOW (ref 36.0–46.0)
Hemoglobin: 10.9 g/dL — ABNORMAL LOW (ref 12.0–15.0)
Immature Granulocytes: 0 %
Lymphocytes Relative: 9 %
Lymphs Abs: 1.3 10*3/uL (ref 0.7–4.0)
MCH: 25.3 pg — ABNORMAL LOW (ref 26.0–34.0)
MCHC: 31.6 g/dL (ref 30.0–36.0)
MCV: 80 fL (ref 80.0–100.0)
Monocytes Absolute: 0.5 10*3/uL (ref 0.1–1.0)
Monocytes Relative: 4 %
Neutro Abs: 11.8 10*3/uL — ABNORMAL HIGH (ref 1.7–7.7)
Neutrophils Relative %: 86 %
Platelets: 152 10*3/uL (ref 150–400)
RBC: 4.31 MIL/uL (ref 3.87–5.11)
RDW: 15.2 % (ref 11.5–15.5)
WBC: 13.9 10*3/uL — ABNORMAL HIGH (ref 4.0–10.5)
nRBC: 0 % (ref 0.0–0.2)

## 2021-07-21 MED ORDER — PREDNISONE 10 MG PO TABS
5.0000 mg | ORAL_TABLET | Freq: Every day | ORAL | 0 refills | Status: DC
Start: 2021-07-21 — End: 2021-07-28

## 2021-07-21 NOTE — Progress Notes (Signed)
[place] ?OFFICE PROGRESS NOTE ? ?Patient Care Team: ?Holland Commons, FNP as PCP - General (Internal Medicine) ? ?ASSESSMENT & PLAN:  ?Chronic ITP (idiopathic thrombocytopenia) (HCC) ?Her platelet count is normal although it is trending down ?She is not symptomatic ?I recommend reducing prednisone to 5 mg daily ?She will try to cut the pill to half ?If not possible, we will prescribe 5 mg tablet to her pharmacy ?I plan to see her again next week for further follow-up ? ?Anemia, chronic disease ?She had history of iron deficiency anemia ?Repeat iron studies show borderline low iron level ?I will get her started on iron supplement in the future ? ?No orders of the defined types were placed in this encounter. ? ? ?All questions were answered. The patient knows to call the clinic with any problems, questions or concerns. ?The total time spent in the appointment was 20 minutes encounter with patients including review of chart and various tests results, discussions about plan of care and coordination of care plan ?  ?Heath Lark, MD ?07/21/2021 3:04 PM ? ?INTERVAL HISTORY: ?Please see below for problem oriented charting. ?she returns for treatment follow-up on prednisone for recurrent ITP ?She complains of fatigue ?No recent bleeding ? ?REVIEW OF SYSTEMS:   ?Constitutional: Denies fevers, chills or abnormal weight loss ?Eyes: Denies blurriness of vision ?Ears, nose, mouth, throat, and face: Denies mucositis or sore throat ?Respiratory: Denies cough, dyspnea or wheezes ?Cardiovascular: Denies palpitation, chest discomfort or lower extremity swelling ?Gastrointestinal:  Denies nausea, heartburn or change in bowel habits ?Skin: Denies abnormal skin rashes ?Lymphatics: Denies new lymphadenopathy or easy bruising ?Neurological:Denies numbness, tingling or new weaknesses ?Behavioral/Psych: Mood is stable, no new changes  ?All other systems were reviewed with the patient and are negative. ? ?I have reviewed  the past medical history, past surgical history, social history and family history with the patient and they are unchanged from previous note. ? ?ALLERGIES:  is allergic to oxycodone, sulfamethoxazole-trimethoprim, codeine, doxycycline, meperidine and related, sulfa antibiotics, and sulfacetamide sodium. ? ?MEDICATIONS:  ?Current Outpatient Medications  ?Medication Sig Dispense Refill  ? acetaminophen (TYLENOL) 500 MG tablet Take 1 tablet (500 mg total) by mouth every 6 (six) hours as needed for mild pain. (Patient taking differently: Take 1,000 mg by mouth every 8 (eight) hours as needed for mild pain.) 30 tablet 0  ? alclomethasone (ACLOVATE) 0.05 % cream SMARTSIG:1 Topical Every Night    ? amoxicillin (AMOXIL) 500 MG capsule Take 2,000 mg by mouth once as needed (One hour prior to dental appointment).     ? aspirin 81 MG tablet Take 81 mg by mouth daily.    ? augmented betamethasone dipropionate (DIPROLENE-AF) 0.05 % ointment Apply 1 application topically daily as needed (itching).     ? Calcium Carbonate-Vit D-Min (CALTRATE 600+D PLUS MINERALS) 600-800 MG-UNIT CHEW Chew 2 each by mouth 2 (two) times daily.    ? cholecalciferol (VITAMIN D) 1000 UNITS tablet Take 1,000 Units by mouth daily.    ? citalopram (CELEXA) 40 MG tablet Take 40 mg by mouth daily.     ? Cyanocobalamin (VITAMIN B-12 PO) Take 1,000 mcg by mouth daily.    ? diazepam (VALIUM) 5 MG tablet Take 1 tablet (5 mg total) by mouth every 6 (six) hours as needed for anxiety (Take 30-min prior MRI). 1 tablet 0  ? diclofenac Sodium (VOLTAREN) 1 % GEL diclofenac 1 % topical gel    ? Difluprednate 0.05 % EMUL Place 2 drops into the  right eye 2 (two) times daily.     ? ergocalciferol (VITAMIN D2) 1.25 MG (50000 UT) capsule     ? esomeprazole (NEXIUM) 40 MG capsule Take 40 mg by mouth 2 (two) times daily before a meal.    ? famciclovir (FAMVIR) 125 MG tablet     ? L-Methylfolate-Algae-B12-B6 (METANX) 3-90.314-2-35 MG CAPS     ? levothyroxine (SYNTHROID,  LEVOTHROID) 75 MCG tablet Take 75 mcg by mouth daily before breakfast.     ? lovastatin (MEVACOR) 20 MG tablet Take 20 mg by mouth daily.     ? magnesium oxide (MAG-OX) 400 MG tablet Take 400 mg by mouth daily.    ? metoprolol succinate (TOPROL-XL) 25 MG 24 hr tablet Take 25 mg by mouth daily.     ? mirabegron ER (MYRBETRIQ) 25 MG TB24 tablet Myrbetriq 25 mg tablet,extended release    ? moxifloxacin (VIGAMOX) 0.5 % ophthalmic solution moxifloxacin 0.5 % eye drops    ? Multiple Vitamins-Minerals (MULTIVITAMINS THER. W/MINERALS) TABS Take 1 tablet by mouth daily.    ? nystatin ointment (MYCOSTATIN) nystatin 100,000 unit/gram topical ointment    ? nystatin-triamcinolone ointment (MYCOLOG) Apply 1 application topically daily as needed (itching).     ? Polyethyl Glycol-Propyl Glycol 0.4-0.3 % SOLN Apply 2 drops to eye 2 (two) times daily as needed (for dry eyes).    ? predniSONE (DELTASONE) 10 MG tablet Take 0.5 tablets (5 mg total) by mouth daily with breakfast. 90 tablet 0  ? pregabalin (LYRICA) 50 MG capsule     ? pregabalin (LYRICA) 50 MG capsule Take 1 capsule by mouth 2 (two) times daily.    ? triamcinolone ointment (KENALOG) 0.1 % Apply 1 application topically 2 (two) times daily.    ? ?No current facility-administered medications for this visit.  ? ? ?SUMMARY OF ONCOLOGIC HISTORY: ? ?BEDIE DOMINEY was transferred to my care after her prior physician has left.  ?I reviewed the patient's records extensive and collaborated the history with the patient. Summary of her history is as follows: ?This patient was diagnosed with a remote history of limited stage low grade B-cell non-Hodgkin's lymphoma, presenting with left supraclavicular lymphadenopathy in April 1999. She never required any systemic treatment. 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 This has also resolved on its own. She has never developed any additional adenopathy other than the single lymph  node palpable in the left supraclavicular fossa.  ?She did develop a second primary stage I,  0.8 cm,  ER negative cancer of the right breast, treated with lumpectomy, radiation and six cycles of CMF chemotherapy in 1994. No evidence of a new disease from the breast cancer either. Most recent mammogram done in June 2015 showed stable fibroglandular changes in her breasts. She was called back for an ultrasound of the left breast which also came back benign. ?She had a CT scan of the abdomen done on 11/14/15 because of significant intentional weight loss and abdominal discomfort.  Imaging studies show no evidence of lymphoma.  Moderate stool burden was noted. ?Repeat CT scan in March 2019 show stable lymphadenopathy with no signs or symptoms of cancer recurrence ?On 01/26/2018, she underwent transurethral bladder resection of the tumor by urologist ?Between 2019-20 23, she had recurrent ITP relapse ? ?PHYSICAL EXAMINATION: ?ECOG PERFORMANCE STATUS: 1 - Symptomatic but completely ambulatory ? ?Vitals:  ? 07/21/21 1439  ?BP: (!) 130/53  ?Pulse: 71  ?Resp: 18  ?Temp: 99.9 ?F (37.7 ?C)  ?  SpO2: 94%  ? ?Filed Weights  ? 07/21/21 1439  ?Weight: 173 lb 12.8 oz (78.8 kg)  ? ? ?GENERAL:alert, no distress and comfortable ?NEURO: alert & oriented x 3 with fluent speech, no focal motor/sensory deficits ? ?LABORATORY DATA:  ?I have reviewed the data as listed ?   ?Component Value Date/Time  ? NA 138 07/03/2021 1153  ? NA 135 (L) 07/07/2016 1035  ? K 4.0 07/03/2021 1153  ? K 4.8 07/07/2016 1035  ? CL 107 07/03/2021 1153  ? CL 106 07/11/2012 0906  ? CO2 24 07/03/2021 1153  ? CO2 24 07/07/2016 1035  ? GLUCOSE 81 07/03/2021 1153  ? GLUCOSE 85 07/07/2016 1035  ? GLUCOSE 107 (H) 07/11/2012 0906  ? BUN 15 07/03/2021 1153  ? BUN 19.9 07/07/2016 1035  ? CREATININE 0.64 07/03/2021 1153  ? CREATININE 0.7 07/07/2016 1035  ? CALCIUM 10.0 07/03/2021 1153  ? CALCIUM 10.2 07/07/2016 1035  ? PROT 6.6 07/03/2021 1153  ? PROT 6.6 07/07/2016 1035  ?  ALBUMIN 4.1 07/03/2021 1153  ? ALBUMIN 3.9 07/07/2016 1035  ? AST 15 07/03/2021 1153  ? AST 14 07/07/2016 1035  ? ALT 14 07/03/2021 1153  ? ALT 17 07/07/2016 1035  ? ALKPHOS 58 07/03/2021 1153  ? ALKPHOS 58 03/0

## 2021-07-21 NOTE — Assessment & Plan Note (Signed)
Her platelet count is normal although it is trending down ?She is not symptomatic ?I recommend reducing prednisone to 5 mg daily ?She will try to cut the pill to half ?If not possible, we will prescribe 5 mg tablet to her pharmacy ?I plan to see her again next week for further follow-up ?

## 2021-07-21 NOTE — Assessment & Plan Note (Signed)
She had history of iron deficiency anemia ?Repeat iron studies show borderline low iron level ?I will get her started on iron supplement in the future ?

## 2021-07-28 ENCOUNTER — Inpatient Hospital Stay (HOSPITAL_BASED_OUTPATIENT_CLINIC_OR_DEPARTMENT_OTHER): Payer: Medicare Other | Admitting: Hematology and Oncology

## 2021-07-28 ENCOUNTER — Other Ambulatory Visit: Payer: Self-pay

## 2021-07-28 ENCOUNTER — Inpatient Hospital Stay: Payer: Medicare Other

## 2021-07-28 VITALS — BP 118/54 | HR 69 | Temp 97.7°F | Resp 18 | Ht 62.0 in | Wt 171.8 lb

## 2021-07-28 DIAGNOSIS — Z853 Personal history of malignant neoplasm of breast: Secondary | ICD-10-CM

## 2021-07-28 DIAGNOSIS — Z79899 Other long term (current) drug therapy: Secondary | ICD-10-CM | POA: Diagnosis not present

## 2021-07-28 DIAGNOSIS — D638 Anemia in other chronic diseases classified elsewhere: Secondary | ICD-10-CM

## 2021-07-28 DIAGNOSIS — Z8572 Personal history of non-Hodgkin lymphomas: Secondary | ICD-10-CM | POA: Diagnosis not present

## 2021-07-28 DIAGNOSIS — D509 Iron deficiency anemia, unspecified: Secondary | ICD-10-CM | POA: Diagnosis not present

## 2021-07-28 DIAGNOSIS — K219 Gastro-esophageal reflux disease without esophagitis: Secondary | ICD-10-CM | POA: Diagnosis not present

## 2021-07-28 DIAGNOSIS — D693 Immune thrombocytopenic purpura: Secondary | ICD-10-CM

## 2021-07-28 DIAGNOSIS — Z7952 Long term (current) use of systemic steroids: Secondary | ICD-10-CM | POA: Diagnosis not present

## 2021-07-28 LAB — CBC WITH DIFFERENTIAL/PLATELET
Abs Immature Granulocytes: 0.03 10*3/uL (ref 0.00–0.07)
Basophils Absolute: 0.1 10*3/uL (ref 0.0–0.1)
Basophils Relative: 1 %
Eosinophils Absolute: 0.1 10*3/uL (ref 0.0–0.5)
Eosinophils Relative: 1 %
HCT: 34 % — ABNORMAL LOW (ref 36.0–46.0)
Hemoglobin: 10.7 g/dL — ABNORMAL LOW (ref 12.0–15.0)
Immature Granulocytes: 0 %
Lymphocytes Relative: 12 %
Lymphs Abs: 1.2 10*3/uL (ref 0.7–4.0)
MCH: 25.2 pg — ABNORMAL LOW (ref 26.0–34.0)
MCHC: 31.5 g/dL (ref 30.0–36.0)
MCV: 80.2 fL (ref 80.0–100.0)
Monocytes Absolute: 0.5 10*3/uL (ref 0.1–1.0)
Monocytes Relative: 5 %
Neutro Abs: 8.4 10*3/uL — ABNORMAL HIGH (ref 1.7–7.7)
Neutrophils Relative %: 81 %
Platelets: 68 10*3/uL — ABNORMAL LOW (ref 150–400)
RBC: 4.24 MIL/uL (ref 3.87–5.11)
RDW: 15.8 % — ABNORMAL HIGH (ref 11.5–15.5)
WBC: 10.3 10*3/uL (ref 4.0–10.5)
nRBC: 0 % (ref 0.0–0.2)

## 2021-07-28 MED ORDER — PREDNISONE 10 MG PO TABS: 10.0000 mg | ORAL_TABLET | Freq: Every day | ORAL | 0 refills | Status: DC

## 2021-07-29 ENCOUNTER — Telehealth: Payer: Self-pay

## 2021-07-29 ENCOUNTER — Encounter: Payer: Self-pay | Admitting: Hematology and Oncology

## 2021-07-29 NOTE — Assessment & Plan Note (Signed)
It is not clear whether her ITP is associated with recurrent lymphoma ?I recommend CT imaging to assess and she is in agreement ?If she has signs of lymphoma, we will treat her with rituximab based regimen ?

## 2021-07-29 NOTE — Assessment & Plan Note (Signed)
We will observe for now ?I would recommend her to start oral iron supplement next week after I see her with results of CT imaging ?

## 2021-07-29 NOTE — Assessment & Plan Note (Signed)
Her examination is benign ?She will be observed closely ?

## 2021-07-29 NOTE — Progress Notes (Signed)
[place] ?OFFICE PROGRESS NOTE ? ?Patient Care Team: ?Holland Commons, FNP as PCP - General (Internal Medicine) ? ?ASSESSMENT & PLAN:  ?History of non-Hodgkin's lymphoma ?It is not clear whether her ITP is associated with recurrent lymphoma ?I recommend CT imaging to assess and she is in agreement ?If she has signs of lymphoma, we will treat her with rituximab based regimen ? ?History of breast cancer in female ?Her examination is benign ?She will be observed closely ? ?Chronic ITP (idiopathic thrombocytopenia) (HCC) ?Unfortunately, she has relapse of ITP with recent prednisone taper ?I recommend the patient to pursue CT imaging to rule out recurrent lymphoma ?She is also instructed to increase prednisone to 10 mg daily until I see her next week ? ?Anemia, chronic disease ?We will observe for now ?I would recommend her to start oral iron supplement next week after I see her with results of CT imaging ? ?Orders Placed This Encounter  ?Procedures  ? CT CHEST ABDOMEN PELVIS W CONTRAST  ?  Standing Status:   Future  ?  Standing Expiration Date:   07/29/2022  ?  Order Specific Question:   Preferred imaging location?  ?  Answer:   [facility]  ?  Order Specific Question:   Radiology Contrast Protocol - do NOT remove file path  ?  Answer:   [email or web address]  ? ? ?All questions were answered. The patient knows to call the clinic with any problems, questions or concerns. ?The total time spent in the appointment was 30 minutes encounter with patients including review of chart and various tests results, discussions about plan of care and coordination of care plan ?  ?Heath Lark, MD ?07/29/2021 10:24 AM ? ?INTERVAL HISTORY: ?Please see below for problem oriented charting. ?she returns for treatment follow-up with family ?She denies recent bleeding ?No new lymphadenopathy ?Denies recent breast changes ? ?REVIEW OF SYSTEMS:   ?Constitutional: Denies fevers,  chills or abnormal weight loss ?Eyes: Denies blurriness of vision ?Ears, nose, mouth, throat, and face: Denies mucositis or sore throat ?Respiratory: Denies cough, dyspnea or wheezes ?Cardiovascular: Denies palpitation, chest discomfort or lower extremity swelling ?Gastrointestinal:  Denies nausea, heartburn or change in bowel habits ?Skin: Denies abnormal skin rashes ?Lymphatics: Denies new lymphadenopathy or easy bruising ?Neurological:Denies numbness, tingling or new weaknesses ?Behavioral/Psych: Mood is stable, no new changes  ?All other systems were reviewed with the patient and are negative. ? ?I have reviewed the past medical history, past surgical history, social history and family history with the patient and they are unchanged from previous note. ? ?ALLERGIES:  is allergic to oxycodone, sulfamethoxazole-trimethoprim, codeine, doxycycline, meperidine and related, sulfa antibiotics, and sulfacetamide sodium. ? ?MEDICATIONS:  ?Current Outpatient Medications  ?Medication Sig Dispense Refill  ? acetaminophen (TYLENOL) 500 MG tablet Take 1 tablet (500 mg total) by mouth every 6 (six) hours as needed for mild pain. (Patient taking differently: Take 1,000 mg by mouth every 8 (eight) hours as needed for mild pain.) 30 tablet 0  ? alclomethasone (ACLOVATE) 0.05 % cream SMARTSIG:1 Topical Every Night    ? amoxicillin (AMOXIL) 500 MG capsule Take 2,000 mg by mouth once as needed (One hour prior to dental appointment).     ? aspirin 81 MG tablet Take 81 mg by mouth daily.    ? augmented betamethasone dipropionate (DIPROLENE-AF) 0.05 % ointment Apply 1 application topically daily as needed (itching).     ? Calcium Carbonate-Vit D-Min (CALTRATE 600+D PLUS MINERALS) 600-800 MG-UNIT CHEW Chew 2 each by  mouth 2 (two) times daily.    ? cholecalciferol (VITAMIN D) 1000 UNITS tablet Take 1,000 Units by mouth daily.    ? citalopram (CELEXA) 40 MG tablet Take 40 mg by mouth daily.     ? Cyanocobalamin (VITAMIN B-12 PO) Take  1,000 mcg by mouth daily.    ? diazepam (VALIUM) 5 MG tablet Take 1 tablet (5 mg total) by mouth every 6 (six) hours as needed for anxiety (Take 30-min prior MRI). 1 tablet 0  ? diclofenac Sodium (VOLTAREN) 1 % GEL diclofenac 1 % topical gel    ? Difluprednate 0.05 % EMUL Place 2 drops into the right eye 2 (two) times daily.     ? ergocalciferol (VITAMIN D2) 1.25 MG (50000 UT) capsule     ? esomeprazole (NEXIUM) 40 MG capsule Take 40 mg by mouth 2 (two) times daily before a meal.    ? famciclovir (FAMVIR) 125 MG tablet     ? L-Methylfolate-Algae-B12-B6 (METANX) 3-90.314-2-35 MG CAPS     ? levothyroxine (SYNTHROID, LEVOTHROID) 75 MCG tablet Take 75 mcg by mouth daily before breakfast.     ? lovastatin (MEVACOR) 20 MG tablet Take 20 mg by mouth daily.     ? magnesium oxide (MAG-OX) 400 MG tablet Take 400 mg by mouth daily.    ? metoprolol succinate (TOPROL-XL) 25 MG 24 hr tablet Take 25 mg by mouth daily.     ? mirabegron ER (MYRBETRIQ) 25 MG TB24 tablet Myrbetriq 25 mg tablet,extended release    ? moxifloxacin (VIGAMOX) 0.5 % ophthalmic solution moxifloxacin 0.5 % eye drops    ? Multiple Vitamins-Minerals (MULTIVITAMINS THER. W/MINERALS) TABS Take 1 tablet by mouth daily.    ? nystatin ointment (MYCOSTATIN) nystatin 100,000 unit/gram topical ointment    ? nystatin-triamcinolone ointment (MYCOLOG) Apply 1 application topically daily as needed (itching).     ? Polyethyl Glycol-Propyl Glycol 0.4-0.3 % SOLN Apply 2 drops to eye 2 (two) times daily as needed (for dry eyes).    ? predniSONE (DELTASONE) 10 MG tablet Take 1 tablet (10 mg total) by mouth daily with breakfast. 90 tablet 0  ? pregabalin (LYRICA) 50 MG capsule     ? pregabalin (LYRICA) 50 MG capsule Take 1 capsule by mouth 2 (two) times daily.    ? triamcinolone ointment (KENALOG) 0.1 % Apply 1 application topically 2 (two) times daily.    ? ?No current facility-administered medications for this visit.  ? ? ?SUMMARY OF ONCOLOGIC HISTORY: ? ?Darlene [name] was  transferred to my care after her prior physician has left.  ?I reviewed the patient's records extensive and collaborated the history with the patient. Summary of her history is as follows: ?This patient was diagnosed with a remote history of limited stage low grade B-cell non-Hodgkin's lymphoma, presenting with left supraclavicular lymphadenopathy in April 1999. She never required any systemic treatment. 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 This has also resolved on its own. She has never developed any additional adenopathy other than the single lymph node palpable in the left supraclavicular fossa.  ?She did develop a second primary stage I,  0.8 cm,  ER negative cancer of the right breast, treated with lumpectomy, radiation and six cycles of CMF chemotherapy in 1994. No evidence of a new disease from the breast cancer either. Most recent mammogram done in June 2015 showed stable fibroglandular changes in her breasts. She was called back for an ultrasound of the left breast which  also came back benign. ?She had a CT scan of the abdomen done on 11/14/15 because of significant intentional weight loss and abdominal discomfort.  Imaging studies show no evidence of lymphoma.  Moderate stool burden was noted. ?Repeat CT scan in March 2019 show stable lymphadenopathy with no signs or symptoms of cancer recurrence ?On 01/26/2018, she underwent transurethral bladder resection of the tumor by urologist ?Between 2019-2023, she had recurrent ITP relapse ? ?PHYSICAL EXAMINATION: ?ECOG PERFORMANCE STATUS: 1 - Symptomatic but completely ambulatory ? ?Vitals:  ? 07/28/21 1348  ?BP: (!) 118/54  ?Pulse: 69  ?Resp: 18  ?Temp: 97.7 ?F (36.5 ?C)  ?SpO2: 98%  ? ?Filed Weights  ? 07/28/21 1348  ?Weight: 171 lb 12.8 oz (77.9 kg)  ? ? ?GENERAL:alert, no distress and comfortable ?SKIN: skin color, texture, turgor are normal, no rashes or significant lesions ?EYES: normal, Conjunctiva  are pink and non-injected, sclera clear ?OROPHARYNX:no exudate, no erythema and lips, buccal mucosa, and tongue normal  ?NECK: supple, thyroid normal size, non-tender, without nodularity ?LYMPH:  no palpabl

## 2021-07-29 NOTE — Assessment & Plan Note (Signed)
Unfortunately, she has relapse of ITP with recent prednisone taper ?I recommend the patient to pursue CT imaging to rule out recurrent lymphoma ?She is also instructed to increase prednisone to 10 mg daily until I see her next week ?

## 2021-07-29 NOTE — Telephone Encounter (Signed)
Called and given daughter CT appt on 4/3 at [facility], arrive at 2:30 for 3 pm appt. NPO 4 hours prior, drink 1st bottle of contrast at 1 pm and 2nd bottle at 2pm. Daughter verbalized understanding. ?

## 2021-08-04 ENCOUNTER — Ambulatory Visit (HOSPITAL_COMMUNITY)
Admission: RE | Admit: 2021-08-04 | Discharge: 2021-08-04 | Disposition: A | Discharge status: Home / Self Care | Payer: Medicare Other | Source: Ambulatory Visit | Attending: Hematology and Oncology | Admitting: Hematology and Oncology

## 2021-08-04 DIAGNOSIS — Z8572 Personal history of non-Hodgkin lymphomas: Secondary | ICD-10-CM | POA: Diagnosis not present

## 2021-08-04 DIAGNOSIS — C8590 Non-Hodgkin lymphoma, unspecified, unspecified site: Secondary | ICD-10-CM | POA: Diagnosis not present

## 2021-08-04 DIAGNOSIS — D693 Immune thrombocytopenic purpura: Secondary | ICD-10-CM | POA: Diagnosis not present

## 2021-08-04 DIAGNOSIS — Z853 Personal history of malignant neoplasm of breast: Secondary | ICD-10-CM | POA: Insufficient documentation

## 2021-08-04 MED ORDER — IOHEXOL 300 MG/ML  SOLN
100.0000 mL | Freq: Once | INTRAMUSCULAR | Status: AC | PRN
  Administered 2021-08-04: 100 mL via INTRAVENOUS

## 2021-08-07 ENCOUNTER — Inpatient Hospital Stay (HOSPITAL_BASED_OUTPATIENT_CLINIC_OR_DEPARTMENT_OTHER): Payer: Medicare Other | Admitting: Hematology and Oncology

## 2021-08-07 ENCOUNTER — Other Ambulatory Visit (HOSPITAL_COMMUNITY): Payer: Self-pay

## 2021-08-07 ENCOUNTER — Telehealth: Payer: Self-pay | Admitting: Pharmacy Technician

## 2021-08-07 ENCOUNTER — Other Ambulatory Visit: Payer: Self-pay

## 2021-08-07 ENCOUNTER — Encounter: Payer: Self-pay | Admitting: Hematology and Oncology

## 2021-08-07 ENCOUNTER — Inpatient Hospital Stay: Payer: Medicare Other | Attending: Hematology and Oncology

## 2021-08-07 ENCOUNTER — Telehealth: Payer: Self-pay

## 2021-08-07 DIAGNOSIS — Z79899 Other long term (current) drug therapy: Secondary | ICD-10-CM | POA: Diagnosis not present

## 2021-08-07 DIAGNOSIS — I700 Atherosclerosis of aorta: Secondary | ICD-10-CM | POA: Insufficient documentation

## 2021-08-07 DIAGNOSIS — E611 Iron deficiency: Secondary | ICD-10-CM | POA: Insufficient documentation

## 2021-08-07 DIAGNOSIS — D638 Anemia in other chronic diseases classified elsewhere: Secondary | ICD-10-CM | POA: Diagnosis not present

## 2021-08-07 DIAGNOSIS — D539 Nutritional anemia, unspecified: Secondary | ICD-10-CM | POA: Diagnosis not present

## 2021-08-07 DIAGNOSIS — M4854XA Collapsed vertebra, not elsewhere classified, thoracic region, initial encounter for fracture: Secondary | ICD-10-CM | POA: Insufficient documentation

## 2021-08-07 DIAGNOSIS — Z8551 Personal history of malignant neoplasm of bladder: Secondary | ICD-10-CM | POA: Diagnosis not present

## 2021-08-07 DIAGNOSIS — D693 Immune thrombocytopenic purpura: Secondary | ICD-10-CM | POA: Diagnosis not present

## 2021-08-07 DIAGNOSIS — Z853 Personal history of malignant neoplasm of breast: Secondary | ICD-10-CM

## 2021-08-07 DIAGNOSIS — Z8572 Personal history of non-Hodgkin lymphomas: Secondary | ICD-10-CM | POA: Insufficient documentation

## 2021-08-07 DIAGNOSIS — Z881 Allergy status to other antibiotic agents status: Secondary | ICD-10-CM | POA: Diagnosis not present

## 2021-08-07 DIAGNOSIS — M4316 Spondylolisthesis, lumbar region: Secondary | ICD-10-CM | POA: Insufficient documentation

## 2021-08-07 DIAGNOSIS — Z882 Allergy status to sulfonamides status: Secondary | ICD-10-CM | POA: Diagnosis not present

## 2021-08-07 DIAGNOSIS — I517 Cardiomegaly: Secondary | ICD-10-CM | POA: Diagnosis not present

## 2021-08-07 DIAGNOSIS — Z885 Allergy status to narcotic agent status: Secondary | ICD-10-CM | POA: Insufficient documentation

## 2021-08-07 LAB — CBC WITH DIFFERENTIAL/PLATELET
Abs Immature Granulocytes: 0.04 10*3/uL (ref 0.00–0.07)
Basophils Absolute: 0 10*3/uL (ref 0.0–0.1)
Basophils Relative: 0 %
Eosinophils Absolute: 0.1 10*3/uL (ref 0.0–0.5)
Eosinophils Relative: 1 %
HCT: 33 % — ABNORMAL LOW (ref 36.0–46.0)
Hemoglobin: 10.4 g/dL — ABNORMAL LOW (ref 12.0–15.0)
Immature Granulocytes: 0 %
Lymphocytes Relative: 10 %
Lymphs Abs: 1 10*3/uL (ref 0.7–4.0)
MCH: 25.5 pg — ABNORMAL LOW (ref 26.0–34.0)
MCHC: 31.5 g/dL (ref 30.0–36.0)
MCV: 80.9 fL (ref 80.0–100.0)
Monocytes Absolute: 0.4 10*3/uL (ref 0.1–1.0)
Monocytes Relative: 4 %
Neutro Abs: 8.3 10*3/uL — ABNORMAL HIGH (ref 1.7–7.7)
Neutrophils Relative %: 85 %
Platelets: 143 10*3/uL — ABNORMAL LOW (ref 150–400)
RBC: 4.08 MIL/uL (ref 3.87–5.11)
RDW: 15.7 % — ABNORMAL HIGH (ref 11.5–15.5)
WBC: 9.8 10*3/uL (ref 4.0–10.5)
nRBC: 0 % (ref 0.0–0.2)

## 2021-08-07 MED ORDER — ELTROMBOPAG OLAMINE 50 MG PO TABS
50.0000 mg | ORAL_TABLET | Freq: Every day | ORAL | 2 refills | Status: DC
  Filled 2021-08-07: qty 30, 30d supply, fill #0

## 2021-08-07 NOTE — Assessment & Plan Note (Signed)
She has no signs of cancer recurrence ?

## 2021-08-07 NOTE — Assessment & Plan Note (Signed)
She is sensitive to prednisone ?Unfortunately, I was not able to get her prednisone dose lower than 10 mg ?Chronic prednisone therapy is not sustainable ?We discussed risk, benefits, side effects of rituximab versus Promacta ?Ultimately, she would like to try Promacta ?I will start her on 50 mg dose ?As soon as we can get her started, I will see her within 7 to 10 days for follow-up ?In the meantime, she will continue current dose of prednisone ?We discussed the natural history of ITP ?

## 2021-08-07 NOTE — Progress Notes (Signed)
[name] ?OFFICE PROGRESS NOTE ? ?Patient Care Team: ?Holland Commons, FNP as PCP - General (Internal Medicine) ? ?ASSESSMENT & PLAN:  ?History of breast cancer in female ?She has no signs of cancer recurrence ? ?History of non-Hodgkin's lymphoma ?Even though she has persistent lymphadenopathy in the para-aortic region, the size of the lymph nodes are stable over the course of the last 4 years ?I do not believe she has recurrent lymphoma that contributed to her ITP ?She does not need long-term imaging or follow-up in that regard ? ?Chronic ITP (idiopathic thrombocytopenia) (HCC) ?She is sensitive to prednisone ?Unfortunately, I was not able to get her prednisone dose lower than 10 mg ?Chronic prednisone therapy is not sustainable ?We discussed risk, benefits, side effects of rituximab versus Promacta ?Ultimately, she would like to try Promacta ?I will start her on 50 mg dose ?As soon as we can get her started, I will see her within 7 to 10 days for follow-up ?In the meantime, she will continue current dose of prednisone ?We discussed the natural history of ITP ? ?Deficiency anemia ?She have signs of iron deficiency ?I recommend oral iron supplement ?Her last colonoscopy in 2015 was normal ?It is not clear that she is compliant taking proton pump inhibitor twice daily ?I recommend GI consult for EGD evaluation and she is in agreement ? ?Orders Placed This Encounter  ?Procedures  ? Ambulatory referral to Gastroenterology  ?  Referral Priority:   Routine  ?  Referral Type:   Consultation  ?  Referral Reason:   Specialty Services Required  ?  Referred to Provider:   Gatha Mayer, MD  ?  Number of Visits Requested:   1  ? ? ?All questions were answered. The patient knows to call the clinic with any problems, questions or concerns. ?The total time spent in the appointment was 40 minutes encounter with patients including review of chart and various tests results, discussions about plan of care and  coordination of care plan ?  ?Heath Lark, MD ?08/07/2021 2:15 PM ? ?INTERVAL HISTORY: ?Please see below for problem oriented charting. ?she returns for treatment follow-up with her daughter ?She has history of non-Hodgkin lymphoma, breast cancer and bladder cancer as well as recurrent ITP ?She is currently taking prednisone 10 mg daily for recurrent ITP ?She returns to review blood work and CT imaging ?She have no side effects from prednisone for so far ?The patient has a long list of medications including proton pump inhibitor twice daily ?It is not clear to me that she is taking Nexium twice daily ?Her last colonoscopy was in 2015, normal ?The patient denies any recent signs or symptoms of bleeding such as spontaneous epistaxis, hematuria or hematochezia. ? ? ?REVIEW OF SYSTEMS:   ?Constitutional: Denies fevers, chills or abnormal weight loss ?Eyes: Denies blurriness of vision ?Ears, nose, mouth, throat, and face: Denies mucositis or sore throat ?Respiratory: Denies cough, dyspnea or wheezes ?Cardiovascular: Denies palpitation, chest discomfort or lower extremity swelling ?Gastrointestinal:  Denies nausea, heartburn or change in bowel habits ?Skin: Denies abnormal skin rashes ?Lymphatics: Denies new lymphadenopathy or easy bruising ?Neurological:Denies numbness, tingling or new weaknesses ?Behavioral/Psych: Mood is stable, no new changes  ?All other systems were reviewed with the patient and are negative. ? ?I have reviewed the past medical history, past surgical history, social history and family history with the patient and they are unchanged from previous note. ? ?ALLERGIES:  is allergic to oxycodone, sulfamethoxazole-trimethoprim, codeine, doxycycline, meperidine and  related, sulfa antibiotics, and sulfacetamide sodium. ? ?MEDICATIONS:  ?Current Outpatient Medications  ?Medication Sig Dispense Refill  ? eltrombopag (PROMACTA) 50 MG tablet Take 1 tablet (50 mg total) by mouth daily. Take on an empty stomach 1  hour before a meal or 2 hours after 30 tablet 2  ? acetaminophen (TYLENOL) 500 MG tablet Take 1 tablet (500 mg total) by mouth every 6 (six) hours as needed for mild pain. (Patient taking differently: Take 1,000 mg by mouth every 8 (eight) hours as needed for mild pain.) 30 tablet 0  ? alclomethasone (ACLOVATE) 0.05 % cream SMARTSIG:1 Topical Every Night    ? amoxicillin (AMOXIL) 500 MG capsule Take 2,000 mg by mouth once as needed (One hour prior to dental appointment).     ? aspirin 81 MG tablet Take 81 mg by mouth daily.    ? augmented betamethasone dipropionate (DIPROLENE-AF) 0.05 % ointment Apply 1 application topically daily as needed (itching).     ? Calcium Carbonate-Vit D-Min (CALTRATE 600+D PLUS MINERALS) 600-800 MG-UNIT CHEW Chew 2 each by mouth 2 (two) times daily.    ? cholecalciferol (VITAMIN D) 1000 UNITS tablet Take 1,000 Units by mouth daily.    ? citalopram (CELEXA) 40 MG tablet Take 40 mg by mouth daily.     ? Cyanocobalamin (VITAMIN B-12 PO) Take 1,000 mcg by mouth daily.    ? diazepam (VALIUM) 5 MG tablet Take 1 tablet (5 mg total) by mouth every 6 (six) hours as needed for anxiety (Take 30-min prior MRI). 1 tablet 0  ? diclofenac Sodium (VOLTAREN) 1 % GEL diclofenac 1 % topical gel    ? Difluprednate 0.05 % EMUL Place 2 drops into the right eye 2 (two) times daily.     ? ergocalciferol (VITAMIN D2) 1.25 MG (50000 UT) capsule     ? esomeprazole (NEXIUM) 40 MG capsule Take 40 mg by mouth 2 (two) times daily before a meal.    ? famciclovir (FAMVIR) 125 MG tablet     ? L-Methylfolate-Algae-B12-B6 (METANX) 3-90.314-2-35 MG CAPS     ? levothyroxine (SYNTHROID, LEVOTHROID) 75 MCG tablet Take 75 mcg by mouth daily before breakfast.     ? lovastatin (MEVACOR) 20 MG tablet Take 20 mg by mouth daily.     ? magnesium oxide (MAG-OX) 400 MG tablet Take 400 mg by mouth daily.    ? metoprolol succinate (TOPROL-XL) 25 MG 24 hr tablet Take 25 mg by mouth daily.     ? mirabegron ER (MYRBETRIQ) 25 MG TB24 tablet  Myrbetriq 25 mg tablet,extended release    ? moxifloxacin (VIGAMOX) 0.5 % ophthalmic solution moxifloxacin 0.5 % eye drops    ? Multiple Vitamins-Minerals (MULTIVITAMINS THER. W/MINERALS) TABS Take 1 tablet by mouth daily.    ? nystatin ointment (MYCOSTATIN) nystatin 100,000 unit/gram topical ointment    ? nystatin-triamcinolone ointment (MYCOLOG) Apply 1 application topically daily as needed (itching).     ? Polyethyl Glycol-Propyl Glycol 0.4-0.3 % SOLN Apply 2 drops to eye 2 (two) times daily as needed (for dry eyes).    ? predniSONE (DELTASONE) 10 MG tablet Take 1 tablet (10 mg total) by mouth daily with breakfast. 90 tablet 0  ? pregabalin (LYRICA) 50 MG capsule     ? pregabalin (LYRICA) 50 MG capsule Take 1 capsule by mouth 2 (two) times daily.    ? triamcinolone ointment (KENALOG) 0.1 % Apply 1 application topically 2 (two) times daily.    ? ?No current facility-administered medications for this visit.  ? ? ?  SUMMARY OF ONCOLOGIC HISTORY: ? ?Darlene [name] was transferred to my care after her prior physician has left.  ?I reviewed the patient's records extensive and collaborated the history with the patient. Summary of her history is as follows: ?This patient was diagnosed with a remote history of limited stage low grade B-cell non-Hodgkin's lymphoma, presenting with left supraclavicular lymphadenopathy in April 1999. She never required any systemic treatment. 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 This has also resolved on its own. She has never developed any additional adenopathy other than the single lymph node palpable in the left supraclavicular fossa.  ?She did develop a second primary stage I,  0.8 cm,  ER negative cancer of the right breast, treated with lumpectomy, radiation and six cycles of CMF chemotherapy in 1994. No evidence of a new disease from the breast cancer either. Most recent mammogram done in June 2015 showed stable  fibroglandular changes in her breasts. She was called back for an ultrasound of the left breast which also came back benign. ?She had a CT scan of the abdomen done on 11/14/15 because of significant intentional weight

## 2021-08-07 NOTE — Telephone Encounter (Signed)
Received New start notification for  Promacta '50mg'$  . Will update as we work through the benefits process. ? ?Submitted a Prior Authorization request to [facility] for  Promacta '50mg'$   via CoverMyMeds. Will update once we receive a response. ? ? ?Key: [id number] - PA Case ID: [id number] ? ?

## 2021-08-07 NOTE — Assessment & Plan Note (Signed)
Even though she has persistent lymphadenopathy in the para-aortic region, the size of the lymph nodes are stable over the course of the last 4 years ?I do not believe she has recurrent lymphoma that contributed to her ITP ?She does not need long-term imaging or follow-up in that regard ?

## 2021-08-07 NOTE — Assessment & Plan Note (Signed)
She have signs of iron deficiency ?I recommend oral iron supplement ?Her last colonoscopy in 2015 was normal ?It is not clear that she is compliant taking proton pump inhibitor twice daily ?I recommend GI consult for EGD evaluation and she is in agreement ?

## 2021-08-07 NOTE — Telephone Encounter (Signed)
Patient has [employer] prescription insurance, she should be able to use copay card. ? ? ?

## 2021-08-07 NOTE — Telephone Encounter (Signed)
Oral Oncology Pharmacist Encounter ? ?Received new prescription for eltrombopag (Promacta) for the treatment of chronic ITP, planned duration until disease progression or unacceptable toxicity. Prescription dose and frequency assessed. ? ?Labs from 08/07/2021 assessed, no interventions needed. ? ?Current medication list in Epic reviewed, DDIs with Promacta identified:  ?- lovastatin: will need to monitor for increased adverse effects ? ?Evaluated chart and no patient barriers to medication adherence noted.  ? ?Patient agreement for treatment documented in MD note on 08/07/2021. ? ?Prescription has been e-scribed to the [facility] for benefits analysis and approval. ? ?Oral Oncology Clinic will continue to follow for insurance authorization, copayment issues, initial counseling and start date. ? ?Drema Halon, PharmD ?Hematology/Oncology Clinical Pharmacist ?[facility] ?[phone] ?08/07/2021 2:26 PM ? ? ?

## 2021-08-08 NOTE — Telephone Encounter (Signed)
Received a fax regarding Prior Authorization from [place] for  Promacta . Authorization has been DENIED because Current plan approved criteria does not allow coverage of Promacta for chronic or persistent primary immune thrombocytopenia unless all of the following conditions are met: a) the patient has a documented platelet count at the time of diagnosis, b) that platelet count before transfusion is 50,000/mcL (50x10^9/L) or less, and c) the test result is submitted. Additional coverage criteria may apply, please review policy or plan ?documents for full requirements. ? ?Denial forwarded to pharmacist. ? ?

## 2021-08-11 ENCOUNTER — Telehealth: Payer: Self-pay | Admitting: Hematology and Oncology

## 2021-08-11 NOTE — Telephone Encounter (Signed)
I received notification from pharmacy team that despite a letter of appeal, we are not able to get her insurance to approve Promacta, unless her platelet count dropped to less than 50,000 ?I called the patient and reviewed the plan of care ?I recommend her current prednisone dose to 5 mg daily and reassess next week ?I tried to call her daughter but unable to get hold of her but I reviewed the plan of care with the patient and she is in agreement ?Her appointment will be scheduled on April 21 for further follow-up and evaluation ?She expressed verbal understanding ?

## 2021-08-15 ENCOUNTER — Telehealth: Payer: Self-pay

## 2021-08-15 NOTE — Telephone Encounter (Signed)
Called and scheduled appt with GI for 5/2 at 1130 am, arrive at 1115 for appt with [facility], [facility]. ?Called and given appt details to daughter. She verbalized understanding. ?

## 2021-08-22 ENCOUNTER — Inpatient Hospital Stay: Payer: Medicare Other

## 2021-08-22 ENCOUNTER — Other Ambulatory Visit: Payer: Self-pay

## 2021-08-22 ENCOUNTER — Encounter: Payer: Self-pay | Admitting: Hematology and Oncology

## 2021-08-22 ENCOUNTER — Inpatient Hospital Stay (HOSPITAL_BASED_OUTPATIENT_CLINIC_OR_DEPARTMENT_OTHER): Payer: Medicare Other | Admitting: Hematology and Oncology

## 2021-08-22 DIAGNOSIS — Z8572 Personal history of non-Hodgkin lymphomas: Secondary | ICD-10-CM | POA: Diagnosis not present

## 2021-08-22 DIAGNOSIS — D693 Immune thrombocytopenic purpura: Secondary | ICD-10-CM

## 2021-08-22 DIAGNOSIS — D638 Anemia in other chronic diseases classified elsewhere: Secondary | ICD-10-CM

## 2021-08-22 DIAGNOSIS — I517 Cardiomegaly: Secondary | ICD-10-CM | POA: Diagnosis not present

## 2021-08-22 DIAGNOSIS — Z87828 Personal history of other (healed) physical injury and trauma: Secondary | ICD-10-CM | POA: Diagnosis not present

## 2021-08-22 DIAGNOSIS — E611 Iron deficiency: Secondary | ICD-10-CM | POA: Diagnosis not present

## 2021-08-22 DIAGNOSIS — Z853 Personal history of malignant neoplasm of breast: Secondary | ICD-10-CM | POA: Diagnosis not present

## 2021-08-22 LAB — CBC WITH DIFFERENTIAL/PLATELET
Abs Immature Granulocytes: 0.04 10*3/uL (ref 0.00–0.07)
Basophils Absolute: 0.1 10*3/uL (ref 0.0–0.1)
Basophils Relative: 1 %
Eosinophils Absolute: 0.1 10*3/uL (ref 0.0–0.5)
Eosinophils Relative: 1 %
HCT: 34.5 % — ABNORMAL LOW (ref 36.0–46.0)
Hemoglobin: 10.9 g/dL — ABNORMAL LOW (ref 12.0–15.0)
Immature Granulocytes: 0 %
Lymphocytes Relative: 12 %
Lymphs Abs: 1.2 10*3/uL (ref 0.7–4.0)
MCH: 25.7 pg — ABNORMAL LOW (ref 26.0–34.0)
MCHC: 31.6 g/dL (ref 30.0–36.0)
MCV: 81.4 fL (ref 80.0–100.0)
Monocytes Absolute: 0.5 10*3/uL (ref 0.1–1.0)
Monocytes Relative: 5 %
Neutro Abs: 8.2 10*3/uL — ABNORMAL HIGH (ref 1.7–7.7)
Neutrophils Relative %: 81 %
Platelets: 112 10*3/uL — ABNORMAL LOW (ref 150–400)
RBC: 4.24 MIL/uL (ref 3.87–5.11)
RDW: 17.2 % — ABNORMAL HIGH (ref 11.5–15.5)
WBC: 10.2 10*3/uL (ref 4.0–10.5)
nRBC: 0 % (ref 0.0–0.2)

## 2021-08-22 MED ORDER — PREDNISONE 10 MG PO TABS: 5.0000 mg | ORAL_TABLET | Freq: Every day | ORAL | 0 refills | Status: DC

## 2021-08-22 NOTE — Assessment & Plan Note (Addendum)
She have signs of iron deficiency ?I recommend oral iron supplement ?Her last colonoscopy in 2015 was normal ?It is not clear that she is compliant taking proton pump inhibitor twice daily ?I recommend GI consult for EGD evaluation and she is in agreement ?GI appointment is pending next month ?

## 2021-08-22 NOTE — Assessment & Plan Note (Signed)
She has skin laceration on her left forearm ?I placed a clean dressing over it ?It looks well-healed and does not appear to be infected ?

## 2021-08-22 NOTE — Assessment & Plan Note (Signed)
Unfortunately, we are not able to get insurance approval for Promacta despite letters of appeal ?She is tolerating reduced dose prednisone well ?Recommend she continues at 5 mg daily ?I reminded her to bring her pill bottles next visit ?Her insurance company mandated that her platelet count has dropped under 50,000 before they will approve the use of Promacta ?I will continue to monitor this closely ?

## 2021-08-22 NOTE — Progress Notes (Signed)
[place] ?OFFICE PROGRESS NOTE ? ?Patient Care Team: ?Holland Commons, FNP as PCP - General (Internal Medicine) ? ?ASSESSMENT & PLAN:  ?Chronic ITP (idiopathic thrombocytopenia) (HCC) ?Unfortunately, we are not able to get insurance approval for Promacta despite letters of appeal ?She is tolerating reduced dose prednisone well ?Recommend she continues at 5 mg daily ?I reminded her to bring her pill bottles next visit ?Her insurance company mandated that her platelet count has dropped under 50,000 before they will approve the use of Promacta ?I will continue to monitor this closely ? ?Anemia, chronic disease ?She have signs of iron deficiency ?I recommend oral iron supplement ?Her last colonoscopy in 2015 was normal ?It is not clear that she is compliant taking proton pump inhibitor twice daily ?I recommend GI consult for EGD evaluation and she is in agreement ?GI appointment is pending next month ? ?History of laceration of skin ?She has skin laceration on her left forearm ?I placed a clean dressing over it ?It looks well-healed and does not appear to be infected ? ?No orders of the defined types were placed in this encounter. ? ? ?All questions were answered. The patient knows to call the clinic with any problems, questions or concerns. ?The total time spent in the appointment was 20 minutes encounter with patients including review of chart and various tests results, discussions about plan of care and coordination of care plan ?  ?Heath Lark, MD ?08/22/2021 1:14 PM ? ?INTERVAL HISTORY: ?Please see below for problem oriented charting. ?she returns for treatment follow-up on prednisone for ITP ?She did not bring her pill bottles but verifies she is only taking 5 mg of prednisone ?She had accidental injury to her left forearm with skin laceration ?She has been cleaning it and placed Neosporin ointment over it ?No fever or chills ?Denies excessive bleeding ? ?REVIEW OF SYSTEMS:   ?Constitutional:  Denies fevers, chills or abnormal weight loss ?Eyes: Denies blurriness of vision ?Ears, nose, mouth, throat, and face: Denies mucositis or sore throat ?Respiratory: Denies cough, dyspnea or wheezes ?Cardiovascular: Denies palpitation, chest discomfort or lower extremity swelling ?Gastrointestinal:  Denies nausea, heartburn or change in bowel habits ?Skin: Denies abnormal skin rashes ?Lymphatics: Denies new lymphadenopathy or easy bruising ?Neurological:Denies numbness, tingling or new weaknesses ?Behavioral/Psych: Mood is stable, no new changes  ?All other systems were reviewed with the patient and are negative. ? ?I have reviewed the past medical history, past surgical history, social history and family history with the patient and they are unchanged from previous note. ? ?ALLERGIES:  is allergic to oxycodone, sulfamethoxazole-trimethoprim, codeine, doxycycline, meperidine and related, sulfa antibiotics, and sulfacetamide sodium. ? ?MEDICATIONS:  ?Current Outpatient Medications  ?Medication Sig Dispense Refill  ? acetaminophen (TYLENOL) 500 MG tablet Take 1 tablet (500 mg total) by mouth every 6 (six) hours as needed for mild pain. (Patient taking differently: Take 1,000 mg by mouth every 8 (eight) hours as needed for mild pain.) 30 tablet 0  ? alclomethasone (ACLOVATE) 0.05 % cream SMARTSIG:1 Topical Every Night    ? amoxicillin (AMOXIL) 500 MG capsule Take 2,000 mg by mouth once as needed (One hour prior to dental appointment).     ? aspirin 81 MG tablet Take 81 mg by mouth daily.    ? augmented betamethasone dipropionate (DIPROLENE-AF) 0.05 % ointment Apply 1 application topically daily as needed (itching).     ? Calcium Carbonate-Vit D-Min (CALTRATE 600+D PLUS MINERALS) 600-800 MG-UNIT CHEW Chew 2 each by mouth 2 (two) times  daily.    ? cholecalciferol (VITAMIN D) 1000 UNITS tablet Take 1,000 Units by mouth daily.    ? citalopram (CELEXA) 40 MG tablet Take 40 mg by mouth daily.     ? Cyanocobalamin (VITAMIN  B-12 PO) Take 1,000 mcg by mouth daily.    ? diazepam (VALIUM) 5 MG tablet Take 1 tablet (5 mg total) by mouth every 6 (six) hours as needed for anxiety (Take 30-min prior MRI). 1 tablet 0  ? diclofenac Sodium (VOLTAREN) 1 % GEL diclofenac 1 % topical gel    ? Difluprednate 0.05 % EMUL Place 2 drops into the right eye 2 (two) times daily.     ? ergocalciferol (VITAMIN D2) 1.25 MG (50000 UT) capsule     ? esomeprazole (NEXIUM) 40 MG capsule Take 40 mg by mouth 2 (two) times daily before a meal.    ? famciclovir (FAMVIR) 125 MG tablet     ? L-Methylfolate-Algae-B12-B6 (METANX) 3-90.314-2-35 MG CAPS     ? levothyroxine (SYNTHROID, LEVOTHROID) 75 MCG tablet Take 75 mcg by mouth daily before breakfast.     ? lovastatin (MEVACOR) 20 MG tablet Take 20 mg by mouth daily.     ? magnesium oxide (MAG-OX) 400 MG tablet Take 400 mg by mouth daily.    ? metoprolol succinate (TOPROL-XL) 25 MG 24 hr tablet Take 25 mg by mouth daily.     ? mirabegron ER (MYRBETRIQ) 25 MG TB24 tablet Myrbetriq 25 mg tablet,extended release    ? Multiple Vitamins-Minerals (MULTIVITAMINS THER. W/MINERALS) TABS Take 1 tablet by mouth daily.    ? nystatin ointment (MYCOSTATIN) nystatin 100,000 unit/gram topical ointment    ? nystatin-triamcinolone ointment (MYCOLOG) Apply 1 application topically daily as needed (itching).     ? Polyethyl Glycol-Propyl Glycol 0.4-0.3 % SOLN Apply 2 drops to eye 2 (two) times daily as needed (for dry eyes).    ? predniSONE (DELTASONE) 10 MG tablet Take 0.5 tablets (5 mg total) by mouth daily with breakfast. 90 tablet 0  ? pregabalin (LYRICA) 50 MG capsule     ? pregabalin (LYRICA) 50 MG capsule Take 1 capsule by mouth 2 (two) times daily.    ? triamcinolone ointment (KENALOG) 0.1 % Apply 1 application topically 2 (two) times daily.    ? ?No current facility-administered medications for this visit.  ? ? ?SUMMARY OF ONCOLOGIC HISTORY: ?Oncology History  ? No history exists.  ? ? ?PHYSICAL EXAMINATION: ?ECOG PERFORMANCE  STATUS: 1 - Symptomatic but completely ambulatory ? ?Vitals:  ? 08/22/21 1205  ?BP: 125/79  ?Pulse: 72  ?Resp: 16  ?Temp: 98.2 ?F (36.8 ?C)  ?SpO2: 97%  ? ?Filed Weights  ? 08/22/21 1205  ?Weight: 175 lb 6.4 oz (79.6 kg)  ? ? ?GENERAL:alert, no distress and comfortable ?SKIN: She has 1 inch laceration on the left forearm.  The edges appear clean.  I placed a clean dressing over it ?NEURO: alert & oriented x 3 with fluent speech, no focal motor/sensory deficits ? ?LABORATORY DATA:  ?I have reviewed the data as listed ?   ?Component Value Date/Time  ? NA 138 07/03/2021 1153  ? NA 135 (L) 07/07/2016 1035  ? K 4.0 07/03/2021 1153  ? K 4.8 07/07/2016 1035  ? CL 107 07/03/2021 1153  ? CL 106 07/11/2012 0906  ? CO2 24 07/03/2021 1153  ? CO2 24 07/07/2016 1035  ? GLUCOSE 81 07/03/2021 1153  ? GLUCOSE 85 07/07/2016 1035  ? GLUCOSE 107 (H) 07/11/2012 0906  ? BUN 15  07/03/2021 1153  ? BUN 19.9 07/07/2016 1035  ? CREATININE 0.64 07/03/2021 1153  ? CREATININE 0.7 07/07/2016 1035  ? CALCIUM 10.0 07/03/2021 1153  ? CALCIUM 10.2 07/07/2016 1035  ? PROT 6.6 07/03/2021 1153  ? PROT 6.6 07/07/2016 1035  ? ALBUMIN 4.1 07/03/2021 1153  ? ALBUMIN 3.9 07/07/2016 1035  ? AST 15 07/03/2021 1153  ? AST 14 07/07/2016 1035  ? ALT 14 07/03/2021 1153  ? ALT 17 07/07/2016 1035  ? ALKPHOS 58 07/03/2021 1153  ? ALKPHOS 58 07/07/2016 1035  ? BILITOT 0.9 07/03/2021 1153  ? BILITOT 0.61 07/07/2016 1035  ? GFRNONAA >60 07/03/2021 1153  ? GFRAA >60 05/19/2018 1551  ? ? ?No results found for: SPEP, UPEP ? ?Lab Results  ?Component Value Date  ? WBC 10.2 08/22/2021  ? NEUTROABS 8.2 (H) 08/22/2021  ? HGB 10.9 (L) 08/22/2021  ? HCT 34.5 (L) 08/22/2021  ? MCV 81.4 08/22/2021  ? PLT 112 (L) 08/22/2021  ? ? ?  Chemistry   ?   ?Component Value Date/Time  ? NA 138 07/03/2021 1153  ? NA 135 (L) 07/07/2016 1035  ? K 4.0 07/03/2021 1153  ? K 4.8 07/07/2016 1035  ? CL 107 07/03/2021 1153  ? CL 106 07/11/2012 0906  ? CO2 24 07/03/2021 1153  ? CO2 24 07/07/2016 1035  ?  BUN 15 07/03/2021 1153  ? BUN 19.9 07/07/2016 1035  ? CREATININE 0.64 07/03/2021 1153  ? CREATININE 0.7 07/07/2016 1035  ?    ?Component Value Date/Time  ? CALCIUM 10.0 07/03/2021 1153  ? CALCIUM 10.2 07/08/18

## 2021-08-25 DIAGNOSIS — H6123 Impacted cerumen, bilateral: Secondary | ICD-10-CM | POA: Diagnosis not present

## 2021-08-27 NOTE — Telephone Encounter (Signed)
Oral Oncology Pharmacist Encounter ? ?Letter of appeal was sent in with additional information to support use of Promacta for chronic ITP. Appeal was denied until patients platelet count drops below 50,000. Provider was notified.  ? ?Will keep encounter open as it is likely patients platelet count will decrease due to lower dose of prednisone.  ? ?Drema Halon, PharmD ?Hematology/Oncology Clinical Pharmacist ?[facility] ?[phone] ? ?

## 2021-08-28 ENCOUNTER — Telehealth: Payer: Self-pay | Admitting: Hematology and Oncology

## 2021-08-28 NOTE — Telephone Encounter (Signed)
Per 4/26 in basket called and spoke to pt.  Pt confirmed appointment  ?

## 2021-09-01 NOTE — Telephone Encounter (Signed)
Oral Oncology Pharmacist Encounter ? ?Patient is continuing prednisone until platelet count decreases. Will close encounter for now and if patient is in need of promacta in the future, a new encounter will be opened.  ? ?Drema Halon, PharmD ?Hematology/Oncology Clinical Pharmacist ?[facility] ?[phone] ? ?

## 2021-09-02 ENCOUNTER — Encounter: Payer: Self-pay | Admitting: Nurse Practitioner

## 2021-09-02 ENCOUNTER — Ambulatory Visit (INDEPENDENT_AMBULATORY_CARE_PROVIDER_SITE_OTHER): Payer: Medicare Other | Admitting: Nurse Practitioner

## 2021-09-02 VITALS — BP 118/72 | HR 68 | Ht 58.5 in | Wt 173.4 lb

## 2021-09-02 DIAGNOSIS — D509 Iron deficiency anemia, unspecified: Secondary | ICD-10-CM | POA: Diagnosis not present

## 2021-09-02 NOTE — Progress Notes (Signed)
? ? ? ?09/02/2021 ?Darlene [name] ?[id number] ?04-24-1935 ? ? ?CHIEF COMPLAINT: Anemia  ? ?HISTORY OF PRESENT ILLNESS: Darlene [name] is an 87 year old female with a past medical history significant for anxiety, depression, B cell lymphoma, breast cancer 2013, renal cancer, idiopathic thrombocytopenia, COPD, GERD, constipation and colon polyps. She presents to our office today as referred by Dr. Alvy Bimler for further evaluation regarding IDA and to consider EGD evaluation.  She is accompanied by her daughter.  She denies having any dysphagia, heartburn or upper abdominal pain.  She is passing normal formed brown stool daily.  No rectal bleeding or black stools.  She underwent a colonoscopy by Dr. Carlean Purl 01/09/2014 which was normal.  Her most recent laboratory studies 08/22/2021 showed a hemoglobin level of 10.9.  Hematocrit 34.5.  Platelet 112.  She started taking elemental iron 65 mg 1 tab 1 month ago.  Her appetite is good.  No weight loss.  No fevers.  Has a history of COPD, denies shortness of breath.  She does not require home oxygen.  She denies any history of coronary artery disease.  No chest pain or palpitations.  History of idiopathic thrombocytopenia.  Abd/pelvic CT 08/04/2021 showed a normal liver and spleen. ? ? ? ?  Latest Ref Rng & Units 08/22/2021  ? 11:40 AM 08/07/2021  ? 12:34 PM 07/28/2021  ?  1:21 PM  ?CBC  ?WBC 4.0 - 10.5 K/uL 10.2   9.8   10.3    ?Hemoglobin 12.0 - 15.0 g/dL 10.9   10.4   10.7    ?Hematocrit 36.0 - 46.0 % 34.5   33.0   34.0    ?Platelets 150 - 400 K/uL 112   143   68    ?  ? ? ?  Latest Ref Rng & Units 07/03/2021  ? 11:53 AM 05/19/2018  ?  3:51 PM 01/18/2018  ? 12:19 PM  ?CMP  ?Glucose 70 - 99 mg/dL 81   89   100    ?BUN 8 - 23 mg/dL '15   13   17    '$ ?Creatinine 0.44 - 1.00 mg/dL 0.64   0.62   0.64    ?Sodium 135 - 145 mmol/L 138   136   141    ?Potassium 3.5 - 5.1 mmol/L 4.0   4.0   4.5    ?Chloride 98 - 111 mmol/L 107   105   106    ?CO2 22 - 32 mmol/L '24   21   26    '$ ?Calcium 8.9 - 10.3  mg/dL 10.0   9.6   10.5    ?Total Protein 6.5 - 8.1 g/dL 6.6      ?Total Bilirubin 0.3 - 1.2 mg/dL 0.9      ?Alkaline Phos 38 - 126 U/L 58      ?AST 15 - 41 U/L 15      ?ALT 0 - 44 U/L 14      ?  ?Labs 07/03/2021: Iron 47.  TIBC 487.  Saturation ratios 10. ? ?Chest/abdomen/pelvic CT with contrast 08/04/2021: ?1. Persistent mild to moderate left periaortic adenopathy. There is ?a mildly prominent right submental lymph node (level Ia) along with ?some normal size thoracic lymph nodes. ?2. Chronic left clavicular and rib fractures. Chronic T9 compression ?fracture with 5 mm posterior bony retropulsion. ?3. Chronic multilevel lumbar impingement. ?4. Other imaging findings of potential clinical significance: Aortic ?Atherosclerosis (ICD10-I70.0). Coronary systemic atherosclerosis. ?Mild cardiomegaly ? ? ?Past Medical History:  ?Diagnosis Date  ?  Anxiety   ? Breast cancer, stage 1, estrogen receptor negative ([place]) 07/06/2011  ? Cancer of breast ([place])   ? Cataract   ? Chronic ITP (idiopathic thrombocytopenia) ([facility]) 07/06/2011  ? Constipation   ? takes Colace daily  ? COPD (chronic obstructive pulmonary disease) ([place])   ? mild  ? Depression   ? "nervous break down age 59"  ? Fast heart beat   ? GERD (gastroesophageal reflux disease)   ? takes Nexium daily  ? History of colon polyps   ? History of depression   ? received shock treatments   ? History of palpitations   ? takes Metoprolol daily  ? History of vertigo   ? HOH (hard of hearing)   ? has hearing aids  ? Hyperlipidemia   ? takes Lovastatin daily  ? Hypertension   ? Hypothyroid 07/06/2011  ? takes SYnthroid daily  ? Joint pain   ? Joint swelling   ? Lymphoma ([place])   ? Lymphoma ([place])   ? B cell   ? Lymphoma of lymph nodes of head, face, and/or neck ([place]) 07/06/2011  ? Neuropathy   ? Nocturia   ? Osteopenia   ? Osteoporosis   ? hx of and take Reclast  ? Peripheral neuropathy   ? Personal history of chemotherapy   ? Personal history of radiation therapy   ? Right knee DJD   ?  Shortness of breath dyspnea   ? with exertion  ? Thyroid disease   ? Umbilical hernia   ? ?Past Surgical History:  ?Procedure Laterality Date  ? ADENOIDECTOMY    ? as a child  ? BREAST LUMPECTOMY Right 1994  ? BREAST SURGERY Right   ? BUNIONECTOMY Right   ? CARDIAC CATHETERIZATION  2014  ? COLONOSCOPY    ? FOOT SURGERY Right   ? FRACTURE SURGERY    ? wrist right , right knee  ? INSERTION OF MESH N/A 04/17/2015  ? Procedure: INSERTION OF MESH;  Surgeon: Ralene Ok, MD;  Location: [facility];  Service: General;  Laterality: N/A;  ? KNEE SURGERY    ? LEFT HEART CATHETERIZATION WITH CORONARY ANGIOGRAM N/A 12/05/2012  ? Procedure: LEFT HEART CATHETERIZATION WITH CORONARY ANGIOGRAM;  Surgeon: [name] Sine, MD;  Location: [facility] CATH [facility];  Service: Cardiovascular;  Laterality: N/A;  ? right  breast surgery    ? d/t breast cancer  ? TONSILLECTOMY    ? as a child  ? TOTAL KNEE ARTHROPLASTY Right 06/19/2013  ? Procedure: TOTAL KNEE ARTHROPLASTY;  Surgeon: Lorn Junes, MD;  Location: [facility];  Service: Orthopedics;  Laterality: Right;  ? TRANSURETHRAL RESECTION OF BLADDER TUMOR WITH GYRUS (TURBT-GYRUS)    ? 01-26-18 Dr. Louis Meckel  ? TRANSURETHRAL RESECTION OF BLADDER TUMOR WITH MITOMYCIN-C Bilateral 01/26/2018  ? Procedure: TRANSURETHRAL RESECTION OF BLADDER TUMOR WITH GEMCITABIN BLLATERAL RETROGRADE PYELOGRAM;  Surgeon: Ardis Hughs, MD;  Location: [facility];  Service: Urology;  Laterality: Bilateral;  ? TURBINATE REDUCTION    ? UMBILICAL HERNIA REPAIR N/A 04/17/2015  ? Procedure: LAPAROSCOPIC UMBILICAL HERNIA REPAIR WITH MESH;  Surgeon: Ralene Ok, MD;  Location: [facility];  Service: General;  Laterality: N/A;  ? UPPER GASTROINTESTINAL ENDOSCOPY    ? ?Social History: She quit smoking cigarettes 38 years ago.  No alcohol use.  No drug use. ? ?Family History: family history includes Cancer in her brother; Diabetes in her son; Heart disease in her father, maternal grandfather, and sister; Hyperlipidemia in her father;  Hypertension in her  daughter; Mood Disorder in her daughter; Other in her mother. ? ?Allergies  ?Allergen Reactions  ? Oxycodone Other (See Comments)  ?  Made her depressed  ? Sulfamethoxazole-Trimethoprim   ?  Other reaction(s): Unknown  ? Codeine Itching  ?   ?   ?   ? Doxycycline Swelling and Other (See Comments)  ?  Burns throat also  ? Meperidine And Related Itching and Rash  ?  Makes cheeks red also  ? Sulfa Antibiotics Itching  ? Sulfacetamide Sodium Itching  ? ? ?  ?Outpatient Encounter Medications as of 09/02/2021  ?Medication Sig  ? acetaminophen (TYLENOL) 500 MG tablet Take 1 tablet (500 mg total) by mouth every 6 (six) hours as needed for mild pain. (Patient taking differently: Take 1,000 mg by mouth every 8 (eight) hours as needed for mild pain.)  ? alclomethasone (ACLOVATE) 0.05 % cream SMARTSIG:1 Topical Every Night  ? aspirin 81 MG tablet Take 81 mg by mouth daily.  ? augmented betamethasone dipropionate (DIPROLENE-AF) 0.05 % ointment Apply 1 application topically daily as needed (itching).   ? Calcium Carbonate-Vit D-Min (CALTRATE 600+D PLUS MINERALS) 600-800 MG-UNIT CHEW Chew 2 each by mouth 2 (two) times daily.  ? cholecalciferol (VITAMIN D) 1000 UNITS tablet Take 1,000 Units by mouth daily.  ? citalopram (CELEXA) 40 MG tablet Take 40 mg by mouth daily.   ? Coenzyme Q10 (CO Q-10 PO) Take 1 tablet by mouth daily.  ? Cyanocobalamin (VITAMIN B-12 PO) Take 1,000 mcg by mouth daily.  ? diazepam (VALIUM) 5 MG tablet Take 1 tablet (5 mg total) by mouth every 6 (six) hours as needed for anxiety (Take 30-min prior MRI).  ? diclofenac Sodium (VOLTAREN) 1 % GEL diclofenac 1 % topical gel  ? Difluprednate 0.05 % EMUL Place 2 drops into the right eye 2 (two) times daily.   ? ergocalciferol (VITAMIN D2) 1.25 MG (50000 UT) capsule   ? esomeprazole (NEXIUM) 40 MG capsule Take 40 mg by mouth 2 (two) times daily before a meal.  ? famciclovir (FAMVIR) 125 MG tablet   ? Iron, Ferrous Sulfate, 325 (65 Fe) MG TABS Take 1  tablet by mouth daily.  ? L-Methylfolate-Algae-B12-B6 (METANX) 3-90.314-2-35 MG CAPS   ? levothyroxine (SYNTHROID, LEVOTHROID) 75 MCG tablet Take 75 mcg by mouth daily before breakfast.   ? lovastatin (MEVACOR) 20 M

## 2021-09-02 NOTE — Patient Instructions (Addendum)
You have been scheduled for a EGD. Please follow the written instructions given to you at your visit today. ?If you use inhalers (even only as needed), please bring them with you on the day of your procedure. ? ?Continue to follow up with Dr. Alvy Bimler including your CBC and iron studies. ? ?Thank you for trusting me with your gastrointestinal care!   ? ?Noralyn Pick, CRNP ? ? ? ?BMI: ? ?If you are age 85 or older, your body mass index should be between 23-30. Your Body mass index is 35.62 kg/m?Marland Kitchen If this is out of the aforementioned range listed, please consider follow up with your Primary Care Provider. ? ?If you are age 40 or younger, your body mass index should be between 19-25. Your Body mass index is 35.62 kg/m?Marland Kitchen If this is out of the aformentioned range listed, please consider follow up with your Primary Care Provider.  ? ?MY CHART: ? ?The [facility] providers would like to encourage you to use [facility] to communicate with providers for non-urgent requests or questions.  Due to long hold times on the telephone, sending your provider a message by [employer] may be a faster and more efficient way to get a response.  Please allow 48 business hours for a response.  Please remember that this is for non-urgent requests.  ? ?

## 2021-09-05 ENCOUNTER — Other Ambulatory Visit: Payer: Medicare Other

## 2021-09-05 ENCOUNTER — Ambulatory Visit: Payer: Medicare Other | Admitting: Hematology and Oncology

## 2021-09-23 ENCOUNTER — Inpatient Hospital Stay: Payer: Medicare Other | Attending: Hematology and Oncology

## 2021-09-23 ENCOUNTER — Encounter: Payer: Self-pay | Admitting: Hematology and Oncology

## 2021-09-23 ENCOUNTER — Inpatient Hospital Stay (HOSPITAL_BASED_OUTPATIENT_CLINIC_OR_DEPARTMENT_OTHER): Payer: Medicare Other | Admitting: Hematology and Oncology

## 2021-09-23 ENCOUNTER — Other Ambulatory Visit: Payer: Self-pay

## 2021-09-23 DIAGNOSIS — D638 Anemia in other chronic diseases classified elsewhere: Secondary | ICD-10-CM

## 2021-09-23 DIAGNOSIS — F419 Anxiety disorder, unspecified: Secondary | ICD-10-CM | POA: Insufficient documentation

## 2021-09-23 DIAGNOSIS — Z833 Family history of diabetes mellitus: Secondary | ICD-10-CM | POA: Insufficient documentation

## 2021-09-23 DIAGNOSIS — Z7952 Long term (current) use of systemic steroids: Secondary | ICD-10-CM | POA: Diagnosis not present

## 2021-09-23 DIAGNOSIS — Z9221 Personal history of antineoplastic chemotherapy: Secondary | ICD-10-CM | POA: Diagnosis not present

## 2021-09-23 DIAGNOSIS — T148XXA Other injury of unspecified body region, initial encounter: Secondary | ICD-10-CM | POA: Insufficient documentation

## 2021-09-23 DIAGNOSIS — D693 Immune thrombocytopenic purpura: Secondary | ICD-10-CM

## 2021-09-23 DIAGNOSIS — R350 Frequency of micturition: Secondary | ICD-10-CM | POA: Diagnosis not present

## 2021-09-23 DIAGNOSIS — Z885 Allergy status to narcotic agent status: Secondary | ICD-10-CM | POA: Insufficient documentation

## 2021-09-23 DIAGNOSIS — Z809 Family history of malignant neoplasm, unspecified: Secondary | ICD-10-CM | POA: Diagnosis not present

## 2021-09-23 DIAGNOSIS — Z853 Personal history of malignant neoplasm of breast: Secondary | ICD-10-CM | POA: Insufficient documentation

## 2021-09-23 DIAGNOSIS — J449 Chronic obstructive pulmonary disease, unspecified: Secondary | ICD-10-CM | POA: Diagnosis not present

## 2021-09-23 DIAGNOSIS — E785 Hyperlipidemia, unspecified: Secondary | ICD-10-CM | POA: Insufficient documentation

## 2021-09-23 DIAGNOSIS — Z8249 Family history of ischemic heart disease and other diseases of the circulatory system: Secondary | ICD-10-CM | POA: Insufficient documentation

## 2021-09-23 DIAGNOSIS — Z79899 Other long term (current) drug therapy: Secondary | ICD-10-CM | POA: Insufficient documentation

## 2021-09-23 DIAGNOSIS — Z882 Allergy status to sulfonamides status: Secondary | ICD-10-CM | POA: Diagnosis not present

## 2021-09-23 DIAGNOSIS — Z8489 Family history of other specified conditions: Secondary | ICD-10-CM | POA: Diagnosis not present

## 2021-09-23 DIAGNOSIS — Z8349 Family history of other endocrine, nutritional and metabolic diseases: Secondary | ICD-10-CM | POA: Diagnosis not present

## 2021-09-23 DIAGNOSIS — Z923 Personal history of irradiation: Secondary | ICD-10-CM | POA: Insufficient documentation

## 2021-09-23 DIAGNOSIS — Z881 Allergy status to other antibiotic agents status: Secondary | ICD-10-CM | POA: Insufficient documentation

## 2021-09-23 DIAGNOSIS — Z8719 Personal history of other diseases of the digestive system: Secondary | ICD-10-CM | POA: Insufficient documentation

## 2021-09-23 DIAGNOSIS — Z87891 Personal history of nicotine dependence: Secondary | ICD-10-CM | POA: Diagnosis not present

## 2021-09-23 LAB — CBC WITH DIFFERENTIAL/PLATELET
Abs Immature Granulocytes: 0.04 10*3/uL (ref 0.00–0.07)
Basophils Absolute: 0.1 10*3/uL (ref 0.0–0.1)
Basophils Relative: 1 %
Eosinophils Absolute: 0.1 10*3/uL (ref 0.0–0.5)
Eosinophils Relative: 1 %
HCT: 37 % (ref 36.0–46.0)
Hemoglobin: 11.9 g/dL — ABNORMAL LOW (ref 12.0–15.0)
Immature Granulocytes: 0 %
Lymphocytes Relative: 12 %
Lymphs Abs: 1.3 10*3/uL (ref 0.7–4.0)
MCH: 27 pg (ref 26.0–34.0)
MCHC: 32.2 g/dL (ref 30.0–36.0)
MCV: 83.9 fL (ref 80.0–100.0)
Monocytes Absolute: 0.4 10*3/uL (ref 0.1–1.0)
Monocytes Relative: 4 %
Neutro Abs: 8.4 10*3/uL — ABNORMAL HIGH (ref 1.7–7.7)
Neutrophils Relative %: 82 %
Platelets: 121 10*3/uL — ABNORMAL LOW (ref 150–400)
RBC: 4.41 MIL/uL (ref 3.87–5.11)
RDW: 17.4 % — ABNORMAL HIGH (ref 11.5–15.5)
WBC: 10.3 10*3/uL (ref 4.0–10.5)
nRBC: 0 % (ref 0.0–0.2)

## 2021-09-23 MED ORDER — PREDNISONE 2.5 MG PO TABS
ORAL_TABLET | ORAL | 1 refills | Status: DC
Start: 2021-09-23 — End: 2021-10-27

## 2021-09-23 NOTE — Assessment & Plan Note (Signed)
Unfortunately, we are not able to get insurance approval for Promacta despite letters of appeal She is tolerating reduced dose prednisone well She has excessive bruising I do not think it is feasible to keep her on prednisone long-term I will attempt another taper She is given verbal and written instruction with new prescription sent to her pharmacy: Starting tomorrow, she will take 2.5 mg daily on Mondays, Wednesdays and Fridays and to take 5 mg for the rest of the week I will continue to monitor this closely I will see her again in a month for further follow-up

## 2021-09-23 NOTE — Progress Notes (Signed)
[facility] OFFICE PROGRESS NOTE  Patient Care Team: Holland Commons, FNP as PCP - General (Internal Medicine)  ASSESSMENT & PLAN:  Chronic ITP (idiopathic thrombocytopenia) ([place]) Unfortunately, we are not able to get insurance approval for Promacta despite letters of appeal She is tolerating reduced dose prednisone well She has excessive bruising I do not think it is feasible to keep her on prednisone long-term I will attempt another taper She is given verbal and written instruction with new prescription sent to her pharmacy: Starting tomorrow, she will take 2.5 mg daily on Mondays, Wednesdays and Fridays and to take 5 mg for the rest of the week I will continue to monitor this closely I will see her again in a month for further follow-up  Anemia, chronic disease She has multifactorial anemia including component of iron deficiency She is tolerating oral iron supplement well I have reviewed GI referral notes I agree with her to proceed with EGD   No orders of the defined types were placed in this encounter.   All questions were answered. The patient knows to call the clinic with any problems, questions or concerns. The total time spent in the appointment was 20 minutes encounter with patients including review of chart and various tests results, discussions about plan of care and coordination of care plan   Darlene Lark, MD 09/23/2021 12:15 PM  INTERVAL HISTORY: Please see below for problem oriented charting. she returns for treatment follow-up She is taking 5 mg of prednisone daily She bruises easily The patient denies any recent signs or symptoms of bleeding such as spontaneous epistaxis, hematuria or hematochezia. She was seen by gastroenterologist recently with plan for EGD evaluation  REVIEW OF SYSTEMS:   Constitutional: Denies fevers, chills or abnormal weight loss Skin: Noted skin bruising All other systems were reviewed with the patient and are  negative.  I have reviewed the past medical history, past surgical history, social history and family history with the patient and they are unchanged from previous note.  ALLERGIES:  is allergic to oxycodone, sulfamethoxazole-trimethoprim, codeine, doxycycline, meperidine and related, sulfa antibiotics, and sulfacetamide sodium.  MEDICATIONS:  Current Outpatient Medications  Medication Sig Dispense Refill   acetaminophen (TYLENOL) 500 MG tablet Take 1 tablet (500 mg total) by mouth every 6 (six) hours as needed for mild pain. (Patient taking differently: Take 1,000 mg by mouth every 8 (eight) hours as needed for mild pain.) 30 tablet 0   alclomethasone (ACLOVATE) 0.05 % cream SMARTSIG:1 Topical Every Night     amoxicillin (AMOXIL) 500 MG capsule Take 2,000 mg by mouth once as needed (One hour prior to dental appointment).  (Patient not taking: Reported on 09/02/2021)     aspirin 81 MG tablet Take 81 mg by mouth daily.     augmented betamethasone dipropionate (DIPROLENE-AF) 0.05 % ointment Apply 1 application topically daily as needed (itching).      Calcium Carbonate-Vit D-Min (CALTRATE 600+D PLUS MINERALS) 600-800 MG-UNIT CHEW Chew 2 each by mouth 2 (two) times daily.     cholecalciferol (VITAMIN D) 1000 UNITS tablet Take 1,000 Units by mouth daily.     citalopram (CELEXA) 40 MG tablet Take 40 mg by mouth daily.      Coenzyme Q10 (CO Q-10 PO) Take 1 tablet by mouth daily.     Cyanocobalamin (VITAMIN B-12 PO) Take 1,000 mcg by mouth daily.     diazepam (VALIUM) 5 MG tablet Take 1 tablet (5 mg total) by mouth every 6 (six) hours as  needed for anxiety (Take 30-min prior MRI). 1 tablet 0   diclofenac Sodium (VOLTAREN) 1 % GEL diclofenac 1 % topical gel     Difluprednate 0.05 % EMUL Place 2 drops into the right eye 2 (two) times daily.      ergocalciferol (VITAMIN D2) 1.25 MG (50000 UT) capsule      esomeprazole (NEXIUM) 40 MG capsule Take 40 mg by mouth 2 (two) times daily before a meal.      famciclovir (FAMVIR) 125 MG tablet      Iron, Ferrous Sulfate, 325 (65 Fe) MG TABS Take 1 tablet by mouth daily.     L-Methylfolate-Algae-B12-B6 (METANX) 3-90.314-2-35 MG CAPS      levothyroxine (SYNTHROID, LEVOTHROID) 75 MCG tablet Take 75 mcg by mouth daily before breakfast.      lovastatin (MEVACOR) 20 MG tablet Take 20 mg by mouth daily.      magnesium oxide (MAG-OX) 400 MG tablet Take 400 mg by mouth daily.     metoprolol succinate (TOPROL-XL) 25 MG 24 hr tablet Take 25 mg by mouth daily.      mirabegron ER (MYRBETRIQ) 25 MG TB24 tablet Myrbetriq 25 mg tablet,extended release     Multiple Vitamins-Minerals (MULTIVITAMINS THER. W/MINERALS) TABS Take 1 tablet by mouth daily.     nystatin ointment (MYCOSTATIN) nystatin 100,000 unit/gram topical ointment     nystatin-triamcinolone ointment (MYCOLOG) Apply 1 application topically daily as needed (itching).      Polyethyl Glycol-Propyl Glycol 0.4-0.3 % SOLN Apply 2 drops to eye 2 (two) times daily as needed (for dry eyes).     predniSONE (DELTASONE) 2.5 MG tablet Take 1 tablet on Mondays, Wednesdays and Fridays. Take 2 tablets on Tuesdays, Thursdays, Saturdays and Sundays. Take in the morning with food 60 tablet 1   pregabalin (LYRICA) 50 MG capsule Take 1 capsule by mouth 2 (two) times daily.     Probiotic Product (PROBIOTIC PO) Take 1 tablet by mouth daily.     triamcinolone ointment (KENALOG) 0.1 % Apply 1 application topically 2 (two) times daily.     No current facility-administered medications for this visit.    SUMMARY OF ONCOLOGIC HISTORY:  Darlene [name] was transferred to my care after her prior physician has left.  I reviewed the patient's records extensive and collaborated the history with the patient. 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 She never  required any systemic treatment. 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 This has also resolved on its own. She has never developed any additional adenopathy other than the single lymph node palpable in the left supraclavicular fossa.  She did develop a second primary stage I,  0.8 cm,  ER negative cancer of the right breast, treated with lumpectomy, radiation and six cycles of CMF chemotherapy in 1994. No evidence of a new disease from the breast cancer either. Most recent mammogram done in June 2015 showed stable fibroglandular changes in her breasts. She was called back for an ultrasound of the left breast which also came back benign. She had a CT scan of the abdomen done on 11/14/15 because of significant intentional weight loss and abdominal discomfort.  Imaging studies show no evidence of lymphoma.  Moderate stool burden was noted. Repeat CT scan in March 2019 show stable lymphadenopathy with no signs or symptoms of cancer  recurrence On 01/26/2018, she underwent transurethral bladder resection of the tumor by urologist Between 2019-2023, she had recurrent ITP relapse Up until Sep 23, 2021, she was taking 5 mg of prednisone daily.  Insurance company has declined Pharmacist, community prior authorization, mandating platelet count has to be less than 50,000 before they will approve it Starting Sep 24, 2021, the dose of prednisone is changed to 5 mg daily except on Mondays, Wednesdays and Fridays for her to take 2.5 mg  PHYSICAL EXAMINATION: ECOG PERFORMANCE STATUS: 1 - Symptomatic but completely ambulatory  Vitals:   09/23/21 1212  BP: (!) 140/57  Pulse: 66  Resp: 18  Temp: 98 F (36.7 C)  SpO2: 98%   Filed Weights   09/23/21 1212  Weight: 176 lb 3.2 oz (79.9 kg)    GENERAL:alert, no distress and comfortable Noted skin bruising NEURO: alert & oriented x 3 with fluent speech, no focal motor/sensory deficits  LABORATORY DATA:  I have reviewed  the data as listed    Component Value Date/Time   NA 138 07/03/2021 1153   NA 135 (L) 07/07/2016 1035   K 4.0 07/03/2021 1153   K 4.8 07/07/2016 1035   CL 107 07/03/2021 1153   CL 106 07/11/2012 0906   CO2 24 07/03/2021 1153   CO2 24 07/07/2016 1035   GLUCOSE 81 07/03/2021 1153   GLUCOSE 85 07/07/2016 1035   GLUCOSE 107 (H) 07/11/2012 0906   BUN 15 07/03/2021 1153   BUN 19.9 07/07/2016 1035   CREATININE 0.64 07/03/2021 1153   CREATININE 0.7 07/07/2016 1035   CALCIUM 10.0 07/03/2021 1153   CALCIUM 10.2 07/07/2016 1035   PROT 6.6 07/03/2021 1153   PROT 6.6 07/07/2016 1035   ALBUMIN 4.1 07/03/2021 1153   ALBUMIN 3.9 07/07/2016 1035   AST 15 07/03/2021 1153   AST 14 07/07/2016 1035   ALT 14 07/03/2021 1153   ALT 17 07/07/2016 1035   ALKPHOS 58 07/03/2021 1153   ALKPHOS 58 07/07/2016 1035   BILITOT 0.9 07/03/2021 1153   BILITOT 0.61 07/07/2016 1035   GFRNONAA >60 07/03/2021 1153   GFRAA >60 05/19/2018 1551    No results found for: SPEP, UPEP  Lab Results  Component Value Date   WBC 10.3 09/23/2021   NEUTROABS 8.4 (H) 09/23/2021   HGB 11.9 (L) 09/23/2021   HCT 37.0 09/23/2021   MCV 83.9 09/23/2021   PLT 121 (L) 09/23/2021      Chemistry      Component Value Date/Time   NA 138 07/03/2021 1153   NA 135 (L) 07/07/2016 1035   K 4.0 07/03/2021 1153   K 4.8 07/07/2016 1035   CL 107 07/03/2021 1153   CL 106 07/11/2012 0906   CO2 24 07/03/2021 1153   CO2 24 07/07/2016 1035   BUN 15 07/03/2021 1153   BUN 19.9 07/07/2016 1035   CREATININE 0.64 07/03/2021 1153   CREATININE 0.7 07/07/2016 1035      Component Value Date/Time   CALCIUM 10.0 07/03/2021 1153   CALCIUM 10.2 07/07/2016 1035   ALKPHOS 58 07/03/2021 1153   ALKPHOS 58 07/07/2016 1035   AST 15 07/03/2021 1153   AST 14 07/07/2016 1035   ALT 14 07/03/2021 1153   ALT 17 07/07/2016 1035   BILITOT 0.9 07/03/2021 1153   BILITOT 0.61 07/07/2016 1035

## 2021-09-23 NOTE — Assessment & Plan Note (Signed)
She has multifactorial anemia including component of iron deficiency She is tolerating oral iron supplement well I have reviewed GI referral notes I agree with her to proceed with EGD

## 2021-09-29 ENCOUNTER — Encounter: Payer: Self-pay | Admitting: Certified Registered Nurse Anesthetist

## 2021-09-30 ENCOUNTER — Ambulatory Visit: Payer: Medicare Other | Admitting: Podiatry

## 2021-09-30 ENCOUNTER — Telehealth: Payer: Self-pay

## 2021-09-30 NOTE — Telephone Encounter (Signed)
Returned her call. She did not feel well this morning and did not take her Prednisone. She is feeling better and asking if she should take it now. Instructed her to take Prednisone now and start taking it in the am with food. She verbalized understanding.

## 2021-10-01 ENCOUNTER — Telehealth: Payer: Self-pay

## 2021-10-01 ENCOUNTER — Inpatient Hospital Stay (HOSPITAL_BASED_OUTPATIENT_CLINIC_OR_DEPARTMENT_OTHER): Payer: Medicare Other | Admitting: Physician Assistant

## 2021-10-01 ENCOUNTER — Inpatient Hospital Stay: Payer: Medicare Other

## 2021-10-01 ENCOUNTER — Other Ambulatory Visit: Payer: Self-pay | Admitting: Physician Assistant

## 2021-10-01 ENCOUNTER — Other Ambulatory Visit: Payer: Self-pay

## 2021-10-01 VITALS — BP 133/74 | HR 68 | Temp 98.2°F | Resp 18 | Wt 173.6 lb

## 2021-10-01 DIAGNOSIS — T148XXA Other injury of unspecified body region, initial encounter: Secondary | ICD-10-CM | POA: Diagnosis not present

## 2021-10-01 DIAGNOSIS — D693 Immune thrombocytopenic purpura: Secondary | ICD-10-CM | POA: Diagnosis not present

## 2021-10-01 DIAGNOSIS — R300 Dysuria: Secondary | ICD-10-CM

## 2021-10-01 DIAGNOSIS — F419 Anxiety disorder, unspecified: Secondary | ICD-10-CM | POA: Diagnosis not present

## 2021-10-01 DIAGNOSIS — Z79899 Other long term (current) drug therapy: Secondary | ICD-10-CM | POA: Diagnosis not present

## 2021-10-01 DIAGNOSIS — D638 Anemia in other chronic diseases classified elsewhere: Secondary | ICD-10-CM | POA: Diagnosis not present

## 2021-10-01 DIAGNOSIS — Z853 Personal history of malignant neoplasm of breast: Secondary | ICD-10-CM | POA: Diagnosis not present

## 2021-10-01 LAB — CBC WITH DIFFERENTIAL/PLATELET
Abs Immature Granulocytes: 0.03 10*3/uL (ref 0.00–0.07)
Basophils Absolute: 0.1 10*3/uL (ref 0.0–0.1)
Basophils Relative: 1 %
Eosinophils Absolute: 0.1 10*3/uL (ref 0.0–0.5)
Eosinophils Relative: 1 %
HCT: 38.7 % (ref 36.0–46.0)
Hemoglobin: 12.6 g/dL (ref 12.0–15.0)
Immature Granulocytes: 0 %
Lymphocytes Relative: 18 %
Lymphs Abs: 1.7 10*3/uL (ref 0.7–4.0)
MCH: 27.3 pg (ref 26.0–34.0)
MCHC: 32.6 g/dL (ref 30.0–36.0)
MCV: 83.8 fL (ref 80.0–100.0)
Monocytes Absolute: 0.5 10*3/uL (ref 0.1–1.0)
Monocytes Relative: 5 %
Neutro Abs: 6.9 10*3/uL (ref 1.7–7.7)
Neutrophils Relative %: 75 %
Platelets: 123 10*3/uL — ABNORMAL LOW (ref 150–400)
RBC: 4.62 MIL/uL (ref 3.87–5.11)
RDW: 17.3 % — ABNORMAL HIGH (ref 11.5–15.5)
WBC: 9.1 10*3/uL (ref 4.0–10.5)
nRBC: 0 % (ref 0.0–0.2)

## 2021-10-01 LAB — CMP (CANCER CENTER ONLY)
ALT: 17 U/L (ref 0–44)
AST: 17 U/L (ref 15–41)
Albumin: 4.3 g/dL (ref 3.5–5.0)
Alkaline Phosphatase: 46 U/L (ref 38–126)
Anion gap: 6 (ref 5–15)
BUN: 15 mg/dL (ref 8–23)
CO2: 27 mmol/L (ref 22–32)
Calcium: 10.4 mg/dL — ABNORMAL HIGH (ref 8.9–10.3)
Chloride: 104 mmol/L (ref 98–111)
Creatinine: 0.7 mg/dL (ref 0.44–1.00)
GFR, Estimated: >60 mL/min (ref >60)
Glucose, Bld: 110 mg/dL — ABNORMAL HIGH (ref 70–99)
Potassium: 3.9 mmol/L (ref 3.5–5.1)
Sodium: 137 mmol/L (ref 135–145)
Total Bilirubin: 1.4 mg/dL — ABNORMAL HIGH (ref 0.3–1.2)
Total Protein: 6.9 g/dL (ref 6.5–8.1)

## 2021-10-01 LAB — URINALYSIS, COMPLETE (UACMP) WITH MICROSCOPIC
Bacteria, UA: NONE SEEN
Bilirubin Urine: NEGATIVE
Glucose, UA: NEGATIVE mg/dL
Ketones, ur: 5 mg/dL — AB
Nitrite: NEGATIVE
Protein, ur: NEGATIVE mg/dL
Specific Gravity, Urine: 1.011 (ref 1.005–1.030)
pH: 6 (ref 5.0–8.0)

## 2021-10-01 NOTE — Telephone Encounter (Signed)
Attempted to call her back. She called the after hours phone last night x 2 last with c/o's of UTI symptoms. No answer or voice mail when I called Emmajo back.  Called and left a message for her daughter and ask her to call the office back.

## 2021-10-01 NOTE — Progress Notes (Signed)
Symptom Management Consult note [place]    Patient Care Team: Holland Commons, FNP as PCP - General (Internal Medicine)    Name of the patient: Darlene [name]  [id number]  Jan 12, 1935   Date of visit: 10/01/2021    Chief complaint/ Reason for visit- anxiety  Oncology History   No history exists.    Current Therapy: prednisone taper  Interval history- Darlene [name] is an 87 yo female with history of chronic ITP presenting to [facility] today with chief complaint of anxiety. She reports this had been ongoing for awhile although seems worse with her current prednisone usage. She admits to feeling anxious last night and called the cancer center to ask for advise. Patient mentioned having urinary frequency and was recommend to come in for evaluation and lab work. She tells me      ROS  All other systems are reviewed and are negative for acute change except as noted in the HPI.    Allergies  Allergen Reactions   Oxycodone Other (See Comments)    Made her depressed   Sulfamethoxazole-Trimethoprim     Other reaction(s): Unknown   Codeine Itching            Doxycycline Swelling and Other (See Comments)    Burns throat also   Meperidine And Related Itching and Rash    Makes cheeks red also   Sulfa Antibiotics Itching   Sulfacetamide Sodium Itching     Past Medical History:  Diagnosis Date   Anxiety    Breast cancer, stage 1, estrogen receptor negative ([place]) 07/06/2011   Cancer of breast (HCC)    Cataract    Chronic ITP (idiopathic thrombocytopenia) (HCC) 07/06/2011   Constipation    takes Colace daily   COPD (chronic obstructive pulmonary disease) (HCC)    mild   Depression    "nervous break down age 42"   Fast heart beat    GERD (gastroesophageal reflux disease)    takes Nexium daily   History of colon polyps    History of depression    received shock treatments    History of palpitations    takes Metoprolol daily   History of vertigo     HOH (hard of hearing)    has hearing aids   Hyperlipidemia    takes Lovastatin daily   Hypertension    Hypothyroid 07/06/2011   takes SYnthroid daily   Joint pain    Joint swelling    Lymphoma (HCC)    Lymphoma (HCC)    B cell    Lymphoma of lymph nodes of head, face, and/or neck ([place]) 07/06/2011   Neuropathy    Nocturia    Osteopenia    Osteoporosis    hx of and take Reclast   Peripheral neuropathy    Personal history of chemotherapy    Personal history of radiation therapy    Right knee DJD    Shortness of breath dyspnea    with exertion   Thyroid disease    Umbilical hernia      Past Surgical History:  Procedure Laterality Date   ADENOIDECTOMY     as a child   BREAST LUMPECTOMY Right 1994   BREAST SURGERY Right    BUNIONECTOMY Right    CARDIAC CATHETERIZATION  2014   COLONOSCOPY     FOOT SURGERY Right    FRACTURE SURGERY     wrist right , right knee   INSERTION OF MESH N/A 04/17/2015  Procedure: INSERTION OF MESH;  Surgeon: Ralene Ok, MD;  Location: [place];  Service: General;  Laterality: N/A;   KNEE SURGERY     LEFT HEART CATHETERIZATION WITH CORONARY ANGIOGRAM N/A 12/05/2012   Procedure: LEFT HEART CATHETERIZATION WITH CORONARY ANGIOGRAM;  Surgeon: [name] Sine, MD;  Location[facility] CATH LAB;  Service: Cardiovascular;  Laterality: N/A;   right  breast surgery     d/t breast cancer   TONSILLECTOMY     as a child   TOTAL KNEE ARTHROPLASTY Right 06/19/2013   Procedure: TOTAL KNEE ARTHROPLASTY;  Surgeon: Lorn Junes, MD;  Location: [place];  Service: Orthopedics;  Laterality: Right;   TRANSURETHRAL RESECTION OF BLADDER TUMOR WITH GYRUS (TURBT-GYRUS)     01-26-18 Dr. Louis Meckel   TRANSURETHRAL RESECTION OF BLADDER TUMOR WITH MITOMYCIN-C Bilateral 01/26/2018   Procedure: TRANSURETHRAL RESECTION OF BLADDER TUMOR WITH GEMCITABIN BLLATERAL RETROGRADE PYELOGRAM;  Surgeon: Ardis Hughs, MD;  Location: [facility];  Service: Urology;  Laterality: Bilateral;    TURBINATE REDUCTION     UMBILICAL HERNIA REPAIR N/A 04/17/2015   Procedure: LAPAROSCOPIC UMBILICAL HERNIA REPAIR WITH MESH;  Surgeon: Ralene Ok, MD;  Location: [place];  Service: General;  Laterality: N/A;   UPPER GASTROINTESTINAL ENDOSCOPY      Social History   Socioeconomic History   Marital status: Widowed    Spouse name: Not on file   Number of children: 2   Years of education: HS   Highest education level: Not on file  Occupational History   Occupation: Retired  Tobacco Use   Smoking status: Former    Packs/day: 0.30    Types: Cigarettes    Quit date: 1985    Years since quitting: 38.4   Smokeless tobacco: Never   Tobacco comments:    quit smoking 1985  was a casual smoker  Vaping Use   Vaping Use: Never used  Substance and Sexual Activity   Alcohol use: No    Alcohol/week: 0.0 standard drinks   Drug use: No   Sexual activity: Not Currently    Birth control/protection: Post-menopausal  Other Topics Concern   Not on file  Social History Narrative   Lives at home alone.   Right-handed.   4 cups of caffeine daily.   Social Determinants of Health   Financial Resource Strain: Not on file  Food Insecurity: Not on file  Transportation Needs: Not on file  Physical Activity: Not on file  Stress: Not on file  Social Connections: Not on file  Intimate Partner Violence: Not on file    Family History  Problem Relation Age of Onset   Other Mother        C Diff   Hyperlipidemia Father    Heart disease Father    Heart disease Sister    Cancer Brother        Agent orange   Heart disease Maternal Grandfather    Diabetes Son    Mood Disorder Daughter    Hypertension Daughter    Colon cancer Neg Hx    Breast cancer Neg Hx      Current Outpatient Medications:    acetaminophen (TYLENOL) 500 MG tablet, Take 1 tablet (500 mg total) by mouth every 6 (six) hours as needed for mild pain. (Patient taking differently: Take 1,000 mg by mouth every 8 (eight) hours as  needed for mild pain.), Disp: 30 tablet, Rfl: 0   alclomethasone (ACLOVATE) 0.05 % cream, SMARTSIG:1 Topical Every Night, Disp: , Rfl:  amoxicillin (AMOXIL) 500 MG capsule, Take 2,000 mg by mouth once as needed (One hour prior to dental appointment).  (Patient not taking: Reported on 09/02/2021), Disp: , Rfl:    aspirin 81 MG tablet, Take 81 mg by mouth daily., Disp: , Rfl:    augmented betamethasone dipropionate (DIPROLENE-AF) 0.05 % ointment, Apply 1 application topically daily as needed (itching). , Disp: , Rfl:    Calcium Carbonate-Vit D-Min (CALTRATE 600+D PLUS MINERALS) 600-800 MG-UNIT CHEW, Chew 2 each by mouth 2 (two) times daily., Disp: , Rfl:    cholecalciferol (VITAMIN D) 1000 UNITS tablet, Take 1,000 Units by mouth daily., Disp: , Rfl:    citalopram (CELEXA) 40 MG tablet, Take 40 mg by mouth daily. , Disp: , Rfl:    Coenzyme Q10 (CO Q-10 PO), Take 1 tablet by mouth daily., Disp: , Rfl:    Cyanocobalamin (VITAMIN B-12 PO), Take 1,000 mcg by mouth daily., Disp: , Rfl:    diazepam (VALIUM) 5 MG tablet, Take 1 tablet (5 mg total) by mouth every 6 (six) hours as needed for anxiety (Take 30-min prior MRI)., Disp: 1 tablet, Rfl: 0   diclofenac Sodium (VOLTAREN) 1 % GEL, diclofenac 1 % topical gel, Disp: , Rfl:    Difluprednate 0.05 % EMUL, Place 2 drops into the right eye 2 (two) times daily. , Disp: , Rfl:    ergocalciferol (VITAMIN D2) 1.25 MG (50000 UT) capsule, , Disp: , Rfl:    esomeprazole (NEXIUM) 40 MG capsule, Take 40 mg by mouth 2 (two) times daily before a meal., Disp: , Rfl:    famciclovir (FAMVIR) 125 MG tablet, , Disp: , Rfl:    Iron, Ferrous Sulfate, 325 (65 Fe) MG TABS, Take 1 tablet by mouth daily., Disp: , Rfl:    L-Methylfolate-Algae-B12-B6 (METANX) 3-90.314-2-35 MG CAPS, , Disp: , Rfl:    levothyroxine (SYNTHROID, LEVOTHROID) 75 MCG tablet, Take 75 mcg by mouth daily before breakfast. , Disp: , Rfl:    lovastatin (MEVACOR) 20 MG tablet, Take 20 mg by mouth daily. , Disp: ,  Rfl:    magnesium oxide (MAG-OX) 400 MG tablet, Take 400 mg by mouth daily., Disp: , Rfl:    metoprolol succinate (TOPROL-XL) 25 MG 24 hr tablet, Take 25 mg by mouth daily. , Disp: , Rfl:    mirabegron ER (MYRBETRIQ) 25 MG TB24 tablet, Myrbetriq 25 mg tablet,extended release, Disp: , Rfl:    Multiple Vitamins-Minerals (MULTIVITAMINS THER. W/MINERALS) TABS, Take 1 tablet by mouth daily., Disp: , Rfl:    nystatin ointment (MYCOSTATIN), nystatin 100,000 unit/gram topical ointment, Disp: , Rfl:    nystatin-triamcinolone ointment (MYCOLOG), Apply 1 application topically daily as needed (itching). , Disp: , Rfl:    Polyethyl Glycol-Propyl Glycol 0.4-0.3 % SOLN, Apply 2 drops to eye 2 (two) times daily as needed (for dry eyes)., Disp: , Rfl:    predniSONE (DELTASONE) 2.5 MG tablet, Take 1 tablet on Mondays, Wednesdays and Fridays. Take 2 tablets on Tuesdays, Thursdays, Saturdays and Sundays. Take in the morning with food, Disp: 60 tablet, Rfl: 1   pregabalin (LYRICA) 50 MG capsule, Take 1 capsule by mouth 2 (two) times daily., Disp: , Rfl:    Probiotic Product (PROBIOTIC PO), Take 1 tablet by mouth daily., Disp: , Rfl:    triamcinolone ointment (KENALOG) 0.1 %, Apply 1 application topically 2 (two) times daily., Disp: , Rfl:   PHYSICAL EXAM: ECOG FS:1 - Symptomatic but completely ambulatory    Vitals:   10/01/21 1329  BP: 133/74  Pulse: 68  Resp: 18  Temp: 98.2 F (36.8 C)  TempSrc: Oral  SpO2: 99%  Weight: 173 lb 9.6 oz (78.7 kg)   Physical Exam Vitals and nursing note reviewed.  Constitutional:      Appearance: She is well-developed. She is not ill-appearing or toxic-appearing.  HENT:     Head: Normocephalic and atraumatic.     Nose: Nose normal.  Eyes:     General: No scleral icterus.       Right eye: No discharge.        Left eye: No discharge.     Conjunctiva/sclera: Conjunctivae normal.  Neck:     Vascular: No JVD.  Cardiovascular:     Rate and Rhythm: Normal rate and  regular rhythm.     Pulses: Normal pulses.     Heart sounds: Normal heart sounds.  Pulmonary:     Effort: Pulmonary effort is normal.     Breath sounds: Normal breath sounds.  Abdominal:     General: There is no distension.     Palpations: Abdomen is soft. There is no mass.     Tenderness: There is no abdominal tenderness. There is no guarding or rebound.     Hernia: No hernia is present.  Musculoskeletal:        General: Normal range of motion.     Cervical back: Normal range of motion.  Skin:    General: Skin is warm and dry.  Neurological:     Mental Status: She is oriented to person, place, and time.     GCS: GCS eye subscore is 4. GCS verbal subscore is 5. GCS motor subscore is 6.     Comments: Speech is clear and goal oriented, follows commands CN III-XII intact, no facial droop Normal strength in upper and lower extremities bilaterally including dorsiflexion and plantar flexion, strong and equal grip strength Sensation normal to light and sharp touch Moves extremities without ataxia, coordination intact Normal finger to nose and rapid alternating movements Slow gait with rolling walker which is baseline per patient    Psychiatric:        Behavior: Behavior normal.       LABORATORY DATA: I have reviewed the data as listed    Latest Ref Rng & Units 10/01/2021    1:20 PM 09/23/2021   11:54 AM 08/22/2021   11:40 AM  CBC  WBC 4.0 - 10.5 K/uL 9.1   10.3   10.2    Hemoglobin 12.0 - 15.0 g/dL 12.6   11.9   10.9    Hematocrit 36.0 - 46.0 % 38.7   37.0   34.5    Platelets 150 - 400 K/uL 123   121   112          Latest Ref Rng & Units 10/01/2021    1:20 PM 07/03/2021   11:53 AM 05/19/2018    3:51 PM  CMP  Glucose 70 - 99 mg/dL 110   81   89    BUN 8 - 23 mg/dL '15   15   13    '$ Creatinine 0.44 - 1.00 mg/dL 0.70   0.64   0.62    Sodium 135 - 145 mmol/L 137   138   136    Potassium 3.5 - 5.1 mmol/L 3.9   4.0   4.0    Chloride 98 - 111 mmol/L 104   107   105    CO2 22 -  32 mmol/L 27   24  21    Calcium 8.9 - 10.3 mg/dL 10.4   10.0   9.6    Total Protein 6.5 - 8.1 g/dL 6.9   6.6     Total Bilirubin 0.3 - 1.2 mg/dL 1.4   0.9     Alkaline Phos 38 - 126 U/L 46   58     AST 15 - 41 U/L 17   15     ALT 0 - 44 U/L 17   14          RADIOGRAPHIC STUDIES: I have personally reviewed the radiological images as listed and agreed with the findings in the report. No images are attached to the encounter. No results found.   ASSESSMENT & PLAN: Patient is a 87 y.o. female  with history of chronic ITP followed by Dr. Alvy Bimler.  I have viewed most recent oncology note and lab work.  #)anxiety- patient non toxic, well appearing. VSS. She mentioned urinary frequency to the triage RN last night however tells me today that has been going on for months and she is denying any symptoms of UTI. Labs checked today and CBC with thrombocytopenia consistent with recent labs, CMP overall unremarkable except t. Bili of 1.4. patient has no abdominal tenderness and no jaundice. UA with small leukocytes, otherwise no findings to suggest UTI. No indication for antibiotic coverage. It seems that overall patient is concerned about continuing prednisone. She has a normal neuro exam today. She prefers to discuss treatment further with Dr. Alvy Bimler. She is not in the office today however Darliss Ridgel will inform her tomorrow about patient's request to speak with her. Advised patient not to abruptly discontinue prednisone.    Visit Diagnosis: 1. Anxiety      No orders of the defined types were placed in this encounter.   All questions were answered. The patient knows to call the clinic with any problems, questions or concerns. No barriers to learning was detected.  I have spent a total of 20 minutes minutes of face-to-face and non-face-to-face time, preparing to see the patient, obtaining and/or reviewing separately obtained history, performing a medically appropriate examination, counseling and  educating the patient, ordering tests,  documenting clinical information in the electronic health record, and care coordination.     Thank you for allowing me to participate in the care of this patient.    Barrie Folk, PA-C Department of Hematology/Oncology [facility] at [facility] Phone: [phone]  [phone]    10/01/2021 5:18 PM

## 2021-10-01 NOTE — Telephone Encounter (Signed)
Called her back and scheduled appt with symptom management at 1230 today with labs at 1200. She is aware of appt time.  She is complaining of possible UTI and wanting to be seen. Feeling nervous and anxious. Feels sweaty and not sure if she has a fever. She does not have a thermometer.

## 2021-10-02 ENCOUNTER — Telehealth: Payer: Self-pay

## 2021-10-02 NOTE — Telephone Encounter (Signed)
Called and offered appt 6/8 at 1:30 pm to see Dr. Alvy Bimler to discuss Prednisone. She is less anxious today. She is unable to come Monday because she is having EGD. Will call her back.

## 2021-10-02 NOTE — Telephone Encounter (Signed)
Called back and scheduled appt at 1120 6/6. She is agreeable and aware of appt.

## 2021-10-04 ENCOUNTER — Emergency Department (HOSPITAL_COMMUNITY)
Admission: EM | Admit: 2021-10-04 | Discharge: 2021-10-04 | Disposition: A | Discharge status: Home / Self Care | Payer: Medicare Other | Attending: Emergency Medicine | Admitting: Emergency Medicine

## 2021-10-04 ENCOUNTER — Emergency Department (HOSPITAL_COMMUNITY): Payer: Medicare Other

## 2021-10-04 ENCOUNTER — Telehealth: Payer: Self-pay | Admitting: Nurse Practitioner

## 2021-10-04 ENCOUNTER — Other Ambulatory Visit: Payer: Self-pay

## 2021-10-04 ENCOUNTER — Encounter (HOSPITAL_COMMUNITY): Payer: Self-pay | Admitting: Emergency Medicine

## 2021-10-04 DIAGNOSIS — Z7982 Long term (current) use of aspirin: Secondary | ICD-10-CM | POA: Insufficient documentation

## 2021-10-04 DIAGNOSIS — S060X0A Concussion without loss of consciousness, initial encounter: Secondary | ICD-10-CM | POA: Diagnosis not present

## 2021-10-04 DIAGNOSIS — Z23 Encounter for immunization: Secondary | ICD-10-CM | POA: Diagnosis not present

## 2021-10-04 DIAGNOSIS — W1812XA Fall from or off toilet with subsequent striking against object, initial encounter: Secondary | ICD-10-CM | POA: Diagnosis not present

## 2021-10-04 DIAGNOSIS — R58 Hemorrhage, not elsewhere classified: Secondary | ICD-10-CM | POA: Diagnosis not present

## 2021-10-04 DIAGNOSIS — S0003XA Contusion of scalp, initial encounter: Secondary | ICD-10-CM | POA: Insufficient documentation

## 2021-10-04 DIAGNOSIS — R6889 Other general symptoms and signs: Secondary | ICD-10-CM | POA: Diagnosis not present

## 2021-10-04 DIAGNOSIS — S0990XA Unspecified injury of head, initial encounter: Secondary | ICD-10-CM | POA: Diagnosis not present

## 2021-10-04 DIAGNOSIS — W19XXXA Unspecified fall, initial encounter: Secondary | ICD-10-CM | POA: Diagnosis not present

## 2021-10-04 DIAGNOSIS — Z743 Need for continuous supervision: Secondary | ICD-10-CM | POA: Diagnosis not present

## 2021-10-04 MED ORDER — TETANUS-DIPHTH-ACELL PERTUSSIS 5-2.5-18.5 LF-MCG/0.5 IM SUSY
0.5000 mL | PREFILLED_SYRINGE | Freq: Once | INTRAMUSCULAR | Status: AC
  Administered 2021-10-04: 0.5 mL via INTRAMUSCULAR
  Filled 2021-10-04: qty 0.5

## 2021-10-04 NOTE — Telephone Encounter (Signed)
Dr. Carlean Purl, patient called the on-call hospitalist service on 10/04/2021. She fell and went to the ED today and she was diagnosed with a concussion.  She was concerned about her EGD scheduled with you on Monday 10/06/2021 at [place]. She appropriately wishes to cancel the EGD but does not want to be charged for a short notice cancellation.  I reassured Ms. Kath that we will cancel her EGD without charge. I discussed an EGD should not be done 6/5 due to her concussion.   Remo Lipps, please cancel her EGD.  Please schedule her for follow-up appointment with Dr. Carlean Purl so he can determine if any future EGD should be done.  Thank you.

## 2021-10-04 NOTE — ED Triage Notes (Signed)
Pt bib [facility] from home for fall, not on blood thinners, no LOC. Pt fell from standing at toilet, lost balance, fell backwards, and fell into shower & hit head. Approx 1-inch laceration to right side parietal area of head, non-hemorrhaging. Arrives A&O x4.   EMS vitals: BP 162/92 Pulse 73 95% room air RR 18

## 2021-10-04 NOTE — ED Notes (Signed)
PTAR cancelled per RN Ermalinda Memos request

## 2021-10-04 NOTE — ED Notes (Signed)
Patient transported to CT 

## 2021-10-04 NOTE — ED Provider Notes (Signed)
[facility] EMERGENCY DEPARTMENT Provider Note   CSN: [id number] Arrival date & time: 10/04/21  0443     History  Chief complaint - fall, head injury   Darlene [name] is a 87 y.o. female.  The history is provided by the patient.  Patient with history of idiopathic thrombocytopenia presents after a fall.  Patient reports she was sitting on the toilet when she stood up she lost her balance and fell back hitting her head.  No LOC but has a headache.  She reports bleeding with a laceration No other acute trauma. She does not take anticoagulant  Home Medications Prior to Admission medications   Medication Sig Start Date End Date Taking? Authorizing Provider  acetaminophen (TYLENOL) 500 MG tablet Take 1 tablet (500 mg total) by mouth every 6 (six) hours as needed for mild pain. Patient taking differently: Take 1,000 mg by mouth every 8 (eight) hours as needed for mild pain. 04/19/16   Quintella Reichert, MD  alclomethasone (ACLOVATE) 0.05 % cream SMARTSIG:1 Topical Every Night 11/08/19   [provider]  amoxicillin (AMOXIL) 500 MG capsule Take 2,000 mg by mouth once as needed (One hour prior to dental appointment).  Patient not taking: Reported on 09/02/2021    [provider]  aspirin 81 MG tablet Take 81 mg by mouth daily.    [provider]  augmented betamethasone dipropionate (DIPROLENE-AF) 0.05 % ointment Apply 1 application topically daily as needed (itching).     [provider]  Calcium Carbonate-Vit D-Min (CALTRATE 600+D PLUS MINERALS) 600-800 MG-UNIT CHEW Chew 2 each by mouth 2 (two) times daily.    [provider]  cholecalciferol (VITAMIN D) 1000 UNITS tablet Take 1,000 Units by mouth daily.    [provider]  citalopram (CELEXA) 40 MG tablet Take 40 mg by mouth daily.     [provider]  Coenzyme Q10 (CO Q-10 PO) Take 1 tablet by mouth daily.    [provider]  Cyanocobalamin (VITAMIN  B-12 PO) Take 1,000 mcg by mouth daily.    [provider]  diazepam (VALIUM) 5 MG tablet Take 1 tablet (5 mg total) by mouth every 6 (six) hours as needed for anxiety (Take 30-min prior MRI). 12/26/19   Narda Amber K, DO  diclofenac Sodium (VOLTAREN) 1 % GEL diclofenac 1 % topical gel    [provider]  Difluprednate 0.05 % EMUL Place 2 drops into the right eye 2 (two) times daily.     [provider]  ergocalciferol (VITAMIN D2) 1.25 MG (50000 UT) capsule  06/18/18   [provider]  esomeprazole (NEXIUM) 40 MG capsule Take 40 mg by mouth 2 (two) times daily before a meal.    [provider]  famciclovir (FAMVIR) 125 MG tablet  08/15/18   [provider]  Iron, Ferrous Sulfate, 325 (65 Fe) MG TABS Take 1 tablet by mouth daily.    [provider]  L-Methylfolate-Algae-B12-B6 [place]) 3-90.314-2-35 MG CAPS  01/20/16   [provider]  levothyroxine (SYNTHROID, LEVOTHROID) 75 MCG tablet Take 75 mcg by mouth daily before breakfast.     [provider]  lovastatin (MEVACOR) 20 MG tablet Take 20 mg by mouth daily.     [provider]  magnesium oxide (MAG-OX) 400 MG tablet Take 400 mg by mouth daily.    [provider]  metoprolol succinate (TOPROL-XL) 25 MG 24 hr tablet Take 25 mg by mouth daily.     [provider]  mirabegron ER (MYRBETRIQ) 25 MG TB24 tablet Myrbetriq 25 mg tablet,extended release    [provider]  Multiple Vitamins-Minerals (MULTIVITAMINS THER. W/MINERALS) TABS Take 1 tablet by mouth daily.    [provider]  nystatin ointment (MYCOSTATIN) nystatin 100,000 unit/gram topical ointment    [provider]  nystatin-triamcinolone ointment (MYCOLOG) Apply 1 application topically daily as needed (itching).     [provider]  Polyethyl Glycol-Propyl Glycol 0.4-0.3 % SOLN Apply 2 drops to eye 2 (two) times daily as needed (for dry eyes).     [provider]  predniSONE (DELTASONE) 2.5 MG tablet Take 1 tablet on Mondays, Wednesdays and Fridays. Take 2 tablets on Tuesdays, Thursdays, Saturdays and Sundays. Take in the morning with food 09/23/21   Heath Lark, MD  pregabalin (LYRICA) 50 MG capsule Take 1 capsule by mouth 2 (two) times daily. 02/26/21   [provider]  Probiotic Product (PROBIOTIC PO) Take 1 tablet by mouth daily.    [provider]  triamcinolone ointment (KENALOG) 0.1 % Apply 1 application topically 2 (two) times daily. 07/06/19   [provider]      Allergies    Oxycodone, Sulfamethoxazole-trimethoprim, Codeine, Doxycycline, Meperidine and related, Sulfa antibiotics, and Sulfacetamide sodium    Review of Systems   Review of Systems  Skin:  Positive for wound.  Neurological:  Positive for headaches.   Physical Exam Updated Vital Signs BP (!) 151/77   Pulse 67   Temp 98 F (36.7 C) (Oral)   Resp 15   Ht 1.524 m (5')   Wt 78.5 kg   SpO2 96%   BMI 33.79 kg/m  Physical Exam CONSTITUTIONAL: Elderly, no acute distress HEAD: Laceration noted to the right parietal scalp, no signs of trauma EYES: EOMI/PERRL ENMT: Mucous membranes moist NECK: supple no meningeal signs SPINE/BACK:entire spine nontender CV: S1/S2 noted, no murmurs/rubs/gallops noted LUNGS: Lungs are clear to auscultation bilaterally, no apparent distress ABDOMEN: soft NEURO: Pt is awake/alert/appropriate, moves all extremitiesx4.  No facial droop.  GCS 15 EXTREMITIES: pulses normal/equal, full ROM, all other extremities/joints palpated/ranged and nontender SKIN: warm, color normal PSYCH: no abnormalities of mood noted, alert and oriented to situation  ED Results / Procedures / Treatments   Labs (all labs ordered are listed, but only abnormal results are displayed) Labs Reviewed - No data to display  EKG None  Radiology CT Head Wo Contrast  Result Date: 10/04/2021 CLINICAL DATA:  87 year old female  status post fall in bathroom. Fell backwards. EXAM: CT HEAD WITHOUT CONTRAST TECHNIQUE: Contiguous axial images were obtained from the base of the skull through the vertex without intravenous contrast. RADIATION DOSE REDUCTION: This exam was performed according to the departmental dose-optimization program which includes automated exposure control, adjustment of the mA and/or kV according to patient size and/or use of iterative reconstruction technique. COMPARISON:  Head CT 07/14/2019. FINDINGS: Brain: Cerebral volume is not significantly changed since 2021. No midline shift, ventriculomegaly, mass effect, evidence of mass lesion, intracranial hemorrhage or evidence of cortically based acute infarction. Mild to moderate for age cerebral white matter hypodensity appears stable and most pronounced at the right frontal horn. Vascular: Calcified atherosclerosis at the skull base. No suspicious intracranial vascular hyperdensity. Skull: Stable, intact. Sinuses/Orbits: Visualized paranasal sinuses and mastoids are stable and well aerated. Chronic right ethmoid osteoma (inconsequential). Mild to moderate chronic left sphenoid sinus mucosal thickening is stable. Tympanic cavities appear clear. Other: Broad-based right posterosuperior convexity scalp hematoma with small volume soft tissue gas  associated on series 3, image 60. Underlying calvarium appears intact. Other orbit and scalp soft tissues appears stable. IMPRESSION: 1. Right posterosuperior convexity scalp laceration and hematoma without underlying skull fracture. 2. No acute intracranial abnormality. Stable non contrast CT appearance of the brain since 2021. Electronically Signed   By: Genevie Ann M.D.   On: 10/04/2021 06:48    Procedures Procedures    Medications Ordered in ED Medications  Tdap (BOOSTRIX) injection 0.5 mL (0.5 mLs Intramuscular Given 10/04/21 0520)    ED Course/ Medical Decision Making/ A&P Clinical Course as of 10/04/21 0710  Sat Oct 04, 2021  0652 On repeat exam, her wound is more of a deep abrasion and hematoma, no active bleeding and no large laceration to repair [DW]    Clinical Course User Index [DW] Ripley Fraise, MD         Glasgow Coma Scale Score: 15      NEXUS Criteria Score: 0            Medical Decision Making Amount and/or Complexity of Data Reviewed Radiology: ordered.  Risk Prescription drug management.   This patient presents to the ED for concern of head injury, this involves an extensive number of treatment options, and is a complaint that carries with it a high risk of complications and morbidity.  The differential diagnosis includes but is not limited to subdural hematoma, subarachnoid hemorrhage, intracranial hemorrhage, skull fracture  Comorbidities that complicate the patient evaluation: Patient's presentation is complicated by their history of idiopathic thrombocytopenia   Additional history obtained: Records reviewed  recent labs are reviewed-thrombocytopenia noted  Imaging Studies ordered: I ordered imaging studies including CT scan head   I independently visualized and interpreted imaging which showed no acute findings I agree with the radiologist interpretation  Considered CT C-spine but patient without any midline tenderness, no distracting injury, normal mental status  Does not meet criteria to receive CT C-spine  Reevaluation: After the interventions noted above, I reevaluated the patient and found that they have :improved  Complexity of problems addressed: Patient's presentation is most consistent with  acute presentation with potential threat to life or bodily function  Disposition: After consideration of the diagnostic results and the patient's response to treatment,  I feel that the patent would benefit from discharge   .    Patient able to stand with no new pain.  She is awake alert in no acute distress.  This is likely mechanical fall as patient was at her baseline  prior to the fall. No other work-up indicated at this time       Final Clinical Impression(s) / ED Diagnoses Final diagnoses:  Concussion without loss of consciousness, initial encounter  Hematoma of scalp, initial encounter    Rx / DC Orders ED Discharge Orders     None         Ripley Fraise, MD 10/04/21 [phone]

## 2021-10-04 NOTE — ED Notes (Signed)
Pt stood at bedside with little assistance, she was able to take a couple steps towards the head of the bed with assistance from this tech. No issues, pt stated she did not feel dizzy during standing. RN aware.

## 2021-10-04 NOTE — ED Notes (Signed)
[facility] called to transport patient  

## 2021-10-04 NOTE — ED Notes (Signed)
This NT cleaned the wound with normal saline and gauze. Wound is visible. Pt w/o complaints of pain while cleaning. I also cleaned her hands, face, chest, and ear.

## 2021-10-06 ENCOUNTER — Inpatient Hospital Stay: Payer: Medicare Other

## 2021-10-06 ENCOUNTER — Other Ambulatory Visit: Payer: Self-pay

## 2021-10-06 ENCOUNTER — Encounter: Payer: Self-pay | Admitting: Hematology and Oncology

## 2021-10-06 ENCOUNTER — Ambulatory Visit: Payer: Medicare Other | Admitting: Hematology and Oncology

## 2021-10-06 ENCOUNTER — Inpatient Hospital Stay: Payer: Medicare Other | Attending: Hematology and Oncology | Admitting: Hematology and Oncology

## 2021-10-06 ENCOUNTER — Encounter: Payer: Medicare Other | Admitting: Internal Medicine

## 2021-10-06 DIAGNOSIS — Z79899 Other long term (current) drug therapy: Secondary | ICD-10-CM | POA: Insufficient documentation

## 2021-10-06 DIAGNOSIS — Z853 Personal history of malignant neoplasm of breast: Secondary | ICD-10-CM | POA: Diagnosis not present

## 2021-10-06 DIAGNOSIS — S0101XA Laceration without foreign body of scalp, initial encounter: Secondary | ICD-10-CM | POA: Insufficient documentation

## 2021-10-06 DIAGNOSIS — R2689 Other abnormalities of gait and mobility: Secondary | ICD-10-CM | POA: Diagnosis not present

## 2021-10-06 DIAGNOSIS — R300 Dysuria: Secondary | ICD-10-CM

## 2021-10-06 DIAGNOSIS — Z885 Allergy status to narcotic agent status: Secondary | ICD-10-CM | POA: Insufficient documentation

## 2021-10-06 DIAGNOSIS — Z881 Allergy status to other antibiotic agents status: Secondary | ICD-10-CM | POA: Insufficient documentation

## 2021-10-06 DIAGNOSIS — D693 Immune thrombocytopenic purpura: Secondary | ICD-10-CM | POA: Diagnosis not present

## 2021-10-06 DIAGNOSIS — D638 Anemia in other chronic diseases classified elsewhere: Secondary | ICD-10-CM | POA: Insufficient documentation

## 2021-10-06 DIAGNOSIS — Z882 Allergy status to sulfonamides status: Secondary | ICD-10-CM | POA: Insufficient documentation

## 2021-10-06 DIAGNOSIS — R296 Repeated falls: Secondary | ICD-10-CM | POA: Diagnosis not present

## 2021-10-06 LAB — CBC WITH DIFFERENTIAL (CANCER CENTER ONLY)
Abs Immature Granulocytes: 0.03 10*3/uL (ref 0.00–0.07)
Basophils Absolute: 0 10*3/uL (ref 0.0–0.1)
Basophils Relative: 1 %
Eosinophils Absolute: 0.2 10*3/uL (ref 0.0–0.5)
Eosinophils Relative: 2 %
HCT: 36.4 % (ref 36.0–46.0)
Hemoglobin: 11.8 g/dL — ABNORMAL LOW (ref 12.0–15.0)
Immature Granulocytes: 0 %
Lymphocytes Relative: 20 %
Lymphs Abs: 1.6 10*3/uL (ref 0.7–4.0)
MCH: 27.9 pg (ref 26.0–34.0)
MCHC: 32.4 g/dL (ref 30.0–36.0)
MCV: 86.1 fL (ref 80.0–100.0)
Monocytes Absolute: 0.5 10*3/uL (ref 0.1–1.0)
Monocytes Relative: 7 %
Neutro Abs: 5.4 10*3/uL (ref 1.7–7.7)
Neutrophils Relative %: 70 %
Platelet Count: 101 10*3/uL — ABNORMAL LOW (ref 150–400)
RBC: 4.23 MIL/uL (ref 3.87–5.11)
RDW: 17 % — ABNORMAL HIGH (ref 11.5–15.5)
WBC Count: 7.7 10*3/uL (ref 4.0–10.5)
nRBC: 0 % (ref 0.0–0.2)

## 2021-10-06 NOTE — Assessment & Plan Note (Signed)
I told the patient that her symptoms of confusion is not related to her low-dose prednisone We discussed the risk and benefits of discontinuation of prednisone Ultimately, the patient felt that she should continue on prednisone therapy

## 2021-10-06 NOTE — Progress Notes (Signed)
[place] OFFICE PROGRESS NOTE  Patient Care Team: Holland Commons, FNP as PCP - General (Internal Medicine)  ASSESSMENT & PLAN:  Chronic ITP (idiopathic thrombocytopenia) ([place]) I told the patient that her symptoms of confusion is not related to her low-dose prednisone We discussed the risk and benefits of discontinuation of prednisone Ultimately, the patient felt that she should continue on prednisone therapy  Anemia, chronic disease She has multifactorial anemia with component of iron deficiency The patient called to cancel her EGD evaluation today She told me she spoke with somebody who felt it was a great idea given her recent fall and concussion We discussed again the rationale of her being referred for EGD evaluation and the patient will call back to get her EGD rescheduled  Recurrent falls She had history of recurrent falls in the past She have no major injuries and appears to be recovering well I told the patient this is not related to prednisone  No orders of the defined types were placed in this encounter.   All questions were answered. The patient knows to call the clinic with any problems, questions or concerns. The total time spent in the appointment was 20 minutes encounter with patients including review of chart and various tests results, discussions about plan of care and coordination of care plan   Heath Lark, MD 10/06/2021 3:25 PM  INTERVAL HISTORY: Please see below for problem oriented charting. she returns for treatment follow-up with her daughter She was supposed to get EGD today but the appointment was canceled At first, it appears to be some miscommunication about the role of EGD When I entered the room, her daughter appears to be crying The patient was sad that her health issues have caused significant concern with her daughter After she had the fall due to imbalance issues at home, she was evaluated in the emergency department  including CT imaging She has a scab on her scalp.  She does not need stitches The patient denies any recent signs or symptoms of bleeding such as spontaneous epistaxis, hematuria or hematochezia.   REVIEW OF SYSTEMS:   Constitutional: Denies fevers, chills or abnormal weight loss Eyes: Denies blurriness of vision Ears, nose, mouth, throat, and face: Denies mucositis or sore throat Respiratory: Denies cough, dyspnea or wheezes Cardiovascular: Denies palpitation, chest discomfort or lower extremity swelling Gastrointestinal:  Denies nausea, heartburn or change in bowel habits Skin: Denies abnormal skin rashes Lymphatics: Denies new lymphadenopathy or easy bruising Behavioral/Psych: Mood is stable, no new changes  All other systems were reviewed with the patient and are negative.  I have reviewed the past medical history, past surgical history, social history and family history with the patient and they are unchanged from previous note.  ALLERGIES:  is allergic to oxycodone, sulfamethoxazole-trimethoprim, codeine, doxycycline, meperidine and related, sulfa antibiotics, and sulfacetamide sodium.  MEDICATIONS:  Current Outpatient Medications  Medication Sig Dispense Refill   acetaminophen (TYLENOL) 500 MG tablet Take 1 tablet (500 mg total) by mouth every 6 (six) hours as needed for mild pain. (Patient taking differently: Take 1,000 mg by mouth every 8 (eight) hours as needed for mild pain.) 30 tablet 0   alclomethasone (ACLOVATE) 0.05 % cream SMARTSIG:1 Topical Every Night     amoxicillin (AMOXIL) 500 MG capsule Take 2,000 mg by mouth once as needed (One hour prior to dental appointment).  (Patient not taking: Reported on 09/02/2021)     aspirin 81 MG tablet Take 81 mg by mouth daily.  augmented betamethasone dipropionate (DIPROLENE-AF) 0.05 % ointment Apply 1 application topically daily as needed (itching).      Calcium Carbonate-Vit D-Min (CALTRATE 600+D PLUS MINERALS) 600-800 MG-UNIT  CHEW Chew 2 each by mouth 2 (two) times daily.     cholecalciferol (VITAMIN D) 1000 UNITS tablet Take 1,000 Units by mouth daily.     citalopram (CELEXA) 40 MG tablet Take 40 mg by mouth daily.      Coenzyme Q10 (CO Q-10 PO) Take 1 tablet by mouth daily.     Cyanocobalamin (VITAMIN B-12 PO) Take 1,000 mcg by mouth daily.     diazepam (VALIUM) 5 MG tablet Take 1 tablet (5 mg total) by mouth every 6 (six) hours as needed for anxiety (Take 30-min prior MRI). 1 tablet 0   diclofenac Sodium (VOLTAREN) 1 % GEL diclofenac 1 % topical gel     Difluprednate 0.05 % EMUL Place 2 drops into the right eye 2 (two) times daily.      ergocalciferol (VITAMIN D2) 1.25 MG (50000 UT) capsule      esomeprazole (NEXIUM) 40 MG capsule Take 40 mg by mouth 2 (two) times daily before a meal.     famciclovir (FAMVIR) 125 MG tablet      Iron, Ferrous Sulfate, 325 (65 Fe) MG TABS Take 1 tablet by mouth daily.     L-Methylfolate-Algae-B12-B6 (METANX) 3-90.314-2-35 MG CAPS      levothyroxine (SYNTHROID, LEVOTHROID) 75 MCG tablet Take 75 mcg by mouth daily before breakfast.      lovastatin (MEVACOR) 20 MG tablet Take 20 mg by mouth daily.      magnesium oxide (MAG-OX) 400 MG tablet Take 400 mg by mouth daily.     metoprolol succinate (TOPROL-XL) 25 MG 24 hr tablet Take 25 mg by mouth daily.      mirabegron ER (MYRBETRIQ) 25 MG TB24 tablet Myrbetriq 25 mg tablet,extended release     Multiple Vitamins-Minerals (MULTIVITAMINS THER. W/MINERALS) TABS Take 1 tablet by mouth daily.     nystatin ointment (MYCOSTATIN) nystatin 100,000 unit/gram topical ointment     nystatin-triamcinolone ointment (MYCOLOG) Apply 1 application topically daily as needed (itching).      Polyethyl Glycol-Propyl Glycol 0.4-0.3 % SOLN Apply 2 drops to eye 2 (two) times daily as needed (for dry eyes).     predniSONE (DELTASONE) 2.5 MG tablet Take 1 tablet on Mondays, Wednesdays and Fridays. Take 2 tablets on Tuesdays, Thursdays, Saturdays and Sundays. Take  in the morning with food 60 tablet 1   pregabalin (LYRICA) 50 MG capsule Take 1 capsule by mouth 2 (two) times daily.     Probiotic Product (PROBIOTIC PO) Take 1 tablet by mouth daily.     triamcinolone ointment (KENALOG) 0.1 % Apply 1 application topically 2 (two) times daily.     No current facility-administered medications for this visit.    SUMMARY OF ONCOLOGIC HISTORY: Oncology History   No history exists.    PHYSICAL EXAMINATION: ECOG PERFORMANCE STATUS: 2 - Symptomatic, <50% confined to bed  Vitals:   10/06/21 1437  BP: (!) 108/52  Pulse: 71  Resp: 18  Temp: 100 F (37.8 C)  SpO2: 92%   Filed Weights   10/06/21 1437  Weight: 174 lb 8 oz (79.2 kg)    GENERAL:alert, no distress and comfortable SKIN: Noted a scab over the right portion of her skull NEURO: alert & oriented x 3 with fluent speech, no focal motor/sensory deficits  LABORATORY DATA:  I have reviewed the data as listed  Component Value Date/Time   NA 137 10/01/2021 1320   NA 135 (L) 07/07/2016 1035   K 3.9 10/01/2021 1320   K 4.8 07/07/2016 1035   CL 104 10/01/2021 1320   CL 106 07/11/2012 0906   CO2 27 10/01/2021 1320   CO2 24 07/07/2016 1035   GLUCOSE 110 (H) 10/01/2021 1320   GLUCOSE 85 07/07/2016 1035   GLUCOSE 107 (H) 07/11/2012 0906   BUN 15 10/01/2021 1320   BUN 19.9 07/07/2016 1035   CREATININE 0.70 10/01/2021 1320   CREATININE 0.7 07/07/2016 1035   CALCIUM 10.4 (H) 10/01/2021 1320   CALCIUM 10.2 07/07/2016 1035   PROT 6.9 10/01/2021 1320   PROT 6.6 07/07/2016 1035   ALBUMIN 4.3 10/01/2021 1320   ALBUMIN 3.9 07/07/2016 1035   AST 17 10/01/2021 1320   AST 14 07/07/2016 1035   ALT 17 10/01/2021 1320   ALT 17 07/07/2016 1035   ALKPHOS 46 10/01/2021 1320   ALKPHOS 58 07/07/2016 1035   BILITOT 1.4 (H) 10/01/2021 1320   BILITOT 0.61 07/07/2016 1035   GFRNONAA >60 10/01/2021 1320   GFRAA >60 05/19/2018 1551    No results found for: SPEP, UPEP  Lab Results  Component Value  Date   WBC 7.7 10/06/2021   NEUTROABS 5.4 10/06/2021   HGB 11.8 (L) 10/06/2021   HCT 36.4 10/06/2021   MCV 86.1 10/06/2021   PLT 101 (L) 10/06/2021      Chemistry      Component Value Date/Time   NA 137 10/01/2021 1320   NA 135 (L) 07/07/2016 1035   K 3.9 10/01/2021 1320   K 4.8 07/07/2016 1035   CL 104 10/01/2021 1320   CL 106 07/11/2012 0906   CO2 27 10/01/2021 1320   CO2 24 07/07/2016 1035   BUN 15 10/01/2021 1320   BUN 19.9 07/07/2016 1035   CREATININE 0.70 10/01/2021 1320   CREATININE 0.7 07/07/2016 1035      Component Value Date/Time   CALCIUM 10.4 (H) 10/01/2021 1320   CALCIUM 10.2 07/07/2016 1035   ALKPHOS 46 10/01/2021 1320   ALKPHOS 58 07/07/2016 1035   AST 17 10/01/2021 1320   AST 14 07/07/2016 1035   ALT 17 10/01/2021 1320   ALT 17 07/07/2016 1035   BILITOT 1.4 (H) 10/01/2021 1320   BILITOT 0.61 07/07/2016 1035       RADIOGRAPHIC STUDIES: I have personally reviewed the radiological images as listed and agreed with the findings in the report. CT Head Wo Contrast  Result Date: 10/04/2021 CLINICAL DATA:  87 year old female status post fall in bathroom. Fell backwards. EXAM: CT HEAD WITHOUT CONTRAST TECHNIQUE: Contiguous axial images were obtained from the base of the skull through the vertex without intravenous contrast. RADIATION DOSE REDUCTION: This exam was performed according to the departmental dose-optimization program which includes automated exposure control, adjustment of the mA and/or kV according to patient size and/or use of iterative reconstruction technique. COMPARISON:  Head CT 07/14/2019. FINDINGS: Brain: Cerebral volume is not significantly changed since 2021. No midline shift, ventriculomegaly, mass effect, evidence of mass lesion, intracranial hemorrhage or evidence of cortically based acute infarction. Mild to moderate for age cerebral white matter hypodensity appears stable and most pronounced at the right frontal horn. Vascular: Calcified  atherosclerosis at the skull base. No suspicious intracranial vascular hyperdensity. Skull: Stable, intact. Sinuses/Orbits: Visualized paranasal sinuses and mastoids are stable and well aerated. Chronic right ethmoid osteoma (inconsequential). Mild to moderate chronic left sphenoid sinus mucosal thickening is stable. Tympanic cavities  appear clear. Other: Broad-based right posterosuperior convexity scalp hematoma with small volume soft tissue gas associated on series 3, image 60. Underlying calvarium appears intact. Other orbit and scalp soft tissues appears stable. IMPRESSION: 1. Right posterosuperior convexity scalp laceration and hematoma without underlying skull fracture. 2. No acute intracranial abnormality. Stable non contrast CT appearance of the brain since 2021. Electronically Signed   By: Genevie Ann M.D.   On: 10/04/2021 06:48

## 2021-10-06 NOTE — Assessment & Plan Note (Signed)
She had history of recurrent falls in the past She have no major injuries and appears to be recovering well I told the patient this is not related to prednisone

## 2021-10-06 NOTE — Assessment & Plan Note (Signed)
She has multifactorial anemia with component of iron deficiency The patient called to cancel her EGD evaluation today She told me she spoke with somebody who felt it was a great idea given her recent fall and concussion We discussed again the rationale of her being referred for EGD evaluation and the patient will call back to get her EGD rescheduled

## 2021-10-06 NOTE — Telephone Encounter (Signed)
EGD cancelled. Pt scheduled to see Dr. Carlean Purl 11/12/21 at 10:10am. Letter mailed to pt regarding appt.

## 2021-10-07 ENCOUNTER — Inpatient Hospital Stay: Payer: Medicare Other | Admitting: Hematology and Oncology

## 2021-10-07 DIAGNOSIS — W19XXXD Unspecified fall, subsequent encounter: Secondary | ICD-10-CM | POA: Diagnosis not present

## 2021-10-07 DIAGNOSIS — S060X0S Concussion without loss of consciousness, sequela: Secondary | ICD-10-CM | POA: Diagnosis not present

## 2021-10-07 DIAGNOSIS — S0003XD Contusion of scalp, subsequent encounter: Secondary | ICD-10-CM | POA: Diagnosis not present

## 2021-10-07 DIAGNOSIS — D649 Anemia, unspecified: Secondary | ICD-10-CM | POA: Diagnosis not present

## 2021-10-07 DIAGNOSIS — D696 Thrombocytopenia, unspecified: Secondary | ICD-10-CM | POA: Diagnosis not present

## 2021-10-07 DIAGNOSIS — R2689 Other abnormalities of gait and mobility: Secondary | ICD-10-CM | POA: Diagnosis not present

## 2021-10-07 NOTE — Telephone Encounter (Signed)
Patient called states she went to see her PCP provider and he did not restrict her from rescheduling the procedure.

## 2021-10-08 NOTE — Telephone Encounter (Signed)
Spoke with pt and she is aware of appt with Dr. Carlean Purl.

## 2021-10-13 DIAGNOSIS — M79671 Pain in right foot: Secondary | ICD-10-CM | POA: Diagnosis not present

## 2021-10-14 ENCOUNTER — Ambulatory Visit: Payer: Medicare Other | Admitting: Podiatry

## 2021-10-17 DIAGNOSIS — I7090 Unspecified atherosclerosis: Secondary | ICD-10-CM | POA: Diagnosis not present

## 2021-10-17 DIAGNOSIS — F17210 Nicotine dependence, cigarettes, uncomplicated: Secondary | ICD-10-CM | POA: Diagnosis not present

## 2021-10-17 DIAGNOSIS — Z9181 History of falling: Secondary | ICD-10-CM | POA: Diagnosis not present

## 2021-10-17 DIAGNOSIS — Z7982 Long term (current) use of aspirin: Secondary | ICD-10-CM | POA: Diagnosis not present

## 2021-10-17 DIAGNOSIS — M4800 Spinal stenosis, site unspecified: Secondary | ICD-10-CM | POA: Diagnosis not present

## 2021-10-17 DIAGNOSIS — I10 Essential (primary) hypertension: Secondary | ICD-10-CM | POA: Diagnosis not present

## 2021-10-17 DIAGNOSIS — F339 Major depressive disorder, recurrent, unspecified: Secondary | ICD-10-CM | POA: Diagnosis not present

## 2021-10-17 DIAGNOSIS — G629 Polyneuropathy, unspecified: Secondary | ICD-10-CM | POA: Diagnosis not present

## 2021-10-17 DIAGNOSIS — E039 Hypothyroidism, unspecified: Secondary | ICD-10-CM | POA: Diagnosis not present

## 2021-10-17 DIAGNOSIS — Z7952 Long term (current) use of systemic steroids: Secondary | ICD-10-CM | POA: Diagnosis not present

## 2021-10-17 DIAGNOSIS — I4510 Unspecified right bundle-branch block: Secondary | ICD-10-CM | POA: Diagnosis not present

## 2021-10-17 DIAGNOSIS — D696 Thrombocytopenia, unspecified: Secondary | ICD-10-CM | POA: Diagnosis not present

## 2021-10-17 DIAGNOSIS — S060X0S Concussion without loss of consciousness, sequela: Secondary | ICD-10-CM | POA: Diagnosis not present

## 2021-10-17 DIAGNOSIS — K219 Gastro-esophageal reflux disease without esophagitis: Secondary | ICD-10-CM | POA: Diagnosis not present

## 2021-10-17 DIAGNOSIS — D649 Anemia, unspecified: Secondary | ICD-10-CM | POA: Diagnosis not present

## 2021-10-17 DIAGNOSIS — M810 Age-related osteoporosis without current pathological fracture: Secondary | ICD-10-CM | POA: Diagnosis not present

## 2021-10-17 DIAGNOSIS — J449 Chronic obstructive pulmonary disease, unspecified: Secondary | ICD-10-CM | POA: Diagnosis not present

## 2021-10-17 DIAGNOSIS — Z853 Personal history of malignant neoplasm of breast: Secondary | ICD-10-CM | POA: Diagnosis not present

## 2021-10-17 DIAGNOSIS — S0003XD Contusion of scalp, subsequent encounter: Secondary | ICD-10-CM | POA: Diagnosis not present

## 2021-10-17 DIAGNOSIS — E785 Hyperlipidemia, unspecified: Secondary | ICD-10-CM | POA: Diagnosis not present

## 2021-10-17 DIAGNOSIS — G500 Trigeminal neuralgia: Secondary | ICD-10-CM | POA: Diagnosis not present

## 2021-10-22 ENCOUNTER — Encounter: Payer: Self-pay | Admitting: Podiatry

## 2021-10-23 DIAGNOSIS — S0003XD Contusion of scalp, subsequent encounter: Secondary | ICD-10-CM | POA: Diagnosis not present

## 2021-10-23 DIAGNOSIS — F17210 Nicotine dependence, cigarettes, uncomplicated: Secondary | ICD-10-CM | POA: Diagnosis not present

## 2021-10-23 DIAGNOSIS — S060X0S Concussion without loss of consciousness, sequela: Secondary | ICD-10-CM | POA: Diagnosis not present

## 2021-10-23 DIAGNOSIS — E785 Hyperlipidemia, unspecified: Secondary | ICD-10-CM | POA: Diagnosis not present

## 2021-10-23 DIAGNOSIS — E039 Hypothyroidism, unspecified: Secondary | ICD-10-CM | POA: Diagnosis not present

## 2021-10-23 DIAGNOSIS — I7090 Unspecified atherosclerosis: Secondary | ICD-10-CM | POA: Diagnosis not present

## 2021-10-24 DIAGNOSIS — S060X0S Concussion without loss of consciousness, sequela: Secondary | ICD-10-CM | POA: Diagnosis not present

## 2021-10-24 DIAGNOSIS — E785 Hyperlipidemia, unspecified: Secondary | ICD-10-CM | POA: Diagnosis not present

## 2021-10-24 DIAGNOSIS — S0003XD Contusion of scalp, subsequent encounter: Secondary | ICD-10-CM | POA: Diagnosis not present

## 2021-10-24 DIAGNOSIS — F17210 Nicotine dependence, cigarettes, uncomplicated: Secondary | ICD-10-CM | POA: Diagnosis not present

## 2021-10-24 DIAGNOSIS — I7090 Unspecified atherosclerosis: Secondary | ICD-10-CM | POA: Diagnosis not present

## 2021-10-24 DIAGNOSIS — E039 Hypothyroidism, unspecified: Secondary | ICD-10-CM | POA: Diagnosis not present

## 2021-10-27 ENCOUNTER — Inpatient Hospital Stay (HOSPITAL_BASED_OUTPATIENT_CLINIC_OR_DEPARTMENT_OTHER): Payer: Medicare Other | Admitting: Hematology and Oncology

## 2021-10-27 ENCOUNTER — Other Ambulatory Visit: Payer: Self-pay

## 2021-10-27 ENCOUNTER — Encounter: Payer: Self-pay | Admitting: Hematology and Oncology

## 2021-10-27 ENCOUNTER — Inpatient Hospital Stay: Payer: Medicare Other

## 2021-10-27 DIAGNOSIS — R296 Repeated falls: Secondary | ICD-10-CM | POA: Diagnosis not present

## 2021-10-27 DIAGNOSIS — Z79899 Other long term (current) drug therapy: Secondary | ICD-10-CM | POA: Diagnosis not present

## 2021-10-27 DIAGNOSIS — D638 Anemia in other chronic diseases classified elsewhere: Secondary | ICD-10-CM | POA: Diagnosis not present

## 2021-10-27 DIAGNOSIS — S0101XA Laceration without foreign body of scalp, initial encounter: Secondary | ICD-10-CM | POA: Diagnosis not present

## 2021-10-27 DIAGNOSIS — R2689 Other abnormalities of gait and mobility: Secondary | ICD-10-CM | POA: Diagnosis not present

## 2021-10-27 DIAGNOSIS — D693 Immune thrombocytopenic purpura: Secondary | ICD-10-CM

## 2021-10-27 LAB — CBC WITH DIFFERENTIAL/PLATELET
Abs Immature Granulocytes: 0.04 10*3/uL (ref 0.00–0.07)
Basophils Absolute: 0.1 10*3/uL (ref 0.0–0.1)
Basophils Relative: 1 %
Eosinophils Absolute: 0.1 10*3/uL (ref 0.0–0.5)
Eosinophils Relative: 1 %
HCT: 36.4 % (ref 36.0–46.0)
Hemoglobin: 11.6 g/dL — ABNORMAL LOW (ref 12.0–15.0)
Immature Granulocytes: 0 %
Lymphocytes Relative: 12 %
Lymphs Abs: 1.2 10*3/uL (ref 0.7–4.0)
MCH: 27.7 pg (ref 26.0–34.0)
MCHC: 31.9 g/dL (ref 30.0–36.0)
MCV: 86.9 fL (ref 80.0–100.0)
Monocytes Absolute: 0.6 10*3/uL (ref 0.1–1.0)
Monocytes Relative: 6 %
Neutro Abs: 8.1 10*3/uL — ABNORMAL HIGH (ref 1.7–7.7)
Neutrophils Relative %: 80 %
Platelets: 150 10*3/uL (ref 150–400)
RBC: 4.19 MIL/uL (ref 3.87–5.11)
RDW: 15.3 % (ref 11.5–15.5)
WBC: 10.2 10*3/uL (ref 4.0–10.5)
nRBC: 0 % (ref 0.0–0.2)

## 2021-10-27 MED ORDER — PREDNISONE 2.5 MG PO TABS: 2.5000 mg | ORAL_TABLET | Freq: Every day | ORAL | 1 refills | Status: DC

## 2021-10-27 NOTE — Progress Notes (Signed)
[facility] OFFICE PROGRESS NOTE  Darlene Sanger, FNP  ASSESSMENT & PLAN:  Chronic ITP (idiopathic thrombocytopenia) (HCC) Her platelet count is normal She is interested for prednisone taper I recommend simplifying her current prescription to 2.5 mg daily and bring her back in 2 weeks for further follow-up  Anemia, chronic disease She has multifactorial anemia with component of iron deficiency She has appointment pending to see gastroenterologist for evaluation  Recurrent falls She had history of recurrent falls Apparently, physical therapy has been consulted for therapy  No orders of the defined types were placed in this encounter.   The total time spent in the appointment was 20 minutes encounter with patients including review of chart and various tests results, discussions about plan of care and coordination of care plan   All questions were answered. The patient knows to call the clinic with any problems, questions or concerns. No barriers to learning was detected.    Artis Delay, MD 6/26/20231:37 PM  INTERVAL HISTORY: MARLISSA [name] 87 y.o. female returns for follow-up on chronic ITP She fell approximately a week ago without major injuries She has appointment to see gastroenterologist for evaluation The patient has difficulty understanding the rationale behind some of these medications Her daughter is present throughout the visit Ultimately, the patient is interested for prednisone taper  SUMMARY OF HEMATOLOGIC HISTORY:  Darlene [name] was transferred to my care after her prior physician has left.  I reviewed the patient's records extensive and collaborated the history with the patient. 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 She never required any systemic treatment.  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 This has also resolved on its own. She has never developed any additional adenopathy other than the single lymph node palpable in the left supraclavicular fossa.  She did develop a second primary stage I,  0.8 cm,  ER negative cancer of the right breast, treated with lumpectomy, radiation and six cycles of CMF chemotherapy in 1994. No evidence of a new disease from the breast cancer either. Most recent mammogram done in June 2015 showed stable fibroglandular changes in her breasts. She was called back for an ultrasound of the left breast which also came back benign. She had a CT scan of the abdomen done on 11/14/15 because of significant intentional weight loss and abdominal discomfort.  Imaging studies show no evidence of lymphoma.  Moderate stool burden was noted. Repeat CT scan in March 2019 show stable lymphadenopathy with no signs or symptoms of cancer recurrence On 01/26/2018, she underwent transurethral bladder resection of the tumor by urologist Between 2019-2023, she had recurrent ITP relapse  I have reviewed the past medical history, past surgical history, social history and family history with the patient and they are unchanged from previous note.  ALLERGIES:  is allergic to oxycodone, sulfamethoxazole-trimethoprim, codeine, doxycycline, meperidine and related, sulfa antibiotics, and sulfacetamide sodium.  MEDICATIONS:  Current Outpatient Medications  Medication Sig Dispense Refill   acetaminophen (TYLENOL) 500 MG tablet Take 1 tablet (500 mg total) by mouth every 6 (six) hours as needed for mild pain. (Patient taking differently: Take 1,000 mg by mouth every 8 (eight) hours as needed for mild pain.) 30 tablet 0   alclomethasone (ACLOVATE) 0.05 % cream SMARTSIG:1 Topical Every Night     amoxicillin (  AMOXIL) 500 MG capsule Take 2,000 mg by mouth once as needed (One hour prior to dental  appointment).  (Patient not taking: Reported on 09/02/2021)     aspirin 81 MG tablet Take 81 mg by mouth daily.     augmented betamethasone dipropionate (DIPROLENE-AF) 0.05 % ointment Apply 1 application topically daily as needed (itching).      Calcium Carbonate-Vit D-Min (CALTRATE 600+D PLUS MINERALS) 600-800 MG-UNIT CHEW Chew 2 each by mouth 2 (two) times daily.     cholecalciferol (VITAMIN D) 1000 UNITS tablet Take 1,000 Units by mouth daily.     citalopram (CELEXA) 40 MG tablet Take 40 mg by mouth daily.      Coenzyme Q10 (CO Q-10 PO) Take 1 tablet by mouth daily.     Cyanocobalamin (VITAMIN B-12 PO) Take 1,000 mcg by mouth daily.     diazepam (VALIUM) 5 MG tablet Take 1 tablet (5 mg total) by mouth every 6 (six) hours as needed for anxiety (Take 30-min prior MRI). 1 tablet 0   diclofenac Sodium (VOLTAREN) 1 % GEL diclofenac 1 % topical gel     Difluprednate 0.05 % EMUL Place 2 drops into the right eye 2 (two) times daily.      ergocalciferol (VITAMIN D2) 1.25 MG (50000 UT) capsule      esomeprazole (NEXIUM) 40 MG capsule Take 40 mg by mouth 2 (two) times daily before a meal.     famciclovir (FAMVIR) 125 MG tablet      Iron, Ferrous Sulfate, 325 (65 Fe) MG TABS Take 1 tablet by mouth daily.     L-Methylfolate-Algae-B12-B6 (METANX) 3-90.314-2-35 MG CAPS      levothyroxine (SYNTHROID, LEVOTHROID) 75 MCG tablet Take 75 mcg by mouth daily before breakfast.      lovastatin (MEVACOR) 20 MG tablet Take 20 mg by mouth daily.      magnesium oxide (MAG-OX) 400 MG tablet Take 400 mg by mouth daily.     metoprolol succinate (TOPROL-XL) 25 MG 24 hr tablet Take 25 mg by mouth daily.      mirabegron ER (MYRBETRIQ) 25 MG TB24 tablet Myrbetriq 25 mg tablet,extended release     Multiple Vitamins-Minerals (MULTIVITAMINS THER. W/MINERALS) TABS Take 1 tablet by mouth daily.     nystatin ointment (MYCOSTATIN) nystatin 100,000 unit/gram topical ointment     nystatin-triamcinolone ointment (MYCOLOG) Apply 1  application topically daily as needed (itching).      Polyethyl Glycol-Propyl Glycol 0.4-0.3 % SOLN Apply 2 drops to eye 2 (two) times daily as needed (for dry eyes).     predniSONE (DELTASONE) 2.5 MG tablet Take 1 tablet (2.5 mg total) by mouth daily with breakfast. 60 tablet 1   pregabalin (LYRICA) 50 MG capsule Take 1 capsule by mouth 2 (two) times daily.     Probiotic Product (PROBIOTIC PO) Take 1 tablet by mouth daily.     triamcinolone ointment (KENALOG) 0.1 % Apply 1 application topically 2 (two) times daily.     No current facility-administered medications for this visit.     REVIEW OF SYSTEMS:   Constitutional: Denies fevers, chills or night sweats Eyes: Denies blurriness of vision Ears, nose, mouth, throat, and face: Denies mucositis or sore throat Respiratory: Denies cough, dyspnea or wheezes Cardiovascular: Denies palpitation, chest discomfort or lower extremity swelling Gastrointestinal:  Denies nausea, heartburn or change in bowel habits Skin: Denies abnormal skin rashes Lymphatics: Denies new lymphadenopathy or easy bruising Neurological:Denies numbness, tingling or new weaknesses Behavioral/Psych: Mood is stable, no new changes  All other systems were reviewed with the patient and are negative.  PHYSICAL EXAMINATION: ECOG PERFORMANCE STATUS: 1 - Symptomatic but completely ambulatory  Vitals:   10/27/21 1135  BP: 122/80  Pulse: 70  Resp: 18  Temp: 97.9 F (36.6 C)  SpO2: 96%   Filed Weights   10/27/21 1135  Weight: 175 lb 6.4 oz (79.6 kg)    GENERAL:alert, no distress and comfortable NEURO: alert & oriented x 3 with fluent speech, no focal motor/sensory deficits  LABORATORY DATA:  I have reviewed the data as listed     Component Value Date/Time   NA 137 10/01/2021 1320   NA 135 (L) 07/07/2016 1035   K 3.9 10/01/2021 1320   K 4.8 07/07/2016 1035   CL 104 10/01/2021 1320   CL 106 07/11/2012 0906   CO2 27 10/01/2021 1320   CO2 24 07/07/2016 1035    GLUCOSE 110 (H) 10/01/2021 1320   GLUCOSE 85 07/07/2016 1035   GLUCOSE 107 (H) 07/11/2012 0906   BUN 15 10/01/2021 1320   BUN 19.9 07/07/2016 1035   CREATININE 0.70 10/01/2021 1320   CREATININE 0.7 07/07/2016 1035   CALCIUM 10.4 (H) 10/01/2021 1320   CALCIUM 10.2 07/07/2016 1035   PROT 6.9 10/01/2021 1320   PROT 6.6 07/07/2016 1035   ALBUMIN 4.3 10/01/2021 1320   ALBUMIN 3.9 07/07/2016 1035   AST 17 10/01/2021 1320   AST 14 07/07/2016 1035   ALT 17 10/01/2021 1320   ALT 17 07/07/2016 1035   ALKPHOS 46 10/01/2021 1320   ALKPHOS 58 07/07/2016 1035   BILITOT 1.4 (H) 10/01/2021 1320   BILITOT 0.61 07/07/2016 1035   GFRNONAA >60 10/01/2021 1320   GFRAA >60 05/19/2018 1551    No results found for: "SPEP", "UPEP"  Lab Results  Component Value Date   WBC 10.2 10/27/2021   NEUTROABS 8.1 (H) 10/27/2021   HGB 11.6 (L) 10/27/2021   HCT 36.4 10/27/2021   MCV 86.9 10/27/2021   PLT 150 10/27/2021      Chemistry      Component Value Date/Time   NA 137 10/01/2021 1320   NA 135 (L) 07/07/2016 1035   K 3.9 10/01/2021 1320   K 4.8 07/07/2016 1035   CL 104 10/01/2021 1320   CL 106 07/11/2012 0906   CO2 27 10/01/2021 1320   CO2 24 07/07/2016 1035   BUN 15 10/01/2021 1320   BUN 19.9 07/07/2016 1035   CREATININE 0.70 10/01/2021 1320   CREATININE 0.7 07/07/2016 1035      Component Value Date/Time   CALCIUM 10.4 (H) 10/01/2021 1320   CALCIUM 10.2 07/07/2016 1035   ALKPHOS 46 10/01/2021 1320   ALKPHOS 58 07/07/2016 1035   AST 17 10/01/2021 1320   AST 14 07/07/2016 1035   ALT 17 10/01/2021 1320   ALT 17 07/07/2016 1035   BILITOT 1.4 (H) 10/01/2021 1320   BILITOT 0.61 07/07/2016 1035

## 2021-10-29 DIAGNOSIS — R2681 Unsteadiness on feet: Secondary | ICD-10-CM | POA: Diagnosis not present

## 2021-10-29 DIAGNOSIS — R413 Other amnesia: Secondary | ICD-10-CM | POA: Diagnosis not present

## 2021-10-29 DIAGNOSIS — R296 Repeated falls: Secondary | ICD-10-CM | POA: Diagnosis not present

## 2021-10-29 DIAGNOSIS — R2689 Other abnormalities of gait and mobility: Secondary | ICD-10-CM | POA: Diagnosis not present

## 2021-10-29 DIAGNOSIS — S0990XA Unspecified injury of head, initial encounter: Secondary | ICD-10-CM | POA: Diagnosis not present

## 2021-10-30 ENCOUNTER — Other Ambulatory Visit: Payer: Self-pay | Admitting: Registered Nurse

## 2021-10-30 DIAGNOSIS — S0990XA Unspecified injury of head, initial encounter: Secondary | ICD-10-CM

## 2021-10-30 DIAGNOSIS — E785 Hyperlipidemia, unspecified: Secondary | ICD-10-CM | POA: Diagnosis not present

## 2021-10-30 DIAGNOSIS — Z1231 Encounter for screening mammogram for malignant neoplasm of breast: Secondary | ICD-10-CM

## 2021-10-30 DIAGNOSIS — I7090 Unspecified atherosclerosis: Secondary | ICD-10-CM | POA: Diagnosis not present

## 2021-10-30 DIAGNOSIS — S060X0S Concussion without loss of consciousness, sequela: Secondary | ICD-10-CM | POA: Diagnosis not present

## 2021-10-30 DIAGNOSIS — E039 Hypothyroidism, unspecified: Secondary | ICD-10-CM | POA: Diagnosis not present

## 2021-10-30 DIAGNOSIS — S0003XD Contusion of scalp, subsequent encounter: Secondary | ICD-10-CM | POA: Diagnosis not present

## 2021-10-30 DIAGNOSIS — F17210 Nicotine dependence, cigarettes, uncomplicated: Secondary | ICD-10-CM | POA: Diagnosis not present

## 2021-10-31 ENCOUNTER — Ambulatory Visit: Payer: Medicare Other | Admitting: Podiatry

## 2021-10-31 ENCOUNTER — Ambulatory Visit
Admission: RE | Admit: 2021-10-31 | Discharge: 2021-10-31 | Disposition: A | Discharge status: Home / Self Care | Payer: Medicare Other | Source: Ambulatory Visit | Attending: Registered Nurse | Admitting: Registered Nurse

## 2021-10-31 DIAGNOSIS — R410 Disorientation, unspecified: Secondary | ICD-10-CM | POA: Diagnosis not present

## 2021-10-31 DIAGNOSIS — S0990XA Unspecified injury of head, initial encounter: Secondary | ICD-10-CM

## 2021-10-31 DIAGNOSIS — R4182 Altered mental status, unspecified: Secondary | ICD-10-CM | POA: Diagnosis not present

## 2021-11-05 DIAGNOSIS — S0003XD Contusion of scalp, subsequent encounter: Secondary | ICD-10-CM | POA: Diagnosis not present

## 2021-11-05 DIAGNOSIS — E785 Hyperlipidemia, unspecified: Secondary | ICD-10-CM | POA: Diagnosis not present

## 2021-11-05 DIAGNOSIS — S060X0S Concussion without loss of consciousness, sequela: Secondary | ICD-10-CM | POA: Diagnosis not present

## 2021-11-05 DIAGNOSIS — E039 Hypothyroidism, unspecified: Secondary | ICD-10-CM | POA: Diagnosis not present

## 2021-11-05 DIAGNOSIS — I7090 Unspecified atherosclerosis: Secondary | ICD-10-CM | POA: Diagnosis not present

## 2021-11-05 DIAGNOSIS — F17210 Nicotine dependence, cigarettes, uncomplicated: Secondary | ICD-10-CM | POA: Diagnosis not present

## 2021-11-07 ENCOUNTER — Encounter: Payer: Self-pay | Admitting: Physician Assistant

## 2021-11-07 ENCOUNTER — Ambulatory Visit (INDEPENDENT_AMBULATORY_CARE_PROVIDER_SITE_OTHER): Payer: Medicare Other | Admitting: Physician Assistant

## 2021-11-07 VITALS — BP 112/68 | HR 79 | Wt 171.0 lb

## 2021-11-07 DIAGNOSIS — K449 Diaphragmatic hernia without obstruction or gangrene: Secondary | ICD-10-CM

## 2021-11-07 DIAGNOSIS — D509 Iron deficiency anemia, unspecified: Secondary | ICD-10-CM | POA: Diagnosis not present

## 2021-11-07 DIAGNOSIS — K219 Gastro-esophageal reflux disease without esophagitis: Secondary | ICD-10-CM

## 2021-11-07 NOTE — Progress Notes (Signed)
Subjective:    Patient ID: Darlene [name], female    DOB: [birth year], 87 y.o.   MRN: [id number]  HPI Darlene [name] is a very nice 87 year old white female, established with Dr. Carlean Purl.  She was initially seen here most recently 09/02/2021 by Carl Best, NP in referral from hematology/Dr. Alvy Bimler. for consideration of endoscopy for evaluation of iron deficiency.  Patient has history of chronic anemia, and thrombocytopenia secondary to ITP. Also with history of breast cancer, 2013, non-Hodgkin's lymphoma, renal cancer, hypertension, COPD, hypothyroidism, right bundle branch block. She had last undergone colonoscopy here in September 2015 which was normal. She has had no reports of abdominal pain, no melena or hematochezia.  No nausea or vomiting.  She does have history of GERD, no active symptoms and reports that she is taking Nexium 40 mg once daily. She was scheduled for upper endoscopy, however on 10/06/2021 she fell and sustained a concussion.  She called here and procedure was canceled, and she comes back in today for discussion. Unfortunately she is now having problems with frequent falls at home and says she also fell outside and hit her head on the concrete about 2 weeks ago  Review of Systems. Pertinent positive and negative review of systems were noted in the above HPI section.  All other review of systems was otherwise negative.  She did not have any syncope with that episode and no bleeding so did not go to the emergency room but then was seen later on 10/31/2021 and had CT of the head done which did not show any acute intracranial abnormality or significant interval change, stable atrophy and white matter disease reflecting sequelae of chronic microvascular ischemia. Patient says she is having more problems with her balance, and despite using support at home she fell again this week inside her house.  She was able to drive herself here today but required a wheelchair to get up to the  third floor.  Reviewing recent work-up in April 2023 CT of the abdomen pelvis showed some persistent mild to moderate periaortic adenopathy otherwise unremarkable. She did Cologuard which was negative in 2020. Most recent labs June 2023 showed hemoglobin 11.6/hematocrit 36.4/MCV 86/platelets 150.  This is while on low-dose prednisone. Iron studies had been done 07/28/2021 serum iron 47/iron sat 10/TIBC 487 She has been on an oral iron supplement once daily since   Reviewing her labs I do not see that she has had any iron studies done in the previous 4 to 5 years, and her anemia has been stable. August 2022 hemoglobin 10.7/hematocrit 33.1/MCV 84 2020 hemoglobin 11.6/hematocrit 36.8/platelets 115 2017 hemoglobin 11.5/hematocrit 35/MCV 89/platelets 101.  Outpatient Encounter Medications as of 11/07/2021  Medication Sig   acetaminophen (TYLENOL) 500 MG tablet Take 1 tablet (500 mg total) by mouth every 6 (six) hours as needed for mild pain.   amoxicillin (AMOXIL) 500 MG capsule Take 2,000 mg by mouth once as needed (One hour prior to dental appointment).   aspirin 81 MG tablet Take 81 mg by mouth daily.   cholecalciferol (VITAMIN D) 1000 UNITS tablet Take 1,000 Units by mouth daily.   citalopram (CELEXA) 40 MG tablet Take 40 mg by mouth daily.    Coenzyme Q10 (CO Q-10 PO) Take 1 tablet by mouth daily.   Cyanocobalamin (VITAMIN B-12 PO) Take 1,000 mcg by mouth daily.   diazepam (VALIUM) 5 MG tablet Take 1 tablet (5 mg total) by mouth every 6 (six) hours as needed for anxiety (Take 30-min prior MRI).  diclofenac Sodium (VOLTAREN) 1 % GEL diclofenac 1 % topical gel   Difluprednate 0.05 % EMUL Place 2 drops into the right eye 2 (two) times daily.    ergocalciferol (VITAMIN D2) 1.25 MG (50000 UT) capsule    esomeprazole (NEXIUM) 40 MG capsule Take 40 mg by mouth 2 (two) times daily before a meal.   famciclovir (FAMVIR) 125 MG tablet    Iron, Ferrous Sulfate, 325 (65 Fe) MG TABS Take 1 tablet by  mouth daily.   L-Methylfolate-Algae-B12-B6 (METANX) 3-90.314-2-35 MG CAPS    levothyroxine (SYNTHROID, LEVOTHROID) 75 MCG tablet Take 75 mcg by mouth daily before breakfast.    lovastatin (MEVACOR) 20 MG tablet Take 20 mg by mouth daily.    magnesium oxide (MAG-OX) 400 MG tablet Take 400 mg by mouth daily.   metoprolol succinate (TOPROL-XL) 25 MG 24 hr tablet Take 25 mg by mouth daily.    mirabegron ER (MYRBETRIQ) 25 MG TB24 tablet Myrbetriq 25 mg tablet,extended release   Multiple Vitamins-Minerals (MULTIVITAMINS THER. W/MINERALS) TABS Take 1 tablet by mouth daily.   nystatin ointment (MYCOSTATIN) nystatin 100,000 unit/gram topical ointment   nystatin-triamcinolone ointment (MYCOLOG) Apply 1 application topically daily as needed (itching).    Polyethyl Glycol-Propyl Glycol 0.4-0.3 % SOLN Apply 2 drops to eye 2 (two) times daily as needed (for dry eyes).   predniSONE (DELTASONE) 2.5 MG tablet Take 1 tablet (2.5 mg total) by mouth daily with breakfast.   pregabalin (LYRICA) 50 MG capsule Take 1 capsule by mouth 2 (two) times daily.   Probiotic Product (PROBIOTIC PO) Take 1 tablet by mouth daily.   triamcinolone ointment (KENALOG) 0.1 % Apply 1 application topically 2 (two) times daily.   alclomethasone (ACLOVATE) 0.05 % cream SMARTSIG:1 Topical Every Night (Patient not taking: Reported on 11/07/2021)   augmented betamethasone dipropionate (DIPROLENE-AF) 0.05 % ointment Apply 1 application topically daily as needed (itching).  (Patient not taking: Reported on 11/07/2021)   Calcium Carbonate-Vit D-Min (CALTRATE 600+D PLUS MINERALS) 600-800 MG-UNIT CHEW Chew 2 each by mouth 2 (two) times daily. (Patient not taking: Reported on 11/07/2021)   No facility-administered encounter medications on file as of 11/07/2021.   Allergies  Allergen Reactions   Oxycodone Other (See Comments)    Made her depressed   Sulfamethoxazole-Trimethoprim     Other reaction(s): Unknown   Codeine Itching             Doxycycline Swelling and Other (See Comments)    Burns throat also   Meperidine And Related Itching and Rash    Makes cheeks red also   Sulfa Antibiotics Itching   Sulfacetamide Sodium Itching   Patient Active Problem List   Diagnosis Date Noted   History of laceration of skin 08/22/2021   Low-level of literacy 07/03/2021   Right bundle branch block 03/04/2021   Abnormal gait 12/20/2020   Chronic obstructive pulmonary disease, unspecified ([place]) 12/20/2020   Chronic pain 12/20/2020   Essential hypertension 12/20/2020   Gastroesophageal reflux disease 12/20/2020   Hardening of the aorta (main artery of the heart) ([place]) 12/20/2020   Localized, primary osteoarthritis of shoulder region 12/20/2020   Non-Hodgkin lymphoma, unspecified, lymph nodes of head, face, and neck ([place]) 12/20/2020   Peripheral neuropathy 12/20/2020   History of fall 12/20/2020   Postmenopausal state 12/20/2020   Pure hypercholesterolemia 12/20/2020   Major depression, single episode 12/20/2020   Thrombocytopenia ([place]) 12/20/2020   Tobacco user 12/20/2020   Vitamin D deficiency 12/20/2020   Deficiency anemia 12/05/2020   Recurrent falls  11/16/2019   Muscle weakness 05/18/2019   Lumbar radiculopathy 05/18/2019   Hypotension 11/15/2018   Cholesteatoma of external auditory canal, left 06/28/2018   Presbycusis of both ears 06/10/2018   Muscle strain of left shoulder 04/13/2018   Left shoulder pain 02/08/2018   History of bladder cancer 02/08/2018   Anemia, chronic disease 07/27/2017   Ear itching 12/14/2016   Bilateral impacted cerumen 11/12/2016   Pain in right wrist 01/12/2016   Stiffness of right wrist joint 01/12/2016   Closed fracture of lower end of right radius with routine healing 12/30/2015   Weight loss 11/19/2015   Chronic constipation 11/19/2015   Other specified hypothyroidism 11/14/2015   DJD (degenerative joint disease) of knee 06/19/2013   Right knee DJD    Hyperlipidemia    Thyroid  disease    Chest pain 12/26/2012   Unstable angina ([place]) 12/03/2012   Osteoporosis 12/03/2012   Palpitations 12/03/2012   Dyslipidemia 12/03/2012   History of breast cancer in female 07/06/2011   History of non-Hodgkin's lymphoma 07/06/2011   Hypothyroid 07/06/2011   Chronic ITP (idiopathic thrombocytopenia) ([place]) 07/06/2011   Social History   Socioeconomic History   Marital status: Widowed    Spouse name: Not on file   Number of children: 2   Years of education: HS   Highest education level: Not on file  Occupational History   Occupation: Retired  Tobacco Use   Smoking status: Former    Packs/day: 0.30    Types: Cigarettes    Quit date: 1985    Years since quitting: 38.5   Smokeless tobacco: Never   Tobacco comments:    quit smoking 1985  was a casual smoker  Vaping Use   Vaping Use: Never used  Substance and Sexual Activity   Alcohol use: No    Alcohol/week: 0.0 standard drinks of alcohol   Drug use: No   Sexual activity: Not Currently    Birth control/protection: Post-menopausal  Other Topics Concern   Not on file  Social History Narrative   Lives at home alone.   Right-handed.   4 cups of caffeine daily.   Social Determinants of Health   Financial Resource Strain: Not on file  Food Insecurity: Not on file  Transportation Needs: Not on file  Physical Activity: Not on file  Stress: Not on file  Social Connections: Not on file  Intimate Partner Violence: Not on file    Ms. Ruggles's family history includes Cancer in her brother; Diabetes in her son; Heart disease in her father, maternal grandfather, and sister; Hyperlipidemia in her father; Hypertension in her daughter; Mood Disorder in her daughter; Other in her mother.      Objective:    Vitals:   11/07/21 1400  BP: 112/68  Pulse: 79    Physical Exam Well-developed well-nourished elderly white female in a wheelchair in no acute distress.  Very pleasant  Weight, 171 BMI 33.4  HEENT; nontraumatic  normocephalic, EOMI, PE R LA, sclera anicteric. Oropharynx; not examined today Neck; supple, no JVD Cardiovascular; regular rate and rhythm with S1-S2, no murmur rub or gallop Pulmonary; Clear bilaterally Abdomen; soft, obese, nontender, nondistended, no palpable mass or hepatosplenomegaly, bowel sounds are active Rectal; not done today Skin; benign exam, no jaundice rash or appreciable lesions Extremities; no clubbing cyanosis or edema skin warm and dry Neuro/Psych; alert and oriented x4, grossly nonfocal mood and affect appropriate        Assessment & Plan:   #77 87 year old white female with iron  deficiency in setting of chronic anemia and ITP. Patient had not been on chronic iron replacement and did not have any prior iron studies for comparison. She is now on an oral iron supplement over the past 2 months Overall her anemia has been very stable over the past couple of years  I think likelihood of her having any significant upper GI pathology to account for iron deficiency on EGD is low. If we were to entertain endoscopic evaluation colonoscopy ought to be considered as well as this was last done in 2015. However due to her multiple comorbidities, and 2 recent significant falls both resulting in head injury, I think the risk putting her through any sedation/endoscopic procedures at this point outweighs potential benefit as she has not had any evidence of bleeding or any drop in hemoglobin.  We will continue Nexium 40 mg p.o. daily   #2 multiple recent falls-increasing frailty/debilitation #3 ITP currently on low-dose steroids #4 history of non-Hodgkin's lymphoma #5 remote history of breast cancer 2013 #6 hypothyroidism 7.  COPD 8.  Hypertension 9.  Right bundle branch block  Plan; as above do not plan to seed with endoscopic evaluation at this time Continue Nexium 40 mg p.o. daily Continue oral iron supplement once daily We will plan to repeat iron studies in 2 months and  repeat CBC.  I think if she can show signs of repeating her iron stores then she can be monitored and will not require endoscopic evaluation   Kaylene Dawn Genia Harold PA-C 11/07/2021   Cc: Holland Commons, FNP

## 2021-11-07 NOTE — Patient Instructions (Addendum)
If you are age 85 or older, your body mass index should be between 23-30. Your Body mass index is 33.4 kg/m. If this is out of the aforementioned range listed, please consider follow up with your Primary Care Provider. ________________________________________________________  The [facility] providers would like to encourage you to use [facility] to communicate with providers for non-urgent requests or questions.  Due to long hold times on the telephone, sending your provider a message by [facility] may be a faster and more efficient way to get a response.  Please allow 48 business hours for a response.  Please remember that this is for non-urgent requests.  _______________________________________________________  Continue your Iron supplement every day  Continue Nexium 40 mg 1 capsule daily  We canceled the appointment that was scheduled for 11/12/2021.  You have been scheduled to follow up with Dr. Carlean Purl on January 06, 2022 at 10:50 am  Thank you for entrusting me with your care and choosing [facility].  Amy Esterwood, PA-C

## 2021-11-10 ENCOUNTER — Telehealth: Payer: Self-pay | Admitting: *Deleted

## 2021-11-10 NOTE — Telephone Encounter (Signed)
Darlene [name]/pt's daughter LVM stating she wanted to ask Dr. Alvy Bimler if Prednisone could be stopped "cold [place]".  Attempted to contact daughter - LVM: Please discuss with Dr. Alvy Bimler in AM as mother has appt with her 11/11/21 at 1120.

## 2021-11-11 ENCOUNTER — Other Ambulatory Visit: Payer: Self-pay

## 2021-11-11 ENCOUNTER — Encounter: Payer: Self-pay | Admitting: Hematology and Oncology

## 2021-11-11 ENCOUNTER — Inpatient Hospital Stay (HOSPITAL_BASED_OUTPATIENT_CLINIC_OR_DEPARTMENT_OTHER): Payer: Medicare Other | Admitting: Hematology and Oncology

## 2021-11-11 ENCOUNTER — Inpatient Hospital Stay: Payer: Medicare Other | Attending: Hematology and Oncology

## 2021-11-11 DIAGNOSIS — D638 Anemia in other chronic diseases classified elsewhere: Secondary | ICD-10-CM | POA: Diagnosis not present

## 2021-11-11 DIAGNOSIS — Z79899 Other long term (current) drug therapy: Secondary | ICD-10-CM | POA: Insufficient documentation

## 2021-11-11 DIAGNOSIS — D693 Immune thrombocytopenic purpura: Secondary | ICD-10-CM

## 2021-11-11 LAB — CBC WITH DIFFERENTIAL/PLATELET
Abs Immature Granulocytes: 0.04 10*3/uL (ref 0.00–0.07)
Basophils Absolute: 0.1 10*3/uL (ref 0.0–0.1)
Basophils Relative: 1 %
Eosinophils Absolute: 0.1 10*3/uL (ref 0.0–0.5)
Eosinophils Relative: 1 %
HCT: 38.4 % (ref 36.0–46.0)
Hemoglobin: 12.5 g/dL (ref 12.0–15.0)
Immature Granulocytes: 0 %
Lymphocytes Relative: 12 %
Lymphs Abs: 1.3 10*3/uL (ref 0.7–4.0)
MCH: 28.3 pg (ref 26.0–34.0)
MCHC: 32.6 g/dL (ref 30.0–36.0)
MCV: 87.1 fL (ref 80.0–100.0)
Monocytes Absolute: 0.8 10*3/uL (ref 0.1–1.0)
Monocytes Relative: 7 %
Neutro Abs: 8.4 10*3/uL — ABNORMAL HIGH (ref 1.7–7.7)
Neutrophils Relative %: 79 %
Platelets: 144 10*3/uL — ABNORMAL LOW (ref 150–400)
RBC: 4.41 MIL/uL (ref 3.87–5.11)
RDW: 14.5 % (ref 11.5–15.5)
WBC: 10.6 10*3/uL — ABNORMAL HIGH (ref 4.0–10.5)
nRBC: 0 % (ref 0.0–0.2)

## 2021-11-11 NOTE — Progress Notes (Signed)
[name] OFFICE PROGRESS NOTE  Patient Care Team: Holland Commons, FNP as PCP - General (Internal Medicine)  ASSESSMENT & PLAN:  Chronic ITP (idiopathic thrombocytopenia) ([place]) She tolerated prednisone taper well She felt that prednisone is causing her to go crazy and she disliked feeling depressed Her platelet count is stable She is aware about risk of ITP relapse We will discontinue prednisone today I will see her again in a month for further follow-up  Anemia, chronic disease She has multifactorial anemia, component of iron deficiency and anemia chronic illness She was evaluated by gastroenterologist who recommended against endoscopy evaluation I will defer to them for management  No orders of the defined types were placed in this encounter.   All questions were answered. The patient knows to call the clinic with any problems, questions or concerns. The total time spent in the appointment was 20 minutes encounter with patients including review of chart and various tests results, discussions about plan of care and coordination of care plan   Darlene Lark, MD 11/11/2021 2:42 PM  INTERVAL HISTORY: Please see below for problem oriented charting. she returns for treatment follow-up on low-dose prednisone for recurrent ITP She is here accompanied by her daughter She disliked taking prednisone and felt that it is making her depressed She was evaluated by gastroenterologist recently for iron deficiency anemia Her anemia has resolved when she takes oral iron supplement  REVIEW OF SYSTEMS:   Constitutional: Denies fevers, chills or abnormal weight loss Eyes: Denies blurriness of vision Ears, nose, mouth, throat, and face: Denies mucositis or sore throat Respiratory: Denies cough, dyspnea or wheezes Cardiovascular: Denies palpitation, chest discomfort or lower extremity swelling Gastrointestinal:  Denies nausea, heartburn or change in bowel habits Skin: Denies  abnormal skin rashes Lymphatics: Denies new lymphadenopathy or easy bruising Neurological:Denies numbness, tingling or new weaknesses Behavioral/Psych: Mood is stable, no new changes  All other systems were reviewed with the patient and are negative.  I have reviewed the past medical history, past surgical history, social history and family history with the patient and they are unchanged from previous note.  ALLERGIES:  is allergic to oxycodone, sulfamethoxazole-trimethoprim, codeine, doxycycline, meperidine and related, sulfa antibiotics, and sulfacetamide sodium.  MEDICATIONS:  Current Outpatient Medications  Medication Sig Dispense Refill   acetaminophen (TYLENOL) 500 MG tablet Take 1 tablet (500 mg total) by mouth every 6 (six) hours as needed for mild pain. 30 tablet 0   amoxicillin (AMOXIL) 500 MG capsule Take 2,000 mg by mouth once as needed (One hour prior to dental appointment).     aspirin 81 MG tablet Take 81 mg by mouth daily.     cholecalciferol (VITAMIN D) 1000 UNITS tablet Take 1,000 Units by mouth daily.     citalopram (CELEXA) 40 MG tablet Take 40 mg by mouth daily.      Coenzyme Q10 (CO Q-10 PO) Take 1 tablet by mouth daily.     Cyanocobalamin (VITAMIN B-12 PO) Take 1,000 mcg by mouth daily.     diazepam (VALIUM) 5 MG tablet Take 1 tablet (5 mg total) by mouth every 6 (six) hours as needed for anxiety (Take 30-min prior MRI). 1 tablet 0   diclofenac Sodium (VOLTAREN) 1 % GEL diclofenac 1 % topical gel     Difluprednate 0.05 % EMUL Place 2 drops into the right eye 2 (two) times daily.      ergocalciferol (VITAMIN D2) 1.25 MG (50000 UT) capsule      esomeprazole (NEXIUM) 40 MG  capsule Take 40 mg by mouth 2 (two) times daily before a meal.     famciclovir (FAMVIR) 125 MG tablet      Iron, Ferrous Sulfate, 325 (65 Fe) MG TABS Take 1 tablet by mouth daily.     L-Methylfolate-Algae-B12-B6 (METANX) 3-90.314-2-35 MG CAPS      levothyroxine (SYNTHROID, LEVOTHROID) 75 MCG tablet  Take 75 mcg by mouth daily before breakfast.      lovastatin (MEVACOR) 20 MG tablet Take 20 mg by mouth daily.      magnesium oxide (MAG-OX) 400 MG tablet Take 400 mg by mouth daily.     metoprolol succinate (TOPROL-XL) 25 MG 24 hr tablet Take 25 mg by mouth daily.      mirabegron ER (MYRBETRIQ) 25 MG TB24 tablet Myrbetriq 25 mg tablet,extended release     Multiple Vitamins-Minerals (MULTIVITAMINS THER. W/MINERALS) TABS Take 1 tablet by mouth daily.     nystatin ointment (MYCOSTATIN) nystatin 100,000 unit/gram topical ointment     nystatin-triamcinolone ointment (MYCOLOG) Apply 1 application topically daily as needed (itching).      Polyethyl Glycol-Propyl Glycol 0.4-0.3 % SOLN Apply 2 drops to eye 2 (two) times daily as needed (for dry eyes).     pregabalin (LYRICA) 50 MG capsule Take 1 capsule by mouth 2 (two) times daily.     Probiotic Product (PROBIOTIC PO) Take 1 tablet by mouth daily.     triamcinolone ointment (KENALOG) 0.1 % Apply 1 application topically 2 (two) times daily.     No current facility-administered medications for this visit.    SUMMARY OF ONCOLOGIC HISTORY:  Darlene [name] was transferred to my care after her prior physician has left.  I reviewed the patient's records extensive and collaborated the history with the patient. 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 She never required any systemic treatment. 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 This has also resolved on its own. She has never developed any additional adenopathy other than the single lymph node palpable in the left supraclavicular fossa.  She did develop a second primary stage I,  0.8 cm,  ER negative cancer of the right breast, treated with lumpectomy, radiation and six cycles  of CMF chemotherapy in 1994. No evidence of a new disease from the breast cancer either. Most recent mammogram done in June 2015 showed stable fibroglandular changes in her breasts. She was called back for an ultrasound of the left breast which also came back benign. She had a CT scan of the abdomen done on 11/14/15 because of significant intentional weight loss and abdominal discomfort.  Imaging studies show no evidence of lymphoma.  Moderate stool burden was noted. Repeat CT scan in March 2019 show stable lymphadenopathy with no signs or symptoms of cancer recurrence On 01/26/2018, she underwent transurethral bladder resection of the tumor by urologist Between 2019-2023, she had recurrent ITP relapse On November 11, 2021, she has made informed decision to discontinue prednisone  PHYSICAL EXAMINATION: ECOG PERFORMANCE STATUS: 1 - Symptomatic but completely ambulatory  Vitals:   11/11/21 1106  BP: (!) 116/58  Pulse: 77  Resp: 18  Temp: (!) 97.4 F (36.3 C)  SpO2: 97%   Filed Weights   11/11/21 1106  Weight: 173 lb 3.2 oz (78.6 kg)    GENERAL:alert, no distress and comfortable NEURO: alert &  oriented x 3 with fluent speech, no focal motor/sensory deficits  LABORATORY DATA:  I have reviewed the data as listed    Component Value Date/Time   NA 137 10/01/2021 1320   NA 135 (L) 07/07/2016 1035   K 3.9 10/01/2021 1320   K 4.8 07/07/2016 1035   CL 104 10/01/2021 1320   CL 106 07/11/2012 0906   CO2 27 10/01/2021 1320   CO2 24 07/07/2016 1035   GLUCOSE 110 (H) 10/01/2021 1320   GLUCOSE 85 07/07/2016 1035   GLUCOSE 107 (H) 07/11/2012 0906   BUN 15 10/01/2021 1320   BUN 19.9 07/07/2016 1035   CREATININE 0.70 10/01/2021 1320   CREATININE 0.7 07/07/2016 1035   CALCIUM 10.4 (H) 10/01/2021 1320   CALCIUM 10.2 07/07/2016 1035   PROT 6.9 10/01/2021 1320   PROT 6.6 07/07/2016 1035   ALBUMIN 4.3 10/01/2021 1320   ALBUMIN 3.9 07/07/2016 1035   AST 17 10/01/2021 1320   AST 14 07/07/2016  1035   ALT 17 10/01/2021 1320   ALT 17 07/07/2016 1035   ALKPHOS 46 10/01/2021 1320   ALKPHOS 58 07/07/2016 1035   BILITOT 1.4 (H) 10/01/2021 1320   BILITOT 0.61 07/07/2016 1035   GFRNONAA >60 10/01/2021 1320   GFRAA >60 05/19/2018 1551    No results found for: "SPEP", "UPEP"  Lab Results  Component Value Date   WBC 10.6 (H) 11/11/2021   NEUTROABS 8.4 (H) 11/11/2021   HGB 12.5 11/11/2021   HCT 38.4 11/11/2021   MCV 87.1 11/11/2021   PLT 144 (L) 11/11/2021      Chemistry      Component Value Date/Time   NA 137 10/01/2021 1320   NA 135 (L) 07/07/2016 1035   K 3.9 10/01/2021 1320   K 4.8 07/07/2016 1035   CL 104 10/01/2021 1320   CL 106 07/11/2012 0906   CO2 27 10/01/2021 1320   CO2 24 07/07/2016 1035   BUN 15 10/01/2021 1320   BUN 19.9 07/07/2016 1035   CREATININE 0.70 10/01/2021 1320   CREATININE 0.7 07/07/2016 1035      Component Value Date/Time   CALCIUM 10.4 (H) 10/01/2021 1320   CALCIUM 10.2 07/07/2016 1035   ALKPHOS 46 10/01/2021 1320   ALKPHOS 58 07/07/2016 1035   AST 17 10/01/2021 1320   AST 14 07/07/2016 1035   ALT 17 10/01/2021 1320   ALT 17 07/07/2016 1035   BILITOT 1.4 (H) 10/01/2021 1320   BILITOT 0.61 07/07/2016 1035

## 2021-11-11 NOTE — Assessment & Plan Note (Signed)
She tolerated prednisone taper well She felt that prednisone is causing her to go crazy and she disliked feeling depressed Her platelet count is stable She is aware about risk of ITP relapse We will discontinue prednisone today I will see her again in a month for further follow-up

## 2021-11-11 NOTE — Assessment & Plan Note (Signed)
She has multifactorial anemia, component of iron deficiency and anemia chronic illness She was evaluated by gastroenterologist who recommended against endoscopy evaluation I will defer to them for management

## 2021-11-12 ENCOUNTER — Ambulatory Visit: Payer: Medicare Other | Admitting: Internal Medicine

## 2021-11-16 DIAGNOSIS — S0003XD Contusion of scalp, subsequent encounter: Secondary | ICD-10-CM | POA: Diagnosis not present

## 2021-11-16 DIAGNOSIS — Z7982 Long term (current) use of aspirin: Secondary | ICD-10-CM | POA: Diagnosis not present

## 2021-11-16 DIAGNOSIS — Z9181 History of falling: Secondary | ICD-10-CM | POA: Diagnosis not present

## 2021-11-16 DIAGNOSIS — F17210 Nicotine dependence, cigarettes, uncomplicated: Secondary | ICD-10-CM | POA: Diagnosis not present

## 2021-11-16 DIAGNOSIS — Z853 Personal history of malignant neoplasm of breast: Secondary | ICD-10-CM | POA: Diagnosis not present

## 2021-11-16 DIAGNOSIS — I7090 Unspecified atherosclerosis: Secondary | ICD-10-CM | POA: Diagnosis not present

## 2021-11-16 DIAGNOSIS — I4510 Unspecified right bundle-branch block: Secondary | ICD-10-CM | POA: Diagnosis not present

## 2021-11-16 DIAGNOSIS — M810 Age-related osteoporosis without current pathological fracture: Secondary | ICD-10-CM | POA: Diagnosis not present

## 2021-11-16 DIAGNOSIS — K219 Gastro-esophageal reflux disease without esophagitis: Secondary | ICD-10-CM | POA: Diagnosis not present

## 2021-11-16 DIAGNOSIS — E039 Hypothyroidism, unspecified: Secondary | ICD-10-CM | POA: Diagnosis not present

## 2021-11-16 DIAGNOSIS — Z7952 Long term (current) use of systemic steroids: Secondary | ICD-10-CM | POA: Diagnosis not present

## 2021-11-16 DIAGNOSIS — D696 Thrombocytopenia, unspecified: Secondary | ICD-10-CM | POA: Diagnosis not present

## 2021-11-16 DIAGNOSIS — G500 Trigeminal neuralgia: Secondary | ICD-10-CM | POA: Diagnosis not present

## 2021-11-16 DIAGNOSIS — D649 Anemia, unspecified: Secondary | ICD-10-CM | POA: Diagnosis not present

## 2021-11-16 DIAGNOSIS — E785 Hyperlipidemia, unspecified: Secondary | ICD-10-CM | POA: Diagnosis not present

## 2021-11-16 DIAGNOSIS — S060X0S Concussion without loss of consciousness, sequela: Secondary | ICD-10-CM | POA: Diagnosis not present

## 2021-11-16 DIAGNOSIS — M4800 Spinal stenosis, site unspecified: Secondary | ICD-10-CM | POA: Diagnosis not present

## 2021-11-16 DIAGNOSIS — I10 Essential (primary) hypertension: Secondary | ICD-10-CM | POA: Diagnosis not present

## 2021-11-16 DIAGNOSIS — J449 Chronic obstructive pulmonary disease, unspecified: Secondary | ICD-10-CM | POA: Diagnosis not present

## 2021-11-16 DIAGNOSIS — G629 Polyneuropathy, unspecified: Secondary | ICD-10-CM | POA: Diagnosis not present

## 2021-11-16 DIAGNOSIS — F339 Major depressive disorder, recurrent, unspecified: Secondary | ICD-10-CM | POA: Diagnosis not present

## 2021-11-24 DIAGNOSIS — E785 Hyperlipidemia, unspecified: Secondary | ICD-10-CM | POA: Diagnosis not present

## 2021-11-24 DIAGNOSIS — S060X0S Concussion without loss of consciousness, sequela: Secondary | ICD-10-CM | POA: Diagnosis not present

## 2021-11-24 DIAGNOSIS — S0003XD Contusion of scalp, subsequent encounter: Secondary | ICD-10-CM | POA: Diagnosis not present

## 2021-11-24 DIAGNOSIS — I7090 Unspecified atherosclerosis: Secondary | ICD-10-CM | POA: Diagnosis not present

## 2021-11-25 ENCOUNTER — Ambulatory Visit (INDEPENDENT_AMBULATORY_CARE_PROVIDER_SITE_OTHER): Payer: Medicare Other | Admitting: Podiatry

## 2021-11-25 ENCOUNTER — Encounter: Payer: Self-pay | Admitting: Podiatry

## 2021-11-25 DIAGNOSIS — M79676 Pain in unspecified toe(s): Secondary | ICD-10-CM

## 2021-11-25 DIAGNOSIS — B351 Tinea unguium: Secondary | ICD-10-CM

## 2021-11-25 DIAGNOSIS — D696 Thrombocytopenia, unspecified: Secondary | ICD-10-CM

## 2021-11-25 NOTE — Progress Notes (Signed)
This patient returns to the office for evaluation and treatment of long thick painful nails .  This patient is unable to trim her own nails since the patient cannot reach her feet.  Patient says the nails are painful walking and wearing his shoes.  She returns for preventive foot care services.  General Appearance  Alert, conversant and in no acute stress.  Vascular  Dorsalis pedis and posterior tibial  pulses are palpable  bilaterally.  Capillary return is within normal limits  bilaterally. Temperature is within normal limits  bilaterally.  Neurologic  Senn-Weinstein monofilament wire test within normal limits  bilaterally. Muscle power within normal limits bilaterally.  Nails Thick disfigured discolored nails with subungual debris  from hallux to fifth toes bilaterally. No evidence of bacterial infection or drainage bilaterally.  Orthopedic  No limitations of motion  feet .  No crepitus or effusions noted.  No bony pathology or digital deformities noted.  Hallux varus 1st MPJ and hallux malleus right foot.   Hammer toes 2,3 right.  Skin  normotropic skin with no porokeratosis noted bilaterally.  No signs of infections or ulcers noted.     Onychomycosis  Pain in toes right foot  Pain in toes left foot  Debridement  of nails  1-5  B/L with a nail nipper.  Nails were then filed using a dremel tool with no incidents.    RTC 3 months    Gardiner Barefoot DPM

## 2021-11-28 DIAGNOSIS — S060X0S Concussion without loss of consciousness, sequela: Secondary | ICD-10-CM | POA: Diagnosis not present

## 2021-11-28 DIAGNOSIS — E785 Hyperlipidemia, unspecified: Secondary | ICD-10-CM | POA: Diagnosis not present

## 2021-11-28 DIAGNOSIS — F17210 Nicotine dependence, cigarettes, uncomplicated: Secondary | ICD-10-CM | POA: Diagnosis not present

## 2021-11-28 DIAGNOSIS — I7090 Unspecified atherosclerosis: Secondary | ICD-10-CM | POA: Diagnosis not present

## 2021-11-28 DIAGNOSIS — S0003XD Contusion of scalp, subsequent encounter: Secondary | ICD-10-CM | POA: Diagnosis not present

## 2021-11-28 DIAGNOSIS — E039 Hypothyroidism, unspecified: Secondary | ICD-10-CM | POA: Diagnosis not present

## 2021-12-01 ENCOUNTER — Ambulatory Visit
Admission: RE | Admit: 2021-12-01 | Discharge: 2021-12-01 | Disposition: A | Discharge status: Home / Self Care | Payer: Medicare Other | Source: Ambulatory Visit | Attending: Registered Nurse | Admitting: Registered Nurse

## 2021-12-01 DIAGNOSIS — Z1231 Encounter for screening mammogram for malignant neoplasm of breast: Secondary | ICD-10-CM

## 2021-12-05 DIAGNOSIS — I7090 Unspecified atherosclerosis: Secondary | ICD-10-CM | POA: Diagnosis not present

## 2021-12-05 DIAGNOSIS — S0003XD Contusion of scalp, subsequent encounter: Secondary | ICD-10-CM | POA: Diagnosis not present

## 2021-12-05 DIAGNOSIS — E785 Hyperlipidemia, unspecified: Secondary | ICD-10-CM | POA: Diagnosis not present

## 2021-12-05 DIAGNOSIS — F17210 Nicotine dependence, cigarettes, uncomplicated: Secondary | ICD-10-CM | POA: Diagnosis not present

## 2021-12-05 DIAGNOSIS — E039 Hypothyroidism, unspecified: Secondary | ICD-10-CM | POA: Diagnosis not present

## 2021-12-05 DIAGNOSIS — S060X0S Concussion without loss of consciousness, sequela: Secondary | ICD-10-CM | POA: Diagnosis not present

## 2021-12-10 DIAGNOSIS — E039 Hypothyroidism, unspecified: Secondary | ICD-10-CM | POA: Diagnosis not present

## 2021-12-10 DIAGNOSIS — E785 Hyperlipidemia, unspecified: Secondary | ICD-10-CM | POA: Diagnosis not present

## 2021-12-10 DIAGNOSIS — I7090 Unspecified atherosclerosis: Secondary | ICD-10-CM | POA: Diagnosis not present

## 2021-12-10 DIAGNOSIS — F17210 Nicotine dependence, cigarettes, uncomplicated: Secondary | ICD-10-CM | POA: Diagnosis not present

## 2021-12-10 DIAGNOSIS — S060X0S Concussion without loss of consciousness, sequela: Secondary | ICD-10-CM | POA: Diagnosis not present

## 2021-12-10 DIAGNOSIS — S0003XD Contusion of scalp, subsequent encounter: Secondary | ICD-10-CM | POA: Diagnosis not present

## 2021-12-15 ENCOUNTER — Inpatient Hospital Stay: Payer: Medicare Other | Attending: Hematology and Oncology | Admitting: Hematology and Oncology

## 2021-12-15 ENCOUNTER — Encounter: Payer: Self-pay | Admitting: Hematology and Oncology

## 2021-12-15 ENCOUNTER — Inpatient Hospital Stay: Payer: Medicare Other

## 2021-12-15 ENCOUNTER — Other Ambulatory Visit: Payer: Self-pay

## 2021-12-15 DIAGNOSIS — Z79890 Hormone replacement therapy (postmenopausal): Secondary | ICD-10-CM | POA: Insufficient documentation

## 2021-12-15 DIAGNOSIS — Z79899 Other long term (current) drug therapy: Secondary | ICD-10-CM | POA: Diagnosis not present

## 2021-12-15 DIAGNOSIS — Z885 Allergy status to narcotic agent status: Secondary | ICD-10-CM | POA: Insufficient documentation

## 2021-12-15 DIAGNOSIS — Z853 Personal history of malignant neoplasm of breast: Secondary | ICD-10-CM | POA: Diagnosis not present

## 2021-12-15 DIAGNOSIS — Z881 Allergy status to other antibiotic agents status: Secondary | ICD-10-CM | POA: Diagnosis not present

## 2021-12-15 DIAGNOSIS — D693 Immune thrombocytopenic purpura: Secondary | ICD-10-CM | POA: Diagnosis not present

## 2021-12-15 DIAGNOSIS — Z882 Allergy status to sulfonamides status: Secondary | ICD-10-CM | POA: Insufficient documentation

## 2021-12-15 LAB — CBC WITH DIFFERENTIAL/PLATELET
Abs Immature Granulocytes: 0.02 10*3/uL (ref 0.00–0.07)
Basophils Absolute: 0.1 10*3/uL (ref 0.0–0.1)
Basophils Relative: 1 %
Eosinophils Absolute: 0.1 10*3/uL (ref 0.0–0.5)
Eosinophils Relative: 1 %
HCT: 37.7 % (ref 36.0–46.0)
Hemoglobin: 12.6 g/dL (ref 12.0–15.0)
Immature Granulocytes: 0 %
Lymphocytes Relative: 19 %
Lymphs Abs: 1.4 10*3/uL (ref 0.7–4.0)
MCH: 28.9 pg (ref 26.0–34.0)
MCHC: 33.4 g/dL (ref 30.0–36.0)
MCV: 86.5 fL (ref 80.0–100.0)
Monocytes Absolute: 0.5 10*3/uL (ref 0.1–1.0)
Monocytes Relative: 7 %
Neutro Abs: 5 10*3/uL (ref 1.7–7.7)
Neutrophils Relative %: 72 %
Platelets: 106 10*3/uL — ABNORMAL LOW (ref 150–400)
RBC: 4.36 MIL/uL (ref 3.87–5.11)
RDW: 13.9 % (ref 11.5–15.5)
WBC: 7.1 10*3/uL (ref 4.0–10.5)
nRBC: 0 % (ref 0.0–0.2)

## 2021-12-15 NOTE — Assessment & Plan Note (Signed)
She has slight worsening thrombocytopenia since discontinuation of prednisone The patient is quite adamant that she will never go back on prednisone because it made her feel bad For now, there is no indication for treatment I plan to resume treatment once her platelet count drift closer to 50,000 Previously, her insurance has declined authorizing Promacta We will observe her closely I plan to see her back within the month for next follow-up

## 2021-12-15 NOTE — Progress Notes (Signed)
[facility] OFFICE PROGRESS NOTE  Holland Commons, FNP  ASSESSMENT & PLAN:  Chronic ITP (idiopathic thrombocytopenia) ([facility]) She has slight worsening thrombocytopenia since discontinuation of prednisone The patient is quite adamant that she will never go back on prednisone because it made her feel bad For now, there is no indication for treatment I plan to resume treatment once her platelet count drift closer to 50,000 Previously, her insurance has declined authorizing Promacta We will observe her closely I plan to see her back within the month for next follow-up  No orders of the defined types were placed in this encounter.   The total time spent in the appointment was 20 minutes encounter with patients including review of chart and various tests results, discussions about plan of care and coordination of care plan   All questions were answered. The patient knows to call the clinic with any problems, questions or concerns. No barriers to learning was detected.    Heath Lark, MD 8/14/20232:21 PM  INTERVAL HISTORY: Darlene [name] 87 y.o. female returns for further follow-up and management of ITP She felt better since discontinuation of prednisone She ruminates over side effects from prednisone and reminded me that she does not want to go back on prednisone in the future The patient denies any recent signs or symptoms of bleeding such as spontaneous epistaxis, hematuria or hematochezia.   SUMMARY OF HEMATOLOGIC HISTORY:  SKYLEN DANIELSEN was transferred to my care after her prior physician has left.  I reviewed the patient's records extensive and collaborated the history with the patient. 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 She never required any systemic treatment. 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 This has also resolved on its own. She has never developed any additional adenopathy other than the single lymph node palpable in the left supraclavicular fossa.  She did develop a second primary stage I,  0.8 cm,  ER negative cancer of the right breast, treated with lumpectomy, radiation and six cycles of CMF chemotherapy in 1994. No evidence of a new disease from the breast cancer either. Most recent mammogram done in June 2015 showed stable fibroglandular changes in her breasts. She was called back for an ultrasound of the left breast which also came back benign. She had a CT scan of the abdomen done on 11/14/15 because of significant intentional weight loss and abdominal discomfort.  Imaging studies show no evidence of lymphoma.  Moderate stool burden was noted. Repeat CT scan in March 2019 show stable lymphadenopathy with no signs or symptoms of cancer recurrence On 01/26/2018, she underwent transurethral bladder resection of the tumor by urologist Between 2019-2023, she had recurrent ITP relapse On November 11, 2021, she has made informed decision to discontinue prednisone   I have reviewed the past medical history, past surgical history, social history and family history with the patient and they are unchanged from previous note.  ALLERGIES:  is allergic to oxycodone, oxycodone hcl, sulfamethoxazole-trimethoprim, codeine, doxycycline, meperidine and related, sulfa antibiotics, and sulfacetamide sodium.  MEDICATIONS:  Current Outpatient Medications  Medication Sig Dispense Refill   acetaminophen (TYLENOL) 500 MG tablet Take 1 tablet (500 mg total) by mouth every 6 (six) hours as needed for mild pain. 30 tablet 0   amoxicillin (AMOXIL) 500 MG capsule Take 2,000  mg by mouth once as needed (One hour prior to dental appointment).     aspirin 81 MG tablet Take 81 mg by mouth daily.     betamethasone dipropionate 4.16 %  cream 1 application     cholecalciferol (VITAMIN D) 1000 UNITS tablet Take 1,000 Units by mouth daily.     citalopram (CELEXA) 40 MG tablet Take 40 mg by mouth daily.      Clobetasol Propionate 0.05 % shampoo      Coenzyme Q10 (CO Q 10) 100 MG CAPS See admin instructions.     Coenzyme Q10 (CO Q-10 PO) Take 1 tablet by mouth daily.     Cyanocobalamin (VITAMIN B-12 PO) Take 1,000 mcg by mouth daily.     diazepam (VALIUM) 5 MG tablet Take 1 tablet (5 mg total) by mouth every 6 (six) hours as needed for anxiety (Take 30-min prior MRI). 1 tablet 0   diclofenac Sodium (VOLTAREN) 1 % GEL diclofenac 1 % topical gel     Difluprednate 0.05 % EMUL Place 2 drops into the right eye 2 (two) times daily.      ergocalciferol (VITAMIN D2) 1.25 MG (50000 UT) capsule      esomeprazole (NEXIUM) 40 MG capsule Take 40 mg by mouth 2 (two) times daily before a meal.     famciclovir (FAMVIR) 125 MG tablet      fluocinonide (LIDEX) 0.05 % external solution      Iron, Ferrous Sulfate, 325 (65 Fe) MG TABS Take 1 tablet by mouth daily.     L-Methylfolate-Algae-B12-B6 (METANX) 3-90.314-2-35 MG CAPS      levothyroxine (SYNTHROID, LEVOTHROID) 75 MCG tablet Take 75 mcg by mouth daily before breakfast.      lovastatin (MEVACOR) 20 MG tablet Take 20 mg by mouth daily.      lovastatin (MEVACOR) 20 MG tablet TAKE 1 TABLET BY MOUTH ONCEDAILY AT BEDTIME     magnesium oxide (MAG-OX) 400 MG tablet Take 400 mg by mouth daily.     metoprolol succinate (TOPROL-XL) 25 MG 24 hr tablet Take 25 mg by mouth daily.      mirabegron ER (MYRBETRIQ) 25 MG TB24 tablet Myrbetriq 25 mg tablet,extended release     Multiple Vitamins-Minerals (MULTIVITAMINS THER. W/MINERALS) TABS Take 1 tablet by mouth daily.     nystatin ointment (MYCOSTATIN) nystatin 100,000 unit/gram topical ointment     nystatin-triamcinolone ointment (MYCOLOG) Apply 1 application topically daily as needed (itching).      Polyethyl Glycol-Propyl Glycol 0.4-0.3 % SOLN Apply 2  drops to eye 2 (two) times daily as needed (for dry eyes).     pregabalin (LYRICA) 50 MG capsule Take 1 capsule by mouth 2 (two) times daily.     Probiotic Product (PROBIOTIC PO) Take 1 tablet by mouth daily.     traMADol (ULTRAM) 50 MG tablet      triamcinolone (KENALOG) 0.025 % ointment      triamcinolone ointment (KENALOG) 0.1 % Apply 1 application topically 2 (two) times daily.     No current facility-administered medications for this visit.     REVIEW OF SYSTEMS:   Constitutional: Denies fevers, chills or night sweats Eyes: Denies blurriness of vision Ears, nose, mouth, throat, and face: Denies mucositis or sore throat Respiratory: Denies cough, dyspnea or wheezes Cardiovascular: Denies palpitation, chest discomfort or lower extremity swelling Gastrointestinal:  Denies nausea, heartburn or change in bowel habits Skin: Denies abnormal skin rashes Lymphatics: Denies new lymphadenopathy or easy bruising Neurological:Denies numbness, tingling or new weaknesses Behavioral/Psych:  Mood is stable, no new changes  All other systems were reviewed with the patient and are negative.  PHYSICAL EXAMINATION: ECOG PERFORMANCE STATUS: 0 - Asymptomatic  Vitals:   12/15/21 1149  BP: 114/64  Pulse: 74  Resp: 18  SpO2: 96%   Filed Weights   12/15/21 1149  Weight: 168 lb 12.8 oz (76.6 kg)    GENERAL:alert, no distress and comfortable NEURO: alert & oriented x 3 with fluent speech, no focal motor/sensory deficits  LABORATORY DATA:  I have reviewed the data as listed     Component Value Date/Time   NA 137 10/01/2021 1320   NA 135 (L) 07/07/2016 1035   K 3.9 10/01/2021 1320   K 4.8 07/07/2016 1035   CL 104 10/01/2021 1320   CL 106 07/11/2012 0906   CO2 27 10/01/2021 1320   CO2 24 07/07/2016 1035   GLUCOSE 110 (H) 10/01/2021 1320   GLUCOSE 85 07/07/2016 1035   GLUCOSE 107 (H) 07/11/2012 0906   BUN 15 10/01/2021 1320   BUN 19.9 07/07/2016 1035   CREATININE 0.70 10/01/2021 1320    CREATININE 0.7 07/07/2016 1035   CALCIUM 10.4 (H) 10/01/2021 1320   CALCIUM 10.2 07/07/2016 1035   PROT 6.9 10/01/2021 1320   PROT 6.6 07/07/2016 1035   ALBUMIN 4.3 10/01/2021 1320   ALBUMIN 3.9 07/07/2016 1035   AST 17 10/01/2021 1320   AST 14 07/07/2016 1035   ALT 17 10/01/2021 1320   ALT 17 07/07/2016 1035   ALKPHOS 46 10/01/2021 1320   ALKPHOS 58 07/07/2016 1035   BILITOT 1.4 (H) 10/01/2021 1320   BILITOT 0.61 07/07/2016 1035   GFRNONAA >60 10/01/2021 1320   GFRAA >60 05/19/2018 1551    No results found for: "SPEP", "UPEP"  Lab Results  Component Value Date   WBC 7.1 12/15/2021   NEUTROABS 5.0 12/15/2021   HGB 12.6 12/15/2021   HCT 37.7 12/15/2021   MCV 86.5 12/15/2021   PLT 106 (L) 12/15/2021      Chemistry      Component Value Date/Time   NA 137 10/01/2021 1320   NA 135 (L) 07/07/2016 1035   K 3.9 10/01/2021 1320   K 4.8 07/07/2016 1035   CL 104 10/01/2021 1320   CL 106 07/11/2012 0906   CO2 27 10/01/2021 1320   CO2 24 07/07/2016 1035   BUN 15 10/01/2021 1320   BUN 19.9 07/07/2016 1035   CREATININE 0.70 10/01/2021 1320   CREATININE 0.7 07/07/2016 1035      Component Value Date/Time   CALCIUM 10.4 (H) 10/01/2021 1320   CALCIUM 10.2 07/07/2016 1035   ALKPHOS 46 10/01/2021 1320   ALKPHOS 58 07/07/2016 1035   AST 17 10/01/2021 1320   AST 14 07/07/2016 1035   ALT 17 10/01/2021 1320   ALT 17 07/07/2016 1035   BILITOT 1.4 (H) 10/01/2021 1320   BILITOT 0.61 07/07/2016 1035

## 2021-12-22 DIAGNOSIS — F339 Major depressive disorder, recurrent, unspecified: Secondary | ICD-10-CM | POA: Diagnosis not present

## 2021-12-22 DIAGNOSIS — E039 Hypothyroidism, unspecified: Secondary | ICD-10-CM | POA: Diagnosis not present

## 2021-12-22 DIAGNOSIS — E559 Vitamin D deficiency, unspecified: Secondary | ICD-10-CM | POA: Diagnosis not present

## 2021-12-22 DIAGNOSIS — Z0000 Encounter for general adult medical examination without abnormal findings: Secondary | ICD-10-CM | POA: Diagnosis not present

## 2021-12-22 DIAGNOSIS — K219 Gastro-esophageal reflux disease without esophagitis: Secondary | ICD-10-CM | POA: Diagnosis not present

## 2021-12-22 DIAGNOSIS — G629 Polyneuropathy, unspecified: Secondary | ICD-10-CM | POA: Diagnosis not present

## 2021-12-22 DIAGNOSIS — I700 Atherosclerosis of aorta: Secondary | ICD-10-CM | POA: Diagnosis not present

## 2021-12-22 DIAGNOSIS — I10 Essential (primary) hypertension: Secondary | ICD-10-CM | POA: Diagnosis not present

## 2021-12-22 DIAGNOSIS — E785 Hyperlipidemia, unspecified: Secondary | ICD-10-CM | POA: Diagnosis not present

## 2021-12-22 DIAGNOSIS — M810 Age-related osteoporosis without current pathological fracture: Secondary | ICD-10-CM | POA: Diagnosis not present

## 2021-12-22 DIAGNOSIS — D696 Thrombocytopenia, unspecified: Secondary | ICD-10-CM | POA: Diagnosis not present

## 2021-12-29 ENCOUNTER — Ambulatory Visit (INDEPENDENT_AMBULATORY_CARE_PROVIDER_SITE_OTHER): Payer: Medicare Other | Admitting: Neurology

## 2021-12-29 ENCOUNTER — Telehealth: Payer: Self-pay | Admitting: Neurology

## 2021-12-29 ENCOUNTER — Encounter: Payer: Self-pay | Admitting: Neurology

## 2021-12-29 VITALS — BP 132/73 | HR 63 | Ht 60.0 in | Wt 172.0 lb

## 2021-12-29 DIAGNOSIS — R269 Unspecified abnormalities of gait and mobility: Secondary | ICD-10-CM | POA: Diagnosis not present

## 2021-12-29 DIAGNOSIS — R4189 Other symptoms and signs involving cognitive functions and awareness: Secondary | ICD-10-CM

## 2021-12-29 DIAGNOSIS — R479 Unspecified speech disturbances: Secondary | ICD-10-CM

## 2021-12-29 NOTE — Progress Notes (Addendum)
Chief Complaint  Patient presents with   New Patient (Initial Visit)    Rm with daughter last visit in 2014/07/04 here for consult on worsening balance and frequent falls. Pt reports sx have been on going for a few months now. One fall resulted in a concussion ( went to the ER on 10/04/2021). She does use a walker at home and when she is out of the home        [place] is a 87 y.o. female Dementia  MoCA examination 18/30  CT head of the brain showed moderate atrophy, small vessel disease Gait abnormality  Multifactorial, aging, deconditioning, obesity, right foot deformity, significant right metatarsal pain, history of compression fracture of T9, with mild thoracic canal stenosis, last MRI was in September 2021,  Multilevel lumbar degenerative changes, with moderate L3-4 stenosis, multilevel severe bilateral L3-4, left L5-S1, foraminal canal stenosis  Discussed with patient and her daughter, both want complete evaluation for them to better understanding of her progressive memory loss, gait abnormalities, worsening low back pain,  Will proceed with MRI of the brain, thoracic spine with without contrast, history of breast cancer  Laboratory evaluations  Refer home home health  Return to clinic with nurse practitioner in 6 months  DIAGNOSTIC DATA (LABS, IMAGING, TESTING) - I reviewed patient records, labs, notes, testing and imaging myself where available.   MEDICAL HISTORY:  ENEDELIA MARTORELLI is a 87 year old female, accompanied by her daughter, seen in request by her primary care nurse practitioner Holland Commons, for evaluation of memory loss, gait abnormality, frequent fall,  I reviewed and summarized the referring note.PMHX. HLD Hypothyrodism HTN Bladder cancer,  Breast cancer, s/p right lobectomy chemo-radiation therapy Non-Hodgkin's Lymphoma   Urinary urgency. Right wrist,  Right knee replacement  Patient lives alone at home since her  husband passed away in 04-Jul-2013, she lives with her dog, still walk her dog half mile each day, but she has gradually declined functional status, gait abnormality, dependent of her walker since July 04, 2018  She suffered few fall accident, most recent fall was in June 2023, was treated at emergency room  I personally reviewed CT head without contrast no acute abnormality, moderate atrophy small vessel disease  Laboratory evaluation showed  CBC hemoglobin of 12.6, with mildly decreased platelet 106, CMP calcium was mildly elevated 10.4, normal kidney function creatinine 0.7, normal ferritin, B12, ESR,  She retired from Therapist, art, noticed gradual onset of memory loss over the past few years, still drives short distance, but needing her neighbor to help writing her check over the past few years, daughter also noticed that she make mistake on her financial affairs sometimes, MoCA examination is only 19 out of 30 today,  She has significant right foot pain, right foot deformity, had right toe surgery in the past, especially since her recent 4 in June, she complains of significant right foot pain while bearing weight,  She also had a history of chronic low back pain, personally reviewed MRI of lumbar spine in August 2021, multilevel degenerative changes, moderate canal stenosis at L3-4, mild L2-3, multilevel foraminal stenosis, severe bilaterally L3-4, left L5-S1  MRI of thoracic spine September 2021, T9 vertebral compression fracture, retropulsion, mild canal stenosis   PHYSICAL EXAM:   Vitals:   12/29/21 1414  BP: 132/73  Pulse: 63  Weight: 172 lb (78 kg)  Height: 5' (1.524 m)   Not recorded     Body mass index is 33.59 kg/m.  PHYSICAL  EXAMNIATION:  Gen: NAD, conversant, well nourised, well groomed                     Cardiovascular: Regular rate rhythm, no peripheral edema, warm, nontender. Eyes: Conjunctivae clear without exudates or hemorrhage Neck: Supple, no carotid  bruits. Pulmonary: Clear to auscultation bilaterally   NEUROLOGICAL EXAM:  MENTAL STATUS: Speech/cognition: Awake, alert, oriented to history taking and casual conversation    12/29/2021    3:00 PM  Montreal Cognitive Assessment   Visuospatial/ Executive (0/5) 2  Naming (0/3) 3  Attention: Read list of digits (0/2) 2  Attention: Read list of letters (0/1) 1  Attention: Serial 7 subtraction starting at 100 (0/3) 0  Language: Repeat phrase (0/2) 2  Language : Fluency (0/1) 0  Abstraction (0/2) 2  Delayed Recall (0/5) 0  Orientation (0/6) 6  Total 18    CRANIAL NERVES: CN II: Visual fields are full to confrontation. Pupils are round equal and briskly reactive to light. CN III, IV, VI: extraocular movement are normal. No ptosis. CN V: Facial sensation is intact to light touch CN VII: Face is symmetric with normal eye closure  CN VIII: Hearing is normal to causal conversation. CN IX, X: Phonation is normal. CN XI: Head turning and shoulder shrug are intact  MOTOR: She has significant deformity of right foot, right toes, significant tenderness upon deep palpitation of right toes, especially metatarsal joints,  No significant upper or lower extremity muscle weakness, lower extremity pitting edema to mid shin level  REFLEXES: Reflexes are 1 and symmetric at the biceps, triceps, knees, absent at ankles. Plantar responses are flexor.  SENSORY: Decreased light touch, pinprick, vibratory sensation to ankle level,  COORDINATION: There is no trunk or limb dysmetria noted.  GAIT/STANCE: Need push-up to get up from sitting position, rely on her walker, unsteady dragging right leg more  REVIEW OF SYSTEMS:  Full 14 system review of systems performed and notable only for as above All other review of systems were negative.   ALLERGIES: Allergies  Allergen Reactions   Oxycodone Other (See Comments)    Made her depressed   Oxycodone Hcl     Other reaction(s): depressed feeling    Sulfamethoxazole-Trimethoprim     Other reaction(s): Unknown   Codeine Itching            Doxycycline Swelling and Other (See Comments)    Burns throat also   Meperidine And Related Itching and Rash    Makes cheeks red also   Sulfa Antibiotics Itching   Sulfacetamide Sodium Itching    HOME MEDICATIONS: Current Outpatient Medications  Medication Sig Dispense Refill   acetaminophen (TYLENOL) 500 MG tablet Take 1 tablet (500 mg total) by mouth every 6 (six) hours as needed for mild pain. 30 tablet 0   amoxicillin (AMOXIL) 500 MG capsule Take 2,000 mg by mouth once as needed (One hour prior to dental appointment).     aspirin 81 MG tablet Take 81 mg by mouth daily.     cholecalciferol (VITAMIN D) 1000 UNITS tablet Take 1,000 Units by mouth daily.     citalopram (CELEXA) 40 MG tablet Take 40 mg by mouth daily.      Clobetasol Propionate 0.05 % shampoo      Coenzyme Q10 (CO Q 10) 100 MG CAPS See admin instructions.     Cyanocobalamin (VITAMIN B-12 PO) Take 1,000 mcg by mouth daily.     levothyroxine (SYNTHROID, LEVOTHROID) 75 MCG tablet Take 75 mcg  by mouth daily before breakfast.      magnesium oxide (MAG-OX) 400 MG tablet Take 400 mg by mouth daily.     metoprolol succinate (TOPROL-XL) 25 MG 24 hr tablet Take 25 mg by mouth daily.      mirabegron ER (MYRBETRIQ) 25 MG TB24 tablet Myrbetriq 25 mg tablet,extended release     Multiple Vitamins-Minerals (MULTIVITAMINS THER. W/MINERALS) TABS Take 1 tablet by mouth daily.     Polyethyl Glycol-Propyl Glycol 0.4-0.3 % SOLN Apply 2 drops to eye 2 (two) times daily as needed (for dry eyes).     Probiotic Product (PROBIOTIC PO) Take 1 tablet by mouth daily.     betamethasone dipropionate 0.03 % cream 1 application (Patient not taking: Reported on 12/29/2021)     diclofenac Sodium (VOLTAREN) 1 % GEL diclofenac 1 % topical gel     ergocalciferol (VITAMIN D2) 1.25 MG (50000 UT) capsule      esomeprazole (NEXIUM) 40 MG capsule Take 40 mg by mouth 2  (two) times daily before a meal.     famciclovir (FAMVIR) 125 MG tablet      Iron, Ferrous Sulfate, 325 (65 Fe) MG TABS Take 1 tablet by mouth daily.     lovastatin (MEVACOR) 20 MG tablet TAKE 1 TABLET BY MOUTH ONCEDAILY AT BEDTIME     traMADol (ULTRAM) 50 MG tablet  (Patient not taking: Reported on 12/29/2021)     No current facility-administered medications for this visit.    PAST MEDICAL HISTORY: Past Medical History:  Diagnosis Date   Anemia    Anxiety    Aortic atherosclerosis (HCC)    Breast cancer, stage 1, estrogen receptor negative ([place]) 07/06/2011   Cancer of breast (HCC)    Cataract    Chronic ITP (idiopathic thrombocytopenia) (HCC) 07/06/2011   Constipation    takes Colace daily   COPD (chronic obstructive pulmonary disease) (HCC)    mild   Depression    "nervous break down age 26"   Fast heart beat    GERD (gastroesophageal reflux disease)    takes Nexium daily   History of colon polyps    History of depression    received shock treatments    History of palpitations    takes Metoprolol daily   History of vertigo    HOH (hard of hearing)    has hearing aids   Hyperlipidemia    takes Lovastatin daily   Hypertension    Hypothyroid 07/06/2011   takes SYnthroid daily   Joint pain    Joint swelling    Lymphoma ([place])    Lymphoma ([place])    B cell    Lymphoma of lymph nodes of head, face, and/or neck ([place]) 07/06/2011   Neuropathy    Nocturia    Osteopenia    Osteoporosis    hx of and take Reclast   Peripheral neuropathy    Personal history of chemotherapy    Personal history of radiation therapy    Right knee DJD    Shortness of breath dyspnea    with exertion   Thyroid disease    Umbilical hernia    Vitamin D deficiency     PAST SURGICAL HISTORY: Past Surgical History:  Procedure Laterality Date   ADENOIDECTOMY     as a child   BREAST LUMPECTOMY Right 1994   BREAST SURGERY Right    BUNIONECTOMY Right    CARDIAC CATHETERIZATION  2014    COLONOSCOPY     FOOT SURGERY Right  FRACTURE SURGERY     wrist right , right knee   INSERTION OF MESH N/A 04/17/2015   Procedure: INSERTION OF MESH;  Surgeon: Ralene Ok, MD;  Location: [place];  Service: General;  Laterality: N/A;   KNEE SURGERY     LEFT HEART CATHETERIZATION WITH CORONARY ANGIOGRAM N/A 12/05/2012   Procedure: LEFT HEART CATHETERIZATION WITH CORONARY ANGIOGRAM;  Surgeon: [name] Sine, MD;  Location[facility];  Service: Cardiovascular;  Laterality: N/A;   right  breast surgery     d/t breast cancer   TONSILLECTOMY     as a child   TOTAL KNEE ARTHROPLASTY Right 06/19/2013   Procedure: TOTAL KNEE ARTHROPLASTY;  Surgeon: Lorn Junes, MD;  Location: [place];  Service: Orthopedics;  Laterality: Right;   TRANSURETHRAL RESECTION OF BLADDER TUMOR WITH GYRUS (TURBT-GYRUS)     01-26-18 Dr. Louis Meckel   TRANSURETHRAL RESECTION OF BLADDER TUMOR WITH MITOMYCIN-C Bilateral 01/26/2018   Procedure: TRANSURETHRAL RESECTION OF BLADDER TUMOR WITH GEMCITABIN BLLATERAL RETROGRADE PYELOGRAM;  Surgeon: Ardis Hughs, MD;  [facility];  Service: Urology;  Laterality: Bilateral;   TURBINATE REDUCTION     UMBILICAL HERNIA REPAIR N/A 04/17/2015   Procedure: LAPAROSCOPIC UMBILICAL HERNIA REPAIR WITH MESH;  Surgeon: Ralene Ok, MD;  Location: [place];  Service: General;  Laterality: N/A;   UPPER GASTROINTESTINAL ENDOSCOPY      FAMILY HISTORY: Family History  Problem Relation Age of Onset   Other Mother        C Diff   Hyperlipidemia Father    Heart disease Father    Heart disease Sister    Cancer Brother        Agent orange   Heart disease Maternal Grandfather    Diabetes Son    Mood Disorder Daughter    Hypertension Daughter    Colon cancer Neg Hx    Breast cancer Neg Hx     SOCIAL HISTORY: Social History   Socioeconomic History   Marital status: Widowed    Spouse name: Not on file   Number of children: 2   Years of education: HS   Highest education level:  Not on file  Occupational History   Occupation: Retired  Tobacco Use   Smoking status: Former    Packs/day: 0.30    Types: Cigarettes    Quit date: 1985    Years since quitting: 38.6   Smokeless tobacco: Never   Tobacco comments:    quit smoking 1985  was a casual smoker  Vaping Use   Vaping Use: Never used  Substance and Sexual Activity   Alcohol use: No    Alcohol/week: 0.0 standard drinks of alcohol   Drug use: No   Sexual activity: Not Currently    Birth control/protection: Post-menopausal  Other Topics Concern   Not on file  Social History Narrative   Lives at home alone.   Right-handed.   4 cups of caffeine daily.   Social Determinants of Health   Financial Resource Strain: Not on file  Food Insecurity: Not on file  Transportation Needs: Not on file  Physical Activity: Not on file  Stress: Not on file  Social Connections: Not on file  Intimate Partner Violence: Not on file      Marcial Pacas, M.D. Ph.D.  [facility] [address] [place], [place] [address] Ph: [phone] Fax: [phone]  CC:  Holland Commons, [place],  [place] [phone]  Hol[facility] Commons,  FNP    Total time spent reviewing the chart, obtaining history, examined patient, ordering tests, documentation, consultations and family, care coordination was 65 minutes

## 2021-12-29 NOTE — Telephone Encounter (Signed)
Sent to Icard with [facility]  to see if he would be able to take the patient.

## 2021-12-30 ENCOUNTER — Telehealth: Payer: Self-pay | Admitting: Neurology

## 2021-12-30 LAB — RPR: RPR Ser Ql: NONREACTIVE

## 2021-12-30 LAB — CK: Total CK: 100 U/L (ref 26–161)

## 2021-12-30 LAB — TSH: TSH: 0.468 u[IU]/mL (ref 0.450–4.500)

## 2021-12-30 NOTE — Telephone Encounter (Signed)
Medicare [place] case #[id number] sent to GI

## 2021-12-30 NOTE — Telephone Encounter (Signed)
Marjory Lies sent me a message stating he is able to take the patient.

## 2021-12-31 ENCOUNTER — Telehealth: Payer: Self-pay

## 2021-12-31 NOTE — Telephone Encounter (Signed)
-----   Message from Marcial Pacas, MD sent at 12/30/2021  5:03 PM EDT ----- Please call patient, laboratory evaluation showed no significant abnormalities.

## 2021-12-31 NOTE — Telephone Encounter (Signed)
I left a VM for the patient on the provided number (as per DPR). I provided normal results with the office number for a return call.

## 2022-01-01 NOTE — Telephone Encounter (Signed)
He also stated:   "Apparently Gait Abnormality, Cognitive Impairment, Speech Distrubance are symptoms and not true Diagnoses. The issue is what event, disagnosis, etc caused those things is what we need. "

## 2022-01-01 NOTE — Telephone Encounter (Signed)
Dementia would the the diagnosis.

## 2022-01-01 NOTE — Telephone Encounter (Signed)
Darlene [name] with [facility] emailed me asking me:  "the visit diagnoses are not qualifying diagnoses, but defined as symptoms per CMS for the referral Darlene [name]. I simply need to know if [name] Referral for the Therapy is related to her fall and concussion back in June?"

## 2022-01-01 NOTE — Telephone Encounter (Signed)
Yes, could use dementia, gait abnormality as diagnosis

## 2022-01-06 ENCOUNTER — Ambulatory Visit (INDEPENDENT_AMBULATORY_CARE_PROVIDER_SITE_OTHER): Payer: Medicare Other | Admitting: Internal Medicine

## 2022-01-06 ENCOUNTER — Other Ambulatory Visit (INDEPENDENT_AMBULATORY_CARE_PROVIDER_SITE_OTHER): Payer: Medicare Other

## 2022-01-06 ENCOUNTER — Encounter: Payer: Self-pay | Admitting: Internal Medicine

## 2022-01-06 VITALS — BP 116/64 | HR 71 | Ht 60.0 in | Wt 169.4 lb

## 2022-01-06 DIAGNOSIS — D509 Iron deficiency anemia, unspecified: Secondary | ICD-10-CM

## 2022-01-06 LAB — CBC WITH DIFFERENTIAL/PLATELET
Basophils Absolute: 0.1 10*3/uL (ref 0.0–0.1)
Basophils Relative: 0.7 % (ref 0.0–3.0)
Eosinophils Absolute: 0.2 10*3/uL (ref 0.0–0.7)
Eosinophils Relative: 2.5 % (ref 0.0–5.0)
HCT: 39.1 % (ref 36.0–46.0)
Hemoglobin: 12.9 g/dL (ref 12.0–15.0)
Lymphocytes Relative: 22.5 % (ref 12.0–46.0)
Lymphs Abs: 1.8 10*3/uL (ref 0.7–4.0)
MCHC: 32.9 g/dL (ref 30.0–36.0)
MCV: 87.8 fl (ref 78.0–100.0)
Monocytes Absolute: 0.5 10*3/uL (ref 0.1–1.0)
Monocytes Relative: 6.1 % (ref 3.0–12.0)
Neutro Abs: 5.4 10*3/uL (ref 1.4–7.7)
Neutrophils Relative %: 68.2 % (ref 43.0–77.0)
Platelets: 137 10*3/uL — ABNORMAL LOW (ref 150.0–400.0)
RBC: 4.45 Mil/uL (ref 3.87–5.11)
RDW: 14.5 % (ref 11.5–15.5)
WBC: 7.9 10*3/uL (ref 4.0–10.5)

## 2022-01-06 LAB — FERRITIN: Ferritin: 20.7 ng/mL (ref 10.0–291.0)

## 2022-01-06 NOTE — Progress Notes (Signed)
Darlene [name] 87 y.o. June 27, 1934 [id number]  Assessment & Plan:   Encounter Diagnosis  Name Primary?   Iron deficiency anemia, unspecified iron deficiency anemia type Yes    Lab Results  Component Value Date   WBC 7.9 01/06/2022   HGB 12.9 01/06/2022   HCT 39.1 01/06/2022   MCV 87.8 01/06/2022   PLT 137.0 (L) 01/06/2022   Lab Results  Component Value Date   FERRITIN 20.7 01/06/2022   Hemoglobin remains normal ferritin has climbed from 9-20.  Though we do not know I do have a suspicion for nutritional iron deficiency anemia.  She is a poor candidate for EGD and colonoscopy, I think.  We have decided not to pursue endoscopic evaluation based upon the risk-benefit ratio that is perceived.  She should continue her iron supplementation and follow-up with her other providers and see GI as needed.  CC: Holland Commons, FNP Dr. Heath Lark  Subjective:   Chief Complaint: Iron deficiency anemia  HPI 87 year old white woman seen in July by Nicoletta Ba, she has iron deficiency anemia in the setting of a history of ITP and mild thrombocytopenia, breast cancer, non-Hodgkin's lymphoma, renal cell cancer, hypertension, COPD and hypothyroidism, and has recently been diagnosed with dementia by neurology.  She is here by herself and is a good historian.  She has not had any signs of GI bleeding.  She was also seen in May in our office.  She had been having falls and a concussion and the decision was made not to proceed with EGD and colonoscopy as originally planned.  She is comfortable with this.  She had a CT of the chest abdomen pelvis earlier this year all of which were negative.  Bowel habits are regular there is no dysphagia or upper GI symptoms reported.  Her hemoglobin which was mildly low has normalized on iron therapy.  We do not have a recent ferritin.  B12 was normal.  She had a normal colonoscopy in 2013/07/27.  She does not eat iron rich foods, I think.  [employer], biscuit film  and TV dinners and little or no red meat.  "I closed the kitchen after my husband died in 07-27-2012". Allergies  Allergen Reactions   Oxycodone Other (See Comments)    Made her depressed   Oxycodone Hcl     Other reaction(s): depressed feeling   Sulfamethoxazole-Trimethoprim     Other reaction(s): Unknown   Codeine Itching            Doxycycline Swelling and Other (See Comments)    Burns throat also   Meperidine And Related Itching and Rash    Makes cheeks red also   Sulfa Antibiotics Itching   Sulfacetamide Sodium Itching   Current Meds  Medication Sig   acetaminophen (TYLENOL) 500 MG tablet Take 1 tablet (500 mg total) by mouth every 6 (six) hours as needed for mild pain.   amoxicillin (AMOXIL) 500 MG capsule Take 2,000 mg by mouth once as needed (One hour prior to dental appointment).   aspirin 81 MG tablet Take 81 mg by mouth daily.   cholecalciferol (VITAMIN D) 1000 UNITS tablet Take 1,000 Units by mouth daily.   citalopram (CELEXA) 40 MG tablet Take 40 mg by mouth daily.    Clobetasol Propionate 0.05 % shampoo    Coenzyme Q10 (CO Q 10) 100 MG CAPS See admin instructions.   Cyanocobalamin (VITAMIN B-12 PO) Take 1,000 mcg by mouth daily.   diclofenac Sodium (VOLTAREN) 1 % GEL diclofenac  1 % topical gel   ergocalciferol (VITAMIN D2) 1.25 MG (50000 UT) capsule    esomeprazole (NEXIUM) 40 MG capsule Take 40 mg by mouth 2 (two) times daily before a meal.   famciclovir (FAMVIR) 125 MG tablet    Iron, Ferrous Sulfate, 325 (65 Fe) MG TABS Take 1 tablet by mouth daily.   levothyroxine (SYNTHROID, LEVOTHROID) 75 MCG tablet Take 75 mcg by mouth daily before breakfast.    lovastatin (MEVACOR) 20 MG tablet TAKE 1 TABLET BY MOUTH ONCEDAILY AT BEDTIME   magnesium oxide (MAG-OX) 400 MG tablet Take 400 mg by mouth daily.   metoprolol succinate (TOPROL-XL) 25 MG 24 hr tablet Take 25 mg by mouth daily.    mirabegron ER (MYRBETRIQ) 25 MG TB24 tablet Myrbetriq 25 mg tablet,extended release    Multiple Vitamins-Minerals (MULTIVITAMINS THER. W/MINERALS) TABS Take 1 tablet by mouth daily.   Polyethyl Glycol-Propyl Glycol 0.4-0.3 % SOLN Apply 2 drops to eye 2 (two) times daily as needed (for dry eyes).   Probiotic Product (PROBIOTIC PO) Take 1 tablet by mouth daily.   Past Medical History:  Diagnosis Date   Anemia    Anxiety    Aortic atherosclerosis ([place])    Breast cancer, stage 1, estrogen receptor negative ([place]) 07/06/2011   Cancer of breast ([place])    Cataract    Chronic ITP (idiopathic thrombocytopenia) (HCC) 07/06/2011   Constipation    takes Colace daily   COPD (chronic obstructive pulmonary disease) (HCC)    mild   Depression    "nervous break down age 87"   Fast heart beat    GERD (gastroesophageal reflux disease)    takes Nexium daily   History of colon polyps    History of depression    received shock treatments    History of palpitations    takes Metoprolol daily   History of vertigo    HOH (hard of hearing)    has hearing aids   Hyperlipidemia    takes Lovastatin daily   Hypertension    Hypothyroid 07/06/2011   takes SYnthroid daily   Joint pain    Joint swelling    Lymphoma ([place])    Lymphoma ([place])    B cell    Lymphoma of lymph nodes of head, face, and/or neck ([place]) 07/06/2011   Neuropathy    Nocturia    Osteopenia    Osteoporosis    hx of and take Reclast   Peripheral neuropathy    Personal history of chemotherapy    Personal history of radiation therapy    Right knee DJD    Shortness of breath dyspnea    with exertion   Thyroid disease    Umbilical hernia    Vitamin D deficiency    Past Surgical History:  Procedure Laterality Date   ADENOIDECTOMY     as a child   BREAST LUMPECTOMY Right 1994   BREAST SURGERY Right    BUNIONECTOMY Right    CARDIAC CATHETERIZATION  2014   COLONOSCOPY     FOOT SURGERY Right    FRACTURE SURGERY     wrist right , right knee   INSERTION OF MESH N/A 04/17/2015   Procedure: INSERTION OF MESH;   Surgeon: Ralene Ok, MD;  Location: [place];  Service: General;  Laterality: N/A;   KNEE SURGERY     LEFT HEART CATHETERIZATION WITH CORONARY ANGIOGRAM N/A 12/05/2012   Procedure: LEFT HEART CATHETERIZATION WITH CORONARY ANGIOGRAM;  Surgeon: [name] Sine, MD;  Location: [facility]  [facility];  Service: Cardiovascular;  Laterality: N/A;   right  breast surgery     d/t breast cancer   TONSILLECTOMY     as a child   TOTAL KNEE ARTHROPLASTY Right 06/19/2013   Procedure: TOTAL KNEE ARTHROPLASTY;  Surgeon: Lorn Junes, MD;  Location: [place];  Service: Orthopedics;  Laterality: Right;   TRANSURETHRAL RESECTION OF BLADDER TUMOR WITH GYRUS (TURBT-GYRUS)     01-26-18 Dr. Louis Meckel   TRANSURETHRAL RESECTION OF BLADDER TUMOR WITH MITOMYCIN-C Bilateral 01/26/2018   Procedure: TRANSURETHRAL RESECTION OF BLADDER TUMOR WITH GEMCITABIN BLLATERAL RETROGRADE PYELOGRAM;  Surgeon: Ardis Hughs, MD;  [facility];  Service: Urology;  Laterality: Bilateral;   TURBINATE REDUCTION     UMBILICAL HERNIA REPAIR N/A 04/17/2015   Procedure: LAPAROSCOPIC UMBILICAL HERNIA REPAIR WITH MESH;  Surgeon: Ralene Ok, MD;  Location: [place];  Service: General;  Laterality: N/A;   UPPER GASTROINTESTINAL ENDOSCOPY     Social History   Social History Narrative   Lives at home alone.   Right-handed.   4 cups of caffeine daily.   family history includes Cancer in her brother; Diabetes in her son; Heart disease in her father, maternal grandfather, and sister; Hyperlipidemia in her father; Hypertension in her daughter; Mood Disorder in her daughter; Other in her mother.   Review of Systems  As per HPI Objective:   Physical Exam BP 116/64   Pulse 71   Ht 5' (1.524 m)   Wt 169 lb 6.4 oz (76.8 kg)   SpO2 96%   BMI 33.08 kg/m  Elderly frail white woman in no acute distress, kyphotic using a walker to ambulate.  Answers questions appropriately and appears oriented to person place and time.  Data reviewed include labs  in the EMR since last visit here, prior GI visits in 2023 and recent hematology/oncology, neurology visits.

## 2022-01-06 NOTE — Patient Instructions (Signed)

## 2022-01-07 DIAGNOSIS — M810 Age-related osteoporosis without current pathological fracture: Secondary | ICD-10-CM | POA: Diagnosis not present

## 2022-01-13 ENCOUNTER — Ambulatory Visit
Admission: RE | Admit: 2022-01-13 | Discharge: 2022-01-13 | Disposition: A | Discharge status: Home / Self Care | Payer: Medicare Other | Source: Ambulatory Visit | Attending: Neurology | Admitting: Neurology

## 2022-01-13 DIAGNOSIS — R4189 Other symptoms and signs involving cognitive functions and awareness: Secondary | ICD-10-CM

## 2022-01-13 DIAGNOSIS — R479 Unspecified speech disturbances: Secondary | ICD-10-CM

## 2022-01-13 DIAGNOSIS — R269 Unspecified abnormalities of gait and mobility: Secondary | ICD-10-CM | POA: Diagnosis not present

## 2022-01-13 MED ORDER — GADOBENATE DIMEGLUMINE 529 MG/ML IV SOLN
16.0000 mL | Freq: Once | INTRAVENOUS | Status: AC | PRN
  Administered 2022-01-13: 16 mL via INTRAVENOUS

## 2022-01-15 ENCOUNTER — Telehealth: Payer: Self-pay | Admitting: Neurology

## 2022-01-15 DIAGNOSIS — H6042 Cholesteatoma of left external ear: Secondary | ICD-10-CM | POA: Diagnosis not present

## 2022-01-15 DIAGNOSIS — F329 Major depressive disorder, single episode, unspecified: Secondary | ICD-10-CM | POA: Diagnosis not present

## 2022-01-15 DIAGNOSIS — Z9181 History of falling: Secondary | ICD-10-CM | POA: Diagnosis not present

## 2022-01-15 DIAGNOSIS — C8591 Non-Hodgkin lymphoma, unspecified, lymph nodes of head, face, and neck: Secondary | ICD-10-CM | POA: Diagnosis not present

## 2022-01-15 DIAGNOSIS — Z7952 Long term (current) use of systemic steroids: Secondary | ICD-10-CM | POA: Diagnosis not present

## 2022-01-15 DIAGNOSIS — H9193 Unspecified hearing loss, bilateral: Secondary | ICD-10-CM | POA: Diagnosis not present

## 2022-01-15 DIAGNOSIS — M9963 Osseous and subluxation stenosis of intervertebral foramina of lumbar region: Secondary | ICD-10-CM | POA: Diagnosis not present

## 2022-01-15 DIAGNOSIS — I10 Essential (primary) hypertension: Secondary | ICD-10-CM | POA: Diagnosis not present

## 2022-01-15 DIAGNOSIS — M810 Age-related osteoporosis without current pathological fracture: Secondary | ICD-10-CM | POA: Diagnosis not present

## 2022-01-15 DIAGNOSIS — Z9011 Acquired absence of right breast and nipple: Secondary | ICD-10-CM | POA: Diagnosis not present

## 2022-01-15 DIAGNOSIS — M48061 Spinal stenosis, lumbar region without neurogenic claudication: Secondary | ICD-10-CM | POA: Diagnosis not present

## 2022-01-15 DIAGNOSIS — E039 Hypothyroidism, unspecified: Secondary | ICD-10-CM | POA: Diagnosis not present

## 2022-01-15 DIAGNOSIS — H6123 Impacted cerumen, bilateral: Secondary | ICD-10-CM | POA: Diagnosis not present

## 2022-01-15 DIAGNOSIS — G629 Polyneuropathy, unspecified: Secondary | ICD-10-CM | POA: Diagnosis not present

## 2022-01-15 DIAGNOSIS — G8929 Other chronic pain: Secondary | ICD-10-CM | POA: Diagnosis not present

## 2022-01-15 DIAGNOSIS — Z96651 Presence of right artificial knee joint: Secondary | ICD-10-CM | POA: Diagnosis not present

## 2022-01-15 DIAGNOSIS — Z8551 Personal history of malignant neoplasm of bladder: Secondary | ICD-10-CM | POA: Diagnosis not present

## 2022-01-15 DIAGNOSIS — F0280 Dementia in other diseases classified elsewhere without behavioral disturbance: Secondary | ICD-10-CM | POA: Diagnosis not present

## 2022-01-15 DIAGNOSIS — D638 Anemia in other chronic diseases classified elsewhere: Secondary | ICD-10-CM | POA: Diagnosis not present

## 2022-01-15 DIAGNOSIS — J449 Chronic obstructive pulmonary disease, unspecified: Secondary | ICD-10-CM | POA: Diagnosis not present

## 2022-01-15 DIAGNOSIS — E7800 Pure hypercholesterolemia, unspecified: Secondary | ICD-10-CM | POA: Diagnosis not present

## 2022-01-15 DIAGNOSIS — M5116 Intervertebral disc disorders with radiculopathy, lumbar region: Secondary | ICD-10-CM | POA: Diagnosis not present

## 2022-01-15 DIAGNOSIS — Z955 Presence of coronary angioplasty implant and graft: Secondary | ICD-10-CM | POA: Diagnosis not present

## 2022-01-15 DIAGNOSIS — Z853 Personal history of malignant neoplasm of breast: Secondary | ICD-10-CM | POA: Diagnosis not present

## 2022-01-15 NOTE — Telephone Encounter (Signed)
Please call patient, MRI of the brain showed moderate atrophy, moderate small vessel disease no acute abnormality  MRI of thoracic spine showed chronic compression fracture of T9, with 90% loss of vertebral body, retropulse [place], with mild canal stenosis, no significant change compared to previous scan in September 2021  If patient or her daughter has more questions, may set up in person or virtual visit before scheduled February 2024 visit with Janett Billow   IMPRESSION:    MRI brain (without) demonstrating: -Moderate atrophy / ventriculomegaly and mild to moderate chronic small vessel ischemic disease. -No acute findings.  IMPRESSION:    MRI thoracic spine (with and without) demonstrating: -Chronic compression fracture of T9 vertebral body with greater than 90% loss of vertebral body height.  Retropulsion measuring 5 mm and resulting in mild contact ventral spinal cord. -No significant change from 01/20/20.

## 2022-01-16 ENCOUNTER — Telehealth: Payer: Self-pay | Admitting: Neurology

## 2022-01-16 ENCOUNTER — Inpatient Hospital Stay (HOSPITAL_BASED_OUTPATIENT_CLINIC_OR_DEPARTMENT_OTHER): Payer: Medicare Other | Admitting: Hematology and Oncology

## 2022-01-16 ENCOUNTER — Encounter: Payer: Self-pay | Admitting: Hematology and Oncology

## 2022-01-16 ENCOUNTER — Other Ambulatory Visit: Payer: Self-pay

## 2022-01-16 ENCOUNTER — Inpatient Hospital Stay: Payer: Medicare Other | Attending: Hematology and Oncology

## 2022-01-16 VITALS — BP 118/64 | HR 63 | Temp 97.8°F | Resp 18 | Ht 60.0 in | Wt 170.4 lb

## 2022-01-16 DIAGNOSIS — Z79890 Hormone replacement therapy (postmenopausal): Secondary | ICD-10-CM | POA: Insufficient documentation

## 2022-01-16 DIAGNOSIS — Z882 Allergy status to sulfonamides status: Secondary | ICD-10-CM | POA: Insufficient documentation

## 2022-01-16 DIAGNOSIS — R296 Repeated falls: Secondary | ICD-10-CM | POA: Insufficient documentation

## 2022-01-16 DIAGNOSIS — Z881 Allergy status to other antibiotic agents status: Secondary | ICD-10-CM | POA: Diagnosis not present

## 2022-01-16 DIAGNOSIS — D693 Immune thrombocytopenic purpura: Secondary | ICD-10-CM

## 2022-01-16 DIAGNOSIS — Z79899 Other long term (current) drug therapy: Secondary | ICD-10-CM | POA: Insufficient documentation

## 2022-01-16 DIAGNOSIS — Z885 Allergy status to narcotic agent status: Secondary | ICD-10-CM | POA: Insufficient documentation

## 2022-01-16 LAB — CBC WITH DIFFERENTIAL/PLATELET
Abs Immature Granulocytes: 0.02 10*3/uL (ref 0.00–0.07)
Basophils Absolute: 0.1 10*3/uL (ref 0.0–0.1)
Basophils Relative: 1 %
Eosinophils Absolute: 0.2 10*3/uL (ref 0.0–0.5)
Eosinophils Relative: 2 %
HCT: 38 % (ref 36.0–46.0)
Hemoglobin: 12.5 g/dL (ref 12.0–15.0)
Immature Granulocytes: 0 %
Lymphocytes Relative: 24 %
Lymphs Abs: 1.6 10*3/uL (ref 0.7–4.0)
MCH: 28.9 pg (ref 26.0–34.0)
MCHC: 32.9 g/dL (ref 30.0–36.0)
MCV: 88 fL (ref 80.0–100.0)
Monocytes Absolute: 0.5 10*3/uL (ref 0.1–1.0)
Monocytes Relative: 7 %
Neutro Abs: 4.4 10*3/uL (ref 1.7–7.7)
Neutrophils Relative %: 66 %
Platelets: 115 10*3/uL — ABNORMAL LOW (ref 150–400)
RBC: 4.32 MIL/uL (ref 3.87–5.11)
RDW: 13.8 % (ref 11.5–15.5)
WBC: 6.6 10*3/uL (ref 4.0–10.5)
nRBC: 0 % (ref 0.0–0.2)

## 2022-01-16 NOTE — Assessment & Plan Note (Signed)
She has slight worsening thrombocytopenia since discontinuation of prednisone The patient is quite adamant that she will never go back on prednisone because it made her feel bad For now, there is no indication for treatment I plan to resume treatment once her platelet count drift closer to 50,000 Previously, her insurance has declined authorizing Promacta We will observe her closely I plan to see her back in 3 months for next follow-up

## 2022-01-16 NOTE — Assessment & Plan Note (Signed)
She has appointment pending to follow-up with neurologist

## 2022-01-16 NOTE — Progress Notes (Signed)
[facility] OFFICE PROGRESS NOTE  Patient Care Team: Holland Commons, FNP as PCP - General (Internal Medicine)  ASSESSMENT & PLAN:  Chronic ITP (idiopathic thrombocytopenia) ([place]) She has slight worsening thrombocytopenia since discontinuation of prednisone The patient is quite adamant that she will never go back on prednisone because it made her feel bad For now, there is no indication for treatment I plan to resume treatment once her platelet count drift closer to 50,000 Previously, her insurance has declined authorizing Promacta We will observe her closely I plan to see her back in 3 months for next follow-up  Recurrent falls She has appointment pending to follow-up with neurologist  No orders of the defined types were placed in this encounter.   All questions were answered. The patient knows to call the clinic with any problems, questions or concerns. The total time spent in the appointment was 20 minutes encounter with patients including review of chart and various tests results, discussions about plan of care and coordination of care plan   Heath Lark, MD 01/16/2022 12:39 PM  INTERVAL HISTORY: Please see below for problem oriented charting. she returns for surveillance follow-up for recurrent ITP She also have prior history of cancer She completed MRI evaluation recently per recommendation by neurologist Denies recent bleeding  REVIEW OF SYSTEMS:   Constitutional: Denies fevers, chills or abnormal weight loss Eyes: Denies blurriness of vision Ears, nose, mouth, throat, and face: Denies mucositis or sore throat Respiratory: Denies cough, dyspnea or wheezes Cardiovascular: Denies palpitation, chest discomfort or lower extremity swelling Gastrointestinal:  Denies nausea, heartburn or change in bowel habits Skin: Denies abnormal skin rashes Lymphatics: Denies new lymphadenopathy or easy bruising Neurological:Denies numbness, tingling or new  weaknesses Behavioral/Psych: Mood is stable, no new changes  All other systems were reviewed with the patient and are negative.  I have reviewed the past medical history, past surgical history, social history and family history with the patient and they are unchanged from previous note.  ALLERGIES:  is allergic to oxycodone, oxycodone hcl, sulfamethoxazole-trimethoprim, codeine, doxycycline, meperidine and related, sulfa antibiotics, and sulfacetamide sodium.  MEDICATIONS:  Current Outpatient Medications  Medication Sig Dispense Refill   acetaminophen (TYLENOL) 500 MG tablet Take 1 tablet (500 mg total) by mouth every 6 (six) hours as needed for mild pain. 30 tablet 0   amoxicillin (AMOXIL) 500 MG capsule Take 2,000 mg by mouth once as needed (One hour prior to dental appointment).     aspirin 81 MG tablet Take 81 mg by mouth daily.     betamethasone dipropionate 5.72 % cream 1 application (Patient not taking: Reported on 01/06/2022)     cholecalciferol (VITAMIN D) 1000 UNITS tablet Take 1,000 Units by mouth daily.     citalopram (CELEXA) 40 MG tablet Take 40 mg by mouth daily.      Clobetasol Propionate 0.05 % shampoo      Coenzyme Q10 (CO Q 10) 100 MG CAPS See admin instructions.     Cyanocobalamin (VITAMIN B-12 PO) Take 1,000 mcg by mouth daily.     diclofenac Sodium (VOLTAREN) 1 % GEL diclofenac 1 % topical gel     ergocalciferol (VITAMIN D2) 1.25 MG (50000 UT) capsule      esomeprazole (NEXIUM) 40 MG capsule Take 40 mg by mouth 2 (two) times daily before a meal.     famciclovir (FAMVIR) 125 MG tablet      Iron, Ferrous Sulfate, 325 (65 Fe) MG TABS Take 1 tablet by mouth daily.  levothyroxine (SYNTHROID, LEVOTHROID) 75 MCG tablet Take 75 mcg by mouth daily before breakfast.      lovastatin (MEVACOR) 20 MG tablet TAKE 1 TABLET BY MOUTH ONCEDAILY AT BEDTIME     magnesium oxide (MAG-OX) 400 MG tablet Take 400 mg by mouth daily.     metoprolol succinate (TOPROL-XL) 25 MG 24 hr tablet  Take 25 mg by mouth daily.      mirabegron ER (MYRBETRIQ) 25 MG TB24 tablet Myrbetriq 25 mg tablet,extended release     Multiple Vitamins-Minerals (MULTIVITAMINS THER. W/MINERALS) TABS Take 1 tablet by mouth daily.     Polyethyl Glycol-Propyl Glycol 0.4-0.3 % SOLN Apply 2 drops to eye 2 (two) times daily as needed (for dry eyes).     Probiotic Product (PROBIOTIC PO) Take 1 tablet by mouth daily.     traMADol (ULTRAM) 50 MG tablet  (Patient not taking: Reported on 01/06/2022)     No current facility-administered medications for this visit.    SUMMARY OF ONCOLOGIC HISTORY:  AYLSSA HERRIG was transferred to my care after her prior physician has left.  I reviewed the patient's records extensive and collaborated the history with the patient. 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 She never required any systemic treatment. 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 This has also resolved on its own. She has never developed any additional adenopathy other than the single lymph node palpable in the left supraclavicular fossa.  She did develop a second primary stage I,  0.8 cm,  ER negative cancer of the right breast, treated with lumpectomy, radiation and six cycles of CMF chemotherapy in 1994. No evidence of a new disease from the breast cancer either. Most recent mammogram done in June 2015 showed stable fibroglandular changes in her breasts. She was called back for an ultrasound of the left breast which also came back benign. She had a CT scan of the abdomen done on 11/14/15 because of significant intentional weight loss and abdominal discomfort.  Imaging studies show no evidence of lymphoma.  Moderate stool burden was noted. Repeat CT scan in March 2019 show stable  lymphadenopathy with no signs or symptoms of cancer recurrence On 01/26/2018, she underwent transurethral bladder resection of the tumor by urologist Between 2019-2023, she had recurrent ITP relapse On November 11, 2021, she has made informed decision to discontinue prednisone   PHYSICAL EXAMINATION: ECOG PERFORMANCE STATUS: 1 - Symptomatic but completely ambulatory  Vitals:   01/16/22 1211  BP: 118/64  Pulse: 63  Resp: 18  Temp: 97.8 F (36.6 C)  SpO2: 95%   Filed Weights   01/16/22 1211  Weight: 170 lb 6.4 oz (77.3 kg)    GENERAL:alert, no distress and comfortable NEURO: alert & oriented x 3 with fluent speech, no focal motor/sensory deficits  LABORATORY DATA:  I have reviewed the data as listed    Component Value Date/Time   NA 137 10/01/2021 1320   NA 135 (L) 07/07/2016 1035   K 3.9 10/01/2021 1320   K 4.8 07/07/2016 1035   CL 104 10/01/2021 1320   CL 106 07/11/2012 0906   CO2 27 10/01/2021 1320   CO2 24 07/07/2016 1035   GLUCOSE 110 (H) 10/01/2021 1320   GLUCOSE 85 07/07/2016 1035   GLUCOSE 107 (H) 07/11/2012 0906   BUN 15 10/01/2021 1320  BUN 19.9 07/07/2016 1035   CREATININE 0.70 10/01/2021 1320   CREATININE 0.7 07/07/2016 1035   CALCIUM 10.4 (H) 10/01/2021 1320   CALCIUM 10.2 07/07/2016 1035   PROT 6.9 10/01/2021 1320   PROT 6.6 07/07/2016 1035   ALBUMIN 4.3 10/01/2021 1320   ALBUMIN 3.9 07/07/2016 1035   AST 17 10/01/2021 1320   AST 14 07/07/2016 1035   ALT 17 10/01/2021 1320   ALT 17 07/07/2016 1035   ALKPHOS 46 10/01/2021 1320   ALKPHOS 58 07/07/2016 1035   BILITOT 1.4 (H) 10/01/2021 1320   BILITOT 0.61 07/07/2016 1035   GFRNONAA >60 10/01/2021 1320   GFRAA >60 05/19/2018 1551    No results found for: "SPEP", "UPEP"  Lab Results  Component Value Date   WBC 6.6 01/16/2022   NEUTROABS 4.4 01/16/2022   HGB 12.5 01/16/2022   HCT 38.0 01/16/2022   MCV 88.0 01/16/2022   PLT 115 (L) 01/16/2022      Chemistry      Component Value Date/Time    NA 137 10/01/2021 1320   NA 135 (L) 07/07/2016 1035   K 3.9 10/01/2021 1320   K 4.8 07/07/2016 1035   CL 104 10/01/2021 1320   CL 106 07/11/2012 0906   CO2 27 10/01/2021 1320   CO2 24 07/07/2016 1035   BUN 15 10/01/2021 1320   BUN 19.9 07/07/2016 1035   CREATININE 0.70 10/01/2021 1320   CREATININE 0.7 07/07/2016 1035      Component Value Date/Time   CALCIUM 10.4 (H) 10/01/2021 1320   CALCIUM 10.2 07/07/2016 1035   ALKPHOS 46 10/01/2021 1320   ALKPHOS 58 07/07/2016 1035   AST 17 10/01/2021 1320   AST 14 07/07/2016 1035   ALT 17 10/01/2021 1320   ALT 17 07/07/2016 1035   BILITOT 1.4 (H) 10/01/2021 1320   BILITOT 0.61 07/07/2016 1035

## 2022-01-16 NOTE — Telephone Encounter (Signed)
Gennaro Africa from [facility] called needing orders: Home Health PT  1X for 1 week 2x a week for 8 weeks

## 2022-01-19 NOTE — Telephone Encounter (Signed)
I returned Kate's call and provided verbal order for physical therapy.

## 2022-01-20 DIAGNOSIS — E039 Hypothyroidism, unspecified: Secondary | ICD-10-CM | POA: Diagnosis not present

## 2022-01-20 DIAGNOSIS — E785 Hyperlipidemia, unspecified: Secondary | ICD-10-CM | POA: Diagnosis not present

## 2022-01-20 DIAGNOSIS — M549 Dorsalgia, unspecified: Secondary | ICD-10-CM | POA: Diagnosis not present

## 2022-01-20 DIAGNOSIS — M810 Age-related osteoporosis without current pathological fracture: Secondary | ICD-10-CM | POA: Diagnosis not present

## 2022-01-20 DIAGNOSIS — M79642 Pain in left hand: Secondary | ICD-10-CM | POA: Diagnosis not present

## 2022-01-20 DIAGNOSIS — M6530 Trigger finger, unspecified finger: Secondary | ICD-10-CM | POA: Diagnosis not present

## 2022-01-20 DIAGNOSIS — M7989 Other specified soft tissue disorders: Secondary | ICD-10-CM | POA: Diagnosis not present

## 2022-01-20 DIAGNOSIS — M1990 Unspecified osteoarthritis, unspecified site: Secondary | ICD-10-CM | POA: Diagnosis not present

## 2022-01-20 DIAGNOSIS — M79643 Pain in unspecified hand: Secondary | ICD-10-CM | POA: Diagnosis not present

## 2022-01-20 DIAGNOSIS — M79641 Pain in right hand: Secondary | ICD-10-CM | POA: Diagnosis not present

## 2022-01-20 NOTE — Telephone Encounter (Signed)
I called the pt and updated the MRI of brain and thoracic spine. She verbalized understanding and appreciation for the call.

## 2022-01-21 DIAGNOSIS — M5116 Intervertebral disc disorders with radiculopathy, lumbar region: Secondary | ICD-10-CM | POA: Diagnosis not present

## 2022-01-21 DIAGNOSIS — G629 Polyneuropathy, unspecified: Secondary | ICD-10-CM | POA: Diagnosis not present

## 2022-01-21 DIAGNOSIS — I10 Essential (primary) hypertension: Secondary | ICD-10-CM | POA: Diagnosis not present

## 2022-01-21 DIAGNOSIS — J449 Chronic obstructive pulmonary disease, unspecified: Secondary | ICD-10-CM | POA: Diagnosis not present

## 2022-01-21 DIAGNOSIS — M48061 Spinal stenosis, lumbar region without neurogenic claudication: Secondary | ICD-10-CM | POA: Diagnosis not present

## 2022-01-21 DIAGNOSIS — M9963 Osseous and subluxation stenosis of intervertebral foramina of lumbar region: Secondary | ICD-10-CM | POA: Diagnosis not present

## 2022-01-22 DIAGNOSIS — J449 Chronic obstructive pulmonary disease, unspecified: Secondary | ICD-10-CM | POA: Diagnosis not present

## 2022-01-22 DIAGNOSIS — M9963 Osseous and subluxation stenosis of intervertebral foramina of lumbar region: Secondary | ICD-10-CM | POA: Diagnosis not present

## 2022-01-22 DIAGNOSIS — M48061 Spinal stenosis, lumbar region without neurogenic claudication: Secondary | ICD-10-CM | POA: Diagnosis not present

## 2022-01-22 DIAGNOSIS — G629 Polyneuropathy, unspecified: Secondary | ICD-10-CM | POA: Diagnosis not present

## 2022-01-22 DIAGNOSIS — M5116 Intervertebral disc disorders with radiculopathy, lumbar region: Secondary | ICD-10-CM | POA: Diagnosis not present

## 2022-01-22 DIAGNOSIS — I10 Essential (primary) hypertension: Secondary | ICD-10-CM | POA: Diagnosis not present

## 2022-01-26 DIAGNOSIS — M549 Dorsalgia, unspecified: Secondary | ICD-10-CM | POA: Diagnosis not present

## 2022-01-26 DIAGNOSIS — M1990 Unspecified osteoarthritis, unspecified site: Secondary | ICD-10-CM | POA: Diagnosis not present

## 2022-01-26 DIAGNOSIS — M6530 Trigger finger, unspecified finger: Secondary | ICD-10-CM | POA: Diagnosis not present

## 2022-01-26 DIAGNOSIS — M79643 Pain in unspecified hand: Secondary | ICD-10-CM | POA: Diagnosis not present

## 2022-01-26 DIAGNOSIS — M810 Age-related osteoporosis without current pathological fracture: Secondary | ICD-10-CM | POA: Diagnosis not present

## 2022-01-26 DIAGNOSIS — E039 Hypothyroidism, unspecified: Secondary | ICD-10-CM | POA: Diagnosis not present

## 2022-01-26 DIAGNOSIS — M7989 Other specified soft tissue disorders: Secondary | ICD-10-CM | POA: Diagnosis not present

## 2022-01-26 DIAGNOSIS — E785 Hyperlipidemia, unspecified: Secondary | ICD-10-CM | POA: Diagnosis not present

## 2022-01-26 DIAGNOSIS — M19049 Primary osteoarthritis, unspecified hand: Secondary | ICD-10-CM | POA: Diagnosis not present

## 2022-01-26 DIAGNOSIS — M79641 Pain in right hand: Secondary | ICD-10-CM | POA: Diagnosis not present

## 2022-01-27 DIAGNOSIS — I10 Essential (primary) hypertension: Secondary | ICD-10-CM | POA: Diagnosis not present

## 2022-01-27 DIAGNOSIS — J449 Chronic obstructive pulmonary disease, unspecified: Secondary | ICD-10-CM | POA: Diagnosis not present

## 2022-01-27 DIAGNOSIS — M5116 Intervertebral disc disorders with radiculopathy, lumbar region: Secondary | ICD-10-CM | POA: Diagnosis not present

## 2022-01-27 DIAGNOSIS — M9963 Osseous and subluxation stenosis of intervertebral foramina of lumbar region: Secondary | ICD-10-CM | POA: Diagnosis not present

## 2022-01-27 DIAGNOSIS — G629 Polyneuropathy, unspecified: Secondary | ICD-10-CM | POA: Diagnosis not present

## 2022-01-27 DIAGNOSIS — M48061 Spinal stenosis, lumbar region without neurogenic claudication: Secondary | ICD-10-CM | POA: Diagnosis not present

## 2022-01-28 DIAGNOSIS — M810 Age-related osteoporosis without current pathological fracture: Secondary | ICD-10-CM | POA: Diagnosis not present

## 2022-01-29 DIAGNOSIS — G629 Polyneuropathy, unspecified: Secondary | ICD-10-CM | POA: Diagnosis not present

## 2022-01-29 DIAGNOSIS — M9963 Osseous and subluxation stenosis of intervertebral foramina of lumbar region: Secondary | ICD-10-CM | POA: Diagnosis not present

## 2022-01-29 DIAGNOSIS — M48061 Spinal stenosis, lumbar region without neurogenic claudication: Secondary | ICD-10-CM | POA: Diagnosis not present

## 2022-01-29 DIAGNOSIS — M5116 Intervertebral disc disorders with radiculopathy, lumbar region: Secondary | ICD-10-CM | POA: Diagnosis not present

## 2022-01-29 DIAGNOSIS — J449 Chronic obstructive pulmonary disease, unspecified: Secondary | ICD-10-CM | POA: Diagnosis not present

## 2022-01-29 DIAGNOSIS — I10 Essential (primary) hypertension: Secondary | ICD-10-CM | POA: Diagnosis not present

## 2022-02-02 DIAGNOSIS — M5116 Intervertebral disc disorders with radiculopathy, lumbar region: Secondary | ICD-10-CM | POA: Diagnosis not present

## 2022-02-02 DIAGNOSIS — M48061 Spinal stenosis, lumbar region without neurogenic claudication: Secondary | ICD-10-CM | POA: Diagnosis not present

## 2022-02-02 DIAGNOSIS — G629 Polyneuropathy, unspecified: Secondary | ICD-10-CM | POA: Diagnosis not present

## 2022-02-02 DIAGNOSIS — M9963 Osseous and subluxation stenosis of intervertebral foramina of lumbar region: Secondary | ICD-10-CM | POA: Diagnosis not present

## 2022-02-02 DIAGNOSIS — J449 Chronic obstructive pulmonary disease, unspecified: Secondary | ICD-10-CM | POA: Diagnosis not present

## 2022-02-02 DIAGNOSIS — I10 Essential (primary) hypertension: Secondary | ICD-10-CM | POA: Diagnosis not present

## 2022-02-04 DIAGNOSIS — J449 Chronic obstructive pulmonary disease, unspecified: Secondary | ICD-10-CM | POA: Diagnosis not present

## 2022-02-04 DIAGNOSIS — M5116 Intervertebral disc disorders with radiculopathy, lumbar region: Secondary | ICD-10-CM | POA: Diagnosis not present

## 2022-02-04 DIAGNOSIS — M9963 Osseous and subluxation stenosis of intervertebral foramina of lumbar region: Secondary | ICD-10-CM | POA: Diagnosis not present

## 2022-02-04 DIAGNOSIS — I10 Essential (primary) hypertension: Secondary | ICD-10-CM | POA: Diagnosis not present

## 2022-02-04 DIAGNOSIS — M48061 Spinal stenosis, lumbar region without neurogenic claudication: Secondary | ICD-10-CM | POA: Diagnosis not present

## 2022-02-04 DIAGNOSIS — G629 Polyneuropathy, unspecified: Secondary | ICD-10-CM | POA: Diagnosis not present

## 2022-02-07 ENCOUNTER — Other Ambulatory Visit: Payer: Self-pay

## 2022-02-07 ENCOUNTER — Emergency Department (HOSPITAL_COMMUNITY): Payer: No Typology Code available for payment source

## 2022-02-07 ENCOUNTER — Emergency Department (HOSPITAL_COMMUNITY)
Admission: EM | Admit: 2022-02-07 | Discharge: 2022-02-07 | Disposition: A | Discharge status: Home / Self Care | Payer: No Typology Code available for payment source | Attending: Student | Admitting: Student

## 2022-02-07 ENCOUNTER — Encounter (HOSPITAL_COMMUNITY): Payer: Self-pay | Admitting: *Deleted

## 2022-02-07 DIAGNOSIS — Z743 Need for continuous supervision: Secondary | ICD-10-CM | POA: Diagnosis not present

## 2022-02-07 DIAGNOSIS — I10 Essential (primary) hypertension: Secondary | ICD-10-CM | POA: Diagnosis not present

## 2022-02-07 DIAGNOSIS — M542 Cervicalgia: Secondary | ICD-10-CM | POA: Diagnosis not present

## 2022-02-07 DIAGNOSIS — M25512 Pain in left shoulder: Secondary | ICD-10-CM | POA: Diagnosis not present

## 2022-02-07 DIAGNOSIS — S299XXA Unspecified injury of thorax, initial encounter: Secondary | ICD-10-CM | POA: Diagnosis not present

## 2022-02-07 DIAGNOSIS — Z7982 Long term (current) use of aspirin: Secondary | ICD-10-CM | POA: Diagnosis not present

## 2022-02-07 DIAGNOSIS — I2510 Atherosclerotic heart disease of native coronary artery without angina pectoris: Secondary | ICD-10-CM | POA: Insufficient documentation

## 2022-02-07 DIAGNOSIS — J984 Other disorders of lung: Secondary | ICD-10-CM | POA: Diagnosis not present

## 2022-02-07 DIAGNOSIS — S2242XA Multiple fractures of ribs, left side, initial encounter for closed fracture: Secondary | ICD-10-CM | POA: Diagnosis not present

## 2022-02-07 DIAGNOSIS — Z853 Personal history of malignant neoplasm of breast: Secondary | ICD-10-CM | POA: Diagnosis not present

## 2022-02-07 DIAGNOSIS — S51811A Laceration without foreign body of right forearm, initial encounter: Secondary | ICD-10-CM | POA: Diagnosis not present

## 2022-02-07 DIAGNOSIS — Z041 Encounter for examination and observation following transport accident: Secondary | ICD-10-CM | POA: Diagnosis not present

## 2022-02-07 DIAGNOSIS — Y92410 Unspecified street and highway as the place of occurrence of the external cause: Secondary | ICD-10-CM | POA: Diagnosis not present

## 2022-02-07 DIAGNOSIS — S52501A Unspecified fracture of the lower end of right radius, initial encounter for closed fracture: Secondary | ICD-10-CM | POA: Diagnosis not present

## 2022-02-07 DIAGNOSIS — E039 Hypothyroidism, unspecified: Secondary | ICD-10-CM | POA: Insufficient documentation

## 2022-02-07 DIAGNOSIS — M1611 Unilateral primary osteoarthritis, right hip: Secondary | ICD-10-CM | POA: Diagnosis not present

## 2022-02-07 DIAGNOSIS — Z87891 Personal history of nicotine dependence: Secondary | ICD-10-CM | POA: Insufficient documentation

## 2022-02-07 DIAGNOSIS — J449 Chronic obstructive pulmonary disease, unspecified: Secondary | ICD-10-CM | POA: Diagnosis not present

## 2022-02-07 DIAGNOSIS — R590 Localized enlarged lymph nodes: Secondary | ICD-10-CM | POA: Diagnosis not present

## 2022-02-07 DIAGNOSIS — S0990XA Unspecified injury of head, initial encounter: Secondary | ICD-10-CM | POA: Diagnosis not present

## 2022-02-07 DIAGNOSIS — I517 Cardiomegaly: Secondary | ICD-10-CM | POA: Diagnosis not present

## 2022-02-07 DIAGNOSIS — Z96651 Presence of right artificial knee joint: Secondary | ICD-10-CM | POA: Insufficient documentation

## 2022-02-07 DIAGNOSIS — F0390 Unspecified dementia without behavioral disturbance: Secondary | ICD-10-CM | POA: Insufficient documentation

## 2022-02-07 DIAGNOSIS — S3991XA Unspecified injury of abdomen, initial encounter: Secondary | ICD-10-CM | POA: Diagnosis not present

## 2022-02-07 DIAGNOSIS — S59911A Unspecified injury of right forearm, initial encounter: Secondary | ICD-10-CM | POA: Diagnosis present

## 2022-02-07 DIAGNOSIS — Z79899 Other long term (current) drug therapy: Secondary | ICD-10-CM | POA: Diagnosis not present

## 2022-02-07 DIAGNOSIS — K760 Fatty (change of) liver, not elsewhere classified: Secondary | ICD-10-CM | POA: Insufficient documentation

## 2022-02-07 LAB — CBC WITH DIFFERENTIAL/PLATELET
Abs Immature Granulocytes: 0.04 10*3/uL (ref 0.00–0.07)
Basophils Absolute: 0.1 10*3/uL (ref 0.0–0.1)
Basophils Relative: 1 %
Eosinophils Absolute: 0.1 10*3/uL (ref 0.0–0.5)
Eosinophils Relative: 1 %
HCT: 40.8 % (ref 36.0–46.0)
Hemoglobin: 13.3 g/dL (ref 12.0–15.0)
Immature Granulocytes: 0 %
Lymphocytes Relative: 16 %
Lymphs Abs: 1.4 10*3/uL (ref 0.7–4.0)
MCH: 29.3 pg (ref 26.0–34.0)
MCHC: 32.6 g/dL (ref 30.0–36.0)
MCV: 89.9 fL (ref 80.0–100.0)
Monocytes Absolute: 0.6 10*3/uL (ref 0.1–1.0)
Monocytes Relative: 6 %
Neutro Abs: 6.9 10*3/uL (ref 1.7–7.7)
Neutrophils Relative %: 76 %
Platelets: 111 10*3/uL — ABNORMAL LOW (ref 150–400)
RBC: 4.54 MIL/uL (ref 3.87–5.11)
RDW: 14.4 % (ref 11.5–15.5)
WBC: 9.1 10*3/uL (ref 4.0–10.5)
nRBC: 0 % (ref 0.0–0.2)

## 2022-02-07 LAB — COMPREHENSIVE METABOLIC PANEL
ALT: 19 U/L (ref 0–44)
AST: 17 U/L (ref 15–41)
Albumin: 3.6 g/dL (ref 3.5–5.0)
Alkaline Phosphatase: 54 U/L (ref 38–126)
Anion gap: 6 (ref 5–15)
BUN: 14 mg/dL (ref 8–23)
CO2: 24 mmol/L (ref 22–32)
Calcium: 9.8 mg/dL (ref 8.9–10.3)
Chloride: 108 mmol/L (ref 98–111)
Creatinine, Ser: 0.71 mg/dL (ref 0.44–1.00)
GFR, Estimated: >60 mL/min (ref >60)
Glucose, Bld: 90 mg/dL (ref 70–99)
Potassium: 4.1 mmol/L (ref 3.5–5.1)
Sodium: 138 mmol/L (ref 135–145)
Total Bilirubin: 1.2 mg/dL (ref 0.3–1.2)
Total Protein: 5.9 g/dL — ABNORMAL LOW (ref 6.5–8.1)

## 2022-02-07 MED ORDER — IOHEXOL 350 MG/ML SOLN
75.0000 mL | Freq: Once | INTRAVENOUS | Status: AC | PRN
  Administered 2022-02-07: 75 mL via INTRAVENOUS
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
## 2022-02-09 DIAGNOSIS — G629 Polyneuropathy, unspecified: Secondary | ICD-10-CM | POA: Diagnosis not present

## 2022-02-09 DIAGNOSIS — J449 Chronic obstructive pulmonary disease, unspecified: Secondary | ICD-10-CM | POA: Diagnosis not present

## 2022-02-09 DIAGNOSIS — I10 Essential (primary) hypertension: Secondary | ICD-10-CM | POA: Diagnosis not present

## 2022-02-09 DIAGNOSIS — M5116 Intervertebral disc disorders with radiculopathy, lumbar region: Secondary | ICD-10-CM | POA: Diagnosis not present

## 2022-02-09 DIAGNOSIS — M9963 Osseous and subluxation stenosis of intervertebral foramina of lumbar region: Secondary | ICD-10-CM | POA: Diagnosis not present

## 2022-02-09 DIAGNOSIS — M48061 Spinal stenosis, lumbar region without neurogenic claudication: Secondary | ICD-10-CM | POA: Diagnosis not present

## 2022-02-11 DIAGNOSIS — J449 Chronic obstructive pulmonary disease, unspecified: Secondary | ICD-10-CM | POA: Diagnosis not present

## 2022-02-11 DIAGNOSIS — M5116 Intervertebral disc disorders with radiculopathy, lumbar region: Secondary | ICD-10-CM | POA: Diagnosis not present

## 2022-02-11 DIAGNOSIS — M48061 Spinal stenosis, lumbar region without neurogenic claudication: Secondary | ICD-10-CM | POA: Diagnosis not present

## 2022-02-11 DIAGNOSIS — G629 Polyneuropathy, unspecified: Secondary | ICD-10-CM | POA: Diagnosis not present

## 2022-02-11 DIAGNOSIS — I10 Essential (primary) hypertension: Secondary | ICD-10-CM | POA: Diagnosis not present

## 2022-02-11 DIAGNOSIS — M9963 Osseous and subluxation stenosis of intervertebral foramina of lumbar region: Secondary | ICD-10-CM | POA: Diagnosis not present

## 2022-02-12 ENCOUNTER — Telehealth: Payer: Self-pay

## 2022-02-12 NOTE — Telephone Encounter (Signed)
        Patient  visited [facility] on 10/7    Telephone encounter attempt :  1st  A HIPAA compliant voice message was left requesting a return call.  Instructed patient to call back    [facility]  [phone] [address], [place], [place] [address] Phone: [phone] Email: [email or web address]

## 2022-02-14 DIAGNOSIS — Z96651 Presence of right artificial knee joint: Secondary | ICD-10-CM | POA: Diagnosis not present

## 2022-02-14 DIAGNOSIS — E039 Hypothyroidism, unspecified: Secondary | ICD-10-CM | POA: Diagnosis not present

## 2022-02-14 DIAGNOSIS — F329 Major depressive disorder, single episode, unspecified: Secondary | ICD-10-CM | POA: Diagnosis not present

## 2022-02-14 DIAGNOSIS — C8591 Non-Hodgkin lymphoma, unspecified, lymph nodes of head, face, and neck: Secondary | ICD-10-CM | POA: Diagnosis not present

## 2022-02-14 DIAGNOSIS — Z9181 History of falling: Secondary | ICD-10-CM | POA: Diagnosis not present

## 2022-02-14 DIAGNOSIS — F0280 Dementia in other diseases classified elsewhere without behavioral disturbance: Secondary | ICD-10-CM | POA: Diagnosis not present

## 2022-02-14 DIAGNOSIS — H6042 Cholesteatoma of left external ear: Secondary | ICD-10-CM | POA: Diagnosis not present

## 2022-02-14 DIAGNOSIS — H9193 Unspecified hearing loss, bilateral: Secondary | ICD-10-CM | POA: Diagnosis not present

## 2022-02-14 DIAGNOSIS — E7800 Pure hypercholesterolemia, unspecified: Secondary | ICD-10-CM | POA: Diagnosis not present

## 2022-02-14 DIAGNOSIS — Z853 Personal history of malignant neoplasm of breast: Secondary | ICD-10-CM | POA: Diagnosis not present

## 2022-02-14 DIAGNOSIS — J449 Chronic obstructive pulmonary disease, unspecified: Secondary | ICD-10-CM | POA: Diagnosis not present

## 2022-02-14 DIAGNOSIS — H6123 Impacted cerumen, bilateral: Secondary | ICD-10-CM | POA: Diagnosis not present

## 2022-02-14 DIAGNOSIS — Z8551 Personal history of malignant neoplasm of bladder: Secondary | ICD-10-CM | POA: Diagnosis not present

## 2022-02-14 DIAGNOSIS — M5116 Intervertebral disc disorders with radiculopathy, lumbar region: Secondary | ICD-10-CM | POA: Diagnosis not present

## 2022-02-14 DIAGNOSIS — G8929 Other chronic pain: Secondary | ICD-10-CM | POA: Diagnosis not present

## 2022-02-14 DIAGNOSIS — M810 Age-related osteoporosis without current pathological fracture: Secondary | ICD-10-CM | POA: Diagnosis not present

## 2022-02-14 DIAGNOSIS — I10 Essential (primary) hypertension: Secondary | ICD-10-CM | POA: Diagnosis not present

## 2022-02-14 DIAGNOSIS — M9963 Osseous and subluxation stenosis of intervertebral foramina of lumbar region: Secondary | ICD-10-CM | POA: Diagnosis not present

## 2022-02-14 DIAGNOSIS — Z7952 Long term (current) use of systemic steroids: Secondary | ICD-10-CM | POA: Diagnosis not present

## 2022-02-14 DIAGNOSIS — Z9011 Acquired absence of right breast and nipple: Secondary | ICD-10-CM | POA: Diagnosis not present

## 2022-02-14 DIAGNOSIS — Z955 Presence of coronary angioplasty implant and graft: Secondary | ICD-10-CM | POA: Diagnosis not present

## 2022-02-14 DIAGNOSIS — D638 Anemia in other chronic diseases classified elsewhere: Secondary | ICD-10-CM | POA: Diagnosis not present

## 2022-02-14 DIAGNOSIS — M48061 Spinal stenosis, lumbar region without neurogenic claudication: Secondary | ICD-10-CM | POA: Diagnosis not present

## 2022-02-14 DIAGNOSIS — G629 Polyneuropathy, unspecified: Secondary | ICD-10-CM | POA: Diagnosis not present

## 2022-02-17 DIAGNOSIS — I10 Essential (primary) hypertension: Secondary | ICD-10-CM | POA: Diagnosis not present

## 2022-02-17 DIAGNOSIS — M5116 Intervertebral disc disorders with radiculopathy, lumbar region: Secondary | ICD-10-CM | POA: Diagnosis not present

## 2022-02-17 DIAGNOSIS — M9963 Osseous and subluxation stenosis of intervertebral foramina of lumbar region: Secondary | ICD-10-CM | POA: Diagnosis not present

## 2022-02-17 DIAGNOSIS — M48061 Spinal stenosis, lumbar region without neurogenic claudication: Secondary | ICD-10-CM | POA: Diagnosis not present

## 2022-02-17 DIAGNOSIS — G629 Polyneuropathy, unspecified: Secondary | ICD-10-CM | POA: Diagnosis not present

## 2022-02-17 DIAGNOSIS — J449 Chronic obstructive pulmonary disease, unspecified: Secondary | ICD-10-CM | POA: Diagnosis not present

## 2022-02-19 DIAGNOSIS — Z09 Encounter for follow-up examination after completed treatment for conditions other than malignant neoplasm: Secondary | ICD-10-CM | POA: Diagnosis not present

## 2022-02-19 DIAGNOSIS — Z23 Encounter for immunization: Secondary | ICD-10-CM | POA: Diagnosis not present

## 2022-02-19 DIAGNOSIS — R413 Other amnesia: Secondary | ICD-10-CM | POA: Diagnosis not present

## 2022-02-23 DIAGNOSIS — H6123 Impacted cerumen, bilateral: Secondary | ICD-10-CM | POA: Diagnosis not present

## 2022-02-24 ENCOUNTER — Ambulatory Visit (INDEPENDENT_AMBULATORY_CARE_PROVIDER_SITE_OTHER): Payer: Medicare Other | Admitting: Podiatry

## 2022-02-24 ENCOUNTER — Encounter: Payer: Self-pay | Admitting: Podiatry

## 2022-02-24 DIAGNOSIS — M79676 Pain in unspecified toe(s): Secondary | ICD-10-CM | POA: Diagnosis not present

## 2022-02-24 DIAGNOSIS — M2031 Hallux varus (acquired), right foot: Secondary | ICD-10-CM | POA: Diagnosis not present

## 2022-02-24 DIAGNOSIS — B351 Tinea unguium: Secondary | ICD-10-CM | POA: Diagnosis not present

## 2022-02-24 DIAGNOSIS — M2030 Hallux varus (acquired), unspecified foot: Secondary | ICD-10-CM | POA: Insufficient documentation

## 2022-02-24 DIAGNOSIS — D696 Thrombocytopenia, unspecified: Secondary | ICD-10-CM

## 2022-02-24 NOTE — Progress Notes (Signed)
This patient returns to the office for evaluation and treatment of long thick painful nails .  This patient is unable to trim her own nails since the patient cannot reach her feet.  Patient says the nails are painful walking and wearing his shoes.  She returns for preventive foot care services.  General Appearance  Alert, conversant and in no acute stress.  Vascular  Dorsalis pedis and posterior tibial  pulses are palpable  bilaterally.  Capillary return is within normal limits  bilaterally. Temperature is within normal limits  bilaterally.  Neurologic  Senn-Weinstein monofilament wire test within normal limits  bilaterally. Muscle power within normal limits bilaterally.  Nails Thick disfigured discolored nails with subungual debris  from hallux to fifth toes bilaterally. No evidence of bacterial infection or drainage bilaterally.  Orthopedic  No limitations of motion  feet .  No crepitus or effusions noted.  No bony pathology or digital deformities noted.  Hallux varus 1st MPJ and hallux malleus right foot.   Hammer toes 2,3 right.  Skin  normotropic skin with no porokeratosis noted bilaterally.  No signs of infections or ulcers noted.     Onychomycosis  Pain in toes right foot  Pain in toes left foot  Debridement  of nails  1-5  B/L with a nail nipper.  Nails were then filed using a dremel tool with no incidents.  Her hallux malleus is painful so padding dispensed.   RTC 3 months    Gardiner Barefoot DPM

## 2022-02-25 DIAGNOSIS — J449 Chronic obstructive pulmonary disease, unspecified: Secondary | ICD-10-CM | POA: Diagnosis not present

## 2022-02-25 DIAGNOSIS — M5116 Intervertebral disc disorders with radiculopathy, lumbar region: Secondary | ICD-10-CM | POA: Diagnosis not present

## 2022-02-25 DIAGNOSIS — I10 Essential (primary) hypertension: Secondary | ICD-10-CM | POA: Diagnosis not present

## 2022-02-25 DIAGNOSIS — M48061 Spinal stenosis, lumbar region without neurogenic claudication: Secondary | ICD-10-CM | POA: Diagnosis not present

## 2022-02-25 DIAGNOSIS — G629 Polyneuropathy, unspecified: Secondary | ICD-10-CM | POA: Diagnosis not present

## 2022-02-25 DIAGNOSIS — M9963 Osseous and subluxation stenosis of intervertebral foramina of lumbar region: Secondary | ICD-10-CM | POA: Diagnosis not present

## 2022-02-26 DIAGNOSIS — M9963 Osseous and subluxation stenosis of intervertebral foramina of lumbar region: Secondary | ICD-10-CM | POA: Diagnosis not present

## 2022-02-26 DIAGNOSIS — M5116 Intervertebral disc disorders with radiculopathy, lumbar region: Secondary | ICD-10-CM | POA: Diagnosis not present

## 2022-02-26 DIAGNOSIS — M48061 Spinal stenosis, lumbar region without neurogenic claudication: Secondary | ICD-10-CM | POA: Diagnosis not present

## 2022-02-26 DIAGNOSIS — G629 Polyneuropathy, unspecified: Secondary | ICD-10-CM | POA: Diagnosis not present

## 2022-02-26 DIAGNOSIS — I10 Essential (primary) hypertension: Secondary | ICD-10-CM | POA: Diagnosis not present

## 2022-02-26 DIAGNOSIS — J449 Chronic obstructive pulmonary disease, unspecified: Secondary | ICD-10-CM | POA: Diagnosis not present

## 2022-03-03 DIAGNOSIS — C672 Malignant neoplasm of lateral wall of bladder: Secondary | ICD-10-CM | POA: Diagnosis not present

## 2022-03-04 DIAGNOSIS — I10 Essential (primary) hypertension: Secondary | ICD-10-CM | POA: Diagnosis not present

## 2022-03-04 DIAGNOSIS — J449 Chronic obstructive pulmonary disease, unspecified: Secondary | ICD-10-CM | POA: Diagnosis not present

## 2022-03-04 DIAGNOSIS — M5116 Intervertebral disc disorders with radiculopathy, lumbar region: Secondary | ICD-10-CM | POA: Diagnosis not present

## 2022-03-04 DIAGNOSIS — M48061 Spinal stenosis, lumbar region without neurogenic claudication: Secondary | ICD-10-CM | POA: Diagnosis not present

## 2022-03-04 DIAGNOSIS — M9963 Osseous and subluxation stenosis of intervertebral foramina of lumbar region: Secondary | ICD-10-CM | POA: Diagnosis not present

## 2022-03-04 DIAGNOSIS — G629 Polyneuropathy, unspecified: Secondary | ICD-10-CM | POA: Diagnosis not present

## 2022-03-05 DIAGNOSIS — M5116 Intervertebral disc disorders with radiculopathy, lumbar region: Secondary | ICD-10-CM | POA: Diagnosis not present

## 2022-03-05 DIAGNOSIS — G629 Polyneuropathy, unspecified: Secondary | ICD-10-CM | POA: Diagnosis not present

## 2022-03-05 DIAGNOSIS — M48061 Spinal stenosis, lumbar region without neurogenic claudication: Secondary | ICD-10-CM | POA: Diagnosis not present

## 2022-03-05 DIAGNOSIS — M9963 Osseous and subluxation stenosis of intervertebral foramina of lumbar region: Secondary | ICD-10-CM | POA: Diagnosis not present

## 2022-03-05 DIAGNOSIS — J449 Chronic obstructive pulmonary disease, unspecified: Secondary | ICD-10-CM | POA: Diagnosis not present

## 2022-03-05 DIAGNOSIS — I10 Essential (primary) hypertension: Secondary | ICD-10-CM | POA: Diagnosis not present

## 2022-03-09 DIAGNOSIS — I10 Essential (primary) hypertension: Secondary | ICD-10-CM | POA: Diagnosis not present

## 2022-03-09 DIAGNOSIS — M48061 Spinal stenosis, lumbar region without neurogenic claudication: Secondary | ICD-10-CM | POA: Diagnosis not present

## 2022-03-09 DIAGNOSIS — M9963 Osseous and subluxation stenosis of intervertebral foramina of lumbar region: Secondary | ICD-10-CM | POA: Diagnosis not present

## 2022-03-09 DIAGNOSIS — J449 Chronic obstructive pulmonary disease, unspecified: Secondary | ICD-10-CM | POA: Diagnosis not present

## 2022-03-09 DIAGNOSIS — G629 Polyneuropathy, unspecified: Secondary | ICD-10-CM | POA: Diagnosis not present

## 2022-03-09 DIAGNOSIS — M5116 Intervertebral disc disorders with radiculopathy, lumbar region: Secondary | ICD-10-CM | POA: Diagnosis not present

## 2022-03-13 DIAGNOSIS — M9963 Osseous and subluxation stenosis of intervertebral foramina of lumbar region: Secondary | ICD-10-CM | POA: Diagnosis not present

## 2022-03-13 DIAGNOSIS — J449 Chronic obstructive pulmonary disease, unspecified: Secondary | ICD-10-CM | POA: Diagnosis not present

## 2022-03-13 DIAGNOSIS — M48061 Spinal stenosis, lumbar region without neurogenic claudication: Secondary | ICD-10-CM | POA: Diagnosis not present

## 2022-03-13 DIAGNOSIS — M5116 Intervertebral disc disorders with radiculopathy, lumbar region: Secondary | ICD-10-CM | POA: Diagnosis not present

## 2022-03-13 DIAGNOSIS — G629 Polyneuropathy, unspecified: Secondary | ICD-10-CM | POA: Diagnosis not present

## 2022-03-13 DIAGNOSIS — I10 Essential (primary) hypertension: Secondary | ICD-10-CM | POA: Diagnosis not present

## 2022-04-01 DIAGNOSIS — S92342A Displaced fracture of fourth metatarsal bone, left foot, initial encounter for closed fracture: Secondary | ICD-10-CM | POA: Diagnosis not present

## 2022-04-10 ENCOUNTER — Other Ambulatory Visit: Payer: Self-pay

## 2022-04-10 ENCOUNTER — Inpatient Hospital Stay (HOSPITAL_BASED_OUTPATIENT_CLINIC_OR_DEPARTMENT_OTHER): Payer: Medicare Other | Admitting: Hematology and Oncology

## 2022-04-10 ENCOUNTER — Inpatient Hospital Stay: Payer: Medicare Other | Attending: Hematology and Oncology

## 2022-04-10 ENCOUNTER — Encounter: Payer: Self-pay | Admitting: Hematology and Oncology

## 2022-04-10 VITALS — BP 118/59 | HR 73 | Temp 98.0°F | Resp 16 | Ht 60.0 in | Wt 166.8 lb

## 2022-04-10 DIAGNOSIS — D693 Immune thrombocytopenic purpura: Secondary | ICD-10-CM | POA: Insufficient documentation

## 2022-04-10 DIAGNOSIS — Z885 Allergy status to narcotic agent status: Secondary | ICD-10-CM | POA: Insufficient documentation

## 2022-04-10 DIAGNOSIS — Z881 Allergy status to other antibiotic agents status: Secondary | ICD-10-CM | POA: Diagnosis not present

## 2022-04-10 DIAGNOSIS — Z8572 Personal history of non-Hodgkin lymphomas: Secondary | ICD-10-CM | POA: Diagnosis not present

## 2022-04-10 DIAGNOSIS — Z882 Allergy status to sulfonamides status: Secondary | ICD-10-CM | POA: Insufficient documentation

## 2022-04-10 DIAGNOSIS — Z79899 Other long term (current) drug therapy: Secondary | ICD-10-CM | POA: Diagnosis not present

## 2022-04-10 LAB — CBC WITH DIFFERENTIAL/PLATELET
Abs Immature Granulocytes: 0.03 10*3/uL (ref 0.00–0.07)
Basophils Absolute: 0.1 10*3/uL (ref 0.0–0.1)
Basophils Relative: 1 %
Eosinophils Absolute: 0.1 10*3/uL (ref 0.0–0.5)
Eosinophils Relative: 1 %
HCT: 41.4 % (ref 36.0–46.0)
Hemoglobin: 13.5 g/dL (ref 12.0–15.0)
Immature Granulocytes: 0 %
Lymphocytes Relative: 20 %
Lymphs Abs: 1.6 10*3/uL (ref 0.7–4.0)
MCH: 29.9 pg (ref 26.0–34.0)
MCHC: 32.6 g/dL (ref 30.0–36.0)
MCV: 91.6 fL (ref 80.0–100.0)
Monocytes Absolute: 0.6 10*3/uL (ref 0.1–1.0)
Monocytes Relative: 7 %
Neutro Abs: 5.5 10*3/uL (ref 1.7–7.7)
Neutrophils Relative %: 71 %
Platelets: 145 10*3/uL — ABNORMAL LOW (ref 150–400)
RBC: 4.52 MIL/uL (ref 3.87–5.11)
RDW: 13.6 % (ref 11.5–15.5)
WBC: 7.8 10*3/uL (ref 4.0–10.5)
nRBC: 0 % (ref 0.0–0.2)

## 2022-04-10 NOTE — Progress Notes (Signed)
[place] OFFICE PROGRESS NOTE  Patient Care Team: Holland Commons, FNP as PCP - General (Internal Medicine)  ASSESSMENT & PLAN:  Chronic ITP (idiopathic thrombocytopenia) ([place]) She has stable platelet count I plan to resume treatment once her platelet count drift closer to 50,000 Previously, her insurance has declined authorizing Promacta We will observe her closely I plan to see her back in 6 months for next follow-up  History of non-Hodgkin's lymphoma She had imaging studies done that showed no evidence of disease She does not need long-term imaging or follow-up in that regard  No orders of the defined types were placed in this encounter.   All questions were answered. The patient knows to call the clinic with any problems, questions or concerns. The total time spent in the appointment was 20 minutes encounter with patients including review of chart and various tests results, discussions about plan of care and coordination of care plan   Heath Lark, MD 04/10/2022 2:50 PM  INTERVAL HISTORY: Please see below for problem oriented charting. she returns for follow-up for chronic ITP She is doing well No recent bleeding  REVIEW OF SYSTEMS:   Constitutional: Denies fevers, chills or abnormal weight loss Eyes: Denies blurriness of vision Ears, nose, mouth, throat, and face: Denies mucositis or sore throat Respiratory: Denies cough, dyspnea or wheezes Cardiovascular: Denies palpitation, chest discomfort or lower extremity swelling Gastrointestinal:  Denies nausea, heartburn or change in bowel habits Skin: Denies abnormal skin rashes Lymphatics: Denies new lymphadenopathy or easy bruising Neurological:Denies numbness, tingling or new weaknesses Behavioral/Psych: Mood is stable, no new changes  All other systems were reviewed with the patient and are negative.  I have reviewed the past medical history, past surgical history, social history and family history  with the patient and they are unchanged from previous note.  ALLERGIES:  is allergic to oxycodone, oxycodone hcl, sulfamethoxazole-trimethoprim, codeine, doxycycline, meperidine and related, sulfa antibiotics, and sulfacetamide sodium.  MEDICATIONS:  Current Outpatient Medications  Medication Sig Dispense Refill   acetaminophen (TYLENOL) 500 MG tablet Take 1 tablet (500 mg total) by mouth every 6 (six) hours as needed for mild pain. 30 tablet 0   amoxicillin (AMOXIL) 500 MG capsule Take 2,000 mg by mouth once as needed (One hour prior to dental appointment).     aspirin 81 MG tablet Take 81 mg by mouth daily.     betamethasone dipropionate 0.05 % cream      cholecalciferol (VITAMIN D) 1000 UNITS tablet Take 1,000 Units by mouth daily.     citalopram (CELEXA) 40 MG tablet Take 40 mg by mouth daily.      Clobetasol Propionate 0.05 % shampoo      Coenzyme Q10 (CO Q 10) 100 MG CAPS See admin instructions.     Cyanocobalamin (VITAMIN B-12 PO) Take 1,000 mcg by mouth daily.     diclofenac Sodium (VOLTAREN) 1 % GEL diclofenac 1 % topical gel     ergocalciferol (VITAMIN D2) 1.25 MG (50000 UT) capsule      esomeprazole (NEXIUM) 40 MG capsule Take 40 mg by mouth 2 (two) times daily before a meal.     famciclovir (FAMVIR) 125 MG tablet      Iron, Ferrous Sulfate, 325 (65 Fe) MG TABS Take 1 tablet by mouth daily.     levothyroxine (SYNTHROID, LEVOTHROID) 75 MCG tablet Take 75 mcg by mouth daily before breakfast.      lovastatin (MEVACOR) 20 MG tablet TAKE 1 TABLET BY MOUTH ONCEDAILY AT BEDTIME  magnesium oxide (MAG-OX) 400 MG tablet Take 400 mg by mouth daily.     metoprolol succinate (TOPROL-XL) 25 MG 24 hr tablet Take 25 mg by mouth daily.      mirabegron ER (MYRBETRIQ) 25 MG TB24 tablet Myrbetriq 25 mg tablet,extended release     Multiple Vitamins-Minerals (MULTIVITAMINS THER. W/MINERALS) TABS Take 1 tablet by mouth daily.     Polyethyl Glycol-Propyl Glycol 0.4-0.3 % SOLN Apply 2 drops to eye 2  (two) times daily as needed (for dry eyes).     Probiotic Product (PROBIOTIC PO) Take 1 tablet by mouth daily.     traMADol (ULTRAM) 50 MG tablet      No current facility-administered medications for this visit.    SUMMARY OF ONCOLOGIC HISTORY:  CELISA SCHOENBERG was transferred to my care after her prior physician has left.  I reviewed the patient's records extensive and collaborated the history with the patient. 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 She never required any systemic treatment. 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 This has also resolved on its own. She has never developed any additional adenopathy other than the single lymph node palpable in the left supraclavicular fossa.  She did develop a second primary stage I,  0.8 cm,  ER negative cancer of the right breast, treated with lumpectomy, radiation and six cycles of CMF chemotherapy in 1994. No evidence of a new disease from the breast cancer either. Most recent mammogram done in June 2015 showed stable fibroglandular changes in her breasts. She was called back for an ultrasound of the left breast which also came back benign. She had a CT scan of the abdomen done on 11/14/15 because of significant intentional weight loss and abdominal discomfort.  Imaging studies show no evidence of lymphoma.  Moderate stool burden was noted. Repeat CT scan in March 2019 show stable lymphadenopathy with no signs or symptoms of cancer recurrence On 01/26/2018, she underwent transurethral bladder resection of the tumor by urologist Between 2019-2023, she had recurrent ITP relapse On November 11, 2021, she has made informed decision to discontinue prednisone  PHYSICAL EXAMINATION: ECOG PERFORMANCE STATUS: 1 - Symptomatic but  completely ambulatory  Vitals:   04/10/22 1300  BP: (!) 118/59  Pulse: 73  Resp: 16  Temp: 98 F (36.7 C)  SpO2: 96%   Filed Weights   04/10/22 1300  Weight: 166 lb 12.8 oz (75.7 kg)    GENERAL:alert, no distress and comfortable NEURO: alert & oriented x 3 with fluent speech, no focal motor/sensory deficits  LABORATORY DATA:  I have reviewed the data as listed    Component Value Date/Time   NA 138 02/07/2022 1230   NA 135 (L) 07/07/2016 1035   K 4.1 02/07/2022 1230   K 4.8 07/07/2016 1035   CL 108 02/07/2022 1230   CL 106 07/11/2012 0906   CO2 24 02/07/2022 1230   CO2 24 07/07/2016 1035   GLUCOSE 90 02/07/2022 1230   GLUCOSE 85 07/07/2016 1035   GLUCOSE 107 (H) 07/11/2012 0906   BUN 14 02/07/2022 1230   BUN 19.9 07/07/2016 1035   CREATININE 0.71 02/07/2022 1230   CREATININE 0.70 10/01/2021 1320   CREATININE 0.7 07/07/2016 1035   CALCIUM 9.8 02/07/2022 1230   CALCIUM 10.2 07/07/2016 1035   PROT 5.9 (L) 02/07/2022 1230  PROT 6.6 07/07/2016 1035   ALBUMIN 3.6 02/07/2022 1230   ALBUMIN 3.9 07/07/2016 1035   AST 17 02/07/2022 1230   AST 17 10/01/2021 1320   AST 14 07/07/2016 1035   ALT 19 02/07/2022 1230   ALT 17 10/01/2021 1320   ALT 17 07/07/2016 1035   ALKPHOS 54 02/07/2022 1230   ALKPHOS 58 07/07/2016 1035   BILITOT 1.2 02/07/2022 1230   BILITOT 1.4 (H) 10/01/2021 1320   BILITOT 0.61 07/07/2016 1035   GFRNONAA >60 02/07/2022 1230   GFRNONAA >60 10/01/2021 1320   GFRAA >60 05/19/2018 1551    No results found for: "SPEP", "UPEP"  Lab Results  Component Value Date   WBC 7.8 04/10/2022   NEUTROABS 5.5 04/10/2022   HGB 13.5 04/10/2022   HCT 41.4 04/10/2022   MCV 91.6 04/10/2022   PLT 145 (L) 04/10/2022      Chemistry      Component Value Date/Time   NA 138 02/07/2022 1230   NA 135 (L) 07/07/2016 1035   K 4.1 02/07/2022 1230   K 4.8 07/07/2016 1035   CL 108 02/07/2022 1230   CL 106 07/11/2012 0906   CO2 24 02/07/2022 1230   CO2 24  07/07/2016 1035   BUN 14 02/07/2022 1230   BUN 19.9 07/07/2016 1035   CREATININE 0.71 02/07/2022 1230   CREATININE 0.70 10/01/2021 1320   CREATININE 0.7 07/07/2016 1035      Component Value Date/Time   CALCIUM 9.8 02/07/2022 1230   CALCIUM 10.2 07/07/2016 1035   ALKPHOS 54 02/07/2022 1230   ALKPHOS 58 07/07/2016 1035   AST 17 02/07/2022 1230   AST 17 10/01/2021 1320   AST 14 07/07/2016 1035   ALT 19 02/07/2022 1230   ALT 17 10/01/2021 1320   ALT 17 07/07/2016 1035   BILITOT 1.2 02/07/2022 1230   BILITOT 1.4 (H) 10/01/2021 1320   BILITOT 0.61 07/07/2016 1035

## 2022-04-10 NOTE — Assessment & Plan Note (Addendum)
She has stable platelet count I plan to resume treatment once her platelet count drift closer to 50,000 Previously, her insurance has declined authorizing Promacta We will observe her closely I plan to see her back in 6 months for next follow-up

## 2022-04-10 NOTE — Assessment & Plan Note (Signed)
She had imaging studies done that showed no evidence of disease She does not need long-term imaging or follow-up in that regard

## 2022-05-20 DIAGNOSIS — S92342A Displaced fracture of fourth metatarsal bone, left foot, initial encounter for closed fracture: Secondary | ICD-10-CM | POA: Diagnosis not present

## 2022-06-02 ENCOUNTER — Ambulatory Visit (INDEPENDENT_AMBULATORY_CARE_PROVIDER_SITE_OTHER): Payer: Medicare Other | Admitting: Podiatry

## 2022-06-02 ENCOUNTER — Encounter: Payer: Self-pay | Admitting: Podiatry

## 2022-06-02 DIAGNOSIS — B351 Tinea unguium: Secondary | ICD-10-CM | POA: Diagnosis not present

## 2022-06-02 DIAGNOSIS — M79676 Pain in unspecified toe(s): Secondary | ICD-10-CM

## 2022-06-02 DIAGNOSIS — M2031 Hallux varus (acquired), right foot: Secondary | ICD-10-CM | POA: Diagnosis not present

## 2022-06-02 DIAGNOSIS — D696 Thrombocytopenia, unspecified: Secondary | ICD-10-CM

## 2022-06-02 NOTE — Progress Notes (Signed)
This patient returns to the office for evaluation and treatment of long thick painful nails .  This patient is unable to trim her own nails since the patient cannot reach her feet.  Patient says the nails are painful walking and wearing his shoes.   She presents to the office with her daughter. She returns for preventive foot care services.  General Appearance  Alert, conversant and in no acute stress.  Vascular  Dorsalis pedis and posterior tibial  pulses are palpable  bilaterally.  Capillary return is within normal limits  bilaterally. Temperature is within normal limits  bilaterally.  Neurologic  Senn-Weinstein monofilament wire test within normal limits  bilaterally. Muscle power within normal limits bilaterally.  Nails Thick disfigured discolored nails with subungual debris  from hallux to fifth toes bilaterally. No evidence of bacterial infection or drainage bilaterally.  Orthopedic  No limitations of motion  feet .  No crepitus or effusions noted.  No bony pathology or digital deformities noted.  Hallux varus 1st MPJ and hallux malleus right foot.   Hammer toes 2,3 right.  Skin  normotropic skin with no porokeratosis noted bilaterally.  No signs of infections or ulcers noted.     Onychomycosis  Pain in toes right foot  Pain in toes left foot  Debridement  of nails  1-5  B/L with a nail nipper.  Nails were then filed using a dremel tool with no incidents.   RTC 3 months    Gardiner Barefoot DPM

## 2022-06-26 ENCOUNTER — Inpatient Hospital Stay: Payer: Medicare Other | Attending: Hematology and Oncology | Admitting: Hematology and Oncology

## 2022-06-26 ENCOUNTER — Other Ambulatory Visit: Payer: Self-pay

## 2022-06-26 ENCOUNTER — Encounter: Payer: Self-pay | Admitting: Hematology and Oncology

## 2022-06-26 VITALS — BP 103/50 | HR 78 | Resp 18 | Ht 60.0 in | Wt 172.0 lb

## 2022-06-26 DIAGNOSIS — Z882 Allergy status to sulfonamides status: Secondary | ICD-10-CM | POA: Insufficient documentation

## 2022-06-26 DIAGNOSIS — Z8572 Personal history of non-Hodgkin lymphomas: Secondary | ICD-10-CM | POA: Insufficient documentation

## 2022-06-26 DIAGNOSIS — Z79899 Other long term (current) drug therapy: Secondary | ICD-10-CM | POA: Insufficient documentation

## 2022-06-26 DIAGNOSIS — D693 Immune thrombocytopenic purpura: Secondary | ICD-10-CM | POA: Insufficient documentation

## 2022-06-26 DIAGNOSIS — Z881 Allergy status to other antibiotic agents status: Secondary | ICD-10-CM | POA: Diagnosis not present

## 2022-06-26 DIAGNOSIS — Z885 Allergy status to narcotic agent status: Secondary | ICD-10-CM | POA: Diagnosis not present

## 2022-06-26 NOTE — Assessment & Plan Note (Addendum)
The palpable lymphadenopathy on the left supraclavicular area was noted from prior exam The patient did not recall our prior conversation regarding this I showed her previous imaging study dated back to 2008 and the most recent imaging study from September 2023 The palpable nodule has been there before I do not recommend repeat biopsy or further investigation She appears to be comfortable with this plan of care We discussed signs and symptoms to watch out for recurrent lymphoma such as enlarging lymph nodes or other new lymphadenopathy I will see her again in June for further follow-up and I will reexamine her at that point in time

## 2022-06-26 NOTE — Progress Notes (Signed)
[name] OFF[facility] PROGRESS NOTE  Patient Care Team: Holland Commons, FNP as PCP - General (Internal Medicine)  ASSESSMENT & PLAN:  History of non-Hodgkin's lymphoma The palpable lymphadenopathy on the left supraclavicular area was noted from prior exam The patient did not recall our prior conversation regarding this I showed her previous imaging study dated back to 2008 and the most recent imaging study from September 2023 The palpable nodule has been there before I do not recommend repeat biopsy or further investigation She appears to be comfortable with this plan of care We discussed signs and symptoms to watch out for recurrent lymphoma such as enlarging lymph nodes or other new lymphadenopathy I will see her again in June for further follow-up and I will reexamine her at that point in time  No orders of the defined types were placed in this encounter.   All questions were answered. The patient knows to call the clinic with any problems, questions or concerns. The total time spent in the appointment was 20 minutes encounter with patients including review of chart and various tests results, discussions about plan of care and coordination of care plan   Heath Lark, MD 06/26/2022 12:36 PM  INTERVAL HISTORY: Please see below for problem oriented charting. she returns for urgent evaluation The patient felt that she has palpated a new lump in the left supraclavicular region She did not recall our prior conversation in the past I reminded her that this lump is not new and has been there before On review of prior imaging study, it has been there since 2008 from what I can download in the computer She denies pain, discomfort or enlargement of the nodule  REVIEW OF SYSTEMS:   Constitutional: Denies fevers, chills or abnormal weight loss Eyes: Denies blurriness of vision Ears, nose, mouth, throat, and face: Denies mucositis or sore throat Respiratory: Denies cough,  dyspnea or wheezes Cardiovascular: Denies palpitation, chest discomfort or lower extremity swelling Gastrointestinal:  Denies nausea, heartburn or change in bowel habits Skin: Denies abnormal skin rashes Lymphatics: Denies new lymphadenopathy or easy bruising Neurological:Denies numbness, tingling or new weaknesses Behavioral/Psych: Mood is stable, no new changes  All other systems were reviewed with the patient and are negative.  I have reviewed the past medical history, past surgical history, social history and family history with the patient and they are unchanged from previous note.  ALLERGIES:  is allergic to oxycodone, oxycodone hcl, sulfamethoxazole-trimethoprim, codeine, doxycycline, meperidine and related, sulfa antibiotics, and sulfacetamide sodium.  MEDICATIONS:  Current Outpatient Medications  Medication Sig Dispense Refill   acetaminophen (TYLENOL) 500 MG tablet Take 1 tablet (500 mg total) by mouth every 6 (six) hours as needed for mild pain. 30 tablet 0   amoxicillin (AMOXIL) 500 MG capsule Take 2,000 mg by mouth once as needed (One hour prior to dental appointment).     aspirin 81 MG tablet Take 81 mg by mouth daily.     betamethasone dipropionate 0.05 % cream      cholecalciferol (VITAMIN D) 1000 UNITS tablet Take 1,000 Units by mouth daily.     citalopram (CELEXA) 40 MG tablet Take 40 mg by mouth daily.      Clobetasol Propionate 0.05 % shampoo      Coenzyme Q10 (CO Q 10) 100 MG CAPS See admin instructions.     Cyanocobalamin (VITAMIN B-12 PO) Take 1,000 mcg by mouth daily.     diclofenac Sodium (VOLTAREN) 1 % GEL diclofenac 1 % topical gel  ergocalciferol (VITAMIN D2) 1.25 MG (50000 UT) capsule      esomeprazole (NEXIUM) 40 MG capsule Take 40 mg by mouth 2 (two) times daily before a meal.     famciclovir (FAMVIR) 125 MG tablet      Iron, Ferrous Sulfate, 325 (65 Fe) MG TABS Take 1 tablet by mouth daily.     levothyroxine (SYNTHROID, LEVOTHROID) 75 MCG tablet Take  75 mcg by mouth daily before breakfast.      lovastatin (MEVACOR) 20 MG tablet TAKE 1 TABLET BY MOUTH ONCEDAILY AT BEDTIME     magnesium oxide (MAG-OX) 400 MG tablet Take 400 mg by mouth daily.     metoprolol succinate (TOPROL-XL) 25 MG 24 hr tablet Take 25 mg by mouth daily.      mirabegron ER (MYRBETRIQ) 25 MG TB24 tablet Myrbetriq 25 mg tablet,extended release     Multiple Vitamins-Minerals (MULTIVITAMINS THER. W/MINERALS) TABS Take 1 tablet by mouth daily.     Polyethyl Glycol-Propyl Glycol 0.4-0.3 % SOLN Apply 2 drops to eye 2 (two) times daily as needed (for dry eyes).     Probiotic Product (PROBIOTIC PO) Take 1 tablet by mouth daily.     traMADol (ULTRAM) 50 MG tablet      No current facility-administered medications for this visit.    SUMMARY OF ONCOLOGIC HISTORY: Oncology History   No history exists.    PHYSICAL EXAMINATION: ECOG PERFORMANCE STATUS: 0 - Asymptomatic  Vitals:   06/26/22 1201  BP: (!) 103/50  Pulse: 78  Resp: 18  SpO2: 96%   Filed Weights   06/26/22 1201  Weight: 172 lb (78 kg)    GENERAL:alert, no distress and comfortable SKIN: skin color, texture, turgor are normal, no rashes or significant lesions EYES: normal, Conjunctiva are pink and non-injected, sclera clear OROPHARYNX:no exudate, no erythema and lips, buccal mucosa, and tongue normal  NECK: supple, thyroid normal size, non-tender, without nodularity LYMPH: The palpable nodule near her left clavicle has been there before, unchanged compared to prior exam LUNGS: clear to auscultation and percussion with normal breathing effort HEART: regular rate & rhythm and no murmurs and no lower extremity edema ABDOMEN:abdomen soft, non-tender and normal bowel sounds Musculoskeletal:no cyanosis of digits and no clubbing  NEURO: alert & oriented x 3 with fluent speech, no focal motor/sensory deficits  LABORATORY DATA:  I have reviewed the data as listed    Component Value Date/Time   NA 138 02/07/2022  1230   NA 135 (L) 07/07/2016 1035   K 4.1 02/07/2022 1230   K 4.8 07/07/2016 1035   CL 108 02/07/2022 1230   CL 106 07/11/2012 0906   CO2 24 02/07/2022 1230   CO2 24 07/07/2016 1035   GLUCOSE 90 02/07/2022 1230   GLUCOSE 85 07/07/2016 1035   GLUCOSE 107 (H) 07/11/2012 0906   BUN 14 02/07/2022 1230   BUN 19.9 07/07/2016 1035   CREATININE 0.71 02/07/2022 1230   CREATININE 0.70 10/01/2021 1320   CREATININE 0.7 07/07/2016 1035   CALCIUM 9.8 02/07/2022 1230   CALCIUM 10.2 07/07/2016 1035   PROT 5.9 (L) 02/07/2022 1230   PROT 6.6 07/07/2016 1035   ALBUMIN 3.6 02/07/2022 1230   ALBUMIN 3.9 07/07/2016 1035   AST 17 02/07/2022 1230   AST 17 10/01/2021 1320   AST 14 07/07/2016 1035   ALT 19 02/07/2022 1230   ALT 17 10/01/2021 1320   ALT 17 07/07/2016 1035   ALKPHOS 54 02/07/2022 1230   ALKPHOS 58 07/07/2016 1035   BILITOT  1.2 02/07/2022 1230   BILITOT 1.4 (H) 10/01/2021 1320   BILITOT 0.61 07/07/2016 1035   GFRNONAA >60 02/07/2022 1230   GFRNONAA >60 10/01/2021 1320   GFRAA >60 05/19/2018 1551    No results found for: "SPEP", "UPEP"  Lab Results  Component Value Date   WBC 7.8 04/10/2022   NEUTROABS 5.5 04/10/2022   HGB 13.5 04/10/2022   HCT 41.4 04/10/2022   MCV 91.6 04/10/2022   PLT 145 (L) 04/10/2022      Chemistry      Component Value Date/Time   NA 138 02/07/2022 1230   NA 135 (L) 07/07/2016 1035   K 4.1 02/07/2022 1230   K 4.8 07/07/2016 1035   CL 108 02/07/2022 1230   CL 106 07/11/2012 0906   CO2 24 02/07/2022 1230   CO2 24 07/07/2016 1035   BUN 14 02/07/2022 1230   BUN 19.9 07/07/2016 1035   CREATININE 0.71 02/07/2022 1230   CREATININE 0.70 10/01/2021 1320   CREATININE 0.7 07/07/2016 1035      Component Value Date/Time   CALCIUM 9.8 02/07/2022 1230   CALCIUM 10.2 07/07/2016 1035   ALKPHOS 54 02/07/2022 1230   ALKPHOS 58 07/07/2016 1035   AST 17 02/07/2022 1230   AST 17 10/01/2021 1320   AST 14 07/07/2016 1035   ALT 19 02/07/2022 1230   ALT 17  10/01/2021 1320   ALT 17 07/07/2016 1035   BILITOT 1.2 02/07/2022 1230   BILITOT 1.4 (H) 10/01/2021 1320   BILITOT 0.61 07/07/2016 1035       RADIOGRAPHIC STUDIES: I have reviewed multiple CT imaging with the patient and her daughter I have personally reviewed the radiological images as listed and agreed with the findings in the report.

## 2022-07-01 ENCOUNTER — Ambulatory Visit: Payer: Medicare Other | Admitting: Adult Health

## 2022-07-02 DIAGNOSIS — F339 Major depressive disorder, recurrent, unspecified: Secondary | ICD-10-CM | POA: Diagnosis not present

## 2022-07-02 DIAGNOSIS — E039 Hypothyroidism, unspecified: Secondary | ICD-10-CM | POA: Diagnosis not present

## 2022-07-02 DIAGNOSIS — E785 Hyperlipidemia, unspecified: Secondary | ICD-10-CM | POA: Diagnosis not present

## 2022-07-02 DIAGNOSIS — E559 Vitamin D deficiency, unspecified: Secondary | ICD-10-CM | POA: Diagnosis not present

## 2022-07-02 NOTE — Progress Notes (Deleted)
No chief complaint on file.     ASSESSMENT AND PLAN  Darlene [name] [name] is a 88 y.o. female Dementia  MoCA examination 18/30  CT head of the brain showed moderate atrophy, small vessel disease Gait abnormality  Multifactorial, aging, deconditioning, obesity, right foot deformity, significant right metatarsal pain, history of compression fracture of T9, with mild thoracic canal stenosis, last MRI was in September 2021,  Multilevel lumbar degenerative changes, with moderate L3-4 stenosis, multilevel severe bilateral L3-4, left L5-S1, foraminal canal stenosis  Discussed with patient and her daughter, both want complete evaluation for them to better understanding of her progressive memory loss, gait abnormalities, worsening low back pain,  MRI brain moderate atrophy and moderate small vessel disease  MRI thoracic chronic compression fracture of T9, no significant change compared to previous scan in 01/2020  Laboratory evaluation 12/2021: TSH 0.468, CK1 100, RPR nonreactive  Refer home home health  Return to clinic with nurse practitioner in 6 months  DIAGNOSTIC DATA (LABS, IMAGING, TESTING) - I reviewed patient records, labs, notes, testing and imaging myself where available.     MEDICAL HISTORY:  Update 07/06/2022 JM: Returns for 39-monthfollow-up visit.    MRI brain showed moderate atrophy and moderate small vessel disease. MRI thoracic spine showed       Consult visit 12/29/2021 Dr. YKrista Blue [name] a 88year old female, accompanied by her daughter, seen in request by her primary care nurse practitioner [facility] for evaluation of memory loss, gait abnormality, frequent fall,  I reviewed and summarized the referring note.PMHX. HLD Hypothyrodism HTN Bladder cancer,  Breast cancer, s/p right lobectomy chemo-radiation therapy Non-Hodgkin's Lymphoma   Urinary urgency. Right wrist,  Right knee replacement  Patient lives alone at home since her  husband passed away in 203/14/15 she lives with her dog, still walk her dog half mile each day, but she has gradually declined functional status, gait abnormality, dependent of her walker since 203/14/2020 She suffered few fall accident, most recent fall was in June 2023, was treated at emergency room  I personally reviewed CT head without contrast no acute abnormality, moderate atrophy small vessel disease  Laboratory evaluation showed  CBC hemoglobin of 12.6, with mildly decreased platelet 106, CMP calcium was mildly elevated 10.4, normal kidney function creatinine 0.7, normal ferritin, B12, ESR,  She retired from [employer], art noticed gradual onset of memory loss over the past few years, still drives short distance, but needing her neighbor to help writing her check over the past few years, daughter also noticed that she make mistake on her financial affairs sometimes, MoCA examination is only 160out of 30 today,  She has significant right foot pain, right foot deformity, had right toe surgery in the past, especially since her recent 4 in June, she complains of significant right foot pain while bearing weight,  She also had a history of chronic low back pain, personally reviewed MRI of lumbar spine in August 2021, multilevel degenerative changes, moderate canal stenosis at L3-4, mild L2-3, multilevel foraminal stenosis, severe bilaterally L3-4, left L5-S1  MRI of thoracic spine September 2021, T9 vertebral compression fracture, retropulsion, mild canal stenosis   PHYSICAL EXAM:   There were no vitals filed for this visit.  Not recorded     There is no height or weight on file to calculate BMI.  PHYSICAL EXAMNIATION:  Gen: NAD, conversant, well nourised, well groomed  Cardiovascular: Regular rate rhythm, no peripheral edema, warm, nontender. Eyes: Conjunctivae clear without exudates or hemorrhage Neck: Supple, no carotid bruits. Pulmonary: Clear to auscultation  bilaterally   NEUROLOGICAL EXAM:  MENTAL STATUS: Speech/cognition: Awake, alert, oriented to history taking and casual conversation    12/29/2021    3:00 PM  Montreal Cognitive Assessment   Visuospatial/ Executive (0/5) 2  Naming (0/3) 3  Attention: Read list of digits (0/2) 2  Attention: Read list of letters (0/1) 1  Attention: Serial 7 subtraction starting at 100 (0/3) 0  Language: Repeat phrase (0/2) 2  Language : Fluency (0/1) 0  Abstraction (0/2) 2  Delayed Recall (0/5) 0  Orientation (0/6) 6  Total 18    CRANIAL NERVES: CN II: Visual fields are full to confrontation. Pupils are round equal and briskly reactive to light. CN III, IV, VI: extraocular movement are normal. No ptosis. CN V: Facial sensation is intact to light touch CN VII: Face is symmetric with normal eye closure  CN VIII: Hearing is normal to causal conversation. CN IX, X: Phonation is normal. CN XI: Head turning and shoulder shrug are intact  MOTOR: She has significant deformity of right foot, right toes, significant tenderness upon deep palpitation of right toes, especially metatarsal joints,  No significant upper or lower extremity muscle weakness, lower extremity pitting edema to mid shin level  REFLEXES: Reflexes are 1 and symmetric at the biceps, triceps, knees, absent at ankles. Plantar responses are flexor.  SENSORY: Decreased light touch, pinprick, vibratory sensation to ankle level,  COORDINATION: There is no trunk or limb dysmetria noted.  GAIT/STANCE: Need push-up to get up from sitting position, rely on her walker, unsteady dragging right leg more  REVIEW OF SYSTEMS:  Full 14 system review of systems performed and notable only for as above All other review of systems were negative.   ALLERGIES: Allergies  Allergen Reactions   Oxycodone Other (See Comments)    Made her depressed   Oxycodone Hcl     Other reaction(s): depressed feeling   Sulfamethoxazole-Trimethoprim     Other  reaction(s): Unknown   Codeine Itching            Doxycycline Swelling and Other (See Comments)    Burns throat also   Meperidine And Related Itching and Rash    Makes cheeks red also   Sulfa Antibiotics Itching   Sulfacetamide Sodium Itching    HOME MEDICATIONS: Current Outpatient Medications  Medication Sig Dispense Refill   acetaminophen (TYLENOL) 500 MG tablet Take 1 tablet (500 mg total) by mouth every 6 (six) hours as needed for mild pain. 30 tablet 0   amoxicillin (AMOXIL) 500 MG capsule Take 2,000 mg by mouth once as needed (One hour prior to dental appointment).     aspirin 81 MG tablet Take 81 mg by mouth daily.     betamethasone dipropionate 0.05 % cream      cholecalciferol (VITAMIN D) 1000 UNITS tablet Take 1,000 Units by mouth daily.     citalopram (CELEXA) 40 MG tablet Take 40 mg by mouth daily.      Clobetasol Propionate 0.05 % shampoo      Coenzyme Q10 (CO Q 10) 100 MG CAPS See admin instructions.     Cyanocobalamin (VITAMIN B-12 PO) Take 1,000 mcg by mouth daily.     diclofenac Sodium (VOLTAREN) 1 % GEL diclofenac 1 % topical gel     ergocalciferol (VITAMIN D2) 1.25 MG (50000 UT) capsule  esomeprazole (NEXIUM) 40 MG capsule Take 40 mg by mouth 2 (two) times daily before a meal.     famciclovir (FAMVIR) 125 MG tablet      Iron, Ferrous Sulfate, 325 (65 Fe) MG TABS Take 1 tablet by mouth daily.     levothyroxine (SYNTHROID, LEVOTHROID) 75 MCG tablet Take 75 mcg by mouth daily before breakfast.      lovastatin (MEVACOR) 20 MG tablet TAKE 1 TABLET BY MOUTH ONCEDAILY AT BEDTIME     magnesium oxide (MAG-OX) 400 MG tablet Take 400 mg by mouth daily.     metoprolol succinate (TOPROL-XL) 25 MG 24 hr tablet Take 25 mg by mouth daily.      mirabegron ER (MYRBETRIQ) 25 MG TB24 tablet Myrbetriq 25 mg tablet,extended release     Multiple Vitamins-Minerals (MULTIVITAMINS THER. W/MINERALS) TABS Take 1 tablet by mouth daily.     Polyethyl Glycol-Propyl Glycol 0.4-0.3 % SOLN  Apply 2 drops to eye 2 (two) times daily as needed (for dry eyes).     Probiotic Product (PROBIOTIC PO) Take 1 tablet by mouth daily.     traMADol (ULTRAM) 50 MG tablet      No current facility-administered medications for this visit.    PAST MEDICAL HISTORY: Past Medical History:  Diagnosis Date   Anemia    Anxiety    Aortic atherosclerosis (HCC)    Breast cancer, stage 1, estrogen receptor negative ([place]) 07/06/2011   Cancer of breast ([place])    Cataract    Chronic ITP (idiopathic thrombocytopenia) (HCC) 07/06/2011   Constipation    takes Colace daily   COPD (chronic obstructive pulmonary disease) (HCC)    mild   Dementia (HCC)    Depression    "nervous break down age 60"   Fast heart beat    GERD (gastroesophageal reflux disease)    takes Nexium daily   History of colon polyps    History of depression    received shock treatments    History of palpitations    takes Metoprolol daily   History of vertigo    HOH (hard of hearing)    has hearing aids   Hyperlipidemia    takes Lovastatin daily   Hypertension    Hypothyroid 07/06/2011   takes SYnthroid daily   Joint pain    Joint swelling    Lymphoma ([place])    Lymphoma ([place])    B cell    Lymphoma of lymph nodes of head, face, and/or neck ([place]) 07/06/2011   Neuropathy    Nocturia    Osteopenia    Osteoporosis    hx of and take Reclast   Peripheral neuropathy    Personal history of chemotherapy    Personal history of radiation therapy    Right knee DJD    Shortness of breath dyspnea    with exertion   Thyroid disease    Umbilical hernia    Vitamin D deficiency     PAST SURGICAL HISTORY: Past Surgical History:  Procedure Laterality Date   ADENOIDECTOMY     as a child   BREAST LUMPECTOMY Right 1994   BREAST SURGERY Right    BUNIONECTOMY Right    CARDIAC CATHETERIZATION  2014   COLONOSCOPY     FOOT SURGERY Right    FRACTURE SURGERY     wrist right , right knee   INSERTION OF MESH N/A 04/17/2015    Procedure: INSERTION OF MESH;  Surgeon: Ralene Ok, MD;  Location: [facility];  Service: General;  Laterality: N/A;   KNEE SURGERY     LEFT HEART CATHETERIZATION WITH CORONARY ANGIOGRAM N/A 12/05/2012   Procedure: LEFT HEART CATHETERIZATION WITH CORONARY ANGIOGRAM;  Surgeon: [name] Sine, MD;  Location: [facility];  Service: Cardiovascular;  Laterality: N/A;   right  breast surgery     d/t breast cancer   TONSILLECTOMY     as a child   TOTAL KNEE ARTHROPLASTY Right 06/19/2013   Procedure: TOTAL KNEE ARTHROPLASTY;  Surgeon: Lorn Junes, MD;  Location: [facility];  Service: Orthopedics;  Laterality: Right;   TRANSURETHRAL RESECTION OF BLADDER TUMOR WITH GYRUS (TURBT-GYRUS)     01-26-18 Dr. Louis Meckel   TRANSURETHRAL RESECTION OF BLADDER TUMOR WITH MITOMYCIN-C Bilateral 01/26/2018   Procedure: TRANSURETHRAL RESECTION OF BLADDER TUMOR WITH GEMCITABIN BLLATERAL RETROGRADE PYELOGRAM;  Surgeon: Ardis Hughs, MD;  Location: [facility];  Service: Urology;  Laterality: Bilateral;   TURBINATE REDUCTION     UMBILICAL HERNIA REPAIR N/A 04/17/2015   Procedure: LAPAROSCOPIC UMBILICAL HERNIA REPAIR WITH MESH;  Surgeon: Ralene Ok, MD;  Location: [facility];  Service: General;  Laterality: N/A;   UPPER GASTROINTESTINAL ENDOSCOPY      FAMILY HISTORY: Family History  Problem Relation Age of Onset   Other Mother        C Diff   Hyperlipidemia Father    Heart disease Father    Heart disease Sister    Cancer Brother        Agent orange   Heart disease Maternal Grandfather    Diabetes Son    Mood Disorder Daughter    Hypertension Daughter    Colon cancer Neg Hx    Breast cancer Neg Hx     SOCIAL HISTORY: Social History   Socioeconomic History   Marital status: Widowed    Spouse name: Not on file   Number of children: 2   Years of education: HS   Highest education level: Not on file  Occupational History   Occupation: Retired  Tobacco Use   Smoking status: Former    Packs/day: 0.30    Types:  Cigarettes    Quit date: 1985    Years since quitting: 39.1   Smokeless tobacco: Never   Tobacco comments:    quit smoking 1985  was a casual smoker  Vaping Use   Vaping Use: Never used  Substance and Sexual Activity   Alcohol use: No    Alcohol/week: 0.0 standard drinks of alcohol   Drug use: No   Sexual activity: Not Currently    Birth control/protection: Post-menopausal  Other Topics Concern   Not on file  Social History Narrative   Lives at home alone.   Right-handed.   4 cups of caffeine daily.   Social Determinants of Health   Financial Resource Strain: Not on file  Food Insecurity: Not on file  Transportation Needs: Not on file  Physical Activity: Not on file  Stress: Not on file  Social Connections: Not on file  Intimate Partner Violence: Not on file      I spent *** minutes of face-to-face and non-face-to-face time with patient.  This included previsit chart review, lab review, study review, order entry, electronic health record documentation, patient education  Frann Rider, [facility]  [facility] [address] [place], [place] [address]  Phone [phone] Fax [phone] Note: This document was prepared with digital dictation and possible smart phrase technology. Any transcriptional errors that result from this process are unintentional.

## 2022-07-03 DIAGNOSIS — Z124 Encounter for screening for malignant neoplasm of cervix: Secondary | ICD-10-CM | POA: Diagnosis not present

## 2022-07-03 DIAGNOSIS — Z01419 Encounter for gynecological examination (general) (routine) without abnormal findings: Secondary | ICD-10-CM | POA: Diagnosis not present

## 2022-07-03 DIAGNOSIS — Z6835 Body mass index (BMI) 35.0-35.9, adult: Secondary | ICD-10-CM | POA: Diagnosis not present

## 2022-07-03 DIAGNOSIS — B009 Herpesviral infection, unspecified: Secondary | ICD-10-CM | POA: Diagnosis not present

## 2022-07-03 DIAGNOSIS — Z01411 Encounter for gynecological examination (general) (routine) with abnormal findings: Secondary | ICD-10-CM | POA: Diagnosis not present

## 2022-07-03 DIAGNOSIS — Z779 Other contact with and (suspected) exposures hazardous to health: Secondary | ICD-10-CM | POA: Diagnosis not present

## 2022-07-03 DIAGNOSIS — B372 Candidiasis of skin and nail: Secondary | ICD-10-CM | POA: Diagnosis not present

## 2022-07-03 DIAGNOSIS — Z780 Asymptomatic menopausal state: Secondary | ICD-10-CM | POA: Diagnosis not present

## 2022-07-06 ENCOUNTER — Encounter: Payer: Self-pay | Admitting: Adult Health

## 2022-07-06 ENCOUNTER — Ambulatory Visit: Payer: Medicare Other | Admitting: Adult Health

## 2022-07-13 ENCOUNTER — Ambulatory Visit: Payer: Medicare Other | Admitting: Adult Health

## 2022-07-13 NOTE — Progress Notes (Unsigned)
No chief complaint on file.     ASSESSMENT AND PLAN  Darlene [name] is a 88 y.o. female Dementia  MoCA examination 18/30  CT head of the brain showed moderate atrophy, small vessel disease Gait abnormality  Multifactorial, aging, deconditioning, obesity, right foot deformity, significant right metatarsal pain, history of compression fracture of T9, with mild thoracic canal stenosis, last MRI was in September 2021,  Multilevel lumbar degenerative changes, with moderate L3-4 stenosis, multilevel severe bilateral L3-4, left L5-S1, foraminal canal stenosis  Discussed with patient and her daughter, both want complete evaluation for them to better understanding of her progressive memory loss, gait abnormalities, worsening low back pain,  MRI brain and MRI thoracic spine imaging noted below  Laboratory evaluation 12/2021: TSH 0.468, CK1 100, RPR nonreactive  Refer home home health    Return to clinic with nurse practitioner in 6 months    DIAGNOSTIC DATA (LABS, IMAGING, TESTING) - I reviewed patient records, labs, notes, testing and imaging myself where available.  MRI brain 01/13/2022 -Moderate atrophy / ventriculomegaly and mild to moderate chronic small vessel ischemic disease. -No acute findings.  MRI thoracic spine 01/13/2022 -Chronic compression fracture of T9 vertebral body with greater than 90% loss of vertebral body height.  Retropulsion measuring 5 mm and resulting in mild contact ventral spinal cord. -No significant change from 01/20/20.   MEDICAL HISTORY:  Update 07/13/2022 JM: Returns for 16-monthfollow-up visit.         Consult visit 12/29/2021 Dr. YKrista Blue Darlene KOUis a 88year old female, accompanied by her daughter, seen in request by her primary care nurse practitioner [facility] for evaluation of memory loss, gait abnormality, frequent fall,  I reviewed and summarized the referring note.PMHX. HLD Hypothyrodism HTN Bladder cancer,   Breast cancer, s/p right lobectomy chemo-radiation therapy Non-Hodgkin's Lymphoma   Urinary urgency. Right wrist,  Right knee replacement  Patient lives alone at home since her husband passed away in 208-Apr-2015 she lives with her dog, still walk her dog half mile each day, but she has gradually declined functional status, gait abnormality, dependent of her walker since 2April 08, 2020 She suffered few fall accident, most recent fall was in June 2023, was treated at emergency room  I personally reviewed CT head without contrast no acute abnormality, moderate atrophy small vessel disease  Laboratory evaluation showed  CBC hemoglobin of 12.6, with mildly decreased platelet 106, CMP calcium was mildly elevated 10.4, normal kidney function creatinine 0.7, normal ferritin, B12, ESR,  She retired from [employer], art noticed gradual onset of memory loss over the past few years, still drives short distance, but needing her neighbor to help writing her check over the past few years, daughter also noticed that she make mistake on her financial affairs sometimes, MoCA examination is only 162out of 30 today,  She has significant right foot pain, right foot deformity, had right toe surgery in the past, especially since her recent 4 in June, she complains of significant right foot pain while bearing weight,  She also had a history of chronic low back pain, personally reviewed MRI of lumbar spine in August 2021, multilevel degenerative changes, moderate canal stenosis at L3-4, mild L2-3, multilevel foraminal stenosis, severe bilaterally L3-4, left L5-S1  MRI of thoracic spine September 2021, T9 vertebral compression fracture, retropulsion, mild canal stenosis   PHYSICAL EXAM:   There were no vitals filed for this visit.  Not recorded     There is no height or weight on file  to calculate BMI.  PHYSICAL EXAMNIATION:  Gen: NAD, conversant, well nourised, well groomed                     Cardiovascular:  Regular rate rhythm, no peripheral edema, warm, nontender. Eyes: Conjunctivae clear without exudates or hemorrhage Neck: Supple, no carotid bruits. Pulmonary: Clear to auscultation bilaterally   NEUROLOGICAL EXAM:  MENTAL STATUS: Speech/cognition: Awake, alert, oriented to history taking and casual conversation    12/29/2021    3:00 PM  Montreal Cognitive Assessment   Visuospatial/ Executive (0/5) 2  Naming (0/3) 3  Attention: Read list of digits (0/2) 2  Attention: Read list of letters (0/1) 1  Attention: Serial 7 subtraction starting at 100 (0/3) 0  Language: Repeat phrase (0/2) 2  Language : Fluency (0/1) 0  Abstraction (0/2) 2  Delayed Recall (0/5) 0  Orientation (0/6) 6  Total 18    CRANIAL NERVES: CN II: Visual fields are full to confrontation. Pupils are round equal and briskly reactive to light. CN III, IV, VI: extraocular movement are normal. No ptosis. CN V: Facial sensation is intact to light touch CN VII: Face is symmetric with normal eye closure  CN VIII: Hearing is normal to causal conversation. CN IX, X: Phonation is normal. CN XI: Head turning and shoulder shrug are intact  MOTOR: She has significant deformity of right foot, right toes, significant tenderness upon deep palpitation of right toes, especially metatarsal joints,  No significant upper or lower extremity muscle weakness, lower extremity pitting edema to mid shin level  REFLEXES: Reflexes are 1 and symmetric at the biceps, triceps, knees, absent at ankles. Plantar responses are flexor.  SENSORY: Decreased light touch, pinprick, vibratory sensation to ankle level,  COORDINATION: There is no trunk or limb dysmetria noted.  GAIT/STANCE: Need push-up to get up from sitting position, rely on her walker, unsteady dragging right leg more  REVIEW OF SYSTEMS:  Full 14 system review of systems performed and notable only for as above All other review of systems were negative.   ALLERGIES: Allergies   Allergen Reactions   Oxycodone Other (See Comments)    Made her depressed   Oxycodone Hcl     Other reaction(s): depressed feeling   Sulfamethoxazole-Trimethoprim     Other reaction(s): Unknown   Codeine Itching            Doxycycline Swelling and Other (See Comments)    Burns throat also   Meperidine And Related Itching and Rash    Makes cheeks red also   Sulfa Antibiotics Itching   Sulfacetamide Sodium Itching    HOME MEDICATIONS: Current Outpatient Medications  Medication Sig Dispense Refill   acetaminophen (TYLENOL) 500 MG tablet Take 1 tablet (500 mg total) by mouth every 6 (six) hours as needed for mild pain. 30 tablet 0   amoxicillin (AMOXIL) 500 MG capsule Take 2,000 mg by mouth once as needed (One hour prior to dental appointment).     aspirin 81 MG tablet Take 81 mg by mouth daily.     betamethasone dipropionate 0.05 % cream      cholecalciferol (VITAMIN D) 1000 UNITS tablet Take 1,000 Units by mouth daily.     citalopram (CELEXA) 40 MG tablet Take 40 mg by mouth daily.      Clobetasol Propionate 0.05 % shampoo      Coenzyme Q10 (CO Q 10) 100 MG CAPS See admin instructions.     Cyanocobalamin (VITAMIN B-12 PO) Take 1,000 mcg by  mouth daily.     diclofenac Sodium (VOLTAREN) 1 % GEL diclofenac 1 % topical gel     ergocalciferol (VITAMIN D2) 1.25 MG (50000 UT) capsule      esomeprazole (NEXIUM) 40 MG capsule Take 40 mg by mouth 2 (two) times daily before a meal.     famciclovir (FAMVIR) 125 MG tablet      Iron, Ferrous Sulfate, 325 (65 Fe) MG TABS Take 1 tablet by mouth daily.     levothyroxine (SYNTHROID, LEVOTHROID) 75 MCG tablet Take 75 mcg by mouth daily before breakfast.      lovastatin (MEVACOR) 20 MG tablet TAKE 1 TABLET BY MOUTH ONCEDAILY AT BEDTIME     magnesium oxide (MAG-OX) 400 MG tablet Take 400 mg by mouth daily.     metoprolol succinate (TOPROL-XL) 25 MG 24 hr tablet Take 25 mg by mouth daily.      mirabegron ER (MYRBETRIQ) 25 MG TB24 tablet Myrbetriq  25 mg tablet,extended release     Multiple Vitamins-Minerals (MULTIVITAMINS THER. W/MINERALS) TABS Take 1 tablet by mouth daily.     Polyethyl Glycol-Propyl Glycol 0.4-0.3 % SOLN Apply 2 drops to eye 2 (two) times daily as needed (for dry eyes).     Probiotic Product (PROBIOTIC PO) Take 1 tablet by mouth daily.     traMADol (ULTRAM) 50 MG tablet      No current facility-administered medications for this visit.    PAST MEDICAL HISTORY: Past Medical History:  Diagnosis Date   Anemia    Anxiety    Aortic atherosclerosis (HCC)    Breast cancer, stage 1, estrogen receptor negative ([place]) 07/06/2011   Cancer of breast (HCC)    Cataract    Chronic ITP (idiopathic thrombocytopenia) (HCC) 07/06/2011   Constipation    takes Colace daily   COPD (chronic obstructive pulmonary disease) (HCC)    mild   Dementia (HCC)    Depression    "nervous break down age 89"   Fast heart beat    GERD (gastroesophageal reflux disease)    takes Nexium daily   History of colon polyps    History of depression    received shock treatments    History of palpitations    takes Metoprolol daily   History of vertigo    HOH (hard of hearing)    has hearing aids   Hyperlipidemia    takes Lovastatin daily   Hypertension    Hypothyroid 07/06/2011   takes SYnthroid daily   Joint pain    Joint swelling    Lymphoma ([place])    Lymphoma ([place])    B cell    Lymphoma of lymph nodes of head, face, and/or neck ([place]) 07/06/2011   Neuropathy    Nocturia    Osteopenia    Osteoporosis    hx of and take Reclast   Peripheral neuropathy    Personal history of chemotherapy    Personal history of radiation therapy    Right knee DJD    Shortness of breath dyspnea    with exertion   Thyroid disease    Umbilical hernia    Vitamin D deficiency     PAST SURGICAL HISTORY: Past Surgical History:  Procedure Laterality Date   ADENOIDECTOMY     as a child   BREAST LUMPECTOMY Right 1994   BREAST SURGERY Right     BUNIONECTOMY Right    CARDIAC CATHETERIZATION  2014   COLONOSCOPY     FOOT SURGERY Right    FRACTURE SURGERY  wrist right , right knee   INSERTION OF MESH N/A 04/17/2015   Procedure: INSERTION OF MESH;  Surgeon: Ralene Ok, MD;  Location: [facility];  Service: General;  Laterality: N/A;   KNEE SURGERY     LEFT HEART CATHETERIZATION WITH CORONARY ANGIOGRAM N/A 12/05/2012   Procedure: LEFT HEART CATHETERIZATION WITH CORONARY ANGIOGRAM;  Surgeon: [name] Sine, MD;  [facility] CATH [facility];  Service: Cardiovascular;  Laterality: N/A;   right  breast surgery     d/t breast cancer   TONSILLECTOMY     as a child   TOTAL KNEE ARTHROPLASTY Right 06/19/2013   Procedure: TOTAL KNEE ARTHROPLASTY;  Surgeon: Lorn Junes, MD;  Location: [facility];  Service: Orthopedics;  Laterality: Right;   TRANSURETHRAL RESECTION OF BLADDER TUMOR WITH GYRUS (TURBT-GYRUS)     01-26-18 Dr. Louis Meckel   TRANSURETHRAL RESECTION OF BLADDER TUMOR WITH MITOMYCIN-C Bilateral 01/26/2018   Procedure: TRANSURETHRAL RESECTION OF BLADDER TUMOR WITH GEMCITABIN BLLATERAL RETROGRADE PYELOGRAM;  Surgeon: Ardis Hughs, MD;  Location: [facility];  Service: Urology;  Laterality: Bilateral;   TURBINATE REDUCTION     UMBILICAL HERNIA REPAIR N/A 04/17/2015   Procedure: LAPAROSCOPIC UMBILICAL HERNIA REPAIR WITH MESH;  Surgeon: Ralene Ok, MD;  Location: [facility];  Service: General;  Laterality: N/A;   UPPER GASTROINTESTINAL ENDOSCOPY      FAMILY HISTORY: Family History  Problem Relation Age of Onset   Other Mother        C Diff   Hyperlipidemia Father    Heart disease Father    Heart disease Sister    Cancer Brother        Agent orange   Heart disease Maternal Grandfather    Diabetes Son    Mood Disorder Daughter    Hypertension Daughter    Colon cancer Neg Hx    Breast cancer Neg Hx     SOCIAL HISTORY: Social History   Socioeconomic History   Marital status: Widowed    Spouse name: Not on file   Number of children: 2    Years of education: HS   Highest education level: Not on file  Occupational History   Occupation: Retired  Tobacco Use   Smoking status: Former    Packs/day: 0.30    Types: Cigarettes    Quit date: 1985    Years since quitting: 39.2   Smokeless tobacco: Never   Tobacco comments:    quit smoking 1985  was a casual smoker  Vaping Use   Vaping Use: Never used  Substance and Sexual Activity   Alcohol use: No    Alcohol/week: 0.0 standard drinks of alcohol   Drug use: No   Sexual activity: Not Currently    Birth control/protection: Post-menopausal  Other Topics Concern   Not on file  Social History Narrative   Lives at home alone.   Right-handed.   4 cups of caffeine daily.   Social Determinants of Health   Financial Resource Strain: Not on file  Food Insecurity: Not on file  Transportation Needs: Not on file  Physical Activity: Not on file  Stress: Not on file  Social Connections: Not on file  Intimate Partner Violence: Not on file      I spent *** minutes of face-to-face and non-face-to-face time with patient.  This included previsit chart review, lab review, study review, order entry, electronic health record documentation, patient education  Frann Rider, [facility]  [facility] [address] [place], [place] [address]  Phone  [phone] Fax [phone] Note: This document was prepared with digital dictation and possible smart phrase technology. Any transcriptional errors that result from this process are unintentional.

## 2022-07-14 ENCOUNTER — Encounter: Payer: Self-pay | Admitting: Adult Health

## 2022-07-14 ENCOUNTER — Ambulatory Visit (INDEPENDENT_AMBULATORY_CARE_PROVIDER_SITE_OTHER): Payer: Medicare Other | Admitting: Adult Health

## 2022-07-14 VITALS — BP 115/64 | HR 70 | Ht 62.0 in | Wt 165.8 lb

## 2022-07-14 DIAGNOSIS — R269 Unspecified abnormalities of gait and mobility: Secondary | ICD-10-CM

## 2022-07-14 DIAGNOSIS — R4189 Other symptoms and signs involving cognitive functions and awareness: Secondary | ICD-10-CM

## 2022-07-14 NOTE — Patient Instructions (Signed)
Your Plan:  No changes today - please ensure you continue to follow with your primary doctor for management of risk factors such as blood pressure, cholesterol and diabetes  Ensure you are doing memory exercises routinely and staying physically active as tolerated    Follow up as needed at this time     Thank you for coming to see [name] at [facility]. I hope we have been able to provide you high quality care today.  You may receive a patient satisfaction survey over the next few weeks. We would appreciate your feedback and comments so that we may continue to improve ourselves and the health of our patients.

## 2022-08-10 DIAGNOSIS — M810 Age-related osteoporosis without current pathological fracture: Secondary | ICD-10-CM | POA: Diagnosis not present

## 2022-08-14 DIAGNOSIS — Z961 Presence of intraocular lens: Secondary | ICD-10-CM | POA: Diagnosis not present

## 2022-08-14 DIAGNOSIS — H52203 Unspecified astigmatism, bilateral: Secondary | ICD-10-CM | POA: Diagnosis not present

## 2022-09-01 ENCOUNTER — Encounter: Payer: Self-pay | Admitting: Podiatry

## 2022-09-01 ENCOUNTER — Ambulatory Visit (INDEPENDENT_AMBULATORY_CARE_PROVIDER_SITE_OTHER): Payer: Medicare Other | Admitting: Podiatry

## 2022-09-01 DIAGNOSIS — M79676 Pain in unspecified toe(s): Secondary | ICD-10-CM

## 2022-09-01 DIAGNOSIS — F339 Major depressive disorder, recurrent, unspecified: Secondary | ICD-10-CM | POA: Diagnosis not present

## 2022-09-01 DIAGNOSIS — E039 Hypothyroidism, unspecified: Secondary | ICD-10-CM | POA: Diagnosis not present

## 2022-09-01 DIAGNOSIS — E785 Hyperlipidemia, unspecified: Secondary | ICD-10-CM | POA: Diagnosis not present

## 2022-09-01 DIAGNOSIS — B351 Tinea unguium: Secondary | ICD-10-CM

## 2022-09-01 DIAGNOSIS — D696 Thrombocytopenia, unspecified: Secondary | ICD-10-CM | POA: Diagnosis not present

## 2022-09-01 DIAGNOSIS — E559 Vitamin D deficiency, unspecified: Secondary | ICD-10-CM | POA: Diagnosis not present

## 2022-09-01 NOTE — Progress Notes (Addendum)
This patient returns to the office for evaluation and treatment of long thick painful nails .  This patient is unable to trim her own nails since the patient cannot reach her feet.  Patient says the nails are painful walking and wearing his shoes.    She returns for preventive foot care services.  General Appearance  Alert, conversant and in no acute stress.  Vascular  Dorsalis pedis and posterior tibial  pulses are weakly  palpable  bilaterally.  Capillary return is within normal limits  bilaterally. Temperature is within normal limits  bilaterally.  Neurologic  Senn-Weinstein monofilament wire test within normal limits  bilaterally. Muscle power within normal limits bilaterally.  Nails Thick disfigured discolored nails with subungual debris  from hallux to fifth toes bilaterally. No evidence of bacterial infection or drainage bilaterally.  Orthopedic  No limitations of motion  feet .  No crepitus or effusions noted.  No bony pathology or digital deformities noted.  Hallux varus 1st MPJ and hallux malleus right foot.   Hammer toes 2,3 right.  Skin  normotropic skin with no porokeratosis noted bilaterally.  No signs of infections or ulcers noted.     Onychomycosis  Pain in toes right foot  Pain in toes left foot  Debridement  of nails  1-5  B/L with a nail nipper.  Nails were then filed using a dremel tool with no incidents.   RTC 3 months    Helane Gunther DPM

## 2022-10-02 DIAGNOSIS — E039 Hypothyroidism, unspecified: Secondary | ICD-10-CM | POA: Diagnosis not present

## 2022-10-02 DIAGNOSIS — F339 Major depressive disorder, recurrent, unspecified: Secondary | ICD-10-CM | POA: Diagnosis not present

## 2022-10-02 DIAGNOSIS — E559 Vitamin D deficiency, unspecified: Secondary | ICD-10-CM | POA: Diagnosis not present

## 2022-10-02 DIAGNOSIS — E785 Hyperlipidemia, unspecified: Secondary | ICD-10-CM | POA: Diagnosis not present

## 2022-10-06 DIAGNOSIS — X58XXXA Exposure to other specified factors, initial encounter: Secondary | ICD-10-CM | POA: Diagnosis not present

## 2022-10-06 DIAGNOSIS — S90812A Abrasion, left foot, initial encounter: Secondary | ICD-10-CM | POA: Diagnosis not present

## 2022-10-06 DIAGNOSIS — Z23 Encounter for immunization: Secondary | ICD-10-CM | POA: Diagnosis not present

## 2022-10-13 ENCOUNTER — Inpatient Hospital Stay: Payer: Medicare Other | Attending: Hematology and Oncology

## 2022-10-13 ENCOUNTER — Other Ambulatory Visit: Payer: Self-pay

## 2022-10-13 ENCOUNTER — Inpatient Hospital Stay (HOSPITAL_BASED_OUTPATIENT_CLINIC_OR_DEPARTMENT_OTHER): Payer: No Typology Code available for payment source | Admitting: Hematology and Oncology

## 2022-10-13 ENCOUNTER — Encounter: Payer: Self-pay | Admitting: Hematology and Oncology

## 2022-10-13 VITALS — BP 120/67 | HR 69 | Resp 18 | Ht 62.0 in | Wt 165.4 lb

## 2022-10-13 DIAGNOSIS — E039 Hypothyroidism, unspecified: Secondary | ICD-10-CM | POA: Diagnosis not present

## 2022-10-13 DIAGNOSIS — R296 Repeated falls: Secondary | ICD-10-CM | POA: Insufficient documentation

## 2022-10-13 DIAGNOSIS — Z881 Allergy status to other antibiotic agents status: Secondary | ICD-10-CM | POA: Insufficient documentation

## 2022-10-13 DIAGNOSIS — D693 Immune thrombocytopenic purpura: Secondary | ICD-10-CM | POA: Diagnosis not present

## 2022-10-13 DIAGNOSIS — S90415S Abrasion, left lesser toe(s), sequela: Secondary | ICD-10-CM | POA: Diagnosis not present

## 2022-10-13 DIAGNOSIS — Z885 Allergy status to narcotic agent status: Secondary | ICD-10-CM | POA: Insufficient documentation

## 2022-10-13 DIAGNOSIS — Z79899 Other long term (current) drug therapy: Secondary | ICD-10-CM | POA: Insufficient documentation

## 2022-10-13 DIAGNOSIS — Z882 Allergy status to sulfonamides status: Secondary | ICD-10-CM | POA: Diagnosis not present

## 2022-10-13 DIAGNOSIS — E7800 Pure hypercholesterolemia, unspecified: Secondary | ICD-10-CM | POA: Diagnosis not present

## 2022-10-13 DIAGNOSIS — I10 Essential (primary) hypertension: Secondary | ICD-10-CM | POA: Diagnosis not present

## 2022-10-13 LAB — CBC WITH DIFFERENTIAL/PLATELET
Abs Immature Granulocytes: 0.03 10*3/uL (ref 0.00–0.07)
Basophils Absolute: 0.1 10*3/uL (ref 0.0–0.1)
Basophils Relative: 1 %
Eosinophils Absolute: 0.2 10*3/uL (ref 0.0–0.5)
Eosinophils Relative: 2 %
HCT: 40.9 % (ref 36.0–46.0)
Hemoglobin: 13.5 g/dL (ref 12.0–15.0)
Immature Granulocytes: 0 %
Lymphocytes Relative: 20 %
Lymphs Abs: 1.5 10*3/uL (ref 0.7–4.0)
MCH: 29.9 pg (ref 26.0–34.0)
MCHC: 33 g/dL (ref 30.0–36.0)
MCV: 90.7 fL (ref 80.0–100.0)
Monocytes Absolute: 0.5 10*3/uL (ref 0.1–1.0)
Monocytes Relative: 7 %
Neutro Abs: 5.4 10*3/uL (ref 1.7–7.7)
Neutrophils Relative %: 70 %
Platelets: 136 10*3/uL — ABNORMAL LOW (ref 150–400)
RBC: 4.51 MIL/uL (ref 3.87–5.11)
RDW: 12.9 % (ref 11.5–15.5)
WBC: 7.7 10*3/uL (ref 4.0–10.5)
nRBC: 0 % (ref 0.0–0.2)

## 2022-10-13 NOTE — Assessment & Plan Note (Signed)
She has stable platelet count Previously she has declined resumption of prednisone. She does not need it now I plan to see her back in 12 months for next follow-up

## 2022-10-13 NOTE — Assessment & Plan Note (Signed)
She has appointment pending to follow-up with primary care doctor I advised the patient to be careful at home and to wear the life-saver alarm at all time

## 2022-10-13 NOTE — Progress Notes (Signed)
[facility] OFFICE PROGRESS NOTE  Patient Care Team: Fatima Sanger, FNP as PCP - General (Internal Medicine)  ASSESSMENT & PLAN:  Chronic ITP (idiopathic thrombocytopenia) (HCC) She has stable platelet count Previously she has declined resumption of prednisone. She does not need it now I plan to see her back in 12 months for next follow-up  Recurrent falls She has appointment pending to follow-up with primary care doctor I advised the patient to be careful at home and to wear the life-saver alarm at all time  No orders of the defined types were placed in this encounter.   All questions were answered. The patient knows to call the clinic with any problems, questions or concerns. The total time spent in the appointment was 20 minutes encounter with patients including review of chart and various tests results, discussions about plan of care and coordination of care plan   Artis Delay, MD 10/13/2022 1:12 PM  INTERVAL HISTORY: Please see below for problem oriented charting. she returns for follow-up with her daughter She fell last weekend, fell from her bed She fractured her denture and scraped her foot The patient denies any recent signs or symptoms of bleeding such as spontaneous epistaxis, hematuria or hematochezia.   REVIEW OF SYSTEMS:   Constitutional: Denies fevers, chills or abnormal weight loss Eyes: Denies blurriness of vision Ears, nose, mouth, throat, and face: Denies mucositis or sore throat Respiratory: Denies cough, dyspnea or wheezes Cardiovascular: Denies palpitation, chest discomfort or lower extremity swelling Gastrointestinal:  Denies nausea, heartburn or change in bowel habits Skin: Denies abnormal skin rashes Lymphatics: Denies new lymphadenopathy or easy bruising Neurological:Denies numbness, tingling or new weaknesses Behavioral/Psych: Mood is stable, no new changes  All other systems were reviewed with the patient and are negative.  I  have reviewed the past medical history, past surgical history, social history and family history with the patient and they are unchanged from previous note.  ALLERGIES:  is allergic to oxycodone, oxycodone hcl, sulfamethoxazole-trimethoprim, codeine, doxycycline, meperidine and related, sulfa antibiotics, and sulfacetamide sodium.  MEDICATIONS:  Current Outpatient Medications  Medication Sig Dispense Refill   acetaminophen (TYLENOL) 500 MG tablet Take 1 tablet (500 mg total) by mouth every 6 (six) hours as needed for mild pain. 30 tablet 0   amoxicillin (AMOXIL) 500 MG capsule Take 2,000 mg by mouth once as needed (One hour prior to dental appointment).     aspirin 81 MG tablet Take 81 mg by mouth daily.     betamethasone dipropionate 0.05 % cream      cholecalciferol (VITAMIN D) 1000 UNITS tablet Take 1,000 Units by mouth daily.     citalopram (CELEXA) 40 MG tablet Take 40 mg by mouth daily.      Clobetasol Propionate 0.05 % shampoo      Coenzyme Q10 (CO Q 10) 100 MG CAPS See admin instructions.     Cyanocobalamin (VITAMIN B-12 PO) Take 1,000 mcg by mouth daily.     diclofenac Sodium (VOLTAREN) 1 % GEL      ergocalciferol (VITAMIN D2) 1.25 MG (50000 UT) capsule      esomeprazole (NEXIUM) 40 MG capsule Take 40 mg by mouth 2 (two) times daily before a meal.     famciclovir (FAMVIR) 125 MG tablet      Iron, Ferrous Sulfate, 325 (65 Fe) MG TABS Take 1 tablet by mouth daily.     levothyroxine (SYNTHROID, LEVOTHROID) 75 MCG tablet Take 75 mcg by mouth daily before breakfast.  lovastatin (MEVACOR) 20 MG tablet TAKE 1 TABLET BY MOUTH ONCEDAILY AT BEDTIME     magnesium oxide (MAG-OX) 400 MG tablet Take 400 mg by mouth daily.     metoprolol succinate (TOPROL-XL) 25 MG 24 hr tablet Take 25 mg by mouth daily.      mirabegron ER (MYRBETRIQ) 25 MG TB24 tablet Myrbetriq 25 mg tablet,extended release     Multiple Vitamins-Minerals (MULTIVITAMINS THER. W/MINERALS) TABS Take 1 tablet by mouth daily.      Polyethyl Glycol-Propyl Glycol 0.4-0.3 % SOLN Apply 2 drops to eye 2 (two) times daily as needed (for dry eyes).     Probiotic Product (PROBIOTIC PO) Take 1 tablet by mouth daily.     traMADol (ULTRAM) 50 MG tablet      No current facility-administered medications for this visit.    SUMMARY OF ONCOLOGIC HISTORY:  Darlene [name] was transferred to my care after her prior physician has left.  I reviewed the patient's records extensive and collaborated the history with the patient. 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 She never required any systemic treatment. 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 This has also resolved on its own. She has never developed any additional adenopathy other than the single lymph node palpable in the left supraclavicular fossa.  She did develop a second primary stage I,  0.8 cm,  ER negative cancer of the right breast, treated with lumpectomy, radiation and six cycles of CMF chemotherapy in 1994. No evidence of a new disease from the breast cancer either. Most recent mammogram done in June 2015 showed stable fibroglandular changes in her breasts. She was called back for an ultrasound of the left breast which also came back benign. She had a CT scan of the abdomen done on 11/14/15 because of significant intentional weight loss and abdominal discomfort.  Imaging studies show no evidence of lymphoma.  Moderate stool burden was noted. Repeat CT scan in March 2019 show stable lymphadenopathy with no signs or symptoms of cancer recurrence On 01/26/2018, she underwent transurethral bladder resection of the tumor by urologist Between 2019-2023, she had recurrent ITP relapse On November 11, 2021, she has made informed decision to discontinue  prednisone   PHYSICAL EXAMINATION: ECOG PERFORMANCE STATUS: 1 - Symptomatic but completely ambulatory  Vitals:   10/13/22 1238  BP: 120/67  Pulse: 69  Resp: 18  SpO2: 97%   Filed Weights   10/13/22 1238  Weight: 165 lb 6.4 oz (75 kg)    GENERAL:alert, no distress and comfortable NEURO: alert & oriented x 3 with fluent speech, no focal motor/sensory deficits  LABORATORY DATA:  I have reviewed the data as listed    Component Value Date/Time   NA 138 02/07/2022 1230   NA 135 (L) 07/07/2016 1035   K 4.1 02/07/2022 1230   K 4.8 07/07/2016 1035   CL 108 02/07/2022 1230   CL 106 07/11/2012 0906   CO2 24 02/07/2022 1230   CO2 24 07/07/2016 1035   GLUCOSE 90 02/07/2022 1230   GLUCOSE 85 07/07/2016 1035   GLUCOSE 107 (H) 07/11/2012 0906   BUN 14 02/07/2022 1230   BUN 19.9 07/07/2016 1035   CREATININE 0.71 02/07/2022 1230   CREATININE 0.70 10/01/2021 1320   CREATININE 0.7 07/07/2016 1035   CALCIUM 9.8 02/07/2022 1230  CALCIUM 10.2 07/07/2016 1035   PROT 5.9 (L) 02/07/2022 1230   PROT 6.6 07/07/2016 1035   ALBUMIN 3.6 02/07/2022 1230   ALBUMIN 3.9 07/07/2016 1035   AST 17 02/07/2022 1230   AST 17 10/01/2021 1320   AST 14 07/07/2016 1035   ALT 19 02/07/2022 1230   ALT 17 10/01/2021 1320   ALT 17 07/07/2016 1035   ALKPHOS 54 02/07/2022 1230   ALKPHOS 58 07/07/2016 1035   BILITOT 1.2 02/07/2022 1230   BILITOT 1.4 (H) 10/01/2021 1320   BILITOT 0.61 07/07/2016 1035   GFRNONAA >60 02/07/2022 1230   GFRNONAA >60 10/01/2021 1320   GFRAA >60 05/19/2018 1551    No results found for: "SPEP", "UPEP"  Lab Results  Component Value Date   WBC 7.7 10/13/2022   NEUTROABS 5.4 10/13/2022   HGB 13.5 10/13/2022   HCT 40.9 10/13/2022   MCV 90.7 10/13/2022   PLT 136 (L) 10/13/2022      Chemistry      Component Value Date/Time   NA 138 02/07/2022 1230   NA 135 (L) 07/07/2016 1035   K 4.1 02/07/2022 1230   K 4.8 07/07/2016 1035   CL 108 02/07/2022 1230   CL 106  07/11/2012 0906   CO2 24 02/07/2022 1230   CO2 24 07/07/2016 1035   BUN 14 02/07/2022 1230   BUN 19.9 07/07/2016 1035   CREATININE 0.71 02/07/2022 1230   CREATININE 0.70 10/01/2021 1320   CREATININE 0.7 07/07/2016 1035      Component Value Date/Time   CALCIUM 9.8 02/07/2022 1230   CALCIUM 10.2 07/07/2016 1035   ALKPHOS 54 02/07/2022 1230   ALKPHOS 58 07/07/2016 1035   AST 17 02/07/2022 1230   AST 17 10/01/2021 1320   AST 14 07/07/2016 1035   ALT 19 02/07/2022 1230   ALT 17 10/01/2021 1320   ALT 17 07/07/2016 1035   BILITOT 1.2 02/07/2022 1230   BILITOT 1.4 (H) 10/01/2021 1320   BILITOT 0.61 07/07/2016 1035

## 2022-11-06 DIAGNOSIS — H903 Sensorineural hearing loss, bilateral: Secondary | ICD-10-CM | POA: Diagnosis not present

## 2022-12-01 ENCOUNTER — Ambulatory Visit: Payer: Medicare Other | Admitting: Podiatry

## 2022-12-08 ENCOUNTER — Encounter: Payer: Self-pay | Admitting: Podiatry

## 2022-12-08 ENCOUNTER — Telehealth: Payer: Self-pay

## 2022-12-08 ENCOUNTER — Ambulatory Visit (INDEPENDENT_AMBULATORY_CARE_PROVIDER_SITE_OTHER): Payer: Medicare Other | Admitting: Podiatry

## 2022-12-08 DIAGNOSIS — D696 Thrombocytopenia, unspecified: Secondary | ICD-10-CM | POA: Diagnosis not present

## 2022-12-08 DIAGNOSIS — M2031 Hallux varus (acquired), right foot: Secondary | ICD-10-CM | POA: Diagnosis not present

## 2022-12-08 DIAGNOSIS — B351 Tinea unguium: Secondary | ICD-10-CM | POA: Diagnosis not present

## 2022-12-08 DIAGNOSIS — M79676 Pain in unspecified toe(s): Secondary | ICD-10-CM

## 2022-12-08 NOTE — Telephone Encounter (Signed)
Returned her call and given appt info for appt on 6/10. She verbalized understanding to appt.

## 2022-12-08 NOTE — Progress Notes (Signed)
This patient returns to the office for evaluation and treatment of long thick painful nails .  This patient is unable to trim her own nails since the patient cannot reach her feet.  Patient says the nails are painful walking and wearing his shoes.    She returns for preventive foot care services.  General Appearance  Alert, conversant and in no acute stress.  Vascular  Dorsalis pedis and posterior tibial  pulses are weakly  palpable  bilaterally.  Capillary return is within normal limits  bilaterally. Temperature is within normal limits  bilaterally.  Neurologic  Senn-Weinstein monofilament wire test within normal limits  bilaterally. Muscle power within normal limits bilaterally.  Nails Thick disfigured discolored nails with subungual debris  from hallux to fifth toes bilaterally. No evidence of bacterial infection or drainage bilaterally.  Orthopedic  No limitations of motion  feet .  No crepitus or effusions noted.  No bony pathology or digital deformities noted.  Hallux varus 1st MPJ and hallux malleus right foot.   Hammer toes 2,3 right.  Skin  normotropic skin with no porokeratosis noted bilaterally.  No signs of infections or ulcers noted.     Onychomycosis  Pain in toes right foot  Pain in toes left foot  Debridement  of nails  1-5  B/L with a nail nipper.  Nails were then filed using a dremel tool with no incidents.   RTC 3 months    Helane Gunther DPM

## 2022-12-11 ENCOUNTER — Other Ambulatory Visit: Payer: Self-pay | Admitting: Registered Nurse

## 2022-12-11 ENCOUNTER — Encounter: Payer: Self-pay | Admitting: Internal Medicine

## 2022-12-11 DIAGNOSIS — Z1231 Encounter for screening mammogram for malignant neoplasm of breast: Secondary | ICD-10-CM

## 2023-01-18 ENCOUNTER — Ambulatory Visit: Payer: Medicare Other

## 2023-02-15 DIAGNOSIS — M810 Age-related osteoporosis without current pathological fracture: Secondary | ICD-10-CM | POA: Diagnosis not present

## 2023-03-10 ENCOUNTER — Ambulatory Visit: Payer: Medicare Other

## 2023-03-10 ENCOUNTER — Ambulatory Visit: Payer: Medicare Other | Admitting: Podiatry

## 2023-03-15 DIAGNOSIS — R4189 Other symptoms and signs involving cognitive functions and awareness: Secondary | ICD-10-CM | POA: Diagnosis not present

## 2023-03-15 DIAGNOSIS — Z23 Encounter for immunization: Secondary | ICD-10-CM | POA: Diagnosis not present

## 2023-03-15 DIAGNOSIS — F339 Major depressive disorder, recurrent, unspecified: Secondary | ICD-10-CM | POA: Diagnosis not present

## 2023-03-16 ENCOUNTER — Emergency Department (HOSPITAL_COMMUNITY)
Admission: EM | Admit: 2023-03-16 | Discharge: 2023-03-17 | Disposition: A | Discharge status: Home / Self Care | Payer: Medicare Other | Attending: Emergency Medicine | Admitting: Emergency Medicine

## 2023-03-16 ENCOUNTER — Other Ambulatory Visit: Payer: Self-pay

## 2023-03-16 DIAGNOSIS — M4316 Spondylolisthesis, lumbar region: Secondary | ICD-10-CM | POA: Diagnosis not present

## 2023-03-16 DIAGNOSIS — W19XXXA Unspecified fall, initial encounter: Secondary | ICD-10-CM | POA: Insufficient documentation

## 2023-03-16 DIAGNOSIS — Z7982 Long term (current) use of aspirin: Secondary | ICD-10-CM | POA: Insufficient documentation

## 2023-03-16 DIAGNOSIS — M47816 Spondylosis without myelopathy or radiculopathy, lumbar region: Secondary | ICD-10-CM | POA: Diagnosis not present

## 2023-03-16 DIAGNOSIS — I10 Essential (primary) hypertension: Secondary | ICD-10-CM | POA: Insufficient documentation

## 2023-03-16 DIAGNOSIS — J449 Chronic obstructive pulmonary disease, unspecified: Secondary | ICD-10-CM | POA: Diagnosis not present

## 2023-03-16 DIAGNOSIS — M549 Dorsalgia, unspecified: Secondary | ICD-10-CM | POA: Diagnosis not present

## 2023-03-16 DIAGNOSIS — M4856XA Collapsed vertebra, not elsewhere classified, lumbar region, initial encounter for fracture: Secondary | ICD-10-CM | POA: Diagnosis not present

## 2023-03-16 DIAGNOSIS — Z043 Encounter for examination and observation following other accident: Secondary | ICD-10-CM | POA: Diagnosis not present

## 2023-03-16 DIAGNOSIS — R29890 Loss of height: Secondary | ICD-10-CM | POA: Diagnosis not present

## 2023-03-16 DIAGNOSIS — F0390 Unspecified dementia without behavioral disturbance: Secondary | ICD-10-CM | POA: Diagnosis not present

## 2023-03-16 DIAGNOSIS — R918 Other nonspecific abnormal finding of lung field: Secondary | ICD-10-CM | POA: Diagnosis not present

## 2023-03-16 DIAGNOSIS — M47814 Spondylosis without myelopathy or radiculopathy, thoracic region: Secondary | ICD-10-CM | POA: Diagnosis not present

## 2023-03-16 DIAGNOSIS — M546 Pain in thoracic spine: Secondary | ICD-10-CM | POA: Insufficient documentation

## 2023-03-16 DIAGNOSIS — M545 Low back pain, unspecified: Secondary | ICD-10-CM | POA: Diagnosis not present

## 2023-03-16 DIAGNOSIS — S2242XA Multiple fractures of ribs, left side, initial encounter for closed fracture: Secondary | ICD-10-CM | POA: Diagnosis not present

## 2023-03-16 DIAGNOSIS — Z79899 Other long term (current) drug therapy: Secondary | ICD-10-CM | POA: Diagnosis not present

## 2023-03-16 DIAGNOSIS — M419 Scoliosis, unspecified: Secondary | ICD-10-CM | POA: Diagnosis not present

## 2023-03-16 MED ORDER — IBUPROFEN 400 MG PO TABS
600.0000 mg | ORAL_TABLET | Freq: Once | ORAL | Status: AC
  Administered 2023-03-17: 600 mg via ORAL
  Filled 2023-03-16: qty 1

## 2023-03-16 MED ORDER — LIDOCAINE 5 % EX PTCH
1.0000 | MEDICATED_PATCH | CUTANEOUS | Status: DC
  Administered 2023-03-17: 1 via TRANSDERMAL
  Filled 2023-03-16: qty 1

## 2023-03-16 MED ORDER — METHOCARBAMOL 500 MG PO TABS
500.0000 mg | ORAL_TABLET | Freq: Once | ORAL | Status: AC
  Administered 2023-03-17: 500 mg via ORAL
  Filled 2023-03-16: qty 1

## 2023-03-16 MED ORDER — OXYCODONE-ACETAMINOPHEN 5-325 MG PO TABS
1.0000 | ORAL_TABLET | Freq: Once | ORAL | Status: DC
  Filled 2023-03-16: qty 1

## 2023-03-16 NOTE — ED Provider Notes (Signed)
[place] EMERGENCY DEPARTMENT AT [facility] Provider Note   CSN: [id number] Arrival date & time: 03/16/23  1802     History {Add pertinent medical, surgical, social history, OB history to HPI:1} Chief Complaint  Patient presents with   Back Pain    Darlene [name] is a 88 y.o. female.   Back Pain Patient presents for back pain.  Medical history includes chronic ITP, anxiety, anemia, COPD, dementia, depression, GERD, osteoporosis, HTN.  The past year, she has had issues with balance.  She continues to live independently.  2 days ago, she had a mechanical fall.  After using the bathroom, she fell backwards while standing.  She has since had pain in her mid back.  She went urgent care earlier today where she underwent x-ray imaging.  She was told she has a T9 fracture advised to come to the emergency department.  She has not taken anything for pain today.     Home Medications Prior to Admission medications   Medication Sig Start Date End Date Taking? Authorizing Provider  acetaminophen (TYLENOL) 500 MG tablet Take 1 tablet (500 mg total) by mouth every 6 (six) hours as needed for mild pain. 04/19/16   Tilden Fossa, MD  amoxicillin (AMOXIL) 500 MG capsule Take 2,000 mg by mouth once as needed (One hour prior to dental appointment).    [provider]  aspirin 81 MG tablet Take 81 mg by mouth daily.    [provider]  betamethasone dipropionate 0.05 % cream     [provider]  cholecalciferol (VITAMIN D) 1000 UNITS tablet Take 1,000 Units by mouth daily.    [provider]  citalopram (CELEXA) 40 MG tablet Take 40 mg by mouth daily.     [provider]  Clobetasol Propionate 0.05 % shampoo     [provider]  Coenzyme Q10 (CO Q 10) 100 MG CAPS See admin instructions.    [provider]  Cyanocobalamin (VITAMIN B-12 PO) Take 1,000 mcg by mouth daily.    [provider]  diclofenac Sodium  (VOLTAREN) 1 % GEL     [provider]  ergocalciferol (VITAMIN D2) 1.25 MG (50000 UT) capsule  06/18/18   [provider]  esomeprazole (NEXIUM) 40 MG capsule Take 40 mg by mouth 2 (two) times daily before a meal.    [provider]  famciclovir (FAMVIR) 125 MG tablet  08/15/18   [provider]  Iron, Ferrous Sulfate, 325 (65 Fe) MG TABS Take 1 tablet by mouth daily.    [provider]  levothyroxine (SYNTHROID, LEVOTHROID) 75 MCG tablet Take 75 mcg by mouth daily before breakfast.     [provider]  lovastatin (MEVACOR) 20 MG tablet TAKE 1 TABLET BY MOUTH ONCEDAILY AT BEDTIME    [provider]  magnesium oxide (MAG-OX) 400 MG tablet Take 400 mg by mouth daily.    [provider]  metoprolol succinate (TOPROL-XL) 25 MG 24 hr tablet Take 25 mg by mouth daily.     [provider]  mirabegron ER (MYRBETRIQ) 25 MG TB24 tablet Myrbetriq 25 mg tablet,extended release    [provider]  Multiple Vitamins-Minerals (MULTIVITAMINS THER. W/MINERALS) TABS Take 1 tablet by mouth daily.    [provider]  Polyethyl Glycol-Propyl Glycol 0.4-0.3 % SOLN Apply 2 drops to eye 2 (two) times daily as needed (for dry eyes).    [provider]  Probiotic Product (PROBIOTIC PO) Take 1 tablet by  mouth daily.    [provider]  traMADol (ULTRAM) 50 MG tablet     [provider]      Allergies    Oxycodone, Oxycodone hcl, Sulfamethoxazole-trimethoprim, Codeine, Doxycycline, Meperidine and related, Sulfa antibiotics, and Sulfacetamide sodium    Review of Systems   Review of Systems  Musculoskeletal:  Positive for back pain.  All other systems reviewed and are negative.   Physical Exam Updated Vital Signs BP (!) 144/87 (BP Location: Left Arm)   Pulse 61   Temp 98 F (36.7 C)   Resp 18   SpO2 97%  Physical Exam Vitals and nursing note reviewed.  Constitutional:      General: She  is not in acute distress.    Appearance: Normal appearance. She is well-developed. She is not ill-appearing, toxic-appearing or diaphoretic.  HENT:     Head: Normocephalic and atraumatic.     Right Ear: External ear normal.     Left Ear: External ear normal.     Nose: Nose normal.     Mouth/Throat:     Mouth: Mucous membranes are moist.  Eyes:     Extraocular Movements: Extraocular movements intact.     Conjunctiva/sclera: Conjunctivae normal.  Cardiovascular:     Rate and Rhythm: Normal rate and regular rhythm.  Pulmonary:     Effort: Pulmonary effort is normal. No respiratory distress.  Chest:     Chest wall: No tenderness.  Abdominal:     General: There is no distension.     Palpations: Abdomen is soft.     Tenderness: There is no abdominal tenderness.  Musculoskeletal:        General: Tenderness (Mid back) present. No swelling. Normal range of motion.     Cervical back: Normal range of motion and neck supple.  Skin:    General: Skin is warm and dry.     Capillary Refill: Capillary refill takes less than 2 seconds.     Coloration: Skin is not jaundiced or pale.  Neurological:     General: No focal deficit present.     Mental Status: She is alert and oriented to person, place, and time.     Cranial Nerves: No cranial nerve deficit.     Sensory: No sensory deficit.     Motor: No weakness.     Coordination: Coordination normal.  Psychiatric:        Mood and Affect: Mood normal.        Behavior: Behavior normal.     ED Results / Procedures / Treatments   Labs (all labs ordered are listed, but only abnormal results are displayed) Labs Reviewed - No data to display  EKG None  Radiology No results found.  Procedures Procedures  {Document cardiac monitor, telemetry assessment procedure when appropriate:1}  Medications Ordered in ED Medications - No data to display  ED Course/ Medical Decision Making/ A&P   {   Click here for ABCD2, HEART and other  calculatorsREFRESH Note before signing :1}                              Medical Decision Making  Patient presenting for back pain following a fall that occurred 2 days ago.  She describes the fall as mechanical.  She denies LOC or striking her head.  She was seen urgent care and underwent x-ray imaging which showed concern for a T9 fracture.  Vital signs on arrival are normal.  Patient is overall well-appearing on exam.  She does have tenderness to the area of T9.  Lower extremities are neurovascularly intact.  Multimodal pain control was ordered.  X-ray imaging not available in EMR.  Will obtain CT imaging here.  Patient states that she would not want to go to a rehab facility.  She is aware that she will have worsened mobility issues.  She states that she does have good family support.***.  {Document critical care time when appropriate:1} {Document review of labs and clinical decision tools ie heart score, Chads2Vasc2 etc:1}  {Document your independent review of radiology images, and any outside records:1} {Document your discussion with family members, caretakers, and with consultants:1} {Document social determinants of health affecting pt's care:1} {Document your decision making why or why not admission, treatments were needed:1} Final Clinical Impression(s) / ED Diagnoses Final diagnoses:  None    Rx / DC Orders ED Discharge Orders     None

## 2023-03-16 NOTE — ED Triage Notes (Signed)
Patient arrives for eval of lower back pain after mechanical fall 2 days ago. Denies hitting head or LOC. States she fell one year ago hurting her back and she has had increased falls since then.

## 2023-03-17 ENCOUNTER — Emergency Department (HOSPITAL_COMMUNITY): Payer: Medicare Other

## 2023-03-17 ENCOUNTER — Other Ambulatory Visit (HOSPITAL_COMMUNITY): Payer: Medicare Other

## 2023-03-17 DIAGNOSIS — M47814 Spondylosis without myelopathy or radiculopathy, thoracic region: Secondary | ICD-10-CM | POA: Diagnosis not present

## 2023-03-17 DIAGNOSIS — M4316 Spondylolisthesis, lumbar region: Secondary | ICD-10-CM | POA: Diagnosis not present

## 2023-03-17 DIAGNOSIS — M546 Pain in thoracic spine: Secondary | ICD-10-CM | POA: Diagnosis not present

## 2023-03-17 DIAGNOSIS — M549 Dorsalgia, unspecified: Secondary | ICD-10-CM | POA: Diagnosis not present

## 2023-03-17 DIAGNOSIS — S2242XA Multiple fractures of ribs, left side, initial encounter for closed fracture: Secondary | ICD-10-CM | POA: Diagnosis not present

## 2023-03-17 DIAGNOSIS — R29890 Loss of height: Secondary | ICD-10-CM | POA: Diagnosis not present

## 2023-03-17 DIAGNOSIS — R918 Other nonspecific abnormal finding of lung field: Secondary | ICD-10-CM | POA: Diagnosis not present

## 2023-03-17 LAB — COMPREHENSIVE METABOLIC PANEL
ALT: 24 U/L (ref 0–44)
AST: 15 U/L (ref 15–41)
Albumin: 3.6 g/dL (ref 3.5–5.0)
Alkaline Phosphatase: 49 U/L (ref 38–126)
Anion gap: 10 (ref 5–15)
BUN: 11 mg/dL (ref 8–23)
CO2: 19 mmol/L — ABNORMAL LOW (ref 22–32)
Calcium: 9.6 mg/dL (ref 8.9–10.3)
Chloride: 108 mmol/L (ref 98–111)
Creatinine, Ser: 0.61 mg/dL (ref 0.44–1.00)
GFR, Estimated: >60 mL/min (ref >60)
Glucose, Bld: 92 mg/dL (ref 70–99)
Potassium: 3.6 mmol/L (ref 3.5–5.1)
Sodium: 137 mmol/L (ref 135–145)
Total Bilirubin: 1.2 mg/dL — ABNORMAL HIGH (ref <1.2)
Total Protein: 6.3 g/dL — ABNORMAL LOW (ref 6.5–8.1)

## 2023-03-17 LAB — CBC WITH DIFFERENTIAL/PLATELET
Abs Immature Granulocytes: 0.04 10*3/uL (ref 0.00–0.07)
Basophils Absolute: 0.1 10*3/uL (ref 0.0–0.1)
Basophils Relative: 1 %
Eosinophils Absolute: 0.2 10*3/uL (ref 0.0–0.5)
Eosinophils Relative: 3 %
HCT: 43.2 % (ref 36.0–46.0)
Hemoglobin: 13.6 g/dL (ref 12.0–15.0)
Immature Granulocytes: 1 %
Lymphocytes Relative: 28 %
Lymphs Abs: 2 10*3/uL (ref 0.7–4.0)
MCH: 30.6 pg (ref 26.0–34.0)
MCHC: 31.5 g/dL (ref 30.0–36.0)
MCV: 97.1 fL (ref 80.0–100.0)
Monocytes Absolute: 0.5 10*3/uL (ref 0.1–1.0)
Monocytes Relative: 8 %
Neutro Abs: 4.2 10*3/uL (ref 1.7–7.7)
Neutrophils Relative %: 59 %
Platelets: 122 10*3/uL — ABNORMAL LOW (ref 150–400)
RBC: 4.45 MIL/uL (ref 3.87–5.11)
RDW: 13 % (ref 11.5–15.5)
WBC: 7 10*3/uL (ref 4.0–10.5)
nRBC: 0 % (ref 0.0–0.2)

## 2023-03-17 LAB — MAGNESIUM: Magnesium: 2.2 mg/dL (ref 1.7–2.4)

## 2023-03-17 MED ORDER — LIDOCAINE 5 % EX PTCH: 1.0000 | MEDICATED_PATCH | CUTANEOUS | 0 refills | Status: AC

## 2023-03-17 MED ORDER — AMOXICILLIN-POT CLAVULANATE 875-125 MG PO TABS: 1.0000 | ORAL_TABLET | Freq: Two times a day (BID) | ORAL | 0 refills | Status: AC

## 2023-03-17 MED ORDER — AZITHROMYCIN 250 MG PO TABS: 250.0000 mg | ORAL_TABLET | Freq: Every day | ORAL | 0 refills | Status: DC

## 2023-03-17 MED ORDER — AZITHROMYCIN 250 MG PO TABS
500.0000 mg | ORAL_TABLET | Freq: Once | ORAL | Status: AC
  Administered 2023-03-17: 500 mg via ORAL
  Filled 2023-03-17: qty 2

## 2023-03-17 MED ORDER — METHOCARBAMOL 500 MG PO TABS: 500.0000 mg | ORAL_TABLET | Freq: Three times a day (TID) | ORAL | 0 refills | Status: AC | PRN

## 2023-03-17 MED ORDER — AMOXICILLIN-POT CLAVULANATE 875-125 MG PO TABS
1.0000 | ORAL_TABLET | Freq: Once | ORAL | Status: AC
  Administered 2023-03-17: 1 via ORAL
  Filled 2023-03-17: qty 1

## 2023-03-17 NOTE — Progress Notes (Signed)
Orthopedic Tech Progress Note Patient Details:  Darlene [name] 04-14-1935 [id number]  Ortho Devices Type of Ortho Device: Thoracolumbar corset (TLSO) Ortho Device/Splint Interventions: Ordered, Application, Adjustment   Post Interventions Patient Tolerated: Well Instructions Provided: Care of device, Adjustment of device  [name] 03/17/2023, 3:47 AM

## 2023-03-17 NOTE — ED Notes (Signed)
Patient transported to CT 

## 2023-03-17 NOTE — ED Notes (Signed)
Ortho tech at bedside 

## 2023-03-17 NOTE — Progress Notes (Signed)
Orthopedic Tech Progress Note Patient Details:  Darlene [name] Sep 20, 1934 [id number]  Patient ID: Darlene [name], female   DOB: [birth year], 88 y.o.   MRN: [id number] Called number for RN listed under additional team members and left a message with someone for her to call the ortho techs back on our direct phone number and not the pager number, since the page noted in the previous note was not received. Need clarification on if the patient is being admitted or discharged so the correct TLSO can be given to the patient.  [place] A Jamea Robicheaux 03/17/2023, 2:07 AM

## 2023-03-17 NOTE — Discharge Instructions (Addendum)
Prescriptions for lidocaine patches and muscle relaxer were sent to your pharmacy.  Take as needed.  Lidocaine patches can be switched out daily.  Take over-the-counter ibuprofen and Tylenol as needed.  There is a telephone number below to call for a spine doctor follow-up.  Set up an appointment in 4 to 6 weeks.    Your chest x-ray showed concern for right lower lobe pneumonia.  Prescriptions for antibiotics were sent to your pharmacy.  You received your first doses here in the emergency department.  Continue taking as prescribed.  Return to the emergency department for any new or worsening symptoms of concern.

## 2023-03-17 NOTE — ED Notes (Signed)
Ortho tech paged regarding TLSO brace

## 2023-03-23 DIAGNOSIS — C672 Malignant neoplasm of lateral wall of bladder: Secondary | ICD-10-CM | POA: Diagnosis not present

## 2023-03-29 ENCOUNTER — Emergency Department (HOSPITAL_COMMUNITY)
Admission: EM | Admit: 2023-03-29 | Discharge: 2023-03-29 | Disposition: A | Discharge status: Home / Self Care | Payer: Medicare Other | Attending: Emergency Medicine | Admitting: Emergency Medicine

## 2023-03-29 ENCOUNTER — Ambulatory Visit: Payer: Medicare Other

## 2023-03-29 ENCOUNTER — Emergency Department (HOSPITAL_COMMUNITY): Payer: Medicare Other

## 2023-03-29 DIAGNOSIS — Z7982 Long term (current) use of aspirin: Secondary | ICD-10-CM | POA: Insufficient documentation

## 2023-03-29 DIAGNOSIS — I10 Essential (primary) hypertension: Secondary | ICD-10-CM | POA: Diagnosis not present

## 2023-03-29 DIAGNOSIS — K8050 Calculus of bile duct without cholangitis or cholecystitis without obstruction: Secondary | ICD-10-CM | POA: Diagnosis not present

## 2023-03-29 DIAGNOSIS — R1011 Right upper quadrant pain: Secondary | ICD-10-CM | POA: Insufficient documentation

## 2023-03-29 DIAGNOSIS — J449 Chronic obstructive pulmonary disease, unspecified: Secondary | ICD-10-CM | POA: Insufficient documentation

## 2023-03-29 DIAGNOSIS — K429 Umbilical hernia without obstruction or gangrene: Secondary | ICD-10-CM | POA: Diagnosis not present

## 2023-03-29 DIAGNOSIS — K8020 Calculus of gallbladder without cholecystitis without obstruction: Secondary | ICD-10-CM | POA: Diagnosis not present

## 2023-03-29 DIAGNOSIS — I700 Atherosclerosis of aorta: Secondary | ICD-10-CM | POA: Diagnosis not present

## 2023-03-29 DIAGNOSIS — R109 Unspecified abdominal pain: Secondary | ICD-10-CM | POA: Diagnosis present

## 2023-03-29 DIAGNOSIS — R0902 Hypoxemia: Secondary | ICD-10-CM | POA: Diagnosis not present

## 2023-03-29 DIAGNOSIS — R112 Nausea with vomiting, unspecified: Secondary | ICD-10-CM | POA: Diagnosis not present

## 2023-03-29 DIAGNOSIS — R1111 Vomiting without nausea: Secondary | ICD-10-CM | POA: Diagnosis not present

## 2023-03-29 DIAGNOSIS — R1084 Generalized abdominal pain: Secondary | ICD-10-CM | POA: Diagnosis not present

## 2023-03-29 LAB — CBC WITH DIFFERENTIAL/PLATELET
Abs Immature Granulocytes: 0.04 10*3/uL (ref 0.00–0.07)
Basophils Absolute: 0.1 10*3/uL (ref 0.0–0.1)
Basophils Relative: 1 %
Eosinophils Absolute: 0.1 10*3/uL (ref 0.0–0.5)
Eosinophils Relative: 2 %
HCT: 41.5 % (ref 36.0–46.0)
Hemoglobin: 13.1 g/dL (ref 12.0–15.0)
Immature Granulocytes: 1 %
Lymphocytes Relative: 22 %
Lymphs Abs: 1.8 10*3/uL (ref 0.7–4.0)
MCH: 29.2 pg (ref 26.0–34.0)
MCHC: 31.6 g/dL (ref 30.0–36.0)
MCV: 92.6 fL (ref 80.0–100.0)
Monocytes Absolute: 0.6 10*3/uL (ref 0.1–1.0)
Monocytes Relative: 7 %
Neutro Abs: 5.4 10*3/uL (ref 1.7–7.7)
Neutrophils Relative %: 67 %
Platelets: 186 10*3/uL (ref 150–400)
RBC: 4.48 MIL/uL (ref 3.87–5.11)
RDW: 12.8 % (ref 11.5–15.5)
WBC: 8 10*3/uL (ref 4.0–10.5)
nRBC: 0 % (ref 0.0–0.2)

## 2023-03-29 LAB — COMPREHENSIVE METABOLIC PANEL
ALT: 24 U/L (ref 0–44)
AST: 19 U/L (ref 15–41)
Albumin: 3.8 g/dL (ref 3.5–5.0)
Alkaline Phosphatase: 50 U/L (ref 38–126)
Anion gap: 9 (ref 5–15)
BUN: 8 mg/dL (ref 8–23)
CO2: 25 mmol/L (ref 22–32)
Calcium: 9.9 mg/dL (ref 8.9–10.3)
Chloride: 104 mmol/L (ref 98–111)
Creatinine, Ser: 0.85 mg/dL (ref 0.44–1.00)
GFR, Estimated: >60 mL/min (ref >60)
Glucose, Bld: 102 mg/dL — ABNORMAL HIGH (ref 70–99)
Potassium: 3.6 mmol/L (ref 3.5–5.1)
Sodium: 138 mmol/L (ref 135–145)
Total Bilirubin: 1.3 mg/dL — ABNORMAL HIGH (ref <1.2)
Total Protein: 6.3 g/dL — ABNORMAL LOW (ref 6.5–8.1)

## 2023-03-29 LAB — I-STAT CG4 LACTIC ACID, ED: Lactic Acid, Venous: 1.1 mmol/L (ref 0.5–1.9)

## 2023-03-29 LAB — LIPASE, BLOOD: Lipase: 32 U/L (ref 11–51)

## 2023-03-29 MED ORDER — IOHEXOL 350 MG/ML SOLN
75.0000 mL | Freq: Once | INTRAVENOUS | Status: AC | PRN
  Administered 2023-03-29: 75 mL via INTRAVENOUS

## 2023-03-29 MED ORDER — HYDROCODONE-ACETAMINOPHEN 5-325 MG PO TABS
1.0000 | ORAL_TABLET | Freq: Once | ORAL | Status: AC
  Administered 2023-03-29: 1 via ORAL
  Filled 2023-03-29: qty 1

## 2023-03-29 MED ORDER — ONDANSETRON 4 MG PO TBDP: 4.0000 mg | ORAL_TABLET | Freq: Three times a day (TID) | ORAL | 0 refills | Status: AC | PRN

## 2023-03-29 MED ORDER — OXYCODONE-ACETAMINOPHEN 5-325 MG PO TABS: 1.0000 | ORAL_TABLET | Freq: Four times a day (QID) | ORAL | 0 refills | Status: DC | PRN

## 2023-03-29 NOTE — ED Provider Notes (Signed)
[place] EMERGENCY DEPARTMENT AT [facility] Provider Note   CSN: [id number] Arrival date & time: 03/29/23  1448     History  Chief Complaint  Patient presents with   Abdominal Pain    right    Darlene [name] is a 88 y.o. female.  HPI   88 year old female with medical history significant for HLD, ITP, unstable angina, COPD, GERD, non-Hodgkin lymphoma, cognitive impairment, depression, presenting to the emergency department with right-sided abdominal pain, nausea and vomiting.  The patient states that she had a loose bowel movement yesterday and has since developed generalized and right sided abdominal discomfort with an episode of NB emesis x 2.  She cannot think of any inciting factors.  Symptoms have since eased off.  Home Medications Prior to Admission medications   Medication Sig Start Date End Date Taking? Authorizing Provider  acetaminophen (TYLENOL) 500 MG tablet Take 1 tablet (500 mg total) by mouth every 6 (six) hours as needed for mild pain. 04/19/16   Tilden Fossa, MD  amoxicillin (AMOXIL) 500 MG capsule Take 2,000 mg by mouth once as needed (One hour prior to dental appointment).    [provider]  aspirin 81 MG tablet Take 81 mg by mouth daily.    [provider]  azithromycin (ZITHROMAX) 250 MG tablet Take 1 tablet (250 mg total) by mouth daily. Take 1 every day until finished, starting tomorrow. 03/17/23   Gloris Manchester, MD  betamethasone dipropionate 0.05 % cream     [provider]  cholecalciferol (VITAMIN D) 1000 UNITS tablet Take 1,000 Units by mouth daily.    [provider]  citalopram (CELEXA) 40 MG tablet Take 40 mg by mouth daily.     [provider]  Clobetasol Propionate 0.05 % shampoo     [provider]  Coenzyme Q10 (CO Q 10) 100 MG CAPS See admin instructions.    [provider]  Cyanocobalamin (VITAMIN B-12 PO) Take 1,000 mcg by mouth daily.    [provider]   diclofenac Sodium (VOLTAREN) 1 % GEL     [provider]  ergocalciferol (VITAMIN D2) 1.25 MG (50000 UT) capsule  06/18/18   [provider]  esomeprazole (NEXIUM) 40 MG capsule Take 40 mg by mouth 2 (two) times daily before a meal.    [provider]  famciclovir (FAMVIR) 125 MG tablet  08/15/18   [provider]  Iron, Ferrous Sulfate, 325 (65 Fe) MG TABS Take 1 tablet by mouth daily.    [provider]  levothyroxine (SYNTHROID, LEVOTHROID) 75 MCG tablet Take 75 mcg by mouth daily before breakfast.     [provider]  lidocaine (LIDODERM) 5 % Place 1 patch onto the skin daily. Remove & Discard patch within 12 hours or as directed by MD 03/17/23   Gloris Manchester, MD  lovastatin (MEVACOR) 20 MG tablet TAKE 1 TABLET BY MOUTH ONCEDAILY AT BEDTIME    [provider]  magnesium oxide (MAG-OX) 400 MG tablet Take 400 mg by mouth daily.    [provider]  methocarbamol (ROBAXIN) 500 MG tablet Take 1 tablet (500 mg total) by mouth every 8 (eight) hours as needed for muscle spasms. 03/17/23   Gloris Manchester, MD  metoprolol succinate (TOPROL-XL) 25 MG 24 hr tablet Take 25 mg by mouth daily.     [provider]  mirabegron ER (MYRBETRIQ) 25 MG TB24 tablet Myrbetriq 25 mg tablet,extended release    [provider]  Multiple  Vitamins-Minerals (MULTIVITAMINS THER. W/MINERALS) TABS Take 1 tablet by mouth daily.    [provider]  Polyethyl Glycol-Propyl Glycol 0.4-0.3 % SOLN Apply 2 drops to eye 2 (two) times daily as needed (for dry eyes).    [provider]  Probiotic Product (PROBIOTIC PO) Take 1 tablet by mouth daily.    [provider]  traMADol Janean Sark) 50 MG tablet     [provider]      Allergies    Oxycodone, Oxycodone hcl, Sulfamethoxazole-trimethoprim, Codeine, Doxycycline, Meperidine and related, Sulfa antibiotics, and Sulfacetamide sodium    Review of Systems   Review of  Systems  All other systems reviewed and are negative.   Physical Exam Updated Vital Signs BP 126/77   Pulse 70   Temp 99.2 F (37.3 C) (Oral)   Resp 20   Ht 5\' 2"  (1.575 m)   Wt 75 kg   SpO2 95%   BMI 30.24 kg/m  Physical Exam Vitals and nursing note reviewed.  Constitutional:      General: She is not in acute distress.    Appearance: She is well-developed.  HENT:     Head: Normocephalic and atraumatic.  Eyes:     Conjunctiva/sclera: Conjunctivae normal.  Cardiovascular:     Rate and Rhythm: Normal rate and regular rhythm.     Heart sounds: No murmur heard. Pulmonary:     Effort: Pulmonary effort is normal. No respiratory distress.     Breath sounds: Normal breath sounds.  Abdominal:     Palpations: Abdomen is soft.     Tenderness: There is no abdominal tenderness.  Musculoskeletal:        General: No swelling.     Cervical back: Neck supple.  Skin:    General: Skin is warm and dry.     Capillary Refill: Capillary refill takes less than 2 seconds.  Neurological:     Mental Status: She is alert.  Psychiatric:        Mood and Affect: Mood normal.     ED Results / Procedures / Treatments   Labs (all labs ordered are listed, but only abnormal results are displayed) Labs Reviewed  COMPREHENSIVE METABOLIC PANEL - Abnormal; Notable for the following components:      Result Value   Glucose, Bld 102 (*)    Total Protein 6.3 (*)    Total Bilirubin 1.3 (*)    All other components within normal limits  CBC WITH DIFFERENTIAL/PLATELET  LIPASE, BLOOD  I-STAT CG4 LACTIC ACID, ED    EKG None  Radiology No results found.  Procedures Procedures    Medications Ordered in ED Medications  iohexol (OMNIPAQUE) 350 MG/ML injection 75 mL (75 mLs Intravenous Contrast Given 03/29/23 1813)    ED Course/ Medical Decision Making/ A&P                                 Medical Decision Making Amount and/or Complexity of Data Reviewed Labs: ordered. Radiology:  ordered.  Risk Prescription drug management.      88 year old female with medical history significant for HLD, ITP, unstable angina, COPD, GERD, non-Hodgkin lymphoma, cognitive impairment, depression, presenting to the emergency department with right-sided abdominal pain, nausea and vomiting.  The patient states that she had a loose bowel movement yesterday and has since developed generalized and right sided abdominal discomfort with an episode of NB emesis x 2.  She cannot think of any inciting factors.  Symptoms have since eased off.  On arrival, the patient was afebrile, temperature 99.2, not tachycardic or tachypneic, BP 156/88, saturating 90% on room air.  Physical exam significant for normal abdominal exam, lungs CTAB.  Patient denies any chest pain or shortness of breath.  Presented with an episode of abdominal discomfort in the setting of nausea, vomiting some loose stools.  Considered colitis, considered other acute intra-abdominal emergency, considered mesenteric ischemia.  Symptoms have eased off some and with a reassuring abdominal exam will obtain screening labs, obtain CT angiogram given the intermittent colicky nature of the patient's discomfort as considered mesenteric ischemia is a possibility.  Initial laboratory evaluation significant for lactic acid 1.1, lipase normal, CBC without a leukocytosis or anemia, platelets normal, CMP unremarkable without electrolyte abnormality, normal renal and liver function.  CT angio abdomen pelvis was ordered and pending at time of signout.  Plan at time of signout to follow-up results of CT angio, reassess the patient, plan for likely discharge home if reassuring workup.  Signout given to Dr. Posey Rea at 1700.   Final Clinical Impression(s) / ED Diagnoses Final diagnoses:  None    Rx / DC Orders ED Discharge Orders     None         Ernie Avena, MD 03/29/23 1844

## 2023-03-29 NOTE — ED Provider Notes (Signed)
  Physical Exam  BP 98/67   Pulse 71   Temp 99.2 F (37.3 C) (Oral)   Resp 17   Ht 5\' 2"  (1.575 m)   Wt 75 kg   SpO2 92%   BMI 30.24 kg/m   Physical Exam Vitals and nursing note reviewed.  Constitutional:      General: She is not in acute distress.    Appearance: She is well-developed.  HENT:     Head: Normocephalic and atraumatic.  Eyes:     Conjunctiva/sclera: Conjunctivae normal.  Cardiovascular:     Rate and Rhythm: Normal rate and regular rhythm.     Heart sounds: No murmur heard. Pulmonary:     Effort: Pulmonary effort is normal. No respiratory distress.     Breath sounds: Normal breath sounds.  Abdominal:     Palpations: Abdomen is soft.     Tenderness: There is no abdominal tenderness.  Musculoskeletal:        General: No swelling.     Cervical back: Neck supple.  Skin:    General: Skin is warm and dry.     Capillary Refill: Capillary refill takes less than 2 seconds.  Neurological:     Mental Status: She is alert.  Psychiatric:        Mood and Affect: Mood normal.     Procedures  Procedures  ED Course / MDM    Medical Decision Making Amount and/or Complexity of Data Reviewed Labs: ordered. Radiology: ordered.  Risk Prescription drug management.   Patient received in handoff.  Abdominal pain pending CT angiography.  CT angiography largely unremarkable outside of gallstones and known T10 fracture.  Patient with persistent right upper quadrant pain on my reevaluation that was pain controlled with Norco.  Right requiring ultrasound with gallstones but is otherwise unremarkable.  On reevaluation her pain has resolved and she was able to tolerate p.o. without difficulty and without return of her pain.  Presentation consistent with biliary colic.  Will be discharged with outpatient surgical follow-up.  Return precautions given which she voiced understanding and patient discharged       Glendora Score, MD 03/29/23 2320

## 2023-03-29 NOTE — ED Triage Notes (Signed)
Pt BIB EMS form home for right sided abdominal pain, N/V that worsened today did not improve from tylenol. Pt recently discharged from here after fall with Tylenol for pain management. PT had 2 episodes of emesis with EMS before 4mg  zofran.   EMS VS 154/60 73 HR 121 BG 98 % RA  20 L H

## 2023-03-30 DIAGNOSIS — K8020 Calculus of gallbladder without cholecystitis without obstruction: Secondary | ICD-10-CM | POA: Diagnosis not present

## 2023-03-30 DIAGNOSIS — Z9181 History of falling: Secondary | ICD-10-CM | POA: Diagnosis not present

## 2023-03-30 DIAGNOSIS — S22078G Other fracture of T9-T10 vertebra, subsequent encounter for fracture with delayed healing: Secondary | ICD-10-CM | POA: Diagnosis not present

## 2023-04-04 ENCOUNTER — Other Ambulatory Visit: Payer: Self-pay

## 2023-04-04 ENCOUNTER — Inpatient Hospital Stay (HOSPITAL_COMMUNITY)
Admission: EM | Admit: 2023-04-04 | Discharge: 2023-04-06 | DRG: 556 | Disposition: A | Discharge status: Home Health | Payer: Medicare Other | Attending: Student in an Organized Health Care Education/Training Program | Admitting: Student in an Organized Health Care Education/Training Program

## 2023-04-04 ENCOUNTER — Encounter (HOSPITAL_COMMUNITY): Payer: Self-pay

## 2023-04-04 ENCOUNTER — Emergency Department (HOSPITAL_COMMUNITY): Payer: Medicare Other

## 2023-04-04 DIAGNOSIS — I10 Essential (primary) hypertension: Secondary | ICD-10-CM | POA: Diagnosis present

## 2023-04-04 DIAGNOSIS — R451 Restlessness and agitation: Secondary | ICD-10-CM | POA: Diagnosis not present

## 2023-04-04 DIAGNOSIS — K8070 Calculus of gallbladder and bile duct without cholecystitis without obstruction: Secondary | ICD-10-CM | POA: Diagnosis not present

## 2023-04-04 DIAGNOSIS — E039 Hypothyroidism, unspecified: Secondary | ICD-10-CM | POA: Diagnosis present

## 2023-04-04 DIAGNOSIS — F03911 Unspecified dementia, unspecified severity, with agitation: Secondary | ICD-10-CM | POA: Diagnosis not present

## 2023-04-04 DIAGNOSIS — Z882 Allergy status to sulfonamides status: Secondary | ICD-10-CM | POA: Diagnosis not present

## 2023-04-04 DIAGNOSIS — Z881 Allergy status to other antibiotic agents status: Secondary | ICD-10-CM

## 2023-04-04 DIAGNOSIS — F419 Anxiety disorder, unspecified: Secondary | ICD-10-CM | POA: Diagnosis not present

## 2023-04-04 DIAGNOSIS — Z8249 Family history of ischemic heart disease and other diseases of the circulatory system: Secondary | ICD-10-CM

## 2023-04-04 DIAGNOSIS — Z79890 Hormone replacement therapy (postmenopausal): Secondary | ICD-10-CM | POA: Diagnosis not present

## 2023-04-04 DIAGNOSIS — I700 Atherosclerosis of aorta: Secondary | ICD-10-CM | POA: Diagnosis present

## 2023-04-04 DIAGNOSIS — Z87891 Personal history of nicotine dependence: Secondary | ICD-10-CM

## 2023-04-04 DIAGNOSIS — Z888 Allergy status to other drugs, medicaments and biological substances status: Secondary | ICD-10-CM | POA: Diagnosis not present

## 2023-04-04 DIAGNOSIS — F0394 Unspecified dementia, unspecified severity, with anxiety: Secondary | ICD-10-CM | POA: Diagnosis present

## 2023-04-04 DIAGNOSIS — J449 Chronic obstructive pulmonary disease, unspecified: Secondary | ICD-10-CM | POA: Diagnosis not present

## 2023-04-04 DIAGNOSIS — R9431 Abnormal electrocardiogram [ECG] [EKG]: Secondary | ICD-10-CM | POA: Diagnosis not present

## 2023-04-04 DIAGNOSIS — Z853 Personal history of malignant neoplasm of breast: Secondary | ICD-10-CM | POA: Diagnosis not present

## 2023-04-04 DIAGNOSIS — D696 Thrombocytopenia, unspecified: Secondary | ICD-10-CM | POA: Diagnosis not present

## 2023-04-04 DIAGNOSIS — R1011 Right upper quadrant pain: Secondary | ICD-10-CM | POA: Diagnosis not present

## 2023-04-04 DIAGNOSIS — K219 Gastro-esophageal reflux disease without esophagitis: Secondary | ICD-10-CM | POA: Diagnosis not present

## 2023-04-04 DIAGNOSIS — Z7982 Long term (current) use of aspirin: Secondary | ICD-10-CM

## 2023-04-04 DIAGNOSIS — D693 Immune thrombocytopenic purpura: Secondary | ICD-10-CM | POA: Diagnosis not present

## 2023-04-04 DIAGNOSIS — Z83438 Family history of other disorder of lipoprotein metabolism and other lipidemia: Secondary | ICD-10-CM

## 2023-04-04 DIAGNOSIS — E785 Hyperlipidemia, unspecified: Secondary | ICD-10-CM | POA: Diagnosis present

## 2023-04-04 DIAGNOSIS — F32A Depression, unspecified: Secondary | ICD-10-CM | POA: Diagnosis present

## 2023-04-04 DIAGNOSIS — R109 Unspecified abdominal pain: Secondary | ICD-10-CM | POA: Diagnosis not present

## 2023-04-04 DIAGNOSIS — Z96651 Presence of right artificial knee joint: Secondary | ICD-10-CM | POA: Diagnosis present

## 2023-04-04 DIAGNOSIS — Z923 Personal history of irradiation: Secondary | ICD-10-CM | POA: Diagnosis not present

## 2023-04-04 DIAGNOSIS — Z8659 Personal history of other mental and behavioral disorders: Secondary | ICD-10-CM

## 2023-04-04 DIAGNOSIS — R110 Nausea: Secondary | ICD-10-CM | POA: Diagnosis not present

## 2023-04-04 DIAGNOSIS — Z9221 Personal history of antineoplastic chemotherapy: Secondary | ICD-10-CM | POA: Diagnosis not present

## 2023-04-04 DIAGNOSIS — Z79899 Other long term (current) drug therapy: Secondary | ICD-10-CM

## 2023-04-04 DIAGNOSIS — K8020 Calculus of gallbladder without cholecystitis without obstruction: Secondary | ICD-10-CM | POA: Diagnosis not present

## 2023-04-04 DIAGNOSIS — M791 Myalgia, unspecified site: Secondary | ICD-10-CM | POA: Diagnosis present

## 2023-04-04 DIAGNOSIS — R4189 Other symptoms and signs involving cognitive functions and awareness: Secondary | ICD-10-CM | POA: Diagnosis present

## 2023-04-04 DIAGNOSIS — Z8572 Personal history of non-Hodgkin lymphomas: Secondary | ICD-10-CM

## 2023-04-04 DIAGNOSIS — K8050 Calculus of bile duct without cholangitis or cholecystitis without obstruction: Principal | ICD-10-CM

## 2023-04-04 DIAGNOSIS — Z885 Allergy status to narcotic agent status: Secondary | ICD-10-CM

## 2023-04-04 DIAGNOSIS — Z833 Family history of diabetes mellitus: Secondary | ICD-10-CM

## 2023-04-04 DIAGNOSIS — Z789 Other specified health status: Secondary | ICD-10-CM | POA: Diagnosis not present

## 2023-04-04 LAB — COMPREHENSIVE METABOLIC PANEL
ALT: 16 U/L (ref 0–44)
AST: 16 U/L (ref 15–41)
Albumin: 3.7 g/dL (ref 3.5–5.0)
Alkaline Phosphatase: 48 U/L (ref 38–126)
Anion gap: 11 (ref 5–15)
BUN: 15 mg/dL (ref 8–23)
CO2: 20 mmol/L — ABNORMAL LOW (ref 22–32)
Calcium: 9.7 mg/dL (ref 8.9–10.3)
Chloride: 104 mmol/L (ref 98–111)
Creatinine, Ser: 0.58 mg/dL (ref 0.44–1.00)
GFR, Estimated: >60 mL/min (ref >60)
Glucose, Bld: 107 mg/dL — ABNORMAL HIGH (ref 70–99)
Potassium: 3.4 mmol/L — ABNORMAL LOW (ref 3.5–5.1)
Sodium: 135 mmol/L (ref 135–145)
Total Bilirubin: 1.3 mg/dL — ABNORMAL HIGH (ref <1.2)
Total Protein: 6.1 g/dL — ABNORMAL LOW (ref 6.5–8.1)

## 2023-04-04 LAB — URINALYSIS, W/ REFLEX TO CULTURE (INFECTION SUSPECTED)
Bilirubin Urine: NEGATIVE
Glucose, UA: NEGATIVE mg/dL
Ketones, ur: NEGATIVE mg/dL
Nitrite: NEGATIVE
Protein, ur: NEGATIVE mg/dL
Specific Gravity, Urine: 1.006 (ref 1.005–1.030)
pH: 8 (ref 5.0–8.0)

## 2023-04-04 LAB — CBC WITH DIFFERENTIAL/PLATELET
Abs Immature Granulocytes: 0.04 10*3/uL (ref 0.00–0.07)
Basophils Absolute: 0.1 10*3/uL (ref 0.0–0.1)
Basophils Relative: 1 %
Eosinophils Absolute: 0.1 10*3/uL (ref 0.0–0.5)
Eosinophils Relative: 1 %
HCT: 39.7 % (ref 36.0–46.0)
Hemoglobin: 12.9 g/dL (ref 12.0–15.0)
Immature Granulocytes: 1 %
Lymphocytes Relative: 21 %
Lymphs Abs: 1.8 10*3/uL (ref 0.7–4.0)
MCH: 29.4 pg (ref 26.0–34.0)
MCHC: 32.5 g/dL (ref 30.0–36.0)
MCV: 90.4 fL (ref 80.0–100.0)
Monocytes Absolute: 0.6 10*3/uL (ref 0.1–1.0)
Monocytes Relative: 7 %
Neutro Abs: 6 10*3/uL (ref 1.7–7.7)
Neutrophils Relative %: 69 %
Platelets: 129 10*3/uL — ABNORMAL LOW (ref 150–400)
RBC: 4.39 MIL/uL (ref 3.87–5.11)
RDW: 12.8 % (ref 11.5–15.5)
WBC: 8.5 10*3/uL (ref 4.0–10.5)
nRBC: 0 % (ref 0.0–0.2)

## 2023-04-04 LAB — LIPASE, BLOOD: Lipase: 25 U/L (ref 11–51)

## 2023-04-04 LAB — TSH: TSH: 4.036 u[IU]/mL (ref 0.350–4.500)

## 2023-04-04 MED ORDER — PANTOPRAZOLE SODIUM 40 MG PO TBEC
40.0000 mg | DELAYED_RELEASE_TABLET | Freq: Every day | ORAL | Status: DC
  Administered 2023-04-04 – 2023-04-06 (×2): 40 mg via ORAL
  Filled 2023-04-04 (×2): qty 1

## 2023-04-04 MED ORDER — HALOPERIDOL LACTATE 5 MG/ML IJ SOLN
2.0000 mg | Freq: Once | INTRAMUSCULAR | Status: AC
  Administered 2023-04-04: 2 mg via INTRAVENOUS
  Filled 2023-04-04: qty 1

## 2023-04-04 MED ORDER — SODIUM CHLORIDE 0.9% FLUSH: 3.0000 mL | INTRAVENOUS | Status: DC | PRN

## 2023-04-04 MED ORDER — KETOROLAC TROMETHAMINE 15 MG/ML IJ SOLN
15.0000 mg | Freq: Four times a day (QID) | INTRAMUSCULAR | Status: DC | PRN
  Administered 2023-04-04: 15 mg via INTRAVENOUS
  Filled 2023-04-04: qty 1

## 2023-04-04 MED ORDER — LEVOTHYROXINE SODIUM 75 MCG PO TABS
75.0000 ug | ORAL_TABLET | Freq: Every day | ORAL | Status: DC
  Administered 2023-04-05 – 2023-04-06 (×2): 75 ug via ORAL
  Filled 2023-04-04 (×2): qty 1

## 2023-04-04 MED ORDER — PRAVASTATIN SODIUM 10 MG PO TABS
20.0000 mg | ORAL_TABLET | Freq: Every day | ORAL | Status: DC
  Administered 2023-04-04 – 2023-04-05 (×2): 20 mg via ORAL
  Filled 2023-04-04 (×2): qty 2

## 2023-04-04 MED ORDER — PREGABALIN 50 MG PO CAPS
50.0000 mg | ORAL_CAPSULE | Freq: Two times a day (BID) | ORAL | Status: DC
  Administered 2023-04-04 – 2023-04-06 (×3): 50 mg via ORAL
  Filled 2023-04-04 (×3): qty 1

## 2023-04-04 MED ORDER — ACETAMINOPHEN 500 MG PO TABS
500.0000 mg | ORAL_TABLET | Freq: Four times a day (QID) | ORAL | Status: DC
  Administered 2023-04-04 – 2023-04-06 (×7): 500 mg via ORAL
  Filled 2023-04-04 (×7): qty 1

## 2023-04-04 MED ORDER — LORAZEPAM 2 MG/ML IJ SOLN
0.5000 mg | Freq: Once | INTRAMUSCULAR | Status: AC
  Administered 2023-04-04: 0.5 mg via INTRAVENOUS
  Filled 2023-04-04: qty 1

## 2023-04-04 MED ORDER — CITALOPRAM HYDROBROMIDE 40 MG PO TABS
40.0000 mg | ORAL_TABLET | Freq: Every day | ORAL | Status: DC
  Administered 2023-04-06: 40 mg via ORAL
  Filled 2023-04-04: qty 1

## 2023-04-04 MED ORDER — ACETAMINOPHEN 500 MG PO TABS: 1000.0000 mg | ORAL_TABLET | Freq: Once | ORAL | Status: DC

## 2023-04-04 MED ORDER — HEPARIN SODIUM (PORCINE) 5000 UNIT/ML IJ SOLN
5000.0000 [IU] | Freq: Three times a day (TID) | INTRAMUSCULAR | Status: DC
  Administered 2023-04-04 – 2023-04-06 (×5): 5000 [IU] via SUBCUTANEOUS
  Filled 2023-04-04 (×5): qty 1

## 2023-04-04 MED ORDER — HYDROCODONE-ACETAMINOPHEN 5-325 MG PO TABS
1.0000 | ORAL_TABLET | Freq: Once | ORAL | Status: AC
  Administered 2023-04-04: 1 via ORAL
  Filled 2023-04-04: qty 1

## 2023-04-04 MED ORDER — VALACYCLOVIR HCL 500 MG PO TABS: 1000.0000 mg | ORAL_TABLET | Freq: Every day | ORAL | Status: DC

## 2023-04-04 MED ORDER — ONDANSETRON HCL 4 MG/2ML IJ SOLN
4.0000 mg | Freq: Once | INTRAMUSCULAR | Status: AC
  Administered 2023-04-04: 4 mg via INTRAVENOUS
  Filled 2023-04-04: qty 2

## 2023-04-04 MED ORDER — SODIUM CHLORIDE 0.9 % IV SOLN: 250.0000 mL | INTRAVENOUS | Status: AC | PRN

## 2023-04-04 MED ORDER — SODIUM CHLORIDE 0.9 % IV SOLN
1.0000 g | Freq: Once | INTRAVENOUS | Status: AC
  Administered 2023-04-04: 1 g via INTRAVENOUS
  Filled 2023-04-04: qty 10

## 2023-04-04 MED ORDER — METOPROLOL SUCCINATE ER 25 MG PO TB24
25.0000 mg | ORAL_TABLET | Freq: Every day | ORAL | Status: DC
  Administered 2023-04-04 – 2023-04-06 (×2): 25 mg via ORAL
  Filled 2023-04-04 (×2): qty 1

## 2023-04-04 MED ORDER — SODIUM CHLORIDE 0.9 % IV SOLN
2.0000 g | Freq: Every day | INTRAVENOUS | Status: DC
  Administered 2023-04-05 – 2023-04-06 (×2): 2 g via INTRAVENOUS
  Filled 2023-04-04 (×2): qty 20

## 2023-04-04 MED ORDER — POLYETHYL GLYCOL-PROPYL GLYCOL 0.4-0.3 % OP SOLN: 2.0000 [drp] | Freq: Two times a day (BID) | OPHTHALMIC | Status: DC | PRN

## 2023-04-04 MED ORDER — ONDANSETRON 4 MG PO TBDP: 4.0000 mg | ORAL_TABLET | Freq: Three times a day (TID) | ORAL | Status: DC | PRN

## 2023-04-04 MED ORDER — MAGNESIUM OXIDE -MG SUPPLEMENT 400 (240 MG) MG PO TABS
400.0000 mg | ORAL_TABLET | Freq: Every day | ORAL | Status: DC
  Administered 2023-04-06: 400 mg via ORAL
  Filled 2023-04-04 (×2): qty 1

## 2023-04-04 MED ORDER — SODIUM CHLORIDE 0.9% FLUSH
3.0000 mL | Freq: Two times a day (BID) | INTRAVENOUS | Status: DC
  Administered 2023-04-04 – 2023-04-06 (×3): 3 mL via INTRAVENOUS

## 2023-04-04 MED ORDER — METHOCARBAMOL 500 MG PO TABS
500.0000 mg | ORAL_TABLET | Freq: Three times a day (TID) | ORAL | Status: DC | PRN
  Administered 2023-04-05: 500 mg via ORAL
  Filled 2023-04-04: qty 1

## 2023-04-04 MED ORDER — LIDOCAINE 5 % EX PTCH
1.0000 | MEDICATED_PATCH | CUTANEOUS | Status: DC
  Administered 2023-04-04 – 2023-04-05 (×2): 1 via TRANSDERMAL
  Filled 2023-04-04 (×2): qty 1

## 2023-04-04 MED ORDER — POLYVINYL ALCOHOL 1.4 % OP SOLN: 2.0000 [drp] | OPHTHALMIC | Status: DC | PRN

## 2023-04-04 MED ORDER — MIRABEGRON ER 25 MG PO TB24
25.0000 mg | ORAL_TABLET | Freq: Every day | ORAL | Status: DC
  Administered 2023-04-04 – 2023-04-06 (×2): 25 mg via ORAL
  Filled 2023-04-04 (×3): qty 1

## 2023-04-04 NOTE — ED Notes (Signed)
Son provided this information Next door neighbor Rosary Lively [phone]

## 2023-04-04 NOTE — Subjective & Objective (Addendum)
Caveat: excellent HPI per EDP - reviewed and corroborated with patient. Darlene [name] is a 88 y.o. female with past medical history significant for dementia, breast cancer, hypertension, non-Hodgkin's lymphoma, COPD, hypothyroid, GERD, hyperlipidemia, chronic ITP presents to the ED via EMS complaining of right upper quadrant pain into the right flank.  Patient was seen in the ED on 03/29/2023 for similar symptoms and diagnosed with gallstones and biliary colic.  Patient's pain at that time was well-controlled with oral medicine and she was given outpatient follow-up.  Patient states her pain got worse yesterday after she was eating some candy.  Patient has nausea and dry heaving that started upon ED arrival.  EMS did not give any medications.  Denies constipation, diarrhea, fever.

## 2023-04-04 NOTE — ED Notes (Signed)
Patient leaving the floor in stable condition, with her belongings and staff.

## 2023-04-04 NOTE — ED Notes (Signed)
US at bedside

## 2023-04-04 NOTE — Assessment & Plan Note (Signed)
By record patient with mild cognitive impairment. She reports she lives alone with her dog. Her daughter thinks she is competent to make her own care decisions including consent to surgery

## 2023-04-04 NOTE — Consult Note (Signed)
Reason for Consult:abdominal pain Referring Provider: Melton Alar  Darlene [name] is an 88 y.o. female.  HPI: 88 yo female presents with right sided abdominal pain. She was recently in the ED 1 week ago and is back with the same pain. She vomited once earlier today. Pain is right upper quadrant.  Past Medical History:  Diagnosis Date   Anemia    Anxiety    Aortic atherosclerosis (HCC)    Breast cancer, stage 1, estrogen receptor negative (HCC) 07/06/2011   Cancer of breast (HCC)    Cataract    Chronic ITP (idiopathic thrombocytopenia) (HCC) 07/06/2011   Constipation    takes Colace daily   COPD (chronic obstructive pulmonary disease) (HCC)    mild   Dementia (HCC)    Depression    "nervous break down age 59"   Fast heart beat    GERD (gastroesophageal reflux disease)    takes Nexium daily   History of colon polyps    History of depression    received shock treatments    History of palpitations    takes Metoprolol daily   History of vertigo    HOH (hard of hearing)    has hearing aids   Hyperlipidemia    takes Lovastatin daily   Hypertension    Hypothyroid 07/06/2011   takes SYnthroid daily   Joint pain    Joint swelling    Lymphoma (HCC)    Lymphoma (HCC)    B cell    Lymphoma of lymph nodes of head, face, and/or neck (HCC) 07/06/2011   Neuropathy    Nocturia    Osteopenia    Osteoporosis    hx of and take Reclast   Peripheral neuropathy    Personal history of chemotherapy    Personal history of radiation therapy    Right knee DJD    Shortness of breath dyspnea    with exertion   Thyroid disease    Umbilical hernia    Vitamin D deficiency     Past Surgical History:  Procedure Laterality Date   ADENOIDECTOMY     as a child   BREAST LUMPECTOMY Right 1994   BREAST SURGERY Right    BUNIONECTOMY Right    CARDIAC CATHETERIZATION  2014   COLONOSCOPY     FOOT SURGERY Right    FRACTURE SURGERY     wrist right , right knee   INSERTION OF MESH N/A  04/17/2015   Procedure: INSERTION OF MESH;  Surgeon: Axel Filler, MD;  Location: [facility] OR;  Service: General;  Laterality: N/A;   KNEE SURGERY     LEFT HEART CATHETERIZATION WITH CORONARY ANGIOGRAM N/A 12/05/2012   Procedure: LEFT HEART CATHETERIZATION WITH CORONARY ANGIOGRAM;  Surgeon: Lennette Bihari, MD;  Location: [facility] CATH LAB;  Service: Cardiovascular;  Laterality: N/A;   right  breast surgery     d/t breast cancer   TONSILLECTOMY     as a child   TOTAL KNEE ARTHROPLASTY Right 06/19/2013   Procedure: TOTAL KNEE ARTHROPLASTY;  Surgeon: Nilda Simmer, MD;  Location: [facility] OR;  Service: Orthopedics;  Laterality: Right;   TRANSURETHRAL RESECTION OF BLADDER TUMOR WITH GYRUS (TURBT-GYRUS)     01-26-18 Dr. Marlou Porch   TRANSURETHRAL RESECTION OF BLADDER TUMOR WITH MITOMYCIN-C Bilateral 01/26/2018   Procedure: TRANSURETHRAL RESECTION OF BLADDER TUMOR WITH GEMCITABIN BLLATERAL RETROGRADE PYELOGRAM;  Surgeon: Crist Fat, MD;  Location: [facility] ORS;  Service: Urology;  Laterality: Bilateral;   TURBINATE REDUCTION  UMBILICAL HERNIA REPAIR N/A 04/17/2015   Procedure: LAPAROSCOPIC UMBILICAL HERNIA REPAIR WITH MESH;  Surgeon: Axel Filler, MD;  Location: [facility] OR;  Service: General;  Laterality: N/A;   UPPER GASTROINTESTINAL ENDOSCOPY      Family History  Problem Relation Age of Onset   Other Mother        C Diff   Hyperlipidemia Father    Heart disease Father    Heart disease Sister    Cancer Brother        Agent orange   Heart disease Maternal Grandfather    Diabetes Son    Mood Disorder Daughter    Hypertension Daughter    Colon cancer Neg Hx    Breast cancer Neg Hx     Social History:  reports that she quit smoking about 39 years ago. Her smoking use included cigarettes. She has never used smokeless tobacco. She reports that she does not drink alcohol and does not use drugs.  Allergies:  Allergies  Allergen Reactions   Oxycodone Other (See Comments)    Made her depressed    Oxycodone Hcl     Other reaction(s): depressed feeling   Sulfamethoxazole-Trimethoprim     Other reaction(s): Unknown   Codeine Itching            Doxycycline Swelling and Other (See Comments)    Burns throat also   Meperidine And Related Itching and Rash    Makes cheeks red also   Sulfa Antibiotics Itching   Sulfacetamide Sodium Itching    Medications: I have reviewed the patient's current medications.  Results for orders placed or performed during the hospital encounter of 04/04/23 (from the past 48 hour(s))  Comprehensive metabolic panel     Status: Abnormal   Collection Time: 04/04/23 11:45 AM  Result Value Ref Range   Sodium 135 135 - 145 mmol/L   Potassium 3.4 (L) 3.5 - 5.1 mmol/L   Chloride 104 98 - 111 mmol/L   CO2 20 (L) 22 - 32 mmol/L   Glucose, Bld 107 (H) 70 - 99 mg/dL    Comment: Glucose reference range applies only to samples taken after fasting for at least 8 hours.   BUN 15 8 - 23 mg/dL   Creatinine, Ser 2.13 0.44 - 1.00 mg/dL   Calcium 9.7 8.9 - 08.6 mg/dL   Total Protein 6.1 (L) 6.5 - 8.1 g/dL   Albumin 3.7 3.5 - 5.0 g/dL   AST 16 15 - 41 U/L   ALT 16 0 - 44 U/L   Alkaline Phosphatase 48 38 - 126 U/L   Total Bilirubin 1.3 (H) <1.2 mg/dL   GFR, Estimated >57 >84 mL/min    Comment: (NOTE) Calculated using the CKD-EPI Creatinine Equation (2021)    Anion gap 11 5 - 15    Comment: Performed at [facility], [address] [place], [place] [address]  CBC with Differential     Status: Abnormal   Collection Time: 04/04/23 11:45 AM  Result Value Ref Range   WBC 8.5 4.0 - 10.5 K/uL   RBC 4.39 3.87 - 5.11 MIL/uL   Hemoglobin 12.9 12.0 - 15.0 g/dL   HCT 52.8 41.3 - 24.4 %   MCV 90.4 80.0 - 100.0 fL   MCH 29.4 26.0 - 34.0 pg   MCHC 32.5 30.0 - 36.0 g/dL   RDW 01.0 27.2 - 53.6 %   Platelets 129 (L) 150 - 400 K/uL    Comment: SPECIMEN CHECKED FOR CLOTS REPEATED TO  VERIFY    nRBC 0.0 0.0 - 0.2 %   Neutrophils Relative % 69 %   Neutro Abs 6.0 1.7 -  7.7 K/uL   Lymphocytes Relative 21 %   Lymphs Abs 1.8 0.7 - 4.0 K/uL   Monocytes Relative 7 %   Monocytes Absolute 0.6 0.1 - 1.0 K/uL   Eosinophils Relative 1 %   Eosinophils Absolute 0.1 0.0 - 0.5 K/uL   Basophils Relative 1 %   Basophils Absolute 0.1 0.0 - 0.1 K/uL   Immature Granulocytes 1 %   Abs Immature Granulocytes 0.04 0.00 - 0.07 K/uL    Comment: Performed at [facility], [address], [place], [place] [address]  Lipase, blood     Status: None   Collection Time: 04/04/23 11:45 AM  Result Value Ref Range   Lipase 25 11 - 51 U/L    Comment: Performed at [facility], [address], [place], [place] [address]  Urinalysis, w/ Reflex to Culture (Infection Suspected) -Urine, Clean Catch     Status: Abnormal   Collection Time: 04/04/23  1:29 PM  Result Value Ref Range   Specimen Source URINE, CLEAN CATCH    Color, Urine YELLOW YELLOW   APPearance CLOUDY (A) CLEAR   Specific Gravity, Urine 1.006 1.005 - 1.030   pH 8.0 5.0 - 8.0   Glucose, UA NEGATIVE NEGATIVE mg/dL   Hgb urine dipstick SMALL (A) NEGATIVE   Bilirubin Urine NEGATIVE NEGATIVE   Ketones, ur NEGATIVE NEGATIVE mg/dL   Protein, ur NEGATIVE NEGATIVE mg/dL   Nitrite NEGATIVE NEGATIVE   Leukocytes,Ua MODERATE (A) NEGATIVE   RBC / HPF 6-10 0 - 5 RBC/hpf   WBC, UA 11-20 0 - 5 WBC/hpf    Comment:        Reflex urine culture not performed if WBC <=10, OR if Squamous epithelial cells >5. If Squamous epithelial cells >5 suggest recollection.    Bacteria, UA RARE (A) NONE SEEN   Squamous Epithelial / HPF 21-50 0 - 5 /HPF    Comment: Performed at [facility], [address], [place], [place] [address]    US Abdomen Limited RUQ (LIVER/GB)  Result Date: 04/04/2023 CLINICAL DATA:  Abdominal pain EXAM: ULTRASOUND ABDOMEN LIMITED RIGHT UPPER QUADRANT COMPARISON:  Ultrasound abdomen dated 03/29/2019 FINDINGS: Gallbladder: No wall thickening visualized. Again seen is 5 mm adherent nonshadowing  echogenicity within the gallbladder body, likely a nonshadowing stone. No sonographic Murphy sign noted by sonographer. Common bile duct: Diameter: 4 mm Liver: No focal lesion identified. Within normal limits in parenchymal echogenicity. Portal vein is patent on color Doppler imaging with normal direction of blood flow towards the liver. Other: None. IMPRESSION: Cholelithiasis without acute cholecystitis. Electronically Signed   By: Agustin Cree M.D.   On: 04/04/2023 12:54    Review of Systems  Constitutional: Negative.   HENT: Negative.    Eyes: Negative.   Respiratory: Negative.    Cardiovascular: Negative.   Gastrointestinal:  Positive for abdominal pain, nausea and vomiting.  Genitourinary: Negative.   Musculoskeletal: Negative.   Skin: Negative.   Neurological: Negative.   Endo/Heme/Allergies: Negative.   Psychiatric/Behavioral: Negative.      PE Blood pressure (!) 136/100, pulse 93, temperature 98.3 F (36.8 C), temperature source Oral, resp. rate 18, height 5\' 2"  (1.575 m), weight 74.8 kg, SpO2 94%. Constitutional: NAD; conversant; no deformities Eyes: Moist conjunctiva; no lid lag; anicteric; PERRL Neck: Trachea midline; no thyromegaly Lungs: Normal respiratory effort; no tactile fremitus  CV: RRR; no palpable thrills; no pitting edema GI: Abd soft, TTP RUQ; no palpable hepatosplenomegaly MSK: Normal gait; no clubbing/cyanosis Psychiatric: Appropriate affect; alert and oriented x3 Lymphatic: No palpable cervical or axillary lymphadenopathy Skin: No major subcutaneous nodules. Warm and dry   Assessment/Plan: 88 yo female with biliary colic with stone seen on [name]. LFTs reviewed and normal. -admit to medicine floor -IV abx -plan for gallbladder removal during hospitalization  I reviewed last 24 h vitals and pain scores, last 48 h intake and output, last 24 h labs and trends, and last 24 h imaging results.  This care required high  level of medical decision making.   De Blanch Berthe Oley 04/04/2023, 2:52 PM

## 2023-04-04 NOTE — Assessment & Plan Note (Signed)
BP mildly elevated. Renal function nl.  Plan  Continue home regimen

## 2023-04-04 NOTE — ED Notes (Signed)
Pt anxious, yelling, reporting "I'm upset " , This RN notified attending PA of pt behavior.

## 2023-04-04 NOTE — Assessment & Plan Note (Signed)
Last lipid profile 2017.  Plan Lipid panel in AM  Continue home medication

## 2023-04-04 NOTE — ED Notes (Signed)
Pt continues to be very anxious, confused, attempting to climb out of hospital bed. Bed alarm in place. EDP made aware.

## 2023-04-04 NOTE — ED Notes (Signed)
[name]/Carl Lasley (son/daughter-in-law) [phone] Requesting updates on pt POC

## 2023-04-04 NOTE — ED Notes (Signed)
Hospitalist at bedside 

## 2023-04-04 NOTE — ED Triage Notes (Addendum)
Pt to ED via [facility] from home c/o abdominal pain and right flank pain ongoing problem.  was evaluated at ED 5 days ago and dx with gall stones. Pt reports abdominal pain got worse yesterday after eating for the holidays. Pt c/o nausea that started today.   Last VS 112 CBG, P 95, 138/84, 95%RA   No medications given by EMS.

## 2023-04-04 NOTE — Assessment & Plan Note (Signed)
Last TSH 0.468 1 year ago.  Plan TSH  Continue home regimen

## 2023-04-04 NOTE — ED Provider Notes (Signed)
[place] EMERGENCY DEPARTMENT AT [facility] Provider Note   CSN: [id number] Arrival date & time: 04/04/23  1116     History  Chief Complaint  Patient presents with   Abdominal Pain    Darlene [name] is a 88 y.o. female with past medical history significant for dementia, breast cancer, hypertension, non-Hodgkin's lymphoma, COPD, hypothyroid, GERD, hyperlipidemia, chronic ITP presents to the ED via EMS complaining of right upper quadrant pain into the right flank.  Patient was seen in the ED on 03/29/2023 for similar symptoms and diagnosed with gallstones and biliary colic.  Patient's pain at that time was well-controlled with oral medicine and she was given outpatient follow-up.  Patient states her pain got worse yesterday after she was eating some candy.  Patient has nausea and dry heaving that started upon ED arrival.  EMS did not give any medications.  Denies constipation, diarrhea, fever.         Home Medications Prior to Admission medications   Medication Sig Start Date End Date Taking? Authorizing Provider  acetaminophen (TYLENOL) 500 MG tablet Take 1 tablet (500 mg total) by mouth every 6 (six) hours as needed for mild pain. 04/19/16   Tilden Fossa, MD  amoxicillin (AMOXIL) 500 MG capsule Take 2,000 mg by mouth once as needed (One hour prior to dental appointment).    [provider]  aspirin 81 MG tablet Take 81 mg by mouth daily.    [provider]  azithromycin (ZITHROMAX) 250 MG tablet Take 1 tablet (250 mg total) by mouth daily. Take 1 every day until finished, starting tomorrow. 03/17/23   Gloris Manchester, MD  betamethasone dipropionate 0.05 % cream     [provider]  cholecalciferol (VITAMIN D) 1000 UNITS tablet Take 1,000 Units by mouth daily.    [provider]  citalopram (CELEXA) 40 MG tablet Take 40 mg by mouth daily.     [provider]  Clobetasol Propionate 0.05 % shampoo     [provider]   Coenzyme Q10 (CO Q 10) 100 MG CAPS See admin instructions.    [provider]  Cyanocobalamin (VITAMIN B-12 PO) Take 1,000 mcg by mouth daily.    [provider]  diclofenac Sodium (VOLTAREN) 1 % GEL     [provider]  ergocalciferol (VITAMIN D2) 1.25 MG (50000 UT) capsule  06/18/18   [provider]  esomeprazole (NEXIUM) 40 MG capsule Take 40 mg by mouth 2 (two) times daily before a meal.    [provider]  famciclovir (FAMVIR) 125 MG tablet  08/15/18   [provider]  Iron, Ferrous Sulfate, 325 (65 Fe) MG TABS Take 1 tablet by mouth daily.    [provider]  levothyroxine (SYNTHROID, LEVOTHROID) 75 MCG tablet Take 75 mcg by mouth daily before breakfast.     [provider]  lidocaine (LIDODERM) 5 % Place 1 patch onto the skin daily. Remove & Discard patch within 12 hours or as directed by MD 03/17/23   Gloris Manchester, MD  lovastatin (MEVACOR) 20 MG tablet TAKE 1 TABLET BY MOUTH ONCEDAILY AT BEDTIME    [provider]  magnesium oxide (MAG-OX) 400 MG tablet Take 400 mg by mouth daily.    [provider]  methocarbamol (ROBAXIN) 500 MG tablet Take 1 tablet (500 mg total) by mouth every 8 (eight) hours as needed for muscle spasms. 03/17/23   Gloris Manchester, MD  metoprolol succinate (TOPROL-XL) 25 MG 24 hr tablet Take  25 mg by mouth daily.     [provider]  mirabegron ER (MYRBETRIQ) 25 MG TB24 tablet Myrbetriq 25 mg tablet,extended release    [provider]  Multiple Vitamins-Minerals (MULTIVITAMINS THER. W/MINERALS) TABS Take 1 tablet by mouth daily.    [provider]  ondansetron (ZOFRAN-ODT) 4 MG disintegrating tablet Take 1 tablet (4 mg total) by mouth every 8 (eight) hours as needed for nausea or vomiting. 03/29/23   Kommor, Madison, MD  oxyCODONE-acetaminophen (PERCOCET/ROXICET) 5-325 MG tablet Take 1 tablet by mouth every 6 (six) hours as needed for severe pain (pain score  7-10). 03/29/23   Kommor, Madison, MD  Polyethyl Glycol-Propyl Glycol 0.4-0.3 % SOLN Apply 2 drops to eye 2 (two) times daily as needed (for dry eyes).    [provider]  Probiotic Product (PROBIOTIC PO) Take 1 tablet by mouth daily.    [provider]  traMADol (ULTRAM) 50 MG tablet     [provider]      Allergies    Oxycodone, Oxycodone hcl, Sulfamethoxazole-trimethoprim, Codeine, Doxycycline, Meperidine and related, Sulfa antibiotics, and Sulfacetamide sodium    Review of Systems   Review of Systems  Constitutional:  Negative for fever.  Gastrointestinal:  Positive for abdominal pain, nausea and vomiting. Negative for constipation and diarrhea.    Physical Exam Updated Vital Signs BP (!) 136/100   Pulse 93   Temp 98.3 F (36.8 C) (Oral)   Resp 18   Ht 5\' 2"  (1.575 m)   Wt 74.8 kg   SpO2 94%   BMI 30.18 kg/m  Physical Exam Vitals and nursing note reviewed.  Constitutional:      General: She is not in acute distress.    Appearance: Normal appearance. She is not ill-appearing or diaphoretic.  Cardiovascular:     Rate and Rhythm: Normal rate and regular rhythm.  Pulmonary:     Effort: Pulmonary effort is normal.  Abdominal:     General: Abdomen is flat. Bowel sounds are normal.     Palpations: Abdomen is soft.     Tenderness: There is abdominal tenderness in the right upper quadrant. There is no right CVA tenderness, guarding or rebound. Negative signs include Murphy's sign.     Comments: Mild RUQ and right flank tenderness to palpation.  No epigastric tenderness.  Negative Murphy's sign.   Skin:    General: Skin is warm and dry.     Capillary Refill: Capillary refill takes less than 2 seconds.  Neurological:     Mental Status: She is alert. She is disoriented.     GCS: GCS eye subscore is 4. GCS verbal subscore is 4. GCS motor subscore is 6.  Psychiatric:        Mood and Affect: Mood normal.        Behavior: Behavior normal.     ED  Results / Procedures / Treatments   Labs (all labs ordered are listed, but only abnormal results are displayed) Labs Reviewed  COMPREHENSIVE METABOLIC PANEL - Abnormal; Notable for the following components:      Result Value   Potassium 3.4 (*)    CO2 20 (*)    Glucose, Bld 107 (*)    Total Protein 6.1 (*)    Total Bilirubin 1.3 (*)    All other components within normal limits  CBC WITH DIFFERENTIAL/PLATELET - Abnormal; Notable for the following components:   Platelets 129 (*)    All other components within normal limits  URINALYSIS, W/ REFLEX TO  CULTURE (INFECTION SUSPECTED) - Abnormal; Notable for the following components:   APPearance CLOUDY (*)    Hgb urine dipstick SMALL (*)    Leukocytes,Ua MODERATE (*)    Bacteria, UA RARE (*)    All other components within normal limits  URINE CULTURE  LIPASE, BLOOD    EKG None  Radiology US Abdomen Limited RUQ (LIVER/GB)  Result Date: 04/04/2023 CLINICAL DATA:  Abdominal pain EXAM: ULTRASOUND ABDOMEN LIMITED RIGHT UPPER QUADRANT COMPARISON:  Ultrasound abdomen dated 03/29/2019 FINDINGS: Gallbladder: No wall thickening visualized. Again seen is 5 mm adherent nonshadowing echogenicity within the gallbladder body, likely a nonshadowing stone. No sonographic Murphy sign noted by sonographer. Common bile duct: Diameter: 4 mm Liver: No focal lesion identified. Within normal limits in parenchymal echogenicity. Portal vein is patent on color Doppler imaging with normal direction of blood flow towards the liver. Other: None. IMPRESSION: Cholelithiasis without acute cholecystitis. Electronically Signed   By: Agustin Cree M.D.   On: 04/04/2023 12:54    Procedures Procedures    Medications Ordered in ED Medications  cefTRIAXone (ROCEPHIN) 1 g in sodium chloride 0.9 % 100 mL IVPB (1 g Intravenous New Bag/Given 04/04/23 1408)  ondansetron (ZOFRAN) injection 4 mg (4 mg Intravenous Given 04/04/23 1223)  LORazepam (ATIVAN) injection 0.5 mg (0.5 mg  Intravenous Given 04/04/23 1220)  HYDROcodone-acetaminophen (NORCO/VICODIN) 5-325 MG per tablet 1 tablet (1 tablet Oral Given 04/04/23 1341)  haloperidol lactate (HALDOL) injection 2 mg (2 mg Intravenous Given 04/04/23 1404)    ED Course/ Medical Decision Making/ A&P                                 Medical Decision Making Amount and/or Complexity of Data Reviewed Labs: ordered.  Risk Prescription drug management.   This patient presents to the ED with chief complaint(s) of RUQ and R flank pain with pertinent past medical history of cancer, hyperlipidemia, gallstones.  The complaint involves an extensive differential diagnosis and also carries with it a high risk of complications and morbidity.    The differential diagnosis includes biliary colic, cholecystitis, cholangitis   The initial plan is to obtain labs, RUQ [name]  Additional history obtained: Additional history obtained from EMS  - Spoke with patient's daughter in law and son.  She does have history of dementia and has been having worsening short term memory loss since June.  She has not been taking her medications as prescribed.  Patient's son reports that she is going to have home care services set up starting tomorrow and plan is to have someone with the patient from 12pm-8pm.  Patient typically has someone with her in the hospital to keep her calm.  She does not usually exhibit behaviors such as climbing out of the bed, but this may be due to her not having a familiar person with her. Records reviewed  ED visit and imaging results from 03/29/23  Initial Assessment:   Exam significant for ill-appearing patient who is not in acute distress.  She is dry heaving, but no active vomiting.  Mild tenderness to palpation of the right upper quadrant and right flank.  No epigastric tenderness.  Negative Murphy sign.  Vital signs are stable.  She is afebrile.  Patient does appear anxious and uncomfortable.   Independent ECG/labs  interpretation:  The following labs were independently interpreted:  CBC without leukocytosis or anemia.  There is thrombocytopenia, but this is chronic for patient.  Metabolic  panel without major electrolyte disturbance.  Bilirubin is mildly elevated at 1.3.  Otherwise LFTs are unremarkable.  Renal function is normal. UA with cloudy appearance and moderate leukocytes.  Significant amount of squamous cells also present.    Independent visualization and interpretation of imaging: I independently visualized the following imaging with scope of interpretation limited to determining acute life threatening conditions related to emergency care: RUQ [name], which revealed gallstones but no evidence of acute cholecystitis.   Treatment and Reassessment: Patient is very anxious and nauseous.  Patient given Ativan and Zofran.  She reports that her nausea is completely resolved.  She does continue to complain of RUQ pain.  Patient has several intolerances.  She is tolerating p.o. so we will give her oral pain medicine at this time.  Informed by RN that patient seems very disoriented and is attempting to climb out of the hospital bed.  Bed alarms are in place.  Due to patient being quite restless and anxious without improvement after Ativan, will give 2 mg of Haldol and reassess.  Given patient's confusion, will treat for possible UTI.  Urine has moderate leukocytes and squamous cells but rare bacteria.  Will send for culture.  1 dose of Rocephin was ordered.  Consultations obtained:   Spoke with on-call general surgery provider Dr. Sheliah Hatch who agrees to come evaluate patient determine if she is a good surgical candidate.  He is requesting medicine admission.  Spoke with on-call [facility] hospitalist Dr. Debby Bud who will come see patient for hospital admission.    Disposition:   Patient to be admitted to [facility] with a plan for cholecystectomy tomorrow.           Final Clinical Impression(s) /  ED Diagnoses Final diagnoses:  Recurrent biliary colic  Failure of outpatient treatment  Anxiety with restlessness  History of dementia    Rx / DC Orders ED Discharge Orders     None         Lenard Simmer, PA-C 04/04/23 1417    Durwin Glaze, MD 04/04/23 2032

## 2023-04-04 NOTE — Assessment & Plan Note (Signed)
Patient seen 03/29/23 for biliary colic with cholelithiasis w/o cholecystitis by [facility]. She was able to be discharged from ED. She returns with recurrent RUQ abdominal pain, N/V. No evidence of infection.  Plan Med-surg admit  Ketorolac q 6 for pain control  Clear liquid diet  Lap chole per Dr. Guerry Minors consult during this admission

## 2023-04-04 NOTE — ED Notes (Signed)
Next door neighbor  Rosary Lively [phone]

## 2023-04-04 NOTE — ED Notes (Signed)
Bed alarm in place for safety

## 2023-04-04 NOTE — ED Notes (Signed)
Patient alert to self and knows she lives in [place], [place].

## 2023-04-04 NOTE — H&P (Signed)
History and Physical    Darlene [name]:[id number] DOB: 31-Mar-1935 DOA: 04/04/2023  DOS: the patient was seen and examined on 04/04/2023  PCP: Fatima Sanger, FNP   Patient coming from: Home  I have personally briefly reviewed patient's old medical records in [facility]  Caveat: excellent HPI per EDP - reviewed and corroborated with patient. Darlene [name] is a 88 y.o. female with past medical history significant for dementia, breast cancer, hypertension, non-Hodgkin's lymphoma, COPD, hypothyroid, GERD, hyperlipidemia, chronic ITP presents to the ED via EMS complaining of right upper quadrant pain into the right flank.  Patient was seen in the ED on 03/29/2023 for similar symptoms and diagnosed with gallstones and biliary colic.  Patient's pain at that time was well-controlled with oral medicine and she was given outpatient follow-up.  Patient states her pain got worse yesterday after she was eating some candy.  Patient has nausea and dry heaving that started upon ED arrival.  EMS did not give any medications.  Denies constipation, diarrhea, fever.    ED Course: T 98.3  136/100  HR 93  RR 18. This is an elderly woman in no acute distress able to relate her symptoms and give a history  Review of Systems:  Review of Systems  Constitutional:  Negative for chills, fever and weight loss.  HENT:  Positive for hearing loss.   Eyes: Negative.   Respiratory: Negative.    Cardiovascular: Negative.   Gastrointestinal:  Positive for abdominal pain and vomiting.  Genitourinary: Negative.   Musculoskeletal:  Positive for back pain and joint pain.       Hammer toes which can cause her pain  Skin: Negative.   Neurological: Negative.   Endo/Heme/Allergies: Negative.   Psychiatric/Behavioral: Negative.      Past Medical History:  Diagnosis Date   Anemia    Anxiety    Aortic atherosclerosis (HCC)    Breast cancer, stage 1, estrogen receptor negative (HCC) 07/06/2011   Cancer of  breast (HCC)    Cataract    Chronic ITP (idiopathic thrombocytopenia) (HCC) 07/06/2011   Constipation    takes Colace daily   COPD (chronic obstructive pulmonary disease) (HCC)    mild   Dementia (HCC)    Depression    "nervous break down age 10"   Fast heart beat    GERD (gastroesophageal reflux disease)    takes Nexium daily   History of colon polyps    History of depression    received shock treatments    History of palpitations    takes Metoprolol daily   History of vertigo    HOH (hard of hearing)    has hearing aids   Hyperlipidemia    takes Lovastatin daily   Hypertension    Hypothyroid 07/06/2011   takes SYnthroid daily   Joint pain    Joint swelling    Lymphoma (HCC)    Lymphoma (HCC)    B cell    Lymphoma of lymph nodes of head, face, and/or neck (HCC) 07/06/2011   Neuropathy    Nocturia    Osteopenia    Osteoporosis    hx of and take Reclast   Peripheral neuropathy    Personal history of chemotherapy    Personal history of radiation therapy    Right knee DJD    Shortness of breath dyspnea    with exertion   Thyroid disease    Umbilical hernia    Vitamin D deficiency     Past  Surgical History:  Procedure Laterality Date   ADENOIDECTOMY     as a child   BREAST LUMPECTOMY Right 1994   BREAST SURGERY Right    BUNIONECTOMY Right    CARDIAC CATHETERIZATION  2014   COLONOSCOPY     FOOT SURGERY Right    FRACTURE SURGERY     wrist right , right knee   INSERTION OF MESH N/A 04/17/2015   Procedure: INSERTION OF MESH;  Surgeon: Axel Filler, MD;  Location: [facility] OR;  Service: General;  Laterality: N/A;   KNEE SURGERY     LEFT HEART CATHETERIZATION WITH CORONARY ANGIOGRAM N/A 12/05/2012   Procedure: LEFT HEART CATHETERIZATION WITH CORONARY ANGIOGRAM;  Surgeon: Lennette Bihari, MD;  Location: [facility] CATH LAB;  Service: Cardiovascular;  Laterality: N/A;   right  breast surgery     d/t breast cancer   TONSILLECTOMY     as a child   TOTAL KNEE ARTHROPLASTY  Right 06/19/2013   Procedure: TOTAL KNEE ARTHROPLASTY;  Surgeon: Nilda Simmer, MD;  Location: [facility] OR;  Service: Orthopedics;  Laterality: Right;   TRANSURETHRAL RESECTION OF BLADDER TUMOR WITH GYRUS (TURBT-GYRUS)     01-26-18 Dr. Marlou Porch   TRANSURETHRAL RESECTION OF BLADDER TUMOR WITH MITOMYCIN-C Bilateral 01/26/2018   Procedure: TRANSURETHRAL RESECTION OF BLADDER TUMOR WITH GEMCITABIN BLLATERAL RETROGRADE PYELOGRAM;  Surgeon: Crist Fat, MD;  Location: [facility] ORS;  Service: Urology;  Laterality: Bilateral;   TURBINATE REDUCTION     UMBILICAL HERNIA REPAIR N/A 04/17/2015   Procedure: LAPAROSCOPIC UMBILICAL HERNIA REPAIR WITH MESH;  Surgeon: Axel Filler, MD;  Location: [facility] OR;  Service: General;  Laterality: N/A;   UPPER GASTROINTESTINAL ENDOSCOPY      Soc Hx  - Marriage #1 -not too long, Marriage #2 - a dozen years or so, two children, Marriage #3 22 years until she was widowed. She worked in Engineering geologist. She and her husband lived in [place] where he was a Engineer, manufacturing for the [employer]. She lives alone in her own home with her dog. Her daughter is nearby.   reports that she quit smoking about 39 years ago. Her smoking use included cigarettes. She has never used smokeless tobacco. She reports that she does not drink alcohol and does not use drugs.  Allergies  Allergen Reactions   Oxycodone Other (See Comments)    Made her depressed   Oxycodone Hcl     Other reaction(s): depressed feeling   Sulfamethoxazole-Trimethoprim     Other reaction(s): Unknown   Codeine Itching            Doxycycline Swelling and Other (See Comments)    Burns throat also   Meperidine And Related Itching and Rash    Makes cheeks red also   Sulfa Antibiotics Itching   Sulfacetamide Sodium Itching    Family History  Problem Relation Age of Onset   Other Mother        C Diff   Hyperlipidemia Father    Heart disease Father    Heart disease Sister    Cancer Brother        Agent orange   Heart  disease Maternal Grandfather    Diabetes Son    Mood Disorder Daughter    Hypertension Daughter    Colon cancer Neg Hx    Breast cancer Neg Hx     Prior to Admission medications   Medication Sig Start Date End Date Taking? Authorizing Provider  amoxicillin-clavulanate (AUGMENTIN) 875-125 MG tablet Take 1 tablet by mouth 2 (  two) times daily. For 5 days. 03/17/23  Yes [provider]  citalopram (CELEXA) 40 MG tablet Take 40 mg by mouth daily.    Yes [provider]  levothyroxine (SYNTHROID, LEVOTHROID) 75 MCG tablet Take 75 mcg by mouth daily before breakfast.    Yes [provider]  acetaminophen (TYLENOL) 500 MG tablet Take 1 tablet (500 mg total) by mouth every 6 (six) hours as needed for mild pain. 04/19/16   Tilden Fossa, MD  amoxicillin (AMOXIL) 500 MG capsule Take 2,000 mg by mouth once as needed (One hour prior to dental appointment).    [provider]  aspirin 81 MG tablet Take 81 mg by mouth daily.    [provider]  azithromycin (ZITHROMAX) 250 MG tablet Take 1 tablet (250 mg total) by mouth daily. Take 1 every day until finished, starting tomorrow. 03/17/23   Gloris Manchester, MD  betamethasone dipropionate 0.05 % cream     [provider]  cholecalciferol (VITAMIN D) 1000 UNITS tablet Take 1,000 Units by mouth daily.    [provider]  Clobetasol Propionate 0.05 % shampoo     [provider]  Coenzyme Q10 (CO Q 10) 100 MG CAPS See admin instructions.    [provider]  Cyanocobalamin (VITAMIN B-12 PO) Take 1,000 mcg by mouth daily.    [provider]  diclofenac Sodium (VOLTAREN) 1 % GEL     [provider]  ergocalciferol (VITAMIN D2) 1.25 MG (50000 UT) capsule  06/18/18   [provider]  esomeprazole (NEXIUM) 40 MG capsule Take 40 mg by mouth 2 (two) times daily before a meal.    [provider]  famciclovir (FAMVIR) 125 MG tablet  08/15/18   [provider]  Iron, Ferrous Sulfate, 325 (65 Fe) MG TABS Take 1 tablet by mouth daily.    [provider]  lidocaine (LIDODERM) 5 % Place 1 patch onto the skin daily. Remove & Discard patch within 12 hours or as directed by MD 03/17/23   Gloris Manchester, MD  lovastatin (MEVACOR) 20 MG tablet TAKE 1 TABLET BY MOUTH ONCEDAILY AT BEDTIME    [provider]  magnesium oxide (MAG-OX) 400 MG tablet Take 400 mg by mouth daily.    [provider]  methocarbamol (ROBAXIN) 500 MG tablet Take 1 tablet (500 mg total) by mouth every 8 (eight) hours as needed for muscle spasms. 03/17/23   Gloris Manchester, MD  metoprolol succinate (TOPROL-XL) 25 MG 24 hr tablet Take 25 mg by mouth daily.     [provider]  mirabegron ER (MYRBETRIQ) 25 MG TB24 tablet Take 25 mg by mouth daily.    [provider]  Multiple Vitamins-Minerals (MULTIVITAMINS THER. W/MINERALS) TABS Take 1 tablet by mouth daily.    [provider]  nystatin-triamcinolone ointment (MYCOLOG) Apply 1 Application topically 2 (two) times daily.    [provider]  ondansetron (ZOFRAN-ODT) 4 MG disintegrating tablet Take 1 tablet (4 mg total) by mouth every 8 (eight) hours as needed for nausea or vomiting. 03/29/23   Kommor, Madison, MD  oxyCODONE-acetaminophen (PERCOCET/ROXICET) 5-325 MG tablet Take 1 tablet by mouth every 6 (six) hours as needed for severe pain (pain score 7-10). 03/29/23   Kommor, Madison, MD  Polyethyl Glycol-Propyl Glycol 0.4-0.3 % SOLN Apply 2 drops to eye 2 (two) times daily as needed (for dry eyes).    [provider]  pregabalin (LYRICA) 50 MG capsule Take 50 mg by mouth 2 (two)  times daily.    [provider]  Probiotic Product (PROBIOTIC PO) Take 1 tablet by mouth daily.    [provider]  traMADol Janean Sark) 50 MG tablet     [provider]    Physical Exam: Vitals:   04/04/23 1300 04/04/23 1400 04/04/23 1543 04/04/23 1610  BP: (!)  136/100 (!) 163/89  (!) 146/78  Pulse: 93 86  99  Resp: 18 18  16   Temp:   98 F (36.7 C) 98.4 F (36.9 C)  TempSrc:   Oral Oral  SpO2: 94% 96%  100%  Weight:      Height:        Physical Exam Vitals and nursing note reviewed.  Constitutional:      Appearance: She is well-developed.     Comments: Short-stature with central obesity  HENT:     Head: Normocephalic and atraumatic.     Mouth/Throat:     Mouth: Mucous membranes are moist.     Pharynx: Oropharynx is clear.  Eyes:     Extraocular Movements: Extraocular movements intact.     Pupils: Pupils are equal, round, and reactive to light.  Cardiovascular:     Rate and Rhythm: Normal rate and regular rhythm.     Heart sounds: Normal heart sounds. No murmur heard. Pulmonary:     Effort: Pulmonary effort is normal.     Breath sounds: Normal breath sounds.  Abdominal:     General: Abdomen is protuberant. Bowel sounds are normal. There is no distension.     Palpations: Abdomen is soft. There is no mass.     Tenderness: There is abdominal tenderness in the right upper quadrant. There is no guarding or rebound.  Feet:     Comments: Hammer toe deformities both feet all toes Skin:    General: Skin is warm and dry.  Neurological:     General: No focal deficit present.     Mental Status: She is alert and oriented to person, place, and time.  Psychiatric:        Mood and Affect: Mood normal.        Behavior: Behavior normal.      Labs on Admission: I have personally reviewed following labs and imaging studies  CBC: Recent Labs  Lab 03/29/23 1532 04/04/23 1145  WBC 8.0 8.5  NEUTROABS 5.4 6.0  HGB 13.1 12.9  HCT 41.5 39.7  MCV 92.6 90.4  PLT 186 129*   Basic Metabolic Panel: Recent Labs  Lab 03/29/23 1532 04/04/23 1145  NA 138 135  K 3.6 3.4*  CL 104 104  CO2 25 20*  GLUCOSE 102* 107*  BUN 8 15  CREATININE 0.85 0.58  CALCIUM 9.9 9.7   GFR: Estimated Creatinine Clearance: 47.8 mL/min (by C-G formula based  on SCr of 0.58 mg/dL). Liver Function Tests: Recent Labs  Lab 03/29/23 1532 04/04/23 1145  AST 19 16  ALT 24 16  ALKPHOS 50 48  BILITOT 1.3* 1.3*  PROT 6.3* 6.1*  ALBUMIN 3.8 3.7   Recent Labs  Lab 03/29/23 1532 04/04/23 1145  LIPASE 32 25   No results for input(s): "AMMONIA" in the last 168 hours. Coagulation Profile: No results for input(s): "INR", "PROTIME" in the last 168 hours. Cardiac Enzymes: No results for input(s): "CKTOTAL", "CKMB", "CKMBINDEX", "TROPONINI" in the last 168 hours. BNP (last 3 results) No results for input(s): "PROBNP" in the last 8760 hours. HbA1C: No results for input(s): "HGBA1C" in the last 72 hours. CBG: No results for  input(s): "GLUCAP" in the last 168 hours. Lipid Profile: No results for input(s): "CHOL", "HDL", "LDLCALC", "TRIG", "CHOLHDL", "LDLDIRECT" in the last 72 hours. Thyroid Function Tests: No results for input(s): "TSH", "T4TOTAL", "FREET4", "T3FREE", "THYROIDAB" in the last 72 hours. Anemia Panel: No results for input(s): "VITAMINB12", "FOLATE", "FERRITIN", "TIBC", "IRON", "RETICCTPCT" in the last 72 hours. Urine analysis:    Component Value Date/Time   COLORURINE YELLOW 04/04/2023 1329   APPEARANCEUR CLOUDY (A) 04/04/2023 1329   LABSPEC 1.006 04/04/2023 1329   LABSPEC 1.005 11/18/2015 1119   PHURINE 8.0 04/04/2023 1329   GLUCOSEU NEGATIVE 04/04/2023 1329   GLUCOSEU Negative 11/18/2015 1119   HGBUR SMALL (A) 04/04/2023 1329   BILIRUBINUR NEGATIVE 04/04/2023 1329   BILIRUBINUR Negative 11/18/2015 1119   KETONESUR NEGATIVE 04/04/2023 1329   PROTEINUR NEGATIVE 04/04/2023 1329   UROBILINOGEN 0.2 11/18/2015 1119   NITRITE NEGATIVE 04/04/2023 1329   LEUKOCYTESUR MODERATE (A) 04/04/2023 1329   LEUKOCYTESUR Negative 11/18/2015 1119    Radiological Exams on Admission: I have personally reviewed images US Abdomen Limited RUQ (LIVER/GB)  Result Date: 04/04/2023 CLINICAL DATA:  Abdominal pain EXAM: ULTRASOUND ABDOMEN LIMITED  RIGHT UPPER QUADRANT COMPARISON:  Ultrasound abdomen dated 03/29/2019 FINDINGS: Gallbladder: No wall thickening visualized. Again seen is 5 mm adherent nonshadowing echogenicity within the gallbladder body, likely a nonshadowing stone. No sonographic Murphy sign noted by sonographer. Common bile duct: Diameter: 4 mm Liver: No focal lesion identified. Within normal limits in parenchymal echogenicity. Portal vein is patent on color Doppler imaging with normal direction of blood flow towards the liver. Other: None. IMPRESSION: Cholelithiasis without acute cholecystitis. Electronically Signed   By: Agustin Cree M.D.   On: 04/04/2023 12:54    EKG: I have personally reviewed EKG: Sinus Rhythm with PVCs  Assessment/Plan Active Problems:   Cholelithiasis   Hypothyroid   Hyperlipidemia   Essential hypertension   Cognitive impairment    Assessment and Plan: Cholelithiasis Patient seen 03/29/23 for biliary colic with cholelithiasis w/o cholecystitis by U/S. She was able to be discharged from ED. She returns with recurrent RUQ abdominal pain, N/V. No evidence of infection.  Plan Med-surg admit  Ketorolac q 6 for pain control  Clear liquid diet  Lap chole per Dr. Guerry Minors consult during this admission  Cognitive impairment By record patient with mild cognitive impairment. She reports she lives alone with her dog. Her daughter thinks she is competent to make her own care decisions including consent to surgery  Essential hypertension BP mildly elevated. Renal function nl.  Plan  Continue home regimen  Hyperlipidemia Last lipid profile 2017.  Plan Lipid panel in AM  Continue home medication  Hypothyroid Last TSH 0.468 1 year ago.  Plan TSH  Continue home regimen       DVT prophylaxis: SQ Heparin Code Status: Full Code discussed with patient and her daughter. Referred daughter to [email or web address] Family Communication: spoke with Mason Jim - daughter  Disposition Plan:  home when medically stable Consults called: GS- Dr. Sheliah Hatch  Admission status: Inpatient, Med-Surg   Illene Regulus, MD [facility] 04/04/2023, 4:17 PM

## 2023-04-04 NOTE — ED Notes (Signed)
General surgery MD at bedside.

## 2023-04-05 ENCOUNTER — Inpatient Hospital Stay (HOSPITAL_COMMUNITY): Payer: Medicare Other

## 2023-04-05 DIAGNOSIS — Z8659 Personal history of other mental and behavioral disorders: Secondary | ICD-10-CM | POA: Diagnosis not present

## 2023-04-05 DIAGNOSIS — K8020 Calculus of gallbladder without cholecystitis without obstruction: Secondary | ICD-10-CM

## 2023-04-05 DIAGNOSIS — K8050 Calculus of bile duct without cholangitis or cholecystitis without obstruction: Secondary | ICD-10-CM | POA: Diagnosis not present

## 2023-04-05 MED ORDER — KETOROLAC TROMETHAMINE 15 MG/ML IJ SOLN: 15.0000 mg | Freq: Every day | INTRAMUSCULAR | Status: DC | PRN

## 2023-04-05 MED ORDER — TECHNETIUM TC 99M MEBROFENIN IV KIT
5.4000 | PACK | Freq: Once | INTRAVENOUS | Status: AC | PRN
  Administered 2023-04-05: 5.4 via INTRAVENOUS

## 2023-04-05 NOTE — Progress Notes (Signed)
PROGRESS NOTE  Darlene [name]    DOB: 10/11/1934, 88 y.o.  ZOX:[id number]    Code Status: Full Code   DOA: 04/04/2023   LOS: 1   Brief hospital course  Darlene [name] is a 88 y.o. female with past medical history significant for dementia, breast cancer, hypertension, non-Hodgkin's lymphoma, COPD, hypothyroid, GERD, hyperlipidemia, chronic ITP   They presented from home to the ED on 04/04/2023 with RUQ pain which has been ongoing issue evaluated outpatient.  In the ED, it was found that they had T 98.3 136/100 HR 93 RR 18.  Significant findings included K+ 3.4, otherwise unremarkable metabolic panel, CBC unremarkable with exception of platelets 129 which is baseline. TSH and lipase WNL. Urinalysis has some features consistent with UTI but is not a clean catch and she denies urinary symptoms.   They were initially treated with tylenol, analgesia, antiemetics. General surgery was consulted.   Patient was admitted to medicine service for further workup and management of RUQ pain, biliary cholic as outlined in detail below.  04/05/23 -stable, current RUQ pain appears to be well tolerated  Assessment & Plan  Principal Problem:   Cholelithiasis Active Problems:   Hypothyroid   Hyperlipidemia   Essential hypertension   Cognitive impairment  Cholelithiasis Patient seen 03/29/23 for biliary colic with cholelithiasis w/o cholecystitis by U/S. She was able to be discharged from ED. She returns with recurrent RUQ abdominal pain, N/V. No evidence of infection. - general surgery consulted  - HIDA scan  - cholecystectomy likely this admission - analgesia PRN   Cognitive impairment By record patient with mild cognitive impairment. She reports she lives alone with her dog. Her daughter thinks she is competent to make her own care decisions including consent to surgery   Essential hypertension BP mildly elevated. Renal function nl. -Continue home regimen   Hyperlipidemia -Continue home  medication   Hypothyroid - Continue home regimen  Body mass index is 30.18 kg/m.  VTE ppx: heparin injection 5,000 Units Start: 04/04/23 1800  Diet:     Diet   Diet NPO time specified   Consultants: General surgery   Subjective 04/05/23    Pt reports no current nausea. RUQ pain still present but tolerable currently. It comes and goes.    Objective   Vitals:   04/04/23 1742 04/04/23 2001 04/05/23 0300 04/05/23 0723  BP: (!) 149/75 (!) 148/76 (!) 168/79 (!) 155/89  Pulse: 95 94 86 88  Resp: 17   16  Temp: 98.5 F (36.9 C)  98 F (36.7 C) 98 F (36.7 C)  TempSrc: Oral Oral Oral Oral  SpO2: 95% 94% 96% 96%  Weight:      Height:        Intake/Output Summary (Last 24 hours) at 04/05/2023 0737 Last data filed at 04/04/2023 1812 Gross per 24 hour  Intake 96.42 ml  Output --  Net 96.42 ml   Filed Weights   04/04/23 1131  Weight: 74.8 kg    Physical Exam:  General: awake, alert, NAD HEENT: atraumatic, clear conjunctiva, anicteric sclera, MMM, hearing grossly normal Respiratory: normal respiratory effort. Cardiovascular: quick capillary refill, normal S1/S2, RRR, no JVD, murmurs Gastrointestinal: soft, tender to RUQ Nervous: A&O x3. normal speech Extremities: moves all equally, no edema, normal tone Skin: dry, intact, normal temperature, normal color. No rashes, lesions or ulcers on exposed skin Psychiatry: normal mood, congruent affect  Labs   I have personally reviewed the following labs and imaging studies CBC    Component  Value Date/Time   WBC 8.5 04/04/2023 1145   RBC 4.39 04/04/2023 1145   HGB 12.9 04/04/2023 1145   HGB 11.8 (L) 10/06/2021 1423   HGB 11.6 07/07/2016 1035   HCT 39.7 04/04/2023 1145   HCT 35.6 07/07/2016 1035   PLT 129 (L) 04/04/2023 1145   PLT 101 (L) 10/06/2021 1423   PLT 122 (L) 07/07/2016 1035   MCV 90.4 04/04/2023 1145   MCV 87.7 07/07/2016 1035   MCH 29.4 04/04/2023 1145   MCHC 32.5 04/04/2023 1145   RDW 12.8 04/04/2023  1145   RDW 13.5 07/07/2016 1035   LYMPHSABS 1.8 04/04/2023 1145   LYMPHSABS 1.4 07/07/2016 1035   MONOABS 0.6 04/04/2023 1145   MONOABS 0.4 07/07/2016 1035   EOSABS 0.1 04/04/2023 1145   EOSABS 0.1 07/07/2016 1035   BASOSABS 0.1 04/04/2023 1145   BASOSABS 0.1 07/07/2016 1035      Latest Ref Rng & Units 04/04/2023   11:45 AM 03/29/2023    3:32 PM 03/17/2023   12:39 AM  BMP  Glucose 70 - 99 mg/dL 259  563  92   BUN 8 - 23 mg/dL 15  8  11    Creatinine 0.44 - 1.00 mg/dL 8.75  6.43  3.29   Sodium 135 - 145 mmol/L 135  138  137   Potassium 3.5 - 5.1 mmol/L 3.4  3.6  3.6   Chloride 98 - 111 mmol/L 104  104  108   CO2 22 - 32 mmol/L 20  25  19    Calcium 8.9 - 10.3 mg/dL 9.7  9.9  9.6     US Abdomen Limited RUQ (LIVER/GB)  Result Date: 04/04/2023 CLINICAL DATA:  Abdominal pain EXAM: ULTRASOUND ABDOMEN LIMITED RIGHT UPPER QUADRANT COMPARISON:  Ultrasound abdomen dated 03/29/2019 FINDINGS: Gallbladder: No wall thickening visualized. Again seen is 5 mm adherent nonshadowing echogenicity within the gallbladder body, likely a nonshadowing stone. No sonographic Murphy sign noted by sonographer. Common bile duct: Diameter: 4 mm Liver: No focal lesion identified. Within normal limits in parenchymal echogenicity. Portal vein is patent on color Doppler imaging with normal direction of blood flow towards the liver. Other: None. IMPRESSION: Cholelithiasis without acute cholecystitis. Electronically Signed   By: Agustin Cree M.D.   On: 04/04/2023 12:54    Disposition Plan & Communication  Patient status: Inpatient  Admitted From: Home Planned disposition location: Home Anticipated discharge date: 12/4 pending post-op recovery  Family Communication: daughter on phone    Author: Leeroy Bock, DO [facility] 04/05/2023, 7:37 AM   Available by Epic secure chat 7AM-7PM. If 7PM-7AM, please contact night-coverage.  [facility] contact information found on [email or web address].

## 2023-04-05 NOTE — Plan of Care (Signed)
  Problem: Education: Goal: Knowledge of General Education information will improve Description: Including pain rating scale, medication(s)/side effects and non-pharmacologic comfort measures Outcome: Progressing   Problem: Nutrition: Goal: Adequate nutrition will be maintained Outcome: Progressing   Problem: Coping: Goal: Level of anxiety will decrease Outcome: Progressing   Problem: Elimination: Goal: Will not experience complications related to bowel motility Outcome: Progressing   Problem: Pain Management: Goal: General experience of comfort will improve Outcome: Progressing   Problem: Safety: Goal: Ability to remain free from injury will improve Outcome: Progressing   Problem: Skin Integrity: Goal: Risk for impaired skin integrity will decrease Outcome: Progressing

## 2023-04-05 NOTE — Progress Notes (Signed)
[facility] Progress Note     Subjective: CC:  Patient reports R flank/back pain that was severe overnight and associated with dry heaves. She denies large volume emesis. She reports similar pain when she diagnosed with biliary colic in the ED on 11/25. Denies previous epsidoes of similar pain, before 11/25. Denies post-prandial pain. States she lives at home alone with her dog she got a friendly center.  Denies history of cardiac or pulmonary issues.   Objective: Vital signs in last 24 hours: Temp:  [98 F (36.7 C)-98.5 F (36.9 C)] 98 F (36.7 C) (12/02 0723) Pulse Rate:  [81-99] 88 (12/02 0723) Resp:  [16-18] 16 (12/02 0723) BP: (136-168)/(75-122) 155/89 (12/02 0723) SpO2:  [94 %-100 %] 96 % (12/02 0723) Weight:  [74.8 kg] 74.8 kg (12/01 1131)    Intake/Output from previous day: 12/01 0701 - 12/02 0700 In: 96.4 [IV Piggyback:96.4] Out: -  Intake/Output this shift: No intake/output data recorded.  PE: Gen:  Alert, NAD, pleasant and cooperative  Card:  Regular rate and rhythm Pulm:  Normal effort ORA Abd: Soft, non-tender, mild distention, no RUQ pain, negative murphy's, reports subjective pain when I point/press over her right posterior/lateral chest wall/.  Skin: warm and dry, no rashes  Psych: A&Ox3   Lab Results:  Recent Labs    04/04/23 1145  WBC 8.5  HGB 12.9  HCT 39.7  PLT 129*   BMET Recent Labs    04/04/23 1145  NA 135  K 3.4*  CL 104  CO2 20*  GLUCOSE 107*  BUN 15  CREATININE 0.58  CALCIUM 9.7   PT/INR No results for input(s): "LABPROT", "INR" in the last 72 hours. CMP     Component Value Date/Time   NA 135 04/04/2023 1145   NA 135 (L) 07/07/2016 1035   K 3.4 (L) 04/04/2023 1145   K 4.8 07/07/2016 1035   CL 104 04/04/2023 1145   CL 106 07/11/2012 0906   CO2 20 (L) 04/04/2023 1145   CO2 24 07/07/2016 1035   GLUCOSE 107 (H) 04/04/2023 1145   GLUCOSE 85 07/07/2016 1035   GLUCOSE 107 (H) 07/11/2012 0906   BUN 15 04/04/2023  1145   BUN 19.9 07/07/2016 1035   CREATININE 0.58 04/04/2023 1145   CREATININE 0.70 10/01/2021 1320   CREATININE 0.7 07/07/2016 1035   CALCIUM 9.7 04/04/2023 1145   CALCIUM 10.2 07/07/2016 1035   PROT 6.1 (L) 04/04/2023 1145   PROT 6.6 07/07/2016 1035   ALBUMIN 3.7 04/04/2023 1145   ALBUMIN 3.9 07/07/2016 1035   AST 16 04/04/2023 1145   AST 17 10/01/2021 1320   AST 14 07/07/2016 1035   ALT 16 04/04/2023 1145   ALT 17 10/01/2021 1320   ALT 17 07/07/2016 1035   ALKPHOS 48 04/04/2023 1145   ALKPHOS 58 07/07/2016 1035   BILITOT 1.3 (H) 04/04/2023 1145   BILITOT 1.4 (H) 10/01/2021 1320   BILITOT 0.61 07/07/2016 1035   GFRNONAA >60 04/04/2023 1145   GFRNONAA >60 10/01/2021 1320   GFRAA >60 05/19/2018 1551   Lipase     Component Value Date/Time   LIPASE 25 04/04/2023 1145       Studies/Results: US Abdomen Limited RUQ (LIVER/GB)  Result Date: 04/04/2023 CLINICAL DATA:  Abdominal pain EXAM: ULTRASOUND ABDOMEN LIMITED RIGHT UPPER QUADRANT COMPARISON:  Ultrasound abdomen dated 03/29/2019 FINDINGS: Gallbladder: No wall thickening visualized. Again seen is 5 mm adherent nonshadowing echogenicity within the gallbladder body, likely a nonshadowing stone. No sonographic Murphy sign noted by  sonographer. Common bile duct: Diameter: 4 mm Liver: No focal lesion identified. Within normal limits in parenchymal echogenicity. Portal vein is patent on color Doppler imaging with normal direction of blood flow towards the liver. Other: None. IMPRESSION: Cholelithiasis without acute cholecystitis. Electronically Signed   By: Agustin Cree M.D.   On: 04/04/2023 12:54    Anti-infectives: Anti-infectives (From admission, onward)    Start     Dose/Rate Route Frequency Ordered Stop   04/05/23 1000  cefTRIAXone (ROCEPHIN) 2 g in sodium chloride 0.9 % 100 mL IVPB        2 g 200 mL/hr over 30 Minutes Intravenous Daily 04/04/23 2346     04/04/23 1645  valACYclovir (VALTREX) tablet 1,000 mg  Status:   Discontinued        1,000 mg Oral Daily 04/04/23 1630 04/04/23 1719   04/04/23 1400  cefTRIAXone (ROCEPHIN) 1 g in sodium chloride 0.9 % 100 mL IVPB        1 g 200 mL/hr over 30 Minutes Intravenous  Once 04/04/23 1351 04/04/23 1438        Assessment/Plan  88 y/o F with recent diagnosis of biliary colic.  - WBC 8.5, LFTs unremarkable  - RUQ U/S yesterday w/ one 5 mm gallstone, no cholecystitis.  - patient does have persistent R flank/R posterior chest wall pain. HIDA scan pending to evaluate for cholecystitis. Plan HH diet after HIDA scan. If she has cholecystitis, or strong clinical evidence of severe biliary colic, we will proceed with plans for cholecystectomy this admission. She also has signs of UTI on her UA, culture in pending. I called the patients daughter and updated her on this plan of care.  - on rocephin    LOS: 1 day   I reviewed nursing notes, ED provider notes, hospitalist notes, last 24 h vitals and pain scores, last 48 h intake and output, last 24 h labs and trends, and last 24 h imaging results.  This care required moderate level of medical decision making.   Hosie Spangle, PA-C [facility] Please see Amion for pager number during day hours 7:00am-4:30pm

## 2023-04-05 NOTE — Plan of Care (Signed)

## 2023-04-06 DIAGNOSIS — K8050 Calculus of bile duct without cholangitis or cholecystitis without obstruction: Secondary | ICD-10-CM | POA: Diagnosis not present

## 2023-04-06 DIAGNOSIS — Z789 Other specified health status: Secondary | ICD-10-CM

## 2023-04-06 DIAGNOSIS — R451 Restlessness and agitation: Secondary | ICD-10-CM

## 2023-04-06 DIAGNOSIS — K8070 Calculus of gallbladder and bile duct without cholecystitis without obstruction: Secondary | ICD-10-CM | POA: Diagnosis not present

## 2023-04-06 DIAGNOSIS — F419 Anxiety disorder, unspecified: Secondary | ICD-10-CM

## 2023-04-06 LAB — BASIC METABOLIC PANEL
Anion gap: 9 (ref 5–15)
BUN: 10 mg/dL (ref 8–23)
CO2: 23 mmol/L (ref 22–32)
Calcium: 9.1 mg/dL (ref 8.9–10.3)
Chloride: 106 mmol/L (ref 98–111)
Creatinine, Ser: 0.99 mg/dL (ref 0.44–1.00)
GFR, Estimated: 56 mL/min — ABNORMAL LOW (ref >60)
Glucose, Bld: 93 mg/dL (ref 70–99)
Potassium: 3.1 mmol/L — ABNORMAL LOW (ref 3.5–5.1)
Sodium: 138 mmol/L (ref 135–145)

## 2023-04-06 LAB — CBC
HCT: 41.3 % (ref 36.0–46.0)
Hemoglobin: 13.4 g/dL (ref 12.0–15.0)
MCH: 29.5 pg (ref 26.0–34.0)
MCHC: 32.4 g/dL (ref 30.0–36.0)
MCV: 90.8 fL (ref 80.0–100.0)
Platelets: 144 10*3/uL — ABNORMAL LOW (ref 150–400)
RBC: 4.55 MIL/uL (ref 3.87–5.11)
RDW: 12.9 % (ref 11.5–15.5)
WBC: 6.8 10*3/uL (ref 4.0–10.5)
nRBC: 0 % (ref 0.0–0.2)

## 2023-04-06 LAB — URINE CULTURE: Culture: NO GROWTH

## 2023-04-06 MED ORDER — IRON (FERROUS SULFATE) 325 (65 FE) MG PO TABS: 1.0000 | ORAL_TABLET | ORAL | Status: AC

## 2023-04-06 MED ORDER — POTASSIUM CHLORIDE CRYS ER 20 MEQ PO TBCR
40.0000 meq | EXTENDED_RELEASE_TABLET | Freq: Two times a day (BID) | ORAL | Status: DC
  Administered 2023-04-06: 40 meq via ORAL
  Filled 2023-04-06: qty 2

## 2023-04-06 NOTE — Progress Notes (Incomplete)
PROGRESS NOTE  Darlene [name]    DOB: [birth year], 88 y.o.  WGN:[id number]    Code Status: Full Code   DOA: 04/04/2023   LOS: 2   Brief hospital course  Darlene [name] is a 88 y.o. female with past medical history significant for dementia, breast cancer, hypertension, non-Hodgkin's lymphoma, COPD, hypothyroid, GERD, hyperlipidemia, chronic ITP   They presented from home to the ED on 04/04/2023 with RUQ pain which has been ongoing issue evaluated outpatient.  In the ED, it was found that they had T 98.3 136/100 HR 93 RR 18.  Significant findings included K+ 3.4, otherwise unremarkable metabolic panel, CBC unremarkable with exception of platelets 129 which is baseline. TSH and lipase WNL. Urinalysis has some features consistent with UTI but is not a clean catch and she denies urinary symptoms.   They were initially treated with tylenol, analgesia, antiemetics. General surgery was consulted.   Patient was admitted to medicine service for further workup and management of RUQ pain, biliary cholic as outlined in detail below.  04/06/23 -stable, current RUQ pain appears to be well tolerated  Assessment & Plan  Principal Problem:   Cholelithiasis Active Problems:   Hypothyroid   Hyperlipidemia   Essential hypertension   Cognitive impairment   Recurrent biliary colic   History of dementia  Cholelithiasis Patient seen 03/29/23 for biliary colic with cholelithiasis w/o cholecystitis by U/S. She was able to be discharged from ED. She returns with recurrent RUQ abdominal pain, N/V. No evidence of infection. - general surgery consulted  - HIDA scan  - cholecystectomy likely this admission - analgesia PRN   Cognitive impairment By record patient with mild cognitive impairment. She reports she lives alone with her dog. Her daughter thinks she is competent to make her own care decisions including consent to surgery   Essential hypertension BP mildly elevated. Renal function  nl. -Continue home regimen   Hyperlipidemia -Continue home medication   Hypothyroid - Continue home regimen  Body mass index is 30.18 kg/m.  VTE ppx: heparin injection 5,000 Units Start: 04/04/23 1800  Diet:     Diet   Diet Heart Room service appropriate? Yes; Fluid consistency: Thin   Consultants: General surgery   Subjective 04/06/23    Pt reports no current nausea. RUQ pain still present but tolerable currently. It comes and goes.    Objective   Vitals:   04/05/23 0723 04/05/23 1551 04/05/23 1953 04/06/23 0418  BP: (!) 155/89 121/68 137/78 (!) 154/78  Pulse: 88 92 75 78  Resp: 16 16 17 17   Temp: 98 F (36.7 C) 98.9 F (37.2 C) 98.5 F (36.9 C) 97.7 F (36.5 C)  TempSrc: Oral Oral Oral   SpO2: 96% 94% 94% 95%  Weight:      Height:        Intake/Output Summary (Last 24 hours) at 04/06/2023 0814 Last data filed at 04/06/2023 0421 Gross per 24 hour  Intake 100 ml  Output 1050 ml  Net -950 ml   Filed Weights   04/04/23 1131  Weight: 74.8 kg    Physical Exam:  General: awake, alert, NAD HEENT: atraumatic, clear conjunctiva, anicteric sclera, MMM, hearing grossly normal Respiratory: normal respiratory effort. Cardiovascular: quick capillary refill, normal S1/S2, RRR, no JVD, murmurs Gastrointestinal: soft, tender to RUQ Nervous: A&O x3. normal speech Extremities: moves all equally, no edema, normal tone Skin: dry, intact, normal temperature, normal color. No rashes, lesions or ulcers on exposed skin Psychiatry: normal mood, congruent affect  Labs  I have personally reviewed the following labs and imaging studies CBC    Component Value Date/Time   WBC 6.8 04/06/2023 0622   RBC 4.55 04/06/2023 0622   HGB 13.4 04/06/2023 0622   HGB 11.8 (L) 10/06/2021 1423   HGB 11.6 07/07/2016 1035   HCT 41.3 04/06/2023 0622   HCT 35.6 07/07/2016 1035   PLT 144 (L) 04/06/2023 0622   PLT 101 (L) 10/06/2021 1423   PLT 122 (L) 07/07/2016 1035   MCV 90.8  04/06/2023 0622   MCV 87.7 07/07/2016 1035   MCH 29.5 04/06/2023 0622   MCHC 32.4 04/06/2023 0622   RDW 12.9 04/06/2023 0622   RDW 13.5 07/07/2016 1035   LYMPHSABS 1.8 04/04/2023 1145   LYMPHSABS 1.4 07/07/2016 1035   MONOABS 0.6 04/04/2023 1145   MONOABS 0.4 07/07/2016 1035   EOSABS 0.1 04/04/2023 1145   EOSABS 0.1 07/07/2016 1035   BASOSABS 0.1 04/04/2023 1145   BASOSABS 0.1 07/07/2016 1035      Latest Ref Rng & Units 04/06/2023    6:22 AM 04/04/2023   11:45 AM 03/29/2023    3:32 PM  BMP  Glucose 70 - 99 mg/dL 93  161  096   BUN 8 - 23 mg/dL 10  15  8    Creatinine 0.44 - 1.00 mg/dL 0.45  4.09  8.11   Sodium 135 - 145 mmol/L 138  135  138   Potassium 3.5 - 5.1 mmol/L 3.1  3.4  3.6   Chloride 98 - 111 mmol/L 106  104  104   CO2 22 - 32 mmol/L 23  20  25    Calcium 8.9 - 10.3 mg/dL 9.1  9.7  9.9     NM Hepatobiliary Liver Func  Result Date: 04/05/2023 CLINICAL DATA:  RIGHT upper quadrant pain. Nausea and dry heaving. Cholelithiasis on ultrasound EXAM: NUCLEAR MEDICINE HEPATOBILIARY IMAGING TECHNIQUE: Sequential images of the abdomen were obtained out to 60 minutes following intravenous administration of radiopharmaceutical. RADIOPHARMACEUTICALS:  5.4 mCi Tc-53m  Choletec IV COMPARISON:  Abdominal ultrasound 04/04/2023 FINDINGS: Prompt uptake and biliary excretion of activity by the liver is seen. Gallbladder activity is visualized, consistent with patency of cystic duct. Biliary activity passes into small bowel, consistent with patent common bile duct. IMPRESSION: 1. Patent common bile duct and cystic duct. 2. No evidence of acute cholecystitis. Electronically Signed   By: Genevive Bi M.D.   On: 04/05/2023 12:10   US Abdomen Limited RUQ (LIVER/GB)  Result Date: 04/04/2023 CLINICAL DATA:  Abdominal pain EXAM: ULTRASOUND ABDOMEN LIMITED RIGHT UPPER QUADRANT COMPARISON:  Ultrasound abdomen dated 03/29/2019 FINDINGS: Gallbladder: No wall thickening visualized. Again seen is 5 mm  adherent nonshadowing echogenicity within the gallbladder body, likely a nonshadowing stone. No sonographic Murphy sign noted by sonographer. Common bile duct: Diameter: 4 mm Liver: No focal lesion identified. Within normal limits in parenchymal echogenicity. Portal vein is patent on color Doppler imaging with normal direction of blood flow towards the liver. Other: None. IMPRESSION: Cholelithiasis without acute cholecystitis. Electronically Signed   By: Agustin Cree M.D.   On: 04/04/2023 12:54    Disposition Plan & Communication  Patient status: Inpatient  Admitted From: Home Planned disposition location: Home Anticipated discharge date: 12/4 pending post-op recovery  Family Communication: daughter on phone    Author: Leeroy Bock, DO [facility] 04/06/2023, 8:14 AM   Available by Epic secure chat 7AM-7PM. If 7PM-7AM, please contact night-coverage.  [facility] contact information found on [email or web address].

## 2023-04-06 NOTE — Progress Notes (Addendum)
[facility] Progress Note     Subjective: CC:  Reports right posterior/lateral chest wall pain, worse with movement, not worse with eating. Denies vomiting. States she has had a lot of falls lately. Denies BM since admission. Wants to sit up but states she was "wobbly" yesterday when staff tried to get her up.   Objective: Vital signs in last 24 hours: Temp:  [97.7 F (36.5 C)-98.9 F (37.2 C)] 98.1 F (36.7 C) (12/03 0837) Pulse Rate:  [72-92] 72 (12/03 0837) Resp:  [16-18] 18 (12/03 0837) BP: (121-154)/(68-78) 148/74 (12/03 0837) SpO2:  [94 %-95 %] 95 % (12/03 0837)    Intake/Output from previous day: 12/02 0701 - 12/03 0700 In: 100 [IV Piggyback:100] Out: 1050 [Urine:1050] Intake/Output this shift: Total I/O In: 220 [P.O.:220] Out: -   PE: Gen:  Alert, NAD, pleasant and cooperative  Card:  Regular rate and rhythm Pulm:  Normal effort ORA Abd: Soft, non-tender, ni RUQ pain. Tender over right lateral chest wall over her lower ribcage.  Skin: warm and dry, no rashes  Psych: A&Ox3   Lab Results:  Recent Labs    04/04/23 1145 04/06/23 0622  WBC 8.5 6.8  HGB 12.9 13.4  HCT 39.7 41.3  PLT 129* 144*   BMET Recent Labs    04/04/23 1145 04/06/23 0622  NA 135 138  K 3.4* 3.1*  CL 104 106  CO2 20* 23  GLUCOSE 107* 93  BUN 15 10  CREATININE 0.58 0.99  CALCIUM 9.7 9.1   PT/INR No results for input(s): "LABPROT", "INR" in the last 72 hours. CMP     Component Value Date/Time   NA 138 04/06/2023 0622   NA 135 (L) 07/07/2016 1035   K 3.1 (L) 04/06/2023 0622   K 4.8 07/07/2016 1035   CL 106 04/06/2023 0622   CL 106 07/11/2012 0906   CO2 23 04/06/2023 0622   CO2 24 07/07/2016 1035   GLUCOSE 93 04/06/2023 0622   GLUCOSE 85 07/07/2016 1035   GLUCOSE 107 (H) 07/11/2012 0906   BUN 10 04/06/2023 0622   BUN 19.9 07/07/2016 1035   CREATININE 0.99 04/06/2023 0622   CREATININE 0.70 10/01/2021 1320   CREATININE 0.7 07/07/2016 1035   CALCIUM 9.1  04/06/2023 0622   CALCIUM 10.2 07/07/2016 1035   PROT 6.1 (L) 04/04/2023 1145   PROT 6.6 07/07/2016 1035   ALBUMIN 3.7 04/04/2023 1145   ALBUMIN 3.9 07/07/2016 1035   AST 16 04/04/2023 1145   AST 17 10/01/2021 1320   AST 14 07/07/2016 1035   ALT 16 04/04/2023 1145   ALT 17 10/01/2021 1320   ALT 17 07/07/2016 1035   ALKPHOS 48 04/04/2023 1145   ALKPHOS 58 07/07/2016 1035   BILITOT 1.3 (H) 04/04/2023 1145   BILITOT 1.4 (H) 10/01/2021 1320   BILITOT 0.61 07/07/2016 1035   GFRNONAA 56 (L) 04/06/2023 0622   GFRNONAA >60 10/01/2021 1320   GFRAA >60 05/19/2018 1551   Lipase     Component Value Date/Time   LIPASE 25 04/04/2023 1145       Studies/Results: NM Hepatobiliary Liver Func  Result Date: 04/05/2023 CLINICAL DATA:  RIGHT upper quadrant pain. Nausea and dry heaving. Cholelithiasis on ultrasound EXAM: NUCLEAR MEDICINE HEPATOBILIARY IMAGING TECHNIQUE: Sequential images of the abdomen were obtained out to 60 minutes following intravenous administration of radiopharmaceutical. RADIOPHARMACEUTICALS:  5.4 mCi Tc-18m  Choletec IV COMPARISON:  Abdominal ultrasound 04/04/2023 FINDINGS: Prompt uptake and biliary excretion of activity by the liver is seen. Gallbladder activity  is visualized, consistent with patency of cystic duct. Biliary activity passes into small bowel, consistent with patent common bile duct. IMPRESSION: 1. Patent common bile duct and cystic duct. 2. No evidence of acute cholecystitis. Electronically Signed   By: Genevive Bi M.D.   On: 04/05/2023 12:10   US Abdomen Limited RUQ (LIVER/GB)  Result Date: 04/04/2023 CLINICAL DATA:  Abdominal pain EXAM: ULTRASOUND ABDOMEN LIMITED RIGHT UPPER QUADRANT COMPARISON:  Ultrasound abdomen dated 03/29/2019 FINDINGS: Gallbladder: No wall thickening visualized. Again seen is 5 mm adherent nonshadowing echogenicity within the gallbladder body, likely a nonshadowing stone. No sonographic Murphy sign noted by sonographer. Common bile  duct: Diameter: 4 mm Liver: No focal lesion identified. Within normal limits in parenchymal echogenicity. Portal vein is patent on color Doppler imaging with normal direction of blood flow towards the liver. Other: None. IMPRESSION: Cholelithiasis without acute cholecystitis. Electronically Signed   By: Agustin Cree M.D.   On: 04/04/2023 12:54    Anti-infectives: Anti-infectives (From admission, onward)    Start     Dose/Rate Route Frequency Ordered Stop   04/05/23 1000  cefTRIAXone (ROCEPHIN) 2 g in sodium chloride 0.9 % 100 mL IVPB        2 g 200 mL/hr over 30 Minutes Intravenous Daily 04/04/23 2346     04/04/23 1645  valACYclovir (VALTREX) tablet 1,000 mg  Status:  Discontinued        1,000 mg Oral Daily 04/04/23 1630 04/04/23 1719   04/04/23 1400  cefTRIAXone (ROCEPHIN) 1 g in sodium chloride 0.9 % 100 mL IVPB        1 g 200 mL/hr over 30 Minutes Intravenous  Once 04/04/23 1351 04/04/23 1438        Assessment/Plan  88 y/o F with recent diagnosis of gallstones who presents with R flank/chest wall pain - WBC 8.5, LFTs unremarkable  - RUQ U/S w/ one 5 mm gallstone, no cholecystitis. HIDA negative for cholecystitis. No acute surgical needs.  - I suspect her pain is musculoskeletal in nature and the gallstone was an incidental finding. She has a lidoderm patch and k pad ordered for musculoskeletal pain. If she has melena or pain/nausea with PO intake then I would recommend GI consult for consideration of upper endoscopy for evaluate for PUD prior to any further discussions about surgery for possible symptomatic cholelithiasis, as my suspicion for this is low. General surgery will sign off. Call as needed.  - Urine Cx pending. She remains on rocephin.    LOS: 2 days   I reviewed nursing notes, ED provider notes, hospitalist notes, last 24 h vitals and pain scores, last 48 h intake and output, last 24 h labs and trends, and last 24 h imaging results.  This care required moderate level of  medical decision making.   Hosie Spangle, PA-C [facility] Please see Amion for pager number during day hours 7:00am-4:30pm

## 2023-04-06 NOTE — Care Management Important Message (Signed)
Important Message  Patient Details  Name: Darlene [name] MRN: [id number] Date of Birth: August 27, 1934   Important Message Given:  Yes - Medicare IM     Sherilyn Banker 04/06/2023, 3:10 PM

## 2023-04-06 NOTE — Plan of Care (Addendum)
Pt is alert and oriented x3. Confused to situation. At hs got pt to edge of bed to attempt bsc for mobility. Pt unable to stand legs weak. Pt dangled at edge for couple of minutes. Purewick had been in place. Pt remains npo for possible procedure. Msg sent to Johann Capers NP due to possible procedure and heparin scheduled this am. Request to hold. Pt complaint of burning with urination this am. Per notes possible UTI.  Problem: Education: Goal: Knowledge of General Education information will improve Description: Including pain rating scale, medication(s)/side effects and non-pharmacologic comfort measures Outcome: Progressing   Problem: Health Behavior/Discharge Planning: Goal: Ability to manage health-related needs will improve Outcome: Progressing   Problem: Clinical Measurements: Goal: Ability to maintain clinical measurements within normal limits will improve Outcome: Progressing Goal: Will remain free from infection Outcome: Progressing Goal: Diagnostic test results will improve Outcome: Progressing Goal: Respiratory complications will improve Outcome: Progressing Goal: Cardiovascular complication will be avoided Outcome: Progressing   Problem: Activity: Goal: Risk for activity intolerance will decrease Outcome: Progressing   Problem: Nutrition: Goal: Adequate nutrition will be maintained Outcome: Progressing   Problem: Coping: Goal: Level of anxiety will decrease Outcome: Progressing   Problem: Elimination: Goal: Will not experience complications related to bowel motility Outcome: Progressing Goal: Will not experience complications related to urinary retention Outcome: Progressing   Problem: Pain Management: Goal: General experience of comfort will improve Outcome: Progressing   Problem: Safety: Goal: Ability to remain free from injury will improve Outcome: Progressing   Problem: Skin Integrity: Goal: Risk for impaired skin integrity will decrease Outcome:  Progressing

## 2023-04-06 NOTE — Progress Notes (Signed)
Darlene [name] to be D/C'd  per MD order.  Discussed with the patient and all questions fully answered.  VSS, Skin clean, dry and intact without evidence of skin break down, no evidence of skin tears noted.  IV catheter discontinued intact. Site without signs and symptoms of complications. Dressing and pressure applied.  An After Visit Summary was printed and given to the patient and son, Richardean Chimera.   D/c education completed with patient/family including follow up instructions, medication list, d/c activities limitations if indicated, with other d/c instructions as indicated by MD - patient able to verbalize understanding, all questions fully answered.   Patient instructed to return to ED, call 911, or call MD for any changes in condition.   Patient to be escorted via WC, and D/C home via private auto by son.

## 2023-04-06 NOTE — Plan of Care (Signed)

## 2023-04-06 NOTE — TOC Initial Note (Signed)
Transition of Care [facility]) - Initial/Assessment Note    Patient Details  Name: Darlene [name] MRN: [id number] Date of Birth: [birth year]  Transition of Care [facility]) CM/SW Contact:    Kingsley Plan, RN Phone Number: 04/06/2023, 4:45 PM  Clinical Narrative:                 Spoke to patient and son at bedside. PT recommending SNF for short term rehab.   Patient wanting to go home at discharge. Son states they have arranged caregivers through [facility] . Discussed home health PT/OT both in agreement , and have no preference.   Tresa Endo with [facility] accepted referral.   PT recommended bedside commode / 3 in 1 , patient already has the same at home   Expected Discharge Plan: Home w Home Health Services Barriers to Discharge: No Barriers Identified   Patient Goals and CMS Choice Patient states their goals for this hospitalization and ongoing recovery are:: to return to home CMS Medicare.gov Compare Post Acute Care list provided to:: Patient Choice offered to / list presented to : Patient      Expected Discharge Plan and Services   Discharge Planning Services: CM Consult Post Acute Care Choice: Home Health Living arrangements for the past 2 months: Single Family Home Expected Discharge Date: 04/06/23               DME Arranged: N/A         HH Arranged: PT, OT HH Agency: [facility] Date HH Agency Contacted: 04/06/23 Time HH Agency Contacted: [id number] Representative spoke with at [facility] Agency: Tresa Endo  Prior Living Arrangements/Services Living arrangements for the past 2 months: Single Family Home Lives with:: Self Patient language and need for interpreter reviewed:: Yes Do you feel safe going back to the place where you live?: Yes      Need for Family Participation in Patient Care: Yes (Comment) Care giver support system in place?: Yes (comment) Current home services: DME Criminal Activity/Legal Involvement Pertinent to Current Situation/Hospitalization: No -  Comment as needed  Activities of Daily Living   ADL Screening (condition at time of admission) Independently performs ADLs?: No Does the patient have a NEW difficulty with bathing/dressing/toileting/self-feeding that is expected to last >3 days?: No Does the patient have a NEW difficulty with getting in/out of bed, walking, or climbing stairs that is expected to last >3 days?: No Does the patient have a NEW difficulty with communication that is expected to last >3 days?: No Is the patient deaf or have difficulty hearing?: No Does the patient have difficulty seeing, even when wearing glasses/contacts?: No Does the patient have difficulty concentrating, remembering, or making decisions?: Yes  Permission Sought/Granted   Permission granted to share information with : Yes, Verbal Permission Granted  Share Information with NAME: Son Baldo Ash  Permission granted to share info w AGENCY: [facility]        Emotional Assessment Appearance:: Appears stated age Attitude/Demeanor/Rapport: Engaged Affect (typically observed): Accepting Orientation: : Oriented to Self, Oriented to Place, Oriented to  Time, Oriented to Situation Alcohol / Substance Use: Not Applicable Psych Involvement: No (comment)  Admission diagnosis:  Recurrent biliary colic [K80.50] Cholelithiasis [K80.20] History of dementia [Z86.59] Failure of outpatient treatment [Z78.9] Anxiety with restlessness [F41.9, R45.1] Patient Active Problem List   Diagnosis Date Noted   Failure of outpatient treatment 04/06/2023   Anxiety with restlessness 04/06/2023   Recurrent biliary colic 04/05/2023   History of dementia 04/05/2023   Cholelithiasis 04/04/2023   Hallux  malleus 02/24/2022   Iron deficiency anemia 01/06/2022   Cognitive impairment 12/29/2021   Gait abnormality 12/29/2021   History of laceration of skin 08/22/2021   Low-level of literacy 07/03/2021   Right bundle branch block 03/04/2021   Abnormal gait 12/20/2020    Chronic obstructive pulmonary disease, unspecified (HCC) 12/20/2020   Chronic pain 12/20/2020   Essential hypertension 12/20/2020   Gastroesophageal reflux disease 12/20/2020   Hardening of the aorta (main artery of the heart) (HCC) 12/20/2020   Localized, primary osteoarthritis of shoulder region 12/20/2020   Non-Hodgkin lymphoma, unspecified, lymph nodes of head, face, and neck (HCC) 12/20/2020   Peripheral neuropathy 12/20/2020   History of fall 12/20/2020   Postmenopausal state 12/20/2020   Pure hypercholesterolemia 12/20/2020   Major depression, single episode 12/20/2020   Thrombocytopenia (HCC) 12/20/2020   Tobacco user 12/20/2020   Vitamin D deficiency 12/20/2020   Deficiency anemia 12/05/2020   Recurrent falls 11/16/2019   Muscle weakness 05/18/2019   Lumbar radiculopathy 05/18/2019   Hypotension 11/15/2018   Cholesteatoma of external auditory canal, left 06/28/2018   Presbycusis of both ears 06/10/2018   Muscle strain of left shoulder 04/13/2018   Left shoulder pain 02/08/2018   History of bladder cancer 02/08/2018   Anemia, chronic disease 07/27/2017   Ear itching 12/14/2016   Bilateral impacted cerumen 11/12/2016   Pain in right wrist 01/12/2016   Stiffness of right wrist joint 01/12/2016   Closed fracture of lower end of right radius with routine healing 12/30/2015   Weight loss 11/19/2015   Chronic constipation 11/19/2015   Other specified hypothyroidism 11/14/2015   DJD (degenerative joint disease) of knee 06/19/2013   Right knee DJD    Hyperlipidemia    Thyroid disease    Chest pain 12/26/2012   Unstable angina (HCC) 12/03/2012   Osteoporosis 12/03/2012   Palpitations 12/03/2012   Dyslipidemia 12/03/2012   History of breast cancer in female 07/06/2011   History of non-Hodgkin's lymphoma 07/06/2011   Hypothyroid 07/06/2011   Chronic ITP (idiopathic thrombocytopenia) (HCC) 07/06/2011   PCP:  Fatima Sanger, FNP Pharmacy:   [facility]  [id number] - [address] [place] - [address] [place] [address] Phone: [phone] Fax: [phone]     Social Determinants of Health (SDOH) Social History: SDOH Screenings   Food Insecurity: Patient Unable To Answer (04/04/2023)  Housing: Patient Unable To Answer (04/04/2023)  Transportation Needs: Patient Unable To Answer (04/04/2023)  Utilities: Patient Unable To Answer (04/04/2023)  Tobacco Use: Medium Risk (04/04/2023)   SDOH Interventions:     Readmission Risk Interventions     No data to display

## 2023-04-06 NOTE — Discharge Instructions (Signed)
Follow up with your primary care doctor in about 1-2 weeks to discuss your hospital stay. They can refer you to GI if you are having further pain or concerns about your abdomen.  Your imaging this admission did not reveal any blockages in your bile drainage system or gallbladder and surgery was not recommended at this time

## 2023-04-06 NOTE — Evaluation (Signed)
Physical Therapy Evaluation Patient Details Name: Darlene [name] MRN: [id number] DOB: 04-06-1935 Today's Date: 04/06/2023  History of Present Illness  88 y.o. female presents to [facility] hospital on 04/04/2023 with R flank and chest wall pain after recent diagnosis of gallstones. PMH includes dementia, breast CA, HTN, non-Hodgkin's lymphoma, COPD, hypothyroidism, GERD, HLD, chronic ITP.  Clinical Impression  Pt received in recliner and agreeable to PT session. The pt reports to PT with deficits in balance, mobility, and activity tolerance. The pt was able to ambulate short household distances in the room with the use of a RW and CGA. The required frequent verbal cues for hand placement on the RW. The pt required minA for STS transfer to initiate standing. Pt had urinary incontinence during transition of standing, pt son reports this happens at home when standing. PT recommends short term physical therapy services to address problem list below; however pt and her son prefer to discharge home with increased caregiver support, if they can arrange it. The pt will continue to benefit from skilled PT to address remaining functional deficits.      If plan is discharge home, recommend the following: A little help with walking and/or transfers;A little help with bathing/dressing/bathroom;Assistance with cooking/housework;Assist for transportation   Can travel by private vehicle   Yes    Equipment Recommendations BSC/3in1  Recommendations for Other Services       Functional Status Assessment Patient has had a recent decline in their functional status and demonstrates the ability to make significant improvements in function in a reasonable and predictable amount of time.     Precautions / Restrictions Precautions Precautions: Fall Precaution Comments: Pt reports falling ~4weeks ago in the bathroom at home Restrictions Weight Bearing Restrictions: No      Mobility  Bed Mobility                General bed mobility comments: Pt recieved in recliner    Transfers Overall transfer level: Needs assistance Equipment used: Rolling walker (2 wheels) Transfers: Sit to/from Stand Sit to Stand: Mod assist (ModA to initiate standing and hand placement on the RW)                Ambulation/Gait Ambulation/Gait assistance: Contact guard assist Gait Distance (Feet): 35 Feet Assistive device: Rolling walker (2 wheels) Gait Pattern/deviations: Step-to pattern, Decreased stride length, Trunk flexed Gait velocity: slow Gait velocity interpretation: <1.31 ft/sec, indicative of household ambulator   General Gait Details: Pt requires frequent VC for hand placement on the RW while ambulating  Stairs            Wheelchair Mobility     Tilt Bed    Modified Rankin (Stroke Patients Only)       Balance Overall balance assessment: Needs assistance Sitting-balance support: Single extremity supported, Feet supported Sitting balance-Leahy Scale: Fair     Standing balance support: Bilateral upper extremity supported, During functional activity, Reliant on assistive device for balance Standing balance-Leahy Scale: Poor Standing balance comment: Required PT assistance to initiate standing                             Pertinent Vitals/Pain Pain Assessment Pain Assessment: Faces Faces Pain Scale: Hurts a little bit Pain Location: R chest wall Pain Descriptors / Indicators: Sore Pain Intervention(s): Monitored during session    Home Living Family/patient expects to be discharged to:: Private residence Living Arrangements: Alone Available Help at Discharge: Neighbor;Personal care attendant;Available PRN/intermittently  Type of Home: House Home Access: Stairs to enter Entrance Stairs-Rails: None Entrance Stairs-Number of Steps: 1   Home Layout: One level Home Equipment: Agricultural consultant (2 wheels);Cane - single point;Shower seat;Grab bars - tub/shower      Prior  Function Prior Level of Function : Needs assist;History of Falls (last six months)       Physical Assist : ADLs (physical)     Mobility Comments: ambulates with support of RW ADLs Comments: Pt receives help with grocery shopping and driving from neighbor     Extremity/Trunk Assessment   Upper Extremity Assessment Upper Extremity Assessment: Generalized weakness    Lower Extremity Assessment Lower Extremity Assessment: Generalized weakness    Cervical / Trunk Assessment Cervical / Trunk Assessment: Kyphotic  Communication   Communication Communication: Hearing impairment Cueing Techniques: Verbal cues;Tactile cues;Visual cues  Cognition Arousal: Alert Behavior During Therapy: WFL for tasks assessed/performed Overall Cognitive Status: Impaired/Different from baseline Area of Impairment: Memory                     Memory: Decreased short-term memory, Decreased recall of precautions (Pt repeats the same information frequently)         General Comments: Pt was able to give history and follow commands consistently        General Comments General comments (skin integrity, edema, etc.): Pt reports she will have caregiver assistance from 12p-8p everyday.    Exercises     Assessment/Plan    PT Assessment Patient needs continued PT services  PT Problem List Decreased strength;Decreased activity tolerance;Decreased balance;Decreased mobility;Decreased coordination       PT Treatment Interventions DME instruction;Gait training;Stair training;Functional mobility training;Therapeutic activities;Therapeutic exercise;Balance training    PT Goals (Current goals can be found in the Care Plan section)  Acute Rehab PT Goals Patient Stated Goal: to get better PT Goal Formulation: With patient Time For Goal Achievement: 04/20/23 Potential to Achieve Goals: Good    Frequency Min 1X/week     Co-evaluation               AM-PAC PT "6 Clicks" Mobility   Outcome Measure Help needed turning from your back to your side while in a flat bed without using bedrails?: A Little Help needed moving from lying on your back to sitting on the side of a flat bed without using bedrails?: A Lot Help needed moving to and from a bed to a chair (including a wheelchair)?: A Lot Help needed standing up from a chair using your arms (e.g., wheelchair or bedside chair)?: A Little Help needed to walk in hospital room?: A Little Help needed climbing 3-5 steps with a railing? : Total 6 Click Score: 14    End of Session Equipment Utilized During Treatment: Gait belt Activity Tolerance: Patient tolerated treatment well Patient left: in chair;with call bell/phone within reach;with family/visitor present Nurse Communication: Mobility status PT Visit Diagnosis: Unsteadiness on feet (R26.81);Repeated falls (R29.6);Muscle weakness (generalized) (M62.81);History of falling (Z91.81);Pain Pain - Right/Left: Right Pain - part of body:  (Chest wall)    Time: 8119-1478 PT Time Calculation (min) (ACUTE ONLY): 34 min   Charges:   PT Evaluation $PT Eval Low Complexity: 1 Low   PT General Charges $$ ACUTE PT VISIT: 1 Visit       Caryl Comes, SPT Acute Rehabilitation Office Phone [phone]   Caryl Comes 04/06/2023, 4:16 PM

## 2023-04-06 NOTE — Discharge Summary (Incomplete)
Physician Discharge Summary  Patient: ANTWONETTE STETSER AOZ:[id number] DOB: 1935/02/06   Code Status: Full Code Admit date: 04/04/2023 Discharge date: 04/06/2023 Disposition: Home health, PT and OT PCP: Fatima Sanger, FNP  Recommendations for Outpatient Follow-up:  Follow up with PCP within 1-2 weeks Regarding general hospital follow up and preventative care Recommend    Discharge Diagnoses:  Principal Problem:   Cholelithiasis Active Problems:   Hypothyroid   Hyperlipidemia   Essential hypertension   Cognitive impairment   Recurrent biliary colic   History of dementia  Brief Hospital Course Summary: Pt ***  Discharge Condition: {DISCHARGE CONDITION:19696}, improved Recommended discharge diet: {Discharge NGEX:[id number]}  Consultations: ***  Procedures/Studies: ***  Discharge Instructions     Face-to-face encounter (required for Medicare/Medicaid patients)   Complete by: As directed    I Leeroy Bock certify that this patient is under my care and that I, or a nurse practitioner or physician's assistant working with me, had a face-to-face encounter that meets the physician face-to-face encounter requirements with this patient on 04/06/2023. The encounter with the patient was in whole, or in part for the following medical condition(s) which is the primary reason for home health care (List medical condition): physical deconditioning   The encounter with the patient was in whole, or in part, for the following medical condition, which is the primary reason for home health care: physical deconditioning   I certify that, based on my findings, the following services are medically necessary home health services: Physical therapy   Reason for Medically Necessary Home Health Services: Therapy- Therapeutic Exercises to Increase Strength and Endurance   My clinical findings support the need for the above services: Cognitive impairments, dementia, or mental confusion  that  make it unsafe to leave home   Further, I certify that my clinical findings support that this patient is homebound due to: Mental confusion   Home Health   Complete by: As directed    To provide the following care/treatments:  PT OT        Allergies as of 04/06/2023       Reactions   Oxycodone Other (See Comments)   Made her depressed   Oxycodone Hcl    Other reaction(s): depressed feeling   Sulfamethoxazole-trimethoprim    Other reaction(s): Unknown   Codeine Itching          Doxycycline Swelling, Other (See Comments)   Burns throat also   Meperidine And Related Itching, Rash   Makes cheeks red also   Sulfa Antibiotics Itching   Sulfacetamide Sodium Itching        Medication List     STOP taking these medications    amoxicillin 500 MG capsule Commonly known as: AMOXIL   amoxicillin-clavulanate 875-125 MG tablet Commonly known as: AUGMENTIN   azithromycin 250 MG tablet Commonly known as: ZITHROMAX   oxyCODONE-acetaminophen 5-325 MG tablet Commonly known as: PERCOCET/ROXICET       TAKE these medications    acetaminophen 500 MG tablet Commonly known as: TYLENOL Take 1 tablet (500 mg total) by mouth every 6 (six) hours as needed for mild pain.   aspirin 81 MG tablet Take 81 mg by mouth daily.   betamethasone dipropionate 0.05 % cream   cholecalciferol 1000 units tablet Commonly known as: VITAMIN D Take 1,000 Units by mouth daily.   citalopram 40 MG tablet Commonly known as: CELEXA Take 40 mg by mouth daily.   Clobetasol Propionate 0.05 % shampoo   Co Q 10 100  MG Caps See admin instructions.   diclofenac Sodium 1 % Gel Commonly known as: VOLTAREN   ergocalciferol 1.25 MG (50000 UT) capsule Commonly known as: VITAMIN D2   esomeprazole 40 MG capsule Commonly known as: NEXIUM Take 40 mg by mouth 2 (two) times daily before a meal.   famciclovir 125 MG tablet Commonly known as: FAMVIR   Iron (Ferrous Sulfate) 325 (65 Fe) MG Tabs Take 1  tablet by mouth every other day. What changed: when to take this   levothyroxine 75 MCG tablet Commonly known as: SYNTHROID Take 75 mcg by mouth daily before breakfast.   lidocaine 5 % Commonly known as: Lidoderm Place 1 patch onto the skin daily. Remove & Discard patch within 12 hours or as directed by MD   lovastatin 20 MG tablet Commonly known as: MEVACOR TAKE 1 TABLET BY MOUTH ONCEDAILY AT BEDTIME   magnesium oxide 400 MG tablet Commonly known as: MAG-OX Take 400 mg by mouth daily.   methocarbamol 500 MG tablet Commonly known as: ROBAXIN Take 1 tablet (500 mg total) by mouth every 8 (eight) hours as needed for muscle spasms.   metoprolol succinate 25 MG 24 hr tablet Commonly known as: TOPROL-XL Take 25 mg by mouth daily.   mirabegron ER 25 MG Tb24 tablet Commonly known as: MYRBETRIQ Take 25 mg by mouth daily.   multivitamins ther. w/minerals Tabs tablet Take 1 tablet by mouth daily.   nystatin-triamcinolone ointment Commonly known as: MYCOLOG Apply 1 Application topically 2 (two) times daily.   ondansetron 4 MG disintegrating tablet Commonly known as: ZOFRAN-ODT Take 1 tablet (4 mg total) by mouth every 8 (eight) hours as needed for nausea or vomiting.   Polyethyl Glycol-Propyl Glycol 0.4-0.3 % Soln Apply 2 drops to eye 2 (two) times daily as needed (for dry eyes).   pregabalin 50 MG capsule Commonly known as: LYRICA Take 50 mg by mouth 2 (two) times daily.   PROBIOTIC PO Take 1 tablet by mouth daily.   traMADol 50 MG tablet Commonly known as: ULTRAM   VITAMIN B-12 PO Take 1,000 mcg by mouth daily.         Subjective   Pt reports ***  All questions and concerns were addressed at time of discharge.  Objective  Blood pressure 121/68, pulse 77, temperature 98.1 F (36.7 C), temperature source Oral, resp. rate 18, height 5\' 2"  (1.575 m), weight 74.8 kg, SpO2 95%.   General: Pt is alert, awake, not in acute distress Cardiovascular: RRR, S1/S2 +,  no rubs, no gallops Respiratory: CTA bilaterally, no wheezing, no rhonchi Abdominal: Soft, NT, ND, bowel sounds + Extremities: no edema, no cyanosis  The results of significant diagnostics from this hospitalization (including imaging, microbiology, ancillary and laboratory) are listed below for reference.   Imaging studies: NM Hepatobiliary Liver Func  Result Date: 04/05/2023 CLINICAL DATA:  RIGHT upper quadrant pain. Nausea and dry heaving. Cholelithiasis on ultrasound EXAM: NUCLEAR MEDICINE HEPATOBILIARY IMAGING TECHNIQUE: Sequential images of the abdomen were obtained out to 60 minutes following intravenous administration of radiopharmaceutical. RADIOPHARMACEUTICALS:  5.4 mCi Tc-8m  Choletec IV COMPARISON:  Abdominal ultrasound 04/04/2023 FINDINGS: Prompt uptake and biliary excretion of activity by the liver is seen. Gallbladder activity is visualized, consistent with patency of cystic duct. Biliary activity passes into small bowel, consistent with patent common bile duct. IMPRESSION: 1. Patent common bile duct and cystic duct. 2. No evidence of acute cholecystitis. Electronically Signed   By: Genevive Bi M.D.   On: 04/05/2023 12:10  US Abdomen Limited RUQ (LIVER/GB)  Result Date: 04/04/2023 CLINICAL DATA:  Abdominal pain EXAM: ULTRASOUND ABDOMEN LIMITED RIGHT UPPER QUADRANT COMPARISON:  Ultrasound abdomen dated 03/29/2019 FINDINGS: Gallbladder: No wall thickening visualized. Again seen is 5 mm adherent nonshadowing echogenicity within the gallbladder body, likely a nonshadowing stone. No sonographic Murphy sign noted by sonographer. Common bile duct: Diameter: 4 mm Liver: No focal lesion identified. Within normal limits in parenchymal echogenicity. Portal vein is patent on color Doppler imaging with normal direction of blood flow towards the liver. Other: None. IMPRESSION: Cholelithiasis without acute cholecystitis. Electronically Signed   By: Agustin Cree M.D.   On: 04/04/2023 12:54   US  Abdomen Limited RUQ (LIVER/GB)  Result Date: 03/29/2023 CLINICAL DATA:  Right upper quadrant pain EXAM: ULTRASOUND ABDOMEN LIMITED RIGHT UPPER QUADRANT COMPARISON:  CT 03/29/2023 FINDINGS: Gallbladder: Small stone.  Normal wall thickness.  Negative sonographic Murphy Common bile duct: Diameter: 2.7 mm Liver: No focal lesion identified. Within normal limits in parenchymal echogenicity. Portal vein is patent on color Doppler imaging with normal direction of blood flow towards the liver. Other: None. IMPRESSION: Cholelithiasis without sonographic evidence for acute cholecystitis. Electronically Signed   By: Jasmine Pang M.D.   On: 03/29/2023 21:33   CT Angio Abd/Pel W and/or Wo Contrast  Result Date: 03/29/2023 CLINICAL DATA:  Acute mesenteric ischemia EXAM: CTA ABDOMEN AND PELVIS WITHOUT AND WITH CONTRAST TECHNIQUE: Multidetector CT imaging of the abdomen and pelvis was performed using the standard protocol during bolus administration of intravenous contrast. Multiplanar reconstructed images and MIPs were obtained and reviewed to evaluate the vascular anatomy. RADIATION DOSE REDUCTION: This exam was performed according to the departmental dose-optimization program which includes automated exposure control, adjustment of the mA and/or kV according to patient size and/or use of iterative reconstruction technique. CONTRAST:  75mL OMNIPAQUE IOHEXOL 350 MG/ML SOLN COMPARISON:  CT chest abdomen and pelvis 02/07/2022. Lumbar spine CT 03/17/2023. Thoracic spine CT 03/17/2023. FINDINGS: VASCULAR Aorta: Normal caliber aorta without aneurysm, dissection, vasculitis or significant stenosis. There are atherosclerotic calcifications throughout the aorta. Celiac: Patent without evidence of aneurysm, dissection, vasculitis or significant stenosis. SMA: Patent without evidence of aneurysm, dissection, vasculitis or significant stenosis. Renals: Both renal arteries are patent without evidence of aneurysm, dissection,  vasculitis, fibromuscular dysplasia or significant stenosis. There are accessory bilateral renal arteries. IMA: Patent without evidence of aneurysm, dissection, vasculitis or significant stenosis. Inflow: Patent without evidence of aneurysm, dissection, vasculitis or significant stenosis. Proximal Outflow: Bilateral common femoral and visualized portions of the superficial and profunda femoral arteries are patent without evidence of aneurysm, dissection, vasculitis or significant stenosis. Veins: No significant venous abnormality. Review of the MIP images confirms the above findings. NON-VASCULAR Lower chest: There is atelectasis in the lung bases. The heart is enlarged. Hepatobiliary: No focal liver abnormality is seen. Small gallstones are seen. No gallbladder wall thickening or biliary dilatation. Pancreas: Unremarkable. No pancreatic ductal dilatation or surrounding inflammatory changes. Spleen: Normal in size without focal abnormality. Adrenals/Urinary Tract: Adrenal glands are unremarkable. Kidneys are normal, without renal calculi, focal lesion, or hydronephrosis. Bladder is unremarkable. Stomach/Bowel: Stomach is within normal limits. Appendix appears normal. No evidence of bowel wall thickening, distention, or inflammatory changes. Lymphatic: No enlarged lymph nodes are seen. Reproductive: Uterus and bilateral adnexa are unremarkable. Other: There is a small fat containing umbilical hernia. No ascites. Musculoskeletal: Severe T9 compression deformity is unchanged. There is new compression fracture of T10 which appears acute/subacute with 25% loss vertebral body height. IMPRESSION: 1. No evidence for  mesenteric ischemia. Aortic Atherosclerosis (ICD10-I70.0). NON-VASCULAR 1. No acute localizing process in the abdomen or pelvis. 2. Cholelithiasis. 3. Acute/subacute compression fracture of T10 with 25% loss vertebral body height. 4. Stable severe T9 compression deformity. Electronically Signed   By: Darliss Cheney M.D.   On: 03/29/2023 19:13   DG Chest 2 View  Result Date: 03/17/2023 CLINICAL DATA:  Abnormal findings on CT of the spine. EXAM: CHEST - 2 VIEW COMPARISON:  Chest CT 02/07/2022. Thoracic spine CT from the same day. FINDINGS: Hazy density at the right base without clear volume loss, localizing to the lower lobe on the lateral view. No edema, effusion, or pneumothorax. Thoracic fractures as described on dedicated contemporaneous CT. Remote left rib fractures with healed deformity. Postoperative right axilla. IMPRESSION: Infiltrate at the right lung base, correlate for pneumonia symptoms. Followup PA and lateral chest X-ray is recommended in 3-4 weeks following trial of antibiotic therapy to ensure resolution. Electronically Signed   By: Tiburcio Pea M.D.   On: 03/17/2023 04:34   CT Thoracic Spine Wo Contrast  Result Date: 03/17/2023 CLINICAL DATA:  Spine fracture, thoracic, traumatic; Back trauma, no prior imaging (Age >= 16y). Back pain EXAM: CT THORACIC AND LUMBAR SPINE WITHOUT CONTRAST TECHNIQUE: Multidetector CT imaging of the thoracic and lumbar spine was performed without contrast. Multiplanar CT image reconstructions were also generated. RADIATION DOSE REDUCTION: This exam was performed according to the departmental dose-optimization program which includes automated exposure control, adjustment of the mA and/or kV according to patient size and/or use of iterative reconstruction technique. COMPARISON:  MRI thoracic spine 01/20/2020, MRI lumbar spine 12/23/2019, MRI lumbar spine 01/13/2022, CT chest abdomen pelvis 02/07/2022 FINDINGS: CT THORACIC SPINE FINDINGS Alignment: Normal. Vertebrae: Diffusely decreased bone density. Chronic slightly worsened superior endplate C5 mild compression fracture. Chronic stable T9 compression fracture with complete vertebral body height loss. No acute fracture or focal pathologic process. Paraspinal and other soft tissues: Negative. Disc levels:  Maintained. CT LUMBAR SPINE FINDINGS Segmentation: 5 lumbar type vertebrae. Alignment: Stable L3 on L4, L4 on L5, L5 on S1 grade 1 anterolisthesis. Vertebrae: Interval worsening of severe degenerative changes of the spine. Left L5-S1 pseudoarthrosis. No acute fracture or focal pathologic process. Paraspinal and other soft tissues: Negative. Disc levels: Multilevel intervertebral disc space vacuum phenomena. Other: Atherosclerotic plaque.  Right lower lobe airspace opacity. IMPRESSION: CT THORACIC SPINE IMPRESSION 1. Chronic slightly worsened superior endplate C5 mild compression fracture. 2. Otherwise no acute displaced fracture or traumatic listhesis of the thoracic spine. 3. Chronic stable T9 compression fracture with complete vertebral body height loss. CT LUMBAR SPINE IMPRESSION 1. No acute displaced fracture or traumatic listhesis of the lumbar spine. 2. Severe degenerative changes. Other imaging findings of potential clinical significance: 1. Aortic Atherosclerosis (ICD10-I70.0). 2. Right lower lobe airspace opacity could represent atelectasis versus infection/inflammation. Recommend correlation with chest x-ray PA and lateral view. Electronically Signed   By: Tish Frederickson M.D.   On: 03/17/2023 01:37   CT Lumbar Spine Wo Contrast  Result Date: 03/17/2023 CLINICAL DATA:  Spine fracture, thoracic, traumatic; Back trauma, no prior imaging (Age >= 16y). Back pain EXAM: CT THORACIC AND LUMBAR SPINE WITHOUT CONTRAST TECHNIQUE: Multidetector CT imaging of the thoracic and lumbar spine was performed without contrast. Multiplanar CT image reconstructions were also generated. RADIATION DOSE REDUCTION: This exam was performed according to the departmental dose-optimization program which includes automated exposure control, adjustment of the mA and/or kV according to patient size and/or use of iterative reconstruction technique. COMPARISON:  MRI thoracic  spine 01/20/2020, MRI lumbar spine 12/23/2019, MRI lumbar  spine 01/13/2022, CT chest abdomen pelvis 02/07/2022 FINDINGS: CT THORACIC SPINE FINDINGS Alignment: Normal. Vertebrae: Diffusely decreased bone density. Chronic slightly worsened superior endplate C5 mild compression fracture. Chronic stable T9 compression fracture with complete vertebral body height loss. No acute fracture or focal pathologic process. Paraspinal and other soft tissues: Negative. Disc levels: Maintained. CT LUMBAR SPINE FINDINGS Segmentation: 5 lumbar type vertebrae. Alignment: Stable L3 on L4, L4 on L5, L5 on S1 grade 1 anterolisthesis. Vertebrae: Interval worsening of severe degenerative changes of the spine. Left L5-S1 pseudoarthrosis. No acute fracture or focal pathologic process. Paraspinal and other soft tissues: Negative. Disc levels: Multilevel intervertebral disc space vacuum phenomena. Other: Atherosclerotic plaque.  Right lower lobe airspace opacity. IMPRESSION: CT THORACIC SPINE IMPRESSION 1. Chronic slightly worsened superior endplate C5 mild compression fracture. 2. Otherwise no acute displaced fracture or traumatic listhesis of the thoracic spine. 3. Chronic stable T9 compression fracture with complete vertebral body height loss. CT LUMBAR SPINE IMPRESSION 1. No acute displaced fracture or traumatic listhesis of the lumbar spine. 2. Severe degenerative changes. Other imaging findings of potential clinical significance: 1. Aortic Atherosclerosis (ICD10-I70.0). 2. Right lower lobe airspace opacity could represent atelectasis versus infection/inflammation. Recommend correlation with chest x-ray PA and lateral view. Electronically Signed   By: Tish Frederickson M.D.   On: 03/17/2023 01:37    Labs: Basic Metabolic Panel: Recent Labs  Lab 04/04/23 1145 04/06/23 0622  NA 135 138  K 3.4* 3.1*  CL 104 106  CO2 20* 23  GLUCOSE 107* 93  BUN 15 10  CREATININE 0.58 0.99  CALCIUM 9.7 9.1   CBC: Recent Labs  Lab 04/04/23 1145 04/06/23 0622  WBC 8.5 6.8  NEUTROABS 6.0  --   HGB  12.9 13.4  HCT 39.7 41.3  MCV 90.4 90.8  PLT 129* 144*   Microbiology: ***  Time coordinating discharge: Over 30 minutes  Leeroy Bock, MD  [facility] 04/06/2023, 3:40 PM

## 2023-04-07 ENCOUNTER — Telehealth: Payer: Self-pay | Admitting: *Deleted

## 2023-04-07 NOTE — Transitions of Care (Post Inpatient/ED Visit) (Signed)
   04/07/2023  Name: Darlene [name] MRN: [id number] DOB: 1934-05-10  Today's TOC FU Call Status: Today's TOC FU Call Status:: Unsuccessful Call (1st Attempt) Unsuccessful Call (1st Attempt) Date: 04/07/23  Attempted to reach the patient regarding the most recent Inpatient/ED visit.  Follow Up Plan: Additional outreach attempts will be made to reach the patient to complete the Transitions of Care (Post Inpatient/ED visit) call.   Gean Maidens BSN RN Population Health- Transition of Care Team.  [facility] [phone]

## 2023-04-08 ENCOUNTER — Telehealth: Payer: Self-pay

## 2023-04-08 DIAGNOSIS — I10 Essential (primary) hypertension: Secondary | ICD-10-CM | POA: Diagnosis not present

## 2023-04-08 DIAGNOSIS — I4510 Unspecified right bundle-branch block: Secondary | ICD-10-CM | POA: Diagnosis not present

## 2023-04-08 DIAGNOSIS — D693 Immune thrombocytopenic purpura: Secondary | ICD-10-CM | POA: Diagnosis not present

## 2023-04-08 DIAGNOSIS — K5900 Constipation, unspecified: Secondary | ICD-10-CM | POA: Diagnosis not present

## 2023-04-08 DIAGNOSIS — Z8551 Personal history of malignant neoplasm of bladder: Secondary | ICD-10-CM | POA: Diagnosis not present

## 2023-04-08 DIAGNOSIS — M5416 Radiculopathy, lumbar region: Secondary | ICD-10-CM | POA: Diagnosis not present

## 2023-04-08 DIAGNOSIS — F329 Major depressive disorder, single episode, unspecified: Secondary | ICD-10-CM | POA: Diagnosis not present

## 2023-04-08 DIAGNOSIS — F0283 Dementia in other diseases classified elsewhere, unspecified severity, with mood disturbance: Secondary | ICD-10-CM | POA: Diagnosis not present

## 2023-04-08 DIAGNOSIS — E039 Hypothyroidism, unspecified: Secondary | ICD-10-CM | POA: Diagnosis not present

## 2023-04-08 DIAGNOSIS — D509 Iron deficiency anemia, unspecified: Secondary | ICD-10-CM | POA: Diagnosis not present

## 2023-04-08 DIAGNOSIS — G8929 Other chronic pain: Secondary | ICD-10-CM | POA: Diagnosis not present

## 2023-04-08 DIAGNOSIS — H6123 Impacted cerumen, bilateral: Secondary | ICD-10-CM | POA: Diagnosis not present

## 2023-04-08 DIAGNOSIS — M810 Age-related osteoporosis without current pathological fracture: Secondary | ICD-10-CM | POA: Diagnosis not present

## 2023-04-08 DIAGNOSIS — K219 Gastro-esophageal reflux disease without esophagitis: Secondary | ICD-10-CM | POA: Diagnosis not present

## 2023-04-08 DIAGNOSIS — E7800 Pure hypercholesterolemia, unspecified: Secondary | ICD-10-CM | POA: Diagnosis not present

## 2023-04-08 DIAGNOSIS — M19019 Primary osteoarthritis, unspecified shoulder: Secondary | ICD-10-CM | POA: Diagnosis not present

## 2023-04-08 DIAGNOSIS — M2030 Hallux varus (acquired), unspecified foot: Secondary | ICD-10-CM | POA: Diagnosis not present

## 2023-04-08 DIAGNOSIS — H9113 Presbycusis, bilateral: Secondary | ICD-10-CM | POA: Diagnosis not present

## 2023-04-08 DIAGNOSIS — Z96651 Presence of right artificial knee joint: Secondary | ICD-10-CM | POA: Diagnosis not present

## 2023-04-08 DIAGNOSIS — Z860100 Personal history of colon polyps, unspecified: Secondary | ICD-10-CM | POA: Diagnosis not present

## 2023-04-08 DIAGNOSIS — G629 Polyneuropathy, unspecified: Secondary | ICD-10-CM | POA: Diagnosis not present

## 2023-04-08 DIAGNOSIS — Z853 Personal history of malignant neoplasm of breast: Secondary | ICD-10-CM | POA: Diagnosis not present

## 2023-04-08 DIAGNOSIS — J449 Chronic obstructive pulmonary disease, unspecified: Secondary | ICD-10-CM | POA: Diagnosis not present

## 2023-04-08 DIAGNOSIS — K8070 Calculus of gallbladder and bile duct without cholecystitis without obstruction: Secondary | ICD-10-CM | POA: Diagnosis not present

## 2023-04-08 DIAGNOSIS — H9190 Unspecified hearing loss, unspecified ear: Secondary | ICD-10-CM | POA: Diagnosis not present

## 2023-04-08 NOTE — Transitions of Care (Post Inpatient/ED Visit) (Signed)
04/08/2023  Name: Darlene [name] MRN: [id number] DOB: 1934/08/24  Today's TOC FU Call Status: Today's TOC FU Call Status:: Successful TOC FU Call Completed TOC FU Call Complete Date: 04/08/23 Patient's Name and Date of Birth confirmed.  Transition Care Management Follow-up Telephone Call Date of Discharge: 04/06/23 Discharge Facility: [facility]) Type of Discharge: Inpatient Admission Primary Inpatient Discharge Diagnosis:: Recurrent Biliary Colic, Failed Outpatient Treatment, Cholelithiasis How have you been since you were released from the hospital?: Better ("I am doing much better, the pain is gone now but it was worse than giving birth when it started". "I have stones in my gallbladder and have to watch what I eat") Any questions or concerns?: No  Items Reviewed: Did you receive and understand the discharge instructions provided?: Yes Medications obtained,verified, and reconciled?: Partial Review Completed Reason for Partial Mediation Review: Patient is very hard of hearing.  Partial review include most recent medication changes and part of prior home medications.  Patient will have medication list with her for our next telephone call. Any new allergies since your discharge?: No Dietary orders reviewed?: Yes Type of Diet Ordered:: Low Fat Diet Do you have support at home?: Yes People in Home:  (Patient has hired 24 hour caregivers in the home.  Has a daughter in [place], [place] in [place] and very supportive/involved neighbors.) Name of Support/Comfort Primary Source: Hired 24 hour caregivers in the home. Has a daughter in [place], Son in [place] and very supportive/involved neighbors.  Medications Reviewed Today: Medications Reviewed Today     Reviewed by Wyline Mood, RN (Case Manager) on 04/08/23 at 1646  Med List Status: <None>   Medication Order Taking? Sig Documenting Provider Last Dose Status Informant  acetaminophen (TYLENOL) 500 MG tablet [id number]  Yes Take 1 tablet (500 mg total) by mouth every 6 (six) hours as needed for mild pain. Tilden Fossa, MD Taking Active   aspirin 81 MG tablet [id number] Yes Take 81 mg by mouth daily. [provider] Taking Active Self  betamethasone dipropionate 0.05 % cream [id number]   [provider]  Active   cholecalciferol (VITAMIN D) 1000 UNITS tablet [id number]  Take 1,000 Units by mouth daily. [provider]  Active Self  citalopram (CELEXA) 40 MG tablet [id number] Yes Take 40 mg by mouth daily.  [provider] Taking Active Self           Med Note Dorisann Frames Apr 04, 2023  3:11 PM) LF 04/02/23 90d/s, before 01/03/23 90d/s  Clobetasol Propionate 0.05 % shampoo [id number] No   Patient not taking: Reported on 04/08/2023   [provider] Not Taking Active   Coenzyme Q10 (CO Q 10) 100 MG CAPS [id number] Yes See admin instructions. [provider] Taking Active   Cyanocobalamin (VITAMIN B-12 PO) [id number] Yes Take 1,000 mcg by mouth daily. [provider] Taking Active Self  diclofenac Sodium (VOLTAREN) 1 % GEL [id number] No   Patient not taking: Reported on 04/08/2023   [provider] Not Taking Active   ergocalciferol (VITAMIN D2) 1.25 MG (50000 UT) capsule [id number] Yes  [provider] Taking Active   esomeprazole (NEXIUM) 40 MG capsule [id number] Yes Take 40 mg by mouth 2 (two) times daily before a meal. [provider] Taking Active Self           Med Note Dorisann Frames Apr 04, 2023  3:25 PM) LF 04/14/22 90d/s  famciclovir (FAMVIR) 125 MG tablet [id number]  [provider]  Active   Iron, Ferrous Sulfate, 325 (65 Fe) MG TABS [id number] Yes Take 1 tablet by mouth every other day. Leeroy Bock, MD Taking Active   levothyroxine (SYNTHROID, LEVOTHROID) 75 MCG tablet [id number]  Take 75 mcg by mouth daily before breakfast.  [provider]  Active Self           Med Note Dorisann Frames Apr 04, 2023  3:15 PM) LF 02/04/23 90d/s  lidocaine (LIDODERM) 5 % [id number] No Place 1 patch onto the skin daily. Remove & Discard patch within 12 hours or as directed by MD  Patient not taking: Reported on 04/08/2023   Gloris Manchester, MD Not Taking Active            Med Note Dorisann Frames Apr 04, 2023  3:14 PM) LF 03/17/23 30d/s  lovastatin (MEVACOR) 20 MG tablet [id number]  TAKE 1 TABLET BY MOUTH ONCEDAILY AT BEDTIME [provider]  Active            Med Note Dorisann Frames Apr 04, 2023  3:16 PM) LF 02/04/23 90d/s  magnesium oxide (MAG-OX) 400 MG tablet [id number] Yes Take 400 mg by mouth daily. [provider] Taking Active Self  methocarbamol (ROBAXIN) 500 MG tablet [id number] Yes Take 1 tablet (500 mg total) by mouth every 8 (eight) hours as needed for muscle spasms. Gloris Manchester, MD Taking Active            Med Note Dorisann Frames Apr 04, 2023  3:14 PM) LF 03/17/23 6d/s  metoprolol succinate (TOPROL-XL) 25 MG 24 hr tablet [id number] Yes Take 25 mg by mouth daily.  [provider] Taking Active Self           Med Note Dorisann Frames Apr 04, 2023  3:10 PM) LF 04/02/23 90d/s, before 11/24/22 90d/s  mirabegron ER (MYRBETRIQ) 25 MG TB24 tablet [id number]  Take 25 mg by mouth daily. [provider]  Active            Med Note Dorisann Frames Apr 04, 2023  3:22 PM) LF 12/09/22 90d/s  Multiple Vitamins-Minerals (MULTIVITAMINS THER. W/MINERALS) TABS [id number] Yes Take 1 tablet by mouth daily. [provider] Taking Active Self  nystatin-triamcinolone ointment [facility]) [id number] No Apply 1 Application topically 2 (two) times daily.  Patient not taking: Reported on 04/08/2023   [provider] Not Taking Active            Med Note Dorisann Frames Apr 04, 2023  3:24 PM) LF 01/05/23 15d/s  ondansetron (ZOFRAN-ODT) 4 MG disintegrating tablet [id number] Yes Take 1 tablet (4 mg total) by mouth every 8 (eight) hours as  needed for nausea or vomiting. Kommor, Wyn Forster, MD Taking Active            Med Note Dorisann Frames Apr 04, 2023  3:11 PM) LF 03/29/23 6d/s  Polyethyl Glycol-Propyl Glycol 0.4-0.3 % SOLN [id number] No Apply 2 drops to eye 2 (two) times daily as needed (for dry eyes).  Patient not taking: Reported on 04/08/2023   [provider] Not Taking Active Self  pregabalin (LYRICA) 50 MG capsule [id number] Yes Take 50 mg by mouth 2 (two) times daily. [provider] Taking Active            Med Note Dorisann Frames Apr 04, 2023  3:21 PM)  LF 12/20/22 90d/s  Probiotic Product (PROBIOTIC PO) [id number] Yes Take 1 tablet by mouth daily. [provider] Taking Active   traMADol Janean Sark) 50 MG tablet [id number] Yes  [provider] Taking Active             Home Care and Equipment/Supplies: Were Home Health Services Ordered?: Yes Name of Home Health Agency:: [facility] Has Agency set up a time to come to your home?: Yes First Home Health Visit Date: 04/08/23 Any new equipment or medical supplies ordered?: Yes Were you able to get the equipment/medical supplies?: Yes (3 in 1 commode) Do you have any questions related to the use of the equipment/supplies?: No  Functional Questionnaire: Do you need assistance with bathing/showering or dressing?: Yes (Patient reports recent balance issues with couple falls in past 3-4 weeks.  Hired caregivers provide stand by supervision d/t balance deficits and assist with preparing bath water and retrieving clothing.) Do you need assistance with meal preparation?: Yes Do you need assistance with eating?: No Do you have difficulty maintaining continence: Yes (occassional/positional urinary incontinence) Do you need assistance with getting out of bed/getting out of a chair/moving?: Yes (stand by assist w/supervision for balance and support) Do you have difficulty managing or taking your medications?: No  Follow  up appointments reviewed: PCP Follow-up appointment confirmed?: Yes Date of PCP follow-up appointment?: 04/13/23 Follow-up Provider: [facility] Follow-up appointment confirmed?: NA Do you need transportation to your follow-up appointment?: No (My daughter drives me to appointments) Do you understand care options if your condition(s) worsen?: Yes-patient verbalized understanding  SDOH Interventions Today    Flowsheet Row Most Recent Value  SDOH Interventions   Food Insecurity Interventions Intervention Not Indicated  Housing Interventions Intervention Not Indicated  Transportation Interventions Intervention Not Indicated  [Family provide all of patient's transportation]  Utilities Interventions Intervention Not Indicated       Goals Addressed             This Visit's Progress    Transition Of Care       Current Barriers:  Chronic Disease Management support and education needs related to Gallstones  Functional Mobility - High Fall Risk Medication management  Diet/Nutrition/Food Resources  Provider appointments  Home Health Physical Therapy services from [facility]   RNCM Clinical Goal(s):  Patient will work with the Care Management team over the next 30 days to address Transition of Care Barriers: Medication Management Diet/Nutrition/Food Resources Provider appointments Home Health services Functional/Safety through collaboration with RN Care manager, provider, and care team.   Interventions: Evaluation of current treatment plan related to  self management and patient's adherence to plan as established by provider  Transitions of Care:  New goal. Durable Medical Equipment (DME) delivery confirmed and education provided. Doctor Visits - Discussed the importance of keeping follow-up doctor appointments. Medication compliance - Take your medicines exactly as directed and do not skip doses.  Contact your physician for any questions  or concerns related to your medications.  Falls Interventions:  (Status:  New goal.) Short Term Goal Provided verbal education re: potential causes of falls and Fall prevention strategies. Discussed removing safety/fall hazards such as throw rugs and exposed power cords. Assessed for falls since last encounter.  Patient instructed to notify caregivers and doctor for recent fall and/or fall injuries. Provided verbal instruction to use walker as directed when performing physical activity such as getting up from bed, chair or walking. Verb. Good understanding.  Gallstones Intervention:  New goal Provided verbal diet instructions to limit fatty or greasy foods and eat plenty of foods containing fiber and calcium.  Foods high in fat contact may cause the gallbladder to work harder which may cause pain in your abdomen.  Patient verbalized understanding. Call your doctor immediately for severe pain in your abdomen ("belly"), shoulder or back area, nausea or vomiting or changes in the color of your skin or eyes ("yellowing").  Patient Goals/Self-Care Activities: Participate in Transition of Care Program/Attend [facility] scheduled calls Notify RN Care Manager of TOC call rescheduling needs Take all medications as prescribed Attend all scheduled provider appointments Call provider office for new concerns or questions   Follow Up Plan:  Telephone follow up appointment with care management team member scheduled for:  04/13/2023 at 3 PM. The patient has been provided with contact information for the care management team and has been advised to call with any health related questions or concerns.          Keturah Yerby Daphine Deutscher BSN, RN RN Care Manager   Transitions of Care [facility] - [facility] Direct Dial Number:  [phone]

## 2023-04-13 ENCOUNTER — Other Ambulatory Visit: Payer: Self-pay | Admitting: Surgery

## 2023-04-13 ENCOUNTER — Other Ambulatory Visit: Payer: Self-pay

## 2023-04-13 ENCOUNTER — Telehealth: Payer: Self-pay

## 2023-04-13 DIAGNOSIS — K8020 Calculus of gallbladder without cholecystitis without obstruction: Secondary | ICD-10-CM | POA: Diagnosis not present

## 2023-04-13 NOTE — Patient Outreach (Signed)
  Care Management  Transitions of Care Program Transitions of Care Post-discharge week 2  04/13/2023 Name: HELLEN BUPP MRN: [id number] DOB: 03-19-1935  Subjective: TICIA STEPTOE is a 88 y.o. year old female who is a primary care patient of Fatima Sanger, FNP. The Care Management team was unable to reach the patient by phone to assess and address transitions of care needs.   Plan: Additional outreach attempts will be made to reach the patient enrolled in the [facility] Program (Post Inpatient/ED Visit).  Rehanna Oloughlin Daphine Deutscher BSN, RN RN Care Manager   Transitions of Care [facility] - [facility] Direct Dial Number:  [phone]

## 2023-04-14 ENCOUNTER — Telehealth: Payer: Self-pay

## 2023-04-14 ENCOUNTER — Other Ambulatory Visit: Payer: Self-pay

## 2023-04-15 DIAGNOSIS — D693 Immune thrombocytopenic purpura: Secondary | ICD-10-CM | POA: Diagnosis not present

## 2023-04-15 DIAGNOSIS — F329 Major depressive disorder, single episode, unspecified: Secondary | ICD-10-CM | POA: Diagnosis not present

## 2023-04-15 DIAGNOSIS — K8070 Calculus of gallbladder and bile duct without cholecystitis without obstruction: Secondary | ICD-10-CM | POA: Diagnosis not present

## 2023-04-15 DIAGNOSIS — E039 Hypothyroidism, unspecified: Secondary | ICD-10-CM | POA: Diagnosis not present

## 2023-04-15 DIAGNOSIS — J449 Chronic obstructive pulmonary disease, unspecified: Secondary | ICD-10-CM | POA: Diagnosis not present

## 2023-04-15 DIAGNOSIS — F0283 Dementia in other diseases classified elsewhere, unspecified severity, with mood disturbance: Secondary | ICD-10-CM | POA: Diagnosis not present

## 2023-04-15 NOTE — Patient Instructions (Signed)
Visit Information  Thank you for taking time to visit with me today. Please don't hesitate to contact me if I can be of assistance to you before our next scheduled telephone appointment.  Our next appointment is by telephone on 04/19/23 at 2 PM  Following is a copy of your care plan:   Goals Addressed             This Visit's Progress    Transition Of Care       Current Barriers:  Chronic Disease Management support and education needs related to Gallstones  Functional Mobility - High Fall Risk Medication management  Diet/Nutrition for Gallstone Provider appointments  Home Health Therapy services from [facility] Agency and paid 24 hour/7day caregivers in the hme.  RNCM Clinical Goal(s):  Patient will work with the Care Management team over the next 30 days to address Transition of Care Barriers: Medication Management Consume diet, foods low in fat content and rich in fruits and vegetables.  Try to stay away from the cream cheese, sweets, candy, bacon Provider appointments Home Health services Functional/Safety through collaboration with RN Care manager, provider, and care team.   Interventions: Evaluation of current treatment plan related to  self management and patient's adherence to plan as established by provider  Transitions of Care:  Goal on track:  Yes. Instruct in the safe use of durable medical equipment. Doctor Visits - Discussed the importance of keeping follow-up doctor appointments. Medication compliance - Spoke with patient's caregiver who prepares her weekly pill planner and reports patient is 100% compliant with her medications.  Falls Interventions:  (Status:  Goal on track:  Yes.) Short Term Goal Provided verbal education re: potential causes of falls and Fall prevention strategies. Home Health Physical Therapy and Occupational have removed or made recommendations for items to be removed to decrease patient likely of tripping over them. Assessed  for falls since last encounter.  Patient instructed to notify caregivers and doctor for recent fall and/or fall injuries. As of 12/12, the patient has not fallen. Provided verbal instruction to use walker as directed when performing physical activity such as getting up from bed, chair or walking. Verb. Good understanding.  Gallstones Intervention:  Goal on track:  Yes. Provided verbal on diet instructions to limit fatty or greasy foods and eat plenty of foods containing fiber and calcium.  Foods high in fat contact may cause the gallbladder to work harder which may cause pain in your abdomen.  Patient verbalized understanding. Call your doctor immediately for severe pain in your abdomen ("belly"), shoulder or back area, nausea or vomiting or changes in the color of your skin or eyes ("yellowing").  Patient Goals/Self-Care Activities: Participate in Transition of Care Program/Attend [facility] scheduled calls Notify RN Care Manager of TOC call rescheduling needs Take all medications as prescribed Attend all scheduled provider appointments Call provider office for new concerns or questions  Maintain a low fat  Follow Up Plan:  Telephone follow up appointment with care management team member scheduled for:  04/19/2023 at 2 PM. The patient has been provided with contact information for the care management team and has been advised to call with any health related questions or concerns.    Wyline Mood BSN, RN RN Care Manager   Transitions of Care [facility] - [facility] [place] Direct Dial Number:  [phone]         Patient verbalizes understanding of instructions and care plan provided today and agrees to view in MyChart.  Active MyChart status and patient understanding of how to access instructions and care plan via MyChart confirmed with patient.     Telephone follow up appointment with care management team member scheduled for: The patient has been provided with contact  information for the care management team and has been advised to call with any health related questions or concerns.   Please call the care guide team at [phone] if you need to cancel or reschedule your appointment.   Please call the Suicide and Crisis Lifeline: 988 call the [place] National Suicide Prevention Lifeline: 513-248-5299 or TTY: (513) 688-8602 TTY 306-828-8100) to talk to a trained counselor if you are experiencing a Mental Health or Behavioral Health Crisis or need someone to talk to.  Velencia Lenart Daphine Deutscher BSN, RN RN Care Manager   Transitions of Care [facility] - [facility] Health Direct Dial Number:  [phone]

## 2023-04-15 NOTE — Patient Outreach (Deleted)
{  [facility] TOC PROGRAM NOTES:30402}

## 2023-04-15 NOTE — Patient Outreach (Signed)
Care Management  Transitions of Care Program Transitions of Care Post-discharge week 2   04/15/2023 Name: Darlene [name] MRN: [id number] DOB: [birth year]  Subjective: Darlene [name] is a 88 y.o. year old female who is a primary care patient of Fatima Sanger, FNP. The Care Management team Engaged with patient Engaged with patient by telephone to assess and address transitions of care needs.   Consent to Services:  Patient was given information about care management services, agreed to services, and gave verbal consent to participate.   Assessment:           SDOH Interventions    Flowsheet Row Telephone from 04/08/2023 in [place] POPULATION HEALTH DEPARTMENT Office Visit from 07/19/2017 in [facility]  SDOH Interventions    Food Insecurity Interventions Intervention Not Indicated --  Housing Interventions Intervention Not Indicated --  Transportation Interventions Intervention Not Indicated  [Family provide all of patient's transportation] --  Utilities Interventions Intervention Not Indicated --  Depression Interventions/Treatment  -- Currently on Treatment        Goals Addressed             This Visit's Progress    Transition Of Care       Current Barriers:  Chronic Disease Management support and education needs related to Gallstones  Functional Mobility - High Fall Risk Medication management  Diet/Nutrition for Gallstone Provider appointments  Home Health Therapy services from [facility] Agency and paid 24 hour/7day caregivers in the hme.  RNCM Clinical Goal(s):  Patient will work with the Care Management team over the next 30 days to address Transition of Care Barriers: Medication Management Consume diet, foods low in fat content and rich in fruits and vegetables.  Try to stay away from the cream cheese, sweets, candy, bacon Provider appointments Home Health services Functional/Safety through collaboration with RN Care  manager, provider, and care team.   Interventions: Evaluation of current treatment plan related to  self management and patient's adherence to plan as established by provider  Transitions of Care:  Goal on track:  Yes. Instruct in the safe use of durable medical equipment. Doctor Visits - Discussed the importance of keeping follow-up doctor appointments. Medication compliance - Spoke with patient's caregiver who prepares her weekly pill planner and reports patient is 100% compliant with her medications.  Falls Interventions:  (Status:  Goal on track:  Yes.) Short Term Goal Provided verbal education re: potential causes of falls and Fall prevention strategies. Home Health Physical Therapy and Occupational have removed or made recommendations for items to be removed to decrease patient likely of tripping over them. Assessed for falls since last encounter.  Patient instructed to notify caregivers and doctor for recent fall and/or fall injuries. As of 12/12, the patient has not fallen. Provided verbal instruction to use walker as directed when performing physical activity such as getting up from bed, chair or walking. Verb. Good understanding.  Gallstones Intervention:  Goal on track:  Yes. Provided verbal on diet instructions to limit fatty or greasy foods and eat plenty of foods containing fiber and calcium.  Foods high in fat contact may cause the gallbladder to work harder which may cause pain in your abdomen.  Patient verbalized understanding. Call your doctor immediately for severe pain in your abdomen ("belly"), shoulder or back area, nausea or vomiting or changes in the color of your skin or eyes ("yellowing").  Patient Goals/Self-Care Activities: Participate in Transition of Care Program/Attend [facility] scheduled calls Notify RN  Care Manager of TOC call rescheduling needs Take all medications as prescribed Attend all scheduled provider appointments Call provider office for new concerns or  questions  Maintain a low fat  Follow Up Plan:  Telephone follow up appointment with care management team member scheduled for:  04/19/2023 at 2 PM. The patient has been provided with contact information for the care management team and has been advised to call with any health related questions or concerns.    Wyline Mood BSN, RN RN Care Manager   Transitions of Care [facility] - [facility] [place] Direct Dial Number:  [phone]         Plan: Telephone follow up appointment with care management team member scheduled for:  04/19/2023 @ 2 PM The patient has been provided with contact information for the care management team and has been advised to call with any health related questions or concerns.   Melodye Swor Daphine Deutscher BSN, RN RN Care Manager   Transitions of Care [facility] - [facility] Health Direct Dial Number:  [phone]

## 2023-04-16 DIAGNOSIS — F329 Major depressive disorder, single episode, unspecified: Secondary | ICD-10-CM | POA: Diagnosis not present

## 2023-04-16 DIAGNOSIS — E039 Hypothyroidism, unspecified: Secondary | ICD-10-CM | POA: Diagnosis not present

## 2023-04-16 DIAGNOSIS — K8070 Calculus of gallbladder and bile duct without cholecystitis without obstruction: Secondary | ICD-10-CM | POA: Diagnosis not present

## 2023-04-16 DIAGNOSIS — D693 Immune thrombocytopenic purpura: Secondary | ICD-10-CM | POA: Diagnosis not present

## 2023-04-16 DIAGNOSIS — J449 Chronic obstructive pulmonary disease, unspecified: Secondary | ICD-10-CM | POA: Diagnosis not present

## 2023-04-16 DIAGNOSIS — F0283 Dementia in other diseases classified elsewhere, unspecified severity, with mood disturbance: Secondary | ICD-10-CM | POA: Diagnosis not present

## 2023-04-19 ENCOUNTER — Ambulatory Visit (INDEPENDENT_AMBULATORY_CARE_PROVIDER_SITE_OTHER): Payer: Medicare Other | Admitting: Podiatry

## 2023-04-19 ENCOUNTER — Other Ambulatory Visit: Payer: Self-pay

## 2023-04-19 ENCOUNTER — Encounter: Payer: Self-pay | Admitting: Podiatry

## 2023-04-19 DIAGNOSIS — B351 Tinea unguium: Secondary | ICD-10-CM

## 2023-04-19 DIAGNOSIS — D696 Thrombocytopenia, unspecified: Secondary | ICD-10-CM | POA: Diagnosis not present

## 2023-04-19 DIAGNOSIS — M2031 Hallux varus (acquired), right foot: Secondary | ICD-10-CM | POA: Diagnosis not present

## 2023-04-19 DIAGNOSIS — M79676 Pain in unspecified toe(s): Secondary | ICD-10-CM | POA: Diagnosis not present

## 2023-04-19 NOTE — Patient Outreach (Signed)
Care Management  Transitions of Care Program Transitions of Care Post-discharge week 3   04/19/2023 Name: Darlene [name] MRN: [id number] DOB: [birth year]  Subjective: Darlene [name] is a 88 y.o. year old female who is a primary care patient of Fatima Sanger, FNP. The Care Management team Engaged with patient Engaged with patient by telephone to assess and address transitions of care needs.   Consent to Services:  Patient was given information about care management services, agreed to services, and gave verbal consent to participate.   Assessment:   Darlene [name] is in her talkative, jovial self this visit.  She denies any stomach pain or pain that shoots/radiates to her back; denies nause, vomiting or diarrhea.  Indicates she is trying to maintain her dietary restrictions but confesses it is hard at time.  Reinforced how a few simple dietary changes may prolong future gallbladder attackes.  Darlene [name] continues to receive 24hr/7day week caregivers and home health physical therapy in the home.  No acute needs identified at this time.       SDOH Interventions    Flowsheet Row Telephone from 04/08/2023 in [place] [facility]PART[facility] Office Visit from 07/19/2017 in [facility] Neurology  SDOH Interventions    Food Insecurity Interventions Intervention Not Indicated --  Housing Interventions Intervention Not Indicated --  Transportation Interventions Intervention Not Indicated  [Family provide all of patient's transportation] --  Utilities Interventions Intervention Not Indicated --  Depression Interventions/Treatment  -- Currently on Treatment        Goals Addressed             This Visit's Progress    Transition Of Care       Current Barriers:  Chronic Disease Management support and education needs related to Gallstones  Functional Mobility - High Fall Risk Medication management  Diet/Nutrition for Gallstone Provider appointments  Home Health  Therapy services from [facility] and paid 24 hour/7day caregivers in the hme.  RNCM Clinical Goal(s):  Patient will work with the Care Management team over the next 30 days to address Transition of Care Barriers: Medication Management Patient caregivers to help patient make good diet choices such as foods low in fat content and rich in fruits and vegetables.  Try to stay away from the cream cheese, sweets, candy, bacon Provider appointments Home Health services Functional/Safety through collaboration with RN Care manager, provider, and care team.   Interventions: Evaluation of current treatment plan related to  self management and patient's adherence to plan as established by provider  Transitions of Care:  Goal on track:  Yes. Instruct in the safe use of durable medical equipment. Reviewed/discussed importance of keeping upcoming doctor appointments.  Patient verbalized understanding.  Ongoing weekly HH Physical therapy visits  Falls Interventions:  (Status:  Goal on track:  Yes.) Short Term Goal Provided verbal education re: potential causes of falls, fall prevention strategies I.e. removal of drop cords, throw rugs. Home Health Physical Therapy and Occupational have removed or made recommendations for items to be removed to decrease patient likely of tripping over them. Assessed for falls since last encounter.  Patient instructed to notify caregivers and doctor for recent fall and/or fall injuries. As of 12/12, the patient has not fallen. Reinforce use of walker when  getting up from bed, chair or walking. Verb. Good understanding.  Gallstones Intervention:  Goal on track:  Yes. Reinforced dietary recommendations to help decrease potential for "gallstone flare-ups" I.e., limit fatty or greasy  foods and eat plenty of foods containing fiber and calcium.  Patient verbalized understanding but said her doctor told her she could eat anything at her last appointment.  Assess  for stomach pains or pain that may radiate between the shoulder blades or upper back - 12/16 - Patient denies any abdominal pains, nausea, or vomiting or general malaise. Call your doctor immediately for severe pain in your abdomen ("belly"), shoulder or back area, nausea or vomiting or changes in the color of your skin or eyes ("yellowing").  Patient Goals/Self-Care Activities: Participate in Transition of Care Program/Attend [facility] scheduled calls Notify RN Care Manager of TOC call rescheduling needs Take all medications as prescribed Attend all scheduled provider appointments Call provider office for new concerns or questions  Maintain a low fat  Follow Up Plan:  Telephone follow up appointment with care management team member scheduled for:  04/26/2023 at 1:30 PM. The patient has been provided with contact information for the care management team and has been advised to call with any health related questions or concerns.    Wyline Mood BSN, RN RN Care Manager   Transitions of Care VBCI - [facility] [place] Direct Dial Number:  [phone]         Plan: Telephone follow up appointment with care management team member scheduled for: 04/26/2023  at 1:30 PM. The patient has been provided with contact information for the care management team and has been advised to call with any health related questions or concerns.   Marilou Barnfield Daphine Deutscher BSN, RN RN Care Manager   Transitions of Care [facility]BCI - [facility] Direct Dial Number:  [phone]

## 2023-04-19 NOTE — Progress Notes (Signed)
This patient returns to the office for evaluation and treatment of long thick painful nails .  This patient is unable to trim her own nails since the patient cannot reach her feet.  Patient says the nails are painful walking and wearing his shoes.    She returns for preventive foot care services.  General Appearance  Alert, conversant and in no acute stress.  Vascular  Dorsalis pedis and posterior tibial  pulses are weakly  palpable  bilaterally.  Capillary return is within normal limits  bilaterally. Temperature is within normal limits  bilaterally.  Neurologic  Senn-Weinstein monofilament wire test within normal limits  bilaterally. Muscle power within normal limits bilaterally.  Nails Thick disfigured discolored nails with subungual debris  from hallux to fifth toes bilaterally. No evidence of bacterial infection or drainage bilaterally.  Orthopedic  No limitations of motion  feet .  No crepitus or effusions noted.  No bony pathology or digital deformities noted.  Hallux varus 1st MPJ and hallux malleus right foot.   Hammer toes 2,3 right.  Skin  normotropic skin with no porokeratosis noted bilaterally.  No signs of infections or ulcers noted.     Onychomycosis  Pain in toes right foot  Pain in toes left foot  Debridement  of nails  1-5  B/L with a nail nipper.  Nails were then filed using a dremel tool with no incidents.   RTC 4  months    Helane Gunther DPM

## 2023-04-19 NOTE — Patient Instructions (Signed)
Visit Information  Thank you for taking time to visit with me today. Please don't hesitate to contact me if I can be of assistance to you before our next scheduled telephone appointment.  Our next appointment is by telephone on 04/26/23 at 1:30 PM.  Following is a copy of your care plan:   Goals Addressed             This Visit's Progress    Transition Of Care       Current Barriers:  Chronic Disease Management support and education needs related to Gallstones  Functional Mobility - High Fall Risk Medication management  Diet/Nutrition for Gallstone Provider appointments  Home Health Therapy services from [facility] and paid 24 hour/7day caregivers in the hme.  RNCM Clinical Goal(s):  Patient will work with the Care Management team over the next 30 days to address Transition of Care Barriers: Medication Management Patient caregivers to help patient make good diet choices such as foods low in fat content and rich in fruits and vegetables.  Try to stay away from the cream cheese, sweets, candy, bacon Provider appointments Home Health services Functional/Safety through collaboration with RN Care manager, provider, and care team.   Interventions: Evaluation of current treatment plan related to  self management and patient's adherence to plan as established by provider  Transitions of Care:  Goal on track:  Yes. Instruct in the safe use of durable medical equipment. Reviewed/discussed importance of keeping upcoming doctor appointments.  Patient verbalized understanding.  Ongoing weekly HH Physical therapy visits  Falls Interventions:  (Status:  Goal on track:  Yes.) Short Term Goal Provided verbal education re: potential causes of falls, fall prevention strategies I.e. removal of drop cords, throw rugs. Home Health Physical Therapy and Occupational have removed or made recommendations for items to be removed to decrease patient likely of tripping over  them. Assessed for falls since last encounter.  Patient instructed to notify caregivers and doctor for recent fall and/or fall injuries. As of 12/12, the patient has not fallen. Reinforce use of walker when  getting up from bed, chair or walking. Verb. Good understanding.  Gallstones Intervention:  Goal on track:  Yes. Reinforced dietary recommendations to help decrease potential for "gallstone flare-ups" I.e., limit fatty or greasy foods and eat plenty of foods containing fiber and calcium.  Patient verbalized understanding but said her doctor told her she could eat anything at her last appointment.  Assess for stomach pains or pain that may radiate between the shoulder blades or upper back - 12/16 - Patient denies any abdominal pains, nausea, or vomiting or general malaise. Call your doctor immediately for severe pain in your abdomen ("belly"), shoulder or back area, nausea or vomiting or changes in the color of your skin or eyes ("yellowing").  Patient Goals/Self-Care Activities: Participate in Transition of Care Program/Attend [facility] scheduled calls Notify RN Care Manager of TOC call rescheduling needs Take all medications as prescribed Attend all scheduled provider appointments Call provider office for new concerns or questions  Maintain a low fat  Follow Up Plan:  Telephone follow up appointment with care management team member scheduled for:  04/26/2023 at 1:30 PM. The patient has been provided with contact information for the care management team and has been advised to call with any health related questions or concerns.    Darlene [name] Daphine Deutscher BSN, RN RN Care Manager   Transitions of Care VBCI - [facility] Direct Dial Number:  [phone]  The patient verbalized understanding of instructions, educational materials, and care plan provided today and DECLINED offer to receive copy of patient instructions, educational materials, and care plan.   Telephone  follow up appointment with care management team member scheduled for: The patient has been provided with contact information for the care management team and has been advised to call with any health related questions or concerns.   Please call the care guide team at [phone] if you need to cancel or reschedule your appointment.   Please call the Suicide and Crisis Lifeline: 988 go to [facility] [address], [place] [phone]) if you are experiencing a Mental Health or Behavioral Health Crisis or need someone to talk to.  Darlene [name] Daphine Deutscher BSN, RN RN Care Manager   Transitions of Care VBCI - [facility] Direct Dial Number:  [phone]

## 2023-04-20 DIAGNOSIS — J449 Chronic obstructive pulmonary disease, unspecified: Secondary | ICD-10-CM | POA: Diagnosis not present

## 2023-04-20 DIAGNOSIS — K8070 Calculus of gallbladder and bile duct without cholecystitis without obstruction: Secondary | ICD-10-CM | POA: Diagnosis not present

## 2023-04-20 DIAGNOSIS — D693 Immune thrombocytopenic purpura: Secondary | ICD-10-CM | POA: Diagnosis not present

## 2023-04-20 DIAGNOSIS — E039 Hypothyroidism, unspecified: Secondary | ICD-10-CM | POA: Diagnosis not present

## 2023-04-21 DIAGNOSIS — E039 Hypothyroidism, unspecified: Secondary | ICD-10-CM | POA: Diagnosis not present

## 2023-04-21 DIAGNOSIS — D693 Immune thrombocytopenic purpura: Secondary | ICD-10-CM | POA: Diagnosis not present

## 2023-04-21 DIAGNOSIS — Z6828 Body mass index (BMI) 28.0-28.9, adult: Secondary | ICD-10-CM | POA: Diagnosis not present

## 2023-04-21 DIAGNOSIS — J449 Chronic obstructive pulmonary disease, unspecified: Secondary | ICD-10-CM | POA: Diagnosis not present

## 2023-04-21 DIAGNOSIS — F329 Major depressive disorder, single episode, unspecified: Secondary | ICD-10-CM | POA: Diagnosis not present

## 2023-04-21 DIAGNOSIS — S22070A Wedge compression fracture of T9-T10 vertebra, initial encounter for closed fracture: Secondary | ICD-10-CM | POA: Diagnosis not present

## 2023-04-21 DIAGNOSIS — F0283 Dementia in other diseases classified elsewhere, unspecified severity, with mood disturbance: Secondary | ICD-10-CM | POA: Diagnosis not present

## 2023-04-21 DIAGNOSIS — K8070 Calculus of gallbladder and bile duct without cholecystitis without obstruction: Secondary | ICD-10-CM | POA: Diagnosis not present

## 2023-04-23 ENCOUNTER — Telehealth: Payer: Self-pay | Admitting: Physician Assistant

## 2023-04-23 DIAGNOSIS — D693 Immune thrombocytopenic purpura: Secondary | ICD-10-CM | POA: Diagnosis not present

## 2023-04-23 DIAGNOSIS — E039 Hypothyroidism, unspecified: Secondary | ICD-10-CM | POA: Diagnosis not present

## 2023-04-23 DIAGNOSIS — J449 Chronic obstructive pulmonary disease, unspecified: Secondary | ICD-10-CM | POA: Diagnosis not present

## 2023-04-23 DIAGNOSIS — F329 Major depressive disorder, single episode, unspecified: Secondary | ICD-10-CM | POA: Diagnosis not present

## 2023-04-23 DIAGNOSIS — F0283 Dementia in other diseases classified elsewhere, unspecified severity, with mood disturbance: Secondary | ICD-10-CM | POA: Diagnosis not present

## 2023-04-23 DIAGNOSIS — K8070 Calculus of gallbladder and bile duct without cholecystitis without obstruction: Secondary | ICD-10-CM | POA: Diagnosis not present

## 2023-04-26 ENCOUNTER — Other Ambulatory Visit: Payer: Self-pay

## 2023-04-26 ENCOUNTER — Ambulatory Visit: Payer: Medicare Other | Admitting: Physician Assistant

## 2023-04-26 DIAGNOSIS — E039 Hypothyroidism, unspecified: Secondary | ICD-10-CM | POA: Diagnosis not present

## 2023-04-26 DIAGNOSIS — D693 Immune thrombocytopenic purpura: Secondary | ICD-10-CM | POA: Diagnosis not present

## 2023-04-26 DIAGNOSIS — J449 Chronic obstructive pulmonary disease, unspecified: Secondary | ICD-10-CM | POA: Diagnosis not present

## 2023-04-26 DIAGNOSIS — K8070 Calculus of gallbladder and bile duct without cholecystitis without obstruction: Secondary | ICD-10-CM | POA: Diagnosis not present

## 2023-04-26 DIAGNOSIS — F0283 Dementia in other diseases classified elsewhere, unspecified severity, with mood disturbance: Secondary | ICD-10-CM | POA: Diagnosis not present

## 2023-04-26 DIAGNOSIS — F329 Major depressive disorder, single episode, unspecified: Secondary | ICD-10-CM | POA: Diagnosis not present

## 2023-04-30 ENCOUNTER — Other Ambulatory Visit: Payer: Self-pay

## 2023-04-30 NOTE — Patient Outreach (Signed)
  Care Management  Transitions of Care Program Transitions of Care Post-discharge week 4  04/30/2023 Name: Darlene [name] MRN: [id number] DOB: 1934/09/11  Subjective: Darlene [name] is a 87 y.o. year old female who is a primary care patient of Fatima Sanger, FNP. The Care Management team was unable to reach the patient by phone to assess and address transitions of care needs.   Plan: Additional outreach attempts will be made to reach the patient enrolled in the [facility] Program (Post Inpatient/ED Visit).  Guy Toney Daphine Deutscher BSN, RN RN Care Manager   Transitions of Care VBCI - [facility] Direct Dial Number:  [phone]

## 2023-05-03 DIAGNOSIS — E039 Hypothyroidism, unspecified: Secondary | ICD-10-CM | POA: Diagnosis not present

## 2023-05-03 DIAGNOSIS — F329 Major depressive disorder, single episode, unspecified: Secondary | ICD-10-CM | POA: Diagnosis not present

## 2023-05-03 DIAGNOSIS — J449 Chronic obstructive pulmonary disease, unspecified: Secondary | ICD-10-CM | POA: Diagnosis not present

## 2023-05-03 DIAGNOSIS — D693 Immune thrombocytopenic purpura: Secondary | ICD-10-CM | POA: Diagnosis not present

## 2023-05-03 DIAGNOSIS — F0283 Dementia in other diseases classified elsewhere, unspecified severity, with mood disturbance: Secondary | ICD-10-CM | POA: Diagnosis not present

## 2023-05-03 DIAGNOSIS — K8070 Calculus of gallbladder and bile duct without cholecystitis without obstruction: Secondary | ICD-10-CM | POA: Diagnosis not present

## 2023-05-05 DIAGNOSIS — K8070 Calculus of gallbladder and bile duct without cholecystitis without obstruction: Secondary | ICD-10-CM | POA: Diagnosis not present

## 2023-05-05 DIAGNOSIS — E039 Hypothyroidism, unspecified: Secondary | ICD-10-CM | POA: Diagnosis not present

## 2023-05-05 DIAGNOSIS — F329 Major depressive disorder, single episode, unspecified: Secondary | ICD-10-CM | POA: Diagnosis not present

## 2023-05-05 DIAGNOSIS — D693 Immune thrombocytopenic purpura: Secondary | ICD-10-CM | POA: Diagnosis not present

## 2023-05-05 DIAGNOSIS — F0283 Dementia in other diseases classified elsewhere, unspecified severity, with mood disturbance: Secondary | ICD-10-CM | POA: Diagnosis not present

## 2023-05-05 DIAGNOSIS — J449 Chronic obstructive pulmonary disease, unspecified: Secondary | ICD-10-CM | POA: Diagnosis not present

## 2023-05-06 ENCOUNTER — Ambulatory Visit: Payer: Medicare Other | Admitting: Physician Assistant

## 2023-05-06 DIAGNOSIS — F329 Major depressive disorder, single episode, unspecified: Secondary | ICD-10-CM | POA: Diagnosis not present

## 2023-05-06 DIAGNOSIS — D693 Immune thrombocytopenic purpura: Secondary | ICD-10-CM | POA: Diagnosis not present

## 2023-05-06 DIAGNOSIS — K8070 Calculus of gallbladder and bile duct without cholecystitis without obstruction: Secondary | ICD-10-CM | POA: Diagnosis not present

## 2023-05-06 DIAGNOSIS — E039 Hypothyroidism, unspecified: Secondary | ICD-10-CM | POA: Diagnosis not present

## 2023-05-06 DIAGNOSIS — F0283 Dementia in other diseases classified elsewhere, unspecified severity, with mood disturbance: Secondary | ICD-10-CM | POA: Diagnosis not present

## 2023-05-06 DIAGNOSIS — J449 Chronic obstructive pulmonary disease, unspecified: Secondary | ICD-10-CM | POA: Diagnosis not present

## 2023-05-07 ENCOUNTER — Ambulatory Visit
Admission: RE | Admit: 2023-05-07 | Discharge: 2023-05-07 | Disposition: A | Discharge status: Home / Self Care | Payer: Medicare Other | Source: Ambulatory Visit | Attending: Registered Nurse | Admitting: Registered Nurse

## 2023-05-07 ENCOUNTER — Telehealth: Payer: Self-pay

## 2023-05-07 DIAGNOSIS — Z1231 Encounter for screening mammogram for malignant neoplasm of breast: Secondary | ICD-10-CM | POA: Diagnosis not present

## 2023-05-08 DIAGNOSIS — E039 Hypothyroidism, unspecified: Secondary | ICD-10-CM | POA: Diagnosis not present

## 2023-05-08 DIAGNOSIS — G629 Polyneuropathy, unspecified: Secondary | ICD-10-CM | POA: Diagnosis not present

## 2023-05-08 DIAGNOSIS — M19019 Primary osteoarthritis, unspecified shoulder: Secondary | ICD-10-CM | POA: Diagnosis not present

## 2023-05-08 DIAGNOSIS — D693 Immune thrombocytopenic purpura: Secondary | ICD-10-CM | POA: Diagnosis not present

## 2023-05-08 DIAGNOSIS — Z853 Personal history of malignant neoplasm of breast: Secondary | ICD-10-CM | POA: Diagnosis not present

## 2023-05-08 DIAGNOSIS — K5900 Constipation, unspecified: Secondary | ICD-10-CM | POA: Diagnosis not present

## 2023-05-08 DIAGNOSIS — M5416 Radiculopathy, lumbar region: Secondary | ICD-10-CM | POA: Diagnosis not present

## 2023-05-08 DIAGNOSIS — E7800 Pure hypercholesterolemia, unspecified: Secondary | ICD-10-CM | POA: Diagnosis not present

## 2023-05-08 DIAGNOSIS — F0283 Dementia in other diseases classified elsewhere, unspecified severity, with mood disturbance: Secondary | ICD-10-CM | POA: Diagnosis not present

## 2023-05-08 DIAGNOSIS — F329 Major depressive disorder, single episode, unspecified: Secondary | ICD-10-CM | POA: Diagnosis not present

## 2023-05-08 DIAGNOSIS — H6123 Impacted cerumen, bilateral: Secondary | ICD-10-CM | POA: Diagnosis not present

## 2023-05-08 DIAGNOSIS — Z96651 Presence of right artificial knee joint: Secondary | ICD-10-CM | POA: Diagnosis not present

## 2023-05-08 DIAGNOSIS — I4510 Unspecified right bundle-branch block: Secondary | ICD-10-CM | POA: Diagnosis not present

## 2023-05-08 DIAGNOSIS — I10 Essential (primary) hypertension: Secondary | ICD-10-CM | POA: Diagnosis not present

## 2023-05-08 DIAGNOSIS — Z8551 Personal history of malignant neoplasm of bladder: Secondary | ICD-10-CM | POA: Diagnosis not present

## 2023-05-08 DIAGNOSIS — H9113 Presbycusis, bilateral: Secondary | ICD-10-CM | POA: Diagnosis not present

## 2023-05-08 DIAGNOSIS — M810 Age-related osteoporosis without current pathological fracture: Secondary | ICD-10-CM | POA: Diagnosis not present

## 2023-05-08 DIAGNOSIS — H9190 Unspecified hearing loss, unspecified ear: Secondary | ICD-10-CM | POA: Diagnosis not present

## 2023-05-08 DIAGNOSIS — K219 Gastro-esophageal reflux disease without esophagitis: Secondary | ICD-10-CM | POA: Diagnosis not present

## 2023-05-08 DIAGNOSIS — K8070 Calculus of gallbladder and bile duct without cholecystitis without obstruction: Secondary | ICD-10-CM | POA: Diagnosis not present

## 2023-05-08 DIAGNOSIS — G8929 Other chronic pain: Secondary | ICD-10-CM | POA: Diagnosis not present

## 2023-05-08 DIAGNOSIS — M2030 Hallux varus (acquired), unspecified foot: Secondary | ICD-10-CM | POA: Diagnosis not present

## 2023-05-08 DIAGNOSIS — D509 Iron deficiency anemia, unspecified: Secondary | ICD-10-CM | POA: Diagnosis not present

## 2023-05-08 DIAGNOSIS — Z860100 Personal history of colon polyps, unspecified: Secondary | ICD-10-CM | POA: Diagnosis not present

## 2023-05-08 DIAGNOSIS — J449 Chronic obstructive pulmonary disease, unspecified: Secondary | ICD-10-CM | POA: Diagnosis not present

## 2023-05-08 NOTE — Patient Outreach (Signed)
 Care Management  Transitions of Care Program Transitions of Care Post-discharge Discharge   05/08/2023 Name: Darlene [name] MRN: [id number] DOB: [birth year]  Subjective: Darlene [name] is a 89 y.o. year old female who is a primary care patient of Prevost, Mary Ellen, FNP. The Care Management team Engaged with patient by telephone to assess and address transitions of care needs.   Consent to Services:  Patient was given information about care management services, agreed to services, and gave verbal consent to participate.   Assessment:   Patient doing well-states she has no further abdominal pain/discomfort and has been trying hard to maintain her diet free from fried, fatty foods.   She has a 24/7 caregivers and her next neighbor has been very instrumental in her care.   Patient has successfully completed 30-day TOC program. Declined transfer to longitudinal RN CM. Case is being closed at this time. Patient has RN CM contact info and aware they can contact RN CM in the future if needs arise.  SDOH Interventions    Flowsheet Row Telephone from 04/08/2023 in [place] [facility] DEPARTMENT Office Visit from 07/19/2017 in [facility] Neurology  SDOH Interventions    Food Insecurity Interventions Intervention Not Indicated --  Housing Interventions Intervention Not Indicated --  Transportation Interventions Intervention Not Indicated  [Family provide all of patient's transportation] --  Utilities Interventions Intervention Not Indicated --  Depression Interventions/Treatment  -- Currently on Treatment        Goals Addressed             This Visit's Progress    COMPLETED: Transition Of Care       Current Barriers:  Chronic Disease Management support and education needs related to Gallstones  Diet/Nutrition for Gallstone Provider appointments    RNCM Clinical Goal(s):  Patient will work with the Care Management team over the next 30 days to address Transition  of Care Barriers: Medication Management Patient caregivers to help patient make good diet choices such as foods low in fat content and rich in fruits and vegetables.  Try to stay away from the cream cheese, sweets, candy, bacon Provider appointments Functional/Safety through collaboration with RN Care manager, provider, and care team. (RESOLVED)  Interventions: Evaluation of current treatment plan related to  self management and patient's adherence to plan as established by provider  Transitions of Care:  Goal Met. Instruct in the safe use of durable medical equipment. Reviewed/discussed importance of keeping upcoming doctor appointments.  Patient verbalized understanding.  Ongoing weekly HH Physical therapy visits  Falls Interventions:  (Status:  Goal Met.) Short Term Goal Provided verbal education re: potential causes of falls, fall prevention strategies I.e. removal of drop cords, throw rugs. Home Health Physical Therapy and Occupational have removed or made recommendations for items to be removed to decrease patient likely of tripping over them. Assessed for falls since last encounter.  Patient instructed to notify caregivers and doctor for recent fall and/or fall injuries. As of 12/12, the patient has not fallen. Reinforce use of walker when  getting up from bed, chair or walking. Verb. Good understanding.  Gallstones Intervention:  Goal on track:  Yes. Reinforced dietary recommendations to help decrease potential for gallstone flare-ups I.e., limit fatty or greasy foods and eat plenty of foods containing fiber and calcium .  Patient verbalized understanding but said her doctor told her she could eat anything at her last appointment.  Assess for stomach pains or pain that may radiate between the shoulder blades or  upper back - 12/16 - Patient denies any abdominal pains, nausea, or vomiting or general malaise. Call your doctor immediately for severe pain in your abdomen (belly), shoulder  or back area, nausea or vomiting or changes in the color of your skin or eyes (yellowing).  Patient Goals/Self-Care Activities: Take all medications as prescribed Attend all scheduled provider appointments Call pharmacy for medication refills 3-7 days in advance of running out of medications Call provider office for new concerns or questions  Maintain a low fat DIET, avoid fried, grease foods  Follow Up Plan:  Case closed to [facility] Program.  Patient is being well cared for with 24/7 sitters and a very supportive neighbor that handles a lot of errands, shopping, etc.\ Please do not hesitate to call me should have future questions or need assistance.  Best Wishes Ms. Alisa, I have enjoyed talking with you and see travel that speedy road to recovery!  Pasco Lunger BSN, RN RN Care Manager   Transitions of Care VBCI - [facility] [place] Direct Dial Number:  [phone]         Plan:  Patient has completed 30 day TOC program successfully - no further outreach at this time.  Cainen Burnham Lunger BSN, Programmer, Systems / Transitions of Care [place] [facility], [facility] Direct Dial Number:  [phone]

## 2023-05-10 ENCOUNTER — Ambulatory Visit: Payer: Medicare Other | Admitting: Physician Assistant

## 2023-05-10 DIAGNOSIS — F0283 Dementia in other diseases classified elsewhere, unspecified severity, with mood disturbance: Secondary | ICD-10-CM | POA: Diagnosis not present

## 2023-05-10 DIAGNOSIS — D693 Immune thrombocytopenic purpura: Secondary | ICD-10-CM | POA: Diagnosis not present

## 2023-05-10 DIAGNOSIS — J449 Chronic obstructive pulmonary disease, unspecified: Secondary | ICD-10-CM | POA: Diagnosis not present

## 2023-05-10 DIAGNOSIS — E039 Hypothyroidism, unspecified: Secondary | ICD-10-CM | POA: Diagnosis not present

## 2023-05-10 DIAGNOSIS — F329 Major depressive disorder, single episode, unspecified: Secondary | ICD-10-CM | POA: Diagnosis not present

## 2023-05-10 DIAGNOSIS — K8070 Calculus of gallbladder and bile duct without cholecystitis without obstruction: Secondary | ICD-10-CM | POA: Diagnosis not present

## 2023-05-12 DIAGNOSIS — F0283 Dementia in other diseases classified elsewhere, unspecified severity, with mood disturbance: Secondary | ICD-10-CM | POA: Diagnosis not present

## 2023-05-12 DIAGNOSIS — J449 Chronic obstructive pulmonary disease, unspecified: Secondary | ICD-10-CM | POA: Diagnosis not present

## 2023-05-12 DIAGNOSIS — D693 Immune thrombocytopenic purpura: Secondary | ICD-10-CM | POA: Diagnosis not present

## 2023-05-12 DIAGNOSIS — E039 Hypothyroidism, unspecified: Secondary | ICD-10-CM | POA: Diagnosis not present

## 2023-05-12 DIAGNOSIS — K8070 Calculus of gallbladder and bile duct without cholecystitis without obstruction: Secondary | ICD-10-CM | POA: Diagnosis not present

## 2023-05-12 DIAGNOSIS — F329 Major depressive disorder, single episode, unspecified: Secondary | ICD-10-CM | POA: Diagnosis not present

## 2023-05-17 ENCOUNTER — Ambulatory Visit: Payer: Medicare Other | Admitting: Physician Assistant

## 2023-05-17 DIAGNOSIS — K8070 Calculus of gallbladder and bile duct without cholecystitis without obstruction: Secondary | ICD-10-CM | POA: Diagnosis not present

## 2023-05-17 DIAGNOSIS — F329 Major depressive disorder, single episode, unspecified: Secondary | ICD-10-CM | POA: Diagnosis not present

## 2023-05-17 DIAGNOSIS — F0283 Dementia in other diseases classified elsewhere, unspecified severity, with mood disturbance: Secondary | ICD-10-CM | POA: Diagnosis not present

## 2023-05-17 DIAGNOSIS — J449 Chronic obstructive pulmonary disease, unspecified: Secondary | ICD-10-CM | POA: Diagnosis not present

## 2023-05-17 DIAGNOSIS — E039 Hypothyroidism, unspecified: Secondary | ICD-10-CM | POA: Diagnosis not present

## 2023-05-17 DIAGNOSIS — D693 Immune thrombocytopenic purpura: Secondary | ICD-10-CM | POA: Diagnosis not present

## 2023-05-23 ENCOUNTER — Emergency Department (HOSPITAL_BASED_OUTPATIENT_CLINIC_OR_DEPARTMENT_OTHER)
Admission: EM | Admit: 2023-05-23 | Discharge: 2023-05-23 | Disposition: A | Discharge status: Home / Self Care | Payer: Medicare Other | Attending: Emergency Medicine | Admitting: Emergency Medicine

## 2023-05-23 ENCOUNTER — Emergency Department (HOSPITAL_BASED_OUTPATIENT_CLINIC_OR_DEPARTMENT_OTHER): Payer: Medicare Other

## 2023-05-23 ENCOUNTER — Encounter (HOSPITAL_BASED_OUTPATIENT_CLINIC_OR_DEPARTMENT_OTHER): Payer: Self-pay | Admitting: Emergency Medicine

## 2023-05-23 DIAGNOSIS — F0390 Unspecified dementia without behavioral disturbance: Secondary | ICD-10-CM | POA: Diagnosis not present

## 2023-05-23 DIAGNOSIS — R918 Other nonspecific abnormal finding of lung field: Secondary | ICD-10-CM | POA: Diagnosis not present

## 2023-05-23 DIAGNOSIS — Z8572 Personal history of non-Hodgkin lymphomas: Secondary | ICD-10-CM | POA: Insufficient documentation

## 2023-05-23 DIAGNOSIS — Z043 Encounter for examination and observation following other accident: Secondary | ICD-10-CM | POA: Diagnosis not present

## 2023-05-23 DIAGNOSIS — Z7982 Long term (current) use of aspirin: Secondary | ICD-10-CM | POA: Diagnosis not present

## 2023-05-23 DIAGNOSIS — S22070A Wedge compression fracture of T9-T10 vertebra, initial encounter for closed fracture: Secondary | ICD-10-CM

## 2023-05-23 DIAGNOSIS — W19XXXA Unspecified fall, initial encounter: Secondary | ICD-10-CM | POA: Diagnosis not present

## 2023-05-23 DIAGNOSIS — S199XXA Unspecified injury of neck, initial encounter: Secondary | ICD-10-CM | POA: Diagnosis not present

## 2023-05-23 DIAGNOSIS — M4802 Spinal stenosis, cervical region: Secondary | ICD-10-CM | POA: Diagnosis not present

## 2023-05-23 DIAGNOSIS — J449 Chronic obstructive pulmonary disease, unspecified: Secondary | ICD-10-CM | POA: Diagnosis not present

## 2023-05-23 DIAGNOSIS — Z853 Personal history of malignant neoplasm of breast: Secondary | ICD-10-CM | POA: Diagnosis not present

## 2023-05-23 DIAGNOSIS — S2222XA Fracture of body of sternum, initial encounter for closed fracture: Secondary | ICD-10-CM | POA: Diagnosis not present

## 2023-05-23 DIAGNOSIS — S2222XK Fracture of body of sternum, subsequent encounter for fracture with nonunion: Secondary | ICD-10-CM | POA: Diagnosis not present

## 2023-05-23 DIAGNOSIS — M5030 Other cervical disc degeneration, unspecified cervical region: Secondary | ICD-10-CM | POA: Diagnosis not present

## 2023-05-23 DIAGNOSIS — W010XXA Fall on same level from slipping, tripping and stumbling without subsequent striking against object, initial encounter: Secondary | ICD-10-CM | POA: Insufficient documentation

## 2023-05-23 DIAGNOSIS — M8580 Other specified disorders of bone density and structure, unspecified site: Secondary | ICD-10-CM | POA: Diagnosis not present

## 2023-05-23 DIAGNOSIS — R079 Chest pain, unspecified: Secondary | ICD-10-CM | POA: Diagnosis not present

## 2023-05-23 DIAGNOSIS — S0990XA Unspecified injury of head, initial encounter: Secondary | ICD-10-CM | POA: Diagnosis not present

## 2023-05-23 DIAGNOSIS — R0789 Other chest pain: Secondary | ICD-10-CM | POA: Diagnosis not present

## 2023-05-23 DIAGNOSIS — M51369 Other intervertebral disc degeneration, lumbar region without mention of lumbar back pain or lower extremity pain: Secondary | ICD-10-CM | POA: Diagnosis not present

## 2023-05-23 DIAGNOSIS — S29002A Unspecified injury of muscle and tendon of back wall of thorax, initial encounter: Secondary | ICD-10-CM | POA: Diagnosis present

## 2023-05-23 MED ORDER — LIDOCAINE 5 % EX PTCH
1.0000 | MEDICATED_PATCH | CUTANEOUS | Status: DC
  Administered 2023-05-23: 1 via TRANSDERMAL
  Filled 2023-05-23: qty 1

## 2023-05-23 MED ORDER — OXYCODONE HCL 5 MG PO TABS: 2.5000 mg | ORAL_TABLET | ORAL | 0 refills | Status: AC | PRN

## 2023-05-23 MED ORDER — ACETAMINOPHEN 500 MG PO TABS
1000.0000 mg | ORAL_TABLET | Freq: Once | ORAL | Status: AC
  Administered 2023-05-23: 1000 mg via ORAL
  Filled 2023-05-23: qty 2

## 2023-05-23 NOTE — ED Provider Notes (Signed)
[facility] AT [facility] Provider Note   CSN: [id number] Arrival date & time: 05/23/23  1823     History {Add pertinent medical, surgical, social history, OB history to HPI:1} Chief Complaint  Patient presents with   Darlene [name]    Darlene [name] is a 89 y.o. female.  89 year old female with history of mild dementia, breast cancer, non-Hodgkin's lymphoma, COPD, and thrombocytopenia who presents emergency department after a fall.  Patient tells me that she tried to get up without any assistance and fell down.  Called the son for additional history.  Says that the caregiver that she had today went to take the dog outside and when the caregiver returned his mother was on the floor.  She said that she went to the kitchen to grab something.  Tells me that she hit her head and also her chest.  No chest pain prior to the fall.  No LOC.  Is having pain on the right side of her ribs and to her forehead.  She is not on blood thinners.  No hip pain or pain elsewhere.       Home Medications Prior to Admission medications   Medication Sig Start Date End Date Taking? Authorizing Provider  acetaminophen (TYLENOL) 500 MG tablet Take 1 tablet (500 mg total) by mouth every 6 (six) hours as needed for mild pain. 04/19/16   Tilden Fossa, MD  aspirin 81 MG tablet Take 81 mg by mouth daily.    [provider]  betamethasone dipropionate 0.05 % cream     [provider]  cholecalciferol (VITAMIN D) 1000 UNITS tablet Take 1,000 Units by mouth daily.    [provider]  citalopram (CELEXA) 40 MG tablet Take 40 mg by mouth daily.     [provider]  Clobetasol Propionate 0.05 % shampoo     [provider]  Coenzyme Q10 (CO Q 10) 100 MG CAPS See admin instructions.    [provider]  Cyanocobalamin (VITAMIN B-12 PO) Take 1,000 mcg by mouth daily.    [provider]  diclofenac Sodium (VOLTAREN) 1 % GEL     [provider]  ergocalciferol (VITAMIN D2) 1.25 MG (50000 UT) capsule  06/18/18   [provider]  esomeprazole (NEXIUM) 40 MG capsule Take 40 mg by mouth 2 (two) times daily before a meal.    [provider]  famciclovir (FAMVIR) 125 MG tablet  08/15/18   [provider]  Iron, Ferrous Sulfate, 325 (65 Fe) MG TABS Take 1 tablet by mouth every other day. 04/06/23   Leeroy Bock, MD  levothyroxine (SYNTHROID, LEVOTHROID) 75 MCG tablet Take 75 mcg by mouth daily before breakfast.     [provider]  lidocaine (LIDODERM) 5 % Place 1 patch onto the skin daily. Remove & Discard patch within 12 hours or as directed by MD 03/17/23   Gloris Manchester, MD  lovastatin (MEVACOR) 20 MG tablet TAKE 1 TABLET BY MOUTH ONCEDAILY AT BEDTIME    [provider]  magnesium oxide (MAG-OX) 400 MG tablet Take 400 mg by mouth daily.    [provider]  methocarbamol (ROBAXIN) 500 MG tablet Take 1 tablet (500 mg total) by mouth every 8 (eight) hours as needed for muscle spasms. Patient not taking: Reported on 04/19/2023 03/17/23   Gloris Manchester, MD  metoprolol succinate (TOPROL-XL) 25 MG 24 hr tablet Take 25 mg by mouth daily.     [provider]  mirabegron  ER (MYRBETRIQ) 25 MG TB24 tablet Take 25 mg by mouth daily.    [provider]  Multiple Vitamins-Minerals (MULTIVITAMINS THER. W/MINERALS) TABS Take 1 tablet by mouth daily.    [provider]  nystatin-triamcinolone ointment (MYCOLOG) Apply 1 Application topically 2 (two) times daily. Patient not taking: Reported on 04/19/2023    [provider]  ondansetron (ZOFRAN-ODT) 4 MG disintegrating tablet Take 1 tablet (4 mg total) by mouth every 8 (eight) hours as needed for nausea or vomiting. Patient not taking: Reported on 04/19/2023 03/29/23   Kommor, Wyn Forster, MD  Polyethyl Glycol-Propyl Glycol 0.4-0.3 % SOLN Apply 2 drops to eye 2 (two) times daily as needed (for dry  eyes). Patient not taking: Reported on 04/19/2023    [provider]  pregabalin (LYRICA) 50 MG capsule Take 50 mg by mouth 2 (two) times daily.    [provider]  Probiotic Product (PROBIOTIC PO) Take 1 tablet by mouth daily. Patient not taking: Reported on 04/19/2023    [provider]  traMADol (ULTRAM) 50 MG tablet     [provider]      Allergies    Oxycodone, Oxycodone hcl, Sulfamethoxazole-trimethoprim, Codeine, Doxycycline, Meperidine and related, Sulfa antibiotics, and Sulfacetamide sodium    Review of Systems   Review of Systems  Physical Exam Updated Vital Signs BP (!) 110/58 (BP Location: Right Arm)   Pulse 71   Temp (!) 97.5 F (36.4 C) (Oral)   Resp 20   SpO2 96%  Physical Exam Vitals and nursing note reviewed.  Constitutional:      General: She is not in acute distress.    Appearance: Normal appearance. She is well-developed. She is not ill-appearing.  HENT:     Head: Normocephalic and atraumatic.     Right Ear: External ear normal.     Left Ear: External ear normal.     Nose: Nose normal.     Mouth/Throat:     Mouth: Mucous membranes are moist.     Pharynx: Oropharynx is clear.  Eyes:     Extraocular Movements: Extraocular movements intact.     Conjunctiva/sclera: Conjunctivae normal.     Pupils: Pupils are equal, round, and reactive to light.  Neck:     Comments: No C-spine midline tenderness to palpation Cardiovascular:     Rate and Rhythm: Normal rate and regular rhythm.     Pulses: Normal pulses.     Heart sounds: Normal heart sounds. No murmur heard.    Comments: Tenderness to palpation of right ribs at the anterior axillary line at approximately the 7th rib Pulmonary:     Effort: Pulmonary effort is normal. No respiratory distress.     Breath sounds: Normal breath sounds.  Abdominal:     General: Abdomen is flat. There is no distension.     Palpations: Abdomen is soft. There is no mass.     Tenderness:  There is no abdominal tenderness. There is no guarding.  Musculoskeletal:        General: No deformity. Normal range of motion.     Cervical back: Normal range of motion and neck supple. No rigidity or tenderness.     Right lower leg: No edema.     Left lower leg: No edema.     Comments: No tenderness to palpation of midline thoracic or lumbar spine.  No step-offs palpated.  No tenderness to palpation of bilateral clavicles.  No tenderness to palpation, bruising, or deformities noted of bilateral shoulders, elbows, wrists,  hips, knees, or ankles.  Skin:    General: Skin is warm and dry.  Neurological:     General: No focal deficit present.     Mental Status: She is alert and oriented to person, place, and time. Mental status is at baseline.     Cranial Nerves: No cranial nerve deficit.     Sensory: No sensory deficit.     Motor: No weakness.  Psychiatric:        Mood and Affect: Mood normal.     ED Results / Procedures / Treatments   Labs (all labs ordered are listed, but only abnormal results are displayed) Labs Reviewed - No data to display  EKG None  Radiology No results found.  Procedures Procedures  {Document cardiac monitor, telemetry assessment procedure when appropriate:1}  Medications Ordered in ED Medications - No data to display  ED Course/ Medical Decision Making/ A&P   {   Click here for ABCD2, HEART and other calculatorsREFRESH Note before signing :1}                              Medical Decision Making Amount and/or Complexity of Data Reviewed Radiology: ordered.  Risk OTC drugs. Prescription drug management.   ***  {Document critical care time when appropriate:1} {Document review of labs and clinical decision tools ie heart score, Chads2Vasc2 etc:1}  {Document your independent review of radiology images, and any outside records:1} {Document your discussion with family members, caretakers, and with consultants:1} {Document social  determinants of health affecting pt's care:1} {Document your decision making why or why not admission, treatments were needed:1} Final Clinical Impression(s) / ED Diagnoses Final diagnoses:  None    Rx / DC Orders ED Discharge Orders     None

## 2023-05-23 NOTE — ED Triage Notes (Signed)
Fall Slipped, unwitnessed Caregiver not in room  Pain in right chest, painful to touch Denies hitting head, no loc, no bloodthinner Fall last night as well Ems from home 124/70  88 16 98% RA

## 2023-05-23 NOTE — ED Notes (Signed)
AVS provided by edp was reviewed with pt and pts daughter Angie Fava. Caregiver denies any additional needs. Pharmacy verified. Pt wheelchair to vehicle. Assisted into car by this RN.

## 2023-05-23 NOTE — ED Notes (Signed)
Lydal, daughter, can be contacted at [phone]

## 2023-05-23 NOTE — ED Notes (Signed)
EDP at bedside discussing plan with pt and daughter

## 2023-05-23 NOTE — ED Notes (Signed)
Pt ambulated in room with walker. At home pt is able to ambulate with walker and requires some assistance changing positions. Pt was able to rotate self independently to the edge of the bed. Was able to stand independently using walker. Pt had some gait instability when walking. Pts daughter sts this is normal. When returning to bed and pt attempting to lay back into bed pt reports chest discomfort.

## 2023-05-23 NOTE — Discharge Instructions (Signed)
You were seen for your fall in the emergency department.  You are found to have a broken sternum and a compression fracture of your thoracic spine.  Both of these will heal without surgery.  At home, please take Tylenol and use lidocaine patches for your pain.  Take oxycodone for any breakthrough pain that you have.  Check your MyChart online for the results of any tests that had not resulted by the time you left the emergency department.   Follow-up with your primary doctor in 2-3 days regarding your visit.  Follow-up with the trauma and spine clinic in 1 to 2 weeks.  Return immediately to the emergency department if you experience any of the following: Worsening pain, weakness or numbness of your legs, or any other concerning symptoms.    Thank you for visiting our Emergency Department. It was a pleasure taking care of you today.

## 2023-05-24 ENCOUNTER — Encounter: Payer: Self-pay | Admitting: Podiatry

## 2023-05-24 ENCOUNTER — Ambulatory Visit (INDEPENDENT_AMBULATORY_CARE_PROVIDER_SITE_OTHER): Payer: Medicare Other | Admitting: Podiatry

## 2023-05-24 VITALS — Ht 62.0 in

## 2023-05-24 DIAGNOSIS — M2031 Hallux varus (acquired), right foot: Secondary | ICD-10-CM | POA: Diagnosis not present

## 2023-05-24 DIAGNOSIS — B957 Other staphylococcus as the cause of diseases classified elsewhere: Secondary | ICD-10-CM | POA: Diagnosis not present

## 2023-05-24 DIAGNOSIS — L080 Pyoderma: Secondary | ICD-10-CM | POA: Diagnosis not present

## 2023-05-24 DIAGNOSIS — L02611 Cutaneous abscess of right foot: Secondary | ICD-10-CM

## 2023-05-24 MED ORDER — CEPHALEXIN 500 MG PO CAPS: 500.0000 mg | ORAL_CAPSULE | Freq: Two times a day (BID) | ORAL | 0 refills | Status: AC

## 2023-05-24 NOTE — Progress Notes (Signed)
Chief Complaint  Patient presents with   Toe Pain    She has noticed a place that looks like a callus on the top of big toe,   HPI: 89 y.o. female presents today with a family member with concern of possible infected right great toe.  She acknowledges that the toes on the right foot are contracted and turning inward.  Shoe foot is difficult.  As she noted she avoided surgery when she was younger.  She also wore high-heeled shoes at a younger age for many years.  As she notes the right great toe has been getting rubbed by her shoes and is sore today.  She denies any visible drainage.  Denies blunt trauma to the area.  Past Medical History:  Diagnosis Date   Anemia    Anxiety    Aortic atherosclerosis (HCC)    Breast cancer, stage 1, estrogen receptor negative (HCC) 07/06/2011   Cancer of breast (HCC)    Cataract    Chronic ITP (idiopathic thrombocytopenia) (HCC) 07/06/2011   Constipation    takes Colace daily   COPD (chronic obstructive pulmonary disease) (HCC)    mild   Dementia (HCC)    Depression    "nervous break down age 16"   Fast heart beat    GERD (gastroesophageal reflux disease)    takes Nexium daily   History of colon polyps    History of depression    received shock treatments    History of palpitations    takes Metoprolol daily   History of vertigo    HOH (hard of hearing)    has hearing aids   Hyperlipidemia    takes Lovastatin daily   Hypertension    Hypothyroid 07/06/2011   takes SYnthroid daily   Joint pain    Joint swelling    Lymphoma (HCC)    Lymphoma (HCC)    B cell    Lymphoma of lymph nodes of head, face, and/or neck (HCC) 07/06/2011   Neuropathy    Nocturia    Osteopenia    Osteoporosis    hx of and take Reclast   Peripheral neuropathy    Personal history of chemotherapy    Personal history of radiation therapy    Right knee DJD    Shortness of breath dyspnea    with exertion   Thyroid disease    Umbilical hernia    Vitamin D  deficiency     Past Surgical History:  Procedure Laterality Date   ADENOIDECTOMY     as a child   BREAST LUMPECTOMY Right 1994   BREAST SURGERY Right    BUNIONECTOMY Right    CARDIAC CATHETERIZATION  2014   COLONOSCOPY     FOOT SURGERY Right    FRACTURE SURGERY     wrist right , right knee   INSERTION OF MESH N/A 04/17/2015   Procedure: INSERTION OF MESH;  Surgeon: Axel Filler, MD;  Location: [facility] OR;  Service: General;  Laterality: N/A;   KNEE SURGERY     LEFT HEART CATHETERIZATION WITH CORONARY ANGIOGRAM N/A 12/05/2012   Procedure: LEFT HEART CATHETERIZATION WITH CORONARY ANGIOGRAM;  Surgeon: Lennette Bihari, MD;  Location: [facility] CATH LAB;  Service: Cardiovascular;  Laterality: N/A;   right  breast surgery     d/t breast cancer   TONSILLECTOMY     as a child   TOTAL KNEE ARTHROPLASTY Right 06/19/2013   Procedure: TOTAL KNEE ARTHROPLASTY;  Surgeon: Nilda Simmer, MD;  Location: [facility] OR;  Service: Orthopedics;  Laterality: Right;   TRANSURETHRAL RESECTION OF BLADDER TUMOR WITH GYRUS (TURBT-GYRUS)     01-26-18 Dr. Marlou Porch   TRANSURETHRAL RESECTION OF BLADDER TUMOR WITH MITOMYCIN-C Bilateral 01/26/2018   Procedure: TRANSURETHRAL RESECTION OF BLADDER TUMOR WITH GEMCITABIN BLLATERAL RETROGRADE PYELOGRAM;  Surgeon: Crist Fat, MD;  Location: [facility] ORS;  Service: Urology;  Laterality: Bilateral;   TURBINATE REDUCTION     UMBILICAL HERNIA REPAIR N/A 04/17/2015   Procedure: LAPAROSCOPIC UMBILICAL HERNIA REPAIR WITH MESH;  Surgeon: Axel Filler, MD;  Location: [facility] OR;  Service: General;  Laterality: N/A;   UPPER GASTROINTESTINAL ENDOSCOPY      Allergies  Allergen Reactions   Oxycodone Other (See Comments)    Made her depressed   Oxycodone Hcl     Other reaction(s): depressed feeling   Sulfamethoxazole-Trimethoprim     Other reaction(s): Unknown   Codeine Itching            Doxycycline Swelling and Other (See Comments)    Burns throat also   Meperidine And Related Itching and  Rash    Makes cheeks red also   Sulfa Antibiotics Itching   Sulfacetamide Sodium Itching   Physical Exam: There are trace palpable pedal pulses to the right foot.  There is a semirigid contracture of the right hallux at the IP joint.  The toe is also medially angulated/hallux varus.  There is a hyperkeratotic lesion with localized erythema and calor and dolor noted on the dorsal aspect of the right hallux IP joint.  Upon debridement of the lesion with a sterile #313 blade, Golden purulence was expressed.  This was swabbed for culture.  There is no exposed bone or tendon.  Assessment/Plan of Care: 1. Abscess of right foot   2. Hallux malleus of right foot      Meds ordered this encounter  Medications   cephALEXin (KEFLEX) 500 MG capsule    Sig: Take 1 capsule (500 mg total) by mouth 2 (two) times daily for 10 days.    Dispense:  20 capsule    Refill:  0   Discussed clinical findings with patient today.  With the patient's consent the abscess on the dorsal aspect of the right hallux IPJ was punctured with the above-noted sterile #313 blade.  The Golden purulence that was expressed was swabbed and sent to  [facility] labs for culture.  The area was thoroughly cleansed and then iodine solution with a light gauze pad and Band-Aid were then applied.  Her current shoe was stretched with a ball and ring device in the area to prevent further irritation.  She was encouraged to avoid wearing closed toe shoes at home over the next week to allow for decrease pressure and the opportunity for the wound to heal.  Prescription for Keflex 500 mg 1 tab p.o. twice daily was sent to her pharmacy.  She will apply iodine to the area at home followed by a small piece of gauze and Band-Aid.  She can also perform Epsom salt soaks but if she has difficulty feeling at baseline on her own, then she can skip the soaks.  It should be noted that the patient continued to express some difficulty in either hearing or  comprehending the post-visit instructions.  Her family member was with her and expressed understanding.  Also had the medical assistant enter the room to review any questions with the patient and provide answers.  Follow-up in 1 week for recheck   Jethro Radke D. Marquesha Robideau,  DPM, FACFAS [facility]     [address] [place], [place] [address]                Office [phone]  Fax [phone]

## 2023-05-25 DIAGNOSIS — S2222XD Fracture of body of sternum, subsequent encounter for fracture with routine healing: Secondary | ICD-10-CM | POA: Diagnosis not present

## 2023-05-25 DIAGNOSIS — S22070D Wedge compression fracture of T9-T10 vertebra, subsequent encounter for fracture with routine healing: Secondary | ICD-10-CM | POA: Diagnosis not present

## 2023-05-25 DIAGNOSIS — Z9181 History of falling: Secondary | ICD-10-CM | POA: Diagnosis not present

## 2023-05-26 DIAGNOSIS — D693 Immune thrombocytopenic purpura: Secondary | ICD-10-CM | POA: Diagnosis not present

## 2023-05-26 DIAGNOSIS — F0283 Dementia in other diseases classified elsewhere, unspecified severity, with mood disturbance: Secondary | ICD-10-CM | POA: Diagnosis not present

## 2023-05-26 DIAGNOSIS — K8070 Calculus of gallbladder and bile duct without cholecystitis without obstruction: Secondary | ICD-10-CM | POA: Diagnosis not present

## 2023-05-26 DIAGNOSIS — E039 Hypothyroidism, unspecified: Secondary | ICD-10-CM | POA: Diagnosis not present

## 2023-05-26 DIAGNOSIS — J449 Chronic obstructive pulmonary disease, unspecified: Secondary | ICD-10-CM | POA: Diagnosis not present

## 2023-05-26 DIAGNOSIS — F329 Major depressive disorder, single episode, unspecified: Secondary | ICD-10-CM | POA: Diagnosis not present

## 2023-05-31 ENCOUNTER — Encounter: Payer: Medicare Other | Admitting: Podiatry

## 2023-05-31 ENCOUNTER — Ambulatory Visit: Payer: Medicare Other | Admitting: Physician Assistant

## 2023-05-31 NOTE — Progress Notes (Signed)
Patient did not show for her f/u appointment this afternoon

## 2023-06-01 ENCOUNTER — Other Ambulatory Visit: Payer: Self-pay | Admitting: Podiatry

## 2023-06-02 ENCOUNTER — Telehealth: Payer: Self-pay

## 2023-06-02 DIAGNOSIS — F0283 Dementia in other diseases classified elsewhere, unspecified severity, with mood disturbance: Secondary | ICD-10-CM | POA: Diagnosis not present

## 2023-06-02 DIAGNOSIS — F329 Major depressive disorder, single episode, unspecified: Secondary | ICD-10-CM | POA: Diagnosis not present

## 2023-06-02 DIAGNOSIS — J449 Chronic obstructive pulmonary disease, unspecified: Secondary | ICD-10-CM | POA: Diagnosis not present

## 2023-06-02 DIAGNOSIS — D693 Immune thrombocytopenic purpura: Secondary | ICD-10-CM | POA: Diagnosis not present

## 2023-06-02 DIAGNOSIS — E039 Hypothyroidism, unspecified: Secondary | ICD-10-CM | POA: Diagnosis not present

## 2023-06-02 DIAGNOSIS — K8070 Calculus of gallbladder and bile duct without cholecystitis without obstruction: Secondary | ICD-10-CM | POA: Diagnosis not present

## 2023-06-02 NOTE — Telephone Encounter (Signed)
-----   Message from Clerance Lav sent at 06/02/2023 12:58 PM EST ----- Please call patient and let her know the pus from her toe grew out a few different organisms.  The medicine that she is on will cover them (cephalexin).  How is it looking/feeling?

## 2023-06-04 ENCOUNTER — Ambulatory Visit: Payer: Medicare Other | Admitting: Physician Assistant

## 2023-06-07 DIAGNOSIS — Z96651 Presence of right artificial knee joint: Secondary | ICD-10-CM | POA: Diagnosis not present

## 2023-06-07 DIAGNOSIS — I10 Essential (primary) hypertension: Secondary | ICD-10-CM | POA: Diagnosis not present

## 2023-06-07 DIAGNOSIS — D509 Iron deficiency anemia, unspecified: Secondary | ICD-10-CM | POA: Diagnosis not present

## 2023-06-07 DIAGNOSIS — Z8551 Personal history of malignant neoplasm of bladder: Secondary | ICD-10-CM | POA: Diagnosis not present

## 2023-06-07 DIAGNOSIS — H9113 Presbycusis, bilateral: Secondary | ICD-10-CM | POA: Diagnosis not present

## 2023-06-07 DIAGNOSIS — G629 Polyneuropathy, unspecified: Secondary | ICD-10-CM | POA: Diagnosis not present

## 2023-06-07 DIAGNOSIS — E039 Hypothyroidism, unspecified: Secondary | ICD-10-CM | POA: Diagnosis not present

## 2023-06-07 DIAGNOSIS — Z860100 Personal history of colon polyps, unspecified: Secondary | ICD-10-CM | POA: Diagnosis not present

## 2023-06-07 DIAGNOSIS — H9190 Unspecified hearing loss, unspecified ear: Secondary | ICD-10-CM | POA: Diagnosis not present

## 2023-06-07 DIAGNOSIS — Z853 Personal history of malignant neoplasm of breast: Secondary | ICD-10-CM | POA: Diagnosis not present

## 2023-06-07 DIAGNOSIS — M810 Age-related osteoporosis without current pathological fracture: Secondary | ICD-10-CM | POA: Diagnosis not present

## 2023-06-07 DIAGNOSIS — M5416 Radiculopathy, lumbar region: Secondary | ICD-10-CM | POA: Diagnosis not present

## 2023-06-07 DIAGNOSIS — M19019 Primary osteoarthritis, unspecified shoulder: Secondary | ICD-10-CM | POA: Diagnosis not present

## 2023-06-07 DIAGNOSIS — E7800 Pure hypercholesterolemia, unspecified: Secondary | ICD-10-CM | POA: Diagnosis not present

## 2023-06-07 DIAGNOSIS — M2030 Hallux varus (acquired), unspecified foot: Secondary | ICD-10-CM | POA: Diagnosis not present

## 2023-06-07 DIAGNOSIS — K8070 Calculus of gallbladder and bile duct without cholecystitis without obstruction: Secondary | ICD-10-CM | POA: Diagnosis not present

## 2023-06-07 DIAGNOSIS — K5900 Constipation, unspecified: Secondary | ICD-10-CM | POA: Diagnosis not present

## 2023-06-07 DIAGNOSIS — H6123 Impacted cerumen, bilateral: Secondary | ICD-10-CM | POA: Diagnosis not present

## 2023-06-07 DIAGNOSIS — F329 Major depressive disorder, single episode, unspecified: Secondary | ICD-10-CM | POA: Diagnosis not present

## 2023-06-07 DIAGNOSIS — J449 Chronic obstructive pulmonary disease, unspecified: Secondary | ICD-10-CM | POA: Diagnosis not present

## 2023-06-07 DIAGNOSIS — K219 Gastro-esophageal reflux disease without esophagitis: Secondary | ICD-10-CM | POA: Diagnosis not present

## 2023-06-07 DIAGNOSIS — D693 Immune thrombocytopenic purpura: Secondary | ICD-10-CM | POA: Diagnosis not present

## 2023-06-07 DIAGNOSIS — I4510 Unspecified right bundle-branch block: Secondary | ICD-10-CM | POA: Diagnosis not present

## 2023-06-07 DIAGNOSIS — G8929 Other chronic pain: Secondary | ICD-10-CM | POA: Diagnosis not present

## 2023-06-07 DIAGNOSIS — F0283 Dementia in other diseases classified elsewhere, unspecified severity, with mood disturbance: Secondary | ICD-10-CM | POA: Diagnosis not present

## 2023-06-08 DIAGNOSIS — F329 Major depressive disorder, single episode, unspecified: Secondary | ICD-10-CM | POA: Diagnosis not present

## 2023-06-08 DIAGNOSIS — F0283 Dementia in other diseases classified elsewhere, unspecified severity, with mood disturbance: Secondary | ICD-10-CM | POA: Diagnosis not present

## 2023-06-08 DIAGNOSIS — D693 Immune thrombocytopenic purpura: Secondary | ICD-10-CM | POA: Diagnosis not present

## 2023-06-08 DIAGNOSIS — K8070 Calculus of gallbladder and bile duct without cholecystitis without obstruction: Secondary | ICD-10-CM | POA: Diagnosis not present

## 2023-06-08 DIAGNOSIS — E039 Hypothyroidism, unspecified: Secondary | ICD-10-CM | POA: Diagnosis not present

## 2023-06-08 DIAGNOSIS — J449 Chronic obstructive pulmonary disease, unspecified: Secondary | ICD-10-CM | POA: Diagnosis not present

## 2023-06-10 DIAGNOSIS — Z741 Need for assistance with personal care: Secondary | ICD-10-CM | POA: Diagnosis not present

## 2023-06-10 DIAGNOSIS — R4189 Other symptoms and signs involving cognitive functions and awareness: Secondary | ICD-10-CM | POA: Diagnosis not present

## 2023-06-10 DIAGNOSIS — R296 Repeated falls: Secondary | ICD-10-CM | POA: Diagnosis not present

## 2023-06-10 DIAGNOSIS — R2681 Unsteadiness on feet: Secondary | ICD-10-CM | POA: Diagnosis not present

## 2023-06-10 DIAGNOSIS — S22070G Wedge compression fracture of T9-T10 vertebra, subsequent encounter for fracture with delayed healing: Secondary | ICD-10-CM | POA: Diagnosis not present

## 2023-06-17 DIAGNOSIS — K8070 Calculus of gallbladder and bile duct without cholecystitis without obstruction: Secondary | ICD-10-CM | POA: Diagnosis not present

## 2023-06-17 DIAGNOSIS — D693 Immune thrombocytopenic purpura: Secondary | ICD-10-CM | POA: Diagnosis not present

## 2023-06-17 DIAGNOSIS — F329 Major depressive disorder, single episode, unspecified: Secondary | ICD-10-CM | POA: Diagnosis not present

## 2023-06-17 DIAGNOSIS — F0283 Dementia in other diseases classified elsewhere, unspecified severity, with mood disturbance: Secondary | ICD-10-CM | POA: Diagnosis not present

## 2023-06-17 DIAGNOSIS — E039 Hypothyroidism, unspecified: Secondary | ICD-10-CM | POA: Diagnosis not present

## 2023-06-17 DIAGNOSIS — J449 Chronic obstructive pulmonary disease, unspecified: Secondary | ICD-10-CM | POA: Diagnosis not present

## 2023-07-01 DIAGNOSIS — F329 Major depressive disorder, single episode, unspecified: Secondary | ICD-10-CM | POA: Diagnosis not present

## 2023-07-01 DIAGNOSIS — D693 Immune thrombocytopenic purpura: Secondary | ICD-10-CM | POA: Diagnosis not present

## 2023-07-01 DIAGNOSIS — E039 Hypothyroidism, unspecified: Secondary | ICD-10-CM | POA: Diagnosis not present

## 2023-07-01 DIAGNOSIS — J449 Chronic obstructive pulmonary disease, unspecified: Secondary | ICD-10-CM | POA: Diagnosis not present

## 2023-07-01 DIAGNOSIS — F0283 Dementia in other diseases classified elsewhere, unspecified severity, with mood disturbance: Secondary | ICD-10-CM | POA: Diagnosis not present

## 2023-07-01 DIAGNOSIS — K8070 Calculus of gallbladder and bile duct without cholecystitis without obstruction: Secondary | ICD-10-CM | POA: Diagnosis not present

## 2023-07-06 DIAGNOSIS — F329 Major depressive disorder, single episode, unspecified: Secondary | ICD-10-CM | POA: Diagnosis not present

## 2023-07-06 DIAGNOSIS — K8070 Calculus of gallbladder and bile duct without cholecystitis without obstruction: Secondary | ICD-10-CM | POA: Diagnosis not present

## 2023-07-06 DIAGNOSIS — E039 Hypothyroidism, unspecified: Secondary | ICD-10-CM | POA: Diagnosis not present

## 2023-07-06 DIAGNOSIS — J449 Chronic obstructive pulmonary disease, unspecified: Secondary | ICD-10-CM | POA: Diagnosis not present

## 2023-07-06 DIAGNOSIS — F0283 Dementia in other diseases classified elsewhere, unspecified severity, with mood disturbance: Secondary | ICD-10-CM | POA: Diagnosis not present

## 2023-07-06 DIAGNOSIS — D693 Immune thrombocytopenic purpura: Secondary | ICD-10-CM | POA: Diagnosis not present

## 2023-07-07 DIAGNOSIS — K219 Gastro-esophageal reflux disease without esophagitis: Secondary | ICD-10-CM | POA: Diagnosis not present

## 2023-07-07 DIAGNOSIS — K8070 Calculus of gallbladder and bile duct without cholecystitis without obstruction: Secondary | ICD-10-CM | POA: Diagnosis not present

## 2023-07-07 DIAGNOSIS — I10 Essential (primary) hypertension: Secondary | ICD-10-CM | POA: Diagnosis not present

## 2023-07-07 DIAGNOSIS — E039 Hypothyroidism, unspecified: Secondary | ICD-10-CM | POA: Diagnosis not present

## 2023-07-07 DIAGNOSIS — H9113 Presbycusis, bilateral: Secondary | ICD-10-CM | POA: Diagnosis not present

## 2023-07-07 DIAGNOSIS — E7800 Pure hypercholesterolemia, unspecified: Secondary | ICD-10-CM | POA: Diagnosis not present

## 2023-07-07 DIAGNOSIS — F329 Major depressive disorder, single episode, unspecified: Secondary | ICD-10-CM | POA: Diagnosis not present

## 2023-07-07 DIAGNOSIS — Z96651 Presence of right artificial knee joint: Secondary | ICD-10-CM | POA: Diagnosis not present

## 2023-07-07 DIAGNOSIS — G629 Polyneuropathy, unspecified: Secondary | ICD-10-CM | POA: Diagnosis not present

## 2023-07-07 DIAGNOSIS — Z8551 Personal history of malignant neoplasm of bladder: Secondary | ICD-10-CM | POA: Diagnosis not present

## 2023-07-07 DIAGNOSIS — H9190 Unspecified hearing loss, unspecified ear: Secondary | ICD-10-CM | POA: Diagnosis not present

## 2023-07-07 DIAGNOSIS — I4510 Unspecified right bundle-branch block: Secondary | ICD-10-CM | POA: Diagnosis not present

## 2023-07-07 DIAGNOSIS — M19019 Primary osteoarthritis, unspecified shoulder: Secondary | ICD-10-CM | POA: Diagnosis not present

## 2023-07-07 DIAGNOSIS — D509 Iron deficiency anemia, unspecified: Secondary | ICD-10-CM | POA: Diagnosis not present

## 2023-07-07 DIAGNOSIS — H6123 Impacted cerumen, bilateral: Secondary | ICD-10-CM | POA: Diagnosis not present

## 2023-07-07 DIAGNOSIS — Z860100 Personal history of colon polyps, unspecified: Secondary | ICD-10-CM | POA: Diagnosis not present

## 2023-07-07 DIAGNOSIS — M810 Age-related osteoporosis without current pathological fracture: Secondary | ICD-10-CM | POA: Diagnosis not present

## 2023-07-07 DIAGNOSIS — M2030 Hallux varus (acquired), unspecified foot: Secondary | ICD-10-CM | POA: Diagnosis not present

## 2023-07-07 DIAGNOSIS — M5416 Radiculopathy, lumbar region: Secondary | ICD-10-CM | POA: Diagnosis not present

## 2023-07-07 DIAGNOSIS — D693 Immune thrombocytopenic purpura: Secondary | ICD-10-CM | POA: Diagnosis not present

## 2023-07-07 DIAGNOSIS — Z853 Personal history of malignant neoplasm of breast: Secondary | ICD-10-CM | POA: Diagnosis not present

## 2023-07-07 DIAGNOSIS — G8929 Other chronic pain: Secondary | ICD-10-CM | POA: Diagnosis not present

## 2023-07-07 DIAGNOSIS — K5900 Constipation, unspecified: Secondary | ICD-10-CM | POA: Diagnosis not present

## 2023-07-07 DIAGNOSIS — J449 Chronic obstructive pulmonary disease, unspecified: Secondary | ICD-10-CM | POA: Diagnosis not present

## 2023-07-07 DIAGNOSIS — F0283 Dementia in other diseases classified elsewhere, unspecified severity, with mood disturbance: Secondary | ICD-10-CM | POA: Diagnosis not present

## 2023-07-08 DIAGNOSIS — F329 Major depressive disorder, single episode, unspecified: Secondary | ICD-10-CM | POA: Diagnosis not present

## 2023-07-08 DIAGNOSIS — J449 Chronic obstructive pulmonary disease, unspecified: Secondary | ICD-10-CM | POA: Diagnosis not present

## 2023-07-08 DIAGNOSIS — E039 Hypothyroidism, unspecified: Secondary | ICD-10-CM | POA: Diagnosis not present

## 2023-07-08 DIAGNOSIS — F0283 Dementia in other diseases classified elsewhere, unspecified severity, with mood disturbance: Secondary | ICD-10-CM | POA: Diagnosis not present

## 2023-07-08 DIAGNOSIS — D693 Immune thrombocytopenic purpura: Secondary | ICD-10-CM | POA: Diagnosis not present

## 2023-07-08 DIAGNOSIS — K8070 Calculus of gallbladder and bile duct without cholecystitis without obstruction: Secondary | ICD-10-CM | POA: Diagnosis not present

## 2023-07-14 DIAGNOSIS — F329 Major depressive disorder, single episode, unspecified: Secondary | ICD-10-CM | POA: Diagnosis not present

## 2023-07-14 DIAGNOSIS — J449 Chronic obstructive pulmonary disease, unspecified: Secondary | ICD-10-CM | POA: Diagnosis not present

## 2023-07-14 DIAGNOSIS — K8070 Calculus of gallbladder and bile duct without cholecystitis without obstruction: Secondary | ICD-10-CM | POA: Diagnosis not present

## 2023-07-14 DIAGNOSIS — F0283 Dementia in other diseases classified elsewhere, unspecified severity, with mood disturbance: Secondary | ICD-10-CM | POA: Diagnosis not present

## 2023-07-14 DIAGNOSIS — E039 Hypothyroidism, unspecified: Secondary | ICD-10-CM | POA: Diagnosis not present

## 2023-07-14 DIAGNOSIS — D693 Immune thrombocytopenic purpura: Secondary | ICD-10-CM | POA: Diagnosis not present

## 2023-07-15 DIAGNOSIS — F0283 Dementia in other diseases classified elsewhere, unspecified severity, with mood disturbance: Secondary | ICD-10-CM | POA: Diagnosis not present

## 2023-07-15 DIAGNOSIS — J449 Chronic obstructive pulmonary disease, unspecified: Secondary | ICD-10-CM | POA: Diagnosis not present

## 2023-07-15 DIAGNOSIS — E039 Hypothyroidism, unspecified: Secondary | ICD-10-CM | POA: Diagnosis not present

## 2023-07-15 DIAGNOSIS — D693 Immune thrombocytopenic purpura: Secondary | ICD-10-CM | POA: Diagnosis not present

## 2023-07-15 DIAGNOSIS — F329 Major depressive disorder, single episode, unspecified: Secondary | ICD-10-CM | POA: Diagnosis not present

## 2023-07-15 DIAGNOSIS — K8070 Calculus of gallbladder and bile duct without cholecystitis without obstruction: Secondary | ICD-10-CM | POA: Diagnosis not present

## 2023-07-19 DIAGNOSIS — J449 Chronic obstructive pulmonary disease, unspecified: Secondary | ICD-10-CM | POA: Diagnosis not present

## 2023-07-19 DIAGNOSIS — F329 Major depressive disorder, single episode, unspecified: Secondary | ICD-10-CM | POA: Diagnosis not present

## 2023-07-19 DIAGNOSIS — D693 Immune thrombocytopenic purpura: Secondary | ICD-10-CM | POA: Diagnosis not present

## 2023-07-19 DIAGNOSIS — F0283 Dementia in other diseases classified elsewhere, unspecified severity, with mood disturbance: Secondary | ICD-10-CM | POA: Diagnosis not present

## 2023-07-19 DIAGNOSIS — E039 Hypothyroidism, unspecified: Secondary | ICD-10-CM | POA: Diagnosis not present

## 2023-07-19 DIAGNOSIS — K8070 Calculus of gallbladder and bile duct without cholecystitis without obstruction: Secondary | ICD-10-CM | POA: Diagnosis not present

## 2023-07-21 DIAGNOSIS — F0283 Dementia in other diseases classified elsewhere, unspecified severity, with mood disturbance: Secondary | ICD-10-CM | POA: Diagnosis not present

## 2023-07-21 DIAGNOSIS — J449 Chronic obstructive pulmonary disease, unspecified: Secondary | ICD-10-CM | POA: Diagnosis not present

## 2023-07-21 DIAGNOSIS — F329 Major depressive disorder, single episode, unspecified: Secondary | ICD-10-CM | POA: Diagnosis not present

## 2023-07-21 DIAGNOSIS — K8070 Calculus of gallbladder and bile duct without cholecystitis without obstruction: Secondary | ICD-10-CM | POA: Diagnosis not present

## 2023-07-21 DIAGNOSIS — E039 Hypothyroidism, unspecified: Secondary | ICD-10-CM | POA: Diagnosis not present

## 2023-07-21 DIAGNOSIS — D693 Immune thrombocytopenic purpura: Secondary | ICD-10-CM | POA: Diagnosis not present

## 2023-07-26 DIAGNOSIS — F329 Major depressive disorder, single episode, unspecified: Secondary | ICD-10-CM | POA: Diagnosis not present

## 2023-07-26 DIAGNOSIS — F0283 Dementia in other diseases classified elsewhere, unspecified severity, with mood disturbance: Secondary | ICD-10-CM | POA: Diagnosis not present

## 2023-07-26 DIAGNOSIS — J449 Chronic obstructive pulmonary disease, unspecified: Secondary | ICD-10-CM | POA: Diagnosis not present

## 2023-07-26 DIAGNOSIS — K8070 Calculus of gallbladder and bile duct without cholecystitis without obstruction: Secondary | ICD-10-CM | POA: Diagnosis not present

## 2023-07-26 DIAGNOSIS — D693 Immune thrombocytopenic purpura: Secondary | ICD-10-CM | POA: Diagnosis not present

## 2023-07-26 DIAGNOSIS — E039 Hypothyroidism, unspecified: Secondary | ICD-10-CM | POA: Diagnosis not present

## 2023-07-29 DIAGNOSIS — F0283 Dementia in other diseases classified elsewhere, unspecified severity, with mood disturbance: Secondary | ICD-10-CM | POA: Diagnosis not present

## 2023-07-29 DIAGNOSIS — E039 Hypothyroidism, unspecified: Secondary | ICD-10-CM | POA: Diagnosis not present

## 2023-07-29 DIAGNOSIS — J449 Chronic obstructive pulmonary disease, unspecified: Secondary | ICD-10-CM | POA: Diagnosis not present

## 2023-07-29 DIAGNOSIS — F329 Major depressive disorder, single episode, unspecified: Secondary | ICD-10-CM | POA: Diagnosis not present

## 2023-07-29 DIAGNOSIS — K8070 Calculus of gallbladder and bile duct without cholecystitis without obstruction: Secondary | ICD-10-CM | POA: Diagnosis not present

## 2023-07-29 DIAGNOSIS — D693 Immune thrombocytopenic purpura: Secondary | ICD-10-CM | POA: Diagnosis not present

## 2023-08-02 DIAGNOSIS — F0283 Dementia in other diseases classified elsewhere, unspecified severity, with mood disturbance: Secondary | ICD-10-CM | POA: Diagnosis not present

## 2023-08-02 DIAGNOSIS — J449 Chronic obstructive pulmonary disease, unspecified: Secondary | ICD-10-CM | POA: Diagnosis not present

## 2023-08-02 DIAGNOSIS — F329 Major depressive disorder, single episode, unspecified: Secondary | ICD-10-CM | POA: Diagnosis not present

## 2023-08-02 DIAGNOSIS — E039 Hypothyroidism, unspecified: Secondary | ICD-10-CM | POA: Diagnosis not present

## 2023-08-02 DIAGNOSIS — D693 Immune thrombocytopenic purpura: Secondary | ICD-10-CM | POA: Diagnosis not present

## 2023-08-02 DIAGNOSIS — K8070 Calculus of gallbladder and bile duct without cholecystitis without obstruction: Secondary | ICD-10-CM | POA: Diagnosis not present

## 2023-08-04 DIAGNOSIS — F0283 Dementia in other diseases classified elsewhere, unspecified severity, with mood disturbance: Secondary | ICD-10-CM | POA: Diagnosis not present

## 2023-08-04 DIAGNOSIS — E039 Hypothyroidism, unspecified: Secondary | ICD-10-CM | POA: Diagnosis not present

## 2023-08-04 DIAGNOSIS — J449 Chronic obstructive pulmonary disease, unspecified: Secondary | ICD-10-CM | POA: Diagnosis not present

## 2023-08-04 DIAGNOSIS — K8070 Calculus of gallbladder and bile duct without cholecystitis without obstruction: Secondary | ICD-10-CM | POA: Diagnosis not present

## 2023-08-04 DIAGNOSIS — D693 Immune thrombocytopenic purpura: Secondary | ICD-10-CM | POA: Diagnosis not present

## 2023-08-04 DIAGNOSIS — F329 Major depressive disorder, single episode, unspecified: Secondary | ICD-10-CM | POA: Diagnosis not present

## 2023-08-17 DIAGNOSIS — M810 Age-related osteoporosis without current pathological fracture: Secondary | ICD-10-CM | POA: Diagnosis not present

## 2023-08-18 ENCOUNTER — Ambulatory Visit: Payer: Medicare Other | Admitting: Podiatry

## 2023-09-01 ENCOUNTER — Emergency Department (HOSPITAL_BASED_OUTPATIENT_CLINIC_OR_DEPARTMENT_OTHER)
Admission: EM | Admit: 2023-09-01 | Discharge: 2023-09-01 | Disposition: A | Discharge status: Home / Self Care | Attending: Emergency Medicine | Admitting: Emergency Medicine

## 2023-09-01 ENCOUNTER — Other Ambulatory Visit: Payer: Self-pay

## 2023-09-01 ENCOUNTER — Emergency Department (HOSPITAL_BASED_OUTPATIENT_CLINIC_OR_DEPARTMENT_OTHER): Admitting: Radiology

## 2023-09-01 DIAGNOSIS — E039 Hypothyroidism, unspecified: Secondary | ICD-10-CM | POA: Insufficient documentation

## 2023-09-01 DIAGNOSIS — I700 Atherosclerosis of aorta: Secondary | ICD-10-CM | POA: Diagnosis not present

## 2023-09-01 DIAGNOSIS — W06XXXA Fall from bed, initial encounter: Secondary | ICD-10-CM | POA: Insufficient documentation

## 2023-09-01 DIAGNOSIS — I517 Cardiomegaly: Secondary | ICD-10-CM | POA: Diagnosis not present

## 2023-09-01 DIAGNOSIS — W19XXXA Unspecified fall, initial encounter: Secondary | ICD-10-CM

## 2023-09-01 DIAGNOSIS — R0781 Pleurodynia: Secondary | ICD-10-CM | POA: Diagnosis not present

## 2023-09-01 DIAGNOSIS — R0789 Other chest pain: Secondary | ICD-10-CM | POA: Insufficient documentation

## 2023-09-01 DIAGNOSIS — Z7982 Long term (current) use of aspirin: Secondary | ICD-10-CM | POA: Insufficient documentation

## 2023-09-01 MED ORDER — LIDOCAINE 5 % EX PTCH: 1.0000 | MEDICATED_PATCH | CUTANEOUS | 0 refills | Status: AC

## 2023-09-01 NOTE — ED Provider Notes (Signed)
 [place] EMERGENCY DEPARTMENT AT [facility] Provider Note   CSN: [id number] Arrival date & time: 09/01/23  2140     History  No chief complaint on file.   LATAZIA CHAMBER is a 89 y.o. female.  The history is provided by the patient and medical records. No language interpreter was used.  Fall This is a new problem. The current episode started less than 1 hour ago. The problem occurs constantly. The problem has not changed since onset.Associated symptoms include chest pain. Pertinent negatives include no abdominal pain, no headaches and no shortness of breath. Nothing aggravates the symptoms. Nothing relieves the symptoms. She has tried nothing for the symptoms. The treatment provided no relief.       Home Medications Prior to Admission medications   Medication Sig Start Date End Date Taking? Authorizing Provider  acetaminophen  (TYLENOL ) 500 MG tablet Take 1 tablet (500 mg total) by mouth every 6 (six) hours as needed for mild pain. 04/19/16   Kelsey Patricia, MD  aspirin  81 MG tablet Take 81 mg by mouth daily.    [provider]  betamethasone  dipropionate 0.05 % cream     [provider]  cholecalciferol (VITAMIN D ) 1000 UNITS tablet Take 1,000 Units by mouth daily.    [provider]  citalopram  (CELEXA ) 40 MG tablet Take 40 mg by mouth daily.     [provider]  Clobetasol Propionate 0.05 % shampoo     [provider]  Coenzyme Q10 (CO Q 10) 100 MG CAPS See admin instructions.    [provider]  Cyanocobalamin  (VITAMIN B-12 PO) Take 1,000 mcg by mouth daily.    [provider]  diclofenac  Sodium (VOLTAREN ) 1 % GEL     [provider]  ergocalciferol  (VITAMIN D2) 1.25 MG (50000 UT) capsule  06/18/18   [provider]  esomeprazole (NEXIUM) 40 MG capsule Take 40 mg by mouth 2 (two) times daily before a meal.    [provider]  famciclovir  (FAMVIR ) 125 MG tablet  08/15/18    [provider]  Iron , Ferrous Sulfate , 325 (65 Fe) MG TABS Take 1 tablet by mouth every other day. 04/06/23   Ree Candy, MD  levothyroxine  (SYNTHROID , LEVOTHROID) 75 MCG tablet Take 75 mcg by mouth daily before breakfast.     [provider]  lidocaine  (LIDODERM ) 5 % Place 1 patch onto the skin daily. Remove & Discard patch within 12 hours or as directed by MD 03/17/23   Iva Mariner, MD  lovastatin (MEVACOR) 20 MG tablet TAKE 1 TABLET BY MOUTH ONCEDAILY AT BEDTIME    [provider]  magnesium  oxide (MAG-OX) 400 MG tablet Take 400 mg by mouth daily.    [provider]  methocarbamol  (ROBAXIN ) 500 MG tablet Take 1 tablet (500 mg total) by mouth every 8 (eight) hours as needed for muscle spasms. 03/17/23   Iva Mariner, MD  metoprolol  succinate (TOPROL -XL) 25 MG 24 hr tablet Take 25 mg by mouth daily.     [provider]  mirabegron  ER (MYRBETRIQ ) 25 MG TB24 tablet Take 25 mg by mouth daily.    [provider]  Multiple Vitamins-Minerals (MULTIVITAMINS THER. W/MINERALS) TABS Take 1 tablet by mouth daily.    [provider]  nystatin-triamcinolone  ointment (MYCOLOG) Apply 1 Application topically 2 (two) times daily.    [provider]  ondansetron  (ZOFRAN -ODT) 4 MG disintegrating tablet Take 1 tablet (4 mg total) by mouth every 8 (eight) hours as  needed for nausea or vomiting. 03/29/23   Kommor, [place], MD  oxyCODONE  (ROXICODONE ) 5 MG immediate release tablet Take 0.5 tablets (2.5 mg total) by mouth every 4 (four) hours as needed. 05/23/23   Ninetta Basket, MD  Polyethyl Glycol-Propyl Glycol 0.4-0.3 % SOLN Apply 2 drops to eye 2 (two) times daily as needed (for dry eyes).    [provider]  pregabalin  (LYRICA ) 50 MG capsule Take 50 mg by mouth 2 (two) times daily.    [provider]  Probiotic Product (PROBIOTIC PO) Take 1 tablet by mouth daily.    [provider]  traMADol  (ULTRAM ) 50 MG  tablet     [provider]      Allergies    Oxycodone , Oxycodone  hcl, Sulfamethoxazole-trimethoprim, Codeine, Doxycycline, Meperidine and related, Sulfa antibiotics, and Sulfacetamide sodium    Review of Systems   Review of Systems  Constitutional:  Negative for chills, fatigue and fever.  HENT:  Negative for congestion.   Respiratory:  Negative for cough, chest tightness, shortness of breath and wheezing.   Cardiovascular:  Positive for chest pain. Negative for palpitations and leg swelling.  Gastrointestinal:  Negative for abdominal pain, constipation, diarrhea, nausea and vomiting.  Genitourinary:  Negative for dysuria.  Musculoskeletal:  Negative for back pain and neck pain.  Skin:  Negative for rash and wound.  Neurological:  Negative for weakness, light-headedness, numbness and headaches.  Psychiatric/Behavioral:  Negative for agitation.   All other systems reviewed and are negative.   Physical Exam Updated Vital Signs BP 117/70   Pulse (!) 56   Temp 98.7 F (37.1 C)   Resp 17   SpO2 95%  Physical Exam Vitals and nursing note reviewed.  Constitutional:      General: She is not in acute distress.    Appearance: She is well-developed. She is not ill-appearing, toxic-appearing or diaphoretic.  HENT:     Head: Normocephalic and atraumatic.     Nose: Nose normal.     Mouth/Throat:     Mouth: Mucous membranes are moist.  Eyes:     Conjunctiva/sclera: Conjunctivae normal.  Cardiovascular:     Rate and Rhythm: Normal rate and regular rhythm.     Heart sounds: No murmur heard. Pulmonary:     Effort: Pulmonary effort is normal. No respiratory distress.     Breath sounds: Normal breath sounds. No wheezing, rhonchi or rales.  Chest:     Chest wall: Tenderness present.    Abdominal:     General: Abdomen is flat.     Palpations: Abdomen is soft.     Tenderness: There is no abdominal tenderness. There is no guarding or rebound.  Musculoskeletal:         General: Tenderness present. No swelling.     Cervical back: Neck supple.  Skin:    General: Skin is warm and dry.     Capillary Refill: Capillary refill takes less than 2 seconds.     Findings: No erythema or rash.  Neurological:     General: No focal deficit present.     Mental Status: She is alert.  Psychiatric:        Mood and Affect: Mood normal.     ED Results / Procedures / Treatments   Labs (all labs ordered are listed, but only abnormal results are displayed) Labs Reviewed - No data to display  EKG None  Radiology DG Ribs Unilateral W/Chest Right Result Date: 09/01/2023 EXAM: 3 VIEW(S) XRAY OF THE RIGHT RIBS  AND CHEST 09/01/2023 10:39:10 PM COMPARISON: None available. CLINICAL HISTORY: Marvell Slider backwards using walker at home. Struck side of mattress. Right rib pain. Denies striking head. No thinners. ASA only. No LOC. No neck pain. Ambulatory to triage with walker. Ox4. FINDINGS: BONES: No acute displaced right rib fracture. Old left rib fracture deformities. LUNGS AND PLEURA: No consolidation or pulmonary edema. No pleural effusion or pneumothorax. HEART AND MEDIASTINUM: Mild cardiomegaly. Thoracic aortic atherosclerosis. SOFT TISSUES: Surgical clips along the right lateral chest wall. IMPRESSION: 1. No acute displaced right rib fracture. 2. Mild cardiomegaly. No acute findings. Electronically signed by: Zadie Herter MD 09/01/2023 10:45 PM EDT RP Workstation: [id number]    Procedures Procedures    Medications Ordered in ED Medications - No data to display  ED Course/ Medical Decision Making/ A&P                                 Medical Decision Making Amount and/or Complexity of Data Reviewed Radiology: ordered.  Risk Prescription drug management.    AMANDRA PELOT is a 89 y.o. female with a past medical history significant for hypothyroidism, dyslipidemia, osteoporosis, ITP, previous lymphoma, and GERD who presents with a fall.  According to patient, she  was using her walker when she tipped backwards and fell onto the mattress on her bed hurting her right chest wall.  She did not lose consciousness and hurt her head.  She denies any pain in her anterior chest, abdomen, back, neck, or extremities.  She did not have any shortness of breath.  Is not her when she takes deep breath at all hurts when she pushes on her right lateral chest wall.  She is never broken any ribs and denies any other complaints.  She denies any preceding symptoms such as palpitations, fevers, chills, nausea, vomiting, constipation, diarrhea, or urinary changes.  She is feeling fairly well now.  On exam, lungs clear.  Right lateral chest wall was tender to palpation but rest of chest was not.  No crepitance.  Normal bowel sounds.  Patient moving all extremities.  Patient making jokes and thinking very clearly.  Pupil symmetric and reactive with normal extraocular movements.  No focal neurologic deficits.  We had a shared decision-making conversation with patient and family and agreed to get a chest x-ray with rib focus and hold on CT imaging or labs given her reassuring exam.  They agree.  X-ray reassuring.  Low suspicion for occult injury or more significant trauma.  Patient given prescription for Lidoderm  patches and she has over-the-counter medication she can use as well.  They will follow-up with PCP and understood return precautions for new or worsened symptoms.  She had no other questions or concerns and was discharged in good condition.                Final Clinical Impression(s) / ED Diagnoses Final diagnoses:  Right-sided chest wall pain  Fall, initial encounter    Rx / DC Orders ED Discharge Orders          Ordered    lidocaine  (LIDODERM ) 5 %  Every 24 hours        09/01/23 2305           Clinical Impression: 1. Right-sided chest wall pain   2. Fall, initial encounter     Disposition: Discharge  Condition: Good  I have discussed the results, Dx  and Tx plan with the pt(&  family if present). He/she/they expressed understanding and agree(s) with the plan. Discharge instructions discussed at great length. Strict return precautions discussed and pt &/or family have verbalized understanding of the instructions. No further questions at time of discharge.    New Prescriptions   LIDOCAINE  (LIDODERM ) 5 %    Place 1 patch onto the skin daily. Remove & Discard patch within 12 hours or as directed by MD    Follow Up: Jhon Moselle, FNP [address] [place] [address] [place] [address] [phone]     [facility] Emergency Department at [facility] [address] [place]  [address] [phone]       Bettey Muraoka, Marine Sia, MD 09/01/23 2329

## 2023-09-01 NOTE — ED Notes (Signed)
 RN reviewed discharge instructions with pt. Pt verbalized understanding and had no further questions. VSS upon discharge.

## 2023-09-01 NOTE — Discharge Instructions (Signed)
 Your history, exam, and evaluation today are consistent with soft tissue injury and musculoskeletal spasm and pain on the right chest wall where you fell tonight.  The x-ray did not show any rib fractures or other concerning finding.  Given your well appearance and reassuring vital signs, we feel you are safe for discharge home.  We agreed together to hold on more extensive lab testing or CT imaging today.  Please rest and stay hydrated and use the numbing patches.  Please follow-up with your primary doctor.  If any symptoms change or worsen acutely, please return to the nearest emergency department.

## 2023-09-01 NOTE — ED Triage Notes (Signed)
 Fell backwards using walker at home. Struck side of mattress. Right rib pain. Denies striking head. No thinners. ASA only. No LOC. No neck pain. Ambulatory to triage with walker. Ox4.

## 2023-09-02 DIAGNOSIS — M24549 Contracture, unspecified hand: Secondary | ICD-10-CM | POA: Diagnosis not present

## 2023-09-05 ENCOUNTER — Emergency Department (HOSPITAL_COMMUNITY)
Admission: EM | Admit: 2023-09-05 | Discharge: 2023-09-05 | Disposition: A | Discharge status: Home / Self Care | Attending: Emergency Medicine | Admitting: Emergency Medicine

## 2023-09-05 ENCOUNTER — Encounter (HOSPITAL_COMMUNITY): Payer: Self-pay

## 2023-09-05 ENCOUNTER — Emergency Department (HOSPITAL_COMMUNITY)

## 2023-09-05 ENCOUNTER — Other Ambulatory Visit: Payer: Self-pay

## 2023-09-05 DIAGNOSIS — I498 Other specified cardiac arrhythmias: Secondary | ICD-10-CM

## 2023-09-05 DIAGNOSIS — M549 Dorsalgia, unspecified: Secondary | ICD-10-CM | POA: Diagnosis not present

## 2023-09-05 DIAGNOSIS — W19XXXA Unspecified fall, initial encounter: Secondary | ICD-10-CM | POA: Insufficient documentation

## 2023-09-05 DIAGNOSIS — J449 Chronic obstructive pulmonary disease, unspecified: Secondary | ICD-10-CM | POA: Insufficient documentation

## 2023-09-05 DIAGNOSIS — I771 Stricture of artery: Secondary | ICD-10-CM | POA: Diagnosis not present

## 2023-09-05 DIAGNOSIS — S2242XA Multiple fractures of ribs, left side, initial encounter for closed fracture: Secondary | ICD-10-CM | POA: Diagnosis not present

## 2023-09-05 DIAGNOSIS — Z79899 Other long term (current) drug therapy: Secondary | ICD-10-CM | POA: Diagnosis not present

## 2023-09-05 DIAGNOSIS — R0781 Pleurodynia: Secondary | ICD-10-CM | POA: Insufficient documentation

## 2023-09-05 DIAGNOSIS — Z7982 Long term (current) use of aspirin: Secondary | ICD-10-CM | POA: Insufficient documentation

## 2023-09-05 DIAGNOSIS — R0789 Other chest pain: Secondary | ICD-10-CM | POA: Diagnosis not present

## 2023-09-05 DIAGNOSIS — E039 Hypothyroidism, unspecified: Secondary | ICD-10-CM | POA: Diagnosis not present

## 2023-09-05 DIAGNOSIS — Y92009 Unspecified place in unspecified non-institutional (private) residence as the place of occurrence of the external cause: Secondary | ICD-10-CM | POA: Diagnosis not present

## 2023-09-05 DIAGNOSIS — S2220XA Unspecified fracture of sternum, initial encounter for closed fracture: Secondary | ICD-10-CM | POA: Diagnosis not present

## 2023-09-05 DIAGNOSIS — R008 Other abnormalities of heart beat: Secondary | ICD-10-CM | POA: Insufficient documentation

## 2023-09-05 DIAGNOSIS — I10 Essential (primary) hypertension: Secondary | ICD-10-CM | POA: Diagnosis not present

## 2023-09-05 DIAGNOSIS — I700 Atherosclerosis of aorta: Secondary | ICD-10-CM | POA: Diagnosis not present

## 2023-09-05 LAB — CBC WITH DIFFERENTIAL/PLATELET
Abs Immature Granulocytes: 0.04 10*3/uL (ref 0.00–0.07)
Basophils Absolute: 0.1 10*3/uL (ref 0.0–0.1)
Basophils Relative: 1 %
Eosinophils Absolute: 0.1 10*3/uL (ref 0.0–0.5)
Eosinophils Relative: 1 %
HCT: 41.1 % (ref 36.0–46.0)
Hemoglobin: 13.3 g/dL (ref 12.0–15.0)
Immature Granulocytes: 1 %
Lymphocytes Relative: 19 %
Lymphs Abs: 1.5 10*3/uL (ref 0.7–4.0)
MCH: 30.3 pg (ref 26.0–34.0)
MCHC: 32.4 g/dL (ref 30.0–36.0)
MCV: 93.6 fL (ref 80.0–100.0)
Monocytes Absolute: 0.5 10*3/uL (ref 0.1–1.0)
Monocytes Relative: 7 %
Neutro Abs: 5.4 10*3/uL (ref 1.7–7.7)
Neutrophils Relative %: 71 %
Platelets: 116 10*3/uL — ABNORMAL LOW (ref 150–400)
RBC: 4.39 MIL/uL (ref 3.87–5.11)
RDW: 13.5 % (ref 11.5–15.5)
WBC: 7.5 10*3/uL (ref 4.0–10.5)
nRBC: 0 % (ref 0.0–0.2)

## 2023-09-05 LAB — BASIC METABOLIC PANEL WITH GFR
Anion gap: 11 (ref 5–15)
BUN: 10 mg/dL (ref 8–23)
CO2: 20 mmol/L — ABNORMAL LOW (ref 22–32)
Calcium: 9.3 mg/dL (ref 8.9–10.3)
Chloride: 107 mmol/L (ref 98–111)
Creatinine, Ser: 0.6 mg/dL (ref 0.44–1.00)
GFR, Estimated: >60 mL/min (ref >60)
Glucose, Bld: 94 mg/dL (ref 70–99)
Potassium: 3.9 mmol/L (ref 3.5–5.1)
Sodium: 138 mmol/L (ref 135–145)

## 2023-09-05 LAB — CBG MONITORING, ED: Glucose-Capillary: 87 mg/dL (ref 70–99)

## 2023-09-05 MED ORDER — ACETAMINOPHEN 500 MG PO TABS
1000.0000 mg | ORAL_TABLET | Freq: Once | ORAL | Status: AC
  Administered 2023-09-05: 1000 mg via ORAL
  Filled 2023-09-05: qty 2

## 2023-09-05 NOTE — ED Provider Notes (Signed)
 [place] EMERGENCY DEPARTMENT AT [facility] Provider Note   CSN: [id number] Arrival date & time: 09/05/23  1511     History  No chief complaint on file.   Darlene [name] is a 89 y.o. female.  Pt 89y/o female with hx of COPD, hypothyroidism, dyslipidemia, osteoporosis, ITP, previous lymphoma, and GERD who is presenting today with EMS after a fall at home today.  Patient reports she was standing at her dresser and she did not have her walking device and she stepped backwards losing her balance falling to the floor.  She denies hitting her head or loss of consciousness.  She reports she did fall earlier this week and had some pain in the posterior rib area on the right but was evaluated and told everything was okay.  She reports it has been a little sore and she has been wearing a patch but is otherwise been okay.  She denies any specific new pain from the fall today except to report that the area that was already sore may be a little bit sore.  she is not have any chest pain or shortness of breath. She denies dizziness or feeling poorly before the fall.  She states she does not have good balance and falls intermittently.  She denies abd pain, n/v/d.  No recent medication changes.  She reports they called EMS out just to help her get up but when EMS arrived they noted that she had an irregular heartbeat and found her to be in bigeminy.  Because of this that they insisted that she be transported to the hospital.  Patient reports she feels fine at this time.  The history is provided by the patient.       Home Medications Prior to Admission medications   Medication Sig Start Date End Date Taking? Authorizing Provider  acetaminophen  (TYLENOL ) 500 MG tablet Take 1 tablet (500 mg total) by mouth every 6 (six) hours as needed for mild pain. 04/19/16   Kelsey Patricia, MD  aspirin  81 MG tablet Take 81 mg by mouth daily.    [provider]  betamethasone  dipropionate 0.05 %  cream     [provider]  cholecalciferol (VITAMIN D ) 1000 UNITS tablet Take 1,000 Units by mouth daily.    [provider]  citalopram  (CELEXA ) 40 MG tablet Take 40 mg by mouth daily.     [provider]  Clobetasol Propionate 0.05 % shampoo     [provider]  Coenzyme Q10 (CO Q 10) 100 MG CAPS See admin instructions.    [provider]  Cyanocobalamin  (VITAMIN B-12 PO) Take 1,000 mcg by mouth daily.    [provider]  diclofenac  Sodium (VOLTAREN ) 1 % GEL     [provider]  ergocalciferol  (VITAMIN D2) 1.25 MG (50000 UT) capsule  06/18/18   [provider]  esomeprazole (NEXIUM) 40 MG capsule Take 40 mg by mouth 2 (two) times daily before a meal.    [provider]  famciclovir  (FAMVIR ) 125 MG tablet  08/15/18   [provider]  Iron , Ferrous Sulfate , 325 (65 Fe) MG TABS Take 1 tablet by mouth every other day. 04/06/23   Ree Candy, MD  levothyroxine  (SYNTHROID , LEVOTHROID) 75 MCG tablet Take 75 mcg by mouth daily before breakfast.     [provider]  lidocaine  (LIDODERM ) 5 % Place 1 patch onto the skin daily. Remove & Discard patch within 12 hours or as directed by MD 03/17/23  Iva Mariner, MD  lidocaine  (LIDODERM ) 5 % Place 1 patch onto the skin daily. Remove & Discard patch within 12 hours or as directed by MD 09/01/23   Tegeler, Marine Sia, MD  lovastatin (MEVACOR) 20 MG tablet TAKE 1 TABLET BY MOUTH ONCEDAILY AT BEDTIME    [provider]  magnesium  oxide (MAG-OX) 400 MG tablet Take 400 mg by mouth daily.    [provider]  methocarbamol  (ROBAXIN ) 500 MG tablet Take 1 tablet (500 mg total) by mouth every 8 (eight) hours as needed for muscle spasms. 03/17/23   Iva Mariner, MD  metoprolol  succinate (TOPROL -XL) 25 MG 24 hr tablet Take 25 mg by mouth daily.     [provider]  mirabegron  ER (MYRBETRIQ ) 25 MG TB24 tablet Take 25 mg by mouth daily.     [provider]  Multiple Vitamins-Minerals (MULTIVITAMINS THER. W/MINERALS) TABS Take 1 tablet by mouth daily.    [provider]  nystatin-triamcinolone  ointment (MYCOLOG) Apply 1 Application topically 2 (two) times daily.    [provider]  ondansetron  (ZOFRAN -ODT) 4 MG disintegrating tablet Take 1 tablet (4 mg total) by mouth every 8 (eight) hours as needed for nausea or vomiting. 03/29/23   Kommor, [place], MD  oxyCODONE  (ROXICODONE ) 5 MG immediate release tablet Take 0.5 tablets (2.5 mg total) by mouth every 4 (four) hours as needed. 05/23/23   Ninetta Basket, MD  Polyethyl Glycol-Propyl Glycol 0.4-0.3 % SOLN Apply 2 drops to eye 2 (two) times daily as needed (for dry eyes).    [provider]  pregabalin  (LYRICA ) 50 MG capsule Take 50 mg by mouth 2 (two) times daily.    [provider]  Probiotic Product (PROBIOTIC PO) Take 1 tablet by mouth daily.    [provider]  traMADol  (ULTRAM ) 50 MG tablet     [provider]      Allergies    Oxycodone , Oxycodone  hcl, Sulfamethoxazole-trimethoprim, Codeine, Doxycycline, Meperidine and related, Sulfa antibiotics, and Sulfacetamide sodium    Review of Systems   Review of Systems  Physical Exam Updated Vital Signs BP (!) 150/62   Pulse 67   Temp 97.7 F (36.5 C) (Oral)   Resp 15   Ht 5\' 2"  (1.575 m)   SpO2 96%   BMI 30.18 kg/m  Physical Exam Vitals and nursing note reviewed.  Constitutional:      General: She is not in acute distress.    Appearance: She is well-developed.  HENT:     Head: Normocephalic and atraumatic.  Eyes:     Extraocular Movements: Extraocular movements intact.     Pupils: Pupils are equal, round, and reactive to light.  Cardiovascular:     Rate and Rhythm: Normal rate and regular rhythm. Occasional Extrasystoles are present.    Heart sounds: Normal heart sounds. No murmur heard.    No friction rub.  Pulmonary:     Effort: Pulmonary effort  is normal.     Breath sounds: Normal breath sounds. No wheezing or rales.    Chest:     Chest wall: Tenderness present.  Abdominal:     General: Bowel sounds are normal. There is no distension.     Palpations: Abdomen is soft.     Tenderness: There is no abdominal tenderness. There is no guarding or rebound.  Musculoskeletal:        General: No tenderness. Normal range of motion.     Right lower leg: Edema present.     Left lower  leg: Edema present.     Comments: Trace edema bilateral lower extremities.  Significant arthritic deformities of the right foot  Skin:    General: Skin is warm and dry.     Findings: No rash.  Neurological:     Mental Status: She is alert and oriented to person, place, and time.     Cranial Nerves: No cranial nerve deficit.     Sensory: No sensory deficit.     Motor: No weakness.  Psychiatric:        Behavior: Behavior normal.     ED Results / Procedures / Treatments   Labs (all labs ordered are listed, but only abnormal results are displayed) Labs Reviewed  CBC WITH DIFFERENTIAL/PLATELET - Abnormal; Notable for the following components:      Result Value   Platelets 116 (*)    All other components within normal limits  BASIC METABOLIC PANEL WITH GFR - Abnormal; Notable for the following components:   CO2 20 (*)    All other components within normal limits  CBG MONITORING, ED    EKG EKG Interpretation Date/Time:  Sunday Sep 05 2023 15:31:40 EDT Ventricular Rate:  71 PR Interval:  189 QRS Duration:  102 QT Interval:  411 QTC Calculation: 447 R Axis:   41  Text Interpretation: Sinus rhythm Ventricular premature complex No significant change since last tracing Confirmed by Almond Army ([id number]) on 09/05/2023 3:46:07 PM  Radiology DG Chest 2 View Result Date: 09/05/2023 CLINICAL DATA:  Right-sided chest pain.  Patient fell. EXAM: CHEST - 2 VIEW COMPARISON:  Chest x-ray 09/01/2023 FINDINGS: The cardiac silhouette, mediastinal and hilar  contours are stable. Stable tortuosity and calcification of the thoracic aorta. Stable right basilar scarring changes. No infiltrates or effusions. No pneumothorax. There is a displaced sternal fracture with posterior displacement. No obvious large substernal hematoma. Chest CT may be helpful for further evaluation. No acute rib fractures are identified. Remote healed left rib fractures are noted along with advanced degenerative changes involving both shoulders. IMPRESSION: 1. Displaced sternal fracture. CT may be helpful for further evaluation. 2. No acute pulmonary findings. Electronically Signed   By: Marrian Siva M.D.   On: 09/05/2023 18:18    Procedures Procedures    Medications Ordered in ED Medications  acetaminophen  (TYLENOL ) tablet 1,000 mg (1,000 mg Oral Given 09/05/23 1716)    ED Course/ Medical Decision Making/ A&P                                 Medical Decision Making Amount and/or Complexity of Data Reviewed Independent Historian: EMS Labs: ordered. Decision-making details documented in ED Course. Radiology: ordered and independent interpretation performed. Decision-making details documented in ED Course. ECG/medicine tests: ordered and independent interpretation performed. Decision-making details documented in ED Course.  Risk OTC drugs.   Pt with multiple medical problems and comorbidities and presenting today with a complaint that caries a high risk for morbidity and mortality.  Presenting today because EMS found patient to be bradycardic in bigeminy after a fall.  However patient can describe the fall quite vividly and denies any prodromal symptoms.  She reports moving backwards without her cane and losing her balance.  She does not report feeling ill or unwell.  She did have a fall within a week for similar reasons because she was not using her cane.  She is well-appearing on exam with reassuring vital signs.  I independently interpreted patient's EKG  and here she has 1  PVC but no evidence of bigeminy however the EKG that I independently interpreted by EMS did show bigeminy.  While she was in the bigeminy she was asymptomatic.  She does take metoprolol  regularly for her palpitations and denies missing any medications.  She has no other signs of injury.  Will check labs to ensure no other possible causes for her having falls other than not using her walking device.  No signs or reports of head injury that would require imaging at this time.  Patient does not take anticoagulation.  She is otherwise neurologically intact and low suspicion for stroke.  6:29 PM I independently interpreted patient's continuous cardiac monitor.  At times patient is in bigeminy and then will go back into a sinus rhythm with only very infrequent PVCs.  She remains asymptomatic from this.  Heart rate remains normal rate.  I independently interpreted patient's labs and CBC, BMP and troponin are all within normal limits.  I have independently visualized and interpreted pt's images today. Chest x-ray today without evidence of pneumothorax or acute rib fracture.  Radiology reports a displaced sternal fracture without other findings.  Patient has no pain over her sternum and with speaking with her daughter she reports that that she had a fall in January directly on her chest where she broke her breastbone.  This appears to be an old injury and is not causing any issues today.  Patient is stable for discharge home and will take Tylenol  as needed for pain         Final Clinical Impression(s) / ED Diagnoses Final diagnoses:  Fall, initial encounter  Bigeminy    Rx / DC Orders ED Discharge Orders     None         Almond Army, MD 09/05/23 1831

## 2023-09-05 NOTE — ED Triage Notes (Signed)
 Pt bib [facility] coming from home. Pt was standing and lost balance, slipped and fell. Pt reports pt did hit back where she hit it the other not. No LOC, dizziness. Did not hit head. Pt not on blood thinners. Pt alert and oriented x4. EMS reports irregular heart rhythm w/ PVCs. Pt jas dropped to upper 40s with EMS.   EMS vitals: 90s Bigeminy 142/80 115 CBG  97% RA 18-20 RR 20 Left wrist  500cc NS

## 2023-09-05 NOTE — Discharge Instructions (Signed)
 All your blood work looks normal today including your white and red blood cell counts, your kidneys and your electrolytes.  No signs of any heart trouble.  You do have extra beats but they are not causing any problems.  You do not need to do anything specifically about this. In the future if you were to develop chest pain, shortness of breath, dizziness or passing out you should return to the emergency room.  No signs of any injury on your x-ray today.

## 2023-09-05 NOTE — ED Notes (Signed)
 Called lab to check status of previously sent lab work. Lab states they do not have sent specimen at this time. MD notified of delay.

## 2023-09-09 DIAGNOSIS — I700 Atherosclerosis of aorta: Secondary | ICD-10-CM | POA: Diagnosis not present

## 2023-09-09 DIAGNOSIS — E559 Vitamin D deficiency, unspecified: Secondary | ICD-10-CM | POA: Diagnosis not present

## 2023-09-09 DIAGNOSIS — M810 Age-related osteoporosis without current pathological fracture: Secondary | ICD-10-CM | POA: Diagnosis not present

## 2023-09-09 DIAGNOSIS — R296 Repeated falls: Secondary | ICD-10-CM | POA: Diagnosis not present

## 2023-09-09 DIAGNOSIS — E785 Hyperlipidemia, unspecified: Secondary | ICD-10-CM | POA: Diagnosis not present

## 2023-09-09 DIAGNOSIS — Z0000 Encounter for general adult medical examination without abnormal findings: Secondary | ICD-10-CM | POA: Diagnosis not present

## 2023-09-09 DIAGNOSIS — R413 Other amnesia: Secondary | ICD-10-CM | POA: Diagnosis not present

## 2023-09-09 DIAGNOSIS — R2689 Other abnormalities of gait and mobility: Secondary | ICD-10-CM | POA: Diagnosis not present

## 2023-09-09 DIAGNOSIS — I10 Essential (primary) hypertension: Secondary | ICD-10-CM | POA: Diagnosis not present

## 2023-09-09 DIAGNOSIS — M65331 Trigger finger, right middle finger: Secondary | ICD-10-CM | POA: Diagnosis not present

## 2023-09-09 DIAGNOSIS — E039 Hypothyroidism, unspecified: Secondary | ICD-10-CM | POA: Diagnosis not present

## 2023-09-09 DIAGNOSIS — K219 Gastro-esophageal reflux disease without esophagitis: Secondary | ICD-10-CM | POA: Diagnosis not present

## 2023-09-13 DIAGNOSIS — M8008XD Age-related osteoporosis with current pathological fracture, vertebra(e), subsequent encounter for fracture with routine healing: Secondary | ICD-10-CM | POA: Diagnosis not present

## 2023-09-13 DIAGNOSIS — E785 Hyperlipidemia, unspecified: Secondary | ICD-10-CM | POA: Diagnosis not present

## 2023-09-13 DIAGNOSIS — E559 Vitamin D deficiency, unspecified: Secondary | ICD-10-CM | POA: Diagnosis not present

## 2023-09-13 DIAGNOSIS — M800AXD Age-related osteoporosis with current pathological fracture, other site, subsequent encounter for fracture with routine healing: Secondary | ICD-10-CM | POA: Diagnosis not present

## 2023-09-13 DIAGNOSIS — K219 Gastro-esophageal reflux disease without esophagitis: Secondary | ICD-10-CM | POA: Diagnosis not present

## 2023-09-13 DIAGNOSIS — E039 Hypothyroidism, unspecified: Secondary | ICD-10-CM | POA: Diagnosis not present

## 2023-09-13 DIAGNOSIS — R296 Repeated falls: Secondary | ICD-10-CM | POA: Diagnosis not present

## 2023-09-13 DIAGNOSIS — I10 Essential (primary) hypertension: Secondary | ICD-10-CM | POA: Diagnosis not present

## 2023-09-13 DIAGNOSIS — J449 Chronic obstructive pulmonary disease, unspecified: Secondary | ICD-10-CM | POA: Diagnosis not present

## 2023-09-13 DIAGNOSIS — Z9181 History of falling: Secondary | ICD-10-CM | POA: Diagnosis not present

## 2023-09-13 DIAGNOSIS — G500 Trigeminal neuralgia: Secondary | ICD-10-CM | POA: Diagnosis not present

## 2023-09-13 DIAGNOSIS — I700 Atherosclerosis of aorta: Secondary | ICD-10-CM | POA: Diagnosis not present

## 2023-09-13 DIAGNOSIS — D696 Thrombocytopenia, unspecified: Secondary | ICD-10-CM | POA: Diagnosis not present

## 2023-09-13 DIAGNOSIS — Z7982 Long term (current) use of aspirin: Secondary | ICD-10-CM | POA: Diagnosis not present

## 2023-09-13 DIAGNOSIS — Z853 Personal history of malignant neoplasm of breast: Secondary | ICD-10-CM | POA: Diagnosis not present

## 2023-09-13 DIAGNOSIS — F329 Major depressive disorder, single episode, unspecified: Secondary | ICD-10-CM | POA: Diagnosis not present

## 2023-09-13 DIAGNOSIS — M65331 Trigger finger, right middle finger: Secondary | ICD-10-CM | POA: Diagnosis not present

## 2023-09-16 DIAGNOSIS — D696 Thrombocytopenia, unspecified: Secondary | ICD-10-CM | POA: Diagnosis not present

## 2023-09-16 DIAGNOSIS — M8008XD Age-related osteoporosis with current pathological fracture, vertebra(e), subsequent encounter for fracture with routine healing: Secondary | ICD-10-CM | POA: Diagnosis not present

## 2023-09-16 DIAGNOSIS — I700 Atherosclerosis of aorta: Secondary | ICD-10-CM | POA: Diagnosis not present

## 2023-09-16 DIAGNOSIS — J449 Chronic obstructive pulmonary disease, unspecified: Secondary | ICD-10-CM | POA: Diagnosis not present

## 2023-09-16 DIAGNOSIS — I10 Essential (primary) hypertension: Secondary | ICD-10-CM | POA: Diagnosis not present

## 2023-09-16 DIAGNOSIS — M800AXD Age-related osteoporosis with current pathological fracture, other site, subsequent encounter for fracture with routine healing: Secondary | ICD-10-CM | POA: Diagnosis not present

## 2023-09-20 DIAGNOSIS — I700 Atherosclerosis of aorta: Secondary | ICD-10-CM | POA: Diagnosis not present

## 2023-09-20 DIAGNOSIS — M800AXD Age-related osteoporosis with current pathological fracture, other site, subsequent encounter for fracture with routine healing: Secondary | ICD-10-CM | POA: Diagnosis not present

## 2023-09-20 DIAGNOSIS — J449 Chronic obstructive pulmonary disease, unspecified: Secondary | ICD-10-CM | POA: Diagnosis not present

## 2023-09-20 DIAGNOSIS — I10 Essential (primary) hypertension: Secondary | ICD-10-CM | POA: Diagnosis not present

## 2023-09-20 DIAGNOSIS — D696 Thrombocytopenia, unspecified: Secondary | ICD-10-CM | POA: Diagnosis not present

## 2023-09-20 DIAGNOSIS — M8008XD Age-related osteoporosis with current pathological fracture, vertebra(e), subsequent encounter for fracture with routine healing: Secondary | ICD-10-CM | POA: Diagnosis not present

## 2023-09-22 DIAGNOSIS — D696 Thrombocytopenia, unspecified: Secondary | ICD-10-CM | POA: Diagnosis not present

## 2023-09-22 DIAGNOSIS — M8008XD Age-related osteoporosis with current pathological fracture, vertebra(e), subsequent encounter for fracture with routine healing: Secondary | ICD-10-CM | POA: Diagnosis not present

## 2023-09-22 DIAGNOSIS — J449 Chronic obstructive pulmonary disease, unspecified: Secondary | ICD-10-CM | POA: Diagnosis not present

## 2023-09-22 DIAGNOSIS — I10 Essential (primary) hypertension: Secondary | ICD-10-CM | POA: Diagnosis not present

## 2023-09-22 DIAGNOSIS — M800AXD Age-related osteoporosis with current pathological fracture, other site, subsequent encounter for fracture with routine healing: Secondary | ICD-10-CM | POA: Diagnosis not present

## 2023-09-22 DIAGNOSIS — I700 Atherosclerosis of aorta: Secondary | ICD-10-CM | POA: Diagnosis not present

## 2023-09-28 DIAGNOSIS — M8008XD Age-related osteoporosis with current pathological fracture, vertebra(e), subsequent encounter for fracture with routine healing: Secondary | ICD-10-CM | POA: Diagnosis not present

## 2023-09-28 DIAGNOSIS — D696 Thrombocytopenia, unspecified: Secondary | ICD-10-CM | POA: Diagnosis not present

## 2023-09-28 DIAGNOSIS — J449 Chronic obstructive pulmonary disease, unspecified: Secondary | ICD-10-CM | POA: Diagnosis not present

## 2023-09-28 DIAGNOSIS — I700 Atherosclerosis of aorta: Secondary | ICD-10-CM | POA: Diagnosis not present

## 2023-09-28 DIAGNOSIS — I10 Essential (primary) hypertension: Secondary | ICD-10-CM | POA: Diagnosis not present

## 2023-09-28 DIAGNOSIS — M800AXD Age-related osteoporosis with current pathological fracture, other site, subsequent encounter for fracture with routine healing: Secondary | ICD-10-CM | POA: Diagnosis not present

## 2023-09-30 DIAGNOSIS — D696 Thrombocytopenia, unspecified: Secondary | ICD-10-CM | POA: Diagnosis not present

## 2023-09-30 DIAGNOSIS — I700 Atherosclerosis of aorta: Secondary | ICD-10-CM | POA: Diagnosis not present

## 2023-09-30 DIAGNOSIS — M8008XD Age-related osteoporosis with current pathological fracture, vertebra(e), subsequent encounter for fracture with routine healing: Secondary | ICD-10-CM | POA: Diagnosis not present

## 2023-09-30 DIAGNOSIS — I10 Essential (primary) hypertension: Secondary | ICD-10-CM | POA: Diagnosis not present

## 2023-09-30 DIAGNOSIS — M800AXD Age-related osteoporosis with current pathological fracture, other site, subsequent encounter for fracture with routine healing: Secondary | ICD-10-CM | POA: Diagnosis not present

## 2023-09-30 DIAGNOSIS — J449 Chronic obstructive pulmonary disease, unspecified: Secondary | ICD-10-CM | POA: Diagnosis not present

## 2023-10-04 DIAGNOSIS — M65331 Trigger finger, right middle finger: Secondary | ICD-10-CM | POA: Diagnosis not present

## 2023-10-06 DIAGNOSIS — D696 Thrombocytopenia, unspecified: Secondary | ICD-10-CM | POA: Diagnosis not present

## 2023-10-06 DIAGNOSIS — I700 Atherosclerosis of aorta: Secondary | ICD-10-CM | POA: Diagnosis not present

## 2023-10-06 DIAGNOSIS — M800AXD Age-related osteoporosis with current pathological fracture, other site, subsequent encounter for fracture with routine healing: Secondary | ICD-10-CM | POA: Diagnosis not present

## 2023-10-06 DIAGNOSIS — M8008XD Age-related osteoporosis with current pathological fracture, vertebra(e), subsequent encounter for fracture with routine healing: Secondary | ICD-10-CM | POA: Diagnosis not present

## 2023-10-06 DIAGNOSIS — I10 Essential (primary) hypertension: Secondary | ICD-10-CM | POA: Diagnosis not present

## 2023-10-06 DIAGNOSIS — J449 Chronic obstructive pulmonary disease, unspecified: Secondary | ICD-10-CM | POA: Diagnosis not present

## 2023-10-11 DIAGNOSIS — M24541 Contracture, right hand: Secondary | ICD-10-CM | POA: Diagnosis not present

## 2023-10-12 ENCOUNTER — Inpatient Hospital Stay (HOSPITAL_BASED_OUTPATIENT_CLINIC_OR_DEPARTMENT_OTHER): Payer: Medicare Other | Admitting: Hematology and Oncology

## 2023-10-12 ENCOUNTER — Encounter: Payer: Self-pay | Admitting: Hematology and Oncology

## 2023-10-12 ENCOUNTER — Inpatient Hospital Stay: Payer: Medicare Other | Attending: Hematology and Oncology

## 2023-10-12 VITALS — BP 111/50 | HR 51 | Temp 97.7°F | Resp 18 | Ht 62.0 in | Wt 144.4 lb

## 2023-10-12 DIAGNOSIS — D693 Immune thrombocytopenic purpura: Secondary | ICD-10-CM | POA: Diagnosis not present

## 2023-10-12 DIAGNOSIS — Z79899 Other long term (current) drug therapy: Secondary | ICD-10-CM | POA: Diagnosis not present

## 2023-10-12 DIAGNOSIS — Z853 Personal history of malignant neoplasm of breast: Secondary | ICD-10-CM | POA: Diagnosis not present

## 2023-10-12 DIAGNOSIS — Z8551 Personal history of malignant neoplasm of bladder: Secondary | ICD-10-CM | POA: Diagnosis not present

## 2023-10-12 LAB — CBC WITH DIFFERENTIAL/PLATELET
Abs Immature Granulocytes: 0.02 10*3/uL (ref 0.00–0.07)
Basophils Absolute: 0.1 10*3/uL (ref 0.0–0.1)
Basophils Relative: 1 %
Eosinophils Absolute: 0.1 10*3/uL (ref 0.0–0.5)
Eosinophils Relative: 2 %
HCT: 39 % (ref 36.0–46.0)
Hemoglobin: 13.4 g/dL (ref 12.0–15.0)
Immature Granulocytes: 0 %
Lymphocytes Relative: 23 %
Lymphs Abs: 1.8 10*3/uL (ref 0.7–4.0)
MCH: 30.6 pg (ref 26.0–34.0)
MCHC: 34.4 g/dL (ref 30.0–36.0)
MCV: 89 fL (ref 80.0–100.0)
Monocytes Absolute: 0.7 10*3/uL (ref 0.1–1.0)
Monocytes Relative: 8 %
Neutro Abs: 5.3 10*3/uL (ref 1.7–7.7)
Neutrophils Relative %: 66 %
Platelets: 114 10*3/uL — ABNORMAL LOW (ref 150–400)
RBC: 4.38 MIL/uL (ref 3.87–5.11)
RDW: 13.2 % (ref 11.5–15.5)
WBC: 8 10*3/uL (ref 4.0–10.5)
nRBC: 0 % (ref 0.0–0.2)

## 2023-10-12 NOTE — Progress Notes (Signed)
 Darlene [name] OFFICE PROGRESS NOTE  Patient Care Team: Jhon Moselle, FNP as PCP - General (Internal Medicine)  Assessment & Plan Chronic ITP (idiopathic thrombocytopenia) (HCC) Darlene [name] has stable platelet count Previously Darlene [name] has declined resumption of prednisone . Darlene [name] does not need it now I plan to see her back in 12 months for next follow-up  No orders of the defined types were placed in this encounter.    Darlene Jacobs, MD  INTERVAL HISTORY: Darlene [name] returns for surveillance follow-up for recurrent ITP, with also remote history of breast cancer, bladder cancer and lymphoma Patient denies recent bleeding such as epistaxis, hematuria or hematochezia We reviewed medication list and discussed medication changes We discussed test results and future plan of care as outlined above  PHYSICAL EXAMINATION: ECOG PERFORMANCE STATUS: 0 - Asymptomatic  Vitals:   10/12/23 1124  BP: (!) 111/50  Pulse: (!) 51  Resp: 18  Temp: 97.7 F (36.5 C)  SpO2: 96%   Lab Results  Component Value Date   WBC 8.0 10/12/2023   HGB 13.4 10/12/2023   HCT 39.0 10/12/2023   MCV 89.0 10/12/2023   PLT 114 (L) 10/12/2023   SUMMARY OF HEMATOLOGIC HISTORY:  Darlene [name] was transferred to my care after her prior physician has left.  I reviewed the patient's records extensive and collaborated the history with the patient. 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 Darlene [name] never required any systemic treatment. 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 This has also resolved on its own. Darlene [name] has never developed any additional adenopathy other than the single lymph node palpable in the left supraclavicular fossa.  Darlene [name] did develop a second primary stage I,  0.8 cm,  ER negative cancer  of the right breast, treated with lumpectomy, radiation and six cycles of CMF chemotherapy in 1994. No evidence of a new disease from the breast cancer either. Most recent mammogram done in June 2015 showed stable fibroglandular changes in her breasts. Darlene [name] was called back for an ultrasound of the left breast which also came back benign. Darlene [name] had a CT scan of the abdomen done on 11/14/15 because of significant intentional weight loss and abdominal discomfort.  Imaging studies show no evidence of lymphoma.  Moderate stool burden was noted. Repeat CT scan in March 2019 show stable lymphadenopathy with no signs or symptoms of cancer recurrence On 01/26/2018, Darlene [name] underwent transurethral bladder resection of the tumor by urologist Between 2019-2023, Darlene [name] had recurrent ITP relapse On November 11, 2021, Darlene [name] has made informed decision to discontinue prednisone 

## 2023-10-12 NOTE — Assessment & Plan Note (Addendum)
She has stable platelet count Previously she has declined resumption of prednisone. She does not need it now I plan to see her back in 12 months for next follow-up

## 2023-10-13 DIAGNOSIS — M24541 Contracture, right hand: Secondary | ICD-10-CM | POA: Diagnosis not present

## 2023-10-13 DIAGNOSIS — Z9181 History of falling: Secondary | ICD-10-CM | POA: Diagnosis not present

## 2023-10-13 DIAGNOSIS — D696 Thrombocytopenia, unspecified: Secondary | ICD-10-CM | POA: Diagnosis not present

## 2023-10-13 DIAGNOSIS — I700 Atherosclerosis of aorta: Secondary | ICD-10-CM | POA: Diagnosis not present

## 2023-10-13 DIAGNOSIS — F329 Major depressive disorder, single episode, unspecified: Secondary | ICD-10-CM | POA: Diagnosis not present

## 2023-10-13 DIAGNOSIS — E559 Vitamin D deficiency, unspecified: Secondary | ICD-10-CM | POA: Diagnosis not present

## 2023-10-13 DIAGNOSIS — K219 Gastro-esophageal reflux disease without esophagitis: Secondary | ICD-10-CM | POA: Diagnosis not present

## 2023-10-13 DIAGNOSIS — E039 Hypothyroidism, unspecified: Secondary | ICD-10-CM | POA: Diagnosis not present

## 2023-10-13 DIAGNOSIS — I10 Essential (primary) hypertension: Secondary | ICD-10-CM | POA: Diagnosis not present

## 2023-10-13 DIAGNOSIS — J449 Chronic obstructive pulmonary disease, unspecified: Secondary | ICD-10-CM | POA: Diagnosis not present

## 2023-10-13 DIAGNOSIS — R296 Repeated falls: Secondary | ICD-10-CM | POA: Diagnosis not present

## 2023-10-13 DIAGNOSIS — G500 Trigeminal neuralgia: Secondary | ICD-10-CM | POA: Diagnosis not present

## 2023-10-13 DIAGNOSIS — M65331 Trigger finger, right middle finger: Secondary | ICD-10-CM | POA: Diagnosis not present

## 2023-10-13 DIAGNOSIS — E785 Hyperlipidemia, unspecified: Secondary | ICD-10-CM | POA: Diagnosis not present

## 2023-10-13 DIAGNOSIS — M800AXD Age-related osteoporosis with current pathological fracture, other site, subsequent encounter for fracture with routine healing: Secondary | ICD-10-CM | POA: Diagnosis not present

## 2023-10-13 DIAGNOSIS — Z853 Personal history of malignant neoplasm of breast: Secondary | ICD-10-CM | POA: Diagnosis not present

## 2023-10-13 DIAGNOSIS — Z7982 Long term (current) use of aspirin: Secondary | ICD-10-CM | POA: Diagnosis not present

## 2023-10-13 DIAGNOSIS — M8008XD Age-related osteoporosis with current pathological fracture, vertebra(e), subsequent encounter for fracture with routine healing: Secondary | ICD-10-CM | POA: Diagnosis not present

## 2023-10-14 DIAGNOSIS — I10 Essential (primary) hypertension: Secondary | ICD-10-CM | POA: Diagnosis not present

## 2023-10-14 DIAGNOSIS — I700 Atherosclerosis of aorta: Secondary | ICD-10-CM | POA: Diagnosis not present

## 2023-10-14 DIAGNOSIS — M8008XD Age-related osteoporosis with current pathological fracture, vertebra(e), subsequent encounter for fracture with routine healing: Secondary | ICD-10-CM | POA: Diagnosis not present

## 2023-10-14 DIAGNOSIS — J449 Chronic obstructive pulmonary disease, unspecified: Secondary | ICD-10-CM | POA: Diagnosis not present

## 2023-10-14 DIAGNOSIS — D696 Thrombocytopenia, unspecified: Secondary | ICD-10-CM | POA: Diagnosis not present

## 2023-10-14 DIAGNOSIS — M800AXD Age-related osteoporosis with current pathological fracture, other site, subsequent encounter for fracture with routine healing: Secondary | ICD-10-CM | POA: Diagnosis not present

## 2023-10-15 ENCOUNTER — Other Ambulatory Visit: Payer: Self-pay | Admitting: Orthopedic Surgery

## 2023-10-18 DIAGNOSIS — J449 Chronic obstructive pulmonary disease, unspecified: Secondary | ICD-10-CM | POA: Diagnosis not present

## 2023-10-18 DIAGNOSIS — I700 Atherosclerosis of aorta: Secondary | ICD-10-CM | POA: Diagnosis not present

## 2023-10-18 DIAGNOSIS — I10 Essential (primary) hypertension: Secondary | ICD-10-CM | POA: Diagnosis not present

## 2023-10-18 DIAGNOSIS — M8008XD Age-related osteoporosis with current pathological fracture, vertebra(e), subsequent encounter for fracture with routine healing: Secondary | ICD-10-CM | POA: Diagnosis not present

## 2023-10-18 DIAGNOSIS — D696 Thrombocytopenia, unspecified: Secondary | ICD-10-CM | POA: Diagnosis not present

## 2023-10-18 DIAGNOSIS — M800AXD Age-related osteoporosis with current pathological fracture, other site, subsequent encounter for fracture with routine healing: Secondary | ICD-10-CM | POA: Diagnosis not present

## 2023-10-26 ENCOUNTER — Other Ambulatory Visit: Payer: Self-pay

## 2023-10-26 ENCOUNTER — Encounter (HOSPITAL_BASED_OUTPATIENT_CLINIC_OR_DEPARTMENT_OTHER): Payer: Self-pay | Admitting: Orthopedic Surgery

## 2023-10-27 DIAGNOSIS — I700 Atherosclerosis of aorta: Secondary | ICD-10-CM | POA: Diagnosis not present

## 2023-10-27 DIAGNOSIS — J449 Chronic obstructive pulmonary disease, unspecified: Secondary | ICD-10-CM | POA: Diagnosis not present

## 2023-10-27 DIAGNOSIS — D696 Thrombocytopenia, unspecified: Secondary | ICD-10-CM | POA: Diagnosis not present

## 2023-10-27 DIAGNOSIS — M800AXD Age-related osteoporosis with current pathological fracture, other site, subsequent encounter for fracture with routine healing: Secondary | ICD-10-CM | POA: Diagnosis not present

## 2023-10-27 DIAGNOSIS — M8008XD Age-related osteoporosis with current pathological fracture, vertebra(e), subsequent encounter for fracture with routine healing: Secondary | ICD-10-CM | POA: Diagnosis not present

## 2023-10-27 DIAGNOSIS — I10 Essential (primary) hypertension: Secondary | ICD-10-CM | POA: Diagnosis not present

## 2023-11-02 ENCOUNTER — Ambulatory Visit (HOSPITAL_BASED_OUTPATIENT_CLINIC_OR_DEPARTMENT_OTHER): Admitting: Anesthesiology

## 2023-11-02 ENCOUNTER — Other Ambulatory Visit: Payer: Self-pay

## 2023-11-02 ENCOUNTER — Encounter (HOSPITAL_BASED_OUTPATIENT_CLINIC_OR_DEPARTMENT_OTHER)
Admission: RE | Disposition: A | Discharge status: Home / Self Care | Payer: Self-pay | Source: Home / Self Care | Attending: Orthopedic Surgery

## 2023-11-02 ENCOUNTER — Encounter (HOSPITAL_BASED_OUTPATIENT_CLINIC_OR_DEPARTMENT_OTHER): Payer: Self-pay | Admitting: Orthopedic Surgery

## 2023-11-02 ENCOUNTER — Ambulatory Visit (HOSPITAL_BASED_OUTPATIENT_CLINIC_OR_DEPARTMENT_OTHER)
Admission: RE | Admit: 2023-11-02 | Discharge: 2023-11-02 | Disposition: A | Discharge status: Home / Self Care | Attending: Orthopedic Surgery | Admitting: Orthopedic Surgery

## 2023-11-02 DIAGNOSIS — F32A Depression, unspecified: Secondary | ICD-10-CM | POA: Diagnosis not present

## 2023-11-02 DIAGNOSIS — F0393 Unspecified dementia, unspecified severity, with mood disturbance: Secondary | ICD-10-CM | POA: Insufficient documentation

## 2023-11-02 DIAGNOSIS — Z79890 Hormone replacement therapy (postmenopausal): Secondary | ICD-10-CM | POA: Insufficient documentation

## 2023-11-02 DIAGNOSIS — E039 Hypothyroidism, unspecified: Secondary | ICD-10-CM | POA: Diagnosis not present

## 2023-11-02 DIAGNOSIS — Z79899 Other long term (current) drug therapy: Secondary | ICD-10-CM | POA: Insufficient documentation

## 2023-11-02 DIAGNOSIS — D696 Thrombocytopenia, unspecified: Secondary | ICD-10-CM | POA: Diagnosis not present

## 2023-11-02 DIAGNOSIS — M65331 Trigger finger, right middle finger: Secondary | ICD-10-CM

## 2023-11-02 DIAGNOSIS — F419 Anxiety disorder, unspecified: Secondary | ICD-10-CM | POA: Diagnosis not present

## 2023-11-02 DIAGNOSIS — I700 Atherosclerosis of aorta: Secondary | ICD-10-CM | POA: Diagnosis not present

## 2023-11-02 DIAGNOSIS — I10 Essential (primary) hypertension: Secondary | ICD-10-CM | POA: Insufficient documentation

## 2023-11-02 DIAGNOSIS — K219 Gastro-esophageal reflux disease without esophagitis: Secondary | ICD-10-CM | POA: Diagnosis not present

## 2023-11-02 DIAGNOSIS — M65341 Trigger finger, right ring finger: Secondary | ICD-10-CM

## 2023-11-02 DIAGNOSIS — Z87891 Personal history of nicotine dependence: Secondary | ICD-10-CM | POA: Insufficient documentation

## 2023-11-02 DIAGNOSIS — M8008XD Age-related osteoporosis with current pathological fracture, vertebra(e), subsequent encounter for fracture with routine healing: Secondary | ICD-10-CM | POA: Diagnosis not present

## 2023-11-02 DIAGNOSIS — J449 Chronic obstructive pulmonary disease, unspecified: Secondary | ICD-10-CM | POA: Insufficient documentation

## 2023-11-02 DIAGNOSIS — M800AXD Age-related osteoporosis with current pathological fracture, other site, subsequent encounter for fracture with routine healing: Secondary | ICD-10-CM | POA: Diagnosis not present

## 2023-11-02 SURGERY — RELEASE, A1 PULLEY, FOR TRIGGER FINGER
Anesthesia: Monitor Anesthesia Care | Site: Finger | Laterality: Right

## 2023-11-02 MED ORDER — TRAMADOL HCL 50 MG PO TABS: 50.0000 mg | ORAL_TABLET | Freq: Four times a day (QID) | ORAL | 0 refills | Status: DC | PRN

## 2023-11-02 MED ORDER — ACETAMINOPHEN 500 MG PO TABS
ORAL_TABLET | ORAL | Status: AC
  Filled 2023-11-02: qty 2

## 2023-11-02 MED ORDER — FENTANYL CITRATE (PF) 100 MCG/2ML IJ SOLN: 25.0000 ug | INTRAMUSCULAR | Status: DC | PRN

## 2023-11-02 MED ORDER — LACTATED RINGERS IV SOLN: INTRAVENOUS | Status: DC

## 2023-11-02 MED ORDER — PROPOFOL 500 MG/50ML IV EMUL
INTRAVENOUS | Status: AC
  Filled 2023-11-02: qty 50

## 2023-11-02 MED ORDER — ONDANSETRON HCL 4 MG/2ML IJ SOLN
4.0000 mg | Freq: Once | INTRAMUSCULAR | Status: DC | PRN
Start: 2023-11-02 — End: 2023-11-02

## 2023-11-02 MED ORDER — ACETAMINOPHEN 500 MG PO TABS
1000.0000 mg | ORAL_TABLET | Freq: Once | ORAL | Status: AC
  Administered 2023-11-02: 1000 mg via ORAL

## 2023-11-02 MED ORDER — ONDANSETRON HCL 4 MG/2ML IJ SOLN
INTRAMUSCULAR | Status: DC | PRN
  Administered 2023-11-02: 4 mg via INTRAVENOUS

## 2023-11-02 MED ORDER — PROPOFOL 10 MG/ML IV BOLUS
INTRAVENOUS | Status: DC | PRN
  Administered 2023-11-02 (×2): 20 mg via INTRAVENOUS

## 2023-11-02 MED ORDER — PROPOFOL 500 MG/50ML IV EMUL
INTRAVENOUS | Status: DC | PRN
  Administered 2023-11-02: 50 ug/kg/min via INTRAVENOUS
  Administered 2023-11-02: 75 ug/kg/min via INTRAVENOUS

## 2023-11-02 MED ORDER — CEFAZOLIN SODIUM-DEXTROSE 2-4 GM/100ML-% IV SOLN
INTRAVENOUS | Status: AC
  Filled 2023-11-02: qty 100

## 2023-11-02 MED ORDER — BUPIVACAINE HCL (PF) 0.25 % IJ SOLN
INTRAMUSCULAR | Status: DC | PRN
Start: 2023-11-02 — End: 2023-11-02
  Administered 2023-11-02: 9 mL

## 2023-11-02 MED ORDER — CEFAZOLIN SODIUM-DEXTROSE 2-4 GM/100ML-% IV SOLN
2.0000 g | INTRAVENOUS | Status: AC
  Administered 2023-11-02: 2 g via INTRAVENOUS

## 2023-11-02 MED ORDER — AMISULPRIDE (ANTIEMETIC) 5 MG/2ML IV SOLN: 10.0000 mg | Freq: Once | INTRAVENOUS | Status: DC | PRN

## 2023-11-02 MED ORDER — LIDOCAINE HCL (PF) 0.5 % IJ SOLN
INTRAMUSCULAR | Status: DC | PRN
  Administered 2023-11-02: 30 mL via INTRAVENOUS

## 2023-11-02 SURGICAL SUPPLY — 26 items
BLADE SURG 15 STRL LF DISP TIS (BLADE) ×2 IMPLANT
BNDG COHESIVE 2X5 TAN ST LF (GAUZE/BANDAGES/DRESSINGS) ×1 IMPLANT
BNDG ESMARK 4X9 LF (GAUZE/BANDAGES/DRESSINGS) IMPLANT
CHLORAPREP W/TINT 26 (MISCELLANEOUS) ×1 IMPLANT
CORD BIPOLAR FORCEPS 12FT (ELECTRODE) ×1 IMPLANT
COVER BACK TABLE 60X90IN (DRAPES) ×1 IMPLANT
COVER MAYO STAND STRL (DRAPES) ×1 IMPLANT
CUFF TOURN SGL QUICK 18X4 (TOURNIQUET CUFF) ×1 IMPLANT
DRAPE EXTREMITY T 121X128X90 (DISPOSABLE) ×1 IMPLANT
DRAPE SURG 17X23 STRL (DRAPES) ×1 IMPLANT
GAUZE SPONGE 4X4 12PLY STRL (GAUZE/BANDAGES/DRESSINGS) ×1 IMPLANT
GAUZE XEROFORM 1X8 LF (GAUZE/BANDAGES/DRESSINGS) ×1 IMPLANT
GLOVE BIO SURGEON STRL SZ7.5 (GLOVE) ×1 IMPLANT
GLOVE BIOGEL PI IND STRL 8 (GLOVE) ×1 IMPLANT
GOWN STRL REUS W/ TWL LRG LVL3 (GOWN DISPOSABLE) ×1 IMPLANT
GOWN STRL REUS W/TWL XL LVL3 (GOWN DISPOSABLE) ×1 IMPLANT
NDL HYPO 25X1 1.5 SAFETY (NEEDLE) ×1 IMPLANT
NEEDLE HYPO 25X1 1.5 SAFETY (NEEDLE) ×1 IMPLANT
NS IRRIG 1000ML POUR BTL (IV SOLUTION) ×1 IMPLANT
PACK BASIN DAY SURGERY FS (CUSTOM PROCEDURE TRAY) ×1 IMPLANT
STOCKINETTE 4X48 STRL (DRAPES) ×1 IMPLANT
SUT ETHILON 4 0 PS 2 18 (SUTURE) ×1 IMPLANT
SYR BULB EAR ULCER 3OZ GRN STR (SYRINGE) ×1 IMPLANT
SYR CONTROL 10ML LL (SYRINGE) ×1 IMPLANT
TOWEL GREEN STERILE FF (TOWEL DISPOSABLE) ×2 IMPLANT
UNDERPAD 30X36 HEAVY ABSORB (UNDERPADS AND DIAPERS) ×1 IMPLANT

## 2023-11-02 NOTE — Op Note (Signed)
 I assisted Surgeons and Role:    * Murrell Drivers, MD - Primary    * Murrell Kuba, MD - Assisting on the Procedure(s): RELEASE, A1 PULLEY, FOR TRIGGER FINGER on 11/02/2023.  I provided assistance on this case as follows: Set up, approach, release of the A1 pulley of each finger, manipulation of the MP PIP DIP joints of each finger, closure of the wounds and application of a dressing.  Electronically signed by: Kuba Murrell, MD Date: 11/02/2023 Time: 1:50 PM

## 2023-11-02 NOTE — Anesthesia Postprocedure Evaluation (Signed)
 Anesthesia Post Note  Patient: Darlene [name]  Procedure(s) Performed: RELEASE, A1 PULLEY, FOR TRIGGER FINGER (Right: Finger)     Patient location during evaluation: PACU Anesthesia Type: Bier Block Level of consciousness: awake Pain management: pain level controlled Vital Signs Assessment: post-procedure vital signs reviewed and stable Respiratory status: spontaneous breathing, nonlabored ventilation and respiratory function stable Cardiovascular status: blood pressure returned to baseline and stable Postop Assessment: no apparent nausea or vomiting Anesthetic complications: no   No notable events documented.  Last Vitals:  Vitals:   11/02/23 1400 11/02/23 1425  BP: (!) 145/61 (!) 158/61  Pulse: 64 62  Resp: 14 16  Temp:  (!) 36.3 C  SpO2: 96% 94%    Last Pain:  Vitals:   11/02/23 1425  TempSrc: Temporal  PainSc: 0-No pain                 Takuya Lariccia P Artia Singley

## 2023-11-02 NOTE — Anesthesia Procedure Notes (Addendum)
 Anesthesia Regional Block: Bier block (IV Regional)   Pre-Anesthetic Checklist: , timeout performed,  Correct Patient, Correct Site, Correct Laterality,  Correct Procedure,, site marked,  Surgical consent,  At surgeon's request  Laterality: Right and Lower         Needles:  Injection technique: Single-shot  Needle Type: Other      Needle Gauge: 22     Additional Needles:   Procedures:,,,,, intact distal pulses, Esmarch exsanguination,  Single tourniquet utilized    Narrative:  Start time: 11/02/2023 1:14 PM End time: 11/02/2023 1:14 PM Injection made incrementally with aspirations every 30 mL.  Performed by: Personally

## 2023-11-02 NOTE — Discharge Instructions (Addendum)
 [facility] Instructions Hand Surgery  Wound Care: Keep your hand elevated above the level of your heart.  Do not allow it to dangle by your side.  Keep the dressing dry and do not remove it unless your doctor advises you to do so.  He will usually change it at the time of your post-op visit.  Moving your fingers is advised to stimulate circulation but will depend on the site of your surgery.  If you have a splint applied, your doctor will advise you regarding movement.  Activity: Do not drive or operate machinery today.  Rest today and then you may return to your normal activity and work as indicated by your physician.  Diet:  Drink liquids today or eat a light diet.  You may resume a regular diet tomorrow.    General expectations: Pain for two to three days. Fingers may become slightly swollen.  Call your doctor if any of the following occur: Severe pain not relieved by pain medication. Elevated temperature. Dressing soaked with blood. Inability to move fingers. White or bluish color to fingers.    Post Anesthesia Home Care Instructions  Activity: Get plenty of rest for the remainder of the day. A responsible individual must stay with you for 24 hours following the procedure.  For the next 24 hours, DO NOT: -Drive a car -Advertising copywriter -Drink alcoholic beverages -Take any medication unless instructed by your physician -Make any legal decisions or sign important papers.  Meals: Start with liquid foods such as gelatin or soup. Progress to regular foods as tolerated. Avoid greasy, spicy, heavy foods. If nausea and/or vomiting occur, drink only clear liquids until the nausea and/or vomiting subsides. Call your physician if vomiting continues.  Special Instructions/Symptoms: Your throat may feel dry or sore from the anesthesia or the breathing tube placed in your throat during surgery. If this causes discomfort, gargle with warm salt water . The discomfort should disappear  within 24 hours.  May have Tylenol  after 6:15pm if needed.

## 2023-11-02 NOTE — Op Note (Signed)
 11/02/2023 [facility]  Operative Note  PREOPERATIVE DIAGNOSIS: RIGHT LONG FINGER AND RIGHT RING FINGER LOCKED TRIGGER DIGITS  POSTOPERATIVE DIAGNOSIS:  RIGHT LONG FINGER AND RIGHT RING FINGER LOCKED TRIGGER DIGITS  PROCEDURE: Procedure(s): 1.  Right long finger trigger release 2.  Right long finger manipulation of MP joint under anesthesia 3.  Right long finger manipulation PIP joint under anesthesia 4.  Right ring finger trigger release 5.  Right ring finger manipulation MP joint under anesthesia 6.  Right ring finger manipulation PIP joint under anesthesia  SURGEON:  Darlene Lanzo, MD  ASSISTANT: Arley Curia, MD.  ANESTHESIA:  Bier block with sedation.  IV FLUIDS:  Per anesthesia flow sheet.  ESTIMATED BLOOD LOSS:  Minimal.  COMPLICATIONS:  None.  SPECIMENS:  None.  TOURNIQUET TIME:  Total Tourniquet Time Documented: Forearm (Right) - 29 minutes Total: Forearm (Right) - 29 minutes   DISPOSITION:  Stable to PACU.  LOCATION: [facility]  INDICATIONS: Darlene [name] is a 89 y.o. female with locked trigger digits of right long and ring finger with contracture of the MP and PIP joints.    She wishes to proceed with surgical trigger release and manipulation of the MP and PIP joints to try to bring the fingers out of the palm.  Risks, benefits and alternatives of surgery were discussed including the risk of blood loss, infection, damage to nerves, vessels, tendons, ligaments, bone, failure of surgery, need for additional surgery, complications with wound healing, continued pain, continued triggering and need for repeat surgery.  She voiced understanding of these risks and elected to proceed.  OPERATIVE COURSE:  After being identified preoperatively by myself, the patient and I agreed upon the procedure and site of procedure.  The surgical site was marked. Surgical consent had been signed. She was given IV Ancef  as preoperative antibiotic prophylaxis.  She was transported to the operating room and placed on the operating room table in supine position with the Right upper extremity on an arm board. Bier block anesthesia was induced by the anesthesiologist.  The Right upper extremity was prepped and draped in normal sterile orthopedic fashion. A surgical pause was performed between surgeons, anesthesia, and operating room staff, and all were in agreement as to the patient, procedure, and site of procedure.  Tourniquet at the proximal aspect of the forearm had been inflated for the Bier block.  The ring finger MP joint was manipulated to try to bring it better extension to access the surgical site.  An incision was made at the volar aspect of the MP joint of the ring finger.  This was carried into the subcutaneous tissues by spreading technique.  Bipolar electrocautery was used to obtain hemostasis.  The radial and ulnar digital nerves were protected throughout the case. The flexor sheath was identified.  The A1 pulley was identified and sharply incised.  It was released in its entirety.  The proximal 1-2 mm of the A2 pulley was vented to allow better excursion of the tendons.  The MP joint was manipulated to bring it into better extension.  This was done slowly.  The PIP joint was also manipulated to bring into better extension.  Near full extension of MP and PIP joints was achieved.  There was a small tear of the skin at the volar aspect of the PIP joint at the flexor crease.  The finger was still tight but came out of the palm much better than preoperatively.  There was a catch at the  PIP joint from extension into flexion.  The A2 sheath was released slightly more and this improved but not totally resolved the catching sensation.  Incision was made at the volar aspect of the MP joint of the long finger.  This was carried in subcutaneous tissues by spreading technique.  The A1 pulley was identified and sharply incised in its entirety.  The proximal 1 to 2 mm of the  A2 pulley was released as well.  The MP and PIP joints were tight.  Manipulation of the MP joint was performed slowly to bring it back into better extension.  Manipulation of the PIP joint was also performed to bring into better extension.  The finger was able to be brought into full extension.  The FDS and flexor tendon was brought through the wound and released from the FDP tendon.  The finger was placed through range of motion and no triggering was noted.  The wounds were copiously irrigated with sterile saline.  They were closed with 4-0 nylon in a horizontal mattress fashion.  They were injected with 0.25% plain Marcaine  to aid in postoperative analgesia.  They were dressed with sterile Xeroform, 4x4s, and wrapped lightly with a Coban dressing.  Tourniquet was deflated at 29 minutes.  The fingertips were pink with brisk capillary refill after deflation of the tourniquet.  The operative drapes were broken down and the patient was awoken from anesthesia safely.  She was transferred back to the stretcher and taken to the PACU in stable condition.   I will see her back in the office in 1 week for postoperative followup.  I will give her a prescription for Tramadol  50 mg 1 tab PO q6 hours prn pain, dispense #15.    Ioan Landini, MD Electronically signed, 11/02/23

## 2023-11-02 NOTE — H&P (Signed)
 Darlene [name] is an 89 y.o. female.   Chief Complaint: trigger digits HPI: 89 yo female with locked trigger digits right long and ring fingers.  This started approximately a year ago.  She wishes to proceed with surgical release of the trigger digits and manipulation of the digits.  Allergies:  Allergies  Allergen Reactions   Oxycodone  Other (See Comments)    Made her depressed   Oxycodone  Hcl     Other reaction(s): depressed feeling   Sulfamethoxazole-Trimethoprim     Other reaction(s): Unknown   Codeine Itching            Doxycycline Swelling and Other (See Comments)    Burns throat also   Meperidine And Related Itching and Rash    Makes cheeks red also   Sulfa Antibiotics Itching   Sulfacetamide Sodium Itching    Past Medical History:  Diagnosis Date   Anemia    Anxiety    Aortic atherosclerosis (HCC)    Breast cancer, stage 1, estrogen receptor negative (HCC) 07/06/2011   Cancer of breast (HCC)    Cataract    Chronic ITP (idiopathic thrombocytopenia) (HCC) 07/06/2011   Constipation    takes Colace daily   COPD (chronic obstructive pulmonary disease) (HCC)    mild   Dementia (HCC)    Depression    nervous break down age 12   Fast heart beat    GERD (gastroesophageal reflux disease)    takes Nexium daily   History of colon polyps    History of depression    received shock treatments    History of palpitations    takes Metoprolol  daily   History of vertigo    HOH (hard of hearing)    has hearing aids   Hyperlipidemia    takes Lovastatin daily   Hypertension    Hypothyroid 07/06/2011   takes SYnthroid  daily   Joint pain    Joint swelling    Lymphoma (HCC)    Lymphoma (HCC)    B cell    Lymphoma of lymph nodes of head, face, and/or neck (HCC) 07/06/2011   Neuropathy    Nocturia    Osteopenia    Osteoporosis    hx of and take Reclast    Peripheral neuropathy    Personal history of chemotherapy    Personal history of radiation therapy    Right  knee DJD    Shortness of breath dyspnea    with exertion   Thyroid  disease    Umbilical hernia    Vitamin D  deficiency     Past Surgical History:  Procedure Laterality Date   ADENOIDECTOMY     as a child   BREAST LUMPECTOMY Right 1994   BREAST SURGERY Right    BUNIONECTOMY Right    CARDIAC CATHETERIZATION  2014   COLONOSCOPY     FOOT SURGERY Right    FRACTURE SURGERY     wrist right , right knee   INSERTION OF MESH N/A 04/17/2015   Procedure: INSERTION OF MESH;  Surgeon: Lynda Leos, MD;  Location: [facility] OR;  Service: General;  Laterality: N/A;   KNEE SURGERY     LEFT HEART CATHETERIZATION WITH CORONARY ANGIOGRAM N/A 12/05/2012   Procedure: LEFT HEART CATHETERIZATION WITH CORONARY ANGIOGRAM;  Surgeon: Debby DELENA Sor, MD;  Location: [facility] C[facility];  Service: Cardiovascular;  Laterality: N/A;   right  breast surgery     d/t breast cancer   TONSILLECTOMY     as a child   TOTAL  KNEE ARTHROPLASTY Right 06/19/2013   Procedure: TOTAL KNEE ARTHROPLASTY;  Surgeon: Lamar DELENA Millman, MD;  Location: [facility] OR;  Service: Orthopedics;  Laterality: Right;   TRANSURETHRAL RESECTION OF BLADDER TUMOR WITH GYRUS (TURBT-GYRUS)     01-26-18 Dr. Cam   TRANSURETHRAL RESECTION OF BLADDER TUMOR WITH MITOMYCIN -C Bilateral 01/26/2018   Procedure: TRANSURETHRAL RESECTION OF BLADDER TUMOR WITH GEMCITABIN BLLATERAL RETROGRADE PYELOGRAM;  Surgeon: Cam Morene ORN, MD;  Location: [facility] ORS;  Service: Urology;  Laterality: Bilateral;   TURBINATE REDUCTION     UMBILICAL HERNIA REPAIR N/A 04/17/2015   Procedure: LAPAROSCOPIC UMBILICAL HERNIA REPAIR WITH MESH;  Surgeon: Lynda Leos, MD;  Location: [facility] OR;  Service: General;  Laterality: N/A;   UPPER GASTROINTESTINAL ENDOSCOPY      Family History: Family History  Problem Relation Age of Onset   Other Mother        C Diff   Hyperlipidemia Father    Heart disease Father    Heart disease Sister    Cancer Brother        Agent orange   Heart disease Maternal  Grandfather    Diabetes Son    Mood Disorder Daughter    Hypertension Daughter    Colon cancer Neg Hx    Breast cancer Neg Hx     Social History:   reports that she quit smoking about 40 years ago. Her smoking use included cigarettes. She has never used smokeless tobacco. She reports that she does not drink alcohol  and does not use drugs.  Medications: Medications Prior to Admission  Medication Sig Dispense Refill   acetaminophen  (TYLENOL ) 500 MG tablet Take 1 tablet (500 mg total) by mouth every 6 (six) hours as needed for mild pain. 30 tablet 0   aspirin  81 MG tablet Take 81 mg by mouth daily.     cholecalciferol (VITAMIN D ) 1000 UNITS tablet Take 1,000 Units by mouth daily.     citalopram  (CELEXA ) 40 MG tablet Take 40 mg by mouth daily.      Coenzyme Q10 (CO Q 10) 100 MG CAPS See admin instructions.     Cyanocobalamin  (VITAMIN B-12 PO) Take 1,000 mcg by mouth daily.     ergocalciferol  (VITAMIN D2) 1.25 MG (50000 UT) capsule      Iron , Ferrous Sulfate , 325 (65 Fe) MG TABS Take 1 tablet by mouth every other day.     levothyroxine  (SYNTHROID , LEVOTHROID) 75 MCG tablet Take 75 mcg by mouth daily before breakfast.      lovastatin (MEVACOR) 20 MG tablet TAKE 1 TABLET BY MOUTH ONCEDAILY AT BEDTIME     magnesium  oxide (MAG-OX) 400 MG tablet Take 400 mg by mouth daily.     metoprolol  succinate (TOPROL -XL) 25 MG 24 hr tablet Take 25 mg by mouth daily.      Multiple Vitamins-Minerals (MULTIVITAMINS THER. W/MINERALS) TABS Take 1 tablet by mouth daily.     Polyethyl Glycol-Propyl Glycol 0.4-0.3 % SOLN Apply 2 drops to eye 2 (two) times daily as needed (for dry eyes).     pregabalin  (LYRICA ) 50 MG capsule Take 50 mg by mouth 2 (two) times daily.     Probiotic Product (PROBIOTIC PO) Take 1 tablet by mouth daily.     betamethasone  dipropionate 0.05 % cream      Clobetasol Propionate 0.05 % shampoo      diclofenac  Sodium (VOLTAREN ) 1 % GEL      esomeprazole (NEXIUM) 40 MG capsule Take 40 mg by  mouth 2 (two) times daily before a  meal.     famciclovir  (FAMVIR ) 125 MG tablet      lidocaine  (LIDODERM ) 5 % Place 1 patch onto the skin daily. Remove & Discard patch within 12 hours or as directed by MD 30 patch 0   lidocaine  (LIDODERM ) 5 % Place 1 patch onto the skin daily. Remove & Discard patch within 12 hours or as directed by MD 15 patch 0   methocarbamol  (ROBAXIN ) 500 MG tablet Take 1 tablet (500 mg total) by mouth every 8 (eight) hours as needed for muscle spasms. 20 tablet 0   mirabegron  ER (MYRBETRIQ ) 25 MG TB24 tablet Take 25 mg by mouth daily.     nystatin-triamcinolone  ointment (MYCOLOG) Apply 1 Application topically 2 (two) times daily.     ondansetron  (ZOFRAN -ODT) 4 MG disintegrating tablet Take 1 tablet (4 mg total) by mouth every 8 (eight) hours as needed for nausea or vomiting. 20 tablet 0   oxyCODONE  (ROXICODONE ) 5 MG immediate release tablet Take 0.5 tablets (2.5 mg total) by mouth every 4 (four) hours as needed. 10 tablet 0   traMADol  (ULTRAM ) 50 MG tablet       No results found for this or any previous visit (from the past 48 hours).  No results found.    Blood pressure 117/70, pulse 72, temperature 98 F (36.7 C), temperature source Tympanic, resp. rate 18, height 5' 2 (1.575 m), weight 64.9 kg, SpO2 97%.  General appearance: alert, cooperative, and appears stated age Head: Normocephalic, without obvious abnormality, atraumatic Neck: supple, symmetrical, trachea midline Extremities: Intact sensation and capillary refill all digits.  +epl/fpl/io.  No wounds.  Skin: Skin color, texture, turgor normal. No rashes or lesions Neurologic: Grossly normal Incision/Wound: none  Assessment/Plan Right long and ring finger locked trigger digits.  Plan release trigger digits and joint manipulation as necessary.  Non operative and operative treatment options have been discussed with the patient and patient wishes to proceed with operative treatment. Risks, benefits, and  alternatives of surgery have been discussed and the patient agrees with the plan of care.   Sahian Kerney 11/02/2023, 12:43 PM

## 2023-11-02 NOTE — Anesthesia Preprocedure Evaluation (Addendum)
 Anesthesia Evaluation  Patient identified by MRN, date of birth, ID band Patient awake    Reviewed: Allergy & Precautions, NPO status , Patient's Chart, lab work & pertinent test results  Airway Mallampati: II  TM Distance: >3 FB Neck ROM: Full    Dental no notable dental hx.    Pulmonary COPD, former smoker   Pulmonary exam normal        Cardiovascular hypertension, Pt. on home beta blockers Normal cardiovascular exam     Neuro/Psych  PSYCHIATRIC DISORDERS Anxiety Depression   Dementia  Neuromuscular disease    GI/Hepatic Neg liver ROS,GERD  Medicated and Controlled,,  Endo/Other  Hypothyroidism    Renal/GU negative Renal ROS     Musculoskeletal  (+) Arthritis ,  Ambulates with a walker   Abdominal  (+) + obese  Peds  Hematology negative hematology ROS (+)   Anesthesia Other Findings RIGHT LONG FINGER AND RIGHT RING FINGER LOCKED TRIGGER DIGITS  Reproductive/Obstetrics                             Anesthesia Physical Anesthesia Plan  ASA: 3  Anesthesia Plan: Bier Block and Bier Block-Lidocaine  Only   Post-op Pain Management:    Induction:   PONV Risk Score and Plan: 2 and Ondansetron , Dexamethasone , Propofol  infusion and Treatment may vary due to age or medical condition  Airway Management Planned: Simple Face Mask  Additional Equipment:   Intra-op Plan:   Post-operative Plan:   Informed Consent: I have reviewed the patients History and Physical, chart, labs and discussed the procedure including the risks, benefits and alternatives for the proposed anesthesia with the patient or authorized representative who has indicated his/her understanding and acceptance.     Dental advisory given  Plan Discussed with: CRNA  Anesthesia Plan Comments:         Anesthesia Quick Evaluation

## 2023-11-02 NOTE — Transfer of Care (Signed)
 Immediate Anesthesia Transfer of Care Note  Patient: Darlene [name]  Procedure(s) Performed: RELEASE, A1 PULLEY, FOR TRIGGER FINGER (Right: Finger)  Patient Location: PACU  Anesthesia Type:Bier block  Level of Consciousness: drowsy  Airway & Oxygen Therapy: Patient Spontanous Breathing and Patient connected to face mask oxygen  Post-op Assessment: Report given to RN and Post -op Vital signs reviewed and stable  Post vital signs: Reviewed and stable  Last Vitals:  Vitals Value Taken Time  BP 151/67 11/02/23 13:50  Temp    Pulse 58 11/02/23 13:51  Resp 20 11/02/23 13:51  SpO2 99 % 11/02/23 13:51  Vitals shown include unfiled device data.  Last Pain:  Vitals:   11/02/23 1204  TempSrc: Tympanic  PainSc: 0-No pain      Patients Stated Pain Goal: 6 (11/02/23 1204)  Complications: No notable events documented.

## 2023-11-03 ENCOUNTER — Encounter (HOSPITAL_BASED_OUTPATIENT_CLINIC_OR_DEPARTMENT_OTHER): Payer: Self-pay | Admitting: Orthopedic Surgery

## 2023-11-09 DIAGNOSIS — M65331 Trigger finger, right middle finger: Secondary | ICD-10-CM | POA: Diagnosis not present

## 2023-11-09 DIAGNOSIS — M24541 Contracture, right hand: Secondary | ICD-10-CM | POA: Diagnosis not present

## 2023-11-09 DIAGNOSIS — M65341 Trigger finger, right ring finger: Secondary | ICD-10-CM | POA: Diagnosis not present

## 2023-11-17 DIAGNOSIS — M65331 Trigger finger, right middle finger: Secondary | ICD-10-CM | POA: Diagnosis not present

## 2023-11-17 DIAGNOSIS — M24541 Contracture, right hand: Secondary | ICD-10-CM | POA: Diagnosis not present

## 2023-11-17 DIAGNOSIS — M65341 Trigger finger, right ring finger: Secondary | ICD-10-CM | POA: Diagnosis not present

## 2023-11-25 DIAGNOSIS — M79641 Pain in right hand: Secondary | ICD-10-CM | POA: Diagnosis not present

## 2023-11-25 DIAGNOSIS — M25641 Stiffness of right hand, not elsewhere classified: Secondary | ICD-10-CM | POA: Diagnosis not present

## 2023-12-10 ENCOUNTER — Encounter: Payer: Self-pay | Admitting: Podiatry

## 2023-12-10 ENCOUNTER — Ambulatory Visit (INDEPENDENT_AMBULATORY_CARE_PROVIDER_SITE_OTHER): Admitting: Podiatry

## 2023-12-10 DIAGNOSIS — D696 Thrombocytopenia, unspecified: Secondary | ICD-10-CM

## 2023-12-10 DIAGNOSIS — B351 Tinea unguium: Secondary | ICD-10-CM | POA: Diagnosis not present

## 2023-12-10 DIAGNOSIS — M79676 Pain in unspecified toe(s): Secondary | ICD-10-CM | POA: Diagnosis not present

## 2023-12-10 NOTE — Progress Notes (Signed)
 This patient returns to the office for evaluation and treatment of long thick painful nails .  This patient is unable to trim her own nails since the patient cannot reach her feet.  Patient says the nails are painful walking and wearing his shoes.    She returns for preventive foot care services.  General Appearance  Alert, conversant and in no acute stress.  Vascular  Dorsalis pedis and posterior tibial  pulses are weakly  palpable  bilaterally.  Capillary return is within normal limits  bilaterally. Temperature is within normal limits  bilaterally.  Neurologic  Senn-Weinstein monofilament wire test within normal limits  bilaterally. Muscle power within normal limits bilaterally.  Nails Thick disfigured discolored nails with subungual debris  from hallux to fifth toes bilaterally. No evidence of bacterial infection or drainage bilaterally.  Orthopedic  No limitations of motion  feet .  No crepitus or effusions noted.  No bony pathology or digital deformities noted.  Hallux varus 1st MPJ and hallux malleus right foot.   Hammer toes 2,3 right.  Skin  normotropic skin with no porokeratosis noted bilaterally.  No signs of infections or ulcers noted.     Onychomycosis  Pain in toes right foot  Pain in toes left foot  Debridement  of nails  1-5  B/L with a nail nipper.  Nails were then filed using a dremel tool with no incidents.   RTC 4  months    Helane Gunther DPM

## 2023-12-15 DIAGNOSIS — M65331 Trigger finger, right middle finger: Secondary | ICD-10-CM | POA: Diagnosis not present

## 2023-12-15 DIAGNOSIS — M24541 Contracture, right hand: Secondary | ICD-10-CM | POA: Diagnosis not present

## 2023-12-15 DIAGNOSIS — M65341 Trigger finger, right ring finger: Secondary | ICD-10-CM | POA: Diagnosis not present

## 2023-12-24 ENCOUNTER — Other Ambulatory Visit (HOSPITAL_BASED_OUTPATIENT_CLINIC_OR_DEPARTMENT_OTHER): Payer: Self-pay

## 2023-12-24 ENCOUNTER — Encounter (HOSPITAL_BASED_OUTPATIENT_CLINIC_OR_DEPARTMENT_OTHER): Payer: Self-pay

## 2023-12-24 ENCOUNTER — Emergency Department (HOSPITAL_BASED_OUTPATIENT_CLINIC_OR_DEPARTMENT_OTHER): Admitting: Radiology

## 2023-12-24 ENCOUNTER — Emergency Department (HOSPITAL_BASED_OUTPATIENT_CLINIC_OR_DEPARTMENT_OTHER)

## 2023-12-24 ENCOUNTER — Emergency Department (HOSPITAL_BASED_OUTPATIENT_CLINIC_OR_DEPARTMENT_OTHER)
Admission: EM | Admit: 2023-12-24 | Discharge: 2023-12-24 | Disposition: A | Discharge status: Home / Self Care | Attending: Emergency Medicine | Admitting: Emergency Medicine

## 2023-12-24 ENCOUNTER — Other Ambulatory Visit: Payer: Self-pay

## 2023-12-24 DIAGNOSIS — M4312 Spondylolisthesis, cervical region: Secondary | ICD-10-CM | POA: Diagnosis not present

## 2023-12-24 DIAGNOSIS — M47812 Spondylosis without myelopathy or radiculopathy, cervical region: Secondary | ICD-10-CM | POA: Diagnosis not present

## 2023-12-24 DIAGNOSIS — M25512 Pain in left shoulder: Secondary | ICD-10-CM | POA: Insufficient documentation

## 2023-12-24 DIAGNOSIS — Z7982 Long term (current) use of aspirin: Secondary | ICD-10-CM | POA: Diagnosis not present

## 2023-12-24 DIAGNOSIS — M25519 Pain in unspecified shoulder: Secondary | ICD-10-CM | POA: Diagnosis not present

## 2023-12-24 DIAGNOSIS — Z743 Need for continuous supervision: Secondary | ICD-10-CM | POA: Diagnosis not present

## 2023-12-24 DIAGNOSIS — S2242XA Multiple fractures of ribs, left side, initial encounter for closed fracture: Secondary | ICD-10-CM | POA: Diagnosis not present

## 2023-12-24 DIAGNOSIS — W19XXXA Unspecified fall, initial encounter: Secondary | ICD-10-CM | POA: Diagnosis not present

## 2023-12-24 DIAGNOSIS — S20302A Unspecified superficial injuries of left front wall of thorax, initial encounter: Secondary | ICD-10-CM | POA: Diagnosis present

## 2023-12-24 DIAGNOSIS — M47814 Spondylosis without myelopathy or radiculopathy, thoracic region: Secondary | ICD-10-CM | POA: Insufficient documentation

## 2023-12-24 DIAGNOSIS — Z79899 Other long term (current) drug therapy: Secondary | ICD-10-CM | POA: Diagnosis not present

## 2023-12-24 DIAGNOSIS — S0990XA Unspecified injury of head, initial encounter: Secondary | ICD-10-CM | POA: Insufficient documentation

## 2023-12-24 DIAGNOSIS — I6782 Cerebral ischemia: Secondary | ICD-10-CM | POA: Diagnosis not present

## 2023-12-24 DIAGNOSIS — S199XXA Unspecified injury of neck, initial encounter: Secondary | ICD-10-CM | POA: Diagnosis not present

## 2023-12-24 LAB — BASIC METABOLIC PANEL WITH GFR
Anion gap: 13 (ref 5–15)
BUN: 13 mg/dL (ref 8–23)
CO2: 22 mmol/L (ref 22–32)
Calcium: 10 mg/dL (ref 8.9–10.3)
Chloride: 104 mmol/L (ref 98–111)
Creatinine, Ser: 0.67 mg/dL (ref 0.44–1.00)
GFR, Estimated: >60 mL/min (ref >60)
Glucose, Bld: 97 mg/dL (ref 70–99)
Potassium: 4.3 mmol/L (ref 3.5–5.1)
Sodium: 139 mmol/L (ref 135–145)

## 2023-12-24 LAB — CBC WITH DIFFERENTIAL/PLATELET
Abs Immature Granulocytes: 0.07 K/uL (ref 0.00–0.07)
Basophils Absolute: 0 K/uL (ref 0.0–0.1)
Basophils Relative: 0 %
Eosinophils Absolute: 0.2 K/uL (ref 0.0–0.5)
Eosinophils Relative: 2 %
HCT: 39.9 % (ref 36.0–46.0)
Hemoglobin: 13.2 g/dL (ref 12.0–15.0)
Immature Granulocytes: 1 %
Lymphocytes Relative: 17 %
Lymphs Abs: 1.5 K/uL (ref 0.7–4.0)
MCH: 30.3 pg (ref 26.0–34.0)
MCHC: 33.1 g/dL (ref 30.0–36.0)
MCV: 91.7 fL (ref 80.0–100.0)
Monocytes Absolute: 0.6 K/uL (ref 0.1–1.0)
Monocytes Relative: 7 %
Neutro Abs: 6.3 K/uL (ref 1.7–7.7)
Neutrophils Relative %: 73 %
Platelets: 72 K/uL — ABNORMAL LOW (ref 150–400)
RBC: 4.35 MIL/uL (ref 3.87–5.11)
RDW: 13.2 % (ref 11.5–15.5)
WBC: 8.7 K/uL (ref 4.0–10.5)
nRBC: 0 % (ref 0.0–0.2)

## 2023-12-24 MED ORDER — TRAMADOL HCL 50 MG PO TABS
50.0000 mg | ORAL_TABLET | Freq: Two times a day (BID) | ORAL | 0 refills | Status: AC | PRN
  Filled 2023-12-24: qty 14, 7d supply, fill #0

## 2023-12-24 MED ORDER — ACETAMINOPHEN 500 MG PO TABS
1000.0000 mg | ORAL_TABLET | Freq: Once | ORAL | Status: AC
  Administered 2023-12-24: 1000 mg via ORAL
  Filled 2023-12-24: qty 2

## 2023-12-24 NOTE — ED Triage Notes (Addendum)
 Arrives via EMS from home due to having a mechanical fall overnight. Patient was complaining of left shoulder pain when her caregiver arrived to her home (lives alone) and EMS was called. Patient does have dementia at baseline.    BP 156/88 98% on Room air 62 HR 96 CBG

## 2023-12-24 NOTE — Discharge Instructions (Signed)
 As we discussed, you have 2 broken ribs due to the fall last night. All other tests are reassuring. You are able to be discharged home and should follow up with your doctor for recheck in the next week to 2 weeks.   Use the incentive spirometer throughout the day to prevent development of a lung infection.   Return to the ED with any new or concerning symptoms at any time. Tylenol  as needed for pain. If severe pain, take one Tramadol  if needed, maximum dose twice daily.

## 2023-12-24 NOTE — ED Notes (Signed)
 Pt placed on posey alarm d/t dementia hx. Family at bedside

## 2023-12-24 NOTE — ED Notes (Signed)
 Patient transported to CT

## 2023-12-24 NOTE — ED Notes (Addendum)
 Patient able to walk with walker from bed into hallway and back

## 2023-12-24 NOTE — ED Provider Notes (Signed)
 [place] [facility]MENT AT [facility] Provider Note   CSN: [id number] Arrival date & time: 12/24/23  9043     Patient presents with: Fall and Shoulder Pain (left)   Darlene [name] is a 89 y.o. female.   Patient to ED from home reporting she fell during the night. She has a history of dementia and cannot remember details as to why she feel. She reports pain in the posterior left shoulder. She does not feel she hit her head and denies neck pain. Not anticoagulated. She denies chest, abdominal, pelvic or LE pain. She lives alone so the fall was unwitnessed. Caregivers in during the day.   The history is provided by the patient, a relative and the EMS personnel. No language interpreter was used.  Fall  Shoulder Pain      Prior to Admission medications   Medication Sig Start Date End Date Taking? Authorizing Provider  traMADol  (ULTRAM ) 50 MG tablet Take 1 tablet (50 mg total) by mouth every 12 (twelve) hours as needed. 12/24/23  Yes Sumie Remsen, Margit, PA-C  acetaminophen  (TYLENOL ) 500 MG tablet Take 1 tablet (500 mg total) by mouth every 6 (six) hours as needed for mild pain. 04/19/16   Griselda Norris, MD  aspirin  81 MG tablet Take 81 mg by mouth daily.    [provider]  betamethasone  dipropionate 0.05 % cream     [provider]  cholecalciferol (VITAMIN D ) 1000 UNITS tablet Take 1,000 Units by mouth daily.    [provider]  citalopram  (CELEXA ) 40 MG tablet Take 40 mg by mouth daily.     [provider]  Clobetasol Propionate 0.05 % shampoo     [provider]  Coenzyme Q10 (CO Q 10) 100 MG CAPS See admin instructions.    [provider]  Cyanocobalamin  (VITAMIN B-12 PO) Take 1,000 mcg by mouth daily.    [provider]  diclofenac  Sodium (VOLTAREN ) 1 % GEL     [provider]  ergocalciferol  (VITAMIN D2) 1.25 MG (50000 UT) capsule  06/18/18   [provider]  esomeprazole (NEXIUM)  40 MG capsule Take 40 mg by mouth 2 (two) times daily before a meal.    [provider]  famciclovir  (FAMVIR ) 125 MG tablet  08/15/18   [provider]  Iron , Ferrous Sulfate , 325 (65 Fe) MG TABS Take 1 tablet by mouth every other day. 04/06/23   Lenon Marien LITTIE, MD  levothyroxine  (SYNTHROID , LEVOTHROID) 75 MCG tablet Take 75 mcg by mouth daily before breakfast.     [provider]  lidocaine  (LIDODERM ) 5 % Place 1 patch onto the skin daily. Remove & Discard patch within 12 hours or as directed by MD 03/17/23   Melvenia Motto, MD  lidocaine  (LIDODERM ) 5 % Place 1 patch onto the skin daily. Remove & Discard patch within 12 hours or as directed by MD 09/01/23   Tegeler, Lonni PARAS, MD  lovastatin (MEVACOR) 20 MG tablet TAKE 1 TABLET BY MOUTH ONCEDAILY AT BEDTIME    [provider]  magnesium  oxide (MAG-OX) 400 MG tablet Take 400 mg by mouth daily.    [provider]  methocarbamol  (ROBAXIN ) 500 MG tablet Take 1 tablet (500 mg total) by mouth every 8 (eight) hours as needed for muscle spasms. 03/17/23   Melvenia Motto, MD  metoprolol  succinate (TOPROL -XL) 25 MG 24 hr tablet Take 25 mg by mouth daily.     [provider]  mirabegron  ER (MYRBETRIQ ) 25 MG  TB24 tablet Take 25 mg by mouth daily.    [provider]  Multiple Vitamins-Minerals (MULTIVITAMINS THER. W/MINERALS) TABS Take 1 tablet by mouth daily.    [provider]  nystatin-triamcinolone  ointment (MYCOLOG) Apply 1 Application topically 2 (two) times daily.    [provider]  ondansetron  (ZOFRAN -ODT) 4 MG disintegrating tablet Take 1 tablet (4 mg total) by mouth every 8 (eight) hours as needed for nausea or vomiting. 03/29/23   Kommor, Madison, MD  oxyCODONE  (ROXICODONE ) 5 MG immediate release tablet Take 0.5 tablets (2.5 mg total) by mouth every 4 (four) hours as needed. 05/23/23   Yolande Lamar BROCKS, MD  Polyethyl Glycol-Propyl Glycol 0.4-0.3 % SOLN Apply 2 drops to  eye 2 (two) times daily as needed (for dry eyes).    [provider]  pregabalin  (LYRICA ) 50 MG capsule Take 50 mg by mouth 2 (two) times daily.    [provider]  Probiotic Product (PROBIOTIC PO) Take 1 tablet by mouth daily.    [provider]    Allergies: Oxycodone , Oxycodone  hcl, Sulfamethoxazole-trimethoprim, Codeine, Doxycycline, Meperidine and related, Sulfa antibiotics, and Sulfacetamide sodium    Review of Systems  Updated Vital Signs BP (!) 150/85   Pulse 68   Temp 97.9 F (36.6 C) (Oral)   Resp 18   Ht 5' 2 (1.575 m)   Wt 64.9 kg   SpO2 94%   BMI 26.17 kg/m   Physical Exam Vitals and nursing note reviewed.  Constitutional:      Appearance: She is well-developed.  HENT:     Head: Normocephalic and atraumatic.     Mouth/Throat:     Mouth: Mucous membranes are moist.  Eyes:     Pupils: Pupils are equal, round, and reactive to light.  Neck:     Comments: No midline tenderness.  Cardiovascular:     Rate and Rhythm: Normal rate and regular rhythm.     Heart sounds: No murmur heard. Pulmonary:     Effort: Pulmonary effort is normal.     Breath sounds: Normal breath sounds. No wheezing, rhonchi or rales.  Chest:     Chest wall: No tenderness.  Abdominal:     Palpations: Abdomen is soft.     Tenderness: There is no abdominal tenderness. There is no guarding or rebound.  Musculoskeletal:        General: Normal range of motion.     Cervical back: Normal range of motion and neck supple.     Comments: No hip or pelvic tenderness. Full movement in bilateral LE's without limitation. Right upper extremity nontender with full range. Left scapular tenderness without bruising or deformity. There is mild left elbow tenderness with pronation/supination..  Skin:    General: Skin is warm and dry.     Comments: Small skin tear to proximal forearm on left.   Neurological:     General: No focal deficit present.     Mental Status: She is alert and  oriented to person, place, and time.     (all labs ordered are listed, but only abnormal results are displayed) Labs Reviewed  CBC WITH DIFFERENTIAL/PLATELET - Abnormal; Notable for the following components:      Result Value   Platelets 72 (*)    All other components within normal limits  BASIC METABOLIC PANEL WITH GFR  URINALYSIS, ROUTINE W REFLEX MICROSCOPIC    EKG: None  Radiology: CT Chest Wo Contrast Result Date: 12/24/2023 CLINICAL DATA:  Left shoulder pain after fall last  night. EXAM: CT CHEST WITHOUT CONTRAST TECHNIQUE: Multidetector CT imaging of the chest was performed following the standard protocol without IV contrast. RADIATION DOSE REDUCTION: This exam was performed according to the departmental dose-optimization program which includes automated exposure control, adjustment of the mA and/or kV according to patient size and/or use of iterative reconstruction technique. COMPARISON:  May 23, 2023. FINDINGS: Cardiovascular: Atherosclerosis of thoracic aorta without aneurysm formation. Normal cardiac size. No pericardial effusion. Coronary artery calcifications are noted. Mediastinum/Nodes: No enlarged mediastinal or axillary lymph nodes. Thyroid  gland, trachea, and esophagus demonstrate no significant findings. Lungs/Pleura: No pneumothorax or pleural effusion is noted. Mild right lower lobe airspace opacity is noted concerning for pneumonia or atelectasis. Left lower lobe scarring or subsegmental atelectasis is again noted. Stable scarring noted laterally in right upper lobe. Upper Abdomen: No acute abnormality. Musculoskeletal: Moderately displaced old sternal fracture is noted. Old T9 and T10 compression fractures are noted. Moderately displaced fracture is seen involving the posterior portion of left eighth rib. Nondisplaced acute fracture is seen involving the posterior portion of the left seventh rib. IMPRESSION: 1. Mild right lower lobe airspace opacity is noted concerning  for pneumonia or atelectasis. 1. Acute left seventh and eighth rib fractures. 1. Moderately displaced old sternal fracture is noted. Old T9 and T10 compression fractures are noted. 2. Coronary artery calcifications are noted. Aortic Atherosclerosis (ICD10-I70.0). Electronically Signed   By: Lynwood Landy Raddle M.D.   On: 12/24/2023 15:16   DG Scapula Left Result Date: 12/24/2023 CLINICAL DATA:  Fall.  LEFT shoulder pain. EXAM: LEFT SCAPULA - 2+ VIEWS; LEFT ELBOW - COMPLETE 3+ VIEW COMPARISON:  None available FINDINGS: LEFT elbow: No fracture or dislocation.  No acute soft tissue abnormality. LEFT scapula: Deformity in the posterolateral segments of the LEFT second, third, and fourth ribs are consistent with remote healed fractures. There are acute displaced fractures at the lateral segments of the LEFT sixth and seventh ribs. There is a minimally displaced fracture at the inferior tip of the scapula. IMPRESSION: 1. No acute fracture or dislocation of the LEFT elbow. 2. Acute displaced fractures of the lateral segments of the LEFT sixth and seventh ribs. 3. Minimally displaced fracture at the inferior tip of the LEFT scapula. Electronically Signed   By: Aliene Lloyd M.D.   On: 12/24/2023 12:48   DG Elbow Complete Left Result Date: 12/24/2023 CLINICAL DATA:  Fall.  LEFT shoulder pain. EXAM: LEFT SCAPULA - 2+ VIEWS; LEFT ELBOW - COMPLETE 3+ VIEW COMPARISON:  None available FINDINGS: LEFT elbow: No fracture or dislocation.  No acute soft tissue abnormality. LEFT scapula: Deformity in the posterolateral segments of the LEFT second, third, and fourth ribs are consistent with remote healed fractures. There are acute displaced fractures at the lateral segments of the LEFT sixth and seventh ribs. There is a minimally displaced fracture at the inferior tip of the scapula. IMPRESSION: 1. No acute fracture or dislocation of the LEFT elbow. 2. Acute displaced fractures of the lateral segments of the LEFT sixth and seventh  ribs. 3. Minimally displaced fracture at the inferior tip of the LEFT scapula. Electronically Signed   By: Aliene Lloyd M.D.   On: 12/24/2023 12:48   CT Head Wo Contrast Result Date: 12/24/2023 CLINICAL DATA:  Provided history: Head trauma, minor. Neck trauma. Additional history provided: Fall. History of dementia. EXAM: CT HEAD WITHOUT CONTRAST CT CERVICAL SPINE WITHOUT CONTRAST TECHNIQUE: Multidetector CT imaging of the head and cervical spine was performed following the standard protocol without intravenous contrast.  Multiplanar CT image reconstructions of the cervical spine were also generated. RADIATION DOSE REDUCTION: This exam was performed according to the departmental dose-optimization program which includes automated exposure control, adjustment of the mA and/or kV according to patient size and/or use of iterative reconstruction technique. COMPARISON:  Head CT 05/23/2023. cervical spine CT 05/22/2023. FINDINGS: CT HEAD FINDINGS Brain: Generalized cerebral and cerebellar atrophy. Redemonstrated prominent perivascular space within the left parietal white matter. Mild-to-moderate patchy and ill-defined hypoattenuation elsewhere within the cerebral white matter, nonspecific but compatible with chronic small vessel ischemic disease. There is no acute intracranial hemorrhage. No demarcated cortical infarct. No extra-axial fluid collection. No evidence of an intracranial mass. No midline shift. Vascular: No hyperdense vessel.  Atherosclerotic calcifications. Skull: No calvarial fracture or aggressive osseous lesion. Sinuses/Orbits: No mass or acute finding within the imaged orbits. 8 mm right ethmoid sinus osteoma. Left sphenoid sinusitis. CT CERVICAL SPINE FINDINGS Alignment: Nonspecific straightening of the expected cervical lordosis. Levocurvature of the cervical and upper thoracic spine (centered at the cervicothoracic junction). 2 mm grade 1 anterolisthesis at C2-C3 and C3-C4. Skull base and vertebrae:  The basion-dental and atlanto-dental intervals are maintained.No evidence of acute fracture to the cervical spine. C4-C5 vertebral ankylosis. Soft tissues and spinal canal: No prevertebral fluid or swelling. No visible canal hematoma. Disc levels: Cervical spondylosis with multilevel disc space narrowing, disc bulges/central disc protrusions, posterior disc osteophyte complexes, endplate spurring, uncovertebral hypertrophy and facet arthropathy. At the non-fused levels, disc space narrowing is greatest at C3-C4 (moderate to advanced), C5-C6 (advanced), C6-C7 (advanced) and T2-T3 (advanced). No appreciable high-grade spinal canal stenosis. Multilevel bony neural foraminal narrowing. Upper chest: No consolidation within the imaged lung apices. No visible pneumothorax. Other: Redemonstrated chronic fracture deformity of the posterior left third rib. Enlarged left supraclavicular lymph node, measuring 1.9 x 1.4 cm in transaxial dimensions (series 2, image 60). IMPRESSION: CT head: 1.  No evidence of an acute intracranial abnormality. 2. Parenchymal atrophy and chronic small vessel ischemic disease. 3. Left sphenoid sinusitis. CT cervical spine: 1. No evidence of an acute cervical spine fracture. 2. Nonspecific straightening of the expected cervical lordosis. 3. Levocurvature of the cervical and upper thoracic spine (centered at the cervicothoracic junction). 4. Mild grade 1 anterolisthesis at C2-C3 and C3-C4. 5. Spondylosis at the cervical and visible upper thoracic levels, as described. 6. C4-C5 vertebral ankylosis. 7. Redemonstrated chronic fracture deformity of the posterior left third rib. 8. Enlarged left supraclavicular lymph node, measuring 1.9 x 1.4 cm. Per the electronic medical record, the patient has a history of lymphoma and breast cancer. Electronically Signed   By: Rockey Childs D.O.   On: 12/24/2023 12:15   CT Cervical Spine Wo Contrast Result Date: 12/24/2023 CLINICAL DATA:  Provided history: Head  trauma, minor. Neck trauma. Additional history provided: Fall. History of dementia. EXAM: CT HEAD WITHOUT CONTRAST CT CERVICAL SPINE WITHOUT CONTRAST TECHNIQUE: Multidetector CT imaging of the head and cervical spine was performed following the standard protocol without intravenous contrast. Multiplanar CT image reconstructions of the cervical spine were also generated. RADIATION DOSE REDUCTION: This exam was performed according to the departmental dose-optimization program which includes automated exposure control, adjustment of the mA and/or kV according to patient size and/or use of iterative reconstruction technique. COMPARISON:  Head CT 05/23/2023. cervical spine CT 05/22/2023. FINDINGS: CT HEAD FINDINGS Brain: Generalized cerebral and cerebellar atrophy. Redemonstrated prominent perivascular space within the left parietal white matter. Mild-to-moderate patchy and ill-defined hypoattenuation elsewhere within the cerebral white matter, nonspecific but compatible with chronic  small vessel ischemic disease. There is no acute intracranial hemorrhage. No demarcated cortical infarct. No extra-axial fluid collection. No evidence of an intracranial mass. No midline shift. Vascular: No hyperdense vessel.  Atherosclerotic calcifications. Skull: No calvarial fracture or aggressive osseous lesion. Sinuses/Orbits: No mass or acute finding within the imaged orbits. 8 mm right ethmoid sinus osteoma. Left sphenoid sinusitis. CT CERVICAL SPINE FINDINGS Alignment: Nonspecific straightening of the expected cervical lordosis. Levocurvature of the cervical and upper thoracic spine (centered at the cervicothoracic junction). 2 mm grade 1 anterolisthesis at C2-C3 and C3-C4. Skull base and vertebrae: The basion-dental and atlanto-dental intervals are maintained.No evidence of acute fracture to the cervical spine. C4-C5 vertebral ankylosis. Soft tissues and spinal canal: No prevertebral fluid or swelling. No visible canal hematoma.  Disc levels: Cervical spondylosis with multilevel disc space narrowing, disc bulges/central disc protrusions, posterior disc osteophyte complexes, endplate spurring, uncovertebral hypertrophy and facet arthropathy. At the non-fused levels, disc space narrowing is greatest at C3-C4 (moderate to advanced), C5-C6 (advanced), C6-C7 (advanced) and T2-T3 (advanced). No appreciable high-grade spinal canal stenosis. Multilevel bony neural foraminal narrowing. Upper chest: No consolidation within the imaged lung apices. No visible pneumothorax. Other: Redemonstrated chronic fracture deformity of the posterior left third rib. Enlarged left supraclavicular lymph node, measuring 1.9 x 1.4 cm in transaxial dimensions (series 2, image 60). IMPRESSION: CT head: 1.  No evidence of an acute intracranial abnormality. 2. Parenchymal atrophy and chronic small vessel ischemic disease. 3. Left sphenoid sinusitis. CT cervical spine: 1. No evidence of an acute cervical spine fracture. 2. Nonspecific straightening of the expected cervical lordosis. 3. Levocurvature of the cervical and upper thoracic spine (centered at the cervicothoracic junction). 4. Mild grade 1 anterolisthesis at C2-C3 and C3-C4. 5. Spondylosis at the cervical and visible upper thoracic levels, as described. 6. C4-C5 vertebral ankylosis. 7. Redemonstrated chronic fracture deformity of the posterior left third rib. 8. Enlarged left supraclavicular lymph node, measuring 1.9 x 1.4 cm. Per the electronic medical record, the patient has a history of lymphoma and breast cancer. Electronically Signed   By: Rockey Childs D.O.   On: 12/24/2023 12:15     Procedures   Medications Ordered in the ED  acetaminophen  (TYLENOL ) tablet 1,000 mg (1,000 mg Oral Given 12/24/23 1121)    Clinical Course as of 12/24/23 1557  Fri Dec 24, 2023  1149 Patient seen today after fall during the night. Patient has demential and is considered an unreliable historian, however, lives in her own  home and is alone at night, with caregivers in during the day. She reports pain in the left posterior shoulder, and on exam the left elbow. No SOB.   Labs obtained, imaging ordered [SU]  1341 Imaging per radiologist: IMPRESSION: 1. No acute fracture or dislocation of the LEFT elbow. 2. Acute displaced fractures of the lateral segments of the LEFT sixth and seventh ribs. 3. Minimally displaced fracture at the inferior tip of the LEFT scapula.  Because of the displaced ribs, scapula fx, CT chest ordered for detailed evaluation. Patient denies difficulty breathing. No hypoxia or tachypnea.   Patient and daughter updated on events.   [SU]  1545 Patient is found to have rib fractures. CT chest ordered for full evaluation.  Per radiology: Narrative & Impression CLINICAL DATA:  Left shoulder pain after fall last night.   EXAM: CT CHEST WITHOUT CONTRAST   IMPRESSION: 1. Mild right lower lobe airspace opacity is noted concerning for pneumonia or atelectasis. 1. Acute left seventh and eighth rib fractures.  1. Moderately displaced old sternal fracture is noted. Old T9 and T10 compression fractures are noted. 2. Coronary artery calcifications are noted.  Patient is not SOB, has no cough or fever and has had not fever. Doubt pneumonia.  [SU]  1548 Discussed discharge home with patient and daughter. Daughter has arranged care at home through the night. Will recommend continue Tylenol  for pain.   The patient was ambulated in the department and, per daughter, mobility here is at her baseline. [SU]    Clinical Course User Index [SU] Odell Balls, PA-C                                 Medical Decision Making Amount and/or Complexity of Data Reviewed Labs: ordered. Radiology: ordered.  Risk OTC drugs.        Final diagnoses:  Fall, initial encounter  Closed fracture of multiple ribs of left side, initial encounter    ED Discharge Orders          Ordered    traMADol   (ULTRAM ) 50 MG tablet  Every 12 hours PRN        12/24/23 1557               Odell Balls, PA-C 12/24/23 1557    Patsey Lot, MD 12/25/23 2253

## 2023-12-29 DIAGNOSIS — S41111A Laceration without foreign body of right upper arm, initial encounter: Secondary | ICD-10-CM | POA: Diagnosis not present

## 2023-12-29 DIAGNOSIS — W540XXA Bitten by dog, initial encounter: Secondary | ICD-10-CM | POA: Diagnosis not present

## 2024-01-26 DIAGNOSIS — M65341 Trigger finger, right ring finger: Secondary | ICD-10-CM | POA: Diagnosis not present

## 2024-01-26 DIAGNOSIS — M65331 Trigger finger, right middle finger: Secondary | ICD-10-CM | POA: Diagnosis not present

## 2024-01-26 DIAGNOSIS — M24541 Contracture, right hand: Secondary | ICD-10-CM | POA: Diagnosis not present

## 2024-02-07 DIAGNOSIS — B372 Candidiasis of skin and nail: Secondary | ICD-10-CM | POA: Diagnosis not present

## 2024-02-19 DIAGNOSIS — M79671 Pain in right foot: Secondary | ICD-10-CM | POA: Diagnosis not present

## 2024-02-28 DIAGNOSIS — M810 Age-related osteoporosis without current pathological fracture: Secondary | ICD-10-CM | POA: Diagnosis not present

## 2024-02-29 DIAGNOSIS — S62356A Nondisplaced fracture of shaft of fifth metacarpal bone, right hand, initial encounter for closed fracture: Secondary | ICD-10-CM | POA: Diagnosis not present

## 2024-03-02 DIAGNOSIS — Z974 Presence of external hearing-aid: Secondary | ICD-10-CM | POA: Diagnosis not present

## 2024-03-02 DIAGNOSIS — H6123 Impacted cerumen, bilateral: Secondary | ICD-10-CM | POA: Diagnosis not present

## 2024-03-06 ENCOUNTER — Encounter: Payer: Self-pay | Admitting: Radiology

## 2024-03-14 DIAGNOSIS — K219 Gastro-esophageal reflux disease without esophagitis: Secondary | ICD-10-CM | POA: Diagnosis not present

## 2024-03-14 DIAGNOSIS — E785 Hyperlipidemia, unspecified: Secondary | ICD-10-CM | POA: Diagnosis not present

## 2024-03-14 DIAGNOSIS — J449 Chronic obstructive pulmonary disease, unspecified: Secondary | ICD-10-CM | POA: Diagnosis not present

## 2024-03-14 DIAGNOSIS — M810 Age-related osteoporosis without current pathological fracture: Secondary | ICD-10-CM | POA: Diagnosis not present

## 2024-03-14 DIAGNOSIS — I700 Atherosclerosis of aorta: Secondary | ICD-10-CM | POA: Diagnosis not present

## 2024-03-14 DIAGNOSIS — Z23 Encounter for immunization: Secondary | ICD-10-CM | POA: Diagnosis not present

## 2024-03-14 DIAGNOSIS — E039 Hypothyroidism, unspecified: Secondary | ICD-10-CM | POA: Diagnosis not present

## 2024-03-14 DIAGNOSIS — D696 Thrombocytopenia, unspecified: Secondary | ICD-10-CM | POA: Diagnosis not present

## 2024-03-28 DIAGNOSIS — S61411A Laceration without foreign body of right hand, initial encounter: Secondary | ICD-10-CM | POA: Diagnosis not present

## 2024-04-04 DIAGNOSIS — R000 Tachycardia, unspecified: Secondary | ICD-10-CM | POA: Diagnosis not present

## 2024-04-04 DIAGNOSIS — Z046 Encounter for general psychiatric examination, requested by authority: Secondary | ICD-10-CM | POA: Diagnosis not present

## 2024-04-04 DIAGNOSIS — R0902 Hypoxemia: Secondary | ICD-10-CM | POA: Diagnosis not present

## 2024-04-04 DIAGNOSIS — I10 Essential (primary) hypertension: Secondary | ICD-10-CM | POA: Diagnosis not present

## 2024-04-04 DIAGNOSIS — F29 Unspecified psychosis not due to a substance or known physiological condition: Secondary | ICD-10-CM | POA: Diagnosis not present

## 2024-04-04 DIAGNOSIS — F0390 Unspecified dementia without behavioral disturbance: Secondary | ICD-10-CM | POA: Diagnosis not present

## 2024-04-10 ENCOUNTER — Ambulatory Visit: Admitting: Podiatry
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
## 2024-05-18 NOTE — Telephone Encounter (Signed)
 Patients daughter is calling in states her mother was prescribed mirtazapine (REMERON) 15 mg tablet [[id number]] . She is always skeptical about taking her new prescriptions she has to ask her previous doctor if its okay . She said she has a hard time sometimes to take her medications. She did end up stating she will take it tonight. Requesting a call back just would like to inform the provider. Call back number [phone]
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
## 2024-05-24 NOTE — ED Notes (Signed)
 ED social work notified about patients concern with going back to her caregivers home.   Kaitlen M Shelton, RN 05/24/24 [phone]

## 2024-05-24 NOTE — ED Notes (Signed)
 Pt removed from bed pan.   Kaitlen M Shelton, RN 05/24/24 [phone]

## 2024-05-24 NOTE — ED Notes (Signed)
 Pt placed on bedpan, pt educated to use call bell when finished.   Jackolyn CHRISTELLA Lamer, RN 05/24/24 [phone]
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
## 2024-05-26 NOTE — ED Notes (Signed)
 Back brace applied by [facility] staff at this time.   Reyes Caro Lusty, RN 05/26/24 [phone]
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
## 2024-05-27 NOTE — Progress Notes (Signed)
 "  HOSPITAL MEDICINE -  PROGRESS NOTE  Hospital Day: #1   Admission Date:  05/26/2024   PCP:  Darlene JONETTA Hives, MD   Extended Emergency Contact Information Primary Emergency Contact: Darlene [name] Mobile Phone: [phone] Relation: Daughter   Hospital Course: No notes on file  Discharge Readiness:  Timeframe: May 30, 2024 Dispo to: Home vs SNF Barriers to Discharge: biopsy, spine clearance, PT/OT  Subjective  No acute events overnight. Patient sleeping and declined to participate in interview. No concerns/needs per bedside sitter.  Complete ROS not obtained d/t cognitive impairment.  Assessment/Plan  Darlene [name] is a 89 y.o. female with MH of COPD, depression, hearing loss, dementia, breast cancer, bladder cancer and non-hodgkin's lymphoma who is being admitted for concern for possible gastric malignancy noted on CT imaging.   #Concern for Possible New Onset Malignancy #Gastric Fundal Thickening and Enhancement -CT imaging showed gastric fundal thickening and enhancement which raises concern for gastric malignancy, particularly lymphoma -Enlarged left supraclavicular and retroperitoneal lymph nodes were noted as well concerning for metastatic disease given her other clinical findings -Patient unable to provide much further history 2/2 mental status and underlying dementia; consider GOC discussion before proceeding w/ biopsies. -Consult GI for endoscopic biopsy vs IR for biopsy of lymph nodes.  -Daily CBC, bmp, mag -NPO at MN for possible procedure in AM -Daily weights   #Acute Fracture of Lateral L 3rd and 4th Rib #Acute on Chronic vs Subacute T Spine Fractures -Patient noted to have fractures of Lat L 3rd/4th rib on XR and possible acute vs subacute T spine fractures -NSGY spine team was consulted in ED and provided TLSO brace for ambulation -Patient stated that she wasn't experiencing any significant pain on exam in the ED PLAN: -Neurosurgery spine team consulted  in ED and appreciate recs             -No acute interventions warranted -Obtain upright x-rays of the thoracic spine in TLSO -Spine precautions per Neurosurgery -Cervical spine precautions: No restrictions -Thoracolumbar spine precautions: TLSO when OOB for comfort  -Pain management plan: Tylenol  650 mg Q6H PRN, lidocaine  patch Q12H, voltaren  gel   #HX of Dementia -Patient has an underlying HX of dementia and became hyperactive while in the ED requiring olanzapine -Patient was able to calm down after administration of medication w/o any other acute behavioral issues PLAN: -bedside sitter ordered -delirium precautions ordered -Can consider addition of scheduled 2nd gen antipsychotic if patient has any further hyperactive behavioral episodes   Chronic Medical Problems: HLD: atorvastatin IDA: ferrous sulfate  Hypothyroid: synthroid  Hypomag: daily mag GERD: Omeprazole HTN: metoprolol  succinate  DVT Prophylaxis: Lovenox  SubQ  Active Medications: atorvastatin, 20 mg, oral, At Bedtime cyanocobalamin , 1,000 mcg, oral, Daily enoxaparin , 40 mg, subcutaneous, At Bedtime ferrous sulfate , 325 mg, oral, Daily before breakfast levothyroxine , 75 mcg, oral, Daily 0600 lidocaine , 1 patch, topical, Daily magnesium  oxide, 200 mg, oral, Daily metoprolol  succinate, 25 mg, oral, Daily mirtazapine, 15 mg, oral, At Bedtime omeprazole, 20 mg, oral, Daily 0600   Infusions:   Objective   Temp:  [97.5 F (36.4 C)-98.5 F (36.9 C)] 97.5 F (36.4 C) Heart Rate:  [60-89] 60 Resp:  [16-18] 18 BP: (114-142)/(58-80) 121/63    Physical Exam General: Somnolent, uncooperative, NAD  Head-Eyes-Ears-Nose-Throat: EOMI, PERRL,  oropharynx clear, no LAD.  Cardiovascular: RRR, s1, s2, no murmurs.  Chest/Lungs: CTAB, no wheezes or rhonchi.  Abdomen: +BS, soft, NT/ND  Extremities: 2+ peripheral pulses  Skin: No rash/ulceration     Labs  Results from last 2 days  Lab Units 05/27/24 0645  05/26/24 1829  WHITE BLOOD CELL COUNT 10*3/uL 6.50 8.10  HEMOGLOBIN g/dL 87.0 86.8  HEMATOCRIT % 39.3 39.8  PLATELET COUNT 10*3/uL 114* 141*    Results from last 2 days  Lab Units 05/27/24 0645 05/26/24 2045 05/26/24 1829  SODIUM mmol/L 139  --  140  POTASSIUM mmol/L 3.7 4.1 5.6*  CHLORIDE mmol/L 108*  --  111*  CO2 mmol/L 25  --  22  BUN mg/dL 18  --  18  CREATININE mg/dL 9.32  --  9.37  GLUCOSE mg/dL 899*  --  91  CALCIUM  mg/dL 9.4  --  9.4    Results from last 2 days  Lab Units 05/27/24 0645  MAGNESIUM  mg/dL 1.8*      Darlene KATHEE Louder, MD    "
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
## 2024-05-28 NOTE — Progress Notes (Signed)
 "  HOSPITAL MEDICINE -  PROGRESS NOTE  Hospital Day: #2   Admission Date:  05/26/2024   PCP:  Ryan JONETTA Hives, MD   Extended Emergency Contact Information Primary Emergency Contact: [place] Mobile Phone: [phone] Relation: Daughter   Hospital Course: No notes on file  Discharge Readiness:  Timeframe: May 30, 2024 Dispo to: Home vs SNF Barriers to Discharge: biopsy, spine clearance, PT/OT  Subjective  No acute events overnight. Patient remains somnolent and forgetful but is currently calm. She states pain is not well controlled. Daughter updated by phone, was informed of MD concern that between physical frailty and severe dementia she may have a difficult time pursuing chemotherapy, but if they desire to follow up with Hem/Onc and at least learn what her treatment options are then we can arrange for biopsy either in-house or as an outpatient. Daughter requested we update her primary oncologist at [facility], requested time to discuss options w/ her family before making a decision.  Complete ROS not obtained d/t cognitive impairment.  Assessment/Plan  Darlene [name] is a 89 y.o. female with MH of COPD, depression, hearing loss, dementia, breast cancer, bladder cancer and non-hodgkin's lymphoma who is being admitted for concern for possible gastric malignancy noted on CT imaging.   #Concern for Possible New Onset Malignancy #Gastric Fundal Thickening and Enhancement -CT imaging showed gastric fundal thickening and enhancement which raises concern for gastric malignancy, particularly lymphoma -Enlarged left supraclavicular and retroperitoneal lymph nodes were noted as well concerning for metastatic disease given her other clinical findings -Patient unable to provide much further history 2/2 mental status and underlying dementia; consider GOC discussion before proceeding w/ biopsies. If desired, consult GI for endoscopic biopsy vs IR for biopsy of lymph nodes.  -Continue to  address GOC w/ family. Update outpatient oncologist, consider Palliative Care consult.   #Acute Fracture of Lateral L 3rd and 4th Rib #Acute on Chronic vs Subacute T Spine Fractures -Patient noted to have fractures of Lat L 3rd/4th rib on XR and possible acute vs subacute T spine fractures -NSGY spine team was consulted in ED and provided TLSO brace for ambulation -Patient stated that she wasn't experiencing any significant pain on exam in the ED PLAN: -Neurosurgery spine team consulted in ED and appreciate recs             -No acute interventions warranted -Obtain upright x-rays of the thoracic spine in TLSO -Spine precautions per Neurosurgery -Cervical spine precautions: No restrictions -Thoracolumbar spine precautions: TLSO when OOB for comfort  -Pain management plan: Tylenol  1000 mg TID, tramadol  PRN, lidocaine  patch Q12H, voltaren  gel   #HX of Dementia -Patient has an underlying HX of dementia and became hyperactive while in the ED requiring olanzapine -Patient was able to calm down after administration of medication w/o any other acute behavioral issues PLAN: -bedside sitter ordered -delirium precautions ordered -PRN Seroquel for any further hyperactive behavioral episodes   Chronic Medical Problems: HLD: atorvastatin IDA: ferrous sulfate  Hypothyroid: synthroid  Hypomag: daily mag GERD: Omeprazole HTN: metoprolol  succinate  DVT Prophylaxis: Lovenox  SubQ  Active Medications: atorvastatin, 20 mg, oral, At Bedtime cyanocobalamin , 1,000 mcg, oral, Daily enoxaparin , 40 mg, subcutaneous, At Bedtime ferrous sulfate , 325 mg, oral, Daily before breakfast levothyroxine , 75 mcg, oral, Daily 0600 lidocaine , 1 patch, topical, Daily magnesium  oxide, 200 mg, oral, Daily metoprolol  succinate, 25 mg, oral, Daily mirtazapine, 15 mg, oral, At Bedtime omeprazole, 20 mg, oral, Daily 0600   Infusions:   Objective   Temp:  [97.8 F (36.6  C)-98 F (36.7 C)] 98 F (36.7 C) Heart  Rate:  [61-70] 66 Resp:  [17-18] 18 BP: (119-163)/(47-72) 146/68    Physical Exam General: Somnolent, difficult to rouse but answers questions, no distress  Head-Eyes-Ears-Nose-Throat: EOMI, pupils equal and reactive,  oropharynx clear, no LAD.  Cardiovascular: Reg rate/rhythm, s1, s2, no murmurs.  Chest/Lungs: Clear to auscultation b/l, no wheezes or rhonchi.  Abdomen: +BS, soft, nontender/nondistended  Extremities: 2+ peripheral pulses  Skin: No rash/ulceration     Labs   Results from last 2 days  Lab Units 05/28/24 0628 05/27/24 0645  WHITE BLOOD CELL COUNT 10*3/uL 5.80 6.50  HEMOGLOBIN g/dL 86.3 87.0  HEMATOCRIT % 41.3 39.3  PLATELET COUNT 10*3/uL 123* 114*    Results from last 2 days  Lab Units 05/28/24 0628 05/27/24 0645  SODIUM mmol/L 140 139  POTASSIUM mmol/L 4.2 3.7  CHLORIDE mmol/L 109* 108*  CO2 mmol/L 26 25  BUN mg/dL 18 18  CREATININE mg/dL 9.33 9.32  GLUCOSE mg/dL 898* 899*  CALCIUM  mg/dL 9.5 9.4    Results from last 2 days  Lab Units 05/28/24 0628 05/27/24 0645  MAGNESIUM  mg/dL 2.3 1.8*      [name], MD    "

## 2024-05-28 NOTE — Progress Notes (Signed)
 Case Management Screening  CSN: [id number] DOB: 06-28-1934 Service: Oncology Location: C729/A   Initial Screening Readmission Risk Score v2: 18.2 Risk Level: Low - Patient does not meet high risk criteria for post hospital services. Social Investment Banker, Operational will be available to assist as indicated. 89 y.o. female with a past medical history significant for COPD, depression, hearing loss, dementia who presents for Fall and behavioral concerns.    Norleen Kizzie Mackie III, RN

## 2024-05-28 NOTE — Care Plan (Signed)
" °  Problem: Compromised Skin Integrity Goal: Skin integrity is maintained or improved Description: Assess and monitor skin integrity. Identify patients at risk for skin breakdown on admission and per policy. Collaborate with interdisciplinary team and initiate plans and interventions as needed.    Outcome: Progressing Goal: Fluid and electrolyte balance are achieved/maintained Description: Assess and monitor vital signs (orthostatic vitals if applicable), fluid intake and output, urine color, labs, skin turgor, mucous membranes, jugular venous distention, edema, circumference of edematous extremities and abdominal girth, respiratory status, and mental status.  Monitor for signs and symptoms of hypovolemia (tachycardia, rapid breathing, decreased urine output, postural hypotension, confusion, syncope).  Monitor for signs and symptoms of hypervolemia (strong rapid pulse, shortness of breath, difficulty breathing lying down, crackles heard in lung fields, edema). Collaborate with interdisciplinary team and initiate plan and interventions as ordered. Outcome: Progressing Goal: Nutritional status is improving Description: Monitor and assess patient for malnutrition (ex- brittle hair, bruises, dry skin, pale skin and conjunctiva, muscle wasting, smooth red tongue, and disorientation). Collaborate with interdisciplinary team and initiate plan and interventions as ordered.  Monitor patient's weight and dietary intake as ordered or per policy. Utilize nutrition screening tool and intervene per policy. Determine patient's food preferences and provide high-protein, high-caloric foods as appropriate.  Outcome: Progressing   "

## 2024-05-29 NOTE — Group Note (Signed)
 Inpatient Care Coordination Team Conference Note  05/29/2024   Time:8:27 AM   CSN: [id number]  DOB: May 04, 1935   Room/Bed: C729/A LOS: 3 Payor Info: Payor: MEDICARE / Plan: MEDICARE A&B / Product Type: Medicare /    Admitting Diagnosis: Abnormal abdominal CT scan [R93.5] Fall, initial encounter [W19.XXXA]  Admit Date/Time: 05/26/2024  4:58 PM Admission Comments: No comment available   Primary Diagnosis: No Principal Problem: There is no principal problem currently on the Problem List. Please update the Problem List and refresh. Principal Problem: No Principal Problem: There is no principal problem currently on the Problem List. Please update the Problem List and refresh.  Predictive Model Details        17.5% (Medium)  Factor Value   Calculated 05/29/2024 08:05 18% Number of ED visits in last 90 days 3   Readmission Risk Score v2 Model 13% Braden score 16    8% Number of hospitalizations in last year 0    8% Latest RDW in last 72 hrs 13.7 %    6% Latest hemoglobin in last 72 hrs 13.2 g/dL     Team Members Present: Systems Developer, Nurse, Nutritionist, Pharmacist, Provider, Social Worker  Expected Discharge Date: May 30, 2024  Montie Bramble, MSW
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
## 2024-06-01 NOTE — Telephone Encounter (Signed)
 Ms. Browning is followed by Dr. [facility] [phone]) for her oncological care. I spoke with Erminio, RN-Dr Lonn nurse and provided clinical information regarding patients hospitalization and plan of care. Requested appointment be scheduled sooner rather than later. Per Erminio, Dr. Lonn has spoken with [facility] attending-Dr. Tobie today and discussed case. Patient/family are to notify Dr. Lonn office once patient has completed rehab (possibly seeking SNF placement) to get an appointment scheduled. Dr. Tobie will discuss this with patient's daughter and this information has been added to patients AVS.

## 2024-06-02 NOTE — Progress Notes (Signed)
 Case Management Update  Date: 06/02/2024   Time: 4:37 PM   Patient Type: Inpatient  MSW received secure chat from Attending MD asking MSW to confirm that patient's family will be picking patient up today for discharge.  MSW attempted to contact patient's daughter ([place]) but did not get an answer.  MSW left VM for daughter to let her know that patient is in need of a ride home.   Case Management Coordination Status: Coordination Complete    Anticipated Discharge Location: Home with Home Health  If Plan A discharging location is not feasible: Potential Plan B: Home with Home Health        Montie Bramble, MSW Social Worker [phone]

## 2024-06-02 NOTE — Progress Notes (Signed)
" °   06/02/24 1655  Reason Eval/Treat Not Completed  Reason Eval/Treat Not Completed RN request hold (this pm, due to agitation)   Dale Pan, PT "

## 2024-06-02 NOTE — Progress Notes (Signed)
 Palliative Care Progress Note   Date of service: 06/02/2024 Attending Physician: Mora Bertina Pries* Location at Time of Consult: Oncology  Reason for Consult: Goals of care Non-pain symptom management Diagnosis:Other Dementia with agitation, hx of breast and bladder cancer and non-hodgkin's lymphoma now with concern for new onset gastric malignancy vs. Progression of known disease   Assessment/Plan:  Summary: This 89 y.o. patient is seriously and acutely ill due to dementia with agitation, GLF with fracture of lateral left 3rd and 4th ribs, acute on chronic T spine fractures and concern for new malignancy, complicated by co-morbid acute and chronic conditions including COPD, depression, breast and bladder and non-hodgkin's lymphoma   Symptom Assessment/Recommendations:  Gastric fundal thickening and enhancement Lymphadenopathy  Hx of non-hodgkin's on surveillance - attempted to call daughter twice, pt more oriented today and tells me she would want a biopsy, plan for DC today. Will need further GOC discussion in the OP setting with primary oncologist  - full code, recommended DNR - family considering - would benefit from MOST form if not enrolling with hospice but that would be contingent on family being accessible to complete    Dementia with agitation Depression EKG without QTC prolongation  - question of if pt is taking lexapro at home, daughter reports she is. Can resume this at DC if so - decrease remeron to 7.5mg  nightly  - continue zyprexa to 5mg  BID - can give 5mg  zyprexa q 6hrs prn for agitation  - melatonin 3mg  nightly  - stop tramadol  and avoid trazadone    GLF Acute fracture of left 3rd and 4th ribs Acute on chronic vs. Subacute T spine fractures   - PT/OT if pt is agreeable and consistent with GOC - tylenol  prn for pain control  - stop tramadol  (pt not using)   Advance Care Planning Goals of Care/Decision Making Assessment/Recommendations: Decision making  capacity: No Healthcare Decision Maker if lacks capacity: Daughter, Charlton Hedge Advance Directive/MOST: No Acceptable/unacceptable treatment burdens:             - Code Status: Full Code Hospice eligibility if goals align: Yes, diagnosis:dementia, cancer Outpatient Follow-up: TBD  Goals of care discussed on 1/29, see palliative care documentation.  Today pt is a little more oriented. Still doesn't have full capacity, appears somewhat paranoid, and is easily distracted. She is able to tell me that she has cancer now in her shoulder and her stomach all over my body. She tells me she needs a biopsy but can't get that here. She says she wants to get a biopsy and would be agreeable to chemo or radiation if recommended. She says she previously underwent chemo/radiation in [place] and went to work with it and did well. She wants to go home with her family and follow up OP. Our conversation got interrupted as her daughter called her - I waited about 20 minutes in the room and asked to talk to her daughter myself but ultimately left as conversation was largely focused on her distrust of the CNA who is sitting with her and frustration over a movie that was put on in her room overnight that she felt was inappropriate. Unable to ask her about code status. I attempted to call daughter twice to follow up on conversation about hospice and code status but did not get an answer. Per primary team, pt being discharged today. Can follow up OP with primary oncologist.  No other interventions done today      Practical, Emotional, Spiritual Support Assessment and  Recommendations: Social support:  lives with daughter and granddaughter Personhood/values: independent, stubborn. When I ask her about if she has any issues with her memory she says I do. But young people do too, you probably forget things sometimes. I'm labeled because I'm old and that's not fair.  Spirituality/Spiritual Distress: Christian   Palliative  Care will continue to follow this patient. These recommendations have been discussed with the primary team and supporting staff caring for this patient.  Thank you for this consult.  Please contact consulting provider or secure chat [facility] Palliative Care 8a-5p M-F [facility] Palliative Care after hours/weekends) with any questions/needs.  Subjective:  Obtained from EHR and AI summary  89 y.o. female with a PMH of dementia, prior breast and bladder cancer, non-Hodgkins lymphoma, and multiple prior falls with progressive frailty and impaired mobility who was admitted after a fall and found to have concern for possible new gastric malignancy vs. Progression of known disease and found to have rib and vertebral fractures and increasing agitation in the setting of dementia. CT scans on admission showed displaced fractures of the left third and fourth ribs on the left and an acute on chronic thoracic and lumbar fractures. CT scan showed gastric fundal thickening and enhancement concerning for malignancy (particularly lymphoma), with enlarged left supraclavicular and retroperitoneal lymph nodes suggestive of metastatic disease. Her primary OP oncologist at [facility] was contacted who recommended she follow up OP with her to discuss further options prior to biopsy. Her course was complicated by worsening agitation related to dementia.    SUD hx: Deferred  Current status: More alert and oriented today but still does not have full capacity. Is frustrated her breakfast got here cold and she feels it was just hers. She says no doctor told her about the concern for cancer. When I reassured her they did but she didn't remember she says they didn't. She is frustrated about the sitter in her room and doesn't trust him. She wants to go home.    Objective:   Palliative Performance Scale (PPS) at time of consult: 40% mainly in  bed, can't do any work, extensive disease evidence, mainly assistance, normal or reduced intake, full or  drowsy or confusion. Dementia FAST: stage 6   eFI: 0.122 (Percentile = 46)  Clinical Frailty Scale:Clinical Frailty Scale: Class 6 - Moderately Frail. People who need help with all outside activities. Inside they may need help with stairs and bathing.   Visit Vitals BP 112/72 (BP Location: Left arm, Patient Position: Sitting)  Pulse 85  Temp 97.7 F (36.5 C) (Oral)  Resp 16  SpO2 95%     Weight change in the last 24 hours: Unable to Calculate   Physical Exam: Physical Exam  Gen: sitting up on the edge of the bed, appears younger than stated age, no distress  Neuro: Aox3 today, tangential and easily distracted  Psych: not agitated today and more redirectable but continues to have some paranoia  CV: RRR Resp: Unlabored respirations. GI: Non-distended. Soft. No TTP  Testing reviewed and interpreted: Yes Lab Results (last 24 hours)     Procedure Component Value Ref Range Date/Time   Basic Metabolic Panel [[id number]]  (Abnormal) Collected: 06/02/24 0721   Lab Status: Final result Specimen: Blood from Venous Updated: 06/02/24 0832    Sodium 140 136 - 145 mmol/L     Potassium 4.2 3.5 - 5.1 mmol/L     Comment: NO VISIBLE HEMOLYSIS      Chloride 105 98 - 107 mmol/L  CO2 29 21 - 31 mmol/L     Comment: High lactate dehydrogenase (LDH) concentrations in patient samples may cause falsely increased bicarbonate results. If markedly elevated LDH levels are suspected, please assess results in conjunction with patient's LDH values.      Anion Gap 6 6 - 14 mmol/L     Glucose, Random 90 70 - 99 mg/dL     Blood Urea Nitrogen (BUN) 16 7 - 25 mg/dL     Creatinine 9.40* 9.39 - 1.20 mg/dL     eGFR 87 >40 fO/fpw/8.26f7     Comment: GFR estimated by CKD-EPI equations(NKF 2021).   Recommend confirmation of Cr-based eGFR by using Cys-based eGFR and other filtration markers (if applicable) in complex cases and clinical decision-making, as needed.      Calcium  10.1 8.6 - 10.3 mg/dL      BUN/Creatinine Ratio 27.1* 10.0 - 20.0     Comment: Potential prerenal damage (e.g. CHF, GI Bleeding)     CBC with Differential [[id number]]  (Abnormal) Collected: 06/02/24 0721   Lab Status: Final result Specimen: Blood from Venous Updated: 06/02/24 9188   Narrative:     The following orders were created for panel order CBC with Differential. Procedure                               Abnormality         Status                    ---------                               -----------         ------                    CBC with Differential[[id number]]       Abnormal            Final result               Please view results for these tests on the individual orders.   Magnesium  [[id number]]  (Normal) Collected: 06/02/24 0721   Lab Status: Final result Specimen: Blood from Venous Updated: 06/02/24 0832    Magnesium  2.1 1.9 - 2.7 mg/dL    CBC with Differential [[id number]]  (Abnormal) Collected: 06/02/24 0721   Lab Status: Final result Specimen: Blood from Venous Updated: 06/02/24 0811    WBC 7.10 4.40 - 11.00 10*3/uL     RBC 4.46 4.10 - 5.10 10*6/uL     Hemoglobin 13.1 12.3 - 15.3 g/dL     Hematocrit 59.8 64.0 - 44.6 %     Mean Corpuscular Volume (MCV) 89.8 80.0 - 96.0 fL     Mean Corpuscular Hemoglobin (MCH) 29.5 27.5 - 33.2 pg     Mean Corpuscular Hemoglobin Conc (MCHC) 32.8* 33.0 - 37.0 g/dL     Red Cell Distribution Width (RDW) 13.4 12.3 - 17.0 %     Platelet Count (PLT) 128* 150 - 450 10*3/uL     Mean Platelet Volume (MPV) 9.0 6.8 - 10.2 fL     Neutrophils % 61 %     Lymphocytes % 25 %     Monocytes % 10 %     Eosinophils % 4 %     Basophils % 1 %     nRBC % 0 %  Neutrophils Absolute 4.30 1.80 - 7.80 10*3/uL     Lymphocytes # 1.80 1.00 - 4.80 10*3/uL     Monocytes # 0.70 0.00 - 0.80 10*3/uL     Eosinophils # 0.20 0.00 - 0.50 10*3/uL     Basophils # 0.00 0.00 - 0.20 10*3/uL     nRBC Absolute 0.00 <=0.00 10*3/uL         Medications reviewed: Yes  Billing and Medical Decision  Making Medical Decision Making contribution : During today's visit, I personally addressed the following that applies toward Medical Decision Making:Complexity: The following are high risk issues for medical decision making in this patient: Chronic illness with severe exacerbation Illness that poses threat to life or body function Change in neuro status  Reviewed and/or ordered the following:EHR notes EHR labs Face to face ACP time spent: 0 minutes   *Some images could not be shown.

## 2024-06-05 ENCOUNTER — Telehealth: Payer: Self-pay

## 2024-06-05 NOTE — Transitions of Care (Post Inpatient/ED Visit) (Signed)
" ° °  06/05/2024  Name: Darlene [name] MRN: [id number] DOB: [birth year]  Today's TOC FU Call Status: Today's TOC FU Call Status:: Unsuccessful Call (1st Attempt) Unsuccessful Call (1st Attempt) Date: 06/05/24  Attempted to reach the patient regarding the most recent Inpatient/ED visit.  Follow Up Plan: Additional outreach attempts will be made to reach the patient to complete the Transitions of Care (Post Inpatient/ED visit) call.   Mardy Lucier J. Vishwa Dais RN, MSN [facility], [facility] RN Care Manager Direct Dial: [phone]  Fax: [phone] Website: [email or web address]   "

## 2024-06-05 NOTE — Progress Notes (Signed)
 Case Management Discharge Note        CSN: [id number] DOB: Jan 15, 1935 Service: Oncology Location: C729/A  Patient Class: Inpatient  DC Disposition: : Home Health Care  Discharge DC Disposition: : Home Health Care Homecare Referral: (SW) Social Work, (PT) Physical Therapy, (OT) Occupational Therapy, (SN) Skilled Nursing Homecare/Hospice Agency(s) chosen: [facility]  Discharge Referrals Patient Preference: Chosen geographical local area/county shared with patient/family: Yes Patient Preference for Post-Acute Provider Form completed: Yes Case closed, patient/family agree with disposition plan: Yes   Montie Bramble, MSW Social Worker [phone]

## 2024-06-07 ENCOUNTER — Telehealth: Payer: Self-pay

## 2024-06-07 NOTE — Transitions of Care (Post Inpatient/ED Visit) (Signed)
" ° °  06/07/2024  Name: Darlene [name] MRN: [id number] DOB: 1934/09/06  Today's TOC FU Call Status: Today's TOC FU Call Status:: Unsuccessful Call (2nd Attempt) Unsuccessful Call (2nd Attempt) Date: 06/07/24  Attempted to reach the patient regarding the most recent Inpatient/ED visit.  Follow Up Plan: Additional outreach attempts will be made to reach the patient to complete the Transitions of Care (Post Inpatient/ED visit) call.   Bellami Farrelly J. Taunja Brickner RN, MSN [facility], [facility] RN Care Manager Direct Dial: [phone]  Fax: [phone] Website: [email or web address]   "

## 2024-06-08 ENCOUNTER — Telehealth: Payer: Self-pay

## 2024-06-08 NOTE — Telephone Encounter (Signed)
 Next available 2/23, schedule 40 mins

## 2024-06-08 NOTE — Telephone Encounter (Signed)
 Returned call to daughter. Her Mom is home now after being hospitalized at [facility].  She is requesting appt with Dr. Rick.

## 2024-06-08 NOTE — Telephone Encounter (Signed)
 Called and scheduled appt on 2/23 at 1140, daughter is aware of appt.

## 2024-06-26 ENCOUNTER — Inpatient Hospital Stay: Admitting: Hematology and Oncology

## 2024-10-12 ENCOUNTER — Inpatient Hospital Stay

## 2024-10-12 ENCOUNTER — Inpatient Hospital Stay: Admitting: Hematology and Oncology
